# Patient Record
Sex: Female | Born: 1935 | Race: White | Hispanic: No | Marital: Married | Smoking: Never smoker
Health system: Southern US, Community
[De-identification: ages and names within clinical notes are randomized; demographics above are authoritative.]

## PROBLEM LIST (undated history)

## (undated) DIAGNOSIS — S22080A Wedge compression fracture of T11-T12 vertebra, initial encounter for closed fracture: Secondary | ICD-10-CM

## (undated) DIAGNOSIS — L989 Disorder of the skin and subcutaneous tissue, unspecified: Secondary | ICD-10-CM

## (undated) DIAGNOSIS — M8580 Other specified disorders of bone density and structure, unspecified site: Secondary | ICD-10-CM

## (undated) DIAGNOSIS — S7290XA Unspecified fracture of unspecified femur, initial encounter for closed fracture: Secondary | ICD-10-CM

## (undated) DIAGNOSIS — N289 Disorder of kidney and ureter, unspecified: Secondary | ICD-10-CM

## (undated) DIAGNOSIS — I639 Cerebral infarction, unspecified: Secondary | ICD-10-CM

## (undated) DIAGNOSIS — K922 Gastrointestinal hemorrhage, unspecified: Secondary | ICD-10-CM

## (undated) DIAGNOSIS — I2510 Atherosclerotic heart disease of native coronary artery without angina pectoris: Secondary | ICD-10-CM

## (undated) DIAGNOSIS — M1990 Unspecified osteoarthritis, unspecified site: Secondary | ICD-10-CM

## (undated) DIAGNOSIS — L8990 Pressure ulcer of unspecified site, unspecified stage: Secondary | ICD-10-CM

## (undated) DIAGNOSIS — I10 Essential (primary) hypertension: Secondary | ICD-10-CM

## (undated) DIAGNOSIS — I255 Ischemic cardiomyopathy: Secondary | ICD-10-CM

## (undated) DIAGNOSIS — E785 Hyperlipidemia, unspecified: Secondary | ICD-10-CM

## (undated) DIAGNOSIS — R1310 Dysphagia, unspecified: Secondary | ICD-10-CM

## (undated) DIAGNOSIS — I671 Cerebral aneurysm, nonruptured: Secondary | ICD-10-CM

## (undated) DIAGNOSIS — F0280 Dementia in other diseases classified elsewhere without behavioral disturbance: Secondary | ICD-10-CM

## (undated) DIAGNOSIS — R350 Frequency of micturition: Secondary | ICD-10-CM

## (undated) DIAGNOSIS — E119 Type 2 diabetes mellitus without complications: Secondary | ICD-10-CM

## (undated) DIAGNOSIS — M6281 Muscle weakness (generalized): Secondary | ICD-10-CM

## (undated) DIAGNOSIS — R569 Unspecified convulsions: Secondary | ICD-10-CM

## (undated) DIAGNOSIS — I8290 Acute embolism and thrombosis of unspecified vein: Secondary | ICD-10-CM

## (undated) DIAGNOSIS — I5042 Chronic combined systolic (congestive) and diastolic (congestive) heart failure: Secondary | ICD-10-CM

## (undated) DIAGNOSIS — H539 Unspecified visual disturbance: Secondary | ICD-10-CM

## (undated) DIAGNOSIS — K5730 Diverticulosis of large intestine without perforation or abscess without bleeding: Secondary | ICD-10-CM

## (undated) DIAGNOSIS — G4730 Sleep apnea, unspecified: Secondary | ICD-10-CM

## (undated) HISTORY — DX: Wedge compression fracture of T11-T12 vertebra, initial encounter for closed fracture: S22.080A

## (undated) HISTORY — DX: Diverticulosis of large intestine without perforation or abscess without bleeding: K57.30

## (undated) HISTORY — DX: Other specified disorders of bone density and structure, unspecified site: M85.80

## (undated) HISTORY — PX: DOPPLER ECHOCARDIOGRAPHY: SHX263

## (undated) HISTORY — DX: Unspecified visual disturbance: H53.9

## (undated) HISTORY — DX: Ischemic cardiomyopathy: I25.5

## (undated) HISTORY — DX: Unspecified osteoarthritis, unspecified site: M19.90

## (undated) HISTORY — PX: CARDIAC CATHETERIZATION: SHX172

## (undated) HISTORY — DX: Atherosclerotic heart disease of native coronary artery without angina pectoris: I25.10

## (undated) HISTORY — PX: THROMBECTOMY: PRO61

## (undated) HISTORY — DX: Acute embolism and thrombosis of unspecified vein: I82.90

## (undated) HISTORY — PX: OTHER SURGICAL HISTORY: SHX169

## (undated) HISTORY — DX: Type 2 diabetes mellitus without complications: E11.9

## (undated) HISTORY — DX: Sleep apnea, unspecified: G47.30

## (undated) HISTORY — DX: Essential (primary) hypertension: I10

## (undated) HISTORY — PX: TONSILLECTOMY: SUR1361

## (undated) HISTORY — DX: Disorder of the skin and subcutaneous tissue, unspecified: L98.9

## (undated) HISTORY — PX: APPENDECTOMY: SHX54

## (undated) HISTORY — DX: Hyperlipidemia, unspecified: E78.5

---

## 1978-08-26 HISTORY — PX: ABDOMINAL HYSTERECTOMY: SHX81

## 1998-03-15 ENCOUNTER — Other Ambulatory Visit: Admission: RE | Admit: 1998-03-15 | Discharge: 1998-03-15 | Payer: Self-pay | Admitting: Family Medicine

## 1998-03-17 ENCOUNTER — Other Ambulatory Visit: Admission: RE | Admit: 1998-03-17 | Discharge: 1998-03-17 | Payer: Self-pay | Admitting: Family Medicine

## 1998-10-27 ENCOUNTER — Emergency Department (HOSPITAL_COMMUNITY): Admission: EM | Admit: 1998-10-27 | Discharge: 1998-10-27 | Payer: Self-pay | Admitting: Emergency Medicine

## 1998-10-27 ENCOUNTER — Encounter: Payer: Self-pay | Admitting: Emergency Medicine

## 1999-10-23 ENCOUNTER — Other Ambulatory Visit: Admission: RE | Admit: 1999-10-23 | Discharge: 1999-10-23 | Payer: Self-pay | Admitting: Family Medicine

## 2000-05-27 ENCOUNTER — Emergency Department (HOSPITAL_COMMUNITY): Admission: EM | Admit: 2000-05-27 | Discharge: 2000-05-27 | Payer: Self-pay | Admitting: Emergency Medicine

## 2000-05-27 ENCOUNTER — Encounter: Payer: Self-pay | Admitting: Emergency Medicine

## 2000-08-26 DIAGNOSIS — I639 Cerebral infarction, unspecified: Secondary | ICD-10-CM

## 2000-08-26 HISTORY — DX: Cerebral infarction, unspecified: I63.9

## 2000-10-02 ENCOUNTER — Encounter (INDEPENDENT_AMBULATORY_CARE_PROVIDER_SITE_OTHER): Payer: Self-pay | Admitting: Specialist

## 2000-10-02 ENCOUNTER — Encounter: Payer: Self-pay | Admitting: Emergency Medicine

## 2000-10-02 ENCOUNTER — Inpatient Hospital Stay (HOSPITAL_COMMUNITY): Admission: EM | Admit: 2000-10-02 | Discharge: 2000-10-03 | Payer: Self-pay | Admitting: Emergency Medicine

## 2001-06-02 ENCOUNTER — Encounter: Payer: Self-pay | Admitting: Neurology

## 2001-06-02 ENCOUNTER — Encounter: Payer: Self-pay | Admitting: Emergency Medicine

## 2001-06-02 ENCOUNTER — Inpatient Hospital Stay (HOSPITAL_COMMUNITY): Admission: EM | Admit: 2001-06-02 | Discharge: 2001-06-04 | Payer: Self-pay | Admitting: Emergency Medicine

## 2001-06-08 ENCOUNTER — Encounter: Payer: Self-pay | Admitting: Emergency Medicine

## 2001-06-08 ENCOUNTER — Emergency Department (HOSPITAL_COMMUNITY): Admission: EM | Admit: 2001-06-08 | Discharge: 2001-06-08 | Payer: Self-pay | Admitting: Emergency Medicine

## 2003-04-26 ENCOUNTER — Encounter: Admission: RE | Admit: 2003-04-26 | Discharge: 2003-07-25 | Payer: Self-pay | Admitting: Endocrinology

## 2003-06-02 ENCOUNTER — Encounter: Payer: Self-pay | Admitting: Endocrinology

## 2003-06-02 ENCOUNTER — Encounter: Admission: RE | Admit: 2003-06-02 | Discharge: 2003-06-02 | Payer: Self-pay | Admitting: Endocrinology

## 2003-09-07 ENCOUNTER — Encounter (HOSPITAL_BASED_OUTPATIENT_CLINIC_OR_DEPARTMENT_OTHER): Admission: RE | Admit: 2003-09-07 | Discharge: 2003-12-06 | Payer: Self-pay | Admitting: Internal Medicine

## 2003-12-07 ENCOUNTER — Encounter (HOSPITAL_BASED_OUTPATIENT_CLINIC_OR_DEPARTMENT_OTHER): Admission: RE | Admit: 2003-12-07 | Discharge: 2003-12-20 | Payer: Self-pay | Admitting: Internal Medicine

## 2004-08-06 ENCOUNTER — Ambulatory Visit: Payer: Self-pay | Admitting: Internal Medicine

## 2005-03-27 ENCOUNTER — Inpatient Hospital Stay (HOSPITAL_COMMUNITY): Admission: AD | Admit: 2005-03-27 | Discharge: 2005-03-29 | Payer: Self-pay | Admitting: Internal Medicine

## 2005-03-27 ENCOUNTER — Ambulatory Visit: Payer: Self-pay | Admitting: Internal Medicine

## 2005-04-09 ENCOUNTER — Ambulatory Visit: Payer: Self-pay | Admitting: Internal Medicine

## 2005-04-17 ENCOUNTER — Ambulatory Visit: Payer: Self-pay | Admitting: Family Medicine

## 2005-05-21 ENCOUNTER — Ambulatory Visit: Payer: Self-pay | Admitting: Internal Medicine

## 2005-07-15 ENCOUNTER — Ambulatory Visit: Payer: Self-pay | Admitting: Internal Medicine

## 2005-10-10 ENCOUNTER — Ambulatory Visit (HOSPITAL_BASED_OUTPATIENT_CLINIC_OR_DEPARTMENT_OTHER): Admission: RE | Admit: 2005-10-10 | Discharge: 2005-10-11 | Payer: Self-pay | Admitting: Orthopaedic Surgery

## 2005-10-15 ENCOUNTER — Ambulatory Visit: Payer: Self-pay | Admitting: Internal Medicine

## 2005-10-24 ENCOUNTER — Encounter (HOSPITAL_COMMUNITY): Admission: RE | Admit: 2005-10-24 | Discharge: 2005-11-23 | Payer: Self-pay | Admitting: Orthopaedic Surgery

## 2005-11-13 ENCOUNTER — Ambulatory Visit (HOSPITAL_COMMUNITY): Admission: RE | Admit: 2005-11-13 | Discharge: 2005-11-13 | Payer: Self-pay | Admitting: Gastroenterology

## 2005-11-25 ENCOUNTER — Encounter (HOSPITAL_COMMUNITY): Admission: RE | Admit: 2005-11-25 | Discharge: 2005-12-25 | Payer: Self-pay | Admitting: Orthopaedic Surgery

## 2005-12-26 ENCOUNTER — Encounter (HOSPITAL_COMMUNITY): Admission: RE | Admit: 2005-12-26 | Discharge: 2006-01-25 | Payer: Self-pay | Admitting: Orthopaedic Surgery

## 2007-04-25 ENCOUNTER — Encounter: Payer: Self-pay | Admitting: Internal Medicine

## 2007-04-25 DIAGNOSIS — I10 Essential (primary) hypertension: Secondary | ICD-10-CM

## 2007-04-25 DIAGNOSIS — M949 Disorder of cartilage, unspecified: Secondary | ICD-10-CM

## 2007-04-25 DIAGNOSIS — M899 Disorder of bone, unspecified: Secondary | ICD-10-CM | POA: Insufficient documentation

## 2007-04-25 DIAGNOSIS — Z8719 Personal history of other diseases of the digestive system: Secondary | ICD-10-CM | POA: Insufficient documentation

## 2007-04-25 DIAGNOSIS — K5730 Diverticulosis of large intestine without perforation or abscess without bleeding: Secondary | ICD-10-CM | POA: Insufficient documentation

## 2007-04-25 DIAGNOSIS — M1990 Unspecified osteoarthritis, unspecified site: Secondary | ICD-10-CM | POA: Insufficient documentation

## 2007-07-14 ENCOUNTER — Encounter: Payer: Self-pay | Admitting: Internal Medicine

## 2007-09-10 ENCOUNTER — Encounter: Payer: Self-pay | Admitting: Internal Medicine

## 2007-11-24 ENCOUNTER — Encounter: Payer: Self-pay | Admitting: Internal Medicine

## 2008-01-07 ENCOUNTER — Ambulatory Visit: Payer: Self-pay | Admitting: Internal Medicine

## 2008-01-07 DIAGNOSIS — R5381 Other malaise: Secondary | ICD-10-CM

## 2008-01-07 DIAGNOSIS — R252 Cramp and spasm: Secondary | ICD-10-CM

## 2008-01-07 DIAGNOSIS — E119 Type 2 diabetes mellitus without complications: Secondary | ICD-10-CM | POA: Insufficient documentation

## 2008-01-07 DIAGNOSIS — E785 Hyperlipidemia, unspecified: Secondary | ICD-10-CM

## 2008-01-07 DIAGNOSIS — R5383 Other fatigue: Secondary | ICD-10-CM

## 2008-02-17 ENCOUNTER — Encounter: Payer: Self-pay | Admitting: Internal Medicine

## 2008-04-12 ENCOUNTER — Telehealth: Payer: Self-pay | Admitting: Internal Medicine

## 2008-10-06 ENCOUNTER — Encounter (INDEPENDENT_AMBULATORY_CARE_PROVIDER_SITE_OTHER): Payer: Self-pay | Admitting: *Deleted

## 2009-01-17 ENCOUNTER — Ambulatory Visit: Payer: Self-pay | Admitting: Internal Medicine

## 2009-01-25 LAB — CONVERTED CEMR LAB
Bilirubin, Direct: 0.1 mg/dL (ref 0.0–0.3)
Calcium: 9.6 mg/dL (ref 8.4–10.5)
Chloride: 105 meq/L (ref 96–112)
Cholesterol: 208 mg/dL — ABNORMAL HIGH (ref 0–200)
Creatinine, Ser: 0.9 mg/dL (ref 0.4–1.2)
Direct LDL: 118.5 mg/dL
Eosinophils Absolute: 0.1 10*3/uL (ref 0.0–0.7)
Eosinophils Relative: 2.3 % (ref 0.0–5.0)
GFR calc non Af Amer: 63.9 mL/min (ref >60)
Glucose, Bld: 133 mg/dL — ABNORMAL HIGH (ref 70–99)
HCT: 37.8 % (ref 36.0–46.0)
HDL: 56.8 mg/dL (ref >39.00)
Hemoglobin: 13.1 g/dL (ref 12.0–15.0)
Ketones, ur: NEGATIVE mg/dL
Lymphocytes Relative: 28.7 % (ref 12.0–46.0)
Lymphs Abs: 1.5 10*3/uL (ref 0.7–4.0)
MCHC: 34.6 g/dL (ref 30.0–36.0)
MCV: 89.9 fL (ref 78.0–100.0)
Neutro Abs: 3.2 10*3/uL (ref 1.4–7.7)
Nitrite: NEGATIVE
Platelets: 203 10*3/uL (ref 150.0–400.0)
Potassium: 3.7 meq/L (ref 3.5–5.1)
RBC: 4.21 M/uL (ref 3.87–5.11)
Specific Gravity, Urine: 1.01 (ref 1.000–1.030)
Total Bilirubin: 1 mg/dL (ref 0.3–1.2)
Total CHOL/HDL Ratio: 4
Total Protein, Urine: NEGATIVE mg/dL
Total Protein: 6.9 g/dL (ref 6.0–8.3)
Urobilinogen, UA: 0.2 (ref 0.0–1.0)
pH: 6.5 (ref 5.0–8.0)

## 2009-02-24 ENCOUNTER — Ambulatory Visit: Payer: Self-pay | Admitting: Internal Medicine

## 2009-04-17 ENCOUNTER — Ambulatory Visit: Payer: Self-pay | Admitting: Internal Medicine

## 2009-04-17 DIAGNOSIS — R05 Cough: Secondary | ICD-10-CM

## 2009-05-22 ENCOUNTER — Ambulatory Visit: Payer: Self-pay | Admitting: Internal Medicine

## 2009-05-31 ENCOUNTER — Ambulatory Visit: Payer: Self-pay | Admitting: Internal Medicine

## 2009-05-31 LAB — CONVERTED CEMR LAB
Basophils Absolute: 0.1 10*3/uL (ref 0.0–0.1)
CO2: 28 meq/L (ref 19–32)
Creatinine, Ser: 1 mg/dL (ref 0.4–1.2)
GFR calc non Af Amer: 56.54 mL/min (ref >60)
HCT: 37.3 % (ref 36.0–46.0)
Lymphocytes Relative: 28.2 % (ref 12.0–46.0)
MCV: 90.7 fL (ref 78.0–100.0)
Neutrophils Relative %: 57.7 % (ref 43.0–77.0)
Platelets: 211 10*3/uL (ref 150.0–400.0)
Potassium: 4.1 meq/L (ref 3.5–5.1)
RDW: 12.8 % (ref 11.5–14.6)
Sodium: 142 meq/L (ref 135–145)
WBC: 7.3 10*3/uL (ref 4.5–10.5)

## 2009-06-05 ENCOUNTER — Encounter: Payer: Self-pay | Admitting: Internal Medicine

## 2009-07-10 ENCOUNTER — Encounter: Payer: Self-pay | Admitting: Internal Medicine

## 2009-12-19 ENCOUNTER — Telehealth: Payer: Self-pay | Admitting: Internal Medicine

## 2009-12-29 ENCOUNTER — Encounter (INDEPENDENT_AMBULATORY_CARE_PROVIDER_SITE_OTHER): Payer: Self-pay | Admitting: *Deleted

## 2010-01-31 ENCOUNTER — Telehealth: Payer: Self-pay | Admitting: Internal Medicine

## 2010-03-19 ENCOUNTER — Ambulatory Visit: Payer: Self-pay | Admitting: Internal Medicine

## 2010-03-19 DIAGNOSIS — R079 Chest pain, unspecified: Secondary | ICD-10-CM

## 2010-03-19 DIAGNOSIS — R209 Unspecified disturbances of skin sensation: Secondary | ICD-10-CM

## 2010-03-20 ENCOUNTER — Ambulatory Visit: Payer: Self-pay | Admitting: Internal Medicine

## 2010-03-20 ENCOUNTER — Telehealth: Payer: Self-pay | Admitting: Internal Medicine

## 2010-03-20 DIAGNOSIS — E538 Deficiency of other specified B group vitamins: Secondary | ICD-10-CM

## 2010-03-20 LAB — CONVERTED CEMR LAB
ALT: 17 units/L (ref 0–35)
AST: 17 units/L (ref 0–37)
Albumin: 3.8 g/dL (ref 3.5–5.2)
Alkaline Phosphatase: 68 units/L (ref 39–117)
Basophils Relative: 0.8 % (ref 0.0–3.0)
Calcium: 9.3 mg/dL (ref 8.4–10.5)
Creatinine, Ser: 0.7 mg/dL (ref 0.4–1.2)
Eosinophils Absolute: 0.1 10*3/uL (ref 0.0–0.7)
GFR calc non Af Amer: 79.87 mL/min (ref >60)
Glucose, Bld: 138 mg/dL — ABNORMAL HIGH (ref 70–99)
HDL: 64.4 mg/dL (ref >39.00)
MCHC: 34.4 g/dL (ref 30.0–36.0)
Monocytes Relative: 9.9 % (ref 3.0–12.0)
Neutro Abs: 3.7 10*3/uL (ref 1.4–7.7)
Nitrite: NEGATIVE
Platelets: 198 10*3/uL (ref 150.0–400.0)
RBC: 4.22 M/uL (ref 3.87–5.11)
RDW: 13.5 % (ref 11.5–14.6)
Sodium: 138 meq/L (ref 135–145)
Specific Gravity, Urine: >=1.03 (ref 1.000–1.030)
Triglycerides: 141 mg/dL (ref 0.0–149.0)
Uric Acid, Serum: 4.5 mg/dL (ref 2.4–7.0)
Vitamin B-12: 161 pg/mL — ABNORMAL LOW (ref 211–911)
WBC: 6.4 10*3/uL (ref 4.5–10.5)

## 2010-03-21 ENCOUNTER — Encounter (INDEPENDENT_AMBULATORY_CARE_PROVIDER_SITE_OTHER): Payer: Self-pay | Admitting: *Deleted

## 2010-03-21 ENCOUNTER — Telehealth (INDEPENDENT_AMBULATORY_CARE_PROVIDER_SITE_OTHER): Payer: Self-pay | Admitting: *Deleted

## 2010-03-22 ENCOUNTER — Ambulatory Visit: Payer: Self-pay

## 2010-03-22 ENCOUNTER — Ambulatory Visit: Payer: Self-pay | Admitting: Cardiology

## 2010-03-22 ENCOUNTER — Encounter: Payer: Self-pay | Admitting: Cardiology

## 2010-03-22 ENCOUNTER — Encounter (HOSPITAL_COMMUNITY): Admission: RE | Admit: 2010-03-22 | Discharge: 2010-06-20 | Payer: Self-pay | Admitting: Internal Medicine

## 2010-03-23 ENCOUNTER — Ambulatory Visit: Payer: Self-pay | Admitting: Internal Medicine

## 2010-03-30 ENCOUNTER — Encounter: Payer: Self-pay | Admitting: Internal Medicine

## 2010-04-02 ENCOUNTER — Encounter: Payer: Self-pay | Admitting: Internal Medicine

## 2010-04-04 ENCOUNTER — Telehealth: Payer: Self-pay | Admitting: Internal Medicine

## 2010-04-04 ENCOUNTER — Ambulatory Visit: Payer: Self-pay | Admitting: Internal Medicine

## 2010-04-06 ENCOUNTER — Ambulatory Visit: Payer: Self-pay | Admitting: Internal Medicine

## 2010-04-06 ENCOUNTER — Telehealth: Payer: Self-pay | Admitting: Internal Medicine

## 2010-04-09 ENCOUNTER — Ambulatory Visit: Payer: Self-pay | Admitting: Internal Medicine

## 2010-04-12 ENCOUNTER — Ambulatory Visit: Payer: Self-pay | Admitting: Internal Medicine

## 2010-04-16 ENCOUNTER — Ambulatory Visit: Payer: Self-pay | Admitting: Internal Medicine

## 2010-04-20 ENCOUNTER — Ambulatory Visit: Payer: Self-pay | Admitting: Internal Medicine

## 2010-05-03 ENCOUNTER — Encounter (INDEPENDENT_AMBULATORY_CARE_PROVIDER_SITE_OTHER): Payer: Self-pay | Admitting: *Deleted

## 2010-05-03 ENCOUNTER — Ambulatory Visit: Payer: Self-pay | Admitting: Internal Medicine

## 2010-05-10 ENCOUNTER — Ambulatory Visit: Payer: Self-pay | Admitting: Internal Medicine

## 2010-05-10 ENCOUNTER — Inpatient Hospital Stay (HOSPITAL_COMMUNITY): Admission: EM | Admit: 2010-05-10 | Discharge: 2010-05-18 | Payer: Self-pay | Admitting: Emergency Medicine

## 2010-05-11 ENCOUNTER — Encounter (INDEPENDENT_AMBULATORY_CARE_PROVIDER_SITE_OTHER): Payer: Self-pay | Admitting: Cardiovascular Disease

## 2010-05-11 HISTORY — PX: CORONARY ANGIOPLASTY WITH STENT PLACEMENT: SHX49

## 2010-05-17 ENCOUNTER — Encounter (INDEPENDENT_AMBULATORY_CARE_PROVIDER_SITE_OTHER): Payer: Self-pay | Admitting: Cardiology

## 2010-05-20 ENCOUNTER — Emergency Department (HOSPITAL_COMMUNITY): Admission: EM | Admit: 2010-05-20 | Discharge: 2010-05-20 | Payer: Self-pay | Admitting: Emergency Medicine

## 2010-05-28 ENCOUNTER — Ambulatory Visit: Payer: Self-pay | Admitting: Internal Medicine

## 2010-05-28 DIAGNOSIS — R1110 Vomiting, unspecified: Secondary | ICD-10-CM

## 2010-05-29 ENCOUNTER — Encounter: Payer: Self-pay | Admitting: Internal Medicine

## 2010-07-02 ENCOUNTER — Encounter: Payer: Self-pay | Admitting: Internal Medicine

## 2010-07-14 ENCOUNTER — Emergency Department (HOSPITAL_COMMUNITY): Admission: EM | Admit: 2010-07-14 | Discharge: 2010-07-14 | Payer: Self-pay | Admitting: Emergency Medicine

## 2010-07-17 ENCOUNTER — Ambulatory Visit: Payer: Self-pay | Admitting: Internal Medicine

## 2010-07-23 ENCOUNTER — Encounter: Payer: Self-pay | Admitting: Internal Medicine

## 2010-07-24 ENCOUNTER — Ambulatory Visit: Payer: Self-pay | Admitting: Internal Medicine

## 2010-07-24 LAB — CONVERTED CEMR LAB: Blood Glucose, Fingerstick: 181

## 2010-08-07 ENCOUNTER — Telehealth: Payer: Self-pay | Admitting: Internal Medicine

## 2010-08-23 ENCOUNTER — Encounter: Payer: Self-pay | Admitting: Internal Medicine

## 2010-09-25 NOTE — Letter (Signed)
Summary: Generic Letter  [facility]  [address] [place], [place] [address]   Phone: [phone]  Fax: [phone]    05/29/2010  RE: SILAS SEDAM [address] [place], [place]  [address]  Isabelle Course! I hope it is OK with you that I stopped Plavix and started Effient on Ms. Worley - she seems to have  a lot of n/v from Plavix.   Thank you!        Sincerely,   Jacinta Shoe MD

## 2010-09-25 NOTE — Assessment & Plan Note (Signed)
Summary: b12/SD   Nurse Visit   Allergies: 1)  Lisinopril (Lisinopril)  Medication Administration  Injection # 1:    Medication: Vit [name] 1000 mcg    Diagnosis: VITAMIN [name] DEFICIENCY (ICD-266.2)    Route: IM    Site: L deltoid    Exp Date: 11/25/2011    Lot #: [id number]    Mfr: [employer]    Patient tolerated injection without complications    Given by: Lanier Prude, CMA(AAMA) (March 23, 2010 9:47 AM)  Orders Added: 1)  Admin of Therapeutic Inj  intramuscular or subcutaneous [96372] 2)  Vit [name] 1000 mcg [J3420]

## 2010-09-25 NOTE — Progress Notes (Signed)
Summary: Nuclear Pre-Procedure  Phone Note Outgoing Call Call back at [facility] Phone [phone]   Call placed by: Stanton Kidney, EMT-P,  March 21, 2010 1:51 PM Action Taken: Phone Call Completed Summary of Call: Left message with information on Myoview Information Sheet (see scanned document for details).     Nuclear Med Background Indications for Stress Test: Evaluation for Ischemia     Symptoms: Chest Pain    Nuclear Pre-Procedure Cardiac Risk Factors: Hypertension, Lipids, NIDDM Height (in): 65

## 2010-09-25 NOTE — Assessment & Plan Note (Signed)
Summary: FU/ MAY NEED LABS AFTER APPT /NWS  #   Vital Signs:  Patient profile:   77 year old female Height:      65 inches Weight:      193 pounds BMI:     32.23 O2 Sat:      93 % on Room air Temp:     98.4 degrees F oral Pulse rate:   70 / minute Pulse rhythm:   regular Resp:     14 per minute BP sitting:   140 / 68  (left arm) Cuff size:   regular  Vitals Entered By: Lanier Prude, CMA(AAMA) (March 19, 2010 9:13 AM)  O2 Flow:  Room air CC: f/u Is Patient Diabetic? Yes   Primary Care Provider:  Tresa Garter MD  CC:  f/u.  History of Present Illness: The patient presents for a follow up of hypertension, diabetes, hyperlipidemia. C/o neck pain x long time C/o CP The patient presents for a wellness examination  Patient past medical history, social history, and family history reviewed in detail no significant changes.  Patient is physically active. Depression is negative and mood is good. Hearing is normal, and able to perform activities of daily living. Risk of falling is negligible and home safety has been reviewed and is appropriate. Patient has normal height, weight, and visual acuity. Patient has been counseled on age-appropriate routine health concerns for screening and prevention. Education, counseling done.    Current Medications (verified): 1)  Norvasc 10 Mg  Tabs (Amlodipine Besylate) .... Once Daily 2)  Prilosec 20 Mg  Cpdr (Omeprazole) .... Once Daily 3)  Glucophage Xr 500 Mg  Tb24 (Metformin Hcl) .... 2 Tabsonce Daily 4)  Glyburide 5 Mg  Tabs (Glyburide) .Marland Kitchen.. 1 By Mouth Qd 5)  Freestyle Lite Test   Strp (Glucose Blood) .... Check Once Daily Prn 6)  Freestyle Unistick Ii Lancets   Misc (Lancets) .... Once Daily Prn 7)  Meclizine Hcl 12.5 Mg Tabs (Meclizine Hcl) .Marland Kitchen.. 1-2 By Mouth Qid As Needed Dizziness 8)  Bayer Low Strength 81 Mg  Tbec (Aspirin) .... Once Daily 9)  Fluticasone Propionate 50 Mcg/act  Susp (Fluticasone Propionate) .... 2 Sprays Each Nostril  Once Daily 10)  Advair Diskus 250-50 Mcg/dose Aepb (Fluticasone-Salmeterol) .Marland Kitchen.. 1 Inh Bid 11)  Promethazine-Codeine 6.25-10 Mg/71ml Syrp (Promethazine-Codeine) .... 5-10 Ml By Mouth Q Id As Needed Cough  Allergies (verified): 1)  Lisinopril (Lisinopril)  Past History:  Past Medical History: Last updated: 01/07/2008 Hypertension Osteoarthritis Osteopenia Diverticulosis, colon Diabetes mellitus, type II Hyperlipidemia  Past Surgical History: Last updated: 04/25/2007 Hysterectomy  1980?  no bso  Family History: Last updated: 01/07/2008 Family History Hypertension  Social History: Last updated: 05/22/2009 Retired Married Never Smoked, but worked in [employer] Alcohol use-no Drug use-no Regular exercise-no  Review of Systems       The patient complains of difficulty walking.  The patient denies weight gain, chest pain, syncope, abdominal pain, melena, anorexia, fever, weight loss, vision loss, decreased hearing, hoarseness, dyspnea on exertion, peripheral edema, prolonged cough, headaches, hemoptysis, hematochezia, severe indigestion/heartburn, hematuria, incontinence, genital sores, muscle weakness, suspicious skin lesions, transient blindness, depression, unusual weight change, abnormal bleeding, enlarged lymph nodes, angioedema, and breast masses.         Lost wt on diet. Tired  Physical Exam  General:  NAD overweight-appearing.   Head:  Normocephalic and atraumatic without obvious abnormalities. No apparent alopecia or balding. Eyes:  No corneal or conjunctival inflammation noted. EOMI. Perrla.  Ears:  External ear exam shows no significant lesions or deformities.  Otoscopic examination reveals clear canals, tympanic membranes are intact bilaterally without bulging, retraction, inflammation or discharge. Hearing is grossly normal bilaterally. Nose:  External nasal examination shows no deformity or inflammation. Nasal mucosa are pink and moist without lesions or  exudates. Mouth:  Oral mucosa and oropharynx without lesions or exudates.  Teeth in good repair. Neck:  No deformities, masses, or tenderness noted. Lungs:  Normal respiratory effort, chest expands symmetrically. Lungs are clear to auscultation, no crackles or wheezes. Heart:  Normal rate and regular rhythm. S1 and S2 normal without gallop, murmur, click, rub or other extra sounds. Abdomen:  Bowel sounds positive,abdomen soft and non-tender without masses, organomegaly or hernias noted. Msk:  WNL Pulses:  R and L carotid,radial,femoral,dorsalis pedis and posterior tibial pulses are full and equal bilaterally Neurologic:  No cranial nerve deficits noted. Station and gait are normal. Plantar reflexes are down-going bilaterally. DTRs are symmetrical throughout. Sensory, motor and coordinative functions appear intact. Skin:  Intact without suspicious lesions or rashes Psych:  Cognition and judgment appear intact. Alert and cooperative with normal attention span and concentration. No apparent delusions, illusions, hallucinations   Impression & Recommendations:  Problem # 1:  PHYSICAL EXAMINATION (ICD-V70.0) Assessment Unchanged  Overall doing well, age appropriate education and counseling updated and referral for appropriate preventive services done unless declined, immunizations up to date or declined, diet counseling done if overweight, urged to quit smoking if smokes, most recent labs reviewed and current ordered if appropriate, ecg reviewed or declined (interpretation per ECG scanned in the EMR if done); information regarding Medicare Preventation requirements given if appropriate.   Orders: EKG w/ Interpretation (93000) nl T-Vitamin D (25-Hydroxy) [phone]) Gastroenterology Referral (GI) colon Radiology Referral (Radiology) TLB-BMP (Basic Metabolic Panel-BMET) (80048-METABOL) TLB-[name], Serum-Total ONLY (29562-Z30) TLB-CBC Platelet - w/Differential (85025-CBCD) TLB-Hepatic/Liver  Function Pnl (80076-HEPATIC) TLB-Lipid Panel (80061-LIPID) TLB-Sedimentation Rate (ESR) (85652-ESR) TLB-TSH (Thyroid Stimulating Hormone) (84443-TSH) TLB-Uric Acid, Blood (84550-URIC) TLB-Udip ONLY (81003-UDIP)  Problem # 2:  DIABETES MELLITUS, TYPE II (ICD-250.00) Assessment: Comment Only  Her updated medication list for this problem includes:    Glucophage Xr 500 Mg Tb24 (Metformin hcl) .Marland Kitchen... 2 tabsonce daily    Glyburide 5 Mg Tabs (Glyburide) .Marland Kitchen... 1 by mouth qd    Bayer Low Strength 81 Mg Tbec (Aspirin) ..... Once daily  Orders: T-Vitamin D (25-Hydroxy) [phone]) TLB-[name], Serum-Total ONLY (95284-X32) TLB-CBC Platelet - w/Differential (85025-CBCD) TLB-Hepatic/Liver Function Pnl (80076-HEPATIC) TLB-Lipid Panel (80061-LIPID) TLB-Sedimentation Rate (ESR) (85652-ESR) TLB-TSH (Thyroid Stimulating Hormone) (84443-TSH) TLB-Uric Acid, Blood (84550-URIC) TLB-Udip ONLY (81003-UDIP)  Problem # 3:  HYPERTENSION (ICD-401.9) Assessment: Unchanged  Her updated medication list for this problem includes:    Norvasc 10 Mg Tabs (Amlodipine besylate) ..... Once daily qhs  BP today: 140/68 Prior BP: 134/64 (05/22/2009)  Prior 10 Yr Risk Heart Disease: Not enough information (04/17/2009)  Labs Reviewed: K+: 4.1 (05/31/2009) Creat: : 1.0 (05/31/2009)   Chol: 208 (01/24/2009)   HDL: 56.80 (01/24/2009)   TG: 160.0 (01/24/2009)  Problem # 4:  OSTEOARTHRITIS (ICD-715.90) Assessment: Deteriorated  Her updated medication list for this problem includes:    Bayer Low Strength 81 Mg Tbec (Aspirin) ..... Once daily    Tramadol Hcl 50 Mg Tabs (Tramadol hcl) .Marland Kitchen... 1-2 tabs by mouth two times a day as needed pain  Problem # 5:  PARESTHESIA (ICD-782.0) due to #6 Assessment: New  Orders: T-Vitamin D (25-Hydroxy) [phone]) TLB-[name], Serum-Total ONLY (66440-H47) TLB-CBC Platelet - w/Differential (85025-CBCD) TLB-Hepatic/Liver Function Pnl (80076-HEPATIC) TLB-Lipid  Panel  (80061-LIPID) TLB-Sedimentation Rate (ESR) (85652-ESR) TLB-TSH (Thyroid Stimulating Hormone) (84443-TSH) TLB-Uric Acid, Blood (84550-URIC) TLB-Udip ONLY (81003-UDIP)  Problem # 6:  VITAMIN [name] DEFICIENCY (ICD-266.2) Assessment: New Start shots  Complete Medication List: 1)  Norvasc 10 Mg Tabs (Amlodipine besylate) .... Once daily qhs 2)  Prilosec 20 Mg Cpdr (Omeprazole) .... Once daily 3)  Glucophage Xr 500 Mg Tb24 (Metformin hcl) .... 2 tabsonce daily 4)  Glyburide 5 Mg Tabs (Glyburide) .Marland Kitchen.. 1 by mouth qd 5)  Freestyle Lite Test Strp (Glucose blood) .... Check once daily prn 6)  Freestyle Unistick Ii Lancets Misc (Lancets) .... Once daily prn 7)  Meclizine Hcl 12.5 Mg Tabs (Meclizine hcl) .Marland Kitchen.. 1-2 by mouth qid as needed dizziness 8)  Bayer Low Strength 81 Mg Tbec (Aspirin) .... Once daily 9)  Fluticasone Propionate 50 Mcg/act Susp (Fluticasone propionate) .... 2 sprays each nostril once daily 10)  Tramadol Hcl 50 Mg Tabs (Tramadol hcl) .Marland Kitchen.. 1-2 tabs by mouth two times a day as needed pain 11)  Vitamin B-12 500 Mcg Tabs (Cyanocobalamin) .... 2 by mouth once daily for vitamin b12 deficiency 12)  Cyanocobalamin 1000 Mcg/ml Soln (Cyanocobalamin) .Marland Kitchen.. 1 ml subcutaneously q 2 wks  Other Orders: Pneumococcal Vaccine ([address]) Admin 1st Vaccine (60454) Cardiolite (Cardiolite) First annual wellness visit with prevention plan  [phone])  Patient Instructions: 1)  Please schedule a follow-up appointment in 4 months. 2)  BMP prior to visit, ICD-9: 3)  HbgA1C prior to visit, ICD-9:250.00 Prescriptions: TRAMADOL HCL 50 MG TABS (TRAMADOL HCL) 1-2 tabs by mouth two times a day as needed pain  #60 x 3   Entered and Authorized by:   Tresa Garter MD   Signed by:   Tresa Garter MD on 03/19/2010   Method used:   Print then Give to Patient   RxID:   [id number]    Immunizations Administered:  Pneumonia Vaccine:    Vaccine Type: Pneumovax    Site: left deltoid    Mfr: [employer]     Dose: 0.5 ml    Route: IM    Given by: Lanier Prude, CMA(AAMA)    Exp. Date: 06/20/2011    Lot #: [id number]    VIS given: 03/23/96 version given March 19, 2010.

## 2010-09-25 NOTE — Letter (Signed)
Summary: Handicapped Placard/[facility]  Handicapped Placard/[facility]   Imported By: Sherian Rein 05/07/2010 11:32:28  _____________________________________________________________________  External Attachment:    Type:   Image     Comment:   External Document

## 2010-09-25 NOTE — Progress Notes (Signed)
Summary: REFILL?  Phone Note Call from Patient Call back at [facility] Phone [phone]   Summary of Call: Patient is requesting a call back regarding a refill.  Initial call taken by: Lamar Sprinkles, CMA,  January 31, 2010 1:59 PM  Follow-up for Phone Call        left mess to call office back.................Marland KitchenLamar Sprinkles, CMA  February 01, 2010 10:21 AM   Additional Follow-up for Phone Call Additional follow up Details #1::        Needs refills Additional Follow-up by: Lamar Sprinkles, CMA,  February 02, 2010 9:50 AM    Prescriptions: MECLIZINE HCL 12.5 MG TABS (MECLIZINE HCL) 1-2 by mouth qid as needed dizziness  #60 x 2   Entered by:   Lamar Sprinkles, CMA   Authorized by:   Tresa Garter MD   Signed by:   Lamar Sprinkles, CMA on 02/02/2010   Method used:   Faxed to ...       [facility] (mail-order)             , [place]         Ph: [phone]       Fax: [phone]   RxID:   [id number] GLYBURIDE 5 MG  TABS (GLYBURIDE) 1 by mouth qd  #90 x 3   Entered by:   Lamar Sprinkles, CMA   Authorized by:   Tresa Garter MD   Signed by:   Lamar Sprinkles, CMA on 02/02/2010   Method used:   Faxed to ...       [facility] (mail-order)             , [place]         Ph: [phone]       Fax: [phone]   RxID:   [id number] GLUCOPHAGE XR 500 MG  TB24 (METFORMIN HCL) 2 tabsonce daily  #180 x 3   Entered by:   Lamar Sprinkles, CMA   Authorized by:   Tresa Garter MD   Signed by:   Lamar Sprinkles, CMA on 02/02/2010   Method used:   Faxed to ...       [facility] (mail-order)             , [place]         Ph: [phone]       Fax: [phone]   RxID:   [id number] PRILOSEC 20 MG  CPDR (OMEPRAZOLE) once daily  #90 x 3   Entered by:   Lamar Sprinkles, CMA   Authorized by:   Tresa Garter MD   Signed by:   Lamar Sprinkles, CMA on 02/02/2010   Method used:   Faxed to ...       [facility] (mail-order)             , [place]         Ph: [phone]      Fax: [phone]   RxID:   [id number] NORVASC 10 MG  TABS (AMLODIPINE BESYLATE) once daily  #90 x 3   Entered by:   Lamar Sprinkles, CMA   Authorized by:   Tresa Garter MD   Signed by:   Lamar Sprinkles, CMA on 02/02/2010   Method used:   Faxed to ...       [facility] (mail-order)             , [place]         Ph: [phone]  Fax: [phone]   RxID:   [id number]

## 2010-09-25 NOTE — Assessment & Plan Note (Signed)
Summary: [name] / AVP /NWS   Nurse Visit   Allergies: 1)  Lisinopril (Lisinopril)  Medication Administration  Injection # 1:    Medication: Vit [name] 1000 mcg    Diagnosis: VITAMIN [name] DEFICIENCY (ICD-266.2)    Route: IM    Site: R deltoid    Exp Date: 11/25/2011    Lot #: [id number]    Mfr: [employer]    Patient tolerated injection without complications    Given by: Lamar Sprinkles, CMA (April 20, 2010 10:13 AM)  Orders Added: 1)  Vit [name] 1000 mcg [J3420] 2)  Admin of Therapeutic Inj  intramuscular or subcutaneous [96372]   Medication Administration  Injection # 1:    Medication: Vit [name] 1000 mcg    Diagnosis: VITAMIN [name] DEFICIENCY (ICD-266.2)    Route: IM    Site: R deltoid    Exp Date: 11/25/2011    Lot #: [id number]    Mfr: [employer]    Patient tolerated injection without complications    Given by: Lamar Sprinkles, CMA (April 20, 2010 10:13 AM)  Orders Added: 1)  Vit [name] 1000 mcg [J3420] 2)  Admin of Therapeutic Inj  intramuscular or subcutaneous [27253]

## 2010-09-25 NOTE — Assessment & Plan Note (Signed)
Summary: [name] [place] [name]   Nurse Visit   Allergies: 1)  Lisinopril (Lisinopril)  Medication Administration  Injection # 1:    Medication: Vit [name] 1000 mcg    Diagnosis: VITAMIN [name] DEFICIENCY (ICD-266.2)    Route: IM    Site: L deltoid    Exp Date: 01/25/2012    Lot #: [id number]    Mfr: [employer]    Patient tolerated injection without complications    Given by: Lamar Sprinkles, CMA (May 03, 2010 10:32 AM)  Orders Added: 1)  Vit [name] 1000 mcg [J3420] 2)  Admin of Therapeutic Inj  intramuscular or subcutaneous [96372] 3)  Flu Vaccine 52yrs + MEDICARE PATIENTS [Q2039] 4)  Administration Flu vaccine - MCR [G0008]   Medication Administration  Injection # 1:    Medication: Vit [name] 1000 mcg    Diagnosis: VITAMIN [name] DEFICIENCY (ICD-266.2)    Route: IM    Site: L deltoid    Exp Date: 01/25/2012    Lot #: [id number]    Mfr: [employer]    Patient tolerated injection without complications    Given by: Lamar Sprinkles, CMA (May 03, 2010 10:32 AM)  Orders Added: 1)  Vit [name] 1000 mcg [J3420] 2)  Admin of Therapeutic Inj  intramuscular or subcutaneous [96372] 3)  Flu Vaccine 37yrs + MEDICARE PATIENTS [Q2039] 4)  Administration Flu vaccine - MCR [G0008]    Flu Vaccine Consent Questions     Do you have a history of severe allergic reactions to this vaccine? no    Any prior history of allergic reactions to egg and/or gelatin? no    Do you have a sensitivity to the preservative Thimersol? no    Do you have a past history of Guillan-Barre Syndrome? no    Do you currently have an acute febrile illness? no    Have you ever had a severe reaction to latex? no    Vaccine information given and explained to patient? yes    Are you currently pregnant? no    Lot Number:[id number]   Exp Date:02/23/2011   Site Given  RIGHT Deltoid IMdflu

## 2010-09-25 NOTE — Assessment & Plan Note (Signed)
Summary: [name]/ AVP [name]   Nurse Visit   Allergies: 1)  Lisinopril (Lisinopril)  Medication Administration  Injection # 1:    Medication: Vit [name] 1000 mcg    Diagnosis: VITAMIN [name] DEFICIENCY (ICD-266.2)    Route: IM    Site: R deltoid    Exp Date: 11/25/2011    Lot #: [id number]    Mfr: [employer]    Patient tolerated injection without complications    Given by: Lamar Sprinkles, CMA (April 12, 2010 10:27 AM)  Orders Added: 1)  Admin of Therapeutic Inj  intramuscular or subcutaneous [96372] 2)  Vit [name] 1000 mcg [J3420]   Medication Administration  Injection # 1:    Medication: Vit [name] 1000 mcg    Diagnosis: VITAMIN [name] DEFICIENCY (ICD-266.2)    Route: IM    Site: R deltoid    Exp Date: 11/25/2011    Lot #: [id number]    Mfr: [employer]    Patient tolerated injection without complications    Given by: Lamar Sprinkles, CMA (April 12, 2010 10:27 AM)  Orders Added: 1)  Admin of Therapeutic Inj  intramuscular or subcutaneous [96372] 2)  Vit [name] 1000 mcg [J3420]

## 2010-09-25 NOTE — Assessment & Plan Note (Signed)
Summary: b-12/plot pt/SD   Nurse Visit   Allergies: 1)  Lisinopril (Lisinopril)  Medication Administration  Injection # 1:    Medication: Vit [name] 1000 mcg    Diagnosis: VITAMIN [name] DEFICIENCY (ICD-266.2)    Route: IM    Site: L deltoid    Exp Date: 12/25/2011    Lot #: [id number]    Mfr: [employer]    Patient tolerated injection without complications    Given by: Lamar Sprinkles, CMA (April 04, 2010 9:36 AM)  Orders Added: 1)  Admin of Therapeutic Inj  intramuscular or subcutaneous [96372] 2)  Vit [name] 1000 mcg [J3420]   Medication Administration  Injection # 1:    Medication: Vit [name] 1000 mcg    Diagnosis: VITAMIN [name] DEFICIENCY (ICD-266.2)    Route: IM    Site: L deltoid    Exp Date: 12/25/2011    Lot #: [id number]    Mfr: [employer]    Patient tolerated injection without complications    Given by: Lamar Sprinkles, CMA (April 04, 2010 9:36 AM)  Orders Added: 1)  Admin of Therapeutic Inj  intramuscular or subcutaneous [96372] 2)  Vit [name] 1000 mcg [J3420]

## 2010-09-25 NOTE — Assessment & Plan Note (Signed)
Summary: Cardiology Nuclear Study  Nuclear Med Background Indications for Stress Test: Evaluation for Ischemia   History: Asthma, Emphysema  History Comments: NO DOCUMENTED CAD  Symptoms: Chest Pain, Chest Pain with Exertion, DOE, Palpitations, Rapid HR  Symptoms Comments: Last episode of QI:ONGEXB, 03/19/10.   Nuclear Pre-Procedure Cardiac Risk Factors: CVA, Hypertension, Lipids, NIDDM, Obesity Caffeine/Decaff Intake: None NPO After: 11:00 PM Lungs: Clear IV 0.9% NS with Angio Cath: 22g     IV Site: (L) AC IV Started by: Irean Hong RN Chest Size (in) 42     Height (in): 65 Weight (lb): 192 BMI: 32.07  Nuclear Med Study 1 or 2 day study:  1 day     Stress Test Type:  Stress Reading MD:  Olga Millers, MD     Referring MD:  Sonda Primes, MD Resting Radionuclide:  Technetium 67m Tetrofosmin     Resting Radionuclide Dose:  10.8 mCi  Stress Radionuclide:  Technetium 37m Tetrofosmin     Stress Radionuclide Dose:  33 mCi   Stress Protocol Exercise Time (min):  3:00 min     Max HR:  126 bpm     Predicted Max HR:  139 bpm  Max Systolic BP: 189 mm Hg     Percent Max HR:  90.65 %     METS: 4.8 Rate Pressure Product:  28413    Stress Test Technologist:  Rea [employer] CMA-N     Nuclear Technologist:  Domenic Polite CNMT  Rest Procedure  Myocardial perfusion imaging was performed at rest 45 minutes following the intravenous administration of Myoview Technetium 22m Tetrofosmin.  Stress Procedure  The patient exercised for three minutes.  The patient stopped due to fatigue and denied any chest pain.  There were no diagnostic ST-T wave changes, there were nonspecific ST-T wave changes in recovery and a run of SVT, rate 150-180.  Myoview was injected at peak exercise and myocardial perfusion imaging was performed after a brief delay.  Stress EKG and images were discussed with Dr. Gala Romney prior to patient leaving.  QPS Raw Data Images:  Acuisition technically good; normal left  ventricular size. Stress Images:  There is decreased uptake in the apex. Rest Images:  There is decreased uptake in the apex. Subtraction (SDS):  No evidence of ischemia. Transient Ischemic Dilatation:  1.17  (Normal <1.22)  Lung/Heart Ratio:  .33  (Normal <0.45)  Quantitative Gated Spect Images QGS EDV:  96 ml QGS ESV:  37 ml QGS EF:  61 % QGS cine images:  Normal LV function.   Overall Impression  Exercise Capacity: Poor exercise capacity. BP Response: Normal blood pressure response. Clinical Symptoms: There is dyspnea. ECG Impression: No significant ST segment change suggestive of ischemia; brief run of SVT. Overall Impression: Normal stress nuclear study with apical thinning but no ischemia.

## 2010-09-25 NOTE — Progress Notes (Signed)
Summary: [name] ?  Phone Note Call from Patient   Caller: Patient Summary of Call: pt is asking whether she should cont oral [name] as well as inj? Initial call taken by: Lanier Prude, [facility]),  April 06, 2010 9:41 AM  Follow-up for Phone Call        OK to stop oral [name] now Follow-up by: Tresa Garter MD,  April 06, 2010 12:45 PM  Additional Follow-up for Phone Call Additional follow up Details #1::        Pt informed Additional Follow-up by: Margaret Pyle, CMA,  April 06, 2010 3:07 PM

## 2010-09-25 NOTE — Assessment & Plan Note (Signed)
Summary: CAN'T SLEEP OR EAT SINCE HEART ATTACK/NWS   Vital Signs:  Patient profile:   77 year old female Height:      65 inches Weight:      186 pounds BMI:     31.06 O2 Sat:      95 % on Room air Temp:     97.0 degrees F oral Pulse rate:   52 / minute BP sitting:   122 / 70  (left arm) Cuff size:   regular  Vitals Entered By: Bill Salinas CMA (May 28, 2010 9:46 AM)  O2 Flow:  Room air CC: pt here for evaluation of 3 days history of decreased appetite and diarrhea/ ab Comments  pt not taking meclizine, norvasc or tramadol/ ab   Primary Care [facility] Getter:  Georgina Quint Plotnikov MD  CC:  pt here for evaluation of 3 days history of decreased appetite and diarrhea/ ab.  History of Present Illness: The patient presents for a post-hospital visit for   DISCHARGE DIAGNOSES: 1. ST-elevation myocardial infarction on admission, treated with     Promus stenting to the circumflex with residual 80% diagonal     narrowing and 50% right coronary artery narrowing. 2. Ejection fraction of 40-45% by echocardiogram as well as     catheterization. 3. Treated hypertension. 4. Treated dyslipidemia. 5. Type 2 diabetes.  She went to the [facility] again on Sun a wk ago for CBG>300 and n/v C/o n/v and mild cough. Pt thinks she gets nausea and diarrhea after taking Plavix.  Current Medications (verified): 1)  Norvasc 10 Mg  Tabs (Amlodipine Besylate) .... Once Daily Qhs 2)  Prilosec 20 Mg  Cpdr (Omeprazole) .... Once Daily 3)  Glucophage Xr 500 Mg  Tb24 (Metformin Hcl) .... 2 Tabsonce Daily 4)  Glyburide 5 Mg  Tabs (Glyburide) .Marland Kitchen.. 1 By Mouth Qd 5)  Freestyle Lite Test   Strp (Glucose Blood) .... Check Once Daily Prn 6)  Freestyle Unistick Ii Lancets   Misc (Lancets) .... Once Daily Prn 7)  Meclizine Hcl 12.5 Mg Tabs (Meclizine Hcl) .Marland Kitchen.. 1-2 By Mouth Qid As Needed Dizziness 8)  Bayer Low Strength 81 Mg  Tbec (Aspirin) .... Once Daily 9)  Fluticasone Propionate 50 Mcg/act  Susp (Fluticasone Propionate)  .... 2 Sprays Each Nostril Once Daily 10)  Tramadol Hcl 50 Mg Tabs (Tramadol Hcl) .Marland Kitchen.. 1-2 Tabs By Mouth Two Times A Day As Needed Pain 11)  Vitamin B-12 500 Mcg Tabs (Cyanocobalamin) .... 2 By Mouth Once Daily For Vitamin [name] Deficiency 12)  Cyanocobalamin 1000 Mcg/ml Soln (Cyanocobalamin) .Marland Kitchen.. 1 Ml Subcutaneously Q 2 Wks 13)  Aspirin 325 Mg Tabs (Aspirin) .Marland Kitchen.. 1 Tab Once Daily 14)  Coreg 25 Mg Tabs (Carvedilol) .Marland Kitchen.. 1 Tab Two Times A Day 15)  Plavix 75 Mg Tabs (Clopidogrel Bisulfate) .Marland Kitchen.. 1 Tab Once Daily 16)  Furosemide 40 Mg Tabs (Furosemide) .Marland Kitchen.. 1 Tab Once Daily 17)  Isosorbide Dinitrate 30 Mg Tabs (Isosorbide Dinitrate) .Marland Kitchen.. 1 Tab Once Daily 18)  Nitrostat 0.4 Mg Subl (Nitroglycerin) .Marland Kitchen.. 1 Sublingual As Needed 19)  Protonix 40 Mg Pack (Pantoprazole Sodium) .Marland Kitchen.. 1 Tab Once Daily 20)  Ramipril 5 Mg Caps (Ramipril) .Marland Kitchen.. 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  no bso 2 STENTs 9/11  Review of Systems  The patient denies fever, syncope, dyspnea on exertion, abdominal pain, difficulty walking, and depression.    Physical Exam  General:  NAD overweight-appearing.   Nose:  External nasal examination shows no deformity or inflammation. Nasal mucosa are pink and moist without lesions or exudates. Mouth:  Oral mucosa and oropharynx without lesions or exudates.  Teeth in good repair. Lungs:  Normal respiratory effort, chest expands symmetrically. Lungs are clear to auscultation, no crackles or  wheezes. Heart:  Normal rate and regular rhythm. S1 and S2 normal without gallop, murmur, click, rub or other extra sounds. Abdomen:  Bowel sounds positive,abdomen soft and non-tender without masses, organomegaly or hernias noted. Msk:  WNL Extremities:  trace left pedal edema and trace right pedal edema.   Neurologic:  No cranial nerve deficits noted. Station and gait are normal. Plantar reflexes are down-going bilaterally. DTRs are symmetrical throughout. Sensory, motor and coordinative functions appear intact. Skin:  Intact without suspicious lesions or rashes Cervical Nodes:  No lymphadenopathy noted Psych:  Cognition and judgment appear intact. Alert and cooperative with normal attention span and concentration. No apparent delusions, illusions, hallucinations   Impression & Recommendations:  Problem # 1:  VOMITING  - likely due to Plavix; less likely due to  Ramipril Assessment New  Protonix is new Hold Plavix. Hold Ramipril if not better.  Isosorbide and Coreg are less likely  Problem # 2:  CORONARY ARTERY DISEASE (ICD-414.00) Assessment: New Hosp records reviewed The following medications were removed from the medication list:    Bayer Low Strength 81 Mg Tbec (Aspirin) ..... Once daily Her updated medication list for this problem includes:    Norvasc 10 Mg Tabs (Amlodipine besylate) ..... Once daily qhs    Aspirin 325 Mg Tabs (Aspirin) .Marland Kitchen... 1 tab once daily    Coreg 25 Mg Tabs (Carvedilol) .Marland Kitchen... 1 tab two times a day    Furosemide 40 Mg Tabs (Furosemide) .Marland Kitchen... 1 tab once daily    Isosorbide Dinitrate 30 Mg Tabs (Isosorbide dinitrate) .Marland Kitchen... 1 tab once daily    Nitrostat 0.4 Mg Subl (Nitroglycerin) .Marland Kitchen... 1 sublingual as needed    Ramipril 5 Mg Caps (Ramipril) .Marland Kitchen... 1 cap once daily    Effient 10 Mg Tabs (Prasugrel hcl) .Marland Kitchen... 1 by mouth qd  Problem # 3:  VITAMIN [name] DEFICIENCY (ICD-266.2) Assessment: Improved On the regimen of medicine(s) reflected in the chart    Orders: Admin of Therapeutic Inj  intramuscular or subcutaneous (16109) Vit [name] 1000 mcg (J3420)  Problem # 4:  FATIGUE (ICD-780.79) Assessment: Deteriorated  Problem # 5:  HYPERTENSION (ICD-401.9) Assessment: Unchanged  Her updated medication list for this problem includes:    Norvasc 10 Mg Tabs (Amlodipine besylate) ..... Once daily qhs    Coreg 25 Mg Tabs (Carvedilol) .Marland Kitchen... 1 tab two times a day    Furosemide 40 Mg Tabs (Furosemide) .Marland Kitchen... 1 tab once daily    Ramipril 5 Mg Caps (Ramipril) .Marland Kitchen... 1 cap once daily  BP today: 122/70 Prior BP: 140/68 (03/19/2010)  Prior 10 Yr Risk Heart Disease: Not enough information (04/17/2009)  Labs Reviewed: K+: 3.6 (03/19/2010) Creat: : 0.7 (03/19/2010)   Chol: 253 (03/19/2010)   HDL: 64.40 (03/19/2010)   TG: 141.0 (03/19/2010)  Complete Medication List: 1)  Norvasc 10 Mg Tabs (Amlodipine besylate) .... Once daily qhs 2)  Glucophage Xr 500  Mg Tb24 (Metformin hcl) .... 2 tabsonce daily 3)  Glyburide 5 Mg Tabs (Glyburide) .Marland Kitchen.. 1 by mouth qd 4)  Freestyle Lite Test Strp (Glucose blood) .... Check once daily prn 5)  Freestyle Unistick Ii Lancets Misc (Lancets) .... Once daily prn 6)  Meclizine Hcl 12.5 Mg Tabs (Meclizine hcl) .Marland Kitchen.. 1-2 by mouth qid as needed dizziness 7)  Fluticasone Propionate 50 Mcg/act Susp (Fluticasone propionate) .... 2 sprays each nostril once daily 8)  Tramadol Hcl 50 Mg Tabs (Tramadol hcl) .Marland Kitchen.. 1-2 tabs by mouth two times a day as needed pain 9)  Cyanocobalamin 1000 Mcg/ml Soln (Cyanocobalamin) .Marland Kitchen.. 1 ml subcutaneously q 2 wks 10)  Aspirin 325 Mg Tabs (Aspirin) .Marland Kitchen.. 1 tab once daily 11)  Coreg 25 Mg Tabs (Carvedilol) .Marland Kitchen.. 1 tab two times a day 12)  Furosemide 40 Mg Tabs (Furosemide) .Marland Kitchen.. 1 tab once daily 13)  Isosorbide Dinitrate 30 Mg Tabs (Isosorbide dinitrate) .Marland Kitchen.. 1 tab once daily 14)  Nitrostat 0.4 Mg Subl (Nitroglycerin) .Marland Kitchen.. 1 sublingual as needed 15)  Protonix 40 Mg Pack (Pantoprazole sodium) .Marland Kitchen.. 1 tab once  daily 16)  Ramipril 5 Mg Caps (Ramipril) .Marland Kitchen.. 1 cap once daily 17)  Promethazine Hcl 25 Mg Tabs (Promethazine hcl) .Marland Kitchen.. 1-2 by mouth four times a day as needed nausea 18)  Effient 10 Mg Tabs (Prasugrel hcl) .Marland Kitchen.. 1 by mouth qd  Patient Instructions: 1)  Hold Plavix. Start Effient instead. 2)  Please schedule a follow-up appointment in 1- 2 weeks. 3)  Call if you are not better in a reasonable amount of time or if worse. Go to ER if feeling really bad!  Prescriptions: EFFIENT 10 MG TABS (PRASUGREL HCL) 1 by mouth qd  #30 x 6   Entered and Authorized by:   Tresa Garter MD   Signed by:   Tresa Garter MD on 05/28/2010   Method used:   Print then Give to Patient   RxID:   [id number] PROMETHAZINE HCL 25 MG TABS (PROMETHAZINE HCL) 1-2 by mouth four times a day as needed nausea  #60 x 1   Entered and Authorized by:   Tresa Garter MD   Signed by:   Tresa Garter MD on 05/28/2010   Method used:   Print then Give to Patient   RxID:   [id number]    Medication Administration  Injection # 1:    Medication: Vit [name] 1000 mcg    Diagnosis: VITAMIN [name] DEFICIENCY (ICD-266.2)    Route: IM    Site: R deltoid    Exp Date: 02/24/2012    Lot #: [id number]    Mfr: [employer]    Patient tolerated injection without complications    Given by: Lamar Sprinkles, CMA (May 28, 2010 10:43 AM)  Orders Added: 1)  Est. Patient Level V [84696] 2)  Admin of Therapeutic Inj  intramuscular or subcutaneous [96372] 3)  Vit [name] 1000 mcg [J3420]

## 2010-09-25 NOTE — Assessment & Plan Note (Signed)
Summary: 4 MO ROV /NWS  #   Vital Signs:  Patient profile:   77 year old female Height:      65 inches Weight:      185 pounds BMI:     30.90 Temp:     98.0 degrees F oral Pulse rate:   88 / minute Pulse rhythm:   regular Resp:     16 per minute BP sitting:   108 / 78  (left arm) Cuff size:   regular  Vitals Entered By: Lanier Prude, CMA(AAMA) (July 24, 2010 11:11 AM) CC: 4 mo f/u  Is Patient Diabetic? Yes CBG Result 181 Comments pt needs Rx for Accuchek Aviva test strips and lancets.  She is unsure of name of mail order pharmacy.   Primary Care Provider:  Tresa Garter MD  CC:  4 mo f/u .  History of Present Illness: The patient presents for a follow up of hypertension, diabetes, hyperlipidemia, CAD, Vit [name] def   Current Medications (verified): 1)  Norvasc 10 Mg  Tabs (Amlodipine Besylate) .... Once Daily Qhs 2)  Glucophage Xr 500 Mg  Tb24 (Metformin Hcl) .... 2 Tabsonce Daily 3)  Glyburide 5 Mg  Tabs (Glyburide) .Marland Kitchen.. 1 By Mouth Qd 4)  Freestyle Lite Test   Strp (Glucose Blood) .... Check Once Daily Prn 5)  Freestyle Unistick Ii Lancets   Misc (Lancets) .... Once Daily Prn 6)  Meclizine Hcl 12.5 Mg Tabs (Meclizine Hcl) .Marland Kitchen.. 1-2 By Mouth Qid As Needed Dizziness 7)  Fluticasone Propionate 50 Mcg/act  Susp (Fluticasone Propionate) .... 2 Sprays Each Nostril Once Daily 8)  Tramadol Hcl 50 Mg Tabs (Tramadol Hcl) .Marland Kitchen.. 1-2 Tabs By Mouth Two Times A Day As Needed Pain 9)  Cyanocobalamin 1000 Mcg/ml Soln (Cyanocobalamin) .Marland Kitchen.. 1 Ml Subcutaneously Q 2 Wks 10)  Aspirin 325 Mg Tabs (Aspirin) .Marland Kitchen.. 1 Tab Once Daily 11)  Coreg 25 Mg Tabs (Carvedilol) .Marland Kitchen.. 1 Tab Two Times A Day 12)  Furosemide 40 Mg Tabs (Furosemide) .Marland Kitchen.. 1 Tab Once Daily 13)  Isosorbide Dinitrate 30 Mg Tabs (Isosorbide Dinitrate) .Marland Kitchen.. 1 Tab Once Daily 14)  Nitrostat 0.4 Mg Subl (Nitroglycerin) .Marland Kitchen.. 1 Sublingual As Needed 15)  Protonix 40 Mg Pack (Pantoprazole Sodium) .Marland Kitchen.. 1 Tab Once Daily 16)  Ramipril 5  Mg Caps (Ramipril) .Marland Kitchen.. 1 Cap Once Daily 17)  Promethazine Hcl 25 Mg Tabs (Promethazine Hcl) .Marland Kitchen.. 1-2 By Mouth Four Times A Day As Needed Nausea 18)  Effient 10 Mg Tabs (Prasugrel Hcl) .Marland Kitchen.. 1 By Mouth Qd  Allergies (verified): 1)  Lisinopril (Lisinopril) 2)  Plavix (Clopidogrel Bisulfate)  Past History:  Past Medical History: Last updated: 05/28/2010 Hypertension Osteoarthritis Osteopenia Diverticulosis, colon Diabetes mellitus, type II Hyperlipidemia Coronary artery disease MI 9/22 Dr Tresa Endo  Past Surgical History: Last updated: 05/28/2010 Hysterectomy  1980?  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    Physical Exam  General:  NAD overweight-appearing.   Eyes:  No corneal or conjunctival inflammation noted. EOMI. Perrla.  Nose:  External nasal examination shows no deformity or inflammation. Nasal mucosa are pink and moist without lesions or exudates. Mouth:  Oral mucosa and oropharynx without lesions or exudates.  Teeth in good repair. Neck:  No deformities, masses, or tenderness noted.  Lungs:  Normal respiratory effort, chest expands symmetrically. Lungs are clear to auscultation, no crackles or wheezes. Heart:  Normal rate and regular rhythm. S1 and S2 normal without gallop, murmur, click, rub or other extra sounds. Abdomen:  Bowel sounds positive,abdomen soft and non-tender without masses, organomegaly or hernias noted. Msk:  WNL Neurologic:  No cranial nerve deficits noted. Station and gait are normal. Plantar reflexes are down-going bilaterally. DTRs are symmetrical throughout. Sensory, motor and coordinative functions appear intact. Skin:  Intact without  suspicious lesions or rashes Psych:  Cognition and judgment appear intact. Alert and cooperative with normal attention span and concentration. No apparent delusions, illusions, hallucinations   Impression & Recommendations:  Problem # 1:  CORONARY ARTERY DISEASE (ICD-414.00) Assessment Unchanged  Her updated medication list for this problem includes:    Norvasc 10 Mg Tabs (Amlodipine besylate) ..... Once daily qhs    Aspirin 325 Mg Tabs (Aspirin) .Marland Kitchen... 1 tab once daily    Coreg 25 Mg Tabs (Carvedilol) .Marland Kitchen... 1 tab two times a day    Furosemide 40 Mg Tabs (Furosemide) .Marland Kitchen... 1 tab once daily    Isosorbide Dinitrate 30 Mg Tabs (Isosorbide dinitrate) .Marland Kitchen... 1 tab once daily    Nitrostat 0.4 Mg Subl (Nitroglycerin) .Marland Kitchen... 1 sublingual as needed    Ramipril 5 Mg Caps (Ramipril) .Marland Kitchen... 1 cap once daily    Effient 10 Mg Tabs (Prasugrel hcl) .Marland Kitchen... 1 by mouth qd  Problem # 2:  VOMITING (ICD-787.03) resolved off Plavix Assessment: Improved  Problem # 3:  VITAMIN [name] DEFICIENCY (ICD-266.2) Assessment: Improved On the regimen of medicine(s) reflected in the chart    Problem # 4:  DIABETES MELLITUS, TYPE II (ICD-250.00) Assessment: Comment Only  Her updated medication list for this problem includes:    Glucophage Xr 500 Mg Tb24 (Metformin hcl) .Marland Kitchen... 2 tabsonce daily    Glyburide 5 Mg Tabs (Glyburide) .Marland Kitchen... 1 by mouth qd    Aspirin 325 Mg Tabs (Aspirin) .Marland Kitchen... 1 tab once daily    Ramipril 5 Mg Caps (Ramipril) .Marland Kitchen... 1 cap once daily  Orders: Capillary Blood Glucose/CBG (57846)  Problem # 5:  HYPERTENSION (ICD-401.9) Assessment: New  Her updated medication list for this problem includes:    Norvasc 10 Mg Tabs (Amlodipine besylate) ..... Once daily qhs    Coreg 25 Mg Tabs (Carvedilol) .Marland Kitchen... 1 tab two times a day    Furosemide 40 Mg Tabs (Furosemide) .Marland Kitchen... 1 tab once daily    Ramipril 5 Mg Caps (Ramipril) .Marland Kitchen... 1 cap once daily  BP today: 108/78 Prior BP: 122/70 (05/28/2010)  Prior 10 Yr  Risk Heart Disease: Not enough information (04/17/2009)  Labs Reviewed: K+: 3.6 (03/19/2010) Creat: : 0.7 (03/19/2010)   Chol: 253 (03/19/2010)   HDL: 64.40 (03/19/2010)   TG: 141.0 (03/19/2010)  Complete Medication List: 1)  Norvasc 10 Mg Tabs (Amlodipine besylate) .... Once daily qhs 2)  Glucophage Xr 500 Mg Tb24 (Metformin hcl) .... 2 tabsonce daily 3)  Glyburide 5 Mg Tabs (Glyburide) .Marland Kitchen.. 1 by mouth qd 4)  Freestyle Lite Test Strp (Glucose blood) .... Check once daily prn 5)  Freestyle Unistick Ii Lancets Misc (Lancets) .... Once daily prn 6)  Meclizine Hcl 12.5 Mg Tabs (Meclizine hcl) .Marland Kitchen.. 1-2 by mouth qid as needed dizziness 7)  Fluticasone Propionate 50 Mcg/act Susp (Fluticasone propionate) .... 2 sprays each nostril once daily 8)  Tramadol Hcl 50 Mg Tabs (Tramadol hcl) .Marland Kitchen.. 1-2 tabs by mouth two times a day as needed pain 9)  Cyanocobalamin 1000 Mcg/ml Soln (Cyanocobalamin) .Marland Kitchen.. 1 ml subcutaneously q 2 wks 10)  Aspirin 325 Mg Tabs (Aspirin) .Marland Kitchen.. 1 tab once daily 11)  Coreg 25 Mg Tabs (Carvedilol) .Marland Kitchen.. 1 tab two times a day 12)  Furosemide 40 Mg Tabs (Furosemide) .Marland Kitchen.. 1 tab once daily 13)  Isosorbide Dinitrate 30 Mg Tabs (Isosorbide dinitrate) .Marland Kitchen.. 1 tab once daily 14)  Nitrostat 0.4 Mg Subl (Nitroglycerin) .Marland Kitchen.. 1 sublingual as needed 15)  Protonix 40 Mg Pack (Pantoprazole sodium) .Marland Kitchen.. 1 tab once daily 16)  Ramipril 5 Mg Caps (Ramipril) .Marland Kitchen.. 1 cap once daily 17)  Promethazine Hcl 25 Mg Tabs (Promethazine hcl) .Marland Kitchen.. 1-2 by mouth four times a day as needed nausea 18)  Effient 10 Mg Tabs (Prasugrel hcl) .Marland Kitchen.. 1 by mouth qd 19)  Accu-chek Aviva Strp (Glucose blood) .... Check qd 20)  Accu-chek Softclix Lancets Misc (Lancets) .... Use once daily prn 21)  Citalopram Hydrobromide 20 Mg Tabs (Citalopram hydrobromide) .Marland Kitchen.. 1 by mouth qd  Patient Instructions: 1)  Please schedule a follow-up appointment in 3 months. 2)  BMP prior to visit, ICD-9: 3)  Hepatic Panel prior to visit,  ICD-9: 4)  Vit [name] 266.20 250.00 414.8 5)  Lipid Panel prior to visit, ICD-9: 6)  TSH prior to visit, ICD-9: 7)  CBC w/ Diff prior to visit, ICD-9: Prescriptions: CITALOPRAM HYDROBROMIDE 20 MG TABS (CITALOPRAM HYDROBROMIDE) 1 by mouth qd  #90 x 3   Entered and Authorized by:   Tresa Garter MD   Signed by:   Tresa Garter MD on 07/24/2010   Method used:   Print then Give to Patient   RxID:   [id number] CITALOPRAM HYDROBROMIDE 20 MG TABS (CITALOPRAM HYDROBROMIDE) 1 by mouth qd  #30 x 6   Entered and Authorized by:   Tresa Garter MD   Signed by:   Tresa Garter MD on 07/24/2010   Method used:   Print then Give to Patient   RxID:   [id number] ACCU-CHEK SOFTCLIX LANCETS  MISC (LANCETS) use once daily prn  #100 x 3   Entered and Authorized by:   Tresa Garter MD   Signed by:   Tresa Garter MD on 07/24/2010   Method used:   Print then Give to Patient   RxID:   [id number] ACCU-CHEK AVIVA  STRP (GLUCOSE BLOOD) check qd  #100 x 3   Entered and Authorized by:   Tresa Garter MD   Signed by:   Tresa Garter MD on 07/24/2010   Method used:   Print then Give to Patient   RxID:   [id number]    Orders Added: 1)  Capillary Blood Glucose/CBG [42595] 2)  Est. Patient Level IV [63875]

## 2010-09-25 NOTE — Progress Notes (Signed)
  Phone Note Call from Patient   Summary of Call: sick w/cold Initial call taken by: Tresa Garter MD,  December 19, 2009 4:02 PM  Follow-up for Phone Call        Zpac Prom/cod Follow-up by: Tresa Garter MD,  December 19, 2009 4:02 PM    New/Updated Medications: ZITHROMAX Z-PAK 250 MG TABS (AZITHROMYCIN) as dirrected PROMETHAZINE-CODEINE 6.25-10 MG/5ML SYRP (PROMETHAZINE-CODEINE) 5-10 ml by mouth q id as needed cough Prescriptions: PROMETHAZINE-CODEINE 6.25-10 MG/5ML SYRP (PROMETHAZINE-CODEINE) 5-10 ml by mouth q id as needed cough  #300 ml x 0   Entered and Authorized by:   Tresa Garter MD   Signed by:   Tresa Garter MD on 12/19/2009   Method used:   Print then Give to Patient   RxID:   [id number] ZITHROMAX Z-PAK 250 MG TABS (AZITHROMYCIN) as dirrected  #1 x 0   Entered and Authorized by:   Tresa Garter MD   Signed by:   Tresa Garter MD on 12/19/2009   Method used:   Electronically to        [facility]* (retail)       [address]       [place], [place]  [address]       Ph: [phone]       Fax: [phone]   RxID:   [id number]

## 2010-09-25 NOTE — Assessment & Plan Note (Signed)
Summary: [name] / AVP /NWS   Nurse Visit   Allergies: 1)  Lisinopril (Lisinopril)  Medication Administration  Injection # 1:    Medication: Vit [name] 1000 mcg    Diagnosis: VITAMIN [name] DEFICIENCY (ICD-266.2)    Route: IM    Site: R deltoid    Exp Date: 12/25/2011    Lot #: [id number]    Mfr: [employer]    Patient tolerated injection without complications    Given by: Lamar Sprinkles, CMA (April 09, 2010 12:00 PM)  Orders Added: 1)  Vit [name] 1000 mcg [J3420] 2)  Admin of Therapeutic Inj  intramuscular or subcutaneous [96372]   Medication Administration  Injection # 1:    Medication: Vit [name] 1000 mcg    Diagnosis: VITAMIN [name] DEFICIENCY (ICD-266.2)    Route: IM    Site: R deltoid    Exp Date: 12/25/2011    Lot #: [id number]    Mfr: [employer]    Patient tolerated injection without complications    Given by: Lamar Sprinkles, CMA (April 09, 2010 12:00 PM)  Orders Added: 1)  Vit [name] 1000 mcg [J3420] 2)  Admin of Therapeutic Inj  intramuscular or subcutaneous [93716]

## 2010-09-25 NOTE — Progress Notes (Signed)
Summary: B-12  Phone Note Call from Patient   Summary of Call: Pt was scheduled today for nurse vist to start b12. How often is pt to recieved b12 injections?  Initial call taken by: Lamar Sprinkles, CMA,  March 20, 2010 2:02 PM  Follow-up for Phone Call        start 2/wk  x 1 mo - they can come and get them together w/husband. Take by mouth 1000 micrograms [name] qd Follow-up by: Tresa Garter MD,  March 20, 2010 5:55 PM  Additional Follow-up for Phone Call Additional follow up Details #1::        Pt informed, scheduled for f/u b12 Additional Follow-up by: Lamar Sprinkles, CMA,  March 20, 2010 6:25 PM  New Problems: VITAMIN [name] DEFICIENCY (ICD-266.2)   New Problems: VITAMIN [name] DEFICIENCY (ICD-266.2) New/Updated Medications: VITAMIN B-12 500 MCG TABS (CYANOCOBALAMIN) 2 by mouth once daily for Vitamin [name] deficiency CYANOCOBALAMIN 1000 MCG/ML SOLN (CYANOCOBALAMIN) 1 ml subcutaneously q 2 wks Prescriptions: CYANOCOBALAMIN 1000 MCG/ML SOLN (CYANOCOBALAMIN) 1 ml subcutaneously q 2 wks  #10 ml x 12   Entered and Authorized by:   Tresa Garter MD   Signed by:   Tresa Garter MD on 03/20/2010   Method used:   Print then Give to Patient   RxID:   [id number] VITAMIN B-12 500 MCG TABS (CYANOCOBALAMIN) 2 by mouth once daily for Vitamin [name] deficiency  #30 x 12   Entered and Authorized by:   Tresa Garter MD   Signed by:   Tresa Garter MD on 03/20/2010   Method used:   Electronically to        [facility] (retail)       [address]       [place], [place]  [address]       Ph: [phone]       Fax: [phone]   RxID:   [id number]

## 2010-09-25 NOTE — Assessment & Plan Note (Signed)
Summary: b12 shot/plot/cd - coming at 2:30pm   Nurse Visit   Allergies: 1)  Lisinopril (Lisinopril)  Medication Administration  Injection # 1:    Medication: Vit [name] 1000 mcg    Diagnosis: FATIGUE (ICD-780.79)    Route: IM    Site: R deltoid    Exp Date: 11/2011    Lot #: [id number]    Mfr: [employer]    Patient tolerated injection without complications    Given by: Brenton Grills MA (March 20, 2010 2:05 PM)  Orders Added: 1)  Admin of Therapeutic Inj  intramuscular or subcutaneous [96372] 2)  Vit [name] 1000 mcg [J3420]

## 2010-09-25 NOTE — Letter (Signed)
Summary: Primary Care Appointment Letter  [facility]  [address] [place], [place] [address]   Phone: [phone]  Fax: [phone]    03/21/2010 MRN: [id number]  CARLE DARGAN [address] [place], [place]  [address]  Dear Ms. Taite,   Your Primary Care Physician Tresa Garter MD has indicated that:    _______it is time to schedule an appointment.    _______you missed your appointment on______ and need to call and          reschedule.    _______you need to have lab work done.    _______you need to schedule an appointment discuss lab or test results.    ____X___you have appointment that is scheduled for August 8th @ 10:30 a.m. for [name] shot.     Please call our office as soon as possible. Our phone number is [phone]          _________. Please press option 1. Our office is open 8a-12noon and 1p-5p, Monday through Friday.     Thank you,     Primary Care Scheduler

## 2010-09-25 NOTE — Letter (Signed)
Summary: [facility]  [facility]   Imported By: Lennie Odor 07/31/2010 11:25:50  _____________________________________________________________________  External Attachment:    Type:   Image     Comment:   External Document

## 2010-09-25 NOTE — Progress Notes (Signed)
Summary: [name] SCHEDULE  Phone Note From Other Clinic   Summary of Call: PER MD: Patient is to get b12 - 2 xs a week x 4 wks then every 2 wks x's mth then once a month. Pt informed  Initial call taken by: Lamar Sprinkles, CMA,  April 04, 2010 9:37 AM

## 2010-09-25 NOTE — Assessment & Plan Note (Signed)
Summary: [name] Mary Cook [name]   Nurse Visit   Allergies: 1)  Lisinopril (Lisinopril)  Medication Administration  Injection # 1:    Medication: Vit [name] 1000 mcg    Diagnosis: VITAMIN [name] DEFICIENCY (ICD-266.2)    Route: IM    Site: L deltoid    Exp Date: 11/25/2011    Lot #: [id number]    Mfr: [employer]    Patient tolerated injection without complications    Given by: Lanier Prude, CMA(AAMA) (April 16, 2010 9:49 AM)  Orders Added: 1)  Vit [name] 1000 mcg [J3420] 2)  Admin of Therapeutic Inj  intramuscular or subcutaneous [38182]

## 2010-09-25 NOTE — Letter (Signed)
Summary: New Patient letter  [facility]  [address] [place], [place] [address]   Phone: [phone]  Fax: [phone]       12/29/2009 MRN: [id number]  Mary Cook [address] [place], [place]  [address]  Dear Mary Cook,  Welcome to the Gastroenterology Division at [facility].    You are scheduled to see Dr.  Juanda Chance on 03/07/2010 at 10:30am  on the 3rd floor at [facility], [address]  We ask that you try to arrive at our office 15 minutes prior to your appointment time to allow for check-in.  We would like you to complete the enclosed self-administered evaluation form prior to your visit and bring it with you on the day of your appointment.  We will review it with you.  Also, please bring a complete list of all your medications or, if you prefer, bring the medication bottles and we will list them.  Please bring your insurance card so that we may make a copy of it.  If your insurance requires a referral to see a specialist, please bring your referral form from your primary care physician.  Co-payments are due at the time of your visit and may be paid by cash, check or credit card.     Your office visit will consist of a consult with your physician (includes a physical exam), any laboratory testing he/she may order, scheduling of any necessary diagnostic testing (e.g. x-ray, ultrasound, CT-scan), and scheduling of a procedure (e.g. Endoscopy, Colonoscopy) if required.  Please allow enough time on your schedule to allow for any/all of these possibilities.    If you cannot keep your appointment, please call [phone] to cancel or reschedule prior to your appointment date.  This allows [place] the opportunity to schedule an appointment for another patient in need of care.  If you do not cancel or reschedule by 5 p.m. the business day prior to your appointment date, you will be charged a $50.00 late cancellation/no-show fee.    Thank you for  choosing [facility] for your medical needs.  We appreciate the opportunity to care for you.  Please visit [place] at our website  to learn more about our practice.                     Sincerely,                                                             The Gastroenterology Division

## 2010-09-25 NOTE — Assessment & Plan Note (Signed)
Summary: [name]   Nurse Visit   Allergies: 1)  Lisinopril (Lisinopril)  Medication Administration  Injection # 1:    Medication: Vit [name] 1000 mcg    Diagnosis: VITAMIN [name] DEFICIENCY (ICD-266.2)    Route: IM    Site: R deltoid    Exp Date: 12/25/2011    Lot #: [id number]    Mfr: [employer]    Patient tolerated injection without complications    Given by: Lanier Prude, CMA(AAMA) (April 06, 2010 9:39 AM)  Orders Added: 1)  Admin of Therapeutic Inj  intramuscular or subcutaneous [96372] 2)  Vit [name] 1000 mcg [J3420]

## 2010-09-25 NOTE — Letter (Signed)
Summary: New Patient letter  [facility]  [address] [place], [place] [address]   Phone: [phone]  Fax: [phone]       05/03/2010 MRN: [id number]  Mary Cook [address] [place], [place]  [address]  Dear Mary Cook,  Welcome to the Gastroenterology Division at [facility]    You are scheduled to see Dr.  Lina Sar on July 02, 2010 at 10:30am on the 3rd floor at [facility], [address]  We ask that you try to arrive at our office 15 minutes prior to your appointment time to allow for check-in.  We would like you to complete the enclosed self-administered evaluation form prior to your visit and bring it with you on the day of your appointment.  We will review it with you.  Also, please bring a complete list of all your medications or, if you prefer, bring the medication bottles and we will list them.  Please bring your insurance card so that we may make a copy of it.  If your insurance requires a referral to see a specialist, please bring your referral form from your primary care physician.  Co-payments are due at the time of your visit and may be paid by cash, check or credit card.     Your office visit will consist of a consult with your physician (includes a physical exam), any laboratory testing he/she may order, scheduling of any necessary diagnostic testing (e.g. x-ray, ultrasound, CT-scan), and scheduling of a procedure (e.g. Endoscopy, Colonoscopy) if required.  Please allow enough time on your schedule to allow for any/all of these possibilities.    If you cannot keep your appointment, please call [phone] to cancel or reschedule prior to your appointment date.  This allows [place] the opportunity to schedule an appointment for another patient in need of care.  If you do not cancel or reschedule by 5 p.m. the business day prior to your appointment date, you will be charged a $50.00 late cancellation/no-show fee.    Thank you  for choosing [facility] for your medical needs.  We appreciate the opportunity to care for you.  Please visit [place] at our website  to learn more about our practice.                     Sincerely,                                                             The Gastroenterology Division

## 2010-09-27 NOTE — Progress Notes (Signed)
Summary: Diabetic  Phone Note Call from Patient   Summary of Call: Patient is getting her diabetic supplies from [employer] mail order service. They want MD to be aware that this is the only company they will be using for supplies. Please do not sign info for any other diabetic services.  Initial call taken by: Lamar Sprinkles, CMA,  August 07, 2010 11:50 AM  Follow-up for Phone Call        noted Follow-up by: Tresa Garter MD,  August 07, 2010 4:15 PM

## 2010-09-27 NOTE — Medication Information (Signed)
Summary: Glucose Testing Supplies/[employer]  Glucose Testing Supplies/[employer]   Imported By: Sherian Rein 08/29/2010 09:19:08  _____________________________________________________________________  External Attachment:    Type:   Image     Comment:   External Document

## 2010-10-02 ENCOUNTER — Encounter: Payer: Self-pay | Admitting: Internal Medicine

## 2010-10-15 ENCOUNTER — Encounter: Payer: Self-pay | Admitting: Internal Medicine

## 2010-10-15 LAB — CONVERTED CEMR LAB
ALT: 12 units/L (ref 0–35)
Albumin: 3.8 g/dL (ref 3.5–5.2)
BUN: 20 mg/dL (ref 6–23)
Basophils Absolute: 0 10*3/uL (ref 0.0–0.1)
Calcium: 9.2 mg/dL (ref 8.4–10.5)
Chloride: 105 meq/L (ref 96–112)
Eosinophils Absolute: 0.3 10*3/uL (ref 0.0–0.7)
Eosinophils Relative: 4 % (ref 0–5)
Glucose, Bld: 117 mg/dL — ABNORMAL HIGH (ref 70–99)
HCT: 34.8 % — ABNORMAL LOW (ref 36.0–46.0)
Hemoglobin: 11.3 g/dL — ABNORMAL LOW (ref 12.0–15.0)
LDL Cholesterol: 170 mg/dL — ABNORMAL HIGH (ref 0–99)
Lymphs Abs: 1.7 10*3/uL (ref 0.7–4.0)
Neutrophils Relative %: 56 % (ref 43–77)
RBC: 3.9 M/uL (ref 3.87–5.11)
Sodium: 144 meq/L (ref 135–145)
TSH: 2.082 microintl units/mL (ref 0.350–4.500)
Total Bilirubin: 0.5 mg/dL (ref 0.3–1.2)
Total CHOL/HDL Ratio: 4.4
VLDL: 22 mg/dL (ref 0–40)
WBC: 5.9 10*3/uL (ref 4.0–10.5)

## 2010-10-17 ENCOUNTER — Other Ambulatory Visit: Payer: Self-pay

## 2010-10-24 ENCOUNTER — Ambulatory Visit (INDEPENDENT_AMBULATORY_CARE_PROVIDER_SITE_OTHER): Payer: Medicare PPO | Admitting: Internal Medicine

## 2010-10-24 ENCOUNTER — Encounter: Payer: Self-pay | Admitting: Internal Medicine

## 2010-10-24 DIAGNOSIS — E538 Deficiency of other specified B group vitamins: Secondary | ICD-10-CM

## 2010-10-24 DIAGNOSIS — R519 Headache, unspecified: Secondary | ICD-10-CM | POA: Insufficient documentation

## 2010-10-24 DIAGNOSIS — I2510 Atherosclerotic heart disease of native coronary artery without angina pectoris: Secondary | ICD-10-CM

## 2010-10-24 DIAGNOSIS — R51 Headache: Secondary | ICD-10-CM

## 2010-10-24 DIAGNOSIS — E119 Type 2 diabetes mellitus without complications: Secondary | ICD-10-CM

## 2010-10-24 DIAGNOSIS — J320 Chronic maxillary sinusitis: Secondary | ICD-10-CM

## 2010-11-06 ENCOUNTER — Emergency Department (HOSPITAL_COMMUNITY)
Admission: EM | Admit: 2010-11-06 | Discharge: 2010-11-06 | Disposition: A | Discharge status: Home / Self Care | Payer: Medicare PPO | Attending: Emergency Medicine | Admitting: Emergency Medicine

## 2010-11-06 DIAGNOSIS — R112 Nausea with vomiting, unspecified: Secondary | ICD-10-CM | POA: Insufficient documentation

## 2010-11-06 DIAGNOSIS — I252 Old myocardial infarction: Secondary | ICD-10-CM | POA: Insufficient documentation

## 2010-11-06 DIAGNOSIS — R42 Dizziness and giddiness: Secondary | ICD-10-CM | POA: Insufficient documentation

## 2010-11-06 DIAGNOSIS — I498 Other specified cardiac arrhythmias: Secondary | ICD-10-CM | POA: Insufficient documentation

## 2010-11-06 DIAGNOSIS — E119 Type 2 diabetes mellitus without complications: Secondary | ICD-10-CM | POA: Insufficient documentation

## 2010-11-06 DIAGNOSIS — I2510 Atherosclerotic heart disease of native coronary artery without angina pectoris: Secondary | ICD-10-CM | POA: Insufficient documentation

## 2010-11-06 DIAGNOSIS — I10 Essential (primary) hypertension: Secondary | ICD-10-CM | POA: Insufficient documentation

## 2010-11-06 LAB — BASIC METABOLIC PANEL
Chloride: 102 mEq/L (ref 96–112)
Creatinine, Ser: 1 mg/dL (ref 0.4–1.2)
GFR calc Af Amer: >60 mL/min (ref >60)
Sodium: 140 mEq/L (ref 135–145)

## 2010-11-06 LAB — DIFFERENTIAL
Basophils Absolute: 0 10*3/uL (ref 0.0–0.1)
Basophils Relative: 0 % (ref 0–1)
Lymphocytes Relative: 26 % (ref 12–46)
Neutro Abs: 3.8 10*3/uL (ref 1.7–7.7)
Neutrophils Relative %: 60 % (ref 43–77)

## 2010-11-06 LAB — CBC
HCT: 30.3 % — ABNORMAL LOW (ref 36.0–46.0)
MCH: 30 pg (ref 26.0–34.0)
MCHC: 33.7 g/dL (ref 30.0–36.0)
MCV: 89.1 fL (ref 78.0–100.0)
RBC: 3.4 MIL/uL — ABNORMAL LOW (ref 3.87–5.11)
RDW: 12.9 % (ref 11.5–15.5)

## 2010-11-06 LAB — POCT CARDIAC MARKERS
CKMB, poc: <1 ng/mL — ABNORMAL LOW (ref 1.0–8.0)
Myoglobin, poc: 47.4 ng/mL (ref 12–200)
Troponin i, poc: <0.05 ng/mL (ref 0.00–0.09)

## 2010-11-06 NOTE — Assessment & Plan Note (Signed)
Summary: 3 MO FU/NWS   Vital Signs:  Patient profile:   77 year old female Height:      65 inches Weight:      185 pounds BMI:     30.90 Temp:     98.4 degrees F oral Pulse rate:   80 / minute Pulse rhythm:   regular Resp:     16 per minute BP sitting:   190 / 90  (left arm) Cuff size:   regular  Vitals Entered By: Lanier Prude, CMA(AAMA) (October 24, 2010 10:49 AM) CC: 3 mo f/u  Is Patient Diabetic? Yes Comments pt is not using the Freestyle glucose monitor, strips or lancets. She is not taking Cyanocobalamin or Effient.  Please remove these from list.  She is req Rf on Furosemide and the Accu-chek Aviva strips   Primary Care Provider:  Georgina Quint Takisha Pelle MD  CC:  3 mo f/u .  History of Present Illness: The patient presents for a follow up of hypertension, diabetes, hyperlipidemia and CAD She is out of her water pill x 1 month C/o too many pills to take - it is confusing...  Current Medications (verified): 1)  Norvasc 10 Mg  Tabs (Amlodipine Besylate) .... Once Daily Qhs 2)  Glucophage Xr 500 Mg  Tb24 (Metformin Hcl) .... 2 Tabsonce Daily 3)  Glyburide 5 Mg  Tabs (Glyburide) .Marland Kitchen.. 1 By Mouth Qd 4)  Freestyle Lite Test   Strp (Glucose Blood) .... Check Once Daily Prn 5)  Freestyle Unistick Ii Lancets   Misc (Lancets) .... Once Daily Prn 6)  Meclizine Hcl 12.5 Mg Tabs (Meclizine Hcl) .Marland Kitchen.. 1-2 By Mouth Qid As Needed Dizziness 7)  Fluticasone Propionate 50 Mcg/act  Susp (Fluticasone Propionate) .... 2 Sprays Each Nostril Once Daily 8)  Tramadol Hcl 50 Mg Tabs (Tramadol Hcl) .Marland Kitchen.. 1-2 Tabs By Mouth Two Times A Day As Needed Pain 9)  Cyanocobalamin 1000 Mcg/ml Soln (Cyanocobalamin) .Marland Kitchen.. 1 Ml Subcutaneously Q 2 Wks 10)  Aspirin 81 Mg Tbec (Aspirin) .Marland Kitchen.. 1 By Mouth Once Daily 11)  Coreg 25 Mg Tabs (Carvedilol) .Marland Kitchen.. 1 Tab Two Times A Day 12)  Furosemide 40 Mg Tabs (Furosemide) .Marland Kitchen.. 1 Tab Once Daily 13)  Isosorbide Dinitrate 30 Mg Tabs (Isosorbide Dinitrate) .Marland Kitchen.. 1 Tab Once  Daily 14)  Nitrostat 0.4 Mg Subl (Nitroglycerin) .Marland Kitchen.. 1 Sublingual As Needed 15)  Protonix 40 Mg Pack (Pantoprazole Sodium) .Marland Kitchen.. 1 Tab Once Daily 16)  Ramipril 5 Mg Caps (Ramipril) .Marland Kitchen.. 1 Cap Once Daily 17)  Promethazine Hcl 25 Mg Tabs (Promethazine Hcl) .Marland Kitchen.. 1-2 By Mouth Four Times A Day As Needed Nausea 18)  Effient 10 Mg Tabs (Prasugrel Hcl) .Marland Kitchen.. 1 By Mouth Qd 19)  Accu-Chek Aviva  Strp (Glucose Blood) .... Check Qd 20)  Accu-Chek Softclix Lancets  Misc (Lancets) .... Use Once Daily Prn 21)  Citalopram Hydrobromide 20 Mg Tabs (Citalopram Hydrobromide) .Marland Kitchen.. 1 By Mouth Qd 22)  B-12 3000 Mcg Subl (Cyanocobalamin) .Marland Kitchen.. 1 By Mouth Once Daily  Allergies (verified): 1)  Lisinopril (Lisinopril) 2)  Plavix (Clopidogrel Bisulfate)  Past History:  Social History: Last updated: 05/22/2009 Retired Married Never Smoked, but worked in cigarette industry Alcohol use-no Drug use-no Regular exercise-no  Past Medical History: Hypertension Osteoarthritis Osteopenia Diverticulosis, colon Diabetes mellitus, type II Hyperlipidemia Coronary artery disease MI 9/22 Dr Tresa Endo OSA on CPAP  Review of Systems  The patient denies fever, chest pain, syncope, and abdominal pain.    Physical Exam  General:  NAD overweight-appearing.  Ears:  External ear exam shows no significant lesions or deformities.  Otoscopic examination reveals clear canals, tympanic membranes are intact bilaterally without bulging, retraction, inflammation or discharge. Hearing is grossly normal bilaterally. Nose:  External nasal examination shows no deformity or inflammation. Nasal mucosa are pink and moist without lesions or exudates. Mouth:  Oral mucosa and oropharynx without lesions or exudates.  Teeth in good repair. Neck:  No deformities, masses, or tenderness noted. Lungs:  Normal respiratory effort, chest expands symmetrically. Lungs are clear to auscultation, no crackles or wheezes. Heart:  Normal rate and regular  rhythm. S1 and S2 normal without gallop, murmur, click, rub or other extra sounds. Abdomen:  Bowel sounds positive,abdomen soft and non-tender without masses, organomegaly or hernias noted. Msk:  WNL Pulses:  R and L carotid,radial,femoral,dorsalis pedis and posterior tibial pulses are full and equal bilaterally Extremities:  trace left pedal edema and trace right pedal edema.   Neurologic:  No cranial nerve deficits noted. Station and gait are normal. Plantar reflexes are down-going bilaterally. DTRs are symmetrical throughout. Sensory, motor and coordinative functions appear intact. Skin:  Intact without suspicious lesions or rashes Cervical Nodes:  No lymphadenopathy noted Psych:  Cognition and judgment appear intact. Alert and cooperative with normal attention span and concentration. No apparent delusions, illusions, hallucinations   Impression & Recommendations:  Problem # 1:  CORONARY ARTERY DISEASE (ICD-414.00) Assessment Unchanged  The following medications were removed from the medication list:    Norvasc 10 Mg Tabs (Amlodipine besylate) ..... Once daily qhs    Ramipril 5 Mg Caps (Ramipril) .Marland Kitchen... 1 cap once daily    Effient 10 Mg Tabs (Prasugrel hcl) .Marland Kitchen... 1 by mouth qd Her updated medication list for this problem includes:    Amlodipine Besy-benazepril Hcl 10-20 Mg Caps (Amlodipine besy-benazepril hcl) .Marland Kitchen... 1 by mouth once daily for blood pressure    Coreg 25 Mg Tabs (Carvedilol) .Marland Kitchen... 1 tab two times a day    Furosemide 40 Mg Tabs (Furosemide) .Marland Kitchen... 1 tab once daily    Isosorbide Dinitrate 30 Mg Tabs (Isosorbide dinitrate) .Marland Kitchen... 1 tab once daily    Aspirin 81 Mg Tbec (Aspirin) .Marland Kitchen... 1 by mouth once daily    Nitrostat 0.4 Mg Subl (Nitroglycerin) .Marland Kitchen... 1 sublingual as needed  Problem # 2:  HEADACHE (ICD-784.0) L>R Assessment: New  Her updated medication list for this problem includes:    Coreg 25 Mg Tabs (Carvedilol) .Marland Kitchen... 1 tab two times a day    Tramadol Hcl 50 Mg Tabs  (Tramadol hcl) .Marland Kitchen... 1-2 tabs by mouth two times a day as needed pain    Aspirin 81 Mg Tbec (Aspirin) .Marland Kitchen... 1 by mouth once daily Sonography Report: Indication:HA L>R, r/o sinusitis Clinical bedside ultrasound was performed using Terason 3000 machine with a linear transducer.  The images were stored in Terason. Post sinus wall was visualized on L only Impression: L sinusitis, maxillary  Orders: EMR Misc Charge Code (EMRMisc)  Problem # 3:  SINUSITIS, MAXILLARY, CHRONIC (ICD-473.0) L Assessment: New  Her updated medication list for this problem includes:    Fluticasone Propionate 50 Mcg/act Susp (Fluticasone propionate) .Marland Kitchen... 2 sprays each nostril once daily    Amoxicillin 500 Mg Caps (Amoxicillin) .Marland Kitchen... 2 caps by mouth bid  Problem # 4:  VITAMIN [name] DEFICIENCY (ICD-266.2) Assessment: Unchanged On the regimen of medicine(s) reflected in the chart    Problem # 5:  DIABETES MELLITUS, TYPE II (ICD-250.00) Assessment: Unchanged  The following medications were removed from the medication list:  Glucophage Xr 500 Mg Tb24 (Metformin hcl) .Marland Kitchen... 2 tabsonce daily    Glyburide 5 Mg Tabs (Glyburide) .Marland Kitchen... 1 by mouth qd    Ramipril 5 Mg Caps (Ramipril) .Marland Kitchen... 1 cap once daily Her updated medication list for this problem includes:    Amlodipine Besy-benazepril Hcl 10-20 Mg Caps (Amlodipine besy-benazepril hcl) .Marland Kitchen... 1 by mouth once daily for blood pressure    Glyburide-metformin 2.5-500 Mg Tabs (Glyburide-metformin) .Marland Kitchen... 1 by mouth bid    Aspirin 81 Mg Tbec (Aspirin) .Marland Kitchen... 1 by mouth once daily  Problem # 6:  HYPERTENSION (ICD-401.9) Assessment: Deteriorated Risks of noncompliance with treatment discussed. Compliance encouraged. Will consolidate meds The following medications were removed from the medication list:    Norvasc 10 Mg Tabs (Amlodipine besylate) ..... Once daily qhs    Ramipril 5 Mg Caps (Ramipril) .Marland Kitchen... 1 cap once daily Her updated medication list for this problem includes:     Amlodipine Besy-benazepril Hcl 10-20 Mg Caps (Amlodipine besy-benazepril hcl) .Marland Kitchen... 1 by mouth once daily for blood pressure    Coreg 25 Mg Tabs (Carvedilol) .Marland Kitchen... 1 tab two times a day    Furosemide 40 Mg Tabs (Furosemide) .Marland Kitchen... 1 tab once daily  Complete Medication List: 1)  Amlodipine Besy-benazepril Hcl 10-20 Mg Caps (Amlodipine besy-benazepril hcl) .Marland Kitchen.. 1 by mouth once daily for blood pressure 2)  Glyburide-metformin 2.5-500 Mg Tabs (Glyburide-metformin) .Marland Kitchen.. 1 by mouth bid 3)  Protonix 40 Mg Pack (Pantoprazole sodium) .Marland Kitchen.. 1 tab once daily 4)  Coreg 25 Mg Tabs (Carvedilol) .Marland Kitchen.. 1 tab two times a day 5)  Furosemide 40 Mg Tabs (Furosemide) .Marland Kitchen.. 1 tab once daily 6)  Isosorbide Dinitrate 30 Mg Tabs (Isosorbide dinitrate) .Marland Kitchen.. 1 tab once daily 7)  Meclizine Hcl 12.5 Mg Tabs (Meclizine hcl) .Marland Kitchen.. 1-2 by mouth qid as needed dizziness 8)  Fluticasone Propionate 50 Mcg/act Susp (Fluticasone propionate) .... 2 sprays each nostril once daily 9)  Tramadol Hcl 50 Mg Tabs (Tramadol hcl) .Marland Kitchen.. 1-2 tabs by mouth two times a day as needed pain 10)  Aspirin 81 Mg Tbec (Aspirin) .Marland Kitchen.. 1 by mouth once daily 11)  Nitrostat 0.4 Mg Subl (Nitroglycerin) .Marland Kitchen.. 1 sublingual as needed 12)  Accu-chek Aviva Strp (Glucose blood) .... Check qd 13)  Accu-chek Softclix Lancets Misc (Lancets) .... Use once daily prn 14)  Citalopram Hydrobromide 20 Mg Tabs (Citalopram hydrobromide) .Marland Kitchen.. 1 by mouth qd 15)  B-12 3000 Mcg Subl (Cyanocobalamin) .Marland Kitchen.. 1 by mouth once daily 16)  Amoxicillin 500 Mg Caps (Amoxicillin) .... 2 caps by mouth bid  Patient Instructions: 1)  Please schedule a follow-up appointment in 3 months. 2)  BMP prior to visit, ICD-9: 3)  Hepatic Panel prior to visit, ICD-9: 4)  Lipid Panel prior to visit, ICD-9: 5)  TSH prior to visit, ICD-9: 6)  HbgA1C prior to visit, ICD-9: 7)  Vit [name] 266.20  414.8 250.00 Prescriptions: AMOXICILLIN 500 MG CAPS (AMOXICILLIN) 2 caps by mouth bid  #80 x 0   Entered and  Authorized by:   Tresa Garter MD   Signed by:   Tresa Garter MD on 10/24/2010   Method used:   Print then Give to Patient   RxID:   [id number] AMLODIPINE BESY-BENAZEPRIL HCL 10-20 MG CAPS (AMLODIPINE BESY-BENAZEPRIL HCL) 1 by mouth once daily for blood pressure  #90 x 3   Entered and Authorized by:   Tresa Garter MD   Signed by:   Tresa Garter MD on 10/24/2010   Method used:  Print then Give to Patient   RxID:   [id number] GLYBURIDE-METFORMIN 2.5-500 MG TABS (GLYBURIDE-METFORMIN) 1 by mouth bid  #180 x 3   Entered and Authorized by:   Tresa Garter MD   Signed by:   Tresa Garter MD on 10/24/2010   Method used:   Print then Give to Patient   RxID:   [id number]    Orders Added: 1)  EMR Misc Charge Code [EMRMisc] 2)  Est. Patient Level IV [84696]

## 2010-11-07 LAB — APTT: aPTT: 26 seconds (ref 24–37)

## 2010-11-07 LAB — POCT CARDIAC MARKERS
CKMB, poc: <1 ng/mL — ABNORMAL LOW (ref 1.0–8.0)
Troponin i, poc: <0.05 ng/mL (ref 0.00–0.09)

## 2010-11-07 LAB — COMPREHENSIVE METABOLIC PANEL
ALT: 16 U/L (ref 0–35)
AST: 33 U/L (ref 0–37)
Albumin: 3.5 g/dL (ref 3.5–5.2)
Calcium: 9.3 mg/dL (ref 8.4–10.5)
Chloride: 100 mEq/L (ref 96–112)
Creatinine, Ser: 1.47 mg/dL — ABNORMAL HIGH (ref 0.4–1.2)
Glucose, Bld: 222 mg/dL — ABNORMAL HIGH (ref 70–99)
Total Protein: 6.6 g/dL (ref 6.0–8.3)

## 2010-11-07 LAB — CBC
HCT: 35.1 % — ABNORMAL LOW (ref 36.0–46.0)
MCV: 89.8 fL (ref 78.0–100.0)
RBC: 3.91 MIL/uL (ref 3.87–5.11)
RDW: 12.9 % (ref 11.5–15.5)
WBC: 6.3 10*3/uL (ref 4.0–10.5)

## 2010-11-07 LAB — DIFFERENTIAL
Basophils Relative: 0 % (ref 0–1)
Eosinophils Absolute: 0.1 10*3/uL (ref 0.0–0.7)
Eosinophils Relative: 2 % (ref 0–5)
Lymphocytes Relative: 25 % (ref 12–46)
Monocytes Absolute: 0.5 10*3/uL (ref 0.1–1.0)

## 2010-11-07 LAB — PROTIME-INR: INR: 0.96 (ref 0.00–1.49)

## 2010-11-08 LAB — BASIC METABOLIC PANEL
BUN: 10 mg/dL (ref 6–23)
BUN: 10 mg/dL (ref 6–23)
BUN: 12 mg/dL (ref 6–23)
BUN: 13 mg/dL (ref 6–23)
BUN: 14 mg/dL (ref 6–23)
BUN: 17 mg/dL (ref 6–23)
BUN: 18 mg/dL (ref 6–23)
CO2: 29 mEq/L (ref 19–32)
CO2: 29 mEq/L (ref 19–32)
CO2: 29 mEq/L (ref 19–32)
Calcium: 8.4 mg/dL (ref 8.4–10.5)
Calcium: 8.8 mg/dL (ref 8.4–10.5)
Calcium: 9 mg/dL (ref 8.4–10.5)
Chloride: 100 mEq/L (ref 96–112)
Chloride: 102 mEq/L (ref 96–112)
Chloride: 102 mEq/L (ref 96–112)
Chloride: 107 mEq/L (ref 96–112)
Chloride: 99 mEq/L (ref 96–112)
Creatinine, Ser: 0.73 mg/dL (ref 0.4–1.2)
Creatinine, Ser: 0.79 mg/dL (ref 0.4–1.2)
Creatinine, Ser: 0.97 mg/dL (ref 0.4–1.2)
Creatinine, Ser: 1.18 mg/dL (ref 0.4–1.2)
GFR calc Af Amer: 53 mL/min — ABNORMAL LOW (ref >60)
GFR calc non Af Amer: 44 mL/min — ABNORMAL LOW (ref >60)
GFR calc non Af Amer: 48 mL/min — ABNORMAL LOW (ref >60)
GFR calc non Af Amer: 55 mL/min — ABNORMAL LOW (ref >60)
GFR calc non Af Amer: >60 mL/min (ref >60)
GFR calc non Af Amer: >60 mL/min (ref >60)
GFR calc non Af Amer: >60 mL/min (ref >60)
Glucose, Bld: 134 mg/dL — ABNORMAL HIGH (ref 70–99)
Glucose, Bld: 136 mg/dL — ABNORMAL HIGH (ref 70–99)
Glucose, Bld: 138 mg/dL — ABNORMAL HIGH (ref 70–99)
Glucose, Bld: 139 mg/dL — ABNORMAL HIGH (ref 70–99)
Glucose, Bld: 150 mg/dL — ABNORMAL HIGH (ref 70–99)
Potassium: 3.6 mEq/L (ref 3.5–5.1)
Potassium: 3.6 mEq/L (ref 3.5–5.1)
Potassium: 3.7 mEq/L (ref 3.5–5.1)
Potassium: 3.8 mEq/L (ref 3.5–5.1)
Potassium: 3.8 mEq/L (ref 3.5–5.1)
Potassium: 3.8 mEq/L (ref 3.5–5.1)
Sodium: 134 mEq/L — ABNORMAL LOW (ref 135–145)
Sodium: 137 mEq/L (ref 135–145)
Sodium: 137 mEq/L (ref 135–145)
Sodium: 138 mEq/L (ref 135–145)
Sodium: 138 mEq/L (ref 135–145)
Sodium: 140 mEq/L (ref 135–145)

## 2010-11-08 LAB — CARDIAC PANEL(CRET KIN+CKTOT+MB+TROPI)
CK, MB: 124.6 ng/mL (ref 0.3–4.0)
CK, MB: 17.5 ng/mL (ref 0.3–4.0)
CK, MB: 243.2 ng/mL (ref 0.3–4.0)
CK, MB: 357.2 ng/mL (ref 0.3–4.0)
Relative Index: 11.3 — ABNORMAL HIGH (ref 0.0–2.5)
Relative Index: 7.3 — ABNORMAL HIGH (ref 0.0–2.5)
Total CK: 1110 U/L — ABNORMAL HIGH (ref 7–177)
Total CK: 1697 U/L — ABNORMAL HIGH (ref 7–177)
Total CK: 3154 U/L — ABNORMAL HIGH (ref 7–177)
Total CK: 54 U/L (ref 7–177)
Total CK: 635 U/L — ABNORMAL HIGH (ref 7–177)
Troponin I: 1.09 ng/mL (ref 0.00–0.06)
Troponin I: 23.89 ng/mL (ref 0.00–0.06)

## 2010-11-08 LAB — CBC
HCT: 29.8 % — ABNORMAL LOW (ref 36.0–46.0)
HCT: 31 % — ABNORMAL LOW (ref 36.0–46.0)
HCT: 31.7 % — ABNORMAL LOW (ref 36.0–46.0)
HCT: 32.2 % — ABNORMAL LOW (ref 36.0–46.0)
HCT: 38.8 % (ref 36.0–46.0)
Hemoglobin: 10.5 g/dL — ABNORMAL LOW (ref 12.0–15.0)
Hemoglobin: 10.9 g/dL — ABNORMAL LOW (ref 12.0–15.0)
Hemoglobin: 13.3 g/dL (ref 12.0–15.0)
Hemoglobin: 9.9 g/dL — ABNORMAL LOW (ref 12.0–15.0)
MCH: 29.2 pg (ref 26.0–34.0)
MCH: 29.4 pg (ref 26.0–34.0)
MCH: 29.4 pg (ref 26.0–34.0)
MCH: 29.9 pg (ref 26.0–34.0)
MCHC: 32.8 g/dL (ref 30.0–36.0)
MCHC: 33.1 g/dL (ref 30.0–36.0)
MCHC: 33.1 g/dL (ref 30.0–36.0)
MCHC: 33.2 g/dL (ref 30.0–36.0)
MCHC: 33.2 g/dL (ref 30.0–36.0)
MCV: 87.3 fL (ref 78.0–100.0)
MCV: 88.3 fL (ref 78.0–100.0)
MCV: 89.1 fL (ref 78.0–100.0)
MCV: 90.1 fL (ref 78.0–100.0)
Platelets: 172 10*3/uL (ref 150–400)
Platelets: 179 10*3/uL (ref 150–400)
Platelets: 239 10*3/uL (ref 150–400)
Platelets: 243 10*3/uL (ref 150–400)
Platelets: 306 10*3/uL (ref 150–400)
RBC: 3.31 MIL/uL — ABNORMAL LOW (ref 3.87–5.11)
RBC: 3.38 MIL/uL — ABNORMAL LOW (ref 3.87–5.11)
RBC: 3.43 MIL/uL — ABNORMAL LOW (ref 3.87–5.11)
RBC: 3.57 MIL/uL — ABNORMAL LOW (ref 3.87–5.11)
RBC: 3.64 MIL/uL — ABNORMAL LOW (ref 3.87–5.11)
RBC: 4.35 MIL/uL (ref 3.87–5.11)
RDW: 12.6 % (ref 11.5–15.5)
RDW: 12.6 % (ref 11.5–15.5)
RDW: 12.7 % (ref 11.5–15.5)
RDW: 12.9 % (ref 11.5–15.5)
WBC: 10 10*3/uL (ref 4.0–10.5)
WBC: 6.7 10*3/uL (ref 4.0–10.5)
WBC: 7.1 10*3/uL (ref 4.0–10.5)
WBC: 7.2 10*3/uL (ref 4.0–10.5)
WBC: 7.6 10*3/uL (ref 4.0–10.5)

## 2010-11-08 LAB — POCT I-STAT, CHEM 8
Calcium, Ion: 1.1 mmol/L — ABNORMAL LOW (ref 1.12–1.32)
Chloride: 107 mEq/L (ref 96–112)
HCT: 40 % (ref 36.0–46.0)
Potassium: 3.7 mEq/L (ref 3.5–5.1)
Sodium: 138 mEq/L (ref 135–145)

## 2010-11-08 LAB — COMPREHENSIVE METABOLIC PANEL
ALT: 27 U/L (ref 0–35)
ALT: 39 U/L — ABNORMAL HIGH (ref 0–35)
AST: 25 U/L (ref 0–37)
AST: 87 U/L — ABNORMAL HIGH (ref 0–37)
Albumin: 2.7 g/dL — ABNORMAL LOW (ref 3.5–5.2)
Albumin: 2.9 g/dL — ABNORMAL LOW (ref 3.5–5.2)
Alkaline Phosphatase: 65 U/L (ref 39–117)
Alkaline Phosphatase: 73 U/L (ref 39–117)
BUN: 10 mg/dL (ref 6–23)
CO2: 23 mEq/L (ref 19–32)
Calcium: 8.1 mg/dL — ABNORMAL LOW (ref 8.4–10.5)
Chloride: 103 mEq/L (ref 96–112)
Creatinine, Ser: 0.85 mg/dL (ref 0.4–1.2)
Creatinine, Ser: 1.16 mg/dL (ref 0.4–1.2)
GFR calc Af Amer: 54 mL/min — ABNORMAL LOW (ref >60)
GFR calc Af Amer: >60 mL/min (ref >60)
GFR calc non Af Amer: >60 mL/min (ref >60)
Glucose, Bld: 177 mg/dL — ABNORMAL HIGH (ref 70–99)
Potassium: 3.9 mEq/L (ref 3.5–5.1)
Potassium: 4 mEq/L (ref 3.5–5.1)
Sodium: 135 mEq/L (ref 135–145)
Sodium: 136 mEq/L (ref 135–145)
Total Bilirubin: 0.6 mg/dL (ref 0.3–1.2)
Total Protein: 6.1 g/dL (ref 6.0–8.3)

## 2010-11-08 LAB — DIFFERENTIAL
Basophils Absolute: 0 10*3/uL (ref 0.0–0.1)
Basophils Absolute: 0 10*3/uL (ref 0.0–0.1)
Basophils Relative: 0 % (ref 0–1)
Eosinophils Absolute: 0 10*3/uL (ref 0.0–0.7)
Eosinophils Relative: 1 % (ref 0–5)
Lymphocytes Relative: 17 % (ref 12–46)
Monocytes Absolute: 0.5 10*3/uL (ref 0.1–1.0)
Monocytes Relative: 11 % (ref 3–12)
Neutro Abs: 6.4 10*3/uL (ref 1.7–7.7)
Neutrophils Relative %: 70 % (ref 43–77)

## 2010-11-08 LAB — URINALYSIS, ROUTINE W REFLEX MICROSCOPIC
Bilirubin Urine: NEGATIVE
Glucose, UA: NEGATIVE mg/dL
Hgb urine dipstick: NEGATIVE
Ketones, ur: NEGATIVE mg/dL
Nitrite: NEGATIVE
Specific Gravity, Urine: 1.017 (ref 1.005–1.030)
Specific Gravity, Urine: 1.02 (ref 1.005–1.030)
Urobilinogen, UA: 0.2 mg/dL (ref 0.0–1.0)
pH: 7 (ref 5.0–8.0)

## 2010-11-08 LAB — BRAIN NATRIURETIC PEPTIDE
Pro B Natriuretic peptide (BNP): 351 pg/mL — ABNORMAL HIGH (ref 0.0–100.0)
Pro B Natriuretic peptide (BNP): 417 pg/mL — ABNORMAL HIGH (ref 0.0–100.0)

## 2010-11-08 LAB — GLUCOSE, CAPILLARY
Glucose-Capillary: 140 mg/dL — ABNORMAL HIGH (ref 70–99)
Glucose-Capillary: 140 mg/dL — ABNORMAL HIGH (ref 70–99)
Glucose-Capillary: 144 mg/dL — ABNORMAL HIGH (ref 70–99)
Glucose-Capillary: 145 mg/dL — ABNORMAL HIGH (ref 70–99)
Glucose-Capillary: 148 mg/dL — ABNORMAL HIGH (ref 70–99)
Glucose-Capillary: 150 mg/dL — ABNORMAL HIGH (ref 70–99)
Glucose-Capillary: 150 mg/dL — ABNORMAL HIGH (ref 70–99)
Glucose-Capillary: 151 mg/dL — ABNORMAL HIGH (ref 70–99)
Glucose-Capillary: 160 mg/dL — ABNORMAL HIGH (ref 70–99)
Glucose-Capillary: 162 mg/dL — ABNORMAL HIGH (ref 70–99)
Glucose-Capillary: 164 mg/dL — ABNORMAL HIGH (ref 70–99)
Glucose-Capillary: 165 mg/dL — ABNORMAL HIGH (ref 70–99)
Glucose-Capillary: 175 mg/dL — ABNORMAL HIGH (ref 70–99)
Glucose-Capillary: 180 mg/dL — ABNORMAL HIGH (ref 70–99)
Glucose-Capillary: 216 mg/dL — ABNORMAL HIGH (ref 70–99)

## 2010-11-08 LAB — LIPID PANEL
Cholesterol: 194 mg/dL (ref 0–200)
HDL: 48 mg/dL (ref >39)
Total CHOL/HDL Ratio: 4 RATIO
VLDL: 22 mg/dL (ref 0–40)

## 2010-11-08 LAB — POCT CARDIAC MARKERS
CKMB, poc: 1.3 ng/mL (ref 1.0–8.0)
CKMB, poc: 23.7 ng/mL (ref 1.0–8.0)
Myoglobin, poc: 310 ng/mL (ref 12–200)
Troponin i, poc: 1.63 ng/mL (ref 0.00–0.09)
Troponin i, poc: <0.05 ng/mL (ref 0.00–0.09)

## 2010-11-08 LAB — MAGNESIUM: Magnesium: 1.9 mg/dL (ref 1.5–2.5)

## 2010-11-08 LAB — PROTIME-INR: INR: 0.89 (ref 0.00–1.49)

## 2010-11-08 LAB — APTT: aPTT: 26 seconds (ref 24–37)

## 2010-11-08 LAB — HEMOGLOBIN A1C
Hgb A1c MFr Bld: 6.3 % — ABNORMAL HIGH (ref <5.7)
Mean Plasma Glucose: 134 mg/dL — ABNORMAL HIGH (ref <117)

## 2010-11-08 LAB — URINE CULTURE
Colony Count: 10000
Culture  Setup Time: 201109190633

## 2010-11-08 LAB — URINE MICROSCOPIC-ADD ON

## 2011-01-11 NOTE — H&P (Signed)
NAMEDHRITHI, RICHE           ACCOUNT NO.:  [id number]   MEDICAL RECORD NO.:  [id number]          PATIENT TYPE:  INP   LOCATION:  3714                         FACILITY:  [facility]   PHYSICIAN:  Georgina Quint. Plotnikov, M.D. LHCDATE OF BIRTH:  08-11-1926   DATE OF ADMISSION:  03/27/2005  DATE OF DISCHARGE:                                HISTORY & PHYSICAL   CHIEF COMPLAINT:  Severe chest pain under left shoulder blade.   HISTORY OF PRESENT ILLNESS:  The patient is a 77 year old female with  multiple medical problems who presents today with complaint of severe pain  in the right posterior chest that developed on Monday.  It started about an  hour after she strained in the bathroom.  It was severe, has gradually  gotten worse, and today she rates it a 10 out of 10.  She had been nauseated  in the office.  There were no sweats, anterior chest pain, arm pain, or  numbness, syncope, and/or other accompanying signs.   PAST MEDICAL HISTORY:  Diabetes, hypertension, osteoarthritis, CVA,  hypertension.   ALLERGIES:  No known drug allergies.   SOCIAL HISTORY:  She is married.  Never smoked.  Lives with husband.   FAMILY HISTORY:  Both parents are deceased with probable cardiovascular  disease in the family present.   CURRENT MEDICATIONS:  1.  Norvasc 10 mg daily.  2.  Crestor 20 mg daily.  3.  Normodyne 400 mg twice daily.  4.  Glucophage XR 500 mg once daily.  5.  Prilosec 20 mg daily.  6.  Zestoretic 20/12.5 daily.   REVIEW OF SYSTEMS:  No previous symptoms.  No accompanying symptoms.  Nausea  as above.  No vomiting.  The rest is negative or as described above.   PHYSICAL EXAMINATION:  VITAL SIGNS:  Blood pressure 160/72, pulse 66,  temperature 98.6, weight 208 pounds.  GENERAL:  She is in mild acute distress due to pain.  HEENT:  Moist mucosa.  NECK:  Supple, no meningeal signs.  LUNGS:  Clear.  No wheezes or rales.  No rubs.  HEART:  S1 and S2.  Distant tones.  No gallops.  ABDOMEN:  Soft and nontender.  No organomegaly and no masses felt.  EXTREMITIES:  Lower extremities without edema.  Calves nontender.  NEUROLOGY:  She is alert, oriented, and cooperative, slightly anxious.  Pupils reactive to light.  Cranial nerves II-XII nonfocal.  SKIN:  Clear, no rashes.  CHEST:  Wall on palpation reveals slight sensitivity under left shoulder  blade, otherwise unremarkable.  ABDOMEN:  Unremarkable, no pulsating masses.  Peripheral pulses are  symmetric and normal.   LABORATORY DATA:  None available.   ASSESSMENT:  1.  Severe left posterior chest pain of unknown etiology.  Will admit to      telemetry through ER.  Obtain chest CT scan to rule out PE, aneurysm.      Obtain EKG, serial CK's/troponins.  Will start on Lovenox if negative      CAT scan for bleeding aneurysm.  2.  Nausea due to problem #1.  I will use Phenergan IV.  3.  History of cerebrovascular accident.  4.  Diabetes.  Will use sliding scale Humalog.  5.  Hypertension.  Will continue with current therapy.     __    AVP/MEDQ  D:  03/27/2005  T:  03/27/2005  Job:  [id number]

## 2011-01-11 NOTE — Op Note (Signed)
Mary Cook, Mary Cook           ACCOUNT NO.:  [id number]   MEDICAL RECORD NO.:  [id number]          PATIENT TYPE:  AMB   LOCATION:  DSC                          FACILITY:  [facility]   PHYSICIAN:  Claude Manges. Whitfield, M.D.DATE OF BIRTH:  03/04/1926   DATE OF PROCEDURE:  10/10/2005  DATE OF DISCHARGE:                                 OPERATIVE REPORT   PREOPERATIVE DIAGNOSIS:  1.  Rotator cuff tear, left shoulder, with impingement.  2.  Degenerative joint disease acromioclavicular joint.  3.  Rupture of biceps tendon.   POSTOPERATIVE DIAGNOSIS:  1.  Rotator cuff tear, left shoulder, with impingement.  2.  Degenerative joint disease acromioclavicular joint.  3.  Rupture of biceps tendon.   PROCEDURE:  1.  Arthroscopic debridement left shoulder including biceps stump.  2.  Arthroscopic subacromial decompression.  3.  Arthroscopic distal clavicle resection.  4.  Mini open rotator cuff tear repair.   SURGEON:  Claude Manges. Cleophas Dunker, M.D.   ASSISTANT:  Richardean Canal, P.A.-C.   ANESTHESIA:  General orotracheal with interscalene nerve block.   COMPLICATIONS:  None.   HISTORY:  This 77 year old female sustained an anterior dislocation of her  left shoulder approximately 6 weeks ago.  She has had persistent pain to the  point of compromise with difficulty raising her arm over her head without  pain, although she actively is able to perform that maneuver.  She has  positive impingement.  She has had an MRI scan that reveals a rotator cuff  tear with degenerative changes of the [facility] and acromion causing impingement.  There was also evidence of a biceps tendon rupture.  She is now to have an  arthroscopic procedure and then an open mini rotator cuff tear repair.   PROCEDURE:  The patient comfortable operating table and under general  orotracheal anesthesia, the patient was placed the semi-sitting position  with the shoulder frame.  The appropriate extremity, i.e., left upper  extremity, had  been marked been marked preoperatively.  The patient also had  a preoperative interscalene nerve block with excellent anesthesia of the  left shoulder.  The left shoulder and arm was then prepped circumferentially  from the base of the neck to the mid forearm.  Sterile draping was  performed.   A marking pen was used to outline the [facility] joint, the acromion, and the  coracoid.  At a point a fingerbreadth inferior to medial to the posterior  angle acromion a small stab wound was made.  The arthroscope was easily  placed into the shoulder joint.  Diagnostic arthroscopy revealed a rupture  of the biceps tendon with a stump that would wave anterior to posterior.  A  second portal was established anteriorly with the cannula and shaving of the  biceps stump was performed.  There also was moderate synovitis which was  removed with the ArthroCare wand and some evidence of arthritis of the  humeral head which was also debrided.  There was an obvious rotator cuff  tear as I could visualize the subacromial space.  The arthroscope was then  placed in the subacromial space posteriorly, the cannula subacromial  space  anteriorly, and arthroscopic subacromial decompression was performed with  the ArthroCare wand and the 6 mm bur.  There was considerable the anterior  and lateral overhang of the acromion causing impingement.  I had a very nice  resection.  There was also considerable degenerative change of the [facility] joint  with a little articular cartilage remaining on the distal clavicle, an  obvious impingement on the cuff beneath, and a distal clavicle resection was  performed with a 6 mm bur with a nice resection.   A mini open rotator cuff tear report repair was then performed.  About 1.5  inch incision was made along the anterior aspect of the shoulder where sharp  dissection was carried to the subcutaneous tissue.  Bovie coagulation was  used to incise the deltoid fascia and by blunt dissection, the  fibers were  elevated.  The joint was then explored.  There was an obvious rotator cuff  tear, it was in a V fashion extending from posterior superiorly separating  about 2 cm at the humeral head.  A bony trough was then made.  I resected  the rounded edges of the cuff both medially and laterally and then repaired  it from the apex posteriorly with a 0 Ethilon stitch to the anterior aspect  of the humeral head and at the very last stitch, I used a single Mitek  anchor.  I had a nice repair by finger palpation, a very nice subacromial  decompression, and distal clavicle resection.  There was no evidence of  impingement.  The wound was irrigated with saline solution.  The deltoid  fascia closed with running 0 Vicryl, subcuticular with 2-0 Vicryl, skin  closed with skin clips. A sterile bulky dressing was applied followed by a  sling.   PLAN:  Recovery care, discharge in the a.m. on Percocet, office in one week.      Claude Manges. Cleophas Dunker, M.D.  Electronically Signed     PWW/MEDQ  D:  10/10/2005  T:  10/10/2005  Job:  [id number]

## 2011-01-11 NOTE — Procedures (Signed)
[place]. [facility]  Patient:    Mary Cook, Mary Cook                    MRN: [id number] Proc. Date: 10/02/00 Attending:  Hedwig Morton. Juanda Chance, M.D. [facility] CC:         Quita Skye. Artis Flock, M.D.   Procedure Report  PROCEDURE:  Flexible sigmoidoscopy.  INDICATIONS:  This 77 year old white female has a history of nonspecific mucosal colitis as well as diverticulosis.  She had two previous colonoscopies, the last one in March of 2000 showing only diverticulosis in the left colon.  This time she presented with left colon bleed, passage of multiple bloody stools, and some tenderness in the left quadrant of the abdomen.  It was not clear whether this represents colitis or diverticular bleed.  For this reason, she is undergoing flexible sigmoidoscopy.  She has been on Celebrex.  ENDOSCOPE:  Fujinon single channel video endoscope.  SEDATION:  Versed 5 mg IV, fentanyl 50 mcg IV.  FINDINGS:  The Fujinon single channel video endoscope was passed under direct vision through the rectum to the sigmoid colon.  The patient was monitored by pulse oximetry and oxygen saturation was satisfactory.  Her prep was very poor. Anal canal and rectal ampulla was normal.  There were no significant hemorrhoids.  In the sigmoid colon, there was some yellowish stool as well as specks of bloody dark stool which was formed.  The bloody stool was present only between 20 and 30 cm from the rectum.  As the colonoscope advanced through the marked diverticulosis of the sigmoid colon, it became apparent that there was no bleeding in the descending colon and the splenic flexure. As the colonoscope passed through the splenic flexure, only yellowish stool which was easily washed off was seen.  Mucosa underneath the stool was entirely normal.  Multiple biopsies were taken from the splenic flexure and left colon to rule out microscopic colitis.  As the colonoscope was retracted, it was noted that the only blood  noted was between 20 and 30 cm from the rectum at the area of diverticulosis.  IMPRESSION: 1. Marked diverticulosis of the sigmoid colon. 2. Most likely diverticular bleed. 3. No evidence of acute colitis, status post random biopsies of the left    colon.  PLAN:  The patients bleeding was most likely related to diverticular because of presence of blood only in the lower colon and the fact that the mucosa appeared normal, although, the biopsies will provide conclusive diagnosis.  We will advance the patients diet and monitor H&H.  She may be able to go home in the next 24 hours. DD:  10/03/00 TD:  10/03/00 Job: [id number]

## 2011-01-11 NOTE — Discharge Summary (Signed)
[place]. [facility]  Patient:    Mary Cook, Mary Cook                    MRN: [id number] Adm. Date:  [id number] Disc. Date: [id number] Attending:  Mervin Hack Dictator:   Dianah Field, P.A. CC:         Quita Skye. Artis Flock, M.D.   Discharge Summary  DATE OF BIRTH:  [birth year]  ADMISSION DIAGNOSES:  1. Painless hematochezia, rule out diverticular bleed, less likely this     represents recurrent colitis.  2. Known diverticulosis.  3. History of colitis of undetermined type, resolved.  4. Family history of colon cancer in a younger sister.  5. Degenerative joint disease for which she is on daily Celebrex.  6. History of gastroesophageal reflux disease.  7. Status post hemorrhoidectomy.  8. Depression.  The patients daughter died within the last 12 months.  9. History of hypertension. 10. Status post appendectomy. 11. Status post surgery to remove bone spurs from the heel. 12. Right hand ganglion cyst removal. 13. Removal of lipoma from the right breast.  DISCHARGE DIAGNOSES:  1. Lower gastrointestinal bleed.  This was probably diverticular in source.     No evidence for colitis by flexible sigmoidoscopy on the day of discharge.  2. History of pancolitis, no evidence for recurrence.  3. Mild hypokalemia, treated with oral potassium chloride.  4. Gastroesophageal reflux disease.  Symptoms are to be treated with daily     ranitidine.  Proton pump inhibitor therapy has proved too expensive for     this patient to afford.  5. Depression.  The patient has been encouraged to discuss this in the near     future with Dr. Artis Flock and, perhaps, to be placed on a trial of     antidepressants.  6. Controlled hypertension.  7. Degenerative joint disease.  Celebrex is to be held for the next couple of     weeks in light of recent gastrointestinal bleeding.  PROCEDURES:  Flexible sigmoidoscopy on October 03, 2000, by Dr. Lina Sar. Findings were that of  an essentially normal mucosa with the exception of left-sided diverticulosis and some old blood.  No evidence for acute colitis. Random biopsies of the left colon were obtained.  Pathology on these biopsies is pending at the time of this dictation.  CONSULTATIONS:  The patient was admitted by Dr. Lorenz Coaster.  Dr. Juanda Chance saw the patient in consultation and took over care of the patient for Dr. Lorenz Coaster.  HISTORY OF PRESENT ILLNESS:  Mrs. Mary Cook is a very nice 77 year old white female who is generally quite healthy and has a history of pancolitis in 1998. Review of her [place] hospital records show that the colitis was throughout the colon and that the biopsies showed acute mucosal colitis with some cryptitis, question of whether this is secondary to inflammatory bowel disease versus infectious colitis and less likely diagnosis was ischemic.  In any case, she was not treated with any steroids or mesalamine preparations.  She was treated with Cipro at that time.  The symptoms resolved.  Her most recent colonoscopy had been in March 2000, which showed left colon diverticulosis without any  evidence for diverticulitis.  She was treated empirically prior to the colonoscopy for questionable diverticulitis.  She had been treated with ciprofloxacin but had not undergone any diagnostic imaging studies to make the diagnosis of diverticulitis.  So far as this hospitalization goes, the patient developed painless hematochezia at about  11 p.m. on October 01, 2000.  She describes about six episodes of blood per rectum, some of these occurring after her arrival in the emergency room.  Her last bloody bowel movement occurred at about 2 a.m. on October 02, 2000.  Initial hemoglobin was 12.6 and, on recheck several hours later, it had gone to 11.  Her MCV was normal.  Blood pressure on arrival was 199/89 with a pulse of 85.  She was in no distress.  She had not had any dizziness or presyncopal symptoms.  On  rectal exam she did have red blood. She was admitted by Dr. Lorenz Coaster, covering for Dr. Artis Flock.  Her p.o. was limited to sips of clear liquids, and serial CBCs were ordered.  HOSPITAL COURSE:  The patient was admitted to 71.  She remained stable.  She passed no further spontaneous blood per rectum.  However, when she did have enemas as a preparation for flexible sigmoidoscopy, she did pass some blood at that point.  Dr. Lina Sar saw the patient for evaluation.  Her GI history was reviewed and is detailed above.  Dr. Juanda Chance felt that the patient had some mild tenderness in the left lower quadrant on exam and was concerned that perhaps the bleeding represented a recurrence of her acute colitis.  However, the patient does describe that with her colitis she had bloody stools and was not passing pure blood per rectum.  She did undergo flexible sigmoidoscopy on October 03, 2000.  This study essentially showed some old blood in the distal colon with brown stool above this.  The actual source for the bleeding was never found.  However, she did have scattered diverticula within the left colon.  The mucosa was otherwise normal.  Dr. Juanda Chance felt that she probably had a diverticular source for her bleeding which had since resolved.  Hemoglobin went from 12.6 down to a nadir of 11.  On final assay, hemoglobin was 12.6.  She did not require any blood cell transfusions.  Blood pressure and pulse were stable.  The patients coagulation parameters were within normal limits.  However, she did have a slightly low potassium at 3.2, and this was treated with a single dose of oral potassium.  The patient had some hour or so of gaseous distention and abdominal distress following flexible sigmoidoscopy.  However, after an hour or so this resolved, and the patient was passing flatus without problems.  She had a solid lunch and was discharged to home in stable condition.  Plans for follow-up with this patient  were for p.r.n. follow-up with both Dr. Artis Flock and Dr. Juanda Chance.  DISCHARGE MEDICATIONS:  1. Norvasc 5 mg p.o. q.d. 2. Ranitidine 150 mg 1 p.o. one to two times daily p.r.n. for reflux symptoms. 3. Metamucil/generic cilium, use one time daily according to directions on the    bottle. 4. So far as her osteoarthritis is concerned, she was to use Tylenol as needed    for joint pain, headaches, etc. 5. She was to hold Celebrex 200 mg b.i.d. for the time being and to restart    this on October 17, 2000.  DIET:  She was told to follow a regular diet.  DISCHARGE INSTRUCTIONS:  She was advised to call the physician if she had any recurrent significant GI bleeding.  PATHOLOGY:  At this time, biopsies obtained during the colonoscopy were pending. DD:  10/03/00 TD:  10/04/00 Job: [id number]

## 2011-01-11 NOTE — Consult Note (Signed)
NAME:  Mary Cook, Mary Cook                       ACCOUNT NO.:  [id number]   MEDICAL RECORD NO.:  [id number]                   PATIENT TYPE:  REC   LOCATION:  FOOT                                 FACILITY:  [facility]   PHYSICIAN:  Jonelle Sports. Sevier, M.D.              DATE OF BIRTH:  Jan 27, 1931   DATE OF CONSULTATION:  09/08/2003  DATE OF DISCHARGE:                                   CONSULTATION   REFERRING PHYSICIAN:  Georgina Quint. Plotnikov, M.D.   HISTORY:  This 77 year old white female is seen at the courtesy of Dr.  Posey Rea for multiple plantar calluses.   The patient has a complex medical history centered around polymyalgia  rheumatica and a question of lupus.  She has never had vascular problems in  her feet, either arterial or venous, but has developed several areas of  callous formation on the plantar aspects of her feet, as well as on the tip  of the left third toe.  Several years ago she sustained a semi avulsive-type  injury to the nail of the left great toe, which remained in place, but  became some dystrophic and now clearly mycotic.  She says that occasionally  there is some pain and redness around this nail, but at the moment it is  fairly quiet.   She is here now for our assistance with the plantar calluses.   PAST MEDICAL HISTORY:  Notable for those diseases as mentioned in the  present illness, as well as for type 2 diabetes, which is said to be in good  control, hypertension, some asthma, and a history of diverticulitis.   ALLERGIES:  She has no known medicinal allergies.   MEDICATIONS:  Her regular medications include __________ estrogen daily,  glucosamine plus, Zestoretic, prednisone 5 mg daily, baby aspirin daily,  glyburide, Prilosec, Glucophage XR, Normodyne, Zocor, and vitamins C, E, and  [name].   PHYSICAL EXAMINATION:  Examination today is limited to the distal lower  extremities.  The feet are without gross deformity, although there is some  beginning clawing  of the nails on the left.  The nail of the left hallux is  noted to be hypertrophic and mycotic whereas the remainder of the nails look  reasonably healthy.  Skin temperatures in the feet are normal and  symmetrical.  Pulses are palpable everywhere and there is hair growth on the  toes.  Monofilament testing shows protective sensation to be intact  throughout all surfaces of both feet.   The skin of the feet is rather dry and there is particular scaly dryness of  the heels bilaterally.  There is significant callous formation at the  interphalangeal joint of the halluces bilaterally on the plantar aspect.  There are lesions suspicious for plantar warts underlying the segment  metatarsal head on the left, the third metatarsal head on the left, and two  locations in the right posterior mid foot.  There is dense callous  on the  tip of the left third toe.   DISPOSITION:  1. The patient is given instruction regarding foot care by video with     nursing/physician reinforcement.  2. The scaly calloused areas on the heels bilaterally are ___________ as are     some of the lesser calluses, particularly in the first metatarsal head     area on the left.  3. The calluses on the interphalangeal aspects of the halluces bilaterally     are pared without incident.  4. The areas of apparent plantar wart formation underlying the second     metatarsal head on the right with the third metatarsal head on the left     and at two locations on the plantar aspect of the posterior right mid     foot are pared and excavated without difficulty.  5. The callous on the tip of the left third toe is sharply pared without     incident.  6. The areas of suspected plantar wart formation, as well as the calloused     area on the tip of the left third toe are treated with 15% salicylic acid     and collodium.  7. The patient's footwear was evaluated and found to be generally too small     and too narrow.  A long  discussion was held with her about appropriate     footwear, suggesting that she go at least one-half size larger, that she     avoid pointed toe shoes, and that she look for bands, such as New     Balance, which will provide more width in the forefoot area.  8. The patient was given a followup visit for three weeks for retreatment of     the plantar wart areas.                                               Jonelle Sports. Cheryll Cockayne, M.D.    RES/MEDQ  D:  09/08/2003  T:  09/08/2003  Job:  [id number]   cc:   Georgina Quint. Plotnikov, M.D. [facility]

## 2011-01-11 NOTE — Op Note (Signed)
NAME:  Mary Cook, Mary Cook           ACCOUNT NO.:  [id number]   MEDICAL RECORD NO.:  [id number]          PATIENT TYPE:  AMB   LOCATION:  ENDO                         FACILITY:  [facility]   PHYSICIAN:  Anselmo Rod, M.D.  DATE OF BIRTH:  01/19/1926   DATE OF PROCEDURE:  11/13/2005  DATE OF DISCHARGE:                                 OPERATIVE REPORT   PROCEDURE PERFORMED:  Screening colonoscopy.   ENDOSCOPIST:  Anselmo Rod, M.D.   INSTRUMENT USED:  Olympus video colonoscope.   INDICATIONS FOR PROCEDURE:  The patient is a 77 year old white female  undergoing screening colonoscopy for family history of colon cancer in her  sister.  She also has a personal history of colonic polyps.  Rule out  colonic polyps, masses, etc.   PREPROCEDURE PREPARATION:  Informed consent was procured from the patient.  The patient was fasted for four hours prior to the procedure and prepped  with OsmoPrep pills the night of and the morning of the procedure.  The  risks and benefits of the procedure including a 10% miss rate for cancer or  polyps was discussed with the patient as well.   PREPROCEDURE PHYSICAL:  The patient had stable vital signs.  Neck supple.  Chest clear to auscultation.  S1 and S2 regular.  Abdomen soft with normal  bowel sounds.   DESCRIPTION OF PROCEDURE:  The patient was placed in left lateral decubitus  position and sedated with 100 mcg of fentanyl and 8 mg of Versed in slow  incremental doses.  Once the patient was adequately sedated and maintained  on low flow oxygen and continuous cardiac monitoring, the Olympus video  colonoscope was advanced from the rectum to the cecum.  The appendicular  orifice and ileocecal valve were visualized and photographed.  There was a  significant amount of residual stool in the right colon.  Multiple washes  were done.  No masses, polyps, erosions, or ulcerations were seen.  There  was evidence of sigmoid diverticulosis.  Retroflexion in the  rectum revealed  no abnormalities.   IMPRESSION:  1.  Sigmoid diverticulosis, otherwise unrevealing exam up to the cecum.  2.  Some residual stool in the colon. Small lesions could be missed.  3.  No masses or polyps seen.   RECOMMENDATIONS:  1.  Continue high fiber diet with liberal fluid intake.  2.  Brochures on diverticulosis have been given to the patient for her      education.  3.  Outpatient followup as need arises in the future.      Anselmo Rod, M.D.  Electronically Signed     JNM/MEDQ  D:  11/13/2005  T:  11/14/2005  Job:  [id number]   cc:   Georgina Quint. Plotnikov, M.D. [facility]  [address]  [place]  [place] [address]

## 2011-01-11 NOTE — Discharge Summary (Signed)
[place]  Patient:    PARKER, SAWATZKY Visit Number: [id number] MRN: [id number]          Service Type: Attending:  Marlan Palau, M.D. Dictated by:   Marlan Palau, M.D.   CC:         Quita Skye. Artis Flock, M.D.  [facility].   Discharge Summary  ADMISSION DIAGNOSES: 1. Transient episode of left sided weakness probable transient ischemic    attack. 2. History of hypertension.  DISCHARGE DIAGNOSES: 1. Right parietal stroke. 2. Hypertension. 3. Gastroesophageal reflux disease. 4. Degenerative arthritis.  PROCEDURES DONE THIS ADMISSION: 1. CT of the head. 2. MRI of the brain. 3. MRI angiogram. 4. Carotid Doppler study. 5. 2D echocardiogram.  COMPLICATIONS OF ABOVE PROCEDURES:  None.  HISTORY OF PRESENT ILLNESS:  Mrs. Vaca is a 77 year old right handed white female born on July 13, 1926 with a history of hypertension who comes to [facility] after awakening up on the evening prior to admission with inability to communicate, left greater than right facial droop. The patient had difficulty with coordination and could not think about what she wanted to say.  She was not able to communicate effectively.  The patient was brought to the emergency room for evaluation but the deficits cleared over a period of time.  The patient was admitted for further evaluation and probable TIA event.  PAST MEDICAL HISTORY:  Significant for: 1. History of hypertension. 2. Gastroesophageal reflux disease. 3. Degenerative arthritis. 4. Low back pain. 5. History of chronic bronchitis. 6. Mild obesity. 7. New onset right parietal stroke as above.  MEDICATIONS AT THIS TIME: Norvasc 10 mg a day.  Celebrex 200 mg a day. Prilosec 20 mg a day.  PAST SURGICAL HISTORY:  The patient has had an appendectomy in the past. Hysterectomy in the past. Hemorrhoid surgery in the past.  ALLERGIES:  The patient has no known  allergies.  HABITS:  Does not smoke or drink.  Please refer to History and Physical dictation for social history, review of systems and physical examination.  LABORATORY DATA:  Notable for cholesterol of 258, triglycerides 186.  HDL cholesterol 66, LDL 155.  White count 5.9, hemoglobin 14.0, hematocrit 40.0, MCV 87.8, platelets 224.  Coags unremarkable.  INR 1.0.  Sodium 140, potassium 3.0 initially.  Chloride 104, glucose 135, BUN 16.  EKG reveals normal sinus rhythm.  Heart rate of 65.  CT of the chest was unremarkable.  CT of the head shows changes consistent with some small vessel ischemic changes.  No acute changes were noted.  HOSPITAL COURSE:  The patient was admitted to [facility] for evaluation of the above event.  The patient underwent an MRI scan of the brain that shows mild atrophy, extensive small vessel ischemic changes noted.  The patient has acute right posterior pariteal/frontal infarct with adjacent subcortical white matter disease.  MRI angiogram shows mild incidental atherosclerotic changes.  No significant stenosis seen in larger vessels.  The patient underwent a carotid Doppler study that was unremarkable.  2D echocardiogram was also performed and revealed an ejection fraction of 55 to 65%, aortic valve thickness was mildly increased.  There was moderate mitral annular calcification.  Left atrium was mildly dilated.  The patient had not been on any blood thinners or antiplatelet agents prior to this admission.  He was placed on 81 mg enteric coated aspirin one a day.  The patient was on Plavix as well for a short  period of time.  Plans were for discharge on Norvasc 10 mg one a day.  Coated aspirin 81 mg one a day.  Celebrex 200 mg daily. Prilosec 20 mg daily.  The patient is to have no strenuous activity for three to four weeks.  To have no added salt low fat diet.  Follow up with Dr. Anne Hahn in three weeks following discharge.  The patient may  require stat medication for cholesterol.  She should follow up with Dr. Artis Flock for this purpose.  If blood pressure management is altered would consider use of angiotensin converting enzyme inhibitor due to its benefit with stroke risk reduction.  The patient did experience a migraine headache during this admission with the right frontotemporal pain.  This is resolved.  The patient does have a prior history of migraines.  The patient will follow up with [facility] in three weeks. Dictated by:   Marlan Palau, M.D. Attending:  Marlan Palau, M.D. DD:  06/04/01 TD:  06/04/01 Job: [id number]

## 2011-01-11 NOTE — Discharge Summary (Signed)
Mary Cook, Mary Cook           ACCOUNT NO.:  [id number]   MEDICAL RECORD NO.:  [id number]          PATIENT TYPE:  INP   LOCATION:  3714                         FACILITY:  [facility]   PHYSICIAN:  Georgina Quint. Plotnikov, M.D. Romualdo Bolk OF BIRTH:  1926-08-21   DATE OF ADMISSION:  03/27/2005  DATE OF DISCHARGE:                                 DISCHARGE SUMMARY   ADDENDUM   A CT abdomen and pelvis was performed to rule out any abdominal etiology of  back pain.  CT abdomen and pelvis did note some free fluid in the sigmoid  colon in the setting of diverticulosis and moderate celiac stenosis.  In  addition, an x-ray of the spine was performed which showed mild loss of  height of the L1 vertebral body and multilevel spondylosis.  At this time,  the patient has no further back or chest pain.  Plan  to discharge to home  and the patient is to follow up with Dr. Posey Rea and to have an outpatient  stress test performed.      Katrinka Blazing   MSO/MEDQ  D:  03/29/2005  T:  03/30/2005  Job:  [id number]

## 2011-01-11 NOTE — Discharge Summary (Signed)
NAMECARRISA, Mary Cook           ACCOUNT NO.:  [id number]   MEDICAL RECORD NO.:  [id number]          PATIENT TYPE:  INP   LOCATION:  3714                         FACILITY:  [facility]   PHYSICIAN:  Rene Paci, M.D. LHCDATE OF BIRTH:  1926-04-15   DATE OF ADMISSION:  03/27/2005  DATE OF DISCHARGE:  03/28/2005                                 DISCHARGE SUMMARY   DISCHARGE DIAGNOSIS:  Atypical chest pain.   HISTORY OF PRESENT ILLNESS:  The patient is a 77 year old female who  presented with complaints of chest pain radiating beneath the left scapula,  which had started four days prior to admission and eventually had become so  intense that she sought medical attention.  The patient was admitted for  further evaluation and to rule out cardiac etiology.   PAST MEDICAL HISTORY:  1.  Diabetes.  2.  Hypertension.  3.  Osteoarthritis.  4.  History of CVA.  5.  History of hypertension.   COURSE OF HOSPITALIZATION:  ATYPICAL CHEST PAIN:  The patient was admitted and was also complaining of  nausea at time of admission though denied vomiting.  The patient underwent  serial cardiac enzymes, which were negative x3.  In addition, the patient  was not noted to have any elevated LFTs.  A CT angiography was performed,  which was negative for PE.  The patient did describe that this pain is  similar to the pain that she has had in her neck; however, it is more  severe.  On exam today, the patient was noted to have some tenderness  beneath the left scapula, however was negative for CVA tenderness.  A  urinalysis was performed at the time of admission, which was negative and  did not show any microscopic hematuria.  The patient is afebrile and does  not have a white count.  An EKG was performed which was normal except for  mild LVH.  At this time the patient denies any further chest pain, just has  mild tenderness beneath the left scapula.  Abdominal exam is benign.  I have  requested an  outpatient stress test be performed, as the patient has a  history of diabetes and dyslipidemia, and at this time I am waiting for  exact date and time for outpatient stress test.   MEDICATIONS AT DISCHARGE:  1.  Norvasc 10 mg p.o. daily.  2.  Crestor 20 mg p.o. daily.  3.  Normodyne 400 mg p.o. twice daily.  4.  Glucophage XR 500 mg p.o. daily.  The patient is not to take for 48      hours secondary to recent contrast for CT angiography.  5.  Prilosec 20 mg p.o. daily.  6.  Zestoretic 20/12.5 mg p.o. daily.  7.  A baby aspirin 81 mg p.o. daily has been added.   A follow-up appointment has been scheduled for the patient to follow up with  Dr. Posey Rea on August 15 at 11:15 a.m.  In addition, we are arranging an  outpatient stress test.  The patient is to contact Dr. Posey Rea or to go to  the emergency room should she  develop severe chest pain or fever.      Melissa S. Peggyann Juba, NP      Rene Paci, M.D. [facility]  Electronically Signed    MSO/MEDQ  D:  03/28/2005  T:  03/29/2005  Job:  [id number]

## 2011-01-11 NOTE — H&P (Signed)
[place]  Patient:    Mary Cook, Mary Cook                    MRN: [id number] Adm. Date:  [id number] Attending:  Barkley Bruns CC:         Quita Skye. Artis Flock, M.D.   History and Physical  CHIEF COMPLAINT:  Passing blood.  HISTORY OF PRESENT ILLNESS:  The patient is a 77 year old female who passed a large volume of clear bright red blood at 11 p.m. last night.  She subsequently passed two more episodes of large volume bright red blood in the hospital.  She denies fever, chills, abdominal pain, nausea or vomiting.  She did note some abdominal bloating and burning of the esophageal area.  The patient estimates that she passed one pint of blood in all.  She has had no prior history of GI bleeding, ulcer or diverticulosis.  She does have a hiatal hernia.  PAST MEDICAL HISTORY:  Surgeries: The patient has had appendectomy, hemorrhoidectomy and cyst removal from the right hand.  OTHER HOSPITALIZATIONS:  She was hospitalized in 1998 for colitis by Dr. Lina Sar.  OTHER ILLNESSES:  She has had hypertension for many years and also osteoarthritis for years.  She sees Dr. Concepcion Living at [facility] for osteoarthritis.  MEDICATIONS: 1. Norvasc 5 mg q.d. 2. Celebrex 200 mg b.i.d.  ALLERGIES:  None.  FAMILY HISTORY:  Her mother died age 53 with diabetes and heart problem.  The father died age 33 of heart problem.  She has a complicated family history.  She states that she has 26 siblings or half-siblings in all.  Whole siblings two brothers and two sisters are dead of leukemia, bone cancer, colon cancer and renal cancer.  Nine of her whole siblings are alive.  One has diabetes, one has heart condition, one has kidney problems, one has hypertension, one has rheumatoid arthritis.  Of her half-siblings, all are deceased and she cannot remember all of the causes of their deaths.  She has 13 half-siblings in all.  She has one daughter who died of lung  cancer.  SOCIAL HISTORY:  The patient is married.  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  She has never used alcohol or tobacco.  REVIEW OF SYSTEMS:  No systemic complaints.  She does have psoriasis.  Denies ENT or eye problems.  She does have a history of asthma in the past but it not troubled by this recently.  She denies cardiovascular, GU, musculoskeletal or neurological complaints.  PHYSICAL EXAMINATION:  VITAL SIGNS:  Blood pressure supine is 176/86, sitting 182/84, standing 168/84.  Pulse supine is 84, sitting 72, standing 84.  Respirations are 18, temperature 98.6.  GENERAL:  She is alert in no distress.  SKIN:  Warm and dry no rash.  HEENT:  Eyes revealed pupils are equal, round and reactive to light.  Full EOMs, fundi are benign and sclerae are nonicteric.  Tympanic membranes are normal, no intraoral lesions, pharynx is clear.  Mucous membranes are moist.  NECK:  Supple.  No adenopathy, jugular venous distension or bruits.  The thyroid is normal.  LUNGS:  Clear to auscultation and percussion.  HEART:  Regular rate and rhythm with no murmur or gallop.  ABDOMEN:  Soft and nontender, no organomegaly or masses.  Bowel sounds are normoactive.  RECTAL:  No masses or tenderness and there was gross bright red blood on the glove.  EXTREMITIES:  No edema.  Pedal  pulses are not felt.  NEUROLOGIC:  Alert and oriented x three.  Speech was clear and appropriate. No extremity weakness or tremor.  Deep tendon reflexes are 2+ and symmetric. Babinskis downgoing and cranial nerves are intact.  LABORATORY:  CBC showed hemoglobin of 12.6, white count 7000, pro time INR was 0.9.  ADMITTING IMPRESSION: 1. Lower gastrointestinal bleed possible diverticular angiodysplasia or upper    gastrointestinal source. 2. Hypertension. 3. Osteoarthritis.  PLAN: 1. Follow CBC. 2. Packed RBCs if hemoglobin less than 8. 3. IV Pepcid. 4. Gastrointestinal consultation. DD:   10/02/00 TD:  10/02/00 Job: [id number]

## 2011-01-22 ENCOUNTER — Other Ambulatory Visit: Payer: Medicare PPO

## 2011-01-22 ENCOUNTER — Encounter: Payer: Self-pay | Admitting: Internal Medicine

## 2011-01-22 ENCOUNTER — Other Ambulatory Visit: Payer: Self-pay | Admitting: Internal Medicine

## 2011-01-22 DIAGNOSIS — E119 Type 2 diabetes mellitus without complications: Secondary | ICD-10-CM

## 2011-01-22 DIAGNOSIS — E538 Deficiency of other specified B group vitamins: Secondary | ICD-10-CM

## 2011-01-22 DIAGNOSIS — I2589 Other forms of chronic ischemic heart disease: Secondary | ICD-10-CM

## 2011-01-23 ENCOUNTER — Ambulatory Visit: Payer: Medicare PPO | Admitting: Internal Medicine

## 2011-04-19 ENCOUNTER — Encounter: Payer: Self-pay | Admitting: Internal Medicine

## 2011-07-01 ENCOUNTER — Other Ambulatory Visit: Payer: Self-pay | Admitting: *Deleted

## 2011-07-01 MED ORDER — NITROGLYCERIN 0.4 MG SL SUBL: 0.4000 mg | SUBLINGUAL_TABLET | SUBLINGUAL | Status: DC | PRN

## 2011-07-01 MED ORDER — PANTOPRAZOLE SODIUM 40 MG PO TBEC: 40.0000 mg | DELAYED_RELEASE_TABLET | Freq: Every day | ORAL | Status: DC

## 2011-07-01 MED ORDER — ISOSORBIDE DINITRATE 30 MG PO TABS: 30.0000 mg | ORAL_TABLET | Freq: Four times a day (QID) | ORAL | Status: DC | PRN

## 2011-07-01 MED ORDER — TRAMADOL HCL 50 MG PO TABS: 50.0000 mg | ORAL_TABLET | Freq: Two times a day (BID) | ORAL | Status: DC | PRN

## 2011-07-01 MED ORDER — CARVEDILOL 25 MG PO TABS: 25.0000 mg | ORAL_TABLET | Freq: Two times a day (BID) | ORAL | Status: DC

## 2011-07-01 MED ORDER — CITALOPRAM HYDROBROMIDE 20 MG PO TABS: 20.0000 mg | ORAL_TABLET | Freq: Every day | ORAL | Status: DC

## 2011-07-01 MED ORDER — AMLODIPINE BESY-BENAZEPRIL HCL 10-20 MG PO CAPS: 1.0000 | ORAL_CAPSULE | Freq: Every day | ORAL | Status: DC

## 2011-07-01 MED ORDER — GLYBURIDE-METFORMIN 2.5-500 MG PO TABS: 1.0000 | ORAL_TABLET | Freq: Two times a day (BID) | ORAL | Status: DC

## 2011-07-01 MED ORDER — FUROSEMIDE 40 MG PO TABS: 40.0000 mg | ORAL_TABLET | Freq: Every day | ORAL | Status: DC

## 2011-07-01 MED ORDER — B-12 3000 MCG SL SUBL: 1.0000 | SUBLINGUAL_TABLET | Freq: Every day | SUBLINGUAL | Status: DC

## 2011-07-01 MED ORDER — FLUTICASONE PROPIONATE 50 MCG/ACT NA SUSP: 2.0000 | Freq: Every day | NASAL | Status: DC

## 2011-07-05 ENCOUNTER — Other Ambulatory Visit: Payer: Self-pay | Admitting: Internal Medicine

## 2011-07-05 ENCOUNTER — Telehealth: Payer: Self-pay | Admitting: *Deleted

## 2011-07-05 NOTE — Telephone Encounter (Signed)
Pls ask what she has been taking Thx

## 2011-07-05 NOTE — Telephone Encounter (Signed)
Pt is requesting Rf on metformin 500 mg 1 po bid to [facility]. This med is not active. Should she be taking this or Glyburide-metformin 2.5-500 po bid? Please advise.

## 2011-07-09 MED ORDER — METFORMIN HCL 500 MG PO TABS: 500.0000 mg | ORAL_TABLET | Freq: Two times a day (BID) | ORAL | Status: DC

## 2011-07-09 NOTE — Telephone Encounter (Signed)
Per pt- she has been taking Metformin 500 mg 1 po bid. Rf sent to mail order and 30 day supply sent to local. Pt informed

## 2011-07-19 ENCOUNTER — Ambulatory Visit (INDEPENDENT_AMBULATORY_CARE_PROVIDER_SITE_OTHER): Payer: Medicare PPO | Admitting: Internal Medicine

## 2011-07-19 VITALS — BP 128/72 | HR 65 | Temp 98.8°F | Wt 172.0 lb

## 2011-07-19 DIAGNOSIS — Z0000 Encounter for general adult medical examination without abnormal findings: Secondary | ICD-10-CM

## 2011-07-19 DIAGNOSIS — E119 Type 2 diabetes mellitus without complications: Secondary | ICD-10-CM

## 2011-07-19 DIAGNOSIS — E538 Deficiency of other specified B group vitamins: Secondary | ICD-10-CM

## 2011-07-19 DIAGNOSIS — Z23 Encounter for immunization: Secondary | ICD-10-CM

## 2011-07-19 DIAGNOSIS — E559 Vitamin D deficiency, unspecified: Secondary | ICD-10-CM

## 2011-07-19 DIAGNOSIS — I10 Essential (primary) hypertension: Secondary | ICD-10-CM

## 2011-07-19 DIAGNOSIS — I2510 Atherosclerotic heart disease of native coronary artery without angina pectoris: Secondary | ICD-10-CM

## 2011-07-19 MED ORDER — FUROSEMIDE 40 MG PO TABS: 40.0000 mg | ORAL_TABLET | Freq: Every day | ORAL | Status: DC

## 2011-07-19 NOTE — Assessment & Plan Note (Signed)
Continue with current prescription therapy as reflected on the Med list.  

## 2011-07-19 NOTE — Progress Notes (Signed)
Subjective:    Patient ID: Mary Cook, female    DOB: 11/25/1925, 77 y.o.   MRN: [id number]  HPI The patient is here for a wellness exam. The patient has been doing well overall without major physical or psychological issues going on lately. The patient needs to address  chronic hypertension that has been well controlled with medicines; to address chronic  hyperlipidemia controlled with medicines as well; and to address type 2 chronic diabetes, CAD, controlled with medical treatment and diet.   She fell in Sept - tripped at the restaurant     Review of Systems  Constitutional: Negative for chills, activity change, appetite change, fatigue and unexpected weight change.  HENT: Negative for congestion, mouth sores and sinus pressure.   Eyes: Negative for visual disturbance.  Respiratory: Negative for cough and chest tightness.   Gastrointestinal: Negative for nausea and abdominal pain.  Genitourinary: Negative for frequency, difficulty urinating and vaginal pain.  Musculoskeletal: Negative for back pain and gait problem.  Skin: Negative for pallor and rash.  Neurological: Negative for dizziness, tremors, weakness, numbness and headaches.  Psychiatric/Behavioral: Negative for confusion and sleep disturbance.       Objective:   Physical Exam  Constitutional: She appears well-developed. No distress.       obese  HENT:  Head: Normocephalic.  Right Ear: External ear normal.  Left Ear: External ear normal.  Nose: Nose normal.  Mouth/Throat: Oropharynx is clear and moist.  Eyes: Conjunctivae are normal. Pupils are equal, round, and reactive to light. Right eye exhibits no discharge. Left eye exhibits no discharge.  Neck: Normal range of motion. Neck supple. No JVD present. No tracheal deviation present. No thyromegaly present.  Cardiovascular: Normal rate, regular rhythm and normal heart sounds.   Pulmonary/Chest: No stridor. No respiratory distress. She has no wheezes.    Abdominal: Soft. Bowel sounds are normal. She exhibits no distension and no mass. There is no tenderness. There is no rebound and no guarding.  Musculoskeletal: She exhibits no edema and no tenderness.  Lymphadenopathy:    She has no cervical adenopathy.  Neurological: She displays normal reflexes. No cranial nerve deficit. She exhibits normal muscle tone. Coordination normal.  Skin: No rash noted. No erythema.  Psychiatric: She has a normal mood and affect. Her behavior is normal. Judgment and thought content normal.    Lab Results  Component Value Date   WBC 6.3 11/06/2010   HGB 10.2* 11/06/2010   HCT 30.3* 11/06/2010   PLT 148* 11/06/2010   GLUCOSE 163* 11/06/2010   CHOL 249* 10/15/2010   TRIG 108 10/15/2010   HDL 57 10/15/2010   LDLDIRECT 162.7 03/19/2010   LDLCALC 170* 10/15/2010   ALT 12 10/15/2010   AST 15 10/15/2010   NA 140 11/06/2010   K 3.4* 11/06/2010   CL 102 11/06/2010   CREATININE 1.00 11/06/2010   BUN 12 11/06/2010   CO2 31 11/06/2010   TSH 2.082 10/15/2010   INR 0.96 07/14/2010   HGBA1C  Value: 6.3 (NOTE)                                                                       According to the ADA Clinical Practice Recommendations for 2011, when HbA1c is used as a  screening test:   >=6.5%   Diagnostic of Diabetes Mellitus           (if abnormal result  is confirmed)  5.7-6.4%   Increased risk of developing Diabetes Mellitus  References:Diagnosis and Classification of Diabetes Mellitus,Diabetes Care,2011,34(Suppl 1):S62-S69 and Standards of Medical Care in         Diabetes - 2011,Diabetes Care,2011,34  (Suppl 1):S11-S61.* 05/10/2010         Assessment & Plan:

## 2011-07-21 ENCOUNTER — Encounter: Payer: Self-pay | Admitting: Internal Medicine

## 2011-07-23 ENCOUNTER — Other Ambulatory Visit (INDEPENDENT_AMBULATORY_CARE_PROVIDER_SITE_OTHER): Payer: Medicare PPO

## 2011-07-23 ENCOUNTER — Other Ambulatory Visit: Payer: Self-pay | Admitting: Internal Medicine

## 2011-07-23 DIAGNOSIS — I10 Essential (primary) hypertension: Secondary | ICD-10-CM

## 2011-07-23 DIAGNOSIS — E538 Deficiency of other specified B group vitamins: Secondary | ICD-10-CM

## 2011-07-23 DIAGNOSIS — I2510 Atherosclerotic heart disease of native coronary artery without angina pectoris: Secondary | ICD-10-CM

## 2011-07-23 DIAGNOSIS — E119 Type 2 diabetes mellitus without complications: Secondary | ICD-10-CM

## 2011-07-23 LAB — URINALYSIS
Bilirubin Urine: NEGATIVE
Hgb urine dipstick: NEGATIVE
Ketones, ur: NEGATIVE
Leukocytes, UA: NEGATIVE
Nitrite: NEGATIVE
Total Protein, Urine: NEGATIVE
pH: 6.5 (ref 5.0–8.0)

## 2011-07-23 LAB — BASIC METABOLIC PANEL
BUN: 20 mg/dL (ref 6–23)
CO2: 27 mEq/L (ref 19–32)
Calcium: 9.2 mg/dL (ref 8.4–10.5)
Creatinine, Ser: 1.1 mg/dL (ref 0.4–1.2)
GFR: 50.92 mL/min — ABNORMAL LOW (ref >60.00)
Glucose, Bld: 129 mg/dL — ABNORMAL HIGH (ref 70–99)
Sodium: 143 mEq/L (ref 135–145)

## 2011-07-23 LAB — HEMOGLOBIN A1C: Hgb A1c MFr Bld: 6.3 % (ref 4.6–6.5)

## 2011-07-23 LAB — CBC WITH DIFFERENTIAL/PLATELET
Basophils Absolute: 0 10*3/uL (ref 0.0–0.1)
Eosinophils Absolute: 0.1 10*3/uL (ref 0.0–0.7)
Hemoglobin: 11 g/dL — ABNORMAL LOW (ref 12.0–15.0)
Lymphocytes Relative: 19.6 % (ref 12.0–46.0)
MCHC: 34.3 g/dL (ref 30.0–36.0)
Monocytes Relative: 9.8 % (ref 3.0–12.0)
Neutrophils Relative %: 67.8 % (ref 43.0–77.0)
Platelets: 170 10*3/uL (ref 150.0–400.0)
RDW: 12.9 % (ref 11.5–14.6)

## 2011-07-23 LAB — HEPATIC FUNCTION PANEL
ALT: 13 U/L (ref 0–35)
AST: 16 U/L (ref 0–37)
Total Bilirubin: 0.7 mg/dL (ref 0.3–1.2)
Total Protein: 6.7 g/dL (ref 6.0–8.3)

## 2011-07-23 LAB — LIPID PANEL
Cholesterol: 254 mg/dL — ABNORMAL HIGH (ref 0–200)
HDL: 69.4 mg/dL (ref >39.00)
Triglycerides: 70 mg/dL (ref 0.0–149.0)

## 2011-07-23 LAB — LDL CHOLESTEROL, DIRECT: Direct LDL: 177.7 mg/dL

## 2011-07-23 LAB — TSH: TSH: 1.47 u[IU]/mL (ref 0.35–5.50)

## 2011-07-24 ENCOUNTER — Telehealth: Payer: Self-pay | Admitting: Internal Medicine

## 2011-07-24 MED ORDER — CYANOCOBALAMIN 1000 MCG/ML IJ SOLN: INTRAMUSCULAR | Status: DC

## 2011-07-24 NOTE — Telephone Encounter (Signed)
Mary Cook, please, inform patient that all labs are normal except for low Vit B 12 and elev chol Needs a nurse visit to start [name] shots Keeep ROV Thx

## 2011-07-25 NOTE — Telephone Encounter (Signed)
Left mess for patient to call back.  

## 2011-07-25 NOTE — Telephone Encounter (Signed)
Left detailed mess informing pt of below.  

## 2011-07-27 HISTORY — PX: CARDIAC CATHETERIZATION: SHX172

## 2011-08-02 ENCOUNTER — Telehealth: Payer: Self-pay | Admitting: Internal Medicine

## 2011-08-02 NOTE — Telephone Encounter (Signed)
Hasn't she been taking a [name] pill for the last few months? Thx

## 2011-08-02 NOTE — Telephone Encounter (Signed)
The pt called and asked if she could get a pill form of B-12 instead of the shots  If not, let me know and i'll be more than happy to call her back and set up her b-12 shot appts.   Thanks so much!

## 2011-08-05 MED ORDER — CYANOCOBALAMIN 1000 MCG SL SUBL: 1.0000 | SUBLINGUAL_TABLET | Freq: Every day | SUBLINGUAL | Status: DC

## 2011-08-05 NOTE — Telephone Encounter (Signed)
Left mess for patient to call back.  

## 2011-08-05 NOTE — Telephone Encounter (Signed)
Pt left vm stating she has not been taking a oral b12. But prefers oral over injections. Is this ok? Please advise.

## 2011-08-05 NOTE — Telephone Encounter (Signed)
Noted. OK to start SL [name] Thx

## 2011-08-06 NOTE — Telephone Encounter (Signed)
Left detailed mess informing pt of below.  

## 2011-08-17 ENCOUNTER — Inpatient Hospital Stay (HOSPITAL_COMMUNITY): Payer: Medicare PPO

## 2011-08-17 ENCOUNTER — Encounter (HOSPITAL_COMMUNITY): Payer: Self-pay | Admitting: *Deleted

## 2011-08-17 ENCOUNTER — Other Ambulatory Visit: Payer: Self-pay

## 2011-08-17 ENCOUNTER — Inpatient Hospital Stay (HOSPITAL_COMMUNITY)
Admission: EM | Admit: 2011-08-17 | Discharge: 2011-08-23 | DRG: 286 | Disposition: A | Discharge status: Home / Self Care | Payer: Medicare PPO | Attending: Internal Medicine | Admitting: Internal Medicine

## 2011-08-17 ENCOUNTER — Emergency Department (HOSPITAL_COMMUNITY): Payer: Medicare PPO

## 2011-08-17 DIAGNOSIS — R0602 Shortness of breath: Secondary | ICD-10-CM | POA: Diagnosis present

## 2011-08-17 DIAGNOSIS — D649 Anemia, unspecified: Secondary | ICD-10-CM | POA: Diagnosis present

## 2011-08-17 DIAGNOSIS — G4730 Sleep apnea, unspecified: Secondary | ICD-10-CM | POA: Diagnosis present

## 2011-08-17 DIAGNOSIS — I2589 Other forms of chronic ischemic heart disease: Secondary | ICD-10-CM | POA: Diagnosis present

## 2011-08-17 DIAGNOSIS — N179 Acute kidney failure, unspecified: Secondary | ICD-10-CM | POA: Diagnosis not present

## 2011-08-17 DIAGNOSIS — E538 Deficiency of other specified B group vitamins: Secondary | ICD-10-CM | POA: Diagnosis present

## 2011-08-17 DIAGNOSIS — F341 Dysthymic disorder: Secondary | ICD-10-CM | POA: Diagnosis present

## 2011-08-17 DIAGNOSIS — R570 Cardiogenic shock: Secondary | ICD-10-CM | POA: Diagnosis not present

## 2011-08-17 DIAGNOSIS — I5043 Acute on chronic combined systolic (congestive) and diastolic (congestive) heart failure: Principal | ICD-10-CM | POA: Diagnosis present

## 2011-08-17 DIAGNOSIS — G4733 Obstructive sleep apnea (adult) (pediatric): Secondary | ICD-10-CM | POA: Diagnosis present

## 2011-08-17 DIAGNOSIS — M899 Disorder of bone, unspecified: Secondary | ICD-10-CM | POA: Diagnosis present

## 2011-08-17 DIAGNOSIS — M949 Disorder of cartilage, unspecified: Secondary | ICD-10-CM | POA: Diagnosis present

## 2011-08-17 DIAGNOSIS — Z8673 Personal history of transient ischemic attack (TIA), and cerebral infarction without residual deficits: Secondary | ICD-10-CM

## 2011-08-17 DIAGNOSIS — M1990 Unspecified osteoarthritis, unspecified site: Secondary | ICD-10-CM | POA: Diagnosis present

## 2011-08-17 DIAGNOSIS — Z7982 Long term (current) use of aspirin: Secondary | ICD-10-CM

## 2011-08-17 DIAGNOSIS — J45909 Unspecified asthma, uncomplicated: Secondary | ICD-10-CM | POA: Diagnosis present

## 2011-08-17 DIAGNOSIS — R079 Chest pain, unspecified: Secondary | ICD-10-CM | POA: Diagnosis present

## 2011-08-17 DIAGNOSIS — Z79899 Other long term (current) drug therapy: Secondary | ICD-10-CM

## 2011-08-17 DIAGNOSIS — I10 Essential (primary) hypertension: Secondary | ICD-10-CM | POA: Diagnosis present

## 2011-08-17 DIAGNOSIS — I252 Old myocardial infarction: Secondary | ICD-10-CM

## 2011-08-17 DIAGNOSIS — E785 Hyperlipidemia, unspecified: Secondary | ICD-10-CM | POA: Diagnosis present

## 2011-08-17 DIAGNOSIS — R9431 Abnormal electrocardiogram [ECG] [EKG]: Secondary | ICD-10-CM | POA: Diagnosis present

## 2011-08-17 DIAGNOSIS — I509 Heart failure, unspecified: Secondary | ICD-10-CM | POA: Diagnosis present

## 2011-08-17 DIAGNOSIS — I959 Hypotension, unspecified: Secondary | ICD-10-CM | POA: Diagnosis not present

## 2011-08-17 DIAGNOSIS — I2510 Atherosclerotic heart disease of native coronary artery without angina pectoris: Secondary | ICD-10-CM | POA: Diagnosis present

## 2011-08-17 DIAGNOSIS — E119 Type 2 diabetes mellitus without complications: Secondary | ICD-10-CM | POA: Diagnosis present

## 2011-08-17 DIAGNOSIS — Z9861 Coronary angioplasty status: Secondary | ICD-10-CM

## 2011-08-17 HISTORY — DX: Cerebral infarction, unspecified: I63.9

## 2011-08-17 LAB — CBC
HCT: 30.3 % — ABNORMAL LOW (ref 36.0–46.0)
Hemoglobin: 10.3 g/dL — ABNORMAL LOW (ref 12.0–15.0)
MCH: 31.2 pg (ref 26.0–34.0)
MCHC: 34 g/dL (ref 30.0–36.0)
MCV: 91.8 fL (ref 78.0–100.0)
Platelets: 156 K/uL (ref 150–400)
RBC: 3.3 MIL/uL — ABNORMAL LOW (ref 3.87–5.11)
RDW: 12.8 % (ref 11.5–15.5)
WBC: 7 K/uL (ref 4.0–10.5)

## 2011-08-17 LAB — BASIC METABOLIC PANEL WITH GFR
BUN: 22 mg/dL (ref 6–23)
CO2: 26 meq/L (ref 19–32)
Calcium: 9.1 mg/dL (ref 8.4–10.5)
Chloride: 105 meq/L (ref 96–112)
Creatinine, Ser: 1.08 mg/dL (ref 0.50–1.10)
GFR calc Af Amer: 53 mL/min — ABNORMAL LOW (ref >90)
GFR calc non Af Amer: 46 mL/min — ABNORMAL LOW (ref >90)
Glucose, Bld: 175 mg/dL — ABNORMAL HIGH (ref 70–99)
Potassium: 3.7 meq/L (ref 3.5–5.1)
Sodium: 138 meq/L (ref 135–145)

## 2011-08-17 LAB — CARDIAC PANEL(CRET KIN+CKTOT+MB+TROPI)
CK, MB: 3.6 ng/mL (ref 0.3–4.0)
Relative Index: INVALID (ref 0.0–2.5)
Total CK: 94 U/L (ref 7–177)
Troponin I: 0.3 ng/mL (ref <0.30)

## 2011-08-17 LAB — DIFFERENTIAL
Basophils Absolute: 0 10*3/uL (ref 0.0–0.1)
Basophils Relative: 0 % (ref 0–1)
Eosinophils Relative: 2 % (ref 0–5)
Monocytes Absolute: 0.4 10*3/uL (ref 0.1–1.0)
Neutro Abs: 5.5 10*3/uL (ref 1.7–7.7)

## 2011-08-17 LAB — LIPID PANEL
HDL: 72 mg/dL (ref >39)
LDL Cholesterol: 136 mg/dL — ABNORMAL HIGH (ref 0–99)
Total CHOL/HDL Ratio: 3.1 RATIO
Triglycerides: 88 mg/dL (ref <150)
VLDL: 18 mg/dL (ref 0–40)

## 2011-08-17 LAB — HEMOGLOBIN A1C
Hgb A1c MFr Bld: 6.5 % — ABNORMAL HIGH (ref <5.7)
Mean Plasma Glucose: 140 mg/dL — ABNORMAL HIGH (ref <117)

## 2011-08-17 LAB — D-DIMER, QUANTITATIVE: D-Dimer, Quant: 1.4 ug{FEU}/mL — ABNORMAL HIGH (ref 0.00–0.48)

## 2011-08-17 LAB — PRO B NATRIURETIC PEPTIDE: Pro B Natriuretic peptide (BNP): 2431 pg/mL — ABNORMAL HIGH (ref 0–450)

## 2011-08-17 LAB — INFLUENZA PANEL BY PCR (TYPE A & B)
H1N1 flu by pcr: NOT DETECTED
Influenza B By PCR: NEGATIVE

## 2011-08-17 LAB — GLUCOSE, CAPILLARY

## 2011-08-17 LAB — POCT I-STAT TROPONIN I: Troponin i, poc: 0.03 ng/mL (ref 0.00–0.08)

## 2011-08-17 MED ORDER — IOHEXOL 350 MG/ML SOLN
100.0000 mL | Freq: Once | INTRAVENOUS | Status: AC | PRN
  Administered 2011-08-17: 100 mL via INTRAVENOUS

## 2011-08-17 MED ORDER — METFORMIN HCL 500 MG PO TABS
500.0000 mg | ORAL_TABLET | Freq: Two times a day (BID) | ORAL | Status: DC
  Administered 2011-08-17: 500 mg via ORAL
  Filled 2011-08-17: qty 1

## 2011-08-17 MED ORDER — ALBUTEROL SULFATE (5 MG/ML) 0.5% IN NEBU
INHALATION_SOLUTION | RESPIRATORY_TRACT | Status: AC
  Filled 2011-08-17: qty 1

## 2011-08-17 MED ORDER — IPRATROPIUM BROMIDE 0.02 % IN SOLN: 0.5000 mg | Freq: Once | RESPIRATORY_TRACT | Status: DC

## 2011-08-17 MED ORDER — IPRATROPIUM BROMIDE 0.02 % IN SOLN
RESPIRATORY_TRACT | Status: AC
  Administered 2011-08-17: 0.5 mg via RESPIRATORY_TRACT
  Filled 2011-08-17: qty 2.5

## 2011-08-17 MED ORDER — POTASSIUM CHLORIDE CRYS ER 10 MEQ PO TBCR
10.0000 meq | EXTENDED_RELEASE_TABLET | Freq: Two times a day (BID) | ORAL | Status: DC
  Administered 2011-08-17 – 2011-08-18 (×3): 10 meq via ORAL
  Filled 2011-08-17 (×4): qty 1

## 2011-08-17 MED ORDER — FLUTICASONE PROPIONATE 50 MCG/ACT NA SUSP
2.0000 | Freq: Every day | NASAL | Status: DC
  Administered 2011-08-18 – 2011-08-23 (×6): 2 via NASAL
  Filled 2011-08-17 (×2): qty 16

## 2011-08-17 MED ORDER — ISOSORBIDE DINITRATE 30 MG PO TABS
30.0000 mg | ORAL_TABLET | Freq: Three times a day (TID) | ORAL | Status: DC
  Administered 2011-08-17 – 2011-08-18 (×3): 30 mg via ORAL
  Filled 2011-08-17: qty 2
  Filled 2011-08-17: qty 1
  Filled 2011-08-17: qty 2
  Filled 2011-08-17: qty 1
  Filled 2011-08-17: qty 2

## 2011-08-17 MED ORDER — METFORMIN HCL 500 MG PO TABS: 500.0000 mg | ORAL_TABLET | Freq: Two times a day (BID) | ORAL | Status: DC

## 2011-08-17 MED ORDER — TRAMADOL HCL 50 MG PO TABS
50.0000 mg | ORAL_TABLET | Freq: Two times a day (BID) | ORAL | Status: DC | PRN
  Filled 2011-08-17: qty 2

## 2011-08-17 MED ORDER — GLYBURIDE 5 MG PO TABS: 2.5000 mg | ORAL_TABLET | Freq: Two times a day (BID) | ORAL | Status: DC

## 2011-08-17 MED ORDER — ASPIRIN EC 81 MG PO TBEC
81.0000 mg | DELAYED_RELEASE_TABLET | Freq: Every day | ORAL | Status: DC
  Administered 2011-08-17 – 2011-08-23 (×7): 81 mg via ORAL
  Filled 2011-08-17 (×7): qty 1

## 2011-08-17 MED ORDER — AMLODIPINE BESY-BENAZEPRIL HCL 10-20 MG PO CAPS: 1.0000 | ORAL_CAPSULE | Freq: Every day | ORAL | Status: DC

## 2011-08-17 MED ORDER — ALBUTEROL SULFATE (5 MG/ML) 0.5% IN NEBU
5.0000 mg | INHALATION_SOLUTION | Freq: Once | RESPIRATORY_TRACT | Status: AC
  Administered 2011-08-17: 5 mg via RESPIRATORY_TRACT

## 2011-08-17 MED ORDER — FUROSEMIDE 10 MG/ML IJ SOLN
40.0000 mg | Freq: Once | INTRAMUSCULAR | Status: AC
  Administered 2011-08-17: 40 mg via INTRAVENOUS
  Filled 2011-08-17: qty 4

## 2011-08-17 MED ORDER — HEPARIN SODIUM (PORCINE) 5000 UNIT/ML IJ SOLN
5000.0000 [IU] | Freq: Three times a day (TID) | INTRAMUSCULAR | Status: DC
  Administered 2011-08-17 – 2011-08-18 (×3): 5000 [IU] via SUBCUTANEOUS
  Filled 2011-08-17 (×3): qty 1

## 2011-08-17 MED ORDER — INSULIN ASPART 100 UNIT/ML ~~LOC~~ SOLN
0.0000 [IU] | Freq: Three times a day (TID) | SUBCUTANEOUS | Status: DC
  Administered 2011-08-19 – 2011-08-20 (×2): 2 [IU] via SUBCUTANEOUS
  Administered 2011-08-20 – 2011-08-21 (×3): 1 [IU] via SUBCUTANEOUS
  Administered 2011-08-21: 2 [IU] via SUBCUTANEOUS
  Administered 2011-08-21 – 2011-08-22 (×4): 1 [IU] via SUBCUTANEOUS
  Filled 2011-08-17: qty 3

## 2011-08-17 MED ORDER — CARVEDILOL 25 MG PO TABS
25.0000 mg | ORAL_TABLET | Freq: Two times a day (BID) | ORAL | Status: DC
  Administered 2011-08-17 – 2011-08-18 (×2): 25 mg via ORAL
  Filled 2011-08-17: qty 2
  Filled 2011-08-17: qty 1
  Filled 2011-08-17: qty 2
  Filled 2011-08-17: qty 1

## 2011-08-17 MED ORDER — GLYBURIDE-METFORMIN 2.5-500 MG PO TABS: 1.0000 | ORAL_TABLET | Freq: Two times a day (BID) | ORAL | Status: DC

## 2011-08-17 MED ORDER — AMLODIPINE BESYLATE 10 MG PO TABS
10.0000 mg | ORAL_TABLET | Freq: Every day | ORAL | Status: DC
  Administered 2011-08-17 – 2011-08-18 (×2): 10 mg via ORAL
  Filled 2011-08-17 (×2): qty 2

## 2011-08-17 MED ORDER — FUROSEMIDE 10 MG/ML IJ SOLN
40.0000 mg | Freq: Two times a day (BID) | INTRAMUSCULAR | Status: DC
  Administered 2011-08-17 – 2011-08-18 (×2): 40 mg via INTRAVENOUS
  Filled 2011-08-17 (×4): qty 4

## 2011-08-17 MED ORDER — GLYBURIDE 5 MG PO TABS
2.5000 mg | ORAL_TABLET | Freq: Two times a day (BID) | ORAL | Status: DC
  Administered 2011-08-17: 2.5 mg via ORAL
  Filled 2011-08-17: qty 1

## 2011-08-17 MED ORDER — ALBUTEROL SULFATE (5 MG/ML) 0.5% IN NEBU
2.5000 mg | INHALATION_SOLUTION | Freq: Four times a day (QID) | RESPIRATORY_TRACT | Status: DC
  Administered 2011-08-17 – 2011-08-18 (×4): 2.5 mg via RESPIRATORY_TRACT
  Filled 2011-08-17 (×4): qty 0.5

## 2011-08-17 MED ORDER — ZOLPIDEM TARTRATE 5 MG PO TABS
5.0000 mg | ORAL_TABLET | Freq: Every evening | ORAL | Status: DC | PRN
  Administered 2011-08-17 – 2011-08-22 (×4): 5 mg via ORAL
  Filled 2011-08-17 (×4): qty 1

## 2011-08-17 MED ORDER — IPRATROPIUM BROMIDE 0.02 % IN SOLN
0.5000 mg | Freq: Once | RESPIRATORY_TRACT | Status: AC
  Administered 2011-08-17: 0.5 mg via RESPIRATORY_TRACT

## 2011-08-17 MED ORDER — PANTOPRAZOLE SODIUM 40 MG PO TBEC
40.0000 mg | DELAYED_RELEASE_TABLET | Freq: Every day | ORAL | Status: DC
  Administered 2011-08-17 – 2011-08-23 (×7): 40 mg via ORAL
  Filled 2011-08-17 (×7): qty 1

## 2011-08-17 MED ORDER — NITROGLYCERIN 0.4 MG SL SUBL
0.4000 mg | SUBLINGUAL_TABLET | SUBLINGUAL | Status: DC | PRN
  Administered 2011-08-18: 0.4 mg via SUBLINGUAL
  Filled 2011-08-17: qty 25

## 2011-08-17 MED ORDER — GLYBURIDE 2.5 MG PO TABS
2.5000 mg | ORAL_TABLET | Freq: Two times a day (BID) | ORAL | Status: DC
  Filled 2011-08-17: qty 1

## 2011-08-17 MED ORDER — CITALOPRAM HYDROBROMIDE 20 MG PO TABS
20.0000 mg | ORAL_TABLET | Freq: Every day | ORAL | Status: DC
  Administered 2011-08-17 – 2011-08-23 (×6): 20 mg via ORAL
  Filled 2011-08-17 (×7): qty 1

## 2011-08-17 MED ORDER — ALBUTEROL SULFATE (5 MG/ML) 0.5% IN NEBU: 5.0000 mg | INHALATION_SOLUTION | Freq: Once | RESPIRATORY_TRACT | Status: DC

## 2011-08-17 MED ORDER — BENAZEPRIL HCL 20 MG PO TABS
20.0000 mg | ORAL_TABLET | Freq: Every day | ORAL | Status: DC
  Administered 2011-08-17: 20 mg via ORAL
  Filled 2011-08-17 (×2): qty 2

## 2011-08-17 NOTE — ED Notes (Signed)
Spoke with admitting MD - orders to bring pt to floor and will see pt on floor.

## 2011-08-17 NOTE — ED Notes (Signed)
Per EMS - pt has not been feeling good x 2 wks - increased sob x 3 days with non-productive cough.  Pt speaking short phrases upon arrival with expiratory wheezing.  Alert and oriented x 4.

## 2011-08-17 NOTE — H&P (Signed)
PCP:   Sonda Primes, MD, MD   Chief Complaint:  Shortness of breath and chest pain for 3 days  HPI: This is an 77 year old female with a history of S/P MI on 05/10/2010 status post stent to circumflex right coronary artery, congestive heart failure with EF 40-45%, type 2 diabetes mellitus and hypertension, patient presented to the ER with chief complaint of shortness of breath for the last 3 days which is progressively get worse associated with orthopnea and paroxysmal nocturnal dyspnea, patient has to sleep last night on the recliner, patient denies any lower extremity swelling, also complained of chest pain mild to moderate flulike chest tightness nonradiating associated with mild diaphoresis the pain lasts a few minutes. Her condition significantly improved after receiving Lasix IV on the emergency room, also patient admitted she did see her M.D. because of runny nose, and shortness of breath last week and she received the flu vaccine, she denies any nausea but has 1 episode of vomiting early morning, denies any abdominal pain, denies any blurring of vision, denies any headache, denies any numbness or weakness , deni esburning micturition, denies any hematuria or incontinencees. On the emergency room she had a chest x-ray which suggest interstitial edema and also high proBNP.    Review of Systems:  Per history of present illness  Past Medical History: Past Medical History  Diagnosis Date  . Hypertension   . Osteoarthritis   . Osteopenia   . Diverticulosis of colon   . Diabetes mellitus     type II  . Hyperlipidemia   . CAD (coronary artery disease)   . MI (myocardial infarction) 9-22    Dr Tresa Endo  . OSA on CPAP   . Asthma   . Stroke    Past Surgical History  Procedure Date  . Abdominal hysterectomy 1980    no bso  . 2 stents 9-11    Medications: Prior to Admission medications   Medication Sig Start Date End Date Taking? Authorizing Provider  ACCU-CHEK SOFTCLIX LANCETS  lancets by Other route daily as needed. Use as instructed     Historical Provider, MD  amLODipine-benazepril (LOTREL) 10-20 MG per capsule Take 1 capsule by mouth daily. 07/01/11   Sonda Primes, MD  aspirin 81 MG EC tablet Take 81 mg by mouth daily.      Historical Provider, MD  carvedilol (COREG) 25 MG tablet Take 1 tablet (25 mg total) by mouth 2 (two) times daily with a meal. 07/01/11   Sonda Primes, MD  citalopram (CELEXA) 20 MG tablet Take 1 tablet (20 mg total) by mouth daily. 07/01/11   Sonda Primes, MD  cyanocobalamin (,VITAMIN B-12,) 1000 MCG/ML injection 1 ml sq qd x 1 wk, then 1 ml sq q 2 wks 07/24/11   Sonda Primes, MD  Cyanocobalamin 1000 MCG SUBL Place 1 tablet (1,000 mcg total) under the tongue daily. 08/05/11   Sonda Primes, MD  fluticasone (FLONASE) 50 MCG/ACT nasal spray Place 2 sprays into the nose daily. 07/01/11   Sonda Primes, MD  furosemide (LASIX) 40 MG tablet Take 1 tablet (40 mg total) by mouth daily. 07/19/11   Sonda Primes, MD  glucose blood (ACCU-CHEK AVIVA) test strip 1 each by Other route as needed. Use as instructed     Historical Provider, MD  glyBURIDE-metformin (GLUCOVANCE) 2.5-500 MG per tablet Take 1 tablet by mouth 2 (two) times daily with a meal. 07/01/11   Sonda Primes, MD  isosorbide dinitrate (ISORDIL) 30 MG tablet Take 1-2 tablets (30-60 mg  total) by mouth 4 (four) times daily as needed. 1-2 tabs qid prn for dizziness 07/01/11   Sonda Primes, MD  metFORMIN (GLUCOPHAGE) 500 MG tablet Take 1 tablet (500 mg total) by mouth 2 (two) times daily with a meal. 07/09/11 07/08/12  Sonda Primes, MD  nitroGLYCERIN (NITROSTAT) 0.4 MG SL tablet Place 1 tablet (0.4 mg total) under the tongue every 5 (five) minutes as needed. 07/01/11   Sonda Primes, MD  pantoprazole (PROTONIX) 40 MG tablet Take 1 tablet (40 mg total) by mouth daily. 07/01/11   Sonda Primes, MD  traMADol (ULTRAM) 50 MG tablet Take 1-2 tablets (50-100 mg total) by mouth 2 (two) times daily as  needed for pain. 1-2 tabs bid prn for pain 07/01/11   Sonda Primes, MD    Allergies:   Allergies  Allergen Reactions  . Clopidogrel Bisulfate Nausea And Vomiting  . Lisinopril Cough    Social History:  reports that she has never smoked. She does not have any smokeless tobacco history on file. She reports that she does not drink alcohol or use illicit drugs.  Family History: Family History  Problem Relation Age of Onset  . Hypertension Other     Physical Exam: Filed Vitals:   08/17/11 1000 08/17/11 1024 08/17/11 1100 08/17/11 1213  BP: 154/65 154/65 170/64   Pulse: 66 69 66   Temp:      TempSrc:      Resp: 14 20 16    Height:    5\' 5"  (1.651 m)  Weight:    77 kg (169 lb 12.1 oz)  SpO2: 100% 100% 95%     she is some mild shortness of breath, pupil equal and active to light and accommodation and extraocular muscle movement within normal, neck supple currently I don't see any JVD, no thyromegaly  Heart S1 and S2 with no added murmur, lung examination find rails bilateral but not very significant mainly at the bases, abdomen soft nontender bowel sounds present, extremity without edema and peripheral pulses intact, CNS she is awake alert and fully oriented with no focal neurological deficit  Labs on Admission:   [facility] 08/17/11 0856  NA 138  K 3.7  CL 105  CO2 26  GLUCOSE 175*  BUN 22  CREATININE 1.08  CALCIUM 9.1  MG --  PHOS --   No results found for this basename: AST:2,ALT:2,ALKPHOS:2,BILITOT:2,PROT:2,ALBUMIN:2 in the last 72 hours No results found for this basename: LIPASE:2,AMYLASE:2 in the last 72 hours  Basename 08/17/11 0856  WBC 7.0  NEUTROABS 5.5  HGB 10.3*  HCT 30.3*  MCV 91.8  PLT 156   No results found for this basename: CKTOTAL:3,CKMB:3,CKMBINDEX:3,TROPONINI:3 in the last 72 hours No results found for this basename: TSH,T4TOTAL,FREET3,T3FREE,THYROIDAB in the last 72 hours No results found for this basename:  VITAMINB12:2,FOLATE:2,FERRITIN:2,TIBC:2,IRON:2,RETICCTPCT:2 in the last 72 hours  EKG does show normal sinus rhythm with Q wave V and V3, T3 and some mild T-wave inversion which is new and aVL no evidence of ST MI  Radiological Exams on Admission: Dg Chest 2 View  08/17/2011  *RADIOLOGY REPORT*  Clinical Data: Shortness of breath  CHEST - 2 VIEW  Comparison: Portable chest x-ray of 07/14/2010 and CT angio chest of the same date  Findings: There are prominent interstitial markings with cardiomegaly most consistent with interstitial edema. An inflammatory process is difficult to exclude and follow up is recommended.  No definite effusion is seen.  The bones are osteopenic.  IMPRESSION: Cardiomegaly and probable interstitial edema.  Original Report  Authenticated By: Juline Patch, M.D.    Assessment/Plan 1-acute on chronic CHF with EF 40%, patient would be admitted to telemetry would increase the Lasix to 40 mg IV twice a day with potassium supplement, would continue with Coreg, and ACE inhibitor, patient noticed not on Plavix. I would continue with aspirin, will cycle cardiac enzyme, would get 2-D echo, would likely consulting cardiology on Monday, input and output, and CHF education 2-high d-dimer I would proceed with a CT angio  to exclude PE, and would get flu pannel 3-anemia monitor 5-diabetes mellitus with check hemoglobin C1, insulin sliding scale and home medication, further recommendation the hospital course progresse. 1-Mary Cook I. 08/17/2011, 1:20 PM

## 2011-08-17 NOTE — ED Provider Notes (Signed)
History   This chart was scribed for Mary Stairs, MD by [name]. The patient was seen in room APA04/APA04 and the patient's care was started at 8:12AM.    CSN: [id number]  Arrival date & time 08/17/11  0821   First MD Initiated Contact with Patient 08/17/11 [phone]      Chief Complaint  Patient presents with  . Shortness of Breath    (Consider location/radiation/quality/duration/timing/severity/associated sxs/prior treatment) HPI  Mary Cook is a 77 y.o. female who presents to the Emergency Department complaining of moderate, constant shortness of breath onset two days ago with associated symptoms of sweating, vomiting. Modifying factors include lying down which worsens the shortness of breath, breathing of oxygen which relieves the shortness of breath . Pt. Has a history of sleep apnea and MI. The last MI occurred four months ago. Pt. Does not smoke. Pt. Denies nocturia, lower extremity swelling, chest pain, any sputum production or fever. no liver and kidney problems. Pt states that she had to sleep sitting up last night.      Past Medical History  Diagnosis Date  . Hypertension   . Osteoarthritis   . Osteopenia   . Diverticulosis of colon   . Diabetes mellitus     type II  . Hyperlipidemia   . CAD (coronary artery disease)   . MI (myocardial infarction) 9-22    Dr Tresa Endo  . OSA on CPAP   . Asthma   . Stroke     Past Surgical History  Procedure Date  . Abdominal hysterectomy 1980    no bso  . 2 stents 9-11    Family History  Problem Relation Age of Onset  . Hypertension Other     History  Substance Use Topics  . Smoking status: Never Smoker   . Smokeless tobacco: Not on file  . Alcohol Use: No    OB History    Grav Para Term Preterm Abortions TAB SAB Ect Mult Living                  Review of Systems  10 Systems reviewed and are negative for acute change except as noted in the HPI.   Allergies  Clopidogrel bisulfate and  Lisinopril  Home Medications   Current Outpatient Rx  Name Route Sig Dispense Refill  . ACCU-CHEK SOFTCLIX LANCETS MISC Other by Other route daily as needed. Use as instructed     . AMLODIPINE BESY-BENAZEPRIL HCL 10-20 MG PO CAPS Oral Take 1 capsule by mouth daily. 90 capsule 0  . ASPIRIN 81 MG PO TBEC Oral Take 81 mg by mouth daily.      Marland Kitchen CARVEDILOL 25 MG PO TABS Oral Take 1 tablet (25 mg total) by mouth 2 (two) times daily with a meal. 180 tablet 0  . CITALOPRAM HYDROBROMIDE 20 MG PO TABS Oral Take 1 tablet (20 mg total) by mouth daily. 90 tablet 0  . CYANOCOBALAMIN 1000 MCG/ML IJ SOLN  1 ml sq qd x 1 wk, then 1 ml sq q 2 wks 10 mL 6  . CYANOCOBALAMIN 1000 MCG SL SUBL Sublingual Place 1 tablet (1,000 mcg total) under the tongue daily. 100 tablet 3  . FLUTICASONE PROPIONATE 50 MCG/ACT NA SUSP Nasal Place 2 sprays into the nose daily. 48 g 3  . FUROSEMIDE 40 MG PO TABS Oral Take 1 tablet (40 mg total) by mouth daily. 90 tablet 3  . GLUCOSE BLOOD VI STRP Other 1 each by Other route as needed. Use  as instructed     . GLYBURIDE-METFORMIN 2.5-500 MG PO TABS Oral Take 1 tablet by mouth 2 (two) times daily with a meal. 180 tablet 3  . ISOSORBIDE DINITRATE 30 MG PO TABS Oral Take 1-2 tablets (30-60 mg total) by mouth 4 (four) times daily as needed. 1-2 tabs qid prn for dizziness 180 tablet 3  . METFORMIN HCL 500 MG PO TABS Oral Take 1 tablet (500 mg total) by mouth 2 (two) times daily with a meal. 60 tablet 0  . NITROGLYCERIN 0.4 MG SL SUBL Sublingual Place 1 tablet (0.4 mg total) under the tongue every 5 (five) minutes as needed. 90 tablet 3  . PANTOPRAZOLE SODIUM 40 MG PO TBEC Oral Take 1 tablet (40 mg total) by mouth daily. 90 tablet 3  . TRAMADOL HCL 50 MG PO TABS Oral Take 1-2 tablets (50-100 mg total) by mouth 2 (two) times daily as needed for pain. 1-2 tabs bid prn for pain 60 tablet 3    Temp(Src) 97.8 F (36.6 C) (Axillary)  Resp 28  Ht 5\' 5"  (1.651 m)  Wt 170 lb (77.111 kg)  BMI  28.29 kg/m2  SpO2 97%  Physical Exam  Nursing note and vitals reviewed. Constitutional: She is oriented to person, place, and time. She appears well-developed and well-nourished. No distress.  HENT:  Head: Normocephalic and atraumatic.  Eyes: Conjunctivae and EOM are normal. Pupils are equal, round, and reactive to light.  Neck: Normal range of motion. Neck supple. No tracheal deviation present.  Cardiovascular: Normal rate and regular rhythm.   No murmur heard. Pulmonary/Chest: Effort normal. No respiratory distress. She has rales (Bilateral, bases, posteriorly. ).  Abdominal: Soft. She exhibits no distension.  Musculoskeletal: Normal range of motion. She exhibits no edema.  Neurological: She is alert and oriented to person, place, and time. No sensory deficit.  Skin: Skin is warm and dry.  Psychiatric: She has a normal mood and affect. Her behavior is normal.    ED Course  Procedures (including critical care time) 77 year old, female, with a history of myocardial infarction, presents with progressive shortness of breath, nonproductive cough, and orthopnea.  She's got rales posteriorly in the bases.  She has not smoked ever and she denies history of COPD.  She uses oxygen at night due to sleep apnea, however, not routinely.  She has never had CHF in the past.  We will perform a chest x-ray, and laboratory testing for further evaluation.  I suspect that she is CHF.  Pneumonia, COPD, or other possibilities, but less likely.  DIAGNOSTIC STUDIES: Oxygen Saturation is 97% on room air, normal by my interpretation.    COORDINATION OF CARE:    Results for orders placed during the hospital encounter of 08/17/11  CBC      Component Value Range   WBC 7.0  4.0 - 10.5 (K/uL)   RBC 3.30 (*) 3.87 - 5.11 (MIL/uL)   Hemoglobin 10.3 (*) 12.0 - 15.0 (g/dL)   HCT 69.6 (*) 29.5 - 46.0 (%)   MCV 91.8  78.0 - 100.0 (fL)   MCH 31.2  26.0 - 34.0 (pg)   MCHC 34.0  30.0 - 36.0 (g/dL)   RDW 28.4  13.2  - 44.0 (%)   Platelets 156  150 - 400 (K/uL)  DIFFERENTIAL      Component Value Range   Neutrophils Relative 78 (*) 43 - 77 (%)   Neutro Abs 5.5  1.7 - 7.7 (K/uL)   Lymphocytes Relative 14  12 - 46 (%)  Lymphs Abs 1.0  0.7 - 4.0 (K/uL)   Monocytes Relative 6  3 - 12 (%)   Monocytes Absolute 0.4  0.1 - 1.0 (K/uL)   Eosinophils Relative 2  0 - 5 (%)   Eosinophils Absolute 0.1  0.0 - 0.7 (K/uL)   Basophils Relative 0  0 - 1 (%)   Basophils Absolute 0.0  0.0 - 0.1 (K/uL)  BASIC METABOLIC PANEL      Component Value Range   Sodium 138  135 - 145 (mEq/L)   Potassium 3.7  3.5 - 5.1 (mEq/L)   Chloride 105  96 - 112 (mEq/L)   CO2 26  19 - 32 (mEq/L)   Glucose, Bld 175 (*) 70 - 99 (mg/dL)   BUN 22  6 - 23 (mg/dL)   Creatinine, Ser 4.09  0.50 - 1.10 (mg/dL)   Calcium 9.1  8.4 - 81.1 (mg/dL)   GFR calc non Af Amer 46 (*) >90 (mL/min)   GFR calc Af Amer 53 (*) >90 (mL/min)  PRO B NATRIURETIC PEPTIDE      Component Value Range   Pro B Natriuretic peptide (BNP) 2431.0 (*) 0 - 450 (pg/mL)  POCT I-STAT TROPONIN I      Component Value Range   Troponin i, poc 0.03  0.00 - 0.08 (ng/mL)   Comment 3            Dg Chest 2 View  08/17/2011  *RADIOLOGY REPORT*  Clinical Data: Shortness of breath  CHEST - 2 VIEW  Comparison: Portable chest x-ray of 07/14/2010 and CT angio chest of the same date  Findings: There are prominent interstitial markings with cardiomegaly most consistent with interstitial edema. An inflammatory process is difficult to exclude and follow up is recommended.  No definite effusion is seen.  The bones are osteopenic.  IMPRESSION: Cardiomegaly and probable interstitial edema.  Original Report Authenticated By: Juline Patch, M.D.      ED ECG REPORT   Date: 08/17/2011  EKG Time: 10:27 AM  Rate: 74  Rhythm: normal sinus rhythm,   Axis: nl  Intervals:none  ST&T Change: nonspecific tw changes   Narrative Interpretation: Normal sinus rhythm with Q waves inferiorly and poor  R-wave progression          11:04 AM Discussed with [facility]. She will admit Pt desaturated on RA after she walked  To bathroom to urinate.       MDM  Congestive heart failure  8:41AM- EDP at bedside discuss treatment plan.  10:32AM- Recheck. EDP at bedside discusses treatment plan. Says she feels 100% better.  sats 98% on ra. Wants to go to bathroom since lasix given.  10:53AM- Recheck. EDP at bedside discusses treatment plan.  I personally performed the services described in this documentation, which was scribed in my presence. The recorded information has been reviewed and considered.         Mary Stairs, MD 08/17/11 1104

## 2011-08-17 NOTE — ED Notes (Signed)
Assisted patient with bedside commode tolerated well. Still felt short of breath. Output was 700cc.

## 2011-08-17 NOTE — ED Notes (Signed)
Family at bedside. 

## 2011-08-17 NOTE — ED Notes (Addendum)
Pt up to bathroom without O2 - dyspnea with exertion, pt sob and mild expiratory wheezing upon  returning from restroom.  Placed pt back on O2 at 2L/min.  Sats remained WDL while ambulating and after returning from restroom.  edp notified.

## 2011-08-18 ENCOUNTER — Other Ambulatory Visit: Payer: Self-pay

## 2011-08-18 ENCOUNTER — Encounter (HOSPITAL_COMMUNITY)
Admission: EM | Disposition: A | Discharge status: Home / Self Care | Payer: Self-pay | Source: Home / Self Care | Attending: Internal Medicine

## 2011-08-18 ENCOUNTER — Encounter: Payer: Self-pay | Admitting: Cardiovascular Disease

## 2011-08-18 ENCOUNTER — Inpatient Hospital Stay (HOSPITAL_COMMUNITY): Payer: Medicare PPO

## 2011-08-18 DIAGNOSIS — I5043 Acute on chronic combined systolic (congestive) and diastolic (congestive) heart failure: Principal | ICD-10-CM | POA: Diagnosis present

## 2011-08-18 DIAGNOSIS — I2510 Atherosclerotic heart disease of native coronary artery without angina pectoris: Secondary | ICD-10-CM

## 2011-08-18 DIAGNOSIS — R570 Cardiogenic shock: Secondary | ICD-10-CM | POA: Diagnosis not present

## 2011-08-18 DIAGNOSIS — G4730 Sleep apnea, unspecified: Secondary | ICD-10-CM | POA: Diagnosis present

## 2011-08-18 HISTORY — PX: LEFT HEART CATHETERIZATION WITH CORONARY ANGIOGRAM: SHX5451

## 2011-08-18 LAB — MRSA PCR SCREENING: MRSA by PCR: NEGATIVE

## 2011-08-18 LAB — GLUCOSE, CAPILLARY: Glucose-Capillary: 128 mg/dL — ABNORMAL HIGH (ref 70–99)

## 2011-08-18 LAB — CBC
HCT: 29.2 % — ABNORMAL LOW (ref 36.0–46.0)
MCHC: 33.9 g/dL (ref 30.0–36.0)
MCV: 91.3 fL (ref 78.0–100.0)
Platelets: 174 10*3/uL (ref 150–400)
RDW: 12.7 % (ref 11.5–15.5)

## 2011-08-18 LAB — COMPREHENSIVE METABOLIC PANEL
ALT: 45 U/L — ABNORMAL HIGH (ref 0–35)
Albumin: 3.1 g/dL — ABNORMAL LOW (ref 3.5–5.2)
Alkaline Phosphatase: 71 U/L (ref 39–117)
Chloride: 97 mEq/L (ref 96–112)
GFR calc Af Amer: 48 mL/min — ABNORMAL LOW (ref >90)
Glucose, Bld: 90 mg/dL (ref 70–99)
Total Bilirubin: 0.5 mg/dL (ref 0.3–1.2)
Total Protein: 6.3 g/dL (ref 6.0–8.3)

## 2011-08-18 LAB — CARDIAC PANEL(CRET KIN+CKTOT+MB+TROPI): Relative Index: 2.4 (ref 0.0–2.5)

## 2011-08-18 SURGERY — LEFT HEART CATHETERIZATION WITH CORONARY ANGIOGRAM
Anesthesia: LOCAL

## 2011-08-18 SURGERY — CORONARY ANGIOGRAM
Anesthesia: Choice | Laterality: Right

## 2011-08-18 MED ORDER — ALPRAZOLAM 0.25 MG PO TABS
0.2500 mg | ORAL_TABLET | Freq: Two times a day (BID) | ORAL | Status: DC | PRN
  Administered 2011-08-18 – 2011-08-21 (×2): 0.25 mg via ORAL
  Filled 2011-08-18 (×2): qty 1

## 2011-08-18 MED ORDER — SODIUM CHLORIDE 0.9 % IJ SOLN: 3.0000 mL | INTRAMUSCULAR | Status: DC | PRN

## 2011-08-18 MED ORDER — ACETAMINOPHEN 325 MG PO TABS: 650.0000 mg | ORAL_TABLET | ORAL | Status: DC | PRN

## 2011-08-18 MED ORDER — DIAZEPAM 5 MG PO TABS: 5.0000 mg | ORAL_TABLET | ORAL | Status: DC

## 2011-08-18 MED ORDER — NITROGLYCERIN IN D5W 200-5 MCG/ML-% IV SOLN
3.0000 ug/min | INTRAVENOUS | Status: DC
  Administered 2011-08-18: 10 ug/min via INTRAVENOUS
  Filled 2011-08-18: qty 250

## 2011-08-18 MED ORDER — ROSUVASTATIN CALCIUM 20 MG PO TABS
20.0000 mg | ORAL_TABLET | Freq: Every day | ORAL | Status: DC
  Filled 2011-08-18: qty 1

## 2011-08-18 MED ORDER — HEPARIN (PORCINE) IN NACL 2-0.9 UNIT/ML-% IJ SOLN
INTRAMUSCULAR | Status: AC
  Filled 2011-08-18: qty 2000

## 2011-08-18 MED ORDER — INSULIN ASPART 100 UNIT/ML ~~LOC~~ SOLN
0.0000 [IU] | Freq: Every day | SUBCUTANEOUS | Status: DC
  Filled 2011-08-18: qty 3

## 2011-08-18 MED ORDER — HEPARIN BOLUS VIA INFUSION
4000.0000 [IU] | Freq: Once | INTRAVENOUS | Status: AC
  Administered 2011-08-18: 4000 [IU] via INTRAVENOUS
  Filled 2011-08-18: qty 4000

## 2011-08-18 MED ORDER — ROSUVASTATIN CALCIUM 40 MG PO TABS
40.0000 mg | ORAL_TABLET | Freq: Every day | ORAL | Status: DC
  Administered 2011-08-19 – 2011-08-23 (×5): 40 mg via ORAL
  Filled 2011-08-18 (×5): qty 1

## 2011-08-18 MED ORDER — TICAGRELOR 90 MG PO TABS
180.0000 mg | ORAL_TABLET | Freq: Once | ORAL | Status: AC
  Administered 2011-08-18: 180 mg via ORAL

## 2011-08-18 MED ORDER — MIDAZOLAM HCL 2 MG/2ML IJ SOLN
INTRAMUSCULAR | Status: AC
  Filled 2011-08-18: qty 2

## 2011-08-18 MED ORDER — SODIUM CHLORIDE 0.9 % IV SOLN: INTRAVENOUS | Status: DC

## 2011-08-18 MED ORDER — LIDOCAINE HCL (PF) 1 % IJ SOLN
INTRAMUSCULAR | Status: AC
  Filled 2011-08-18: qty 30

## 2011-08-18 MED ORDER — ROSUVASTATIN CALCIUM 40 MG PO TABS
40.0000 mg | ORAL_TABLET | ORAL | Status: DC
  Filled 2011-08-18: qty 1

## 2011-08-18 MED ORDER — TICAGRELOR 90 MG PO TABS: 90.0000 mg | ORAL_TABLET | Freq: Two times a day (BID) | ORAL | Status: DC

## 2011-08-18 MED ORDER — NITROGLYCERIN 0.2 MG/ML ON CALL CATH LAB
INTRAVENOUS | Status: AC
  Filled 2011-08-18: qty 1

## 2011-08-18 MED ORDER — ROSUVASTATIN CALCIUM 40 MG PO TABS
40.0000 mg | ORAL_TABLET | Freq: Every day | ORAL | Status: DC
  Filled 2011-08-18: qty 1

## 2011-08-18 MED ORDER — MAGNESIUM HYDROXIDE 400 MG/5ML PO SUSP
30.0000 mL | Freq: Every evening | ORAL | Status: DC | PRN
  Administered 2011-08-18 – 2011-08-20 (×2): 30 mL via ORAL
  Filled 2011-08-18 (×2): qty 30

## 2011-08-18 MED ORDER — SODIUM CHLORIDE 0.9 % IV SOLN
INTRAVENOUS | Status: AC
  Administered 2011-08-18: 17:00:00 via INTRAVENOUS

## 2011-08-18 MED ORDER — HEPARIN SOD (PORCINE) IN D5W 100 UNIT/ML IV SOLN
1050.0000 [IU]/h | INTRAVENOUS | Status: DC
  Administered 2011-08-18: 1050 [IU]/h via INTRAVENOUS
  Filled 2011-08-18: qty 250

## 2011-08-18 MED ORDER — ONDANSETRON HCL 4 MG/2ML IJ SOLN: 4.0000 mg | Freq: Four times a day (QID) | INTRAMUSCULAR | Status: DC | PRN

## 2011-08-18 MED ORDER — INSULIN ASPART 100 UNIT/ML ~~LOC~~ SOLN: 0.0000 [IU] | Freq: Three times a day (TID) | SUBCUTANEOUS | Status: DC

## 2011-08-18 MED ORDER — SODIUM CHLORIDE 0.9 % IV BOLUS (SEPSIS)
250.0000 mL | Freq: Once | INTRAVENOUS | Status: AC
  Administered 2011-08-18: 250 mL via INTRAVENOUS

## 2011-08-18 MED ORDER — POTASSIUM CHLORIDE CRYS ER 20 MEQ PO TBCR: 30.0000 meq | EXTENDED_RELEASE_TABLET | Freq: Once | ORAL | Status: DC

## 2011-08-18 MED ORDER — FENTANYL CITRATE 0.05 MG/ML IJ SOLN
INTRAMUSCULAR | Status: AC
  Filled 2011-08-18: qty 2

## 2011-08-18 MED ORDER — SODIUM CHLORIDE 0.9 % IV BOLUS (SEPSIS)
500.0000 mL | Freq: Once | INTRAVENOUS | Status: AC
  Administered 2011-08-18: 500 mL via INTRAVENOUS

## 2011-08-18 NOTE — Progress Notes (Signed)
ANTICOAGULATION CONSULT NOTE - Initial Consult  Pharmacy Consult for Heparin Indication: chest pain/ACS/Stemi  Allergies  Allergen Reactions  . Clopidogrel Bisulfate Nausea And Vomiting  . Lisinopril Cough    Patient Measurements: Height: 5\' 5"  (165.1 cm) Weight: 175 lb 12.8 oz (79.742 kg) IBW/kg (Calculated) : 57  Adjusted Body Weight: 74 kg  Vital Signs: Temp: 97.8 F (36.6 C) (12/23 1132) Temp src: Oral (12/23 0548) BP: 96/54 mmHg (12/23 1132) Pulse Rate: 61  (12/23 1132)  Labs:  Basename 08/18/11 0642 08/17/11 2019 08/17/11 0856  HGB 9.9* -- 10.3*  HCT 29.2* -- 30.3*  PLT 174 -- 156  APTT -- -- --  LABPROT -- -- --  INR -- -- --  HEPARINUNFRC -- -- --  CREATININE 1.18* -- 1.08  CKTOTAL 115 94 --  CKMB 2.8 3.6 --  TROPONINI <0.30 0.30* --   Estimated Creatinine Clearance: 37.7 ml/min (by C-G formula based on Cr of 1.18).  Medical History: Past Medical History  Diagnosis Date  . Hypertension   . Osteoarthritis   . Osteopenia   . Diverticulosis of colon   . Diabetes mellitus     type II  . Hyperlipidemia   . CAD (coronary artery disease)   . MI (myocardial infarction) 9-22    Dr Tresa Endo  . OSA on CPAP   . Asthma   . Stroke     Medications:  Scheduled:    . albuterol  2.5 mg Nebulization Q6H  . amLODipine  10 mg Oral Daily   And  . benazepril  20 mg Oral Daily  . aspirin EC  81 mg Oral Daily  . carvedilol  25 mg Oral BID WC  . citalopram  20 mg Oral Daily  . fluticasone  2 spray Each Nare Daily  . furosemide  40 mg Intravenous Q12H  . glyBURIDE  2.5 mg Oral BID WC  . heparin  5,000 Units Subcutaneous Q8H  . insulin aspart  0-9 Units Subcutaneous TID WC  . isosorbide dinitrate  30 mg Oral TID  . pantoprazole  40 mg Oral Daily  . potassium chloride  10 mEq Oral BID  . potassium chloride  30 mEq Oral Once  . DISCONTD: amLODipine-benazepril  1 capsule Oral Daily  . DISCONTD: glyBURIDE  2.5 mg Oral BID WC  . DISCONTD: glyBURIDE  2.5 mg Oral BID  WC  . DISCONTD: glyBURIDE-metformin  1 tablet Oral BID WC  . DISCONTD: metFORMIN  500 mg Oral BID WC  . DISCONTD: metFORMIN  500 mg Oral BID WC  . DISCONTD: metFORMIN  500 mg Oral BID WC    Assessment: Okay for Protocol No baseline coag labs currently  Goal of Therapy:  Heparin level 0.3-0.7 units/ml   Plan:  4000 units bolus, Heparin drip at 1050 units/hr. Heparin Level, PT/INR, and CBC in 6 hours. Daily Heparin Level and CBC while on heparin.  Lamonte Richer R 08/18/2011,11:36 AM

## 2011-08-18 NOTE — Progress Notes (Signed)
CRITICAL VALUE ALERT  Critical value received: troponin  Date of notification: 08/17/11  Time of notification:  2156  Critical value read back:yes  Nurse who received alert:  Gailen Shelter  MD notified (1st page):  Rito Ehrlich  Time of first page:  2200  MD notified (2nd page):  Time of second page:  Responding MD:  Rito Ehrlich  Time MD responded:  2205

## 2011-08-18 NOTE — Progress Notes (Signed)
Patient starting complaining of feeling "funny" She stated that her heart was fluttering and that she felt bad. RN got an EKG done and a blood sugar check. BS was 65. Hypoglycemia protocol was done and blood sugar was 115 after 15 minutes. Doctor was notified. New orders were given for a cardiac panel.

## 2011-08-18 NOTE — H&P (Signed)
Pt. Seen and examined. Agree with the NP/PA-C note as written. Called by [facility] hospitalists at [facility] around 11:30 am today to consult on a patient of ours. She presented yesterday with severe dyspnea and CHF symptoms and felt like "she was going to die".  There was t-wave flattening, but no ST elevation at that time. She was diuresed and symptoms improved. Today she reported dull chest pain, increasing in severity, diaphoresis and some more dyspnea. I reviewed an EKG performed around 5:30 am this morning which shows marked anterolateral deep T-wave inversions, concerning for high grade proximal LAD or LM disease versus possible cerebral process. There are no new reported focal neurologic findings, however, she does report headache, albeit after nitroglycerin.  I was told her systolic BP was 80 and there was concern for cardiogenic shock, however, I cannot find documentation for this. I instructed starting a heparin gtts, aspirin, and loading her with 180 mg brillinta. Nitroglycerin was not given due to reported cardiogenic shock.  I'm concerned with STEMI-equivalent high risk CAD and recommended emergent transfer to [facility] for cardiac catheterization. Unfortunately, another STEMI arrived at the same time.  After examining her, she is not hypotensive, pain is 5/10, dull, substernal. She is diaphoretic.  Will start  Nitroglycerin gtts .Marland Kitchen Plan for [facility] after the current STEMI case is complete.  Chrystie Nose, MD Attending Cardiologist [facility]

## 2011-08-18 NOTE — Progress Notes (Signed)
Patient refuses 2 medications. Lotensin 20mg  and Celexa 20mg . Will continue to monitor.

## 2011-08-18 NOTE — Progress Notes (Signed)
Patient arrived to room via stretcher from ED. Physician also present to assess patient. Patient was Code STEMI but still waiting to go to cath lab. Patient reports some chest pressure and nitroglycerin drip started per Dr. Rennis Golden. Patient currently alert, oriented and not in distress. MD present and talking with patient about procedure to be done. Bryna Colander

## 2011-08-18 NOTE — Interval H&P Note (Signed)
History and Physical Interval Note:  08/18/2011 3:26 PM  Mary Cook  has presented today for surgery, with the diagnosis of Unstable angina with ongoing chest pain and significant TW changes in anterior leads.  The various methods of treatment have been discussed with the patient. After consideration of risks, benefits and other options for treatment, the patient has consented to  Procedure(s): Urgent LEFT HEART CATHETERIZATION WITH CORONARY ANGIOGRAM as a surgical intervention .  The patients' history has been reviewed, patient examined, no change in status, stable for surgery.  I have reviewed the patients' chart and labs.  Questions were answered to the patient's satisfaction.     Lorine Bears

## 2011-08-18 NOTE — Progress Notes (Signed)
Attempted to call report to [facility] [address], they will return my call.

## 2011-08-18 NOTE — H&P (Signed)
Patient ID: Mary Cook MRN: [id number], DOB/AGE: 01/20/1926   Admit date: 08/17/2011   Primary Physician: Sonda Primes, MD, MD Primary Cardiologist: Dr Tresa Endo  HPI:  This is Mary Cook iron is a 77 year old female followed by Dr. Tresa Endo.  She is a history of coronary disease. She had a large anterior wall MI in September 2011. This was treated with a circumflex DES stent. She has an ejection fraction of 45%. She took Effient for one year. She presented to [facility] on December 22 complaining of dyspnea and chest pain for 3 days. She was initially treated with Lasix and sent home after she improved symptomatically. She came back to the emergency room with recurrent chest pain and shortness of breath. She was admitted to [facility]. Her initial EKG showed nonspecific changes. Her troponins were negative x2. Subsequent EKG today showed significant T wave inversion in anteroseptal and lateral leads. She became hypotensive. She was transferred to [facility] for urgent evaluation. Initially the plan was to take her directly to the cath lab but there was a another STEMI in the lab. Currently she is relatively comfortable laying flat in bed. Her pressure is now stabilized and will resume nitroglycerin.   Problem List: Past Medical History  Diagnosis Date  . Hypertension   . Osteoarthritis   . Osteopenia   . Diverticulosis of colon   . Diabetes mellitus     type II  . Hyperlipidemia   . CAD (coronary artery disease)   . MI (myocardial infarction) 05-18-11    Dr Tresa Endo, Rx'd with CFX  DES  . OSA on CPAP   . Asthma   . Stroke     Past Surgical History  Procedure Date  . Abdominal hysterectomy 1980    no bso  . 2 stents 9-11     Allergies:  Allergies  Allergen Reactions  . Clopidogrel Bisulfate Nausea And Vomiting  . Lisinopril Cough     Home Medications Prescriptions prior to admission  Medication Sig Dispense Refill  . aspirin 81 MG EC tablet Take 81 mg by mouth daily.        .  carvedilol (COREG) 25 MG tablet Take 25 mg by mouth every morning.        . Cyanocobalamin 3000 MCG SUBL Place 1 tablet under the tongue daily.        . fluticasone (FLONASE) 50 MCG/ACT nasal spray Place 2 sprays into the nose daily.  48 g  3  . furosemide (LASIX) 40 MG tablet Take 1 tablet (40 mg total) by mouth daily.  90 tablet  3  . metFORMIN (GLUCOPHAGE) 500 MG tablet Take 1 tablet (500 mg total) by mouth 2 (two) times daily with a meal.  60 tablet  0  . pantoprazole (PROTONIX) 40 MG tablet Take 1 tablet (40 mg total) by mouth daily.  90 tablet  3  . ramipril (ALTACE) 10 MG capsule Take 1 tablet by mouth Daily.      . traMADol (ULTRAM) 50 MG tablet Take 1-2 tablets (50-100 mg total) by mouth 2 (two) times daily as needed for pain. 1-2 tabs bid prn for pain  60 tablet  3  . ACCU-CHEK SOFTCLIX LANCETS lancets by Other route daily as needed. Use as instructed      . citalopram (CELEXA) 20 MG tablet Take 1 tablet (20 mg total) by mouth daily.  90 tablet  0  . glucose blood (ACCU-CHEK AVIVA) test strip 1 each by Other route as needed. Use as instructed       .  nitroGLYCERIN (NITROSTAT) 0.4 MG SL tablet Place 1 tablet (0.4 mg total) under the tongue every 5 (five) minutes as needed.  90 tablet  3     Family History  Problem Relation Age of Onset  . Hypertension Other      History   Social History  . Marital Status: Married    Spouse Name: N/A    Number of Children: N/A  . Years of Education: N/A   Occupational History  . Not on file.   Social History Main Topics  . Smoking status: Never Smoker   . Smokeless tobacco: Not on file  . Alcohol Use: No  . Drug Use: No  . Sexually Active:    Other Topics Concern  . Not on file   Social History Narrative  . No narrative on file     Review of Systems: General: negative for chills, fever, night sweats or weight changes.  Cardiovascular: negative for chest pain, dyspnea on exertion, edema, orthopnea, palpitations, paroxysmal  nocturnal dyspnea or shortness of breath Dermatological: negative for rash Respiratory: negative for cough or wheezing Urologic: negative for hematuria Abdominal: negative for nausea, vomiting, diarrhea, bright red blood per rectum, melena, or hematemesis Neurologic: negative for visual changes, syncope, or dizziness All other systems reviewed and are otherwise negative except as noted above.  Physical Exam: Blood pressure 129/69, pulse 60, temperature 97.8 F (36.6 C), temperature source Oral, resp. rate 20, height 5\' 5"  (1.651 m), weight 79.742 kg (175 lb 12.8 oz), SpO2 99.00%.  General appearance: alert, cooperative and no distress Neck: no adenopathy, no carotid bruit, no JVD, supple, symmetrical, trachea midline and thyroid not enlarged, symmetric, no tenderness/mass/nodules Lungs: few crackles Lt base Heart: regular rate and rhythm, S1, S2 normal, no murmur, click, rub or gallop Abdomen: soft, non-tender; bowel sounds normal; no masses,  no organomegaly Extremities: no edema Pulses: 2+ and symmetric Skin: Skin color, texture, turgor normal. No rashes or lesions or cool clammy Neurologic: Grossly normal    Labs:   Results for orders placed during the hospital encounter of 08/17/11 (from the past 24 hour(s))  INFLUENZA PANEL BY PCR     Status: Normal   Collection Time   08/17/11  2:20 PM      Component Value Range   Influenza A By PCR NEGATIVE  NEGATIVE    Influenza B By PCR NEGATIVE  NEGATIVE    H1N1 flu by pcr NOT DETECTED  NOT DETECTED   HEMOGLOBIN A1C     Status: Abnormal   Collection Time   08/17/11  3:55 PM      Component Value Range   Hemoglobin A1C 6.5 (*) <5.7 (%)   Mean Plasma Glucose 140 (*) <117 (mg/dL)  GLUCOSE, CAPILLARY     Status: Abnormal   Collection Time   08/17/11  7:38 PM      Component Value Range   Glucose-Capillary 65 (*) 70 - 99 (mg/dL)   Comment 1 Notify RN     Comment 2 Documented in Chart    GLUCOSE, CAPILLARY     Status: Abnormal    Collection Time   08/17/11  8:04 PM      Component Value Range   Glucose-Capillary 115 (*) 70 - 99 (mg/dL)   Comment 1 Notify RN     Comment 2 Documented in Chart    CARDIAC PANEL(CRET KIN+CKTOT+MB+TROPI)     Status: Abnormal   Collection Time   08/17/11  8:19 PM      Component Value Range  Total CK 94  7 - 177 (U/L)   CK, MB 3.6  0.3 - 4.0 (ng/mL)   Troponin I 0.30 (*) <0.30 (ng/mL)   Relative Index RELATIVE INDEX IS INVALID  0.0 - 2.5   MAGNESIUM     Status: Normal   Collection Time   08/17/11  8:19 PM      Component Value Range   Magnesium 1.7  1.5 - 2.5 (mg/dL)  COMPREHENSIVE METABOLIC PANEL     Status: Abnormal   Collection Time   08/18/11  6:42 AM      Component Value Range   Sodium 134 (*) 135 - 145 (mEq/L)   Potassium 3.0 (*) 3.5 - 5.1 (mEq/L)   Chloride 97  96 - 112 (mEq/L)   CO2 29  19 - 32 (mEq/L)   Glucose, Bld 90  70 - 99 (mg/dL)   BUN 22  6 - 23 (mg/dL)   Creatinine, Ser 6.21 (*) 0.50 - 1.10 (mg/dL)   Calcium 9.0  8.4 - 30.8 (mg/dL)   Total Protein 6.3  6.0 - 8.3 (g/dL)   Albumin 3.1 (*) 3.5 - 5.2 (g/dL)   AST 27  0 - 37 (U/L)   ALT 45 (*) 0 - 35 (U/L)   Alkaline Phosphatase 71  39 - 117 (U/L)   Total Bilirubin 0.5  0.3 - 1.2 (mg/dL)   GFR calc non Af Amer 41 (*) >90 (mL/min)   GFR calc Af Amer 48 (*) >90 (mL/min)  CBC     Status: Abnormal   Collection Time   08/18/11  6:42 AM      Component Value Range   WBC 7.1  4.0 - 10.5 (K/uL)   RBC 3.20 (*) 3.87 - 5.11 (MIL/uL)   Hemoglobin 9.9 (*) 12.0 - 15.0 (g/dL)   HCT 65.7 (*) 84.6 - 46.0 (%)   MCV 91.3  78.0 - 100.0 (fL)   MCH 30.9  26.0 - 34.0 (pg)   MCHC 33.9  30.0 - 36.0 (g/dL)   RDW 96.2  95.2 - 84.1 (%)   Platelets 174  150 - 400 (K/uL)  CARDIAC PANEL(CRET KIN+CKTOT+MB+TROPI)     Status: Normal   Collection Time   08/18/11  6:42 AM      Component Value Range   Total CK 115  7 - 177 (U/L)   CK, MB 2.8  0.3 - 4.0 (ng/mL)   Troponin I <0.30  <0.30 (ng/mL)   Relative Index 2.4  0.0 - 2.5     GLUCOSE, CAPILLARY     Status: Abnormal   Collection Time   08/18/11 11:31 AM      Component Value Range   Glucose-Capillary 142 (*) 70 - 99 (mg/dL)   Comment 1 Notify RN    CARDIAC PANEL(CRET KIN+CKTOT+MB+TROPI)     Status: Normal   Collection Time   08/18/11 11:45 AM      Component Value Range   Total CK 103  7 - 177 (U/L)   CK, MB 2.5  0.3 - 4.0 (ng/mL)   Troponin I <0.30  <0.30 (ng/mL)   Relative Index 2.4  0.0 - 2.5      Radiology/Studies: Dg Chest 2 View  08/17/2011  *RADIOLOGY REPORT*  Clinical Data: Shortness of breath  CHEST - 2 VIEW  Comparison: Portable chest x-ray of 07/14/2010 and CT angio chest of the same date  Findings: There are prominent interstitial markings with cardiomegaly most consistent with interstitial edema. An inflammatory process is difficult to exclude and follow  up is recommended.  No definite effusion is seen.  The bones are osteopenic.  IMPRESSION: Cardiomegaly and probable interstitial edema.  Original Report Authenticated By: Juline Patch, M.D.   Ct Angio Chest W/cm &/or Wo Cm  08/17/2011  *RADIOLOGY REPORT*  Clinical Data:  Shortness of breath, chest pain, elevated D-dimer.  CT ANGIOGRAPHY CHEST WITH CONTRAST  Technique:  Multidetector CT imaging of the chest was performed using the standard protocol during bolus administration of intravenous contrast.  Multiplanar CT image reconstructions including MIPs were obtained to evaluate the vascular anatomy.  Contrast: OMNIPAQUE IOHEXOL 350 MG/ML IV SOLN chest x-ray 08/17/2011.  Comparison:   Chest CT 07/14/2010  Findings:  There are moderate bilateral pleural effusions. Cardiomegaly.  Coronary artery and aortic calcifications.  No aortic aneurysm or dissection.  No filling defects in the pulmonary arteries to suggest pulmonary emboli.  There is mild vascular congestion.  Ground-glass opacities throughout the lungs could reflect early edema.  There is compressive atelectasis in the lower lobes bilaterally.  No mediastinal, hilar, or axillary adenopathy. Visualized thyroid and chest wall soft tissues unremarkable. Imaging into the upper abdomen shows no acute findings.  Review of the MIP images confirms the above findings.  IMPRESSION: No evidence of pulmonary embolus.  Cardiomegaly.  Suspect mild interstitial edema.  Moderate bilateral effusions with compressive atelectasis in the lower lobes.  Coronary artery disease.  Original Report Authenticated By: Cyndie Chime, M.D.    EKG:NSR, deep TWI V2-V6 AVL  ASSESSMENT AND PLAN:  Principal Problem:  *Acute anterior wall MI  Active Problems:  Acute systolic congestive heart failure  Cardiogenic shock, resolved  CAD (coronary artery disease), Ant MI, CFX DES 9/11  DIABETES MELLITUS, TYPE II  HYPERLIPIDEMIA  HYPERTENSION  Sleep apnea, on C-Pap  VITAMIN [name] DEFICIENCY    Signed, Abelino Derrick, PA-C 08/18/2011, 1:59 PM

## 2011-08-18 NOTE — Progress Notes (Addendum)
Subjective: Hot breathing improved, she has mild chest pain felt like indigestion she does not feel this pain is similar to her pain when she has an MI, she denies any sore throat, she is making good urine, patient noticed an elevated troponin, will cycle and if trending up we'll likely contacting [facility] M[facility]  Objective: Vital signs in last 24 hours: Filed Vitals:   08/17/11 2111 08/17/11 2136 08/18/11 0548 08/18/11 0751  BP: 110/54  119/62   Pulse: 64  65   Temp: 97.9 F (36.6 C)  98.2 F (36.8 C)   TempSrc: Oral  Oral   Resp: 20  16   Height:      Weight:   79.742 kg (175 lb 12.8 oz)   SpO2: 96% 96% 94% 98%   Weight change:   Intake/Output Summary (Last 24 hours) at 08/18/11 1123 Last data filed at 08/18/11 0900  Gross per 24 hour  Intake    964 ml  Output   3000 ml  Net  -2036 ml    Is lying on bed flatbed without respiratory distress, pupil equally reactive to light and accommodation Neck supple,, no JVP Heart S1 and S2 no added murmur or gallop Lungs some rails mainly of the right lung than the left Extremities without edema Abdomen soft nontender bowel sounds present no organomegaly  Lab Results:  Basename 08/18/11 0642 08/17/11 2019 08/17/11 0856  NA 134* -- 138  K 3.0* -- 3.7  CL 97 -- 105  CO2 29 -- 26  GLUCOSE 90 -- 175*  BUN 22 -- 22  CREATININE 1.18* -- 1.08  CALCIUM 9.0 -- 9.1  MG -- 1.7 --  PHOS -- -- --    Basename 08/18/11 0642  AST 27  ALT 45*  ALKPHOS 71  BILITOT 0.5  PROT 6.3  ALBUMIN 3.1*   No results found for this basename: LIPASE:2,AMYLASE:2 in the last 72 hours  Basename 08/18/11 0642 08/17/11 0856  WBC 7.1 7.0  NEUTROABS -- 5.5  HGB 9.9* 10.3*  HCT 29.2* 30.3*  MCV 91.3 91.8  PLT 174 156    Basename 08/18/11 0642 08/17/11 2019  CKTOTAL 115 94  CKMB 2.8 3.6  CKMBINDEX -- --  TROPONINI <0.30 0.30*   No components found with this basename: POCBNP:3  Basename 08/17/11 1110  DDIMER 1.40*    Basename 08/17/11  1555  HGBA1C 6.5*    Basename 08/17/11 0856  CHOL 226*  HDL 72  LDLCALC 136*  TRIG 88  CHOLHDL 3.1  LDLDIRECT --   No results found for this basename: TSH,T4TOTAL,FREET3,T3FREE,THYROIDAB in the last 72 hours No results found for this basename: VITAMINB12:2,FOLATE:2,FERRITIN:2,TIBC:2,IRON:2,RETICCTPCT:2 in the last 72 hours  Micro Results: No results found for this or any previous visit (from the past 240 hour(s)).  Studies/Results: Dg Chest 2 View  08/17/2011  *RADIOLOGY REPORT*  Clinical Data: Shortness of breath  CHEST - 2 VIEW  Comparison: Portable chest x-ray of 07/14/2010 and CT angio chest of the same date  Findings: There are prominent interstitial markings with cardiomegaly most consistent with interstitial edema. An inflammatory process is difficult to exclude and follow up is recommended.  No definite effusion is seen.  The bones are osteopenic.  IMPRESSION: Cardiomegaly and probable interstitial edema.  Original Report Authenticated By: Juline Patch, M.D.   Ct Angio Chest W/cm &/or Wo Cm  08/17/2011  *RADIOLOGY REPORT*  Clinical Data:  Shortness of breath, chest pain, elevated D-dimer.  CT ANGIOGRAPHY CHEST WITH CONTRAST  Technique:  Multidetector CT imaging of the chest was performed using the standard protocol during bolus administration of intravenous contrast.  Multiplanar CT image reconstructions including MIPs were obtained to evaluate the vascular anatomy.  Contrast: OMNIPAQUE IOHEXOL 350 MG/ML IV SOLN chest x-ray 08/17/2011.  Comparison:   Chest CT 07/14/2010  Findings:  There are moderate bilateral pleural effusions. Cardiomegaly.  Coronary artery and aortic calcifications.  No aortic aneurysm or dissection.  No filling defects in the pulmonary arteries to suggest pulmonary emboli.  There is mild vascular congestion.  Ground-glass opacities throughout the lungs could reflect early edema.  There is compressive atelectasis in the lower lobes bilaterally. No  mediastinal, hilar, or axillary adenopathy. Visualized thyroid and chest wall soft tissues unremarkable. Imaging into the upper abdomen shows no acute findings.  Review of the MIP images confirms the above findings.  IMPRESSION: No evidence of pulmonary embolus.  Cardiomegaly.  Suspect mild interstitial edema.  Moderate bilateral effusions with compressive atelectasis in the lower lobes.  Coronary artery disease.  Original Report Authenticated By: Cyndie Chime, M.D.    Medications: I have reviewed the patient's current medications. Scheduled Meds:   . albuterol  2.5 mg Nebulization Q6H  . amLODipine  10 mg Oral Daily   And  . benazepril  20 mg Oral Daily  . aspirin EC  81 mg Oral Daily  . carvedilol  25 mg Oral BID WC  . citalopram  20 mg Oral Daily  . fluticasone  2 spray Each Nare Daily  . furosemide  40 mg Intravenous Q12H  . metFORMIN  500 mg Oral BID WC   And  . glyBURIDE  2.5 mg Oral BID WC  . heparin  5,000 Units Subcutaneous Q8H  . insulin aspart  0-9 Units Subcutaneous TID WC  . isosorbide dinitrate  30 mg Oral TID  . pantoprazole  40 mg Oral Daily  . potassium chloride  10 mEq Oral BID  . DISCONTD: amLODipine-benazepril  1 capsule Oral Daily  . DISCONTD: glyBURIDE  2.5 mg Oral BID WC  . DISCONTD: glyBURIDE  2.5 mg Oral BID WC  . DISCONTD: glyBURIDE-metformin  1 tablet Oral BID WC  . DISCONTD: metFORMIN  500 mg Oral BID WC  . DISCONTD: metFORMIN  500 mg Oral BID WC   Continuous Infusions:  PRN Meds:.iohexol, nitroGLYCERIN, traMADol, zolpidem  Assessment/Plan: 1-acute on chronic congestive heart failure with EF 40%, she is diuresing well, on negative fluid balance, would continue with the current Lasix, noticed an elevated troponin time onset he we'll continue cycling her enzymes, repeat EKG,, 2-D echo. If trending up we'll likely place the patient heparin drip and consulting the patient cardiology at [facility]. Patient and husband informed 2-history of STEMI: We'll continue  aspirin ,b blocker. Repeat EKG in the show significant T wave abnormality at the V2 V3 V5 and V6 and lead 1 and aVL am more worried that this patient could have a subendocardial infarct, alum on the process of contacting the cardiology on call covering [place] . will start the patient on heparin drip a stat. We'lltransferring the [facility] to [facility], will proceed with safety against this EKG changes since 5 am and no MD NOTIFIED  . I  I was not informed about this EKG changes, I was examining the patient and looking over the EKG note is that the change on the EKG, immediately contact Dr. Rennis Golden and he was very helpful and agreed to transfer the patient to [facility] for CATH, patient received  BRI LINTA 180 mg po stat, bolus with iv fluid , aspirin 325 mg stat given nitrolgycerin sublingual,  LOS: 1 day  Alisson Rozell I. 08/18/2011, 11:23 AM

## 2011-08-18 NOTE — Op Note (Signed)
Cardiac Catheterization Procedure Note  Name: Mary Cook MRN: [id number] DOB: 1926-06-03  Procedure: Left Heart Cath, Selective Coronary Angiography, LV angiography  Indication: This is an 77 year old female with known history of coronary artery disease status post angioplasty and drug-eluting stent placement to the left circumflex in 2010. She presented with substernal chest discomfort and dyspnea. She was admitted to an in hospital. Her cardiac enzymes were negative. However, she was noted to have deeply inverted T waves in the anterior leads. Today she started having more chest discomfort and was mildly hypotensive. Thus, an urgent cardiac catheterization was recommended. The ECG was not consistent with ST elevation myocardial infarction.   Medications:  Sedation:  1 mg IV Versed, 50 mcg IV Fentanyl  Contrast:  85 Omnipaque  Procedural details: The right groin was prepped, draped, and anesthetized with 1% lidocaine. Using modified Seldinger technique, a 5 French sheath was introduced into the right femoral artery. Standard Judkins catheters were used for coronary angiography and left ventriculography. A JL 3.5 catheter was used to engage the left anterior descending artery selectively .Catheter exchanges were performed over a guidewire. There were no immediate procedural complications. The patient was transferred to the post catheterization recovery area for further monitoring.   Procedural Findings:  Hemodynamics: AO:  126/56   mmHg LV:  126/9    mmHg LVEDP: 13  mmHg  Coronary angiography: Coronary dominance: Right   Left Main:  The left main overall is very short. The LAD and circumflex have basically 2 separate ostium.  Left Anterior Descending (LAD):  The vessel is normal in size and mildly to moderately calcified. There is mild diffuse 20% disease proximally. The rest of the vessel is free of significant disease.  1st diagonal (D1):  This vessel is overall large with an  80% tubular stenosis in the proximal segment which is unchanged from previous cardiac catheterization.  2nd diagonal (D2):  Small in size with mild ostial disease.  3rd diagonal (D3):  Very small in size and free of significant disease.  Circumflex (LCx):  The vessel is overall large in size and nondominant. A long segment the stented area is noted in the proximal and mid left circumflex. There is mild 30% restenosis in the proximal segment.  1st obtuse marginal:  Small in size and free of significant disease.  2nd obtuse marginal:  Normal in size and free of significant disease  3rd obtuse marginal:  Large size and free of significant disease.   The posterior AV groove is normal in size with mild ostial and proximal disease. It gives 1 posterior lateral branch which is free of significant disease.  Right Coronary Artery: The vessel is medium in size. It is moderately calcified overall. There is diffuse 50-60% disease proximally which does not appear to be changed from previous catheterization. The mid segment has mild diffuse 20% disease. There is minor irregularities distally.  posterior descending artery: The vessel is normal normal in size without any significant disease.  posterior lateral branch:  Small in size and free of significant disease. Left ventriculography: Left ventricular systolic function is mildly reduced , LVEF is estimated at 40 %, there is no significant mitral regurgitation . There is mild global hypokinesis.  Final Conclusions:  1. known significant 2 vessel coronary artery disease with patent stents in the left circumflex with only mild restenosis. No significant obstructive disease in the left anterior descending artery. The stenosis in first diagonal is unchanged from most recent catheterization. The RCA has moderate diffuse disease  overall. No culprit lesion is identified for unstable angina. 2. Mildly reduced LV systolic function with an estimated ejection fraction  of 40%. 3. Mildly elevated left ventricular end-diastolic pressure.  Recommendations: There is no evidence of plaque rupture on current angiogram. The ejection fraction is reduced but does not seem to be consistent with stress-induced cardiomyopathy. Agree with evaluation for a possible neurologic etiology. Continue treatment for coronary artery disease and heart failure. Resume heart failure medications once her pressure is not low.  Lorine Bears MD, [facility] 08/18/2011, 3:37 PM

## 2011-08-18 NOTE — Progress Notes (Signed)
Patient c/o chest pressure with EKG changes at 1145 today, 08/18/2011. MD notified. Patient transferred to [facility] by EMS. Report called to Marylene Land, RN [phone].

## 2011-08-18 NOTE — Progress Notes (Signed)
Patient going to cath lab at this time. No distress noted. Mary Cook

## 2011-08-19 LAB — GLUCOSE, CAPILLARY
Glucose-Capillary: 114 mg/dL — ABNORMAL HIGH (ref 70–99)
Glucose-Capillary: 151 mg/dL — ABNORMAL HIGH (ref 70–99)
Glucose-Capillary: 112 mg/dL — ABNORMAL HIGH (ref 70–99)

## 2011-08-19 LAB — URINE CULTURE
Colony Count: 60000
Culture  Setup Time: 201212232124

## 2011-08-19 LAB — CBC
HCT: 29.1 % — ABNORMAL LOW (ref 36.0–46.0)
MCV: 91.5 fL (ref 78.0–100.0)
Platelets: 152 10*3/uL (ref 150–400)
RBC: 3.18 MIL/uL — ABNORMAL LOW (ref 3.87–5.11)
WBC: 5.2 10*3/uL (ref 4.0–10.5)

## 2011-08-19 MED ORDER — AMLODIPINE BESYLATE 5 MG PO TABS
5.0000 mg | ORAL_TABLET | Freq: Every day | ORAL | Status: DC
  Administered 2011-08-19 – 2011-08-23 (×5): 5 mg via ORAL
  Filled 2011-08-19 (×5): qty 1

## 2011-08-19 MED ORDER — POTASSIUM CHLORIDE CRYS ER 20 MEQ PO TBCR
20.0000 meq | EXTENDED_RELEASE_TABLET | Freq: Two times a day (BID) | ORAL | Status: DC
  Administered 2011-08-19 – 2011-08-23 (×8): 20 meq via ORAL
  Filled 2011-08-19: qty 2
  Filled 2011-08-19 (×8): qty 1

## 2011-08-19 MED ORDER — CARVEDILOL 6.25 MG PO TABS
6.2500 mg | ORAL_TABLET | Freq: Two times a day (BID) | ORAL | Status: DC
  Administered 2011-08-19 – 2011-08-23 (×9): 6.25 mg via ORAL
  Filled 2011-08-19 (×11): qty 1

## 2011-08-19 MED ORDER — POTASSIUM CHLORIDE CRYS ER 20 MEQ PO TBCR
40.0000 meq | EXTENDED_RELEASE_TABLET | Freq: Once | ORAL | Status: AC
  Administered 2011-08-19: 40 meq via ORAL

## 2011-08-19 MED ORDER — FUROSEMIDE 10 MG/ML IJ SOLN
40.0000 mg | Freq: Two times a day (BID) | INTRAMUSCULAR | Status: DC
  Administered 2011-08-19 – 2011-08-20 (×4): 40 mg via INTRAVENOUS
  Filled 2011-08-19 (×6): qty 4

## 2011-08-19 NOTE — Progress Notes (Signed)
Pt. Seen and examined. Agree with the NP/PA-C note as written.  She still feels weak. EF shows moderate global reduction around 40%. I agree there is residual CHF. Bibasilar crackles. More diuresis today. EKG changes seem to have resolved by telemetry, check 12 lead. Reduce dose b-blocker.  Chrystie Nose, MD Attending Cardiologist [facility]

## 2011-08-19 NOTE — Progress Notes (Signed)
Subjective:  "weak" no chest pain or SOB  Objective:  Vital Signs in the last 24 hours: Temp:  [97.4 F (36.3 C)-98.4 F (36.9 C)] 97.9 F (36.6 C) (12/24 0350) Pulse Rate:  [59-73] 65  (12/23 2100) Resp:  [10-22] 18  (12/24 0350) BP: (86-149)/(49-70) 138/57 mmHg (12/24 0800) SpO2:  [95 %-100 %] 100 % (12/23 2100) Weight:  [79.5 kg (175 lb 4.3 oz)] 175 lb 4.3 oz (79.5 kg) (12/23 1348)  Intake/Output from previous day:  Intake/Output Summary (Last 24 hours) at 08/19/11 0825 Last data filed at 08/19/11 0500  Gross per 24 hour  Intake 1222.25 ml  Output   2275 ml  Net -1052.75 ml    Physical Exam: General appearance: alert, cooperative and no distress Lungs: clear to auscultation bilaterally Heart: regularly irregular rhythm   Rate: 70  Rhythm: normal sinus rhythm  Lab Results:  Basename 08/19/11 0500 08/18/11 0642  WBC 5.2 7.1  HGB 9.6* 9.9*  PLT 152 174    Basename 08/18/11 0642 08/17/11 0856  NA 134* 138  K 3.0* 3.7  CL 97 105  CO2 29 26  GLUCOSE 90 175*  BUN 22 22  CREATININE 1.18* 1.08    Basename 08/18/11 1145 08/18/11 0642  TROPONINI <0.30 <0.30   Hepatic Function Panel  Basename 08/18/11 0642  PROT 6.3  ALBUMIN 3.1*  AST 27  ALT 45*  ALKPHOS 71  BILITOT 0.5  BILIDIR --  IBILI --    Basename 08/17/11 0856  CHOL 226*   No results found for this basename: INR in the last 72 hours  Imaging: Dg Chest 2 View  08/17/2011  *RADIOLOGY REPORT*  Clinical Data: Shortness of breath  CHEST - 2 VIEW  Comparison: Portable chest x-ray of 07/14/2010 and CT angio chest of the same date  Findings: There are prominent interstitial markings with cardiomegaly most consistent with interstitial edema. An inflammatory process is difficult to exclude and follow up is recommended.  No definite effusion is seen.  The bones are osteopenic.  IMPRESSION: Cardiomegaly and probable interstitial edema.  Original Report Authenticated By: Juline Patch, M.D.   Ct Head Wo  Contrast  08/18/2011  *RADIOLOGY REPORT*  Clinical Data: Status post cardiac catheterization today.  Now with headache.  CT HEAD WITHOUT CONTRAST  Technique:  Contiguous axial images were obtained from the base of the skull through the vertex without contrast.  Comparison: Maxillofacial CT scan 05/14/2011 reviewed.  Findings: Enhancement of the vascular structures is consistent with cardiac catheterization.  The brain is atrophic with chronic microvascular ischemic change.  No evidence of acute infarction, hemorrhage, mass lesion, mass effect, midline shift or abnormal extra-axial fluid collection.  No hydrocephalus or pneumocephalus. The calvarium is intact.  IMPRESSION: No acute finding.  Atrophy and chronic microvascular ischemic change.  Original Report Authenticated By: Bernadene Bell. Maricela Curet, M.D.   Ct Angio Chest W/cm &/or Wo Cm  08/17/2011  *RADIOLOGY REPORT*  Clinical Data:  Shortness of breath, chest pain, elevated D-dimer.  CT ANGIOGRAPHY CHEST WITH CONTRAST  Technique:  Multidetector CT imaging of the chest was performed using the standard protocol during bolus administration of intravenous contrast.  Multiplanar CT image reconstructions including MIPs were obtained to evaluate the vascular anatomy.  Contrast: OMNIPAQUE IOHEXOL 350 MG/ML IV SOLN chest x-ray 08/17/2011.  Comparison:   Chest CT 07/14/2010  Findings:  There are moderate bilateral pleural effusions. Cardiomegaly.  Coronary artery and aortic calcifications.  No aortic aneurysm or dissection.  No filling defects in  the pulmonary arteries to suggest pulmonary emboli.  There is mild vascular congestion.  Ground-glass opacities throughout the lungs could reflect early edema.  There is compressive atelectasis in the lower lobes bilaterally. No mediastinal, hilar, or axillary adenopathy. Visualized thyroid and chest wall soft tissues unremarkable. Imaging into the upper abdomen shows no acute findings.  Review of the MIP images confirms  the above findings.  IMPRESSION: No evidence of pulmonary embolus.  Cardiomegaly.  Suspect mild interstitial edema.  Moderate bilateral effusions with compressive atelectasis in the lower lobes.  Coronary artery disease.  Original Report Authenticated By: Cyndie Chime, M.D.    Cardiac Studies:  Assessment/Plan:   Principal Problem:  *Acute pulmonary edema, MI r/o  Active Problems:  Transient anterior TWI, ? Coronary spasm  Acute systolic congestive heart failure, BNP 2431  Ischemic cardiomyopathy, EF 40% at cath  Cardiogenic shock ? ( transient hypotension at [facility])  CAD (coronary artery disease), Ant MI, CFX DES 9/11, no significant ISR or progression by cath this adm  Anemia, Hgb 9.6  DIABETES MELLITUS, TYPE II  HYPERLIPIDEMIA  HYPERTENSION  Sleep apnea, on C-Pap  VITAMIN [name] DEFICIENCY   Plan- lasix 40 mg IV today. Up to chair, replace K+, ? Resume beta blocker at lower dose, hold ACE for now.   Corine Shelter PA-C 08/19/2011, 8:25 AM

## 2011-08-20 DIAGNOSIS — R9431 Abnormal electrocardiogram [ECG] [EKG]: Secondary | ICD-10-CM | POA: Diagnosis present

## 2011-08-20 LAB — BASIC METABOLIC PANEL
BUN: 22 mg/dL (ref 6–23)
CO2: 30 mEq/L (ref 19–32)
Calcium: 9.3 mg/dL (ref 8.4–10.5)
Chloride: 99 mEq/L (ref 96–112)
Creatinine, Ser: 1.32 mg/dL — ABNORMAL HIGH (ref 0.50–1.10)
GFR calc Af Amer: 42 mL/min — ABNORMAL LOW (ref >90)
GFR calc non Af Amer: 36 mL/min — ABNORMAL LOW (ref >90)
Glucose, Bld: 106 mg/dL — ABNORMAL HIGH (ref 70–99)
Potassium: 3.8 mEq/L (ref 3.5–5.1)
Sodium: 136 mEq/L (ref 135–145)

## 2011-08-20 LAB — CBC
MCH: 29.9 pg (ref 26.0–34.0)
MCHC: 32.8 g/dL (ref 30.0–36.0)
MCV: 91.3 fL (ref 78.0–100.0)
Platelets: 161 10*3/uL (ref 150–400)
RDW: 12.6 % (ref 11.5–15.5)

## 2011-08-20 LAB — GLUCOSE, CAPILLARY: Glucose-Capillary: 170 mg/dL — ABNORMAL HIGH (ref 70–99)

## 2011-08-20 LAB — PRO B NATRIURETIC PEPTIDE: Pro B Natriuretic peptide (BNP): 1895 pg/mL — ABNORMAL HIGH (ref 0–450)

## 2011-08-20 NOTE — Progress Notes (Signed)
Subjective:  SOB improving, no angina  Objective:  Vital Signs in the last 24 hours: Temp:  [98 F (36.7 C)-98.4 F (36.9 C)] 98.4 F (36.9 C) (12/25 0400) Pulse Rate:  [55-63] 55  (12/25 0400) Resp:  [15-18] 15  (12/24 1634) BP: (120-142)/(42-58) 142/58 mmHg (12/25 0800) SpO2:  [92 %-99 %] 95 % (12/25 0800)  Intake/Output from previous day:  Intake/Output Summary (Last 24 hours) at 08/20/11 0820 Last data filed at 08/20/11 0500  Gross per 24 hour  Intake    490 ml  Output   3250 ml  Net  -2760 ml    Physical Exam: General appearance: alert, cooperative and no distress Lungs: few crackles bilat Heart: regular rate and rhythm   Rate: 60  Rhythm: normal sinus rhythm  Lab Results:  Basename 08/20/11 0505 08/19/11 0500  WBC 6.3 5.2  HGB 10.0* 9.6*  PLT 161 152    Basename 08/20/11 0505 08/18/11 0642  NA 136 134*  K 3.8 3.0*  CL 99 97  CO2 30 29  GLUCOSE 106* 90  BUN 22 22  CREATININE 1.32* 1.18*    Basename 08/18/11 1145 08/18/11 0642  TROPONINI <0.30 <0.30   Hepatic Function Panel  Basename 08/18/11 0642  PROT 6.3  ALBUMIN 3.1*  AST 27  ALT 45*  ALKPHOS 71  BILITOT 0.5  BILIDIR --  IBILI --    Basename 08/17/11 0856  CHOL 226*   No results found for this basename: INR in the last 72 hours  Imaging: Ct Head Wo Contrast  08/18/2011  *RADIOLOGY REPORT*  Clinical Data: Status post cardiac catheterization today.  Now with headache.  CT HEAD WITHOUT CONTRAST  Technique:  Contiguous axial images were obtained from the base of the skull through the vertex without contrast.  Comparison: Maxillofacial CT scan 05/14/2011 reviewed.  Findings: Enhancement of the vascular structures is consistent with cardiac catheterization.  The brain is atrophic with chronic microvascular ischemic change.  No evidence of acute infarction, hemorrhage, mass lesion, mass effect, midline shift or abnormal extra-axial fluid collection.  No hydrocephalus or pneumocephalus. The  calvarium is intact.  IMPRESSION: No acute finding.  Atrophy and chronic microvascular ischemic change.  Original Report Authenticated By: Bernadene Bell. Maricela Curet, M.D.    Cardiac Studies:  Assessment/Plan:   Active Problems:  Acute systolic congestive heart failure, probably mixed  Systolic/diastolic with an EF of 40% at cath. Diuresing but BNP still high   CAD (coronary artery disease), Ant MI, CFX DES 9/11, no ISR or progression at cath this adm  Abnormal EKG, marked new ant TWI this adm with negative Troponins  DIABETES MELLITUS, TYPE II  HYPERLIPIDEMIA  HYPERTENSION  Cardiogenic shock, low b/p at [facility], (not seen here)  Sleep apnea, not on C-Pap at home because of "sinus issues"  VITAMIN [name] DEFICIENCY   Plan-continue IV lasix, keep in TCU with markedly abn EKG.   Corine Shelter PA-C 08/20/2011, 8:20 AM  Agree with note written by Corine Shelter [facility]  Admitted in transfer from [facility] with ? CGS and CHF. BNP only 1900. Urgent cath performed by Dr. Kirke Corin showed patent LCX stent with minimal other CAD & moderate LVD with an EF of 40%. Feels clinically improved since admission. Etiol of diffuse Antlat TWI still unclear. Continue IV lasix for now. Follow BNP. Ambulate. Keep in TCU today.  Runell Gess 08/20/2011 8:58 AM

## 2011-08-20 NOTE — Plan of Care (Signed)
Problem: Phase I Progression Outcomes Goal: Initial discharge plan identified Outcome: Completed/Met Date Met:  08/20/11 Plan to return home with spouse  Problem: Phase II Progression Outcomes Goal: Discharge plan established Outcome: Completed/Met Date Met:  08/20/11 W/spouse home

## 2011-08-21 ENCOUNTER — Inpatient Hospital Stay (HOSPITAL_COMMUNITY): Payer: Medicare PPO

## 2011-08-21 LAB — PRO B NATRIURETIC PEPTIDE: Pro B Natriuretic peptide (BNP): 1385 pg/mL — ABNORMAL HIGH (ref 0–450)

## 2011-08-21 LAB — URINALYSIS, ROUTINE W REFLEX MICROSCOPIC
Ketones, ur: NEGATIVE mg/dL
Leukocytes, UA: NEGATIVE
Nitrite: NEGATIVE
Specific Gravity, Urine: 1.015 (ref 1.005–1.030)
Urobilinogen, UA: 0.2 mg/dL (ref 0.0–1.0)
pH: 5 (ref 5.0–8.0)

## 2011-08-21 LAB — BASIC METABOLIC PANEL
BUN: 28 mg/dL — ABNORMAL HIGH (ref 6–23)
CO2: 32 mEq/L (ref 19–32)
Calcium: 9.2 mg/dL (ref 8.4–10.5)
Chloride: 98 mEq/L (ref 96–112)
Creatinine, Ser: 1.46 mg/dL — ABNORMAL HIGH (ref 0.50–1.10)
GFR calc Af Amer: 37 mL/min — ABNORMAL LOW (ref >90)
GFR calc non Af Amer: 32 mL/min — ABNORMAL LOW (ref >90)
Glucose, Bld: 125 mg/dL — ABNORMAL HIGH (ref 70–99)
Potassium: 4.1 mEq/L (ref 3.5–5.1)
Sodium: 136 mEq/L (ref 135–145)

## 2011-08-21 LAB — GLUCOSE, CAPILLARY
Glucose-Capillary: 156 mg/dL — ABNORMAL HIGH (ref 70–99)
Glucose-Capillary: 139 mg/dL — ABNORMAL HIGH (ref 70–99)
Glucose-Capillary: 121 mg/dL — ABNORMAL HIGH (ref 70–99)

## 2011-08-21 LAB — BLOOD GAS, ARTERIAL
Acid-Base Excess: 4 mmol/L — ABNORMAL HIGH (ref 0.0–2.0)
TCO2: 29.7 mmol/L (ref 0–100)
pCO2 arterial: 44.8 mmHg (ref 35.0–45.0)
pO2, Arterial: 67.8 mmHg — ABNORMAL LOW (ref 80.0–100.0)

## 2011-08-21 LAB — CBC
Hemoglobin: 10.5 g/dL — ABNORMAL LOW (ref 12.0–15.0)
MCH: 30.5 pg (ref 26.0–34.0)
MCV: 91.3 fL (ref 78.0–100.0)
RBC: 3.44 MIL/uL — ABNORMAL LOW (ref 3.87–5.11)
WBC: 6.6 10*3/uL (ref 4.0–10.5)

## 2011-08-21 MED ORDER — FUROSEMIDE 40 MG PO TABS
40.0000 mg | ORAL_TABLET | Freq: Two times a day (BID) | ORAL | Status: DC
  Administered 2011-08-21 – 2011-08-23 (×6): 40 mg via ORAL
  Filled 2011-08-21 (×7): qty 1

## 2011-08-21 NOTE — Consult Note (Signed)
PCP:  Sonda Primes, MD, MD   Reason for Consult: Evaluation and management of fatigue, failure to thrive and subjective weakness  Referring Physician: Dr. Chrystie Nose  Chief complaints: Lack of energy, dyspnea, anxiety and depression   HPI: Patient is an 77 year old female with history of hypertension, type 2 diabetes mellitus, hyperlipidemia, coronary artery disease, obstructive sleep apnea who was initially admitted to [facility] for worsening dyspnea and chest pain. Followup EKG showed significant T wave inversion and patient was transferred to the [facility] for further evaluation. Patient is status post cardiac cath and is being diuresed for acute on chronic combined congestive heart failure. She indicates that her dyspnea is significantly improved.  Patient gives more than a year long history of ongoing dyspnea, not on home oxygen, fatigue, lack of energy. She says that she has been depressed and anxious from her previous marriage 46 years ago. Her first husband was an alcoholic. She is anxious about many things including her poor health, worried that something serious is going to happen to her, anxious that she may die. She is also anxious and worried about her 29 year old grandson who apparently gets into trouble a lot according to the patient and her spouse. She has a habit of sleeping only one or 2 hours at night and staying awake the rest of the time and sleeps mostly during the daytime. She has stopped using her CPAP on the advice of her primary care physician almost 2-3 months ago. She also gets upset with her husband when he travels once a month to do volunteer work and she has to go with him. She gets upset with him when he goes shopping for long. She denies delusions or hallucinations. She denies suicidal or homicidal ideations. She tells [place] that she has not mentioned anything about the anxiety or depression to any of her M.D. She is also saddened because she lost  her 45 year old daughter from lung cancer approximately 13 years ago. She worries that she may also have lung cancer. She has a great appetite and actually volunteers to eating a lot.   Past Medical History: Past Medical History  Diagnosis Date  . Hypertension   . Osteoarthritis   . Osteopenia   . Diverticulosis of colon   . Diabetes mellitus     type II  . Hyperlipidemia   . CAD (coronary artery disease)   . MI (myocardial infarction) 05-18-11    Dr Tresa Endo, Rx'd with CFX  DES  . OSA on CPAP   . Asthma   . Stroke     Past Surgical History: Past Surgical History  Procedure Date  . Abdominal hysterectomy 1980    no bso  . 2 stents 9-11    Allergies:   Allergies  Allergen Reactions  . Clopidogrel Bisulfate Nausea And Vomiting  . Lisinopril Cough    Medications: Prior to Admission medications   Medication Sig Start Date End Date Taking? Authorizing Provider  aspirin 81 MG EC tablet Take 81 mg by mouth daily.     Yes Historical Provider, MD  carvedilol (COREG) 25 MG tablet Take 25 mg by mouth every morning.     Yes Historical Provider, MD  Cyanocobalamin 3000 MCG SUBL Place 1 tablet under the tongue daily.     Yes Historical Provider, MD  fluticasone (FLONASE) 50 MCG/ACT nasal spray Place 2 sprays into the nose daily. 07/01/11  Yes Alex Plotnikov, MD  furosemide (LASIX) 40 MG tablet Take 1 tablet (  40 mg total) by mouth daily. 07/19/11  Yes Sonda Primes, MD  metFORMIN (GLUCOPHAGE) 500 MG tablet Take 1 tablet (500 mg total) by mouth 2 (two) times daily with a meal. 07/09/11 07/08/12 Yes Sonda Primes, MD  pantoprazole (PROTONIX) 40 MG tablet Take 1 tablet (40 mg total) by mouth daily. 07/01/11  Yes Sonda Primes, MD  ramipril (ALTACE) 10 MG capsule Take 1 tablet by mouth Daily. 07/18/11  Yes Historical Provider, MD  traMADol (ULTRAM) 50 MG tablet Take 1-2 tablets (50-100 mg total) by mouth 2 (two) times daily as needed for pain. 1-2 tabs bid prn for pain 07/01/11  Yes Sonda Primes, MD  ACCU-CHEK SOFTCLIX LANCETS lancets by Other route daily as needed. Use as instructed    Historical Provider, MD  citalopram (CELEXA) 20 MG tablet Take 1 tablet (20 mg total) by mouth daily. 07/01/11   Sonda Primes, MD  glucose blood (ACCU-CHEK AVIVA) test strip 1 each by Other route as needed. Use as instructed     Historical Provider, MD  nitroGLYCERIN (NITROSTAT) 0.4 MG SL tablet Place 1 tablet (0.4 mg total) under the tongue every 5 (five) minutes as needed. 07/01/11   Sonda Primes, MD    Family History: Family History  Problem Relation Age of Onset  . Hypertension Other     Social History:  reports that she has never smoked. She does not have any smokeless tobacco history on file. She reports that she does not drink alcohol or use illicit drugs.  Review of Systems:  All systems reviewed and apart from history of presenting illness is negative. She says dyspnea significantly improved. She denies chest pain or palpitations. She does however volunteered to intermittent constipation and diarrhea ongoing also for greater than a year. She denies abdominal pain. No history of dysuria or urinary frequency.  Physical Exam: Filed Vitals:   08/21/11 1100 08/21/11 1151 08/21/11 1555 08/21/11 1722  BP:  129/55 118/39 119/40  Pulse:  61 61 69  Temp:  98.5 F (36.9 C) 98.1 F (36.7 C)   TempSrc:  Oral Oral   Resp:  18 16   Height:      Weight:      SpO2: 96% 93% 95%    General exam: Patient is seen sitting comfortably in bed and is in no obvious distress. She is moderately built and nourished. Head, eyes and ENT. Nontraumatic, normocephalic. Pupils equally reacting to light and accommodation. Lymphatics: No lymphadenopathy Neck: Supple. No JVD or carotid bruit Respiratory system: Clear. No increased work of breathing. Cardiovascular system: First and second heart sounds heard, regular. No JVD or murmurs. Gastrointestinal system: Abdomen is nondistended, soft and normal  bowel sounds heard. Central nervous system: Alert and oriented. No focal neurological deficits. Extremities: Trace bilateral leg edema. Psychiatry: Patient has a flat affect. Answers appropriately.   Labs on Admission:   [facility] 08/21/11 0500 08/20/11 0505  NA 136 136  K 4.1 3.8  CL 98 99  CO2 32 30  GLUCOSE 125* 106*  BUN 28* 22  CREATININE 1.46* 1.32*  CALCIUM 9.2 9.3  MG -- --  PHOS -- --   No results found for this basename: AST:2,ALT:2,ALKPHOS:2,BILITOT:2,PROT:2,ALBUMIN:2 in the last 72 hours No results found for this basename: LIPASE:2,AMYLASE:2 in the last 72 hours  Basename 08/21/11 0500 08/20/11 0505  WBC 6.6 6.3  NEUTROABS -- --  HGB 10.5* 10.0*  HCT 31.4* 30.5*  MCV 91.3 91.3  PLT 166 161   No results found for this basename:  CKTOTAL:3,CKMB:3,CKMBINDEX:3,TROPONINI:3 in the last 72 hours No results found for this basename: TSH,T4TOTAL,FREET3,T3FREE,THYROIDAB in the last 72 hours No results found for this basename: VITAMINB12:2,FOLATE:2,FERRITIN:2,TIBC:2,IRON:2,RETICCTPCT:2 in the last 72 hours  Radiological Exams on Admission: Dg Chest 2 View  08/21/2011  *RADIOLOGY REPORT*  Clinical Data: CHF and short of breath  CHEST - 2 VIEW  Comparison: 08/17/2011  Findings: Improved aeration compared with the prior study. Pulmonary edema has resolved.  Small pleural effusions remain. Bibasilar atelectasis has resolved.  Negative for pneumonia.  IMPRESSION: Resolving congestive heart failure with small residual pleural effusions bilaterally.  Original Report Authenticated By: Camelia Phenes, M.D.   Dg Chest 2 View  08/17/2011  *RADIOLOGY REPORT*  Clinical Data: Shortness of breath  CHEST - 2 VIEW  Comparison: Portable chest x-ray of 07/14/2010 and CT angio chest of the same date  Findings: There are prominent interstitial markings with cardiomegaly most consistent with interstitial edema. An inflammatory process is difficult to exclude and follow up is recommended.  No  definite effusion is seen.  The bones are osteopenic.  IMPRESSION: Cardiomegaly and probable interstitial edema.  Original Report Authenticated By: Juline Patch, M.D.   Ct Head Wo Contrast  08/18/2011  *RADIOLOGY REPORT*  Clinical Data: Status post cardiac catheterization today.  Now with headache.  CT HEAD WITHOUT CONTRAST  Technique:  Contiguous axial images were obtained from the base of the skull through the vertex without contrast.  Comparison: Maxillofacial CT scan 05/14/2011 reviewed.  Findings: Enhancement of the vascular structures is consistent with cardiac catheterization.  The brain is atrophic with chronic microvascular ischemic change.  No evidence of acute infarction, hemorrhage, mass lesion, mass effect, midline shift or abnormal extra-axial fluid collection.  No hydrocephalus or pneumocephalus. The calvarium is intact.  IMPRESSION: No acute finding.  Atrophy and chronic microvascular ischemic change.  Original Report Authenticated By: Bernadene Bell. Maricela Curet, M.D.   Ct Angio Chest W/cm &/or Wo Cm  08/17/2011  *RADIOLOGY REPORT*  Clinical Data:  Shortness of breath, chest pain, elevated D-dimer.  CT ANGIOGRAPHY CHEST WITH CONTRAST  Technique:  Multidetector CT imaging of the chest was performed using the standard protocol during bolus administration of intravenous contrast.  Multiplanar CT image reconstructions including MIPs were obtained to evaluate the vascular anatomy.  Contrast: OMNIPAQUE IOHEXOL 350 MG/ML IV SOLN chest x-ray 08/17/2011.  Comparison:   Chest CT 07/14/2010  Findings:  There are moderate bilateral pleural effusions. Cardiomegaly.  Coronary artery and aortic calcifications.  No aortic aneurysm or dissection.  No filling defects in the pulmonary arteries to suggest pulmonary emboli.  There is mild vascular congestion.  Ground-glass opacities throughout the lungs could reflect early edema.  There is compressive atelectasis in the lower lobes bilaterally. No mediastinal,  hilar, or axillary adenopathy. Visualized thyroid and chest wall soft tissues unremarkable. Imaging into the upper abdomen shows no acute findings.  Review of the MIP images confirms the above findings.  IMPRESSION: No evidence of pulmonary embolus.  Cardiomegaly.  Suspect mild interstitial edema.  Moderate bilateral effusions with compressive atelectasis in the lower lobes.  Coronary artery disease.  Original Report Authenticated By: Cyndie Chime, M.D.      Assessment/Plan 1. Lack of energy, dyspnea and failure to thrive: There seems to be a big element of generalized anxiety and depression which seem to be a long-standing problems. This is complicated by her poor physical state and family dynamics. TSH was checked and normal. Will obtain [name] levels, urinalysis to rule out urinary tract infection  and an ABG to rule out CO2 retention. She is on when necessary Xanax and Celexa. Recommend obtaining a psychiatric consultation for further assistance. Reassured patient and her spouse who who was at the bedside. 2. Acute on chronic combined congestive heart failure: Management per the primary service 3. Acute renal failure: Secondary to diuresis and contrast from cardiac catheterization. Diuretics have been reduced. Monitor daily BMP. 4. Type 2 diabetes mellitus: Continue sliding scale insulin 5. Anemia: Stable  Thank you for allowing [place] to participate in this patient's care.   Weltha Cathy 08/21/2011, 6:41 PM

## 2011-08-21 NOTE — Progress Notes (Signed)
[facility]  DAILY PROGRESS NOTE   Subjective:  Creatinine continues to increase with diuresis. Net output was 2.7L. Still dyspneic. Feels "weak".  Objective:  Temp:  [97.7 F (36.5 C)-98.9 F (37.2 C)] 98.3 F (36.8 C) (12/26 0700) Pulse Rate:  [59-68] 59  (12/26 0700) Resp:  [16-18] 16  (12/26 0700) BP: (108-142)/(35-58) 123/48 mmHg (12/26 0700) SpO2:  [95 %-99 %] 98 % (12/26 0700) Weight change:   Intake/Output from previous day: 12/25 0701 - 12/26 0700 In: 1184 [P.O.:1180; IV Piggyback:4] Out: -   Intake/Output from this shift:    Medications: Current Facility-Administered Medications  Medication Dose Route Frequency Provider Last Rate Last Dose  . acetaminophen (TYLENOL) tablet 650 mg  650 mg Oral Q4H PRN Iran Ouch, MD      . ALPRAZolam Prudy Feeler) tablet 0.25 mg  0.25 mg Oral BID PRN Abelino Derrick, PA   0.25 mg at 08/18/11 2215  . amLODipine (NORVASC) tablet 5 mg  5 mg Oral Daily Eda Paschal [place], [place]   5 mg at 08/20/11 0951  . aspirin EC tablet 81 mg  81 mg Oral Daily Hind I. Elsaid   81 mg at 08/20/11 0951  . carvedilol (COREG) tablet 6.25 mg  6.25 mg Oral BID WC Abelino Derrick, PA   6.25 mg at 08/20/11 1740  . citalopram (CELEXA) tablet 20 mg  20 mg Oral Daily Hind I. Elsaid   20 mg at 08/20/11 0951  . fluticasone (FLONASE) 50 MCG/ACT nasal spray 2 spray  2 spray Each Nare Daily Hind I. Elsaid   2 spray at 08/20/11 0951  . furosemide (LASIX) injection 40 mg  40 mg Intravenous Q12H Eda Paschal [place], PA   40 mg at 08/20/11 2149  . insulin aspart (novoLOG) injection 0-5 Units  0-5 Units Subcutaneous QHS Luke K [place], [place]      . insulin aspart (novoLOG) injection 0-9 Units  0-9 Units Subcutaneous TID WC Hind I. Elsaid   2 Units at 08/20/11 1740  . magnesium hydroxide (MILK OF MAGNESIA) suspension 30 mL  30 mL Oral QHS PRN Lisette Abu. Ranyah Groeneveld   30 mL at 08/20/11 1545  . nitroGLYCERIN (NITROSTAT) SL tablet 0.4 mg  0.4 mg Sublingual Q5 min PRN Hind I.  Elsaid   0.4 mg at 08/18/11 1150  . pantoprazole (PROTONIX) EC tablet 40 mg  40 mg Oral Daily Hind I. Elsaid   40 mg at 08/20/11 1200  . potassium chloride SA (K-DUR,KLOR-CON) CR tablet 20 mEq  20 mEq Oral BID Eda Paschal [place], [place]   20 mEq at 08/20/11 2150  . rosuvastatin (CRESTOR) tablet 40 mg  40 mg Oral q1800 Iran Ouch, MD   40 mg at 08/20/11 1740  . sodium chloride 0.9 % injection 3 mL  3 mL Intravenous PRN Abelino Derrick, PA      . traMADol Janean Sark) tablet 50-100 mg  50-100 mg Oral BID PRN Hind I. Elsaid      . zolpidem (AMBIEN) tablet 5 mg  5 mg Oral QHS PRN Hind I. Elsaid   5 mg at 08/20/11 2149    Physical Exam: General appearance: alert and no distress Neck: no adenopathy, no carotid bruit, no JVD, supple, symmetrical, trachea midline and thyroid not enlarged, symmetric, no tenderness/mass/nodules Lungs: rales bibasilar Heart: regular rate and rhythm, S1, S2 normal, no murmur, click, rub or gallop Abdomen: soft, non-tender; bowel sounds normal; no masses,  no organomegaly Extremities: extremities normal, atraumatic,  no cyanosis or edema Pulses: 2+ and symmetric Skin: pale Neurologic: Grossly normal  Lab Results: Results for orders placed during the hospital encounter of 08/17/11 (from the past 48 hour(s))  GLUCOSE, CAPILLARY     Status: Abnormal   Collection Time   08/19/11  8:06 AM      Component Value Range Comment   Glucose-Capillary 114 (*) 70 - 99 (mg/dL)   GLUCOSE, CAPILLARY     Status: Abnormal   Collection Time   08/19/11 11:59 AM      Component Value Range Comment   Glucose-Capillary 151 (*) 70 - 99 (mg/dL)   GLUCOSE, CAPILLARY     Status: Abnormal   Collection Time   08/19/11  4:25 PM      Component Value Range Comment   Glucose-Capillary 112 (*) 70 - 99 (mg/dL)   GLUCOSE, CAPILLARY     Status: Abnormal   Collection Time   08/19/11 10:07 PM      Component Value Range Comment   Glucose-Capillary 172 (*) 70 - 99 (mg/dL)   CBC     Status: Abnormal    Collection Time   08/20/11  5:05 AM      Component Value Range Comment   WBC 6.3  4.0 - 10.5 (K/uL)    RBC 3.34 (*) 3.87 - 5.11 (MIL/uL)    Hemoglobin 10.0 (*) 12.0 - 15.0 (g/dL)    HCT 40.9 (*) 81.1 - 46.0 (%)    MCV 91.3  78.0 - 100.0 (fL)    MCH 29.9  26.0 - 34.0 (pg)    MCHC 32.8  30.0 - 36.0 (g/dL)    RDW 91.4  78.2 - 95.6 (%)    Platelets 161  150 - 400 (K/uL)   BASIC METABOLIC PANEL     Status: Abnormal   Collection Time   08/20/11  5:05 AM      Component Value Range Comment   Sodium 136  135 - 145 (mEq/L)    Potassium 3.8  3.5 - 5.1 (mEq/L)    Chloride 99  96 - 112 (mEq/L)    CO2 30  19 - 32 (mEq/L)    Glucose, Bld 106 (*) 70 - 99 (mg/dL)    BUN 22  6 - 23 (mg/dL)    Creatinine, Ser 2.13 (*) 0.50 - 1.10 (mg/dL)    Calcium 9.3  8.4 - 10.5 (mg/dL)    GFR calc non Af Amer 36 (*) >90 (mL/min)    GFR calc Af Amer 42 (*) >90 (mL/min)   PRO B NATRIURETIC PEPTIDE     Status: Abnormal   Collection Time   08/20/11  5:20 AM      Component Value Range Comment   Pro B Natriuretic peptide (BNP) 1895.0 (*) 0 - 450 (pg/mL)   GLUCOSE, CAPILLARY     Status: Abnormal   Collection Time   08/20/11  8:07 AM      Component Value Range Comment   Glucose-Capillary 124 (*) 70 - 99 (mg/dL)   GLUCOSE, CAPILLARY     Status: Abnormal   Collection Time   08/20/11 12:02 PM      Component Value Range Comment   Glucose-Capillary 140 (*) 70 - 99 (mg/dL)    Comment 1 Documented in Chart      Comment 2 Notify RN     GLUCOSE, CAPILLARY     Status: Abnormal   Collection Time   08/20/11  5:36 PM      Component Value Range Comment  Glucose-Capillary 170 (*) 70 - 99 (mg/dL)   GLUCOSE, CAPILLARY     Status: Abnormal   Collection Time   08/20/11  9:33 PM      Component Value Range Comment   Glucose-Capillary 124 (*) 70 - 99 (mg/dL)   CBC     Status: Abnormal   Collection Time   08/21/11  5:00 AM      Component Value Range Comment   WBC 6.6  4.0 - 10.5 (K/uL)    RBC 3.44 (*) 3.87 - 5.11  (MIL/uL)    Hemoglobin 10.5 (*) 12.0 - 15.0 (g/dL)    HCT 40.9 (*) 81.1 - 46.0 (%)    MCV 91.3  78.0 - 100.0 (fL)    MCH 30.5  26.0 - 34.0 (pg)    MCHC 33.4  30.0 - 36.0 (g/dL)    RDW 91.4  78.2 - 95.6 (%)    Platelets 166  150 - 400 (K/uL)   BASIC METABOLIC PANEL     Status: Abnormal   Collection Time   08/21/11  5:00 AM      Component Value Range Comment   Sodium 136  135 - 145 (mEq/L)    Potassium 4.1  3.5 - 5.1 (mEq/L)    Chloride 98  96 - 112 (mEq/L)    CO2 32  19 - 32 (mEq/L)    Glucose, Bld 125 (*) 70 - 99 (mg/dL)    BUN 28 (*) 6 - 23 (mg/dL)    Creatinine, Ser 2.13 (*) 0.50 - 1.10 (mg/dL)    Calcium 9.2  8.4 - 10.5 (mg/dL)    GFR calc non Af Amer 32 (*) >90 (mL/min)    GFR calc Af Amer 37 (*) >90 (mL/min)   PRO B NATRIURETIC PEPTIDE     Status: Abnormal   Collection Time   08/21/11  5:00 AM      Component Value Range Comment   Pro B Natriuretic peptide (BNP) 1385.0 (*) 0 - 450 (pg/mL)     Imaging: No results found.  Assessment:  1. Active Problems: 2.  DIABETES MELLITUS, TYPE II 3.  VITAMIN [name] DEFICIENCY 4.  HYPERLIPIDEMIA 5.  HYPERTENSION 6.  Acute systolic congestive heart failure, probably mixed systolic/diastolic, EF 40% at cath 7.  Cardiogenic shock, low b/p at [facility], not seen here 8.  CAD (coronary artery disease), Ant MI, CFX DES 9/11, no ISR or progression at cath this adm 9.  Sleep apnea, stopped secondary to "sinus issues" 10.  Abnormal EKG, marked new ant TWI this adm with negative Troponins 11.   Plan:  1. Creatinine continues to rise with diuresis. Will decrease diuretics today. She continues to feel weak, somewhat failing to thrive, appetite is not good. She has a history of sleep apnea and apparently doesn't sleep well at night, but typically sleeps.  We may need help from internal medicine for fatigue, failure to thrive and subjective weakness. She feels that she has no energy. No clear etiology .Marland Kitchen Don't think these symptoms are from heart  failure.   Time Spent Directly with Patient:  30 minutes  Length of Stay:  LOS: 4 days   Chrystie Nose, MD Attending Cardiologist [facility]  Marwah Disbro C 08/21/2011, 7:48 AM

## 2011-08-22 LAB — CBC
HCT: 30 % — ABNORMAL LOW (ref 36.0–46.0)
Hemoglobin: 10 g/dL — ABNORMAL LOW (ref 12.0–15.0)
MCHC: 33.3 g/dL (ref 30.0–36.0)
RBC: 3.28 MIL/uL — ABNORMAL LOW (ref 3.87–5.11)
WBC: 5.6 10*3/uL (ref 4.0–10.5)

## 2011-08-22 LAB — GLUCOSE, CAPILLARY: Glucose-Capillary: 187 mg/dL — ABNORMAL HIGH (ref 70–99)

## 2011-08-22 LAB — VITAMIN B12: Vitamin B-12: 741 pg/mL (ref 211–911)

## 2011-08-22 MED ORDER — METFORMIN HCL 500 MG PO TABS
500.0000 mg | ORAL_TABLET | Freq: Two times a day (BID) | ORAL | Status: DC
  Administered 2011-08-22 – 2011-08-23 (×3): 500 mg via ORAL
  Filled 2011-08-22 (×5): qty 1

## 2011-08-22 NOTE — Plan of Care (Signed)
Problem: Food- and Nutrition-Related Knowledge Deficit (NB-1.1) Goal: Nutrition education Formal process to instruct or train a patient/client in a skill or to impart knowledge to help patients/clients voluntarily manage or modify food choices and eating behavior to maintain or improve health.  Outcome: Completed/Met Date Met:  08/22/11 RD consulted for diet education. Pt reports that PTA she was eating whatever she wanted, following no diet restrictions. This RD discussed basics of heart healthy diet education, heart failure nutrition therapy, and CHO counting. Pt verbalized understanding. Goals met. Written education also provided.

## 2011-08-22 NOTE — Progress Notes (Signed)
Patient ID: Mary Cook, female   DOB: [birth year], 77 y.o.   MRN: [id number] Subjective: Patient seen.Feels better today.Denies any specific complaints  Objective: Weight change:   Intake/Output Summary (Last 24 hours) at 08/22/11 0753 Last data filed at 08/22/11 0400  Gross per 24 hour  Intake    480 ml  Output   2000 ml  Net  -1520 ml   BP 132/38  Pulse 76  Temp(Src) 97.7 F (36.5 C) (Oral)  Resp 16  Ht 5\' 5"  (1.651 m)  Wt 76.8 kg (169 lb 5 oz)  BMI 28.18 kg/m2  SpO2 97% Physical Exam: General appearance: alert, cooperative and no distress,mild pallor Head: Normocephalic, without obvious abnormality, atraumatic Neck: no adenopathy, no carotid bruit, no JVD, supple, symmetrical, trachea midline and thyroid not enlarged, symmetric, no tenderness/mass/nodules Lungs: clear to auscultation bilaterally Heart: regular rate and rhythm, S1, S2 normal, no murmur, click, rub or gallop Abdomen: soft, non-tender; bowel sounds normal; no masses,  no organomegaly Extremities: extremities normal, atraumatic, no cyanosis or edema Skin: Skin color, texture, turgor normal. No rashes or lesions Neuro-psych-appropriate affect  Lab Results: Results for orders placed during the hospital encounter of 08/17/11 (from the past 48 hour(s))  GLUCOSE, CAPILLARY     Status: Abnormal   Collection Time   08/20/11  8:07 AM      Component Value Range Comment   Glucose-Capillary 124 (*) 70 - 99 (mg/dL)   GLUCOSE, CAPILLARY     Status: Abnormal   Collection Time   08/20/11 12:02 PM      Component Value Range Comment   Glucose-Capillary 140 (*) 70 - 99 (mg/dL)    Comment 1 Documented in Chart      Comment 2 Notify RN     GLUCOSE, CAPILLARY     Status: Abnormal   Collection Time   08/20/11  5:36 PM      Component Value Range Comment   Glucose-Capillary 170 (*) 70 - 99 (mg/dL)   GLUCOSE, CAPILLARY     Status: Abnormal   Collection Time   08/20/11  9:33 PM      Component Value Range Comment   Glucose-Capillary 124 (*) 70 - 99 (mg/dL)   CBC     Status: Abnormal   Collection Time   08/21/11  5:00 AM      Component Value Range Comment   WBC 6.6  4.0 - 10.5 (K/uL)    RBC 3.44 (*) 3.87 - 5.11 (MIL/uL)    Hemoglobin 10.5 (*) 12.0 - 15.0 (g/dL)    HCT 40.9 (*) 81.1 - 46.0 (%)    MCV 91.3  78.0 - 100.0 (fL)    MCH 30.5  26.0 - 34.0 (pg)    MCHC 33.4  30.0 - 36.0 (g/dL)    RDW 91.4  78.2 - 95.6 (%)    Platelets 166  150 - 400 (K/uL)   BASIC METABOLIC PANEL     Status: Abnormal   Collection Time   08/21/11  5:00 AM      Component Value Range Comment   Sodium 136  135 - 145 (mEq/L)    Potassium 4.1  3.5 - 5.1 (mEq/L)    Chloride 98  96 - 112 (mEq/L)    CO2 32  19 - 32 (mEq/L)    Glucose, Bld 125 (*) 70 - 99 (mg/dL)    BUN 28 (*) 6 - 23 (mg/dL)    Creatinine, Ser 2.13 (*) 0.50 - 1.10 (mg/dL)    Calcium 9.2  8.4 - 10.5 (mg/dL)    GFR calc non Af Amer 32 (*) >90 (mL/min)    GFR calc Af Amer 37 (*) >90 (mL/min)   PRO B NATRIURETIC PEPTIDE     Status: Abnormal   Collection Time   08/21/11  5:00 AM      Component Value Range Comment   Pro B Natriuretic peptide (BNP) 1385.0 (*) 0 - 450 (pg/mL)   GLUCOSE, CAPILLARY     Status: Abnormal   Collection Time   08/21/11  7:43 AM      Component Value Range Comment   Glucose-Capillary 139 (*) 70 - 99 (mg/dL)    Comment 1 Notify RN     GLUCOSE, CAPILLARY     Status: Abnormal   Collection Time   08/21/11 11:51 AM      Component Value Range Comment   Glucose-Capillary 121 (*) 70 - 99 (mg/dL)   GLUCOSE, CAPILLARY     Status: Abnormal   Collection Time   08/21/11  5:16 PM      Component Value Range Comment   Glucose-Capillary 200 (*) 70 - 99 (mg/dL)    Comment 1 Notify RN     URINALYSIS, ROUTINE W REFLEX MICROSCOPIC     Status: Abnormal   Collection Time   08/21/11  6:54 PM      Component Value Range Comment   Color, Urine YELLOW  YELLOW     APPearance CLOUDY (*) CLEAR     Specific Gravity, Urine 1.015  1.005 - 1.030     pH 5.0   5.0 - 8.0     Glucose, UA NEGATIVE  NEGATIVE (mg/dL)    Hgb urine dipstick NEGATIVE  NEGATIVE     Bilirubin Urine NEGATIVE  NEGATIVE     Ketones, ur NEGATIVE  NEGATIVE (mg/dL)    Protein, ur NEGATIVE  NEGATIVE (mg/dL)    Urobilinogen, UA 0.2  0.0 - 1.0 (mg/dL)    Nitrite NEGATIVE  NEGATIVE     Leukocytes, UA NEGATIVE  NEGATIVE  MICROSCOPIC NOT DONE ON URINES WITH NEGATIVE PROTEIN, BLOOD, LEUKOCYTES, NITRITE, OR GLUCOSE <1000 mg/dL.  BLOOD GAS, ARTERIAL     Status: Abnormal   Collection Time   08/21/11  7:35 PM      Component Value Range Comment   FIO2 .21      pH, Arterial 7.417 (*) 7.350 - 7.400     pCO2 arterial 44.8  35.0 - 45.0 (mmHg)    pO2, Arterial 67.8 (*) 80.0 - 100.0 (mmHg)    Bicarbonate 28.3 (*) 20.0 - 24.0 (mEq/L)    TCO2 29.7  0 - 100 (mmol/L)    Acid-Base Excess 4.0 (*) 0.0 - 2.0 (mmol/L)    O2 Saturation 94.5      Patient temperature 98.6      Collection site LEFT RADIAL      Drawn by [id number]      Sample type ARTERIAL DRAW      Allens test (pass/fail) PASS  PASS    GLUCOSE, CAPILLARY     Status: Abnormal   Collection Time   08/21/11  9:41 PM      Component Value Range Comment   Glucose-Capillary 173 (*) 70 - 99 (mg/dL)    Comment 1 Notify RN      Comment 2 Documented in Chart     CBC     Status: Abnormal   Collection Time   08/22/11  5:00 AM      Component Value Range Comment  WBC 5.6  4.0 - 10.5 (K/uL)    RBC 3.28 (*) 3.87 - 5.11 (MIL/uL)    Hemoglobin 10.0 (*) 12.0 - 15.0 (g/dL)    HCT 16.1 (*) 09.6 - 46.0 (%)    MCV 91.5  78.0 - 100.0 (fL)    MCH 30.5  26.0 - 34.0 (pg)    MCHC 33.3  30.0 - 36.0 (g/dL)    RDW 04.5  40.9 - 81.1 (%)    Platelets 181  150 - 400 (K/uL)     Micro Results: Recent Results (from the past 240 hour(s))  URINE CULTURE     Status: Normal   Collection Time   08/18/11 12:14 PM      Component Value Range Status Comment   Specimen Description URINE, CLEAN CATCH   Final    Special Requests NONE   Final    Setup Time  [id number]   Final    Colony Count 60,000 COLONIES/ML   Final    Culture     Final    Value: Multiple bacterial morphotypes present, none predominant. Suggest appropriate recollection if clinically indicated.   Report Status 08/19/2011 FINAL   Final   MRSA PCR SCREENING     Status: Normal   Collection Time   08/18/11  3:49 PM      Component Value Range Status Comment   MRSA by PCR NEGATIVE  NEGATIVE  Final     Studies/Results: Dg Chest 2 View  08/21/2011  *RADIOLOGY REPORT*  Clinical Data: CHF and short of breath  CHEST - 2 VIEW  Comparison: 08/17/2011  Findings: Improved aeration compared with the prior study. Pulmonary edema has resolved.  Small pleural effusions remain. Bibasilar atelectasis has resolved.  Negative for pneumonia.  IMPRESSION: Resolving congestive heart failure with small residual pleural effusions bilaterally.  Original Report Authenticated By: Camelia Phenes, M.D.   Dg Chest 2 View  08/17/2011  *RADIOLOGY REPORT*  Clinical Data: Shortness of breath  CHEST - 2 VIEW  Comparison: Portable chest x-ray of 07/14/2010 and CT angio chest of the same date  Findings: There are prominent interstitial markings with cardiomegaly most consistent with interstitial edema. An inflammatory process is difficult to exclude and follow up is recommended.  No definite effusion is seen.  The bones are osteopenic.  IMPRESSION: Cardiomegaly and probable interstitial edema.  Original Report Authenticated By: Juline Patch, M.D.   Ct Head Wo Contrast  08/18/2011  *RADIOLOGY REPORT*  Clinical Data: Status post cardiac catheterization today.  Now with headache.  CT HEAD WITHOUT CONTRAST  Technique:  Contiguous axial images were obtained from the base of the skull through the vertex without contrast.  Comparison: Maxillofacial CT scan 05/14/2011 reviewed.  Findings: Enhancement of the vascular structures is consistent with cardiac catheterization.  The brain is atrophic with chronic microvascular  ischemic change.  No evidence of acute infarction, hemorrhage, mass lesion, mass effect, midline shift or abnormal extra-axial fluid collection.  No hydrocephalus or pneumocephalus. The calvarium is intact.  IMPRESSION: No acute finding.  Atrophy and chronic microvascular ischemic change.  Original Report Authenticated By: Bernadene Bell. Maricela Curet, M.D.   Ct Angio Chest W/cm &/or Wo Cm  08/17/2011  *RADIOLOGY REPORT*  Clinical Data:  Shortness of breath, chest pain, elevated D-dimer.  CT ANGIOGRAPHY CHEST WITH CONTRAST  Technique:  Multidetector CT imaging of the chest was performed using the standard protocol during bolus administration of intravenous contrast.  Multiplanar CT image reconstructions including MIPs were obtained to evaluate the vascular anatomy.  Contrast: OMNIPAQUE IOHEXOL 350 MG/ML IV SOLN chest x-ray 08/17/2011.  Comparison:   Chest CT 07/14/2010  Findings:  There are moderate bilateral pleural effusions. Cardiomegaly.  Coronary artery and aortic calcifications.  No aortic aneurysm or dissection.  No filling defects in the pulmonary arteries to suggest pulmonary emboli.  There is mild vascular congestion.  Ground-glass opacities throughout the lungs could reflect early edema.  There is compressive atelectasis in the lower lobes bilaterally. No mediastinal, hilar, or axillary adenopathy. Visualized thyroid and chest wall soft tissues unremarkable. Imaging into the upper abdomen shows no acute findings.  Review of the MIP images confirms the above findings.  IMPRESSION: No evidence of pulmonary embolus.  Cardiomegaly.  Suspect mild interstitial edema.  Moderate bilateral effusions with compressive atelectasis in the lower lobes.  Coronary artery disease.  Original Report Authenticated By: Cyndie Chime, M.D.   Medications: Scheduled Meds:   . amLODipine  5 mg Oral Daily  . aspirin EC  81 mg Oral Daily  . carvedilol  6.25 mg Oral BID WC  . citalopram  20 mg Oral Daily  . fluticasone  2  spray Each Nare Daily  . furosemide  40 mg Oral BID  . insulin aspart  0-5 Units Subcutaneous QHS  . insulin aspart  0-9 Units Subcutaneous TID WC  . pantoprazole  40 mg Oral Daily  . potassium chloride  20 mEq Oral BID  . rosuvastatin  40 mg Oral q1800  . DISCONTD: furosemide  40 mg Intravenous Q12H   Continuous Infusions:  PRN Meds:.acetaminophen, ALPRAZolam, magnesium hydroxide, nitroGLYCERIN, sodium chloride, traMADol, zolpidem  Assessment/Plan: #1 Acute Systolic congestive heart failure-patient clinically improved.To continue present meds as per cardiology. #2 Hx of CAD/prior MI-will continue aspirin and carvedilol #3 ?Depression-patient to continue on celexa #4 DIABETES MELLITUS, TYPE II-continue insulin regimen #5 VITAMIN [name] DEFICIENCY #6 HYPERLIPIDEMIA-will continue crestor #7 HYPERTENSION-Bp fairly stable Overall,patient is clinically improved.Has appropriate affect and tolerating meds. Will sign off.    LOS: 5 days   Twain Stenseth 08/22/2011, 7:53 AM

## 2011-08-22 NOTE — Progress Notes (Signed)
Subjective: SOB   Objective: Vital signs in last 24 hours: Temp:  [97.7 F (36.5 C)-98.5 F (36.9 C)] 97.9 F (36.6 C) (12/27 0755) Pulse Rate:  [50-76] 76  (12/27 0345) Resp:  [16-18] 16  (12/27 0345) BP: (118-137)/(38-55) 137/51 mmHg (12/27 0755) SpO2:  [93 %-99 %] 99 % (12/27 0755) Weight:  [76.8 kg (169 lb 5 oz)] 169 lb 5 oz (76.8 kg) (12/27 0402) Weight change:  Last BM Date: 08/21/11 Intake/Output from previous day: -1520 12/26 0701 - 12/27 0700 In: 480 [P.O.:480] Out: 2000 [Urine:2000] Intake/Output this shift:    PE: General:NCAT Heart:RRR w/o MGR Lungs:Clear Abd: Ext:No edema   Lab Results:  Basename 08/22/11 0500 08/21/11 0500  WBC 5.6 6.6  HGB 10.0* 10.5*  HCT 30.0* 31.4*  PLT 181 166   BMET  Basename 08/21/11 0500 08/20/11 0505  NA 136 136  K 4.1 3.8  CL 98 99  CO2 32 30  GLUCOSE 125* 106*  BUN 28* 22  CREATININE 1.46* 1.32*  CALCIUM 9.2 9.3   No results found for this basename: TROPONINI:2,CK,MB:2 in the last 72 hours  Lab Results  Component Value Date   CHOL 226* 08/17/2011   HDL 72 08/17/2011   LDLCALC 136* 08/17/2011   LDLDIRECT 177.7 07/23/2011   TRIG 88 08/17/2011   CHOLHDL 3.1 08/17/2011   Lab Results  Component Value Date   HGBA1C 6.5* 08/17/2011     Lab Results  Component Value Date   TSH 1.47 07/23/2011    Hepatic Function Panel No results found for this basename: PROT,ALBUMIN,AST,ALT,ALKPHOS,BILITOT,BILIDIR,IBILI in the last 72 hours No results found for this basename: CHOL in the last 72 hours No results found for this basename: PROTIME in the last 72 hours    EKG: Orders placed during the hospital encounter of 08/17/11  . EKG 12-LEAD  . EKG 12-LEAD  . EKG 12-LEAD  . EKG 12-LEAD  . EKG    Studies/Results: Dg Chest 2 View  08/21/2011  *RADIOLOGY REPORT*  Clinical Data: CHF and short of breath  CHEST - 2 VIEW  Comparison: 08/17/2011  Findings: Improved aeration compared with the prior study. Pulmonary edema  has resolved.  Small pleural effusions remain. Bibasilar atelectasis has resolved.  Negative for pneumonia.  IMPRESSION: Resolving congestive heart failure with small residual pleural effusions bilaterally.  Original Report Authenticated By: Camelia Phenes, M.D.    Medications: I have reviewed the patient's current medications.  Assessment/Plan:Acute systolic congestive heart failure, probably mixed Systolic/diastolic with an EF of 40% at cath. Diuresing but BNP still high  CAD (coronary artery disease), Ant MI, CFX DES 9/11, no ISR or progression at cath this adm  Abnormal EKG, marked new ant TWI this adm with negative Troponins  DIABETES MELLITUS, TYPE II  HYPERLIPIDEMIA  HYPERTENSION  Cardiogenic shock, low b/p at [facility], (not seen here)  Sleep apnea, not on C-Pap at home because of "sinus issues"  VITAMIN [name] DEFICIENCY  PLAN: diuretics decreased yest. Due to rising cr.  Do not see BMP for today.  CHF improved, sm. bil pl. effusions  By cxr.   Had been using cpap until recent sinus infection. Will resume with help from resp. Therapy.   LOS: 5 days   Mary Cook,Mary Cook 08/22/2011, 8:09 AM   Agree with note written by Nada Boozer RNP  Pt appears clinically improved. No CP or SOB. Exam benign. Diuretics cut back because of increasing Cr. Plan transfer to tele. Restart CPAP, [facility]. Recheck labs and EKG. Probably home tomorrow.  Runell Gess 08/22/2011 8:32 AM

## 2011-08-23 LAB — CBC
HCT: 32.9 % — ABNORMAL LOW (ref 36.0–46.0)
MCH: 29.9 pg (ref 26.0–34.0)
MCV: 90.1 fL (ref 78.0–100.0)
Platelets: 190 10*3/uL (ref 150–400)
RBC: 3.65 MIL/uL — ABNORMAL LOW (ref 3.87–5.11)

## 2011-08-23 LAB — GLUCOSE, CAPILLARY
Glucose-Capillary: 146 mg/dL — ABNORMAL HIGH (ref 70–99)
Glucose-Capillary: 119 mg/dL — ABNORMAL HIGH (ref 70–99)

## 2011-08-23 LAB — URINALYSIS, ROUTINE W REFLEX MICROSCOPIC
Bilirubin Urine: NEGATIVE
Glucose, UA: NEGATIVE mg/dL
Hgb urine dipstick: NEGATIVE
Ketones, ur: NEGATIVE mg/dL
pH: 6 (ref 5.0–8.0)

## 2011-08-23 LAB — PRO B NATRIURETIC PEPTIDE: Pro B Natriuretic peptide (BNP): 1116 pg/mL — ABNORMAL HIGH (ref 0–450)

## 2011-08-23 LAB — BASIC METABOLIC PANEL
BUN: 32 mg/dL — ABNORMAL HIGH (ref 6–23)
CO2: 30 mEq/L (ref 19–32)
Calcium: 9.4 mg/dL (ref 8.4–10.5)
Creatinine, Ser: 1.47 mg/dL — ABNORMAL HIGH (ref 0.50–1.10)
Glucose, Bld: 142 mg/dL — ABNORMAL HIGH (ref 70–99)

## 2011-08-23 MED ORDER — ROSUVASTATIN CALCIUM 40 MG PO TABS: 40.0000 mg | ORAL_TABLET | Freq: Every day | ORAL | Status: DC

## 2011-08-23 MED ORDER — POTASSIUM CHLORIDE CRYS ER 20 MEQ PO TBCR: 20.0000 meq | EXTENDED_RELEASE_TABLET | Freq: Two times a day (BID) | ORAL | Status: DC

## 2011-08-23 MED ORDER — CARVEDILOL 6.25 MG PO TABS: 6.2500 mg | ORAL_TABLET | Freq: Two times a day (BID) | ORAL | Status: DC

## 2011-08-23 MED ORDER — ACETAMINOPHEN 325 MG PO TABS: 650.0000 mg | ORAL_TABLET | ORAL | Status: AC | PRN

## 2011-08-23 MED ORDER — AMLODIPINE BESYLATE 5 MG PO TABS: 5.0000 mg | ORAL_TABLET | Freq: Every day | ORAL | Status: DC

## 2011-08-23 MED ORDER — FUROSEMIDE 40 MG PO TABS: 40.0000 mg | ORAL_TABLET | Freq: Two times a day (BID) | ORAL | Status: DC

## 2011-08-23 NOTE — Progress Notes (Addendum)
Subjective: No chest pain, no SOB.  Objective: Vital signs in last 24 hours: Temp:  [97.8 F (36.6 C)-98.6 F (37 C)] 98.6 F (37 C) (12/28 0551) Pulse Rate:  [60-64] 60  (12/28 0551) Resp:  [18-20] 18  (12/28 0551) BP: (109-144)/(42-73) 124/42 mmHg (12/28 0551) SpO2:  [95 %-97 %] 97 % (12/28 0551) Weight:  [75.4 kg (166 lb 3.6 oz)-75.6 kg (166 lb 10.7 oz)] 166 lb 3.6 oz (75.4 kg) (12/28 0551) Weight change: -1.2 kg (-2 lb 10.3 oz) Last BM Date: 08/22/11 Intake/Output from previous day:-2170 12/27 0701 - 12/28 0700 In: 230 [P.O.:230] Out: 2370 [Urine:2370] Intake/Output this shift: Total I/O In: 103 [P.O.:100; Other:3] Out: -   General appearance: alert, cooperative, no distress and tired, fatigued Neck: no adenopathy, no carotid bruit, no JVD, supple, symmetrical, trachea midline and thyroid not enlarged, symmetric, no tenderness/mass/nodules Lungs: clear to auscultation bilaterally and non-labored, no W/R/R Heart: regular rate and rhythm, S1, S2 normal, no murmur, click, rub or gallop Abdomen: soft, non-tender; bowel sounds normal; no masses,  no organomegaly Extremities: extremities normal, atraumatic, no cyanosis or edema Skin: Skin color, texture, turgor normal. No rashes or lesions Neurologic: Grossly normal  Lab Results:  Basename 08/23/11 0608 08/22/11 0500  WBC 5.5 5.6  HGB 10.9* 10.0*  HCT 32.9* 30.0*  PLT 190 181   BMET  Basename 08/23/11 0608 08/21/11 0500  NA 136 136  K 4.5 4.1  CL 97 98  CO2 30 32  GLUCOSE 142* 125*  BUN 32* 28*  CREATININE 1.47* 1.46*  CALCIUM 9.4 9.2   No results found for this basename: TROPONINI:2,CK,MB:2 in the last 72 hours  Lab Results  Component Value Date   CHOL 226* 08/17/2011   HDL 72 08/17/2011   LDLCALC 136* 08/17/2011   LDLDIRECT 177.7 07/23/2011   TRIG 88 08/17/2011   CHOLHDL 3.1 08/17/2011   Lab Results  Component Value Date   HGBA1C 6.5* 08/17/2011     Lab Results  Component Value Date   TSH 1.47  07/23/2011   Studies/Results: No results found.  Medications: I have reviewed the patient's current medications.  Assessment/Plan: Patient Active Problem List  Diagnoses  . DIABETES MELLITUS, TYPE II  . VITAMIN [name] DEFICIENCY  . HYPERLIPIDEMIA  . HYPERTENSION  . DIVERTICULOSIS, COLON  . OSTEOARTHRITIS  . CRAMPS,LEG  . OSTEOPENIA  . FATIGUE  . PARESTHESIA  . COUGH  . CHEST PAIN  . VOMITING  . DIVERTICULITIS, HX OF  . SINUSITIS, MAXILLARY, CHRONIC  . HEADACHE  . Well adult exam  . Acute systolic congestive heart failure, probably mixed systolic/diastolic, EF 40% at cath  . Cardiogenic shock, low b/p at [facility], not seen here  . CAD (coronary artery disease), Ant MI, CFX DES 9/11, no ISR or progression at cath this adm  . Sleep apnea, cpap had been stopped secondary to "sinus issues"  . Abnormal EKG, marked new ant TWI this adm with negative Troponins   PLAN: ? Need for psychiatric consult for her depression.  Recommended by Dr. Waymon Amato on the 26th.  But if she is better we may be able to have case manager to arrange.   LOS: 6 days   INGOLD,LAURA R 08/23/2011, 12:28 PM   I have seen and examined the patient along with Nada Boozer, NP.  I have reviewed the chart, notes and new data.  I agree with Laura's note.  Key new complaints: Just feels a bit tired & SOB with walking, but much improved. NO CP (  except for ~1 min episode last PM that occurred while adjusting her position in bed - MSK pain reproducible on exam) Key examination changes: No significant change or abnormality.  Agree with Laura's exam. Key new findings / data: Cr stable @ ~1.47  PLAN: From a cardiac perspective - ok to go d/c home today.  F/u with Dr. Wonda Cheng Anticipate that some of her symptoms & fatigue are related to Anxiety +/- Depression.  -- recommend OP eval by PMD, may require increased dose of Celexa. Continue current CAD meds & PO Lasix  - now BID.  ASA, BB -- hold ACE-I until BMET check next week.   CCB DM- po meds @ home CPAP @ home on d/c.   Marykay Lex, M.D., M.S. [facility] [address] [place], [place]  [address]  [phone]  08/23/2011 2:41 PM

## 2011-08-28 ENCOUNTER — Encounter (HOSPITAL_COMMUNITY): Payer: Self-pay | Admitting: Cardiology

## 2011-08-28 DIAGNOSIS — R0602 Shortness of breath: Secondary | ICD-10-CM | POA: Diagnosis present

## 2011-08-28 DIAGNOSIS — I959 Hypotension, unspecified: Secondary | ICD-10-CM | POA: Diagnosis not present

## 2011-08-28 NOTE — Discharge Summary (Signed)
Physician Discharge Summary  Patient ID: Mary Cook MRN: [id number] DOB/AGE: 1926/03/06 78 y.o.  Admit date: 08/17/2011 Discharge date: 08/23/11  Discharge Diagnoses:  Principal Problem:  *CHEST PAIN Active Problems:  Abnormal EKG, marked new ant TWI this adm with negative Troponins,08/18/11  SOB (shortness of breath)  DIABETES MELLITUS, TYPE II  VITAMIN [name] DEFICIENCY  HYPERLIPIDEMIA  HYPERTENSION  Acute systolic congestive heart failure, probably mixed systolic/diastolic, EF 40% at cath  Cardiogenic shock, low b/p at [facility], not seen here  CAD (coronary artery disease), Ant MI, CFX DES 9/11, no ISR or progression at cath this adm  Sleep apnea, cpap had been stopped secondary to "sinus issues"   Discharged Condition: good  Hospital Course: 78 year old female and a history of large anterior wall myocardial infarction 05/10/2010 with drug eluding stent to the circumflex,and ejection fraction of 45% person and to [facility] on December 22 with dyspnea and chest pain for 3 days. She was initially treated with Lasix and discharged after she improved. She came back to the emergency room with recurrent chest shortness of breath she was admitted to [facility] and the initial EKG revealed nonspecific changes. Her troponins were negative x2. On 08/18/2011 she developed abnormal EKG with significant T wave inversion in anteroseptal and lateral leads she also became hypotensive and was transferred to [facility] for urgent evaluation. Initially she was to go directly to the Cath Lab but there was another STEMI in the lab, she was treated medically until the cath lab was available. Prior to the cath her blood pressure was stable.  She underwent urgent left heart cath and was found to have patent stents in the left circumflex with only mild restenosis. No significant obstructive disease in the LAD. The stenosis in the first diagonal is unchanged from his recent cardiac catheterization. The RCA has moderate diffuse  disease overall and no culprit lesion is identified for unstable angina. Mildly reduced only systolic function with an EF of 40% and mildly elevated left ventricular end-diastolic pressure.  Chin residual congestive heart failure with bibasilar crackles diuretics were continued blood pressure was stable and her beta blocker was reduced.  Continued monitoring closely for the next 2 days and etiology of diffuse anterior lateral T-wave inversion was still unclear.  She continued with rising creatinine. Diuretics were reduced on December 26.  Additionally patient has a history of obstructive sleep apnea unfortunately with a recent sinus infection she stopped using his CPAP machine and [facility]  We had a respiratory therapy supply with CPAP during hospitalization.   Creatinine increased at 1.47 from 1.04 on admission By 08/23/2011 patient was stable ambulating in the hall in no acute complaints. She was seen by Dr. Bryan Lemma and felt ready for discharge she'll follow up as an outpatient.  Weight on admission 77.1 kg increased to 79.7 kg at discharge 75.4 kg.  It was recommended she should be on 2 g sodium diet diabetic diet, she should wear her CPAP and contact Dr. Tresa Endo and Dr. Posey Rea  concerning obtaining her machine. And she does have significant depression she was recommended to follow up with her primary care for this.           Consults: cardiology  STEMI MD for acute abnormal EKG  Significant Diagnostic Studies:  Labs at discharge sodium 136 potassium 4.5 chloride 97 CO2 30 BUN 32 creatinine 1.47 which was up from 1.04 on admission calcium 9.4 glucose 142 vitamin [name] was 741 BNP 1116  Down from 1895 on 08/20/11  Hemoglobin 10 hematocrit 30.5 WBC 6.3 platelets 161  D-dimer 1.40 Hemoglobin A1c 6.5, magnesium 1.7 Cardiac enzymes are negative with troponin 0.30-less than 0.30 CK 94-115, MB 3.6-2.5  CT angiogram of the chest: No  evidence of pulmonary embolus.  Cardiomegaly. Suspect mild interstitial edema.  Moderate bilateral effusions with compressive atelectasis in the  lower lobes.  Coronary artery disease     Two-view chest x-ray:Cardiomegaly and probable interstitial edema  Discharge Exam: Blood pressure 110/62, pulse 68, temperature 98.7 F (37.1 C), temperature source Oral, resp. rate 18, height 5' (1.524 m), weight 75.4 kg (166 lb 3.6 oz), SpO2 93.00%.   General appearance: alert, cooperative, no distress and tired, fatigued  Neck: no adenopathy, no carotid bruit, no JVD, supple, symmetrical, trachea midline and thyroid not enlarged, symmetric, no tenderness/mass/nodules  Lungs: clear to auscultation bilaterally and non-labored, no W/R/R  Heart: regular rate and rhythm, S1, S2 normal, no murmur, click, rub or gallop  Abdomen: soft, non-tender; bowel sounds normal; no masses, no organomegaly  Extremities: extremities normal, atraumatic, no cyanosis or edema  Skin: Skin color, texture, turgor normal. No rashes or lesions  Neurologic: Grossly normal     Disposition: Home or Self Care  Discharge Orders    Future Appointments: Provider: Department: Dept Phone: Center:   09/05/2011 10:45 AM Sonda Primes, MD [facility] [phone] [facility]   10/21/2011 1:45 PM Sonda Primes, MD [facility] [phone] [facility]     Discharge Medication List as of 08/23/2011  5:10 PM    START taking these medications   Details  acetaminophen (TYLENOL) 325 MG tablet Take 2 tablets (650 mg total) by mouth every 4 (four) hours as needed., Starting 08/23/2011, Until Mon 09/02/11, No Print    amLODipine (NORVASC) 5 MG tablet Take 1 tablet (5 mg total) by mouth daily., Starting 08/23/2011, Until Sat 08/22/12, Normal    potassium chloride SA (K-DUR,KLOR-CON) 20 MEQ tablet Take 1 tablet (20 mEq total) by mouth 2 (two) times daily., Starting 08/23/2011, Until Sat 08/22/12, Normal    rosuvastatin (CRESTOR) 40 MG tablet Take 1 tablet (40  mg total) by mouth daily at 6 PM., Starting 08/23/2011, Until Sat 08/22/12, Normal      CONTINUE these medications which have CHANGED   Details  carvedilol (COREG) 6.25 MG tablet Take 1 tablet (6.25 mg total) by mouth 2 (two) times daily with a meal., Starting 08/23/2011, Until Sat 08/22/12, Normal    furosemide (LASIX) 40 MG tablet Take 1 tablet (40 mg total) by mouth 2 (two) times daily., Starting 08/23/2011, Until Sat 08/22/12, Normal      CONTINUE these medications which have NOT CHANGED   Details  aspirin 81 MG EC tablet Take 81 mg by mouth daily.  , Until Discontinued, Historical Med    Cyanocobalamin 3000 MCG SUBL Place 1 tablet under the tongue daily.  , Until Discontinued, Historical Med    fluticasone (FLONASE) 50 MCG/ACT nasal spray Place 2 sprays into the nose daily., Starting 07/01/2011, Until Discontinued, Normal    metFORMIN (GLUCOPHAGE) 500 MG tablet Take 1 tablet (500 mg total) by mouth 2 (two) times daily with a meal., Starting 07/09/2011, Until Wed 07/08/12, Normal    pantoprazole (PROTONIX) 40 MG tablet Take 1 tablet (40 mg total) by mouth daily., Starting 07/01/2011, Until Discontinued, Normal    traMADol (ULTRAM) 50 MG tablet Take 1-2 tablets (50-100 mg total) by mouth 2 (two) times daily as needed for pain. 1-2 tabs bid prn for pain, Starting 07/01/2011, Until  Discontinued, Normal    ACCU-CHEK SOFTCLIX LANCETS lancets by Other route daily as needed. Use as instructed, Historical Med    citalopram (CELEXA) 20 MG tablet Take 1 tablet (20 mg total) by mouth daily., Starting 07/01/2011, Until Discontinued, Normal    glucose blood (ACCU-CHEK AVIVA) test strip 1 each by Other route as needed. Use as instructed , Historical Med    nitroGLYCERIN (NITROSTAT) 0.4 MG SL tablet Place 1 tablet (0.4 mg total) under the tongue every 5 (five) minutes as needed., Starting 07/01/2011, Until Discontinued, Normal      STOP taking these medications     ramipril (ALTACE) 10 MG capsule          Follow-up Information    Make an appointment with Sonda Primes, MD. (in next one to two weeks)    Contact information:   [address] [facility] [address] [place] [address] [place] [address] [phone]       Follow up with Lennette Bihari, MD on 09/02/2011. ( at 11:45  In [facility]  need to discuss cpap with Dr. Tresa Endo)    Contact information:   [address] [place] [address] [phone]          Signed: Leone Brand 08/28/2011, 4:44 PM  I saw the patient on the day of discharge.  She was stable for discharge.  I agree with this summary of her hospitalization.  Marykay Lex, M.D., M.S. [facility] [address] [place], [place]  [address]  [phone]  08/28/2011 11:24 PM

## 2011-09-05 ENCOUNTER — Ambulatory Visit (INDEPENDENT_AMBULATORY_CARE_PROVIDER_SITE_OTHER): Payer: Medicare Other | Admitting: Internal Medicine

## 2011-09-05 ENCOUNTER — Encounter: Payer: Self-pay | Admitting: Internal Medicine

## 2011-09-05 VITALS — BP 140/70 | HR 76 | Temp 97.1°F | Resp 16 | Wt 168.0 lb

## 2011-09-05 DIAGNOSIS — I10 Essential (primary) hypertension: Secondary | ICD-10-CM

## 2011-09-05 DIAGNOSIS — E538 Deficiency of other specified B group vitamins: Secondary | ICD-10-CM

## 2011-09-05 DIAGNOSIS — I509 Heart failure, unspecified: Secondary | ICD-10-CM

## 2011-09-05 DIAGNOSIS — I2510 Atherosclerotic heart disease of native coronary artery without angina pectoris: Secondary | ICD-10-CM

## 2011-09-05 DIAGNOSIS — E559 Vitamin D deficiency, unspecified: Secondary | ICD-10-CM

## 2011-09-05 MED ORDER — CLONAZEPAM 0.5 MG PO TBDP: 0.5000 mg | ORAL_TABLET | Freq: Two times a day (BID) | ORAL | Status: DC | PRN

## 2011-09-05 MED ORDER — CYANOCOBALAMIN 1000 MCG/ML IJ SOLN
1000.0000 ug | Freq: Once | INTRAMUSCULAR | Status: AC
  Administered 2011-09-05: 1000 ug via INTRAMUSCULAR

## 2011-09-05 MED ORDER — VITAMIN D 1000 UNITS PO TABS: 1000.0000 [IU] | ORAL_TABLET | Freq: Every day | ORAL | Status: DC

## 2011-09-05 MED ORDER — VITAMIN B-12 500 MCG SL SUBL: 1.0000 | SUBLINGUAL_TABLET | SUBLINGUAL | Status: DC

## 2011-09-05 NOTE — Assessment & Plan Note (Signed)
Continue with current prescription therapy as reflected on the Med list.  

## 2011-09-05 NOTE — Assessment & Plan Note (Signed)
1/13 EF 40% Wt Readings from Last 3 Encounters:  09/05/11 168 lb (76.204 kg)  08/23/11 166 lb 3.6 oz (75.4 kg)  08/23/11 166 lb 3.6 oz (75.4 kg)   Continue with current prescription therapy as reflected on the Med list.

## 2011-09-05 NOTE — Progress Notes (Signed)
Subjective:    Patient ID: Mary Cook, female    DOB: 1925/08/28, 78 y.o.   MRN: [id number]  HPI  F/u hosp stay  Admit date: 08/17/2011  Discharge date: 08/23/11  Discharge Diagnoses:  Principal Problem:  *CHEST PAIN  Active Problems:  Abnormal EKG, marked new ant TWI this adm with negative Troponins,08/18/11  SOB (shortness of breath)  DIABETES MELLITUS, TYPE II  VITAMIN [name] DEFICIENCY  HYPERLIPIDEMIA  HYPERTENSION  Acute systolic congestive heart failure, probably mixed systolic/diastolic, EF 40% at cath  Cardiogenic shock, low b/p at [facility], not seen here  CAD (coronary artery disease), Ant MI, CFX DES 9/11, no ISR or progression at cath this adm  Sleep apnea, cpap had been stopped secondary to "sinus issues"  Discharged Condition: good  Hospital Course: 78 year old female and a history of large anterior wall myocardial infarction 05/10/2010 with drug eluding stent to the circumflex,and ejection fraction of 45% person and to [facility] on December 22 with dyspnea and chest pain for 3 days. She was initially treated with Lasix and discharged after she improved. She came back to the emergency room with recurrent chest shortness of breath she was admitted to [facility] and the initial EKG revealed nonspecific changes. Her troponins were negative x2. On 08/18/2011 she developed abnormal EKG with significant T wave inversion in anteroseptal and lateral leads she also became hypotensive and was transferred to [facility] for urgent evaluation. Initially she was to go directly to the Cath Lab but there was another STEMI in the lab, she was treated medically until the cath lab was available. Prior to the cath her blood pressure was stable.  She underwent urgent left heart cath and was found to have patent stents in the left circumflex with only mild restenosis. No significant obstructive disease in the LAD. The stenosis in the first diagonal is unchanged from his recent cardiac catheterization. The RCA has  moderate diffuse disease overall and no culprit lesion is identified for unstable angina. Mildly reduced only systolic function with an EF of 40% and mildly elevated left ventricular end-diastolic pressure.  Chin residual congestive heart failure with bibasilar crackles diuretics were continued blood pressure was stable and her beta blocker was reduced. Continued monitoring closely for the next 2 days and etiology of diffuse anterior lateral T-wave inversion was still unclear. She continued with rising creatinine. Diuretics were reduced on December 26.  Additionally patient has a history of obstructive sleep apnea unfortunately with a recent sinus infection she stopped using his CPAP machine and actually [facility]. We had a respiratory therapy supply with CPAP during hospitalization.  Creatinine increased at 1.47 from 1.04 on admission  By 08/23/2011 patient was stable ambulating in the hall in no acute complaints. She was seen by Dr. Bryan Lemma and felt ready for discharge she'll follow up as an outpatient.  Weight on admission 77.1 kg increased to 79.7 kg at discharge 75.4 kg.  It was recommended she should be on 2 g sodium diet diabetic diet, she should wear her CPAP and contact Dr. Tresa Endo and Dr. Posey Rea concerning obtaining her machine. And she does have significant depression she was recommended to follow up with her primary care for this.   Review of Systems  Constitutional: Positive for fatigue. Negative for fever, chills, activity change, appetite change and unexpected weight change.  HENT: Negative for congestion, mouth sores and sinus pressure.   Eyes: Negative for visual disturbance.  Respiratory: Positive for shortness of breath (mild).  Negative for cough and chest tightness.   Cardiovascular: Negative for palpitations and leg swelling.  Gastrointestinal: Negative for nausea, vomiting and abdominal pain.  Genitourinary: Negative for frequency,  difficulty urinating and vaginal pain.  Musculoskeletal: Negative for myalgias, back pain and gait problem.  Skin: Negative for pallor and rash.  Neurological: Negative for dizziness, tremors, weakness, numbness and headaches.  Psychiatric/Behavioral: Negative for confusion and sleep disturbance. The patient is not hyperactive.        Objective:   Physical Exam  Constitutional: She appears well-developed. No distress.       NAD Obese  HENT:  Head: Normocephalic.  Right Ear: External ear normal.  Left Ear: External ear normal.  Nose: Nose normal.  Mouth/Throat: Oropharynx is clear and moist.  Eyes: Conjunctivae are normal. Pupils are equal, round, and reactive to light. Right eye exhibits no discharge. Left eye exhibits no discharge.  Neck: Normal range of motion. Neck supple. No JVD present. No tracheal deviation present. No thyromegaly present.  Cardiovascular: Normal rate, regular rhythm and normal heart sounds.   Pulmonary/Chest: No stridor. No respiratory distress. She has no wheezes.  Abdominal: Soft. Bowel sounds are normal. She exhibits no distension and no mass. There is no tenderness. There is no rebound and no guarding.  Musculoskeletal: She exhibits no edema and no tenderness.  Lymphadenopathy:    She has no cervical adenopathy.  Neurological: She displays normal reflexes. No cranial nerve deficit. She exhibits normal muscle tone. Coordination normal.  Skin: No rash noted. No erythema.  Psychiatric: She has a normal mood and affect. Her behavior is normal. Judgment and thought content normal.   Lab Results  Component Value Date   WBC 5.5 08/23/2011   HGB 10.9* 08/23/2011   HCT 32.9* 08/23/2011   PLT 190 08/23/2011   GLUCOSE 142* 08/23/2011   CHOL 226* 08/17/2011   TRIG 88 08/17/2011   HDL 72 08/17/2011   LDLDIRECT 177.7 07/23/2011   LDLCALC 136* 08/17/2011   ALT 45* 08/18/2011   AST 27 08/18/2011   NA 136 08/23/2011   K 4.5 08/23/2011   CL 97 08/23/2011    CREATININE 1.47* 08/23/2011   BUN 32* 08/23/2011   CO2 30 08/23/2011   TSH 1.47 07/23/2011   INR 0.96 07/14/2010   HGBA1C 6.5* 08/17/2011           Assessment & Plan:   A complex case. Hospital records, Xrays, labs were reviewed.

## 2011-09-05 NOTE — Assessment & Plan Note (Signed)
Re-start [name] 

## 2011-09-09 ENCOUNTER — Other Ambulatory Visit: Payer: Self-pay | Admitting: *Deleted

## 2011-09-09 MED ORDER — PANTOPRAZOLE SODIUM 40 MG PO TBEC: 40.0000 mg | DELAYED_RELEASE_TABLET | Freq: Every day | ORAL | Status: DC

## 2011-09-09 MED ORDER — METFORMIN HCL 500 MG PO TABS: 500.0000 mg | ORAL_TABLET | Freq: Two times a day (BID) | ORAL | Status: DC

## 2011-09-09 MED ORDER — CARVEDILOL 6.25 MG PO TABS: 6.2500 mg | ORAL_TABLET | Freq: Two times a day (BID) | ORAL | Status: DC

## 2011-09-09 MED ORDER — FLUTICASONE PROPIONATE 50 MCG/ACT NA SUSP: 2.0000 | Freq: Every day | NASAL | Status: DC

## 2011-09-09 MED ORDER — AMLODIPINE BESYLATE 5 MG PO TABS: 5.0000 mg | ORAL_TABLET | Freq: Every day | ORAL | Status: DC

## 2011-09-09 MED ORDER — FUROSEMIDE 40 MG PO TABS: 40.0000 mg | ORAL_TABLET | Freq: Two times a day (BID) | ORAL | Status: DC

## 2011-09-09 MED ORDER — VITAMIN D 1000 UNITS PO TABS: 1000.0000 [IU] | ORAL_TABLET | Freq: Every day | ORAL | Status: DC

## 2011-09-09 MED ORDER — VITAMIN B-12 500 MCG SL SUBL: 1.0000 | SUBLINGUAL_TABLET | SUBLINGUAL | Status: DC

## 2011-09-09 MED ORDER — CITALOPRAM HYDROBROMIDE 20 MG PO TABS: 20.0000 mg | ORAL_TABLET | Freq: Every day | ORAL | Status: DC

## 2011-09-09 MED ORDER — POTASSIUM CHLORIDE CRYS ER 20 MEQ PO TBCR: 20.0000 meq | EXTENDED_RELEASE_TABLET | Freq: Two times a day (BID) | ORAL | Status: DC

## 2011-09-09 MED ORDER — NITROGLYCERIN 0.4 MG SL SUBL: 0.4000 mg | SUBLINGUAL_TABLET | SUBLINGUAL | Status: DC | PRN

## 2011-09-09 MED ORDER — ROSUVASTATIN CALCIUM 40 MG PO TABS: 40.0000 mg | ORAL_TABLET | Freq: Every day | ORAL | Status: DC

## 2011-09-09 MED ORDER — TRAMADOL HCL 50 MG PO TABS: 50.0000 mg | ORAL_TABLET | Freq: Two times a day (BID) | ORAL | Status: DC | PRN

## 2011-09-23 ENCOUNTER — Other Ambulatory Visit: Payer: Self-pay | Admitting: *Deleted

## 2011-09-23 MED ORDER — GLUCOSE BLOOD VI STRP: 1.0000 | ORAL_STRIP | Freq: Two times a day (BID) | Status: DC

## 2011-09-23 MED ORDER — ACCU-CHEK SOFTCLIX LANCETS MISC: 1.0000 | Freq: Two times a day (BID) | Status: DC

## 2011-10-21 ENCOUNTER — Ambulatory Visit: Payer: Medicare PPO | Admitting: Internal Medicine

## 2011-12-09 ENCOUNTER — Encounter: Payer: Self-pay | Admitting: Internal Medicine

## 2011-12-09 ENCOUNTER — Telehealth: Payer: Self-pay | Admitting: Internal Medicine

## 2011-12-09 ENCOUNTER — Ambulatory Visit (INDEPENDENT_AMBULATORY_CARE_PROVIDER_SITE_OTHER): Payer: Medicare Other | Admitting: Internal Medicine

## 2011-12-09 ENCOUNTER — Other Ambulatory Visit (INDEPENDENT_AMBULATORY_CARE_PROVIDER_SITE_OTHER): Payer: Medicare Other

## 2011-12-09 VITALS — BP 158/78 | HR 80 | Temp 98.2°F | Resp 16 | Wt 163.0 lb

## 2011-12-09 DIAGNOSIS — I2510 Atherosclerotic heart disease of native coronary artery without angina pectoris: Secondary | ICD-10-CM

## 2011-12-09 DIAGNOSIS — E119 Type 2 diabetes mellitus without complications: Secondary | ICD-10-CM

## 2011-12-09 DIAGNOSIS — E538 Deficiency of other specified B group vitamins: Secondary | ICD-10-CM

## 2011-12-09 DIAGNOSIS — I10 Essential (primary) hypertension: Secondary | ICD-10-CM

## 2011-12-09 DIAGNOSIS — R0602 Shortness of breath: Secondary | ICD-10-CM

## 2011-12-09 DIAGNOSIS — I509 Heart failure, unspecified: Secondary | ICD-10-CM

## 2011-12-09 DIAGNOSIS — E785 Hyperlipidemia, unspecified: Secondary | ICD-10-CM

## 2011-12-09 DIAGNOSIS — I2589 Other forms of chronic ischemic heart disease: Secondary | ICD-10-CM

## 2011-12-09 DIAGNOSIS — R5383 Other fatigue: Secondary | ICD-10-CM

## 2011-12-09 DIAGNOSIS — R5381 Other malaise: Secondary | ICD-10-CM

## 2011-12-09 LAB — CBC WITH DIFFERENTIAL/PLATELET
Basophils Relative: 0.9 % (ref 0.0–3.0)
Eosinophils Relative: 2.7 % (ref 0.0–5.0)
Hemoglobin: 11.6 g/dL — ABNORMAL LOW (ref 12.0–15.0)
Lymphocytes Relative: 28.1 % (ref 12.0–46.0)
Monocytes Relative: 8.7 % (ref 3.0–12.0)
Neutro Abs: 2.9 10*3/uL (ref 1.4–7.7)
RBC: 3.76 Mil/uL — ABNORMAL LOW (ref 3.87–5.11)

## 2011-12-09 LAB — BASIC METABOLIC PANEL
BUN: 19 mg/dL (ref 6–23)
CO2: 31 mEq/L (ref 19–32)
Chloride: 102 mEq/L (ref 96–112)
Creatinine, Ser: 1.3 mg/dL — ABNORMAL HIGH (ref 0.4–1.2)

## 2011-12-09 LAB — HEPATIC FUNCTION PANEL
ALT: 13 U/L (ref 0–35)
Bilirubin, Direct: 0.1 mg/dL (ref 0.0–0.3)
Total Protein: 7.1 g/dL (ref 6.0–8.3)

## 2011-12-09 LAB — VITAMIN B12: Vitamin B-12: 1264 pg/mL — ABNORMAL HIGH (ref 211–911)

## 2011-12-09 LAB — LIPID PANEL: VLDL: 17 mg/dL (ref 0.0–40.0)

## 2011-12-09 LAB — BRAIN NATRIURETIC PEPTIDE: Pro B Natriuretic peptide (BNP): 199 pg/mL — ABNORMAL HIGH (ref 0.0–100.0)

## 2011-12-09 LAB — HEMOGLOBIN A1C: Hgb A1c MFr Bld: 6 % (ref 4.6–6.5)

## 2011-12-09 MED ORDER — PANTOPRAZOLE SODIUM 40 MG PO TBEC: 40.0000 mg | DELAYED_RELEASE_TABLET | Freq: Every day | ORAL | Status: DC

## 2011-12-09 MED ORDER — TRAMADOL HCL 50 MG PO TABS: 50.0000 mg | ORAL_TABLET | Freq: Two times a day (BID) | ORAL | Status: DC | PRN

## 2011-12-09 MED ORDER — GLUCOSE BLOOD VI STRP: ORAL_STRIP | Status: AC

## 2011-12-09 MED ORDER — AMLODIPINE BESYLATE 5 MG PO TABS: 5.0000 mg | ORAL_TABLET | Freq: Every day | ORAL | Status: DC

## 2011-12-09 MED ORDER — ACCU-CHEK SOFTCLIX LANCETS MISC: 1.0000 | Freq: Two times a day (BID) | Status: DC

## 2011-12-09 MED ORDER — POTASSIUM CHLORIDE CRYS ER 20 MEQ PO TBCR: 20.0000 meq | EXTENDED_RELEASE_TABLET | Freq: Two times a day (BID) | ORAL | Status: DC

## 2011-12-09 MED ORDER — ROSUVASTATIN CALCIUM 40 MG PO TABS: 40.0000 mg | ORAL_TABLET | Freq: Every day | ORAL | Status: DC

## 2011-12-09 MED ORDER — CARVEDILOL 6.25 MG PO TABS: 6.2500 mg | ORAL_TABLET | Freq: Two times a day (BID) | ORAL | Status: DC

## 2011-12-09 MED ORDER — CLONAZEPAM 0.5 MG PO TBDP: 0.5000 mg | ORAL_TABLET | Freq: Two times a day (BID) | ORAL | Status: DC | PRN

## 2011-12-09 MED ORDER — CITALOPRAM HYDROBROMIDE 20 MG PO TABS: 20.0000 mg | ORAL_TABLET | Freq: Every day | ORAL | Status: DC

## 2011-12-09 MED ORDER — METFORMIN HCL 500 MG PO TABS: 500.0000 mg | ORAL_TABLET | Freq: Two times a day (BID) | ORAL | Status: DC

## 2011-12-09 MED ORDER — FUROSEMIDE 40 MG PO TABS: 40.0000 mg | ORAL_TABLET | Freq: Two times a day (BID) | ORAL | Status: DC

## 2011-12-09 NOTE — Assessment & Plan Note (Signed)
Chronic Not taking [name] -- 11/12 Risks associated with treatment noncompliance were discussed. Compliance was encouraged.

## 2011-12-09 NOTE — Assessment & Plan Note (Signed)
Continue with current prescription therapy as reflected on the Med list.  

## 2011-12-09 NOTE — Assessment & Plan Note (Signed)
Chronic States BP is nl at home Continue with current prescription therapy as reflected on the Med list.  

## 2011-12-09 NOTE — Telephone Encounter (Signed)
Misty Stanley, please, inform patient that all labs are normal except for elev chol. Is she taking Crestor Thx

## 2011-12-09 NOTE — Progress Notes (Signed)
Patient ID: Mary Cook, female   DOB: Nov 02, 1925, 78 y.o.   MRN: [id number]  Subjective:    Patient ID: Mary Cook, female    DOB: [birth year], 78 y.o.   MRN: [id number]  HPI   The patient presents for a follow-up of  chronic hypertension, chronic dyslipidemia, type 2 diabetes, CAD, depression controlled with medicines. C/o occ hot flashes  Wt Readings from Last 3 Encounters:  12/09/11 163 lb (73.936 kg)  09/05/11 168 lb (76.204 kg)  08/23/11 166 lb 3.6 oz (75.4 kg)   BP Readings from Last 3 Encounters:  12/09/11 158/78  09/05/11 140/70  08/23/11 110/62         Review of Systems  Constitutional: Positive for fatigue. Negative for fever, chills, activity change, appetite change and unexpected weight change.  HENT: Negative for congestion, mouth sores and sinus pressure.   Eyes: Negative for visual disturbance.  Respiratory: Positive for shortness of breath (mild). Negative for cough and chest tightness.   Cardiovascular: Negative for palpitations and leg swelling.  Gastrointestinal: Negative for nausea, vomiting and abdominal pain.  Genitourinary: Negative for frequency, difficulty urinating and vaginal pain.  Musculoskeletal: Negative for myalgias, back pain and gait problem.  Skin: Negative for pallor and rash.  Neurological: Negative for dizziness, tremors, weakness, numbness and headaches.  Psychiatric/Behavioral: Negative for confusion and sleep disturbance. The patient is not hyperactive.        Objective:   Physical Exam  Constitutional: She appears well-developed. No distress.       NAD Obese  HENT:  Head: Normocephalic.  Right Ear: External ear normal.  Left Ear: External ear normal.  Nose: Nose normal.  Mouth/Throat: Oropharynx is clear and moist.  Eyes: Conjunctivae are normal. Pupils are equal, round, and reactive to light. Right eye exhibits no discharge. Left eye exhibits no discharge.  Neck: Normal range of motion. Neck supple. No JVD  present. No tracheal deviation present. No thyromegaly present.  Cardiovascular: Normal rate, regular rhythm and normal heart sounds.   Pulmonary/Chest: No stridor. No respiratory distress. She has no wheezes.  Abdominal: Soft. Bowel sounds are normal. She exhibits no distension and no mass. There is no tenderness. There is no rebound and no guarding.  Musculoskeletal: She exhibits no edema and no tenderness.  Lymphadenopathy:    She has no cervical adenopathy.  Neurological: She displays normal reflexes. No cranial nerve deficit. She exhibits normal muscle tone. Coordination normal.  Skin: No rash noted. No erythema.  Psychiatric: She has a normal mood and affect. Her behavior is normal. Judgment and thought content normal.   Lab Results  Component Value Date   WBC 5.5 08/23/2011   HGB 10.9* 08/23/2011   HCT 32.9* 08/23/2011   PLT 190 08/23/2011   GLUCOSE 142* 08/23/2011   CHOL 226* 08/17/2011   TRIG 88 08/17/2011   HDL 72 08/17/2011   LDLDIRECT 177.7 07/23/2011   LDLCALC 136* 08/17/2011   ALT 45* 08/18/2011   AST 27 08/18/2011   NA 136 08/23/2011   K 4.5 08/23/2011   CL 97 08/23/2011   CREATININE 1.47* 08/23/2011   BUN 32* 08/23/2011   CO2 30 08/23/2011   TSH 1.47 07/23/2011   INR 0.96 07/14/2010   HGBA1C 6.5* 08/17/2011           Assessment & Plan:

## 2011-12-09 NOTE — Assessment & Plan Note (Signed)
Better  

## 2011-12-10 ENCOUNTER — Encounter: Payer: Self-pay | Admitting: Internal Medicine

## 2011-12-10 LAB — VITAMIN D 25 HYDROXY (VIT D DEFICIENCY, FRACTURES): Vit D, 25-Hydroxy: 33 ng/mL (ref 30–89)

## 2011-12-10 NOTE — Telephone Encounter (Signed)
Pt informed. She states her ins wasn't paying for Crestor so she has not been taking it.

## 2011-12-10 NOTE — Telephone Encounter (Signed)
Should we switch to generic lipitor? Thx

## 2011-12-11 MED ORDER — ATORVASTATIN CALCIUM 20 MG PO TABS: 20.0000 mg | ORAL_TABLET | Freq: Every day | ORAL | Status: DC

## 2011-12-11 NOTE — Telephone Encounter (Signed)
Ok - start generic Lipitor in place of Crestor Thx

## 2011-12-11 NOTE — Telephone Encounter (Signed)
Pt stated she thought any of the generics would be sufficient.

## 2011-12-12 NOTE — Telephone Encounter (Signed)
Left detailed mess informing pt of below.  

## 2012-06-04 ENCOUNTER — Other Ambulatory Visit: Payer: Self-pay | Admitting: *Deleted

## 2012-06-04 MED ORDER — PANTOPRAZOLE SODIUM 40 MG PO TBEC: 40.0000 mg | DELAYED_RELEASE_TABLET | Freq: Every day | ORAL | Status: DC

## 2012-07-06 ENCOUNTER — Encounter: Payer: Self-pay | Admitting: Internal Medicine

## 2012-08-24 ENCOUNTER — Other Ambulatory Visit: Payer: Self-pay | Admitting: *Deleted

## 2012-08-24 MED ORDER — ATORVASTATIN CALCIUM 20 MG PO TABS: 20.0000 mg | ORAL_TABLET | Freq: Every day | ORAL | Status: DC

## 2012-08-28 ENCOUNTER — Other Ambulatory Visit: Payer: Self-pay | Admitting: *Deleted

## 2012-08-28 MED ORDER — ATORVASTATIN CALCIUM 20 MG PO TABS: 20.0000 mg | ORAL_TABLET | Freq: Every day | ORAL | Status: DC

## 2013-01-25 ENCOUNTER — Encounter: Payer: Self-pay | Admitting: Internal Medicine

## 2013-01-25 ENCOUNTER — Ambulatory Visit (INDEPENDENT_AMBULATORY_CARE_PROVIDER_SITE_OTHER): Payer: Medicare Other | Admitting: Internal Medicine

## 2013-01-25 ENCOUNTER — Other Ambulatory Visit (INDEPENDENT_AMBULATORY_CARE_PROVIDER_SITE_OTHER): Payer: Medicare Other

## 2013-01-25 VITALS — BP 130/74 | HR 59 | Temp 97.5°F | Ht 65.0 in | Wt 165.0 lb

## 2013-01-25 DIAGNOSIS — E538 Deficiency of other specified B group vitamins: Secondary | ICD-10-CM

## 2013-01-25 DIAGNOSIS — Z639 Problem related to primary support group, unspecified: Secondary | ICD-10-CM

## 2013-01-25 DIAGNOSIS — K219 Gastro-esophageal reflux disease without esophagitis: Secondary | ICD-10-CM | POA: Insufficient documentation

## 2013-01-25 DIAGNOSIS — E119 Type 2 diabetes mellitus without complications: Secondary | ICD-10-CM

## 2013-01-25 DIAGNOSIS — I10 Essential (primary) hypertension: Secondary | ICD-10-CM

## 2013-01-25 DIAGNOSIS — F439 Reaction to severe stress, unspecified: Secondary | ICD-10-CM

## 2013-01-25 DIAGNOSIS — Z0000 Encounter for general adult medical examination without abnormal findings: Secondary | ICD-10-CM

## 2013-01-25 DIAGNOSIS — Z01419 Encounter for gynecological examination (general) (routine) without abnormal findings: Secondary | ICD-10-CM

## 2013-01-25 DIAGNOSIS — E785 Hyperlipidemia, unspecified: Secondary | ICD-10-CM

## 2013-01-25 LAB — BASIC METABOLIC PANEL
CO2: 28 mEq/L (ref 19–32)
Chloride: 101 mEq/L (ref 96–112)
Creatinine, Ser: 1.2 mg/dL (ref 0.4–1.2)
Glucose, Bld: 101 mg/dL — ABNORMAL HIGH (ref 70–99)

## 2013-01-25 LAB — TSH: TSH: 1.6 u[IU]/mL (ref 0.35–5.50)

## 2013-01-25 LAB — URINALYSIS, ROUTINE W REFLEX MICROSCOPIC
Bilirubin Urine: NEGATIVE
Ketones, ur: NEGATIVE
Urobilinogen, UA: 0.2 (ref 0.0–1.0)

## 2013-01-25 LAB — CBC WITH DIFFERENTIAL/PLATELET
Basophils Relative: 0.4 % (ref 0.0–3.0)
Eosinophils Absolute: 0.3 10*3/uL (ref 0.0–0.7)
Hemoglobin: 11.6 g/dL — ABNORMAL LOW (ref 12.0–15.0)
MCHC: 34.5 g/dL (ref 30.0–36.0)
MCV: 91.1 fl (ref 78.0–100.0)
Monocytes Absolute: 0.8 10*3/uL (ref 0.1–1.0)
Neutro Abs: 4.4 10*3/uL (ref 1.4–7.7)
Neutrophils Relative %: 60.9 % (ref 43.0–77.0)
RBC: 3.71 Mil/uL — ABNORMAL LOW (ref 3.87–5.11)

## 2013-01-25 LAB — HEPATIC FUNCTION PANEL
ALT: 15 U/L (ref 0–35)
Total Protein: 7.1 g/dL (ref 6.0–8.3)

## 2013-01-25 LAB — VITAMIN B12: Vitamin B-12: 485 pg/mL (ref 211–911)

## 2013-01-25 MED ORDER — PANTOPRAZOLE SODIUM 40 MG PO TBEC: 40.0000 mg | DELAYED_RELEASE_TABLET | Freq: Every day | ORAL | Status: DC

## 2013-01-25 MED ORDER — CIPROFLOXACIN HCL 250 MG PO TABS: ORAL_TABLET | ORAL | Status: DC

## 2013-01-25 MED ORDER — FUROSEMIDE 40 MG PO TABS: 40.0000 mg | ORAL_TABLET | Freq: Two times a day (BID) | ORAL | Status: DC

## 2013-01-25 NOTE — Assessment & Plan Note (Signed)
Labs

## 2013-01-25 NOTE — Assessment & Plan Note (Addendum)
Here for medicare wellness/physical  Diet: heart healthy  Physical activity: sedentary  Depression/mood screen: negative  Hearing: intact to whispered voice  Visual acuity: grossly normal, performs annual eye exam  ADLs: capable  Fall risk: none  Home safety: good  Cognitive evaluation: intact to orientation, naming, recall and repetition  EOL planning: adv directives, full code/ I agree  I have personally reviewed and have noted  1. The patient's medical and social history  2. Their use of alcohol, tobacco or illicit drugs  3. Their current medications and supplements  4. The patient's functional ability including ADL's, fall risks, home safety risks and hearing or visual impairment.  5. Diet and physical activities - NAS, low fat 6. Evidence for depression or mood disorders: no    Today patient counseled on age appropriate routine health concerns for screening and prevention, each reviewed and up to date or declined. Immunizations reviewed and up to date or declined. Labs ordered and reviewed. Risk factors for depression reviewed and negative. Hearing function and visual acuity are intact. ADLs screened and addressed as needed. Functional ability and level of safety reviewed and appropriate. Education, counseling and referrals performed based on assessed risks today. Patient provided with a copy of personalized plan for preventive services.   EKG, vaccines

## 2013-01-25 NOTE — Assessment & Plan Note (Signed)
Omeprazole - not effective Change to Protonix

## 2013-01-25 NOTE — Assessment & Plan Note (Signed)
Continue with current prescription therapy as reflected on the Med list. labs 

## 2013-01-25 NOTE — Assessment & Plan Note (Signed)
Continue with current prescription therapy as reflected on the Med list.  

## 2013-01-25 NOTE — Progress Notes (Signed)
  Subjective:    HPI The patient is here for a wellness exam. The patient has been doing well overall without major physical or psychological issues going on lately.   The patient presents for a follow-up of  chronic hypertension, chronic dyslipidemia, type 2 diabetes, CAD, CHF, depression controlled with medicines.   F/u vit def - not sure if she is taking them right C/o stress -- brother in law just died   Wt Readings from Last 3 Encounters:  01/25/13 165 lb (74.844 kg)  12/09/11 163 lb (73.936 kg)  09/05/11 168 lb (76.204 kg)   BP Readings from Last 3 Encounters:  01/25/13 130/74  12/09/11 158/78  09/05/11 140/70         Review of Systems  Constitutional: Positive for fatigue. Negative for fever, chills, activity change, appetite change and unexpected weight change.  HENT: Negative for congestion, mouth sores and sinus pressure.   Eyes: Negative for visual disturbance.  Respiratory: Positive for shortness of breath (mild). Negative for cough and chest tightness.   Cardiovascular: Negative for palpitations and leg swelling.  Gastrointestinal: Negative for nausea, vomiting and abdominal pain.  Genitourinary: Negative for frequency, difficulty urinating and vaginal pain.  Musculoskeletal: Negative for myalgias, back pain and gait problem.  Skin: Negative for pallor and rash.  Neurological: Negative for dizziness, tremors, weakness, numbness and headaches.  Psychiatric/Behavioral: Negative for confusion and sleep disturbance. The patient is not hyperactive.        Objective:   Physical Exam  Constitutional: She appears well-developed. No distress.  NAD Obese  HENT:  Head: Normocephalic.  Right Ear: External ear normal.  Left Ear: External ear normal.  Nose: Nose normal.  Mouth/Throat: Oropharynx is clear and moist.  Eyes: Conjunctivae are normal. Pupils are equal, round, and reactive to light. Right eye exhibits no discharge. Left eye exhibits no discharge.   Neck: Normal range of motion. Neck supple. No JVD present. No tracheal deviation present. No thyromegaly present.  Cardiovascular: Normal rate, regular rhythm and normal heart sounds.   Pulmonary/Chest: No stridor. No respiratory distress. She has no wheezes.  Abdominal: Soft. Bowel sounds are normal. She exhibits no distension and no mass. There is no tenderness. There is no rebound and no guarding.  Musculoskeletal: She exhibits no edema and no tenderness.  Lymphadenopathy:    She has no cervical adenopathy.  Neurological: She displays normal reflexes. No cranial nerve deficit. She exhibits normal muscle tone. Coordination normal.  Skin: No rash noted. No erythema.  Psychiatric: She has a normal mood and affect. Her behavior is normal. Judgment and thought content normal.   Lab Results  Component Value Date   WBC 4.9 12/09/2011   HGB 11.6* 12/09/2011   HCT 34.6* 12/09/2011   PLT 193.0 12/09/2011   GLUCOSE 122* 12/09/2011   CHOL 280* 12/09/2011   TRIG 85.0 12/09/2011   HDL 74.90 12/09/2011   LDLDIRECT 180.7 12/09/2011   LDLCALC 136* 08/17/2011   ALT 13 12/09/2011   AST 16 12/09/2011   NA 142 12/09/2011   K 3.9 12/09/2011   CL 102 12/09/2011   CREATININE 1.3* 12/09/2011   BUN 19 12/09/2011   CO2 31 12/09/2011   TSH 1.25 12/09/2011   INR 0.96 07/14/2010   HGBA1C 6.0 12/09/2011           Assessment & Plan:

## 2013-01-26 ENCOUNTER — Other Ambulatory Visit: Payer: Self-pay | Admitting: *Deleted

## 2013-01-26 DIAGNOSIS — F439 Reaction to severe stress, unspecified: Secondary | ICD-10-CM | POA: Insufficient documentation

## 2013-01-26 MED ORDER — CIPROFLOXACIN HCL 250 MG PO TABS: ORAL_TABLET | ORAL | Status: DC

## 2013-01-26 NOTE — Assessment & Plan Note (Signed)
5/14 grief - brother in law just died Discussed

## 2013-03-31 ENCOUNTER — Other Ambulatory Visit: Payer: Self-pay | Admitting: *Deleted

## 2013-03-31 MED ORDER — RAMIPRIL 10 MG PO CAPS: 10.0000 mg | ORAL_CAPSULE | Freq: Every day | ORAL | Status: DC

## 2013-03-31 MED ORDER — METFORMIN HCL 500 MG PO TABS: 500.0000 mg | ORAL_TABLET | Freq: Two times a day (BID) | ORAL | Status: DC

## 2013-03-31 MED ORDER — ACCU-CHEK SOFTCLIX LANCETS MISC: 1.0000 | Freq: Two times a day (BID) | Status: DC

## 2013-03-31 MED ORDER — AMLODIPINE BESYLATE 5 MG PO TABS: 5.0000 mg | ORAL_TABLET | Freq: Every day | ORAL | Status: DC

## 2013-03-31 MED ORDER — CARVEDILOL 6.25 MG PO TABS: 6.2500 mg | ORAL_TABLET | Freq: Two times a day (BID) | ORAL | Status: DC

## 2013-04-13 ENCOUNTER — Ambulatory Visit (INDEPENDENT_AMBULATORY_CARE_PROVIDER_SITE_OTHER): Payer: Medicare Other | Admitting: Internal Medicine

## 2013-04-13 ENCOUNTER — Encounter: Payer: Self-pay | Admitting: Internal Medicine

## 2013-04-13 VITALS — BP 190/100 | HR 72 | Temp 98.0°F | Resp 16 | Wt 166.0 lb

## 2013-04-13 DIAGNOSIS — M546 Pain in thoracic spine: Secondary | ICD-10-CM | POA: Insufficient documentation

## 2013-04-13 DIAGNOSIS — E538 Deficiency of other specified B group vitamins: Secondary | ICD-10-CM

## 2013-04-13 DIAGNOSIS — E119 Type 2 diabetes mellitus without complications: Secondary | ICD-10-CM

## 2013-04-13 DIAGNOSIS — I10 Essential (primary) hypertension: Secondary | ICD-10-CM

## 2013-04-13 MED ORDER — METHYLPREDNISOLONE ACETATE 40 MG/ML IJ SUSP: 40.0000 mg | Freq: Once | INTRAMUSCULAR | Status: DC

## 2013-04-13 MED ORDER — TRAMADOL HCL 50 MG PO TABS: 50.0000 mg | ORAL_TABLET | Freq: Two times a day (BID) | ORAL | Status: DC | PRN

## 2013-04-13 MED ORDER — CARVEDILOL 6.25 MG PO TABS: 6.2500 mg | ORAL_TABLET | Freq: Two times a day (BID) | ORAL | Status: DC

## 2013-04-13 MED ORDER — METFORMIN HCL 500 MG PO TABS: 500.0000 mg | ORAL_TABLET | Freq: Two times a day (BID) | ORAL | Status: DC

## 2013-04-13 MED ORDER — RAMIPRIL 10 MG PO CAPS: 10.0000 mg | ORAL_CAPSULE | Freq: Every day | ORAL | Status: DC

## 2013-04-13 MED ORDER — ACCU-CHEK SOFTCLIX LANCETS MISC: 1.0000 | Freq: Two times a day (BID) | Status: DC

## 2013-04-13 MED ORDER — AMLODIPINE BESYLATE 5 MG PO TABS: 5.0000 mg | ORAL_TABLET | Freq: Every day | ORAL | Status: DC

## 2013-04-13 MED ORDER — FUROSEMIDE 40 MG PO TABS: 40.0000 mg | ORAL_TABLET | Freq: Two times a day (BID) | ORAL | Status: DC

## 2013-04-13 MED ORDER — ATORVASTATIN CALCIUM 20 MG PO TABS: 20.0000 mg | ORAL_TABLET | Freq: Every day | ORAL | Status: DC

## 2013-04-13 NOTE — Assessment & Plan Note (Signed)
Out of meds: Azor 5/40 qd while waiting

## 2013-04-13 NOTE — Progress Notes (Signed)
Subjective:    HPI C/o recurrent pain under L shoulder blade - severe  She ran out of her DM and HTN meds   The patient presents for a follow-up of  chronic hypertension, chronic dyslipidemia, type 2 diabetes, CAD, CHF, depression   F/u vit def - not sure if she is taking them right C/o stress -- brother in law just died   Wt Readings from Last 3 Encounters:  04/13/13 166 lb (75.297 kg)  01/25/13 165 lb (74.844 kg)  12/09/11 163 lb (73.936 kg)   BP Readings from Last 3 Encounters:  04/13/13 208/104  01/25/13 130/74  12/09/11 158/78         Review of Systems  Constitutional: Positive for fatigue. Negative for fever, chills, activity change, appetite change and unexpected weight change.  HENT: Negative for congestion, mouth sores and sinus pressure.   Eyes: Negative for visual disturbance.  Respiratory: Positive for shortness of breath (mild). Negative for cough and chest tightness.   Cardiovascular: Negative for palpitations and leg swelling.  Gastrointestinal: Negative for nausea, vomiting and abdominal pain.  Genitourinary: Negative for frequency, difficulty urinating and vaginal pain.  Musculoskeletal: Negative for myalgias, back pain and gait problem.  Skin: Negative for pallor and rash.  Neurological: Negative for dizziness, tremors, weakness, numbness and headaches.  Psychiatric/Behavioral: Negative for confusion and sleep disturbance. The patient is not hyperactive.        Objective:   Physical Exam  Constitutional: She appears well-developed. No distress.  NAD Obese  HENT:  Head: Normocephalic.  Right Ear: External ear normal.  Left Ear: External ear normal.  Nose: Nose normal.  Mouth/Throat: Oropharynx is clear and moist.  Eyes: Conjunctivae are normal. Pupils are equal, round, and reactive to light. Right eye exhibits no discharge. Left eye exhibits no discharge.  Neck: Normal range of motion. Neck supple. No JVD present. No tracheal deviation  present. No thyromegaly present.  Cardiovascular: Normal rate, regular rhythm and normal heart sounds.   Pulmonary/Chest: No stridor. No respiratory distress. She has no wheezes.  Abdominal: Soft. Bowel sounds are normal. She exhibits no distension and no mass. There is no tenderness. There is no rebound and no guarding.  Musculoskeletal: She exhibits no edema and no tenderness.  Lymphadenopathy:    She has no cervical adenopathy.  Neurological: She displays normal reflexes. No cranial nerve deficit. She exhibits normal muscle tone. Coordination normal.  Skin: No rash noted. No erythema.  Psychiatric: She has a normal mood and affect. Her behavior is normal. Judgment and thought content normal.   A 5 cm very tender spot on L chest paraspinal muscle area; no rash Lab Results  Component Value Date   WBC 7.2 01/25/2013   HGB 11.6* 01/25/2013   HCT 33.8* 01/25/2013   PLT 202.0 01/25/2013   GLUCOSE 101* 01/25/2013   CHOL 280* 12/09/2011   TRIG 85.0 12/09/2011   HDL 74.90 12/09/2011   LDLDIRECT 180.7 12/09/2011   LDLCALC 136* 08/17/2011   ALT 15 01/25/2013   AST 17 01/25/2013   NA 140 01/25/2013   K 3.8 01/25/2013   CL 101 01/25/2013   CREATININE 1.2 01/25/2013   BUN 20 01/25/2013   CO2 28 01/25/2013   TSH 1.60 01/25/2013   INR 0.96 07/14/2010   HGBA1C 6.2 01/25/2013     Procedure Note :    Trigger Point Injection:   Indication : Focal tender area identifiable by the location without other identifiable neurologic or musculoskeletal finding or pathology.   Risks  including unsuccessful procedure , bleeding, infection, bruising, skin atrophy and others were explained to the patient in detail as well as the benefits. Informed consent was obtained and signed.   Tthe patient was placed in a comfortable position. 4   points of maximum tenderness over R paraspinal muscle were marked and  the skin was prepped with Betadine and alcohol. 1 inch 25-gauge needle was used. The needle was advanced perpendicular to the skin.  Each trigger point was injected with 1 mL of 2% lidocaine and 10 mg of Depo-Medrol in a usual fashion.  Band-Aids applied.   Tolerated well. Complications: None. Good pain relief following the procedure.        Assessment & Plan:

## 2013-04-13 NOTE — Assessment & Plan Note (Signed)
Out ov meds Kombiglyze 12/998 1 a day while waiting

## 2013-04-13 NOTE — Assessment & Plan Note (Signed)
Recurrent - under L shoulder blade MSK See procedure Tramadol prn

## 2013-04-13 NOTE — Patient Instructions (Addendum)
While you are waiting for your meds take: Azor 1 a day Kombiglyze 1 a day

## 2013-04-13 NOTE — Assessment & Plan Note (Signed)
Continue with current  therapy as reflected on the Med list.  

## 2013-04-15 ENCOUNTER — Other Ambulatory Visit: Payer: Self-pay | Admitting: *Deleted

## 2013-04-15 MED ORDER — ACCU-CHEK SOFTCLIX LANCETS MISC: 1.0000 | Freq: Two times a day (BID) | Status: DC

## 2013-04-15 MED ORDER — CARVEDILOL 6.25 MG PO TABS: 6.2500 mg | ORAL_TABLET | Freq: Two times a day (BID) | ORAL | Status: DC

## 2013-04-15 MED ORDER — ATORVASTATIN CALCIUM 20 MG PO TABS: 20.0000 mg | ORAL_TABLET | Freq: Every day | ORAL | Status: DC

## 2013-04-15 MED ORDER — METFORMIN HCL 500 MG PO TABS: 500.0000 mg | ORAL_TABLET | Freq: Two times a day (BID) | ORAL | Status: DC

## 2013-04-15 MED ORDER — AMLODIPINE BESYLATE 5 MG PO TABS: 5.0000 mg | ORAL_TABLET | Freq: Every day | ORAL | Status: DC

## 2013-04-15 MED ORDER — RAMIPRIL 10 MG PO CAPS: 10.0000 mg | ORAL_CAPSULE | Freq: Every day | ORAL | Status: DC

## 2013-04-15 MED ORDER — FUROSEMIDE 40 MG PO TABS: 40.0000 mg | ORAL_TABLET | Freq: Two times a day (BID) | ORAL | Status: DC

## 2013-04-29 ENCOUNTER — Other Ambulatory Visit: Payer: Self-pay | Admitting: *Deleted

## 2013-04-29 MED ORDER — GLUCOSE BLOOD VI STRP: ORAL_STRIP | Status: DC

## 2013-05-27 ENCOUNTER — Ambulatory Visit: Payer: Medicare Other | Admitting: Internal Medicine

## 2013-12-03 ENCOUNTER — Telehealth: Payer: Self-pay | Admitting: Internal Medicine

## 2013-12-03 NOTE — Telephone Encounter (Signed)
Patient states that she is out of all of her current meds and needs new prescriptions sent to [facility] [facility]. Asked her which specific ones but she kept saying "all of them."

## 2013-12-08 NOTE — Telephone Encounter (Signed)
Sent in the meds that were due for refills.

## 2013-12-21 ENCOUNTER — Other Ambulatory Visit: Payer: Self-pay | Admitting: Internal Medicine

## 2013-12-21 NOTE — Addendum Note (Signed)
Addended by: Cassandria Anger on: 12/21/2013 08:58 PM   Modules accepted: Orders

## 2013-12-21 NOTE — Telephone Encounter (Signed)
[facility] needs the strength directions quantity and number of refills for the following: Metformin, Furosemide, and Klor-Con M20. The pt has never had anything filled by them.

## 2013-12-21 NOTE — Telephone Encounter (Signed)
See Rx Thx 

## 2013-12-23 ENCOUNTER — Other Ambulatory Visit: Payer: Self-pay | Admitting: *Deleted

## 2013-12-23 MED ORDER — METFORMIN HCL 500 MG PO TABS: 500.0000 mg | ORAL_TABLET | Freq: Two times a day (BID) | ORAL | Status: DC

## 2013-12-23 MED ORDER — FUROSEMIDE 40 MG PO TABS: 40.0000 mg | ORAL_TABLET | Freq: Two times a day (BID) | ORAL | Status: DC

## 2013-12-23 MED ORDER — POTASSIUM CHLORIDE CRYS ER 20 MEQ PO TBCR: 20.0000 meq | EXTENDED_RELEASE_TABLET | Freq: Two times a day (BID) | ORAL | Status: DC

## 2014-04-27 ENCOUNTER — Other Ambulatory Visit: Payer: Self-pay

## 2014-04-27 MED ORDER — BD SWABS SINGLE USE BUTTERFLY PADS: MEDICATED_PAD | Status: DC

## 2014-04-27 MED ORDER — ACCU-CHEK AVIVA PLUS W/DEVICE KIT: PACK | Status: DC

## 2014-04-27 MED ORDER — ACCU-CHEK SOFTCLIX LANCETS MISC: 1.0000 | Freq: Two times a day (BID) | Status: DC

## 2014-04-27 MED ORDER — GLUCOSE BLOOD VI STRP: ORAL_STRIP | Status: DC

## 2014-05-16 ENCOUNTER — Telehealth: Payer: Self-pay | Admitting: Internal Medicine

## 2014-05-16 NOTE — Telephone Encounter (Signed)
OV pls: not seen >12 mo Thx

## 2014-05-16 NOTE — Telephone Encounter (Signed)
Patient is seeing Orthopedist at [facility] and states she needs a referral for evaluation for hip surgery.

## 2014-05-16 NOTE — Telephone Encounter (Signed)
Called pt no answer LMOM with md response.../lmb 

## 2014-05-17 ENCOUNTER — Ambulatory Visit: Payer: Medicare Other | Admitting: Internal Medicine

## 2014-06-06 ENCOUNTER — Emergency Department (HOSPITAL_COMMUNITY): Payer: Medicare HMO

## 2014-06-06 ENCOUNTER — Encounter (HOSPITAL_COMMUNITY): Payer: Self-pay | Admitting: Emergency Medicine

## 2014-06-06 ENCOUNTER — Observation Stay (HOSPITAL_COMMUNITY)
Admission: EM | Admit: 2014-06-06 | Discharge: 2014-06-08 | Disposition: A | Discharge status: Home / Self Care | Payer: Medicare HMO | Attending: Internal Medicine | Admitting: Internal Medicine

## 2014-06-06 DIAGNOSIS — Z79899 Other long term (current) drug therapy: Secondary | ICD-10-CM | POA: Insufficient documentation

## 2014-06-06 DIAGNOSIS — Z955 Presence of coronary angioplasty implant and graft: Secondary | ICD-10-CM | POA: Insufficient documentation

## 2014-06-06 DIAGNOSIS — I5042 Chronic combined systolic (congestive) and diastolic (congestive) heart failure: Secondary | ICD-10-CM | POA: Insufficient documentation

## 2014-06-06 DIAGNOSIS — I5022 Chronic systolic (congestive) heart failure: Secondary | ICD-10-CM

## 2014-06-06 DIAGNOSIS — I25118 Atherosclerotic heart disease of native coronary artery with other forms of angina pectoris: Secondary | ICD-10-CM

## 2014-06-06 DIAGNOSIS — K219 Gastro-esophageal reflux disease without esophagitis: Secondary | ICD-10-CM | POA: Diagnosis present

## 2014-06-06 DIAGNOSIS — I252 Old myocardial infarction: Secondary | ICD-10-CM | POA: Insufficient documentation

## 2014-06-06 DIAGNOSIS — E119 Type 2 diabetes mellitus without complications: Secondary | ICD-10-CM | POA: Insufficient documentation

## 2014-06-06 DIAGNOSIS — I2510 Atherosclerotic heart disease of native coronary artery without angina pectoris: Secondary | ICD-10-CM | POA: Insufficient documentation

## 2014-06-06 DIAGNOSIS — E876 Hypokalemia: Secondary | ICD-10-CM | POA: Diagnosis not present

## 2014-06-06 DIAGNOSIS — E785 Hyperlipidemia, unspecified: Secondary | ICD-10-CM | POA: Diagnosis not present

## 2014-06-06 DIAGNOSIS — I25119 Atherosclerotic heart disease of native coronary artery with unspecified angina pectoris: Secondary | ICD-10-CM

## 2014-06-06 DIAGNOSIS — Z7982 Long term (current) use of aspirin: Secondary | ICD-10-CM | POA: Insufficient documentation

## 2014-06-06 DIAGNOSIS — Z9114 Patient's other noncompliance with medication regimen: Secondary | ICD-10-CM | POA: Diagnosis not present

## 2014-06-06 DIAGNOSIS — N183 Chronic kidney disease, stage 3 (moderate): Secondary | ICD-10-CM | POA: Diagnosis not present

## 2014-06-06 DIAGNOSIS — R002 Palpitations: Secondary | ICD-10-CM | POA: Insufficient documentation

## 2014-06-06 DIAGNOSIS — I129 Hypertensive chronic kidney disease with stage 1 through stage 4 chronic kidney disease, or unspecified chronic kidney disease: Secondary | ICD-10-CM | POA: Diagnosis not present

## 2014-06-06 DIAGNOSIS — R079 Chest pain, unspecified: Secondary | ICD-10-CM | POA: Diagnosis present

## 2014-06-06 DIAGNOSIS — N289 Disorder of kidney and ureter, unspecified: Secondary | ICD-10-CM | POA: Insufficient documentation

## 2014-06-06 DIAGNOSIS — I2583 Coronary atherosclerosis due to lipid rich plaque: Secondary | ICD-10-CM

## 2014-06-06 DIAGNOSIS — I10 Essential (primary) hypertension: Secondary | ICD-10-CM

## 2014-06-06 DIAGNOSIS — R9431 Abnormal electrocardiogram [ECG] [EKG]: Secondary | ICD-10-CM

## 2014-06-06 DIAGNOSIS — I2584 Coronary atherosclerosis due to calcified coronary lesion: Secondary | ICD-10-CM | POA: Insufficient documentation

## 2014-06-06 DIAGNOSIS — Z7951 Long term (current) use of inhaled steroids: Secondary | ICD-10-CM | POA: Insufficient documentation

## 2014-06-06 DIAGNOSIS — Z9861 Coronary angioplasty status: Secondary | ICD-10-CM

## 2014-06-06 DIAGNOSIS — R0789 Other chest pain: Principal | ICD-10-CM | POA: Insufficient documentation

## 2014-06-06 HISTORY — DX: Gastrointestinal hemorrhage, unspecified: K92.2

## 2014-06-06 LAB — BASIC METABOLIC PANEL
Anion gap: 13 (ref 5–15)
BUN: 20 mg/dL (ref 6–23)
CALCIUM: 9.2 mg/dL (ref 8.4–10.5)
CO2: 28 meq/L (ref 19–32)
Chloride: 101 mEq/L (ref 96–112)
Creatinine, Ser: 1.23 mg/dL — ABNORMAL HIGH (ref 0.50–1.10)
GFR calc Af Amer: 45 mL/min — ABNORMAL LOW (ref >90)
GFR calc non Af Amer: 39 mL/min — ABNORMAL LOW (ref >90)
Glucose, Bld: 149 mg/dL — ABNORMAL HIGH (ref 70–99)
Potassium: 3.5 mEq/L — ABNORMAL LOW (ref 3.7–5.3)
Sodium: 142 mEq/L (ref 137–147)

## 2014-06-06 LAB — LIPID PANEL
Cholesterol: 244 mg/dL — ABNORMAL HIGH (ref 0–200)
HDL: 73 mg/dL (ref >39)
LDL Cholesterol: 151 mg/dL — ABNORMAL HIGH (ref 0–99)
TRIGLYCERIDES: 99 mg/dL (ref <150)
Total CHOL/HDL Ratio: 3.3 RATIO
VLDL: 20 mg/dL (ref 0–40)

## 2014-06-06 LAB — CBC
HEMATOCRIT: 37.9 % (ref 36.0–46.0)
Hemoglobin: 12.6 g/dL (ref 12.0–15.0)
MCH: 30 pg (ref 26.0–34.0)
MCHC: 33.2 g/dL (ref 30.0–36.0)
MCV: 90.2 fL (ref 78.0–100.0)
Platelets: 185 10*3/uL (ref 150–400)
RBC: 4.2 MIL/uL (ref 3.87–5.11)
RDW: 12.4 % (ref 11.5–15.5)
WBC: 4.9 10*3/uL (ref 4.0–10.5)

## 2014-06-06 LAB — I-STAT TROPONIN, ED: Troponin i, poc: 0.01 ng/mL (ref 0.00–0.08)

## 2014-06-06 LAB — TROPONIN I
Troponin I: <0.3 ng/mL (ref <0.30)
Troponin I: <0.3 ng/mL (ref <0.30)

## 2014-06-06 LAB — GLUCOSE, CAPILLARY
GLUCOSE-CAPILLARY: 134 mg/dL — AB (ref 70–99)
Glucose-Capillary: 160 mg/dL — ABNORMAL HIGH (ref 70–99)

## 2014-06-06 LAB — HEMOGLOBIN A1C
Hgb A1c MFr Bld: 6.7 % — ABNORMAL HIGH (ref <5.7)
MEAN PLASMA GLUCOSE: 146 mg/dL — AB (ref <117)

## 2014-06-06 MED ORDER — NITROGLYCERIN 0.4 MG SL SUBL: 0.4000 mg | SUBLINGUAL_TABLET | SUBLINGUAL | Status: DC | PRN

## 2014-06-06 MED ORDER — POTASSIUM CHLORIDE CRYS ER 20 MEQ PO TBCR
20.0000 meq | EXTENDED_RELEASE_TABLET | Freq: Two times a day (BID) | ORAL | Status: DC
  Administered 2014-06-06 – 2014-06-07 (×3): 20 meq via ORAL
  Filled 2014-06-06 (×6): qty 1

## 2014-06-06 MED ORDER — AMLODIPINE BESYLATE 5 MG PO TABS
5.0000 mg | ORAL_TABLET | Freq: Every day | ORAL | Status: DC
  Administered 2014-06-06 – 2014-06-07 (×2): 5 mg via ORAL
  Filled 2014-06-06 (×3): qty 1

## 2014-06-06 MED ORDER — FLUTICASONE PROPIONATE 50 MCG/ACT NA SUSP
2.0000 | Freq: Every day | NASAL | Status: DC
Start: 2014-06-06 — End: 2014-06-08
  Filled 2014-06-06: qty 16

## 2014-06-06 MED ORDER — POTASSIUM CHLORIDE CRYS ER 20 MEQ PO TBCR
40.0000 meq | EXTENDED_RELEASE_TABLET | Freq: Once | ORAL | Status: AC
  Administered 2014-06-06: 40 meq via ORAL
  Filled 2014-06-06: qty 2

## 2014-06-06 MED ORDER — ASPIRIN EC 81 MG PO TBEC
81.0000 mg | DELAYED_RELEASE_TABLET | Freq: Every day | ORAL | Status: DC
  Administered 2014-06-06 – 2014-06-07 (×2): 81 mg via ORAL
  Filled 2014-06-06 (×3): qty 1

## 2014-06-06 MED ORDER — ACETAMINOPHEN 325 MG PO TABS
650.0000 mg | ORAL_TABLET | ORAL | Status: DC | PRN
  Administered 2014-06-06 – 2014-06-08 (×4): 650 mg via ORAL
  Filled 2014-06-06 (×4): qty 2

## 2014-06-06 MED ORDER — CARVEDILOL 6.25 MG PO TABS
6.2500 mg | ORAL_TABLET | Freq: Two times a day (BID) | ORAL | Status: DC
  Administered 2014-06-06 – 2014-06-08 (×4): 6.25 mg via ORAL
  Filled 2014-06-06 (×6): qty 1

## 2014-06-06 MED ORDER — ASPIRIN 81 MG PO CHEW
324.0000 mg | CHEWABLE_TABLET | Freq: Once | ORAL | Status: AC
  Administered 2014-06-06: 324 mg via ORAL
  Filled 2014-06-06: qty 4

## 2014-06-06 MED ORDER — PANTOPRAZOLE SODIUM 40 MG PO TBEC
40.0000 mg | DELAYED_RELEASE_TABLET | Freq: Every day | ORAL | Status: DC
  Administered 2014-06-06 – 2014-06-07 (×2): 40 mg via ORAL
  Filled 2014-06-06 (×2): qty 1

## 2014-06-06 MED ORDER — INSULIN ASPART 100 UNIT/ML ~~LOC~~ SOLN: 0.0000 [IU] | Freq: Every day | SUBCUTANEOUS | Status: DC

## 2014-06-06 MED ORDER — CITALOPRAM HYDROBROMIDE 20 MG PO TABS
20.0000 mg | ORAL_TABLET | Freq: Every day | ORAL | Status: DC
  Administered 2014-06-06 – 2014-06-07 (×2): 20 mg via ORAL
  Filled 2014-06-06 (×3): qty 1

## 2014-06-06 MED ORDER — ENOXAPARIN SODIUM 30 MG/0.3ML ~~LOC~~ SOLN
30.0000 mg | SUBCUTANEOUS | Status: DC
  Administered 2014-06-06 – 2014-06-08 (×3): 30 mg via SUBCUTANEOUS
  Filled 2014-06-06 (×3): qty 0.3

## 2014-06-06 MED ORDER — TRAMADOL HCL 50 MG PO TABS
50.0000 mg | ORAL_TABLET | Freq: Two times a day (BID) | ORAL | Status: DC | PRN
  Administered 2014-06-08: 50 mg via ORAL
  Filled 2014-06-06: qty 1

## 2014-06-06 MED ORDER — ATORVASTATIN CALCIUM 20 MG PO TABS
20.0000 mg | ORAL_TABLET | Freq: Every day | ORAL | Status: DC
  Administered 2014-06-06 – 2014-06-07 (×2): 20 mg via ORAL
  Filled 2014-06-06 (×2): qty 1

## 2014-06-06 MED ORDER — ONDANSETRON HCL 4 MG/2ML IJ SOLN: 4.0000 mg | Freq: Four times a day (QID) | INTRAMUSCULAR | Status: DC | PRN

## 2014-06-06 MED ORDER — INSULIN ASPART 100 UNIT/ML ~~LOC~~ SOLN
0.0000 [IU] | Freq: Three times a day (TID) | SUBCUTANEOUS | Status: DC
  Administered 2014-06-07: 2 [IU] via SUBCUTANEOUS
  Administered 2014-06-08: 1 [IU] via SUBCUTANEOUS

## 2014-06-06 NOTE — ED Provider Notes (Signed)
CSN: [id number]     Arrival date & time 06/06/14  4098 History   First MD Initiated Contact with Patient 06/06/14 [phone]     Chief Complaint  Patient presents with  . Chest Pain    heart fluttering  . Shortness of Breath    no 02 at home     (Consider location/radiation/quality/duration/timing/severity/associated sxs/prior Treatment) HPI Comments: 80 year-old female with history of diabetes, lipids, high blood pressure, CAD, congestive heart failure with 40% ejection fraction on record presents after episode of heart palpitations and chest pressure lasting 10 minutes at 9:00 this morning. Patient just feels generally weak since and no other symptoms currently. Patient has seen Dr. Claiborne Billings cardiology outpatient. No recent diaphoresis ever patient has had mild worsening shortness of breath with exertion. Nonradiating symptoms  Patient is a 80 y.o. female presenting with chest pain and shortness of breath. The history is provided by the patient.  Chest Pain Associated symptoms: palpitations and shortness of breath   Associated symptoms: no abdominal pain, no back pain, no cough, no fever, no headache and not vomiting   Shortness of Breath Associated symptoms: chest pain   Associated symptoms: no abdominal pain, no cough, no fever, no headaches, no neck pain, no rash and no vomiting     Past Medical History  Diagnosis Date  . Hypertension   . Osteoarthritis   . Osteopenia   . Diverticulosis of colon   . Diabetes mellitus     type II  . Hyperlipidemia   . CAD (coronary artery disease)   . MI (myocardial infarction) 05-18-11    Dr Claiborne Billings, Rx'd with CFX  DES  . OSA on CPAP   . Asthma   . Stroke   . HYPERLIPIDEMIA 01/07/2008   Past Surgical History  Procedure Laterality Date  . Abdominal hysterectomy  1980    no bso  . 2 stents  9-11   Family History  Problem Relation Age of Onset  . Hypertension Other    History  Substance Use Topics  . Smoking status: Never Smoker   .  Smokeless tobacco: Not on file  . Alcohol Use: No   OB History   Grav Para Term Preterm Abortions TAB SAB Ect Mult Living                 Review of Systems  Constitutional: Negative for fever and chills.  HENT: Negative for congestion.   Eyes: Negative for visual disturbance.  Respiratory: Positive for shortness of breath. Negative for cough.   Cardiovascular: Positive for chest pain and palpitations. Negative for leg swelling.  Gastrointestinal: Negative for vomiting and abdominal pain.  Genitourinary: Negative for dysuria and flank pain.  Musculoskeletal: Negative for back pain, neck pain and neck stiffness.  Skin: Negative for rash.  Neurological: Positive for light-headedness. Negative for headaches.      Allergies  Clopidogrel bisulfate and Lisinopril  Home Medications   Prior to Admission medications   Medication Sig Start Date End Date Taking? Authorizing Provider  ACCU-CHEK SOFTCLIX LANCETS lancets 1 each by Other route 2 (two) times daily. 04/27/14   Aleksei Plotnikov V, MD  Alcohol Swabs (B-D SINGLE USE SWABS BUTTERFLY) PADS Use swabs to clean area to be tested twice daily 04/27/14   Aleksei Plotnikov V, MD  amLODipine (NORVASC) 5 MG tablet Take 1 tablet (5 mg total) by mouth daily. 04/15/13 04/15/14  Aleksei Plotnikov V, MD  aspirin 81 MG EC tablet Take 81 mg by mouth daily.  Historical Provider, MD  atorvastatin (LIPITOR) 20 MG tablet Take 1 tablet (20 mg total) by mouth daily. 04/15/13 04/15/14  Aleksei Plotnikov V, MD  Blood Glucose Monitoring Suppl (ACCU-CHEK AVIVA PLUS) W/DEVICE KIT Use device to check blood sugar twice daily 04/27/14   Lew Dawes V, MD  carvedilol (COREG) 6.25 MG tablet Take 1 tablet (6.25 mg total) by mouth 2 (two) times daily with a meal. 04/15/13 04/15/14  Aleksei Plotnikov V, MD  cholecalciferol (VITAMIN D) 1000 UNITS tablet Take 1 tablet (1,000 Units total) by mouth daily. 09/09/11 09/08/12  Aleksei Plotnikov V, MD  citalopram (CELEXA) 20 MG  tablet Take 1 tablet (20 mg total) by mouth daily. 12/09/11   Aleksei Plotnikov V, MD  clonazePAM (KLONOPIN) 0.5 MG disintegrating tablet Take 1 tablet (0.5 mg total) by mouth 2 (two) times daily as needed. 12/09/11 03/13/12  Aleksei Plotnikov V, MD  Cyanocobalamin (VITAMIN B-12) 500 MCG SUBL Place 1 tablet (500 mcg total) under the tongue 1 day or 1 dose. 09/09/11   Aleksei Plotnikov V, MD  fluticasone (FLONASE) 50 MCG/ACT nasal spray Place 2 sprays into the nose daily. 09/09/11   Aleksei Plotnikov V, MD  furosemide (LASIX) 40 MG tablet Take 1 tablet (40 mg total) by mouth 2 (two) times daily. 12/23/13 12/23/14  Aleksei Plotnikov V, MD  glucose blood (ACCU-CHEK AVIVA PLUS) test strip Use to test blood sugar twice daily 04/27/14   Aleksei Plotnikov V, MD  glucose blood test strip Use twice daily as instructed. Dx: 250.00. 04/29/13   Cassandria Anger, MD  metFORMIN (GLUCOPHAGE) 500 MG tablet Take 1 tablet (500 mg total) by mouth 2 (two) times daily with a meal. 12/23/13 12/23/14  Aleksei Plotnikov V, MD  nitroGLYCERIN (NITROSTAT) 0.4 MG SL tablet Place 1 tablet (0.4 mg total) under the tongue every 5 (five) minutes as needed. 09/09/11   Aleksei Plotnikov V, MD  pantoprazole (PROTONIX) 40 MG tablet Take 1 tablet (40 mg total) by mouth daily. 01/25/13   Aleksei Plotnikov V, MD  potassium chloride SA (K-DUR,KLOR-CON) 20 MEQ tablet Take 1 tablet (20 mEq total) by mouth 2 (two) times daily. 12/23/13 12/23/14  Aleksei Plotnikov V, MD  ramipril (ALTACE) 10 MG capsule Take 1 capsule (10 mg total) by mouth daily. 04/15/13   Aleksei Plotnikov V, MD  traMADol (ULTRAM) 50 MG tablet Take 1-2 tablets (50-100 mg total) by mouth 2 (two) times daily as needed for pain. 1-2 tabs bid prn for pain 04/13/13   Aleksei Plotnikov V, MD   BP 105/67  Pulse 58  Temp(Src) 98.1 F (36.7 C) (Oral)  Resp 18  Ht _0  (1.651 m)  Wt 162 lb (73.483 kg)  BMI 26.96 kg/m2  SpO2 98% Physical Exam  Nursing note and vitals reviewed. Constitutional:  She is oriented to person, place, and time. She appears well-developed and well-nourished.  HENT:  Head: Normocephalic and atraumatic.  Eyes: Conjunctivae are normal. Right eye exhibits no discharge. Left eye exhibits no discharge.  Neck: Normal range of motion. Neck supple. No tracheal deviation present.  Cardiovascular: Normal rate and regular rhythm.   Pulmonary/Chest: Effort normal and breath sounds normal.  Abdominal: Soft. She exhibits no distension. There is no tenderness. There is no guarding.  Musculoskeletal: She exhibits no edema and no tenderness.  Neurological: She is alert and oriented to person, place, and time.  Skin: Skin is warm. No rash noted.  Psychiatric: She has a normal mood and affect.    ED Course  Procedures (including  critical care time) Labs Review Labs Reviewed  BASIC METABOLIC PANEL - Abnormal; Notable for the following:    Potassium 3.5 (*)    Glucose, Bld 149 (*)    Creatinine, Ser 1.23 (*)    GFR calc non Af Amer 39 (*)    GFR calc Af Amer 45 (*)    All other components within normal limits  CBC  I-STAT TROPOININ, ED    Imaging Review Dg Chest 2 View  06/06/2014   CLINICAL DATA:  Shortness of breath.  Chest pain.  EXAM: CHEST  2 VIEW  COMPARISON:  08/21/2011  FINDINGS: There is borderline cardiomegaly. Pulmonary vascularity is normal. The lungs are clear. No effusions. Calcification in the mitral valve annulus. No osseous abnormality of significance.  IMPRESSION: Borderline cardiomegaly.  No acute abnormalities.   Electronically Signed   By: Rozetta Nunnery M.D.   On: 06/06/2014 09:43     EKG Interpretation   Date/Time:  Monday June 06 2014 09:17:32 EDT Ventricular Rate:  64 PR Interval:  180 QRS Duration: 106 QT Interval:  430 QTC Calculation: 443 R Axis:   -30 Text Interpretation:  Normal sinus rhythm Left axis deviation Left  ventricular hypertrophy T wave abnormality, consider lateral ischemia  Abnormal ECG T wave inversions anterior  resolved Confirmed by Avin Gibbons  MD,  Earl Losee ([id number]) on 06/06/2014 10:02:46 AM      MDM   Final diagnoses:  Acute chest pain     Patient  with vascular risk factors, CAD and congestive heart failure history presents with palpitations and chest pressure she is resolved. Patient denies active cancer, blood clot history, unilateral leg symptoms, recent surgery. With recent onset of symptoms, moderate pretest probability and nonspecific EKG plan for telemetry observation. Aspirin ordered, patient chest pain-free currently. [facility] paged for  obs telemetry.   Dr Tana Coast accepted tele.  The patients results and plan were reviewed and discussed.   Any x-rays performed were personally reviewed by myself.   Differential diagnosis were considered with the presenting HPI.  Medications  aspirin chewable tablet 324 mg (324 mg Oral Given 06/06/14 1113)    Filed Vitals:   06/06/14 0917 06/06/14 0919 06/06/14 1007 06/06/14 1111  BP:  182/71 176/75 105/67  Pulse:  61 57 58  Temp:  98.1 F (36.7 C)    TempSrc:  Oral    Resp:  _0 Height: _1  (1.651 m)     Weight: 162 lb (73.483 kg)     SpO2:  96% 98% 98%    Final diagnoses:  Acute chest pain    Admission/ observation were discussed with the admitting physician, patient and/or family and they are comfortable with the plan.      Mariea Clonts, MD 06/06/14 1130

## 2014-06-06 NOTE — ED Notes (Signed)
Patient states she slept good last pm was up in the kitchen this am and her heart started beating fast, she became dizzy, denies chest pain or headache.

## 2014-06-06 NOTE — H&P (Signed)
History and Physical       Hospital Admission Note Date: 06/06/2014  Patient name: Mary Cook Medical record number: [id number] Date of birth: 01-05-1926 Age: 80 y.o. Gender: female  PCP: Walker Kehr, MD    Chief Complaint:   chest pain  HPI: Patient is 80 year old female with history of CAD, hypertension, hyperlipidemia, GERD, diabetes, CHF, chronic systolic with EF of 75-05%, last echo in 04/2010 presented to ED with chest pain episode. History was obtained from the patient reported that she was in her normal state of health until 9 AM this morning, she noticed some palpitations followed by chest pain, midsternal lasting for about 5 minutes. Patient denied any radiation of the chest pain, diaphoresis, any nausea vomiting or shortness of breath. She felt generalized weakness. Patient sees Dr. Shelva Majestic as her cardiologist.   Review of Systems:  Constitutional: Denies fever, chills, diaphoresis, poor appetite and fatigue.  HEENT: Denies photophobia, eye pain, redness, hearing loss, ear pain, congestion, sore throat, rhinorrhea, sneezing, mouth sores, trouble swallowing, neck pain, neck stiffness and tinnitus.   Respiratory: Denies SOB, DOE, cough, and wheezing.   Cardiovascular: please see history of present illness  Gastrointestinal: Denies nausea, vomiting, abdominal pain, diarrhea, constipation, blood in stool and abdominal distention.  Genitourinary: Denies dysuria, urgency, frequency, hematuria, flank pain and difficulty urinating.  Musculoskeletal: Denies myalgias, back pain, joint swelling, arthralgias and gait problem.  Skin: Denies pallor, rash and wound.  Neurological: Denies dizziness, seizures, syncope, weakness, light-headedness, numbness and headaches.  Hematological: Denies adenopathy. Easy bruising, personal or family bleeding history  Psychiatric/Behavioral: Denies suicidal ideation, mood changes,  confusion, nervousness, sleep disturbance and agitation  Past Medical History: Past Medical History  Diagnosis Date  . Hypertension   . Osteoarthritis   . Osteopenia   . Diverticulosis of colon   . Diabetes mellitus     type II  . Hyperlipidemia   . CAD (coronary artery disease)   . MI (myocardial infarction) 05-18-11    Dr Claiborne Billings, Rx'd with CFX  DES  . OSA on CPAP   . Asthma   . Stroke   . HYPERLIPIDEMIA 01/07/2008   Past Surgical History  Procedure Laterality Date  . Abdominal hysterectomy  1980    no bso  . 2 stents  9-11    Medications: Prior to Admission medications   Medication Sig Start Date End Date Taking? Authorizing Provider  ACCU-CHEK SOFTCLIX LANCETS lancets 1 each by Other route 2 (two) times daily. 04/27/14   Aleksei Plotnikov V, MD  Alcohol Swabs (B-D SINGLE USE SWABS BUTTERFLY) PADS Use swabs to clean area to be tested twice daily 04/27/14   Aleksei Plotnikov V, MD  amLODipine (NORVASC) 5 MG tablet Take 1 tablet (5 mg total) by mouth daily. 04/15/13 04/15/14  Aleksei Plotnikov V, MD  aspirin 81 MG EC tablet Take 81 mg by mouth daily.      Historical Provider, MD  atorvastatin (LIPITOR) 20 MG tablet Take 1 tablet (20 mg total) by mouth daily. 04/15/13 04/15/14  Aleksei Plotnikov V, MD  Blood Glucose Monitoring Suppl (ACCU-CHEK AVIVA PLUS) W/DEVICE KIT Use device to check blood sugar twice daily 04/27/14   Lew Dawes V, MD  carvedilol (COREG) 6.25 MG tablet Take 1 tablet (6.25 mg total) by mouth 2 (two) times daily with a meal. 04/15/13 04/15/14  Aleksei Plotnikov V, MD  cholecalciferol (VITAMIN D) 1000 UNITS tablet Take 1 tablet (1,000 Units total) by mouth daily. 09/09/11 09/08/12  Aleksei Plotnikov V, MD  citalopram (CELEXA)  20 MG tablet Take 1 tablet (20 mg total) by mouth daily. 12/09/11   Aleksei Plotnikov V, MD  clonazePAM (KLONOPIN) 0.5 MG disintegrating tablet Take 1 tablet (0.5 mg total) by mouth 2 (two) times daily as needed. 12/09/11 03/13/12  Aleksei Plotnikov V,  MD  Cyanocobalamin (VITAMIN B-12) 500 MCG SUBL Place 1 tablet (500 mcg total) under the tongue 1 day or 1 dose. 09/09/11   Aleksei Plotnikov V, MD  fluticasone (FLONASE) 50 MCG/ACT nasal spray Place 2 sprays into the nose daily. 09/09/11   Aleksei Plotnikov V, MD  furosemide (LASIX) 40 MG tablet Take 1 tablet (40 mg total) by mouth 2 (two) times daily. 12/23/13 12/23/14  Aleksei Plotnikov V, MD  glucose blood (ACCU-CHEK AVIVA PLUS) test strip Use to test blood sugar twice daily 04/27/14   Aleksei Plotnikov V, MD  glucose blood test strip Use twice daily as instructed. Dx: 250.00. 04/29/13   Cassandria Anger, MD  metFORMIN (GLUCOPHAGE) 500 MG tablet Take 1 tablet (500 mg total) by mouth 2 (two) times daily with a meal. 12/23/13 12/23/14  Aleksei Plotnikov V, MD  nitroGLYCERIN (NITROSTAT) 0.4 MG SL tablet Place 1 tablet (0.4 mg total) under the tongue every 5 (five) minutes as needed. 09/09/11   Aleksei Plotnikov V, MD  pantoprazole (PROTONIX) 40 MG tablet Take 1 tablet (40 mg total) by mouth daily. 01/25/13   Aleksei Plotnikov V, MD  potassium chloride SA (K-DUR,KLOR-CON) 20 MEQ tablet Take 1 tablet (20 mEq total) by mouth 2 (two) times daily. 12/23/13 12/23/14  Aleksei Plotnikov V, MD  ramipril (ALTACE) 10 MG capsule Take 1 capsule (10 mg total) by mouth daily. 04/15/13   Aleksei Plotnikov V, MD  traMADol (ULTRAM) 50 MG tablet Take 1-2 tablets (50-100 mg total) by mouth 2 (two) times daily as needed for pain. 1-2 tabs bid prn for pain 04/13/13   Cassandria Anger, MD    Allergies:   Allergies  Allergen Reactions  . Clopidogrel Bisulfate Nausea And Vomiting  . Lisinopril Cough    Social History:  reports that she has never smoked. She does not have any smokeless tobacco history on file. She reports that she does not drink alcohol or use illicit drugs.  Family History: Family History  Problem Relation Age of Onset  . Hypertension Other     Physical Exam: Blood pressure 105/67, pulse 61, temperature  98.1 F (36.7 C), temperature source Oral, resp. rate 15, height $RemoveBe'5\' 5"'shWebQLgo$  (1.651 m), weight 73.483 kg (162 lb), SpO2 100.00%. General: Alert, awake, oriented x3, in no acute distress. HEENT: normocephalic, atraumatic, anicteric sclera, pink conjunctiva, pupils equal and reactive to light and accomodation, oropharynx clear Neck: supple, no masses or lymphadenopathy, no goiter, no bruits  Heart: Regular rate and rhythm, without murmurs, rubs or gallops. Lungs: Clear to auscultation bilaterally, no wheezing, rales or rhonchi. Abdomen: Soft, nontender, nondistended, positive bowel sounds, no masses. Extremities: No clubbing, cyanosis or edema with positive pedal pulses. Neuro: Grossly intact, no focal neurological deficits, strength 5/5 upper and lower extremities bilaterally Psych: alert and oriented x 3, normal mood and affect Skin: no rashes or lesions, warm and dry   LABS on Admission:  Basic Metabolic Panel:  Recent Labs Lab 06/06/14 0945  NA 142  K 3.5*  CL 101  CO2 28  GLUCOSE 149*  BUN 20  CREATININE 1.23*  CALCIUM 9.2   Liver Function Tests: No results found for this basename: AST, ALT, ALKPHOS, BILITOT, PROT, ALBUMIN,  in the last 168  hours No results found for this basename: LIPASE, AMYLASE,  in the last 168 hours No results found for this basename: AMMONIA,  in the last 168 hours CBC:  Recent Labs Lab 06/06/14 0945  WBC 4.9  HGB 12.6  HCT 37.9  MCV 90.2  PLT 185   Cardiac Enzymes: No results found for this basename: CKTOTAL, CKMB, CKMBINDEX, TROPONINI,  in the last 168 hours BNP: No components found with this basename: POCBNP,  CBG: No results found for this basename: GLUCAP,  in the last 168 hours   Radiological Exams on Admission: Dg Chest 2 View  06/06/2014   CLINICAL DATA:  Shortness of breath.  Chest pain.  EXAM: CHEST  2 VIEW  COMPARISON:  08/21/2011  FINDINGS: There is borderline cardiomegaly. Pulmonary vascularity is normal. The lungs are clear. No  effusions. Calcification in the mitral valve annulus. No osseous abnormality of significance.  IMPRESSION: Borderline cardiomegaly.  No acute abnormalities.   Electronically Signed   By: Rozetta Nunnery M.D.   On: 06/06/2014 09:43    EKG showed rate 64, T wave flattening in V5 and V6   Assessment/Plan Principal Problem:   Chest pain risk factors include previous history of CAD, diabetes, hypertension, hyperlipidemia, MI in 2011 with DES - Admit to telemetry, rule out acute ACS, one set of troponins negative  - Currently chest pain resolved, cardiology consulted, n.p.o. after midnight   Active Problems:   Diabetes mellitus -Obtain hemoglobin A1c, place on sliding scale insulin inpatient     Hyperlipidemia - Obtain lipid panel    Essential hypertension - Currently stable  Mild acute renal insufficiency with hypokalemia - Hold Lasix and ramipril today, restart in a.m. if stable    GERD (gastroesophageal reflux disease) -Continue PPI   DVT prophylaxis:  Lovenox  CODE STATUS:  Full code  Family Communication: Admission, patients condition and plan of care including tests being ordered have been discussed with the patient and  husband who indicates understanding and agree with the plan and Code Status   Further plan will depend as patient's clinical course evolves and further radiologic and laboratory data become available.   Time Spent on Admission:  1 hour  RAI,RIPUDEEP M.D. [facility] 06/06/2014, 11:59 AM Pager: [phone]  If 7PM-7AM, please contact night-coverage [email or web address] Password [email or web address]

## 2014-06-06 NOTE — ED Notes (Signed)
Patient states she had "heart fluttering this morning that lasted approximately 5 minutes.   It hurt, but not too bad".  Patient states it has resolved at this time.  Patient states she still has SOB, but no distress noted and patient is speaking in full sentences.

## 2014-06-06 NOTE — ED Notes (Signed)
Pt returns from xray family at side.

## 2014-06-06 NOTE — Consult Note (Signed)
CARDIOLOGY CONSULT NOTE   Patient IDMalina Cook MRN: [id number] DOB/AGE: 09/04/1925 80 y.o.  Admit date: 06/06/2014  Primary Physician   Walker Kehr, MD Primary Cardiologist  Dr. Claiborne Billings Reason for Consultation  Chest pain  Mary Cook is an 80 y.o. female with a history of CAD, HTN, DM, HLD and CVA. She had a stent to the CFX in 2011 in the setting of a STEMI. She had repeat catheterization in 07/2011, results below. Medical therapy was recommended for non-critical multi-vessel disease.   She has a long history of brief palpitations, lasting only a few seconds. There are no symptoms associated with this. She has noticed a weight gain of approximately 7 pounds over the last several weeks. She feels like she is eating more but states she also thinks she put on some fluid. She has noticed in increase in her dyspnea on exertion, stating she will get short of breath walking room-room in the house. She denies orthopnea but has been waking up at night feeling hot, short of breath and diaphoretic. She put this down to hot flashes but admits that she hasn't had hot flashes in > 10 years and they were not associated with shortness of breath.  Today, she had palpitations that lasted between 5 and 10 minutes. After the palpitations started, she got a few seconds of very sharp and very deep chest pain. She has never had chest pain before. It resolved by itself. She had not taken her morning medications when the palpitations began. She did not take any medications for the palpitations. They resolved without any intervention. The palpitations made her feel short of breath but there was no presyncope or syncope. In the emergency room, she has not had recurrence of chest pain or the palpitations.  She has not had any illnesses, fevers or chills. She has a remote history of GI bleed in 2002 that was felt to be diverticular, but has no ongoing GI issues and no recent history of bleeding  or dark, tarry stools.  Past Medical History  Diagnosis Date  . Hypertension   . Osteoarthritis   . Osteopenia   . Diverticulosis of colon   . Diabetes mellitus     type II  . Hyperlipidemia   . CAD (coronary artery disease)   . MI (myocardial infarction) 05/10/2010    Dr Claiborne Billings, Rx'd with CFX  DES  . OSA on CPAP   . Asthma   . Stroke 2002  . HYPERLIPIDEMIA 01/07/2008  . GIB (gastrointestinal bleeding) 2002    Diverticular    Past Surgical History  Procedure Laterality Date  . Abdominal hysterectomy  1980    no bso  . Coronary angioplasty with stent placement  05/11/2010    stent CFX  . Cardiac catheterization  07/2011    LAD OK, D1 80%, CFX 30% w/ patent stent, RCA 50-60%, EF 40%    Allergies  Allergen Reactions  . Clopidogrel Bisulfate Nausea And Vomiting  . Lisinopril Cough    I have reviewed the patient's current medications  Medication Sig  ACCU-CHEK SOFTCLIX LANCETS lancets 1 each by Other route 2 (two) times daily.  Alcohol Swabs (B-D SINGLE USE SWABS BUTTERFLY) PADS Use swabs to clean area to be tested twice daily  amLODipine (NORVASC) 5 MG tablet Take 1 tablet (5 mg total) by mouth daily.  aspirin 81 MG EC tablet Take 81 mg by mouth daily.    atorvastatin (LIPITOR) 20 MG tablet Take 1 tablet (20  mg total) by mouth daily.  Blood Glucose Monitoring Suppl (ACCU-CHEK AVIVA PLUS) W/DEVICE KIT Use device to check blood sugar twice daily  carvedilol (COREG) 6.25 MG tablet Take 1 tablet (6.25 mg total) by mouth 2 (two) times daily with a meal.  cholecalciferol (VITAMIN D) 1000 UNITS tablet Take 1 tablet (1,000 Units total) by mouth daily.  citalopram (CELEXA) 20 MG tablet Take 1 tablet (20 mg total) by mouth daily.  clonazePAM (KLONOPIN) 0.5 MG disintegrating tablet Take 1 tablet (0.5 mg total) by mouth 2 (two) times daily as needed.  Cyanocobalamin (VITAMIN B-12) 500 MCG SUBL Place 1 tablet (500 mcg total) under the tongue 1 day or 1 dose.  fluticasone (FLONASE) 50  MCG/ACT nasal spray Place 2 sprays into the nose daily.  furosemide (LASIX) 40 MG tablet Take 1 tablet (40 mg total) by mouth 2 (two) times daily.  glucose blood (ACCU-CHEK AVIVA PLUS) test strip Use to test blood sugar twice daily  glucose blood test strip Use twice daily as instructed. Dx: 250.00.  metFORMIN (GLUCOPHAGE) 500 MG tablet Take 1 tablet (500 mg total) by mouth 2 (two) times daily with a meal.  nitroGLYCERIN (NITROSTAT) 0.4 MG SL tablet Place 1 tablet (0.4 mg total) under the tongue every 5 (five) minutes as needed.  pantoprazole (PROTONIX) 40 MG tablet Take 1 tablet (40 mg total) by mouth daily.  potassium chloride SA (K-DUR,KLOR-CON) 20 MEQ tablet Take 1 tablet (20 mEq total) by mouth 2 (two) times daily.  ramipril (ALTACE) 10 MG capsule Take 1 capsule (10 mg total) by mouth daily.  traMADol (ULTRAM) 50 MG tablet Take 1-2 tablets (50-100 mg total) by mouth 2 (two) times daily as needed for pain. 1-2 tabs bid prn for pain     History   Social History  . Marital Status: Married    Spouse Name: N/A    Number of Children: N/A  . Years of Education: N/A   Occupational History  . Retired    Social History Main Topics  . Smoking status: Never Smoker   . Smokeless tobacco: Not on file  . Alcohol Use: No  . Drug Use: No  . Sexual Activity:    Other Topics Concern  . Not on file   Social History Narrative  . No narrative on file    Family Status  Relation Status Death Age  . Mother Deceased 72    DM, CAD  . Father Deceased 96    CAD   Family History  Problem Relation Age of Onset  . Hypertension Other      ROS:  Full 14 point review of systems complete and found to be negative unless listed above.  Physical Exam: Blood pressure 176/67, pulse 61, temperature 98.1 F (36.7 C), temperature source Oral, resp. rate 15, height $RemoveBe'5\' 5"'eEOhUKBfw$  (1.651 m), weight 162 lb (73.483 kg), SpO2 100.00%.  General: Well developed, well nourished, female in no acute distress Head: Eyes  PERRLA, No xanthomas.   Normocephalic and atraumatic, oropharynx without edema or exudate. Dentition: Poor Lungs: Clear bilaterally Heart: HRRR S1 S2, no rub/gallop, no murmur. pulses are 2+ all 4 extrem.   Neck: No carotid bruits. No lymphadenopathy.  JVD not elevated. Abdomen: Bowel sounds present, abdomen soft and non-tender without masses or hernias noted. Msk:  No spine or cva tenderness. No weakness, no joint deformities or effusions. Extremities: No clubbing or cyanosis. No edema.  Neuro: Alert and oriented X 3. No focal deficits noted. Psych:  Good affect, responds appropriately  Skin: No rashes or lesions noted.  Labs:   Lab Results  Component Value Date   WBC 4.9 06/06/2014   HGB 12.6 06/06/2014   HCT 37.9 06/06/2014   MCV 90.2 06/06/2014   PLT 185 06/06/2014     Recent Labs Lab 06/06/14 0945  NA 142  K 3.5*  CL 101  CO2 28  BUN 20  CREATININE 1.23*  CALCIUM 9.2  GLUCOSE 149*    Recent Labs  06/06/14 0950  TROPIPOC 0.01   Lab Results  Component Value Date   CHOL 280* 12/09/2011   HDL 74.90 12/09/2011   LDLCALC 136* 08/17/2011   TRIG 85.0 12/09/2011   TSH  Date/Time Value Ref Range Status  01/25/2013  3:15 PM 1.60  0.35 - 5.50 uIU/mL Final   Echo: 05/17/2010 Conclusions - Left ventricle: The cavity size was normal. Systolic function was mildly to moderately reduced. The estimated ejection fraction was in the range of 40% to 45%. Severe hypokinesis of the lateral myocardium; consistent with infarction in the distribution of the left circumflex coronary artery. Mild hypokinesis of the inferolateral myocardium; improved from the study of May 11, 2010. Doppler parameters are consistent with abnormal left ventricular relaxation (grade 1 diastolic dysfunction). - Mitral valve: Moderately to severely calcified annulus. Mild to moderate regurgitation directed centrally. - Pericardium, extracardiac: A trivial pericardial effusion was identified.  Comparison with May 11, 2010 study shows partial improvement in inferolateral wall function and subsequent improvement in overall LV systolic function.   ECG:  06-Jun-2014 Normal sinus rhythm Left axis deviation Left ventricular hypertrophy Lateral T wave flattening, improved from ECG dated 2012 Vent. rate 64 BPM PR interval 180 ms QRS duration 106 ms QT/QTc 430/443 ms P-R-T axes 53 -30 101  Cardiac Cath: 08/18/2011 Coronary angiography:  Coronary dominance: Right  Left Main: The left main overall is very short. The LAD and circumflex have basically 2 separate ostium.  Left Anterior Descending (LAD): The vessel is normal in size and mildly to moderately calcified. There is mild diffuse 20% disease proximally. The rest of the vessel is free of significant disease.  1st diagonal (D1): This vessel is overall large with an 80% tubular stenosis in the proximal segment which is unchanged from previous cardiac catheterization.  2nd diagonal (D2): Small in size with mild ostial disease.  3rd diagonal (D3): Very small in size and free of significant disease.  Circumflex (LCx): The vessel is overall large in size and nondominant. A long segment the stented area is noted in the proximal and mid left circumflex. There is mild 30% restenosis in the proximal segment.  1st obtuse marginal: Small in size and free of significant disease.  2nd obtuse marginal: Normal in size and free of significant disease  3rd obtuse marginal: Large size and free of significant disease.  The posterior AV groove is normal in size with mild ostial and proximal disease. It gives 1 posterior lateral branch which is free of significant disease.  Right Coronary Artery: The vessel is medium in size. It is moderately calcified overall. There is diffuse 50-60% disease proximally which does not appear to be changed from previous catheterization. The mid segment has mild diffuse 20% disease. There is minor irregularities  distally.  posterior descending artery: The vessel is normal normal in size without any significant disease.  posterior lateral branch: Small in size and free of significant disease. Left ventriculography: Left ventricular systolic function is mildly reduced , LVEF is estimated at 40 %, there is no significant  mitral regurgitation . There is mild global hypokinesis.  Final Conclusions: 1. known significant 2 vessel coronary artery disease with patent stents in the left circumflex with only mild restenosis. No significant obstructive disease in the left anterior descending artery. The stenosis in first diagonal is unchanged from most recent catheterization. The RCA has moderate diffuse disease overall. No culprit lesion is identified for unstable angina.  2. Mildly reduced LV systolic function with an estimated ejection fraction of 40%.  3. Mildly elevated left ventricular end-diastolic pressure.  Recommendations: There is no evidence of plaque rupture on current angiogram. The ejection fraction is reduced but does not seem to be consistent with stress-induced cardiomyopathy. Agree with evaluation for a possible neurologic etiology. Continue treatment for coronary artery disease and heart failure. Resume heart failure medications once her pressure is not low.  Radiology:  Dg Chest 2 View 06/06/2014   CLINICAL DATA:  Shortness of breath.  Chest pain.  EXAM: CHEST  2 VIEW  COMPARISON:  08/21/2011  FINDINGS: There is borderline cardiomegaly. Pulmonary vascularity is normal. The lungs are clear. No effusions. Calcification in the mitral valve annulus. No osseous abnormality of significance.  IMPRESSION: Borderline cardiomegaly.  No acute abnormalities.   Electronically Signed   By: Rozetta Nunnery M.D.   On: 06/06/2014 09:43    ASSESSMENT AND PLAN:   The patient was seen today by Dr. Acie Fredrickson, the patient evaluated and the data reviewed.   Principal Problem:   Chest pain - atypical, and associated with  palpitations. Agree with admission to rule out MI, as her dyspnea on exertion may be an anginal equivalent. M.D. to assess patient and advise on cath versus stress test if her cardiac enzymes remain negative. Currently on aspirin, Lipitor 20 mg, and Coreg 6.25 mg twice a day  Active Problems:   Diabetes mellitus - per IM    Hyperlipidemia - per IM, last profile in the system is from 2013, she had significant hyperlipidemia at that time. Recheck    Essential hypertension - one reading was 546 systolic, possibly inaccurate, other readings 170s-180s. She has not had her home medications today. She is bradycardic with heart rates in the 50s at times, so no dose change on beta blocker but will continue at the current dose.    CAD (coronary artery disease), Ant MI, CFX DES 9/11, no ISR or progression at cath 2012 - see above      GERD (gastroesophageal reflux disease) - per IM    Palpitation  - brief and not captured on telemetry. Keep on telemetry overnight, continue home dose of beta blocker, and consider event monitor after discharge. If she is having atrial fibrillation, we'll have to be careful with anti-coagulation because of her history of a lower GI bleed.   SignedRosaria Ferries, PA-C 06/06/2014 12:47 PM Beeper [phone]  Co-Sign MD   Attending Note:   The patient was seen and examined.  Agree with assessment and plan as noted above.  Changes made to the above note as needed.  Pt has hx of CAD. Presented today with palpitations associated with CP and ? Dyspnea. She says it felt similar to her previous episodes of angina.  Did not have NTG to take .  Exam is unremarkable.  POC  Troponin is normal .   Will do a Lexiscan myoview.  She may have a small area of ischemia associated with her diagonal stenosis.    If she has a large area of ischemia, she may need a cath.  She is currently pain free.   Thayer Headings, Brooke Bonito., MD, [facility] 06/06/2014, 3:16 PM [address]   [place] Pager [phone]

## 2014-06-07 ENCOUNTER — Observation Stay (HOSPITAL_COMMUNITY): Payer: Medicare HMO

## 2014-06-07 DIAGNOSIS — R002 Palpitations: Secondary | ICD-10-CM

## 2014-06-07 DIAGNOSIS — R079 Chest pain, unspecified: Secondary | ICD-10-CM

## 2014-06-07 LAB — HEPATIC FUNCTION PANEL
ALBUMIN: 3 g/dL — AB (ref 3.5–5.2)
ALK PHOS: 69 U/L (ref 39–117)
ALT: 10 U/L (ref 0–35)
AST: 15 U/L (ref 0–37)
Bilirubin, Direct: <0.2 mg/dL (ref 0.0–0.3)
Total Bilirubin: 0.3 mg/dL (ref 0.3–1.2)
Total Protein: 6.3 g/dL (ref 6.0–8.3)

## 2014-06-07 LAB — GLUCOSE, CAPILLARY
GLUCOSE-CAPILLARY: 118 mg/dL — AB (ref 70–99)
GLUCOSE-CAPILLARY: 167 mg/dL — AB (ref 70–99)
Glucose-Capillary: 173 mg/dL — ABNORMAL HIGH (ref 70–99)

## 2014-06-07 LAB — TROPONIN I: Troponin I: <0.3 ng/mL (ref <0.30)

## 2014-06-07 MED ORDER — ATORVASTATIN CALCIUM 20 MG PO TABS
20.0000 mg | ORAL_TABLET | Freq: Every day | ORAL | Status: DC
  Administered 2014-06-07: 20 mg via ORAL
  Filled 2014-06-07 (×2): qty 1

## 2014-06-07 MED ORDER — SODIUM CHLORIDE 0.9 % IV SOLN: 250.0000 mL | INTRAVENOUS | Status: DC | PRN

## 2014-06-07 MED ORDER — SODIUM CHLORIDE 0.9 % IJ SOLN: 3.0000 mL | INTRAMUSCULAR | Status: DC | PRN

## 2014-06-07 MED ORDER — REGADENOSON 0.4 MG/5ML IV SOLN
0.4000 mg | Freq: Once | INTRAVENOUS | Status: AC
  Administered 2014-06-07: 0.4 mg via INTRAVENOUS
  Filled 2014-06-07: qty 5

## 2014-06-07 MED ORDER — SODIUM CHLORIDE 0.9 % IJ SOLN
3.0000 mL | Freq: Two times a day (BID) | INTRAMUSCULAR | Status: DC
  Administered 2014-06-07: 3 mL via INTRAVENOUS

## 2014-06-07 MED ORDER — HYDRALAZINE HCL 20 MG/ML IJ SOLN
10.0000 mg | Freq: Three times a day (TID) | INTRAMUSCULAR | Status: DC | PRN
  Administered 2014-06-07: 08:00:00 via INTRAVENOUS

## 2014-06-07 MED ORDER — REGADENOSON 0.4 MG/5ML IV SOLN
INTRAVENOUS | Status: AC
  Administered 2014-06-07: 0.4 mg via INTRAVENOUS
  Filled 2014-06-07: qty 5

## 2014-06-07 MED ORDER — SODIUM CHLORIDE 0.9 % IJ SOLN
80.0000 mg | INTRAVENOUS | Status: DC
  Administered 2014-06-07: 40 mg via INTRAVENOUS

## 2014-06-07 MED ORDER — ASPIRIN 81 MG PO CHEW
81.0000 mg | CHEWABLE_TABLET | ORAL | Status: AC
  Administered 2014-06-08: 81 mg via ORAL
  Filled 2014-06-07: qty 1

## 2014-06-07 MED ORDER — HYDRALAZINE HCL 20 MG/ML IJ SOLN
INTRAMUSCULAR | Status: AC
  Filled 2014-06-07: qty 1

## 2014-06-07 MED ORDER — TECHNETIUM TC 99M SESTAMIBI GENERIC - CARDIOLITE
30.0000 | Freq: Once | INTRAVENOUS | Status: AC | PRN
  Administered 2014-06-07: 30 via INTRAVENOUS

## 2014-06-07 MED ORDER — FUROSEMIDE 40 MG PO TABS
40.0000 mg | ORAL_TABLET | Freq: Every day | ORAL | Status: DC
  Administered 2014-06-07: 40 mg via ORAL
  Filled 2014-06-07 (×2): qty 1

## 2014-06-07 MED ORDER — ATORVASTATIN CALCIUM 40 MG PO TABS
40.0000 mg | ORAL_TABLET | Freq: Every day | ORAL | Status: DC
  Filled 2014-06-07: qty 1

## 2014-06-07 MED ORDER — SODIUM CHLORIDE 0.9 % IV SOLN
INTRAVENOUS | Status: DC
  Administered 2014-06-08: 06:00:00 via INTRAVENOUS

## 2014-06-07 MED ORDER — TECHNETIUM TC 99M SESTAMIBI GENERIC - CARDIOLITE
10.0000 | Freq: Once | INTRAVENOUS | Status: AC | PRN
  Administered 2014-06-07: 10 via INTRAVENOUS

## 2014-06-07 NOTE — Progress Notes (Addendum)
Patient Name: Mary Cook Date of Encounter: 06/07/2014  Principal Problem:   Chest pain Active Problems:   Rapid palpitations   Diabetes mellitus   Hyperlipidemia   Essential hypertension   CAD (coronary artery disease), Ant MI, CFX DES 9/11, no ISR or progression at cath 2012   GERD (gastroesophageal reflux disease)    Patient Profile: 80 y.o. female with a history of CAD, HTN, DM, HLD and CVA. She had a stent to the CFX in 2011 in the setting of a STEMI, repeat cath 07/2011, med rx recommended for non-critical multi-vessel disease. Admitted 10/12 for chest pain, palpitations.   SUBJECTIVE: Mid sternal CP this morning, 3/10.  Resolved while in [place] [facility].    OBJECTIVE Filed Vitals:   06/06/14 1332 06/06/14 2100 06/07/14 0509 06/07/14 0817  BP: 160/40 153/56 156/52 182/77  Pulse: 62 66 60   Temp: 98.1 F (36.7 C) 98.4 F (36.9 C) 98.1 F (36.7 C)   TempSrc: Oral Oral Oral   Resp: 17 18 18    Height:      Weight:   162 lb 0.6 oz (73.5 kg)   SpO2: 99% 98% 96%     Intake/Output Summary (Last 24 hours) at 06/07/14 0828 Last data filed at 06/06/14 1300  Gross per 24 hour  Intake    240 ml  Output      0 ml  Net    240 ml   Filed Weights   06/06/14 0917 06/07/14 0509  Weight: 162 lb (73.483 kg) 162 lb 0.6 oz (73.5 kg)    PHYSICAL EXAM General: Well developed, well nourished, female in no acute distress. Head: Normocephalic, atraumatic.  Lungs:  Resp regular and unlabored, CTA. Heart: RRR, S1, S2, no S3, S4, or murmur; no rub. Extremities: No clubbing, cyanosis, edema. 2+ radials and DPs Neuro: Alert and oriented X 3. Moves all extremities spontaneously. Psych: Normal affect.  LABS: CBC:  Recent Labs  06/06/14 0945  WBC 4.9  HGB 12.6  HCT 37.9  MCV 90.2  PLT 546   Basic Metabolic Panel:  Recent Labs  06/06/14 0945  NA 142  K 3.5*  CL 101  CO2 28  GLUCOSE 149*  BUN 20  CREATININE 1.23*  CALCIUM 9.2  LFTs pending  Cardiac  Enzymes:  Recent Labs  06/06/14 1421 06/06/14 2008 06/07/14 0220  TROPONINI <0.30 <0.30 <0.30    Recent Labs  06/06/14 0950  TROPIPOC 0.01   Hemoglobin A1C:  Recent Labs  06/06/14 0945  HGBA1C 6.7*   Fasting Lipid Panel:  Recent Labs  06/06/14 1421  CHOL 244*  HDL 73  LDLCALC 151*  TRIG 99  CHOLHDL 3.3   TELE:  SR, PVCs and pairs, episode narrow-complex tachycardia, likely PAT  Radiology/Studies: Dg Chest 2 View  06/06/2014   CLINICAL DATA:  Shortness of breath.  Chest pain.  EXAM: CHEST  2 VIEW  COMPARISON:  08/21/2011  FINDINGS: There is borderline cardiomegaly. Pulmonary vascularity is normal. The lungs are clear. No effusions. Calcification in the mitral valve annulus. No osseous abnormality of significance.  IMPRESSION: Borderline cardiomegaly.  No acute abnormalities.   Electronically Signed   By: Rozetta Nunnery M.D.   On: 06/06/2014 09:43     Current Medications:  . amLODipine  5 mg Oral Daily  . aspirin EC  81 mg Oral Daily  . atorvastatin  20 mg Oral Daily  . carvedilol  6.25 mg Oral BID WC  . citalopram  20 mg Oral Daily  .  enoxaparin (LOVENOX) injection  30 mg Subcutaneous Q24H  . fluticasone  2 spray Each Nare Daily  . insulin aspart  0-5 Units Subcutaneous QHS  . insulin aspart  0-9 Units Subcutaneous TID WC  . pantoprazole  40 mg Oral Daily  . potassium chloride SA  20 mEq Oral BID      ASSESSMENT AND PLAN: Principal Problem:   Chest pain Active Problems:   Rapid palpitations   Diabetes mellitus   Hyperlipidemia   Essential hypertension   CAD (coronary artery disease), Ant MI, CFX DES 9/11, no ISR or progression at cath 2012   GERD (gastroesophageal reflux disease)  Plan:  Lexiscan completed this morning.  The patient did not tolerate the test very well.  She became severely dyspneic.  Improved after aminophylline given.  She has ruled out for MI.  Lipids very elevated.  On lipitor 20mg .   IV hydralazine given prior to stress portion  which improved her BP.    Signed, Tarri Fuller, PA-C 8:28 AM 06/07/2014  Personally seen and examined. Agree with above. If nuclear stress low risk OK to DC.  If intermediate or high risk, cath.  Discussed with her. She is concerned about husband with what sounds like dementia.  Candee Furbish, MD

## 2014-06-07 NOTE — Progress Notes (Signed)
Stress test reveals a large area of moderate-intensity mostly reversible (SDS 8) inferior,  apical and lateral ischemia.  Will schedule for left heart cath tomorrow.   It will be around 0900hrs with Dr. Claiborne Billings.  Aylene Acoff, PA-C

## 2014-06-07 NOTE — Progress Notes (Signed)
[facility] PROGRESS NOTE  Mary Cook UYQ:[id number] DOB: 10/06/1925 DOA: 06/06/2014 PCP: Walker Kehr, MD  Brief Narrative: Patient is 80 year old female with history of CAD, hypertension, hyperlipidemia, GERD, diabetes, CHF, chronic systolic with EF of 63-87%, last echo in 04/2010 presented to ED with chest pain episode. History was obtained from the patient reported that she was in her normal state of health until 9 AM 10/12, she noticed some palpitations followed by chest pain, midsternal lasting for about 5 minutes. She underwent a stress test today which was significantly abnormal  Assessment/Plan: 1. CAD with chest pain -h/o Ant MI, CFX DES 9/11, no ISR or progression at cath 2012  -Myoview this afternoon withLarge area of moderate-intensity mostly reversible inferior, apical and lateral ischemia. Dilated LV with reduced systolic function - EF was 32% -Continue ASA/coreg/statin -further plans -LHC per Cards  2. H/o ICM EF was 40% now lower  -compensated, continue Coreg/Po Lasix  3. Diabetes mellitus  -FU hemoglobin A1c, stable, continue SSI  4.  Hyperlipidemia  -  LDL 150, increased statin dose to 40mg   5. CKD 3 -stable, resume lasix, hold ACE pending LHC  6. GERD (gastroesophageal reflux disease)  -Continue PPI   DVT prophylaxis: Lovenox  Code Status: FUll Code Family Communication: none at bedside Disposition Plan: home pending workup   Consultants:  Cardiology  HPI/Subjective: Feels better, now, had chest pain and dyspnea during the stress test  Objective: Filed Vitals:   06/07/14 1416  BP: 124/70  Pulse: 62  Temp: 98.2 F (36.8 C)  Resp: 18    Intake/Output Summary (Last 24 hours) at 06/07/14 1534 Last data filed at 06/07/14 1416  Gross per 24 hour  Intake    240 ml  Output      0 ml  Net    240 ml   Filed Weights   06/06/14 0917 06/07/14 0509  Weight: 73.483 kg (162 lb) 73.5 kg (162 lb 0.6 oz)    Exam:   General:   AAOx3  Cardiovascular: S1S2/RRR  Respiratory: CTAB  Abdomen: soft, Nt, BS present  Musculoskeletal: no edema c/c   Data Reviewed: Basic Metabolic Panel:  Recent Labs Lab 06/06/14 0945  NA 142  K 3.5*  CL 101  CO2 28  GLUCOSE 149*  BUN 20  CREATININE 1.23*  CALCIUM 9.2   Liver Function Tests:  Recent Labs Lab 06/07/14 0220  AST 15  ALT 10  ALKPHOS 69  BILITOT 0.3  PROT 6.3  ALBUMIN 3.0*   No results found for this basename: LIPASE, AMYLASE,  in the last 168 hours No results found for this basename: AMMONIA,  in the last 168 hours CBC:  Recent Labs Lab 06/06/14 0945  WBC 4.9  HGB 12.6  HCT 37.9  MCV 90.2  PLT 185   Cardiac Enzymes:  Recent Labs Lab 06/06/14 1421 06/06/14 2008 06/07/14 0220  TROPONINI <0.30 <0.30 <0.30   BNP (last 3 results) No results found for this basename: PROBNP,  in the last 8760 hours CBG:  Recent Labs Lab 06/06/14 1656 06/06/14 2125 06/07/14 1215  GLUCAP 160* 134* 173*    No results found for this or any previous visit (from the past 240 hour(s)).   Studies: Dg Chest 2 View  06/06/2014   CLINICAL DATA:  Shortness of breath.  Chest pain.  EXAM: CHEST  2 VIEW  COMPARISON:  08/21/2011  FINDINGS: There is borderline cardiomegaly. Pulmonary vascularity is normal. The lungs are clear. No effusions. Calcification in the mitral valve annulus.  No osseous abnormality of significance.  IMPRESSION: Borderline cardiomegaly.  No acute abnormalities.   Electronically Signed   By: Rozetta Nunnery M.D.   On: 06/06/2014 09:43   Nm Myocar Multi W/spect W/wall Motion / Ef  06/07/2014   HISTORY OF PRESENT ILLNESS: Nuclear Med Background  Indication for Stress Test:  Chest pain  History:  is an 80 y.o. female with a history of CAD, HTN, DM, HLD and CVA. She had a stent to the CFX in 2011 in the setting of a STEMI. She had repeat catheterization in 07/2011, results below. Medical therapy was recommended for non-critical multi-vessel disease.  She has a long history of brief palpitations, lasting only a few seconds. There are no symptoms associated with this. She has noticed a weight gain of approximately 7 pounds over the last several weeks. She feels like she is eating more but states she also thinks she put on some fluid. She has noticed in increase in her dyspnea on exertion, stating she will get short of breath walking room-room in the house. She denies orthopnea but has been waking up at night feeling hot, short of breath and diaphoretic. She put this down to hot flashes but admits that she hasn't had hot flashes in > 10 years and they were not associated with shortness of breath. Today, she had palpitations that lasted between 5 and 10 minutes. After the palpitations started, she got a few seconds of very sharp and very deep chest pain. She has never had chest pain before. It resolved by itself. She had not taken her morning medications when the palpitations began. She did not take any medications for the palpitations. They resolved without any intervention. The palpitations made her feel short of breath but there was no presyncope or syncope. In the emergency room, she has not had recurrence of chest pain or the palpitations. She has not had any illnesses, fevers or chills. She has a remote history of GI bleed in 2002 that was felt to be diverticular, but has no ongoing GI issues and no recent history of bleeding or dark, tarry stools.  Cardiac Risk Factors:  CAD, DM2, HTN, HPL, stroke  Symptoms:  Chest pain  PROCEDURE: Nuclear Pre-Procedure  Caffeine/Decaff Intake:  NPO After: MN.  Lungs:  O2 Stat:  IV 0.9% NS with Angio Cath:  Chest Size (in):  Cup Size:  Height: 5'5"  Weight:  162 lb  BMI:  Tech Comments:  Nuclear Med Study  1 or 2 day study: 1  Stress Test Type: Lexiscan  Reading MD: Hilty  Order Authorizing Provider: Barrett  Resting Radionuclide: Sestamibi  Resting Radionuclide Dose:  10 mCi  Stress Radionuclide: Sestamibi  Stress  Radionuclide Dose:  30 mCi  Stress Protocol  Rest HR: 70  Stress HR: 69  Rest BP: 168/66  Stress BP: 142/58  Exercise Time (min):  METS:  Dose of Adenosine (mg):  Dose of Lexiscan:  0.4 mg  Dose of Atropine (mg):  Dose of Dobutamine:  Stress Test Technologist:  Nuclear Technologist:  Rest Procedure:  Stress Procedure:  Transient Ischemic Dilatation (Normal <1.22): 1.05  Lung/Heart Ratio (Normal <0.45): 0.08  QGS EDV:  188 ml  QGS ESV:  127 ml  LV Ejection Fraction: 32%  Rest ECG: NSR  Stress ECG: NSR  Raw Data Images:  No artifacts  Stress Images: Large area of reversible inferior, apical, lateral ischemia  Rest Images:  Small area of decreased perfusion laterally  Subtraction (SDS):  IMPRESSION: Exercise Capacity:  none  BP Response: normal  Clinical Symptoms: none  ECG Impression:  No lexiscan EKG changes  Comparison with Prior Nuclear Study: none  Final Impression:  Large area of moderate-intensity mostly reversible (SDS 8) inferior, apical and lateral ischemia. Dilated LV with reduced systolic function - gating errors were noted, the calculated EF was 32%, however, that may be falsely low.   Electronically Signed   By: Pixie Casino   On: 06/07/2014 15:24    Scheduled Meds: . amLODipine  5 mg Oral Daily  . aspirin EC  81 mg Oral Daily  . atorvastatin  20 mg Oral Daily  . carvedilol  6.25 mg Oral BID WC  . citalopram  20 mg Oral Daily  . enoxaparin (LOVENOX) injection  30 mg Subcutaneous Q24H  . fluticasone  2 spray Each Nare Daily  . furosemide  40 mg Oral Daily  . insulin aspart  0-5 Units Subcutaneous QHS  . insulin aspart  0-9 Units Subcutaneous TID WC  . pantoprazole  40 mg Oral Daily  . potassium chloride SA  20 mEq Oral BID   Continuous Infusions:  Antibiotics Given (last 72 hours)   None      Principal Problem:   Chest pain Active Problems:   Diabetes mellitus   Hyperlipidemia   Essential hypertension   CAD (coronary artery disease), Ant MI, CFX DES 9/11, no ISR or  progression at cath 2012   GERD (gastroesophageal reflux disease)   Rapid palpitations    Time spent: 47min    Mary Cook  [facility] Pager [phone]. If 7PM-7AM, please contact night-coverage at [email or web address], password [facility] 06/07/2014, 3:34 PM  LOS: 1 day

## 2014-06-07 NOTE — Progress Notes (Signed)
UR completed 

## 2014-06-07 NOTE — Progress Notes (Signed)
Pt has recently been brought down from her 3W27 to [facility] for Lexi stress test.  She has already been injected with the imaging agent.  She is now complaining of midsternal chest pain 3/10 and intermittant "head pains".  She states this has just started.  BP checks are 182/77, 185/69/184/73 over about 5 minutes.  Spoke with pt's RN and she stated the pt was not complaining of his prior to leaving the unit.  Paged Faythe Casa, PA for cardiology and informed him of above.  He states its OK to go ahead and put pt under the camera and he will write an order for Hydralazine.  Addendum 0820  Pt given 10 mg Hydralazine with BP afterwards 168/66.  Aaron Edelman Heger to do stress test soon.

## 2014-06-08 ENCOUNTER — Encounter (HOSPITAL_COMMUNITY)
Admission: EM | Disposition: A | Discharge status: Home / Self Care | Payer: Self-pay | Source: Home / Self Care | Attending: Emergency Medicine

## 2014-06-08 DIAGNOSIS — E785 Hyperlipidemia, unspecified: Secondary | ICD-10-CM

## 2014-06-08 HISTORY — PX: LEFT HEART CATHETERIZATION WITH CORONARY ANGIOGRAM: SHX5451

## 2014-06-08 LAB — BASIC METABOLIC PANEL
Anion gap: 11 (ref 5–15)
BUN: 21 mg/dL (ref 6–23)
CO2: 25 mEq/L (ref 19–32)
CREATININE: 1.29 mg/dL — AB (ref 0.50–1.10)
Calcium: 9.3 mg/dL (ref 8.4–10.5)
Chloride: 103 mEq/L (ref 96–112)
GFR, EST AFRICAN AMERICAN: 42 mL/min — AB (ref >90)
GFR, EST NON AFRICAN AMERICAN: 36 mL/min — AB (ref >90)
Glucose, Bld: 138 mg/dL — ABNORMAL HIGH (ref 70–99)
Potassium: 4.3 mEq/L (ref 3.7–5.3)
Sodium: 139 mEq/L (ref 137–147)

## 2014-06-08 LAB — PROTIME-INR
INR: 0.97 (ref 0.00–1.49)
Prothrombin Time: 12.9 seconds (ref 11.6–15.2)

## 2014-06-08 LAB — CBC
HCT: 36.2 % (ref 36.0–46.0)
HEMOGLOBIN: 12.1 g/dL (ref 12.0–15.0)
MCH: 30 pg (ref 26.0–34.0)
MCHC: 33.4 g/dL (ref 30.0–36.0)
MCV: 89.8 fL (ref 78.0–100.0)
PLATELETS: 185 10*3/uL (ref 150–400)
RBC: 4.03 MIL/uL (ref 3.87–5.11)
RDW: 12.5 % (ref 11.5–15.5)
WBC: 5.4 10*3/uL (ref 4.0–10.5)

## 2014-06-08 LAB — GLUCOSE, CAPILLARY
GLUCOSE-CAPILLARY: 142 mg/dL — AB (ref 70–99)
GLUCOSE-CAPILLARY: 128 mg/dL — AB (ref 70–99)
Glucose-Capillary: 139 mg/dL — ABNORMAL HIGH (ref 70–99)
Glucose-Capillary: 186 mg/dL — ABNORMAL HIGH (ref 70–99)

## 2014-06-08 SURGERY — LEFT HEART CATHETERIZATION WITH CORONARY ANGIOGRAM
Anesthesia: LOCAL

## 2014-06-08 MED ORDER — FUROSEMIDE 40 MG PO TABS: 40.0000 mg | ORAL_TABLET | Freq: Every day | ORAL | Status: DC

## 2014-06-08 MED ORDER — ACETAMINOPHEN 325 MG PO TABS: 650.0000 mg | ORAL_TABLET | ORAL | Status: DC | PRN

## 2014-06-08 MED ORDER — ISOSORBIDE MONONITRATE ER 30 MG PO TB24
30.0000 mg | ORAL_TABLET | Freq: Every day | ORAL | Status: DC
  Administered 2014-06-08: 30 mg via ORAL
  Filled 2014-06-08: qty 1

## 2014-06-08 MED ORDER — MIDAZOLAM HCL 2 MG/2ML IJ SOLN
INTRAMUSCULAR | Status: AC
  Filled 2014-06-08: qty 2

## 2014-06-08 MED ORDER — ISOSORBIDE MONONITRATE ER 30 MG PO TB24: 30.0000 mg | ORAL_TABLET | Freq: Every day | ORAL | Status: DC

## 2014-06-08 MED ORDER — ASPIRIN EC 81 MG PO TBEC: 81.0000 mg | DELAYED_RELEASE_TABLET | Freq: Every day | ORAL | Status: DC

## 2014-06-08 MED ORDER — HYDRALAZINE HCL 20 MG/ML IJ SOLN
INTRAMUSCULAR | Status: AC
  Filled 2014-06-08: qty 1

## 2014-06-08 MED ORDER — ATORVASTATIN CALCIUM 40 MG PO TABS: 40.0000 mg | ORAL_TABLET | Freq: Every day | ORAL | Status: DC

## 2014-06-08 MED ORDER — SODIUM CHLORIDE 0.9 % IV SOLN: INTRAVENOUS | Status: DC

## 2014-06-08 MED ORDER — ONDANSETRON HCL 4 MG/2ML IJ SOLN
4.0000 mg | Freq: Four times a day (QID) | INTRAMUSCULAR | Status: DC | PRN
Start: 2014-06-08 — End: 2014-06-08

## 2014-06-08 MED ORDER — SODIUM CHLORIDE 0.9 % IV SOLN
INTRAVENOUS | Status: DC
  Administered 2014-06-08: 12:00:00 via INTRAVENOUS

## 2014-06-08 MED ORDER — FENTANYL CITRATE 0.05 MG/ML IJ SOLN
INTRAMUSCULAR | Status: AC
  Filled 2014-06-08: qty 2

## 2014-06-08 MED ORDER — LIDOCAINE HCL (PF) 1 % IJ SOLN
INTRAMUSCULAR | Status: AC
  Filled 2014-06-08: qty 30

## 2014-06-08 MED ORDER — HEPARIN (PORCINE) IN NACL 2-0.9 UNIT/ML-% IJ SOLN
INTRAMUSCULAR | Status: AC
  Filled 2014-06-08: qty 1000

## 2014-06-08 MED ORDER — OXYCODONE HCL 5 MG PO TABS: 5.0000 mg | ORAL_TABLET | Freq: Once | ORAL | Status: DC

## 2014-06-08 MED ORDER — HYDRALAZINE HCL 20 MG/ML IJ SOLN
10.0000 mg | Freq: Once | INTRAMUSCULAR | Status: AC
  Administered 2014-06-08: 10 mg via INTRAVENOUS

## 2014-06-08 NOTE — Discharge Summary (Signed)
Discharge Summary  Mary Cook JJH:[id number] DOB: 1926/06/16  PCP: Walker Kehr, MD  Admit date: 06/06/2014 Discharge date: 06/08/2014  Time spent: 25 minutes  Recommendations for Outpatient Follow-up:  1. New medication: Imdur 30 mg by mouth daily 2. Patient will followup with her cardiologist in the next one month.   Discharge Diagnoses:  Active Hospital Problems   Diagnosis Date Noted  . Chest pain 06/06/2014  . Chronic combined systolic and diastolic congestive heart failure 06/08/2014  . Rapid palpitations 06/07/2014  . GERD (gastroesophageal reflux disease) 01/25/2013  . CAD (coronary artery disease), Ant MI, CFX DES 9/11, no ISR or progression at cath 2012 08/18/2011  . Hyperlipidemia 01/07/2008  . Diabetes mellitus 01/07/2008  . Essential hypertension 04/25/2007    Resolved Hospital Problems   Diagnosis Date Noted Date Resolved  No resolved problems to display.    Discharge Condition: Improved, being discharged home  Diet recommendation: Carb modified heart healthy  Filed Weights   06/06/14 0917 06/07/14 0509 06/08/14 0532  Weight: 73.483 kg (162 lb) 73.5 kg (162 lb 0.6 oz) 76.2 kg (167 lb 15.9 oz)    History of present illness:  80 year old female past medical history CAD, systolic CHF and hypertension presented to the emergency room on 10/12 with complaints of midsternal chest pain and palpitations that started several hours earlier that day. Initial troponins and EKG were unrevealing. Patient was admitted to the hospitalist service  Hospital Course:  Principal Problem:   Chest pain in patient with  CAD (coronary artery disease), and history of anterior MI: Patient seen by cardiology. She underwent stress test on 10/13 noting large area of moderate intensity, mostly reversible inferior apical and lateral ischemia with dilated left ventricular with reduced systolic function of around 35%. This was followed by cardiac catheterization on 10/14 which noted  a similar, but no areas of reversible ischemia. Was recommended to medically manage her. Imdur had been added to her medical regimen. In addition, her LDL was noted to be quite high at 150. Patient had not been taking her statin medication. This has been restarted. Active Problems:   Diabetes mellitus: Patient will continue on metformin. Her A1c noted excellent control at 6.7 and her CBGs have been under 150 since admission.   Hyperlipidemia: As stated above, patient noncompliant with her Lipitor. New prescription has been given for her.   Essential hypertension    GERD (gastroesophageal reflux disease): Continue on PPI   Rapid palpitations   Chronic combined systolic and diastolic congestive heart failure: Cardiac catheterization noted mild decrease from previous echocardiogram down to 35%. Patient will continue on Lasix plus ACE inhibitor and Coreg. She's currently euvolemic   Procedures: Cardiac catheterization done 10/14: Moderate left ventricular dysfunction with global hypokinesis, slightly more pronounced inferior hypocontractility an EF of 35%.Moderate coronary obstructive disease with evidence for 50-60% smooth eccentric stenosis in the first diagonal branch of the LAD; widely patent proximal tandem stents in the circumflex vessel with a focal 70% stenosis in a small AV groove circumflex; and 50-60% smooth stenosis in the proximal RCA with mild mid systolic bridging in the dominant RCA system.    Consultations:  Cardiology   Discharge Exam: BP 116/36  Pulse 71  Temp(Src) 98.1 F (36.7 C) (Oral)  Resp 17  Ht $R'5\' 5"'rH$  (1.651 m)  Wt 76.2 kg (167 lb 15.9 oz)  BMI 27.96 kg/m2  SpO2 97%  General: Alert and oriented x3, no acute distress Cardiovascular: Regular rate and rhythm, S1-S2 Respiratory: Clear to auscultation bilaterally  Discharge Instructions You were cared for by a hospitalist during your hospital stay. If you have any questions about your discharge medications or the  care you received while you were in the hospital after you are discharged, you can call the unit and asked to speak with the hospitalist on call if the hospitalist that took care of you is not available. Once you are discharged, your primary care physician will handle any further medical issues. Please note that NO REFILLS for any discharge medications will be authorized once you are discharged, as it is imperative that you return to your primary care physician (or establish a relationship with a primary care physician if you do not have one) for your aftercare needs so that they can reassess your need for medications and monitor your lab values.     Medication List         ACCU-CHEK AVIVA PLUS W/DEVICE Kit  Use device to check blood sugar twice daily     ACCU-CHEK SOFTCLIX LANCETS lancets  1 each by Other route 2 (two) times daily.     amLODipine 5 MG tablet  Commonly known as:  NORVASC  Take 1 tablet (5 mg total) by mouth daily.     aspirin 81 MG EC tablet  Take 81 mg by mouth daily.     atorvastatin 40 MG tablet  Commonly known as:  LIPITOR  Take 1 tablet (40 mg total) by mouth daily at 6 PM.     B-D SINGLE USE SWABS BUTTERFLY Pads  Use swabs to clean area to be tested twice daily     carvedilol 6.25 MG tablet  Commonly known as:  COREG  Take 1 tablet (6.25 mg total) by mouth 2 (two) times daily with a meal.     cholecalciferol 1000 UNITS tablet  Commonly known as:  VITAMIN D  Take 1 tablet (1,000 Units total) by mouth daily.     citalopram 20 MG tablet  Commonly known as:  CELEXA  Take 1 tablet (20 mg total) by mouth daily.     fluticasone 50 MCG/ACT nasal spray  Commonly known as:  FLONASE  Place 1 spray into both nostrils daily as needed for allergies or rhinitis.     furosemide 40 MG tablet  Commonly known as:  LASIX  Take 1 tablet (40 mg total) by mouth daily.     glucose blood test strip  Use twice daily as instructed. Dx: 250.00.     glucose blood test  strip  Commonly known as:  ACCU-CHEK AVIVA PLUS  Use to test blood sugar twice daily     isosorbide mononitrate 30 MG 24 hr tablet  Commonly known as:  IMDUR  Take 1 tablet (30 mg total) by mouth daily.     metFORMIN 500 MG tablet  Commonly known as:  GLUCOPHAGE  Take 1 tablet (500 mg total) by mouth 2 (two) times daily with a meal.     omeprazole 20 MG capsule  Commonly known as:  PRILOSEC  Take 20 mg by mouth daily.     potassium chloride SA 20 MEQ tablet  Commonly known as:  K-DUR,KLOR-CON  Take 1 tablet (20 mEq total) by mouth 2 (two) times daily.     ramipril 10 MG capsule  Commonly known as:  ALTACE  Take 1 capsule (10 mg total) by mouth daily.     traMADol 50 MG tablet  Commonly known as:  ULTRAM  Take 1-2 tablets (50-100 mg total) by mouth 2 (  two) times daily as needed for pain. 1-2 tabs bid prn for pain     Vitamin B-12 500 MCG Subl  Place 1 tablet (500 mcg total) under the tongue 1 day or 1 dose.       Allergies  Allergen Reactions  . Clopidogrel Bisulfate Nausea And Vomiting  . Lisinopril Cough       Follow-up Information   Follow up with Troy Sine, MD. (The office will call.)    Specialty:  Cardiology   Contact information:   [address] [place] [address] [phone]        The results of significant diagnostics from this hospitalization (including imaging, microbiology, ancillary and laboratory) are listed below for reference.    Significant Diagnostic Studies: Dg Chest 2 View  06/06/2014  .  IMPRESSION: Borderline cardiomegaly.  No acute abnormalities.   Electronically Signed   By: Rozetta Nunnery M.D.   On: 06/06/2014 09:43   Nm Myocar Multi W/spect W/wall Motion / Ef  06/07/2014    IMPRESSION: Exercise Capacity: none  BP Response: normal  Clinical Symptoms: none  ECG Impression:  No lexiscan EKG changes  Comparison with Prior Nuclear Study: none  Final Impression:  Large area of moderate-intensity mostly reversible  (SDS 8) inferior, apical and lateral ischemia. Dilated LV with reduced systolic function - gating errors were noted, the calculated EF was 32%, however, that may be falsely low.   Electronically Signed   By: Pixie Casino   On: 06/07/2014 15:24    Microbiology: No results found for this or any previous visit (from the past 240 hour(s)).   Labs: Basic Metabolic Panel:  Recent Labs Lab 06/06/14 0945 06/08/14 0400  NA 142 139  K 3.5* 4.3  CL 101 103  CO2 28 25  GLUCOSE 149* 138*  BUN 20 21  CREATININE 1.23* 1.29*  CALCIUM 9.2 9.3   Liver Function Tests:  Recent Labs Lab 06/07/14 0220  AST 15  ALT 10  ALKPHOS 69  BILITOT 0.3  PROT 6.3  ALBUMIN 3.0*   No results found for this basename: LIPASE, AMYLASE,  in the last 168 hours No results found for this basename: AMMONIA,  in the last 168 hours CBC:  Recent Labs Lab 06/06/14 0945 06/08/14 0400  WBC 4.9 5.4  HGB 12.6 12.1  HCT 37.9 36.2  MCV 90.2 89.8  PLT 185 185   Cardiac Enzymes:  Recent Labs Lab 06/06/14 1421 06/06/14 2008 06/07/14 0220  TROPONINI <0.30 <0.30 <0.30   BNP: BNP (last 3 results) No results found for this basename: PROBNP,  in the last 8760 hours CBG:  Recent Labs Lab 06/07/14 1635 06/07/14 2049 06/08/14 0751 06/08/14 1037 06/08/14 1125  GLUCAP 118* 167* 142* 128* 139*       Signed:  Rhylan Gross K  [facility] 06/08/2014, 2:54 PM

## 2014-06-08 NOTE — Progress Notes (Signed)
Site area: rt groin Site Prior to Removal:  Level 0 Pressure Applied For: 20 minutes Manual:   yes Patient Status During Pull:  stable Post Pull Site:  Level 0 Post Pull Instructions Given:  yes Post Pull Pulses Present: yes Dressing Applied:  tegaderm Bedrest begins @ 1100 Comments: no complications
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
## 2014-06-08 NOTE — Progress Notes (Signed)
Discharge education completed by RN. Pt and spouse received a copy of discharge paperwork and confirm understanding of follow up appointments and discharge medications. Both deny any questions at this time. IV removed, site is within normal limits. Pt will discharge from the unit via wheelchair. 

## 2014-06-08 NOTE — CV Procedure (Signed)
Mary Cook is a 80 y.o. female    [id number]  [id number] LOCATION:  FACILITY: [facility]  PHYSICIAN: Troy Sine, MD, [facility] 03-06-1926   DATE OF PROCEDURE:  06/08/2014    CARDIAC CATHETERIZATION     HISTORY:    Mary Cook is a 80 y.o. female who has known coronary artery disease, and suffered an left circumflex MI in 2011 treated with tandem stenting of her proximal circumflex vessel.  She also has known diagonal disease.  She has a history of hypertension, diabetes mellitus, hyperlipidemia, and remote CVA.  She was admitted to the hospital with increasing dyspnea on exertion.  A nuclear study was performed, which demonstrated inferior apical ischemia.  She is now referred for cardiac catheterization.   PROCEDURE:   The patient was brought to the [facility] cardiac catherization labaratory in the fasting state. She was premedicated with Versed 2 mg and fentanyl 25 mcg. Her right groin was prepped and shaved in usual sterile fashion. Xylocaine 1% was used for local anesthesia. A 5 French sheath was inserted into the R femoral artery. Diagnostic catheterizatiion was done with 5 [place] FL4, FR4, No Torque Right and pigtail catheters. Left ventriculography was done with 25 cc Omnipaque contrast. Hemostasis was obtained by direct manual compression. The patient tolerated the procedure well.   HEMODYNAMICS:   Central Aorta: 166/60   Left Ventricle: 169/13  ANGIOGRAPHY:  Fluoroscopy revealed coronary calcification and severe mitral annular calcification.  Left main: Moderate size vessel with an upward takeoff that bifurcated into the LAD and left circumflex vessel.  There was no stenosis.  LAD: Moderate size vessel which gave rise to a proximal diagonal and mid-diagonal vessel.  It was smooth 50-70% eccentric narrowing in the proximal portion of the first diagonal branch.  This was not significantly changed from the 2012 study.  Left circumflex: Calcified vessel with  widely patent previously placed 2 stents proximally, extending just beyond the ostium to just prior to the takeoff of the marginal branch.  The marginal vessel was bifurcating and was free of disease.  The AV groove circumflex was small caliber, and it 70% focal stenosis.  Right coronary artery: Large caliber dominant vessel that G.  It was 56% smooth narrowing with suggestion of mild mid systolic bridging.  The RCA gave rise to a large PDA vessel is otherwise free of significant disease.   Left ventriculography revealed moderate LV dysfunction with an ejection fraction of approximately 35%.  There was slightly more pronounced inferior hypocontractility relative to the anterolateral wall.   Contrast: 85 cc Omnipaque  IMPRESSION:  Moderate LV dysfunction with global hypokinesis and slightly more pronounced inferior hypocontractility and an ejection fraction of 35% in this patient status post remote left circumflex MI in 2011 treated with tandem stenting of the proximal circumflex vessel.  Moderate coronary obstructive disease with evidence for 50-60% smooth eccentric stenosis in the first diagonal branch of the LAD; widely patent proximal tandem stents in the circumflex vessel with a focal 70% stenosis in a small AV groove circumflex; and 50-60% smooth stenosis in the proximal RCA with mild mid systolic bridging in the dominant RCA system.  RECOMMENDATION:  Increase medical therapy will be recommended in this 80 year old female.    Troy Sine, MD, [facility] 06/08/2014 10:12 AM

## 2014-06-08 NOTE — H&P (View-Only) (Signed)
Stress test reveals a large area of moderate-intensity mostly reversible (SDS 8) inferior,  apical and lateral ischemia.  Will schedule for left heart cath tomorrow.   It will be around 0900hrs with Dr. Claiborne Billings.  Temperence Zenor, PA-C

## 2014-06-08 NOTE — Progress Notes (Signed)
Patient Name: Mary Cook Date of Encounter: 06/08/2014  Principal Problem:   Chest pain Active Problems:   Diabetes mellitus   Hyperlipidemia   Essential hypertension   CAD (coronary artery disease), Ant MI, CFX DES 9/11, no ISR or progression at cath 2012   GERD (gastroesophageal reflux disease)   Rapid palpitations    Patient Profile: 80 y.o. female with a history of CAD, HTN, DM, HLD and CVA. She had a stent to the CFX in 2011 in the setting of a STEMI, repeat cath 07/2011, med rx recommended for non-critical multi-vessel disease. Admitted 10/12 for chest pain, palpitations.   SUBJECTIVE: Post cath. Doing OK.   OBJECTIVE Filed Vitals:   06/08/14 1045 06/08/14 1050 06/08/14 1055 06/08/14 1100  BP: 152/54 155/50 172/51 160/49  Pulse: 62 63 64 63  Temp:      TempSrc:      Resp: 13 15 14 17   Height:      Weight:      SpO2: 97% 98% 97% 97%    Intake/Output Summary (Last 24 hours) at 06/08/14 1145 Last data filed at 06/08/14 0500  Gross per 24 hour  Intake    240 ml  Output      0 ml  Net    240 ml   Filed Weights   06/06/14 0917 06/07/14 0509 06/08/14 0532  Weight: 162 lb (73.483 kg) 162 lb 0.6 oz (73.5 kg) 167 lb 15.9 oz (76.2 kg)    PHYSICAL EXAM General: Well developed, well nourished, female in no acute distress. Head: Normocephalic, atraumatic.  Lungs:  Resp regular and unlabored, CTA. Heart: RRR, S1, S2, no S3, S4, or murmur; no rub. Extremities: No clubbing, cyanosis, edema. 2+ radials and DPs Neuro: Alert and oriented X 3. Moves all extremities spontaneously. Psych: Normal affect.  LABS: CBC:  Recent Labs  06/06/14 0945 06/08/14 0400  WBC 4.9 5.4  HGB 12.6 12.1  HCT 37.9 36.2  MCV 90.2 89.8  PLT 185 509   Basic Metabolic Panel:  Recent Labs  06/06/14 0945 06/08/14 0400  NA 142 139  K 3.5* 4.3  CL 101 103  CO2 28 25  GLUCOSE 149* 138*  BUN 20 21  CREATININE 1.23* 1.29*  CALCIUM 9.2 9.3  LFTs pending  Cardiac  Enzymes:  Recent Labs  06/06/14 1421 06/06/14 2008 06/07/14 0220  TROPONINI <0.30 <0.30 <0.30    Recent Labs  06/06/14 0950  TROPIPOC 0.01   Hemoglobin A1C:  Recent Labs  06/06/14 0945  HGBA1C 6.7*   Fasting Lipid Panel:  Recent Labs  06/06/14 1421  CHOL 244*  HDL 73  LDLCALC 151*  TRIG 99  CHOLHDL 3.3   TELE:  SR, PVCs and pairs, episode narrow-complex tachycardia, likely PAT  Radiology/Studies: Nm Myocar Multi W/spect W/wall Motion / Ef  06/07/2014   HISTORY OF PRESENT ILLNESS: Nuclear Med Background  Indication for Stress Test:  Chest pain  History:  is an 80 y.o. female with a history of CAD, HTN, DM, HLD and CVA. She had a stent to the CFX in 2011 in the setting of a STEMI. She had repeat catheterization in 07/2011, results below. Medical therapy was recommended for non-critical multi-vessel disease. She has a long history of brief palpitations, lasting only a few seconds. There are no symptoms associated with this. She has noticed a weight gain of approximately 7 pounds over the last several weeks. She feels like she is eating more but states she also thinks she put  on some fluid. She has noticed in increase in her dyspnea on exertion, stating she will get short of breath walking room-room in the house. She denies orthopnea but has been waking up at night feeling hot, short of breath and diaphoretic. She put this down to hot flashes but admits that she hasn't had hot flashes in > 10 years and they were not associated with shortness of breath. Today, she had palpitations that lasted between 5 and 10 minutes. After the palpitations started, she got a few seconds of very sharp and very deep chest pain. She has never had chest pain before. It resolved by itself. She had not taken her morning medications when the palpitations began. She did not take any medications for the palpitations. They resolved without any intervention. The palpitations made her feel short of breath but  there was no presyncope or syncope. In the emergency room, she has not had recurrence of chest pain or the palpitations. She has not had any illnesses, fevers or chills. She has a remote history of GI bleed in 2002 that was felt to be diverticular, but has no ongoing GI issues and no recent history of bleeding or dark, tarry stools.  Cardiac Risk Factors:  CAD, DM2, HTN, HPL, stroke  Symptoms:  Chest pain  PROCEDURE: Nuclear Pre-Procedure  Caffeine/Decaff Intake:  NPO After: MN.  Lungs:  O2 Stat:  IV 0.9% NS with Angio Cath:  Chest Size (in):  Cup Size:  Height: 5'5"  Weight:  162 lb  BMI:  Tech Comments:  Nuclear Med Study  1 or 2 day study: 1  Stress Test Type: Lexiscan  Reading MD: Hilty  Order Authorizing Provider: Barrett  Resting Radionuclide: Sestamibi  Resting Radionuclide Dose:  10 mCi  Stress Radionuclide: Sestamibi  Stress Radionuclide Dose:  30 mCi  Stress Protocol  Rest HR: 70  Stress HR: 69  Rest BP: 168/66  Stress BP: 142/58  Exercise Time (min):  METS:  Dose of Adenosine (mg):  Dose of Lexiscan:  0.4 mg  Dose of Atropine (mg):  Dose of Dobutamine:  Stress Test Technologist:  Nuclear Technologist:  Rest Procedure:  Stress Procedure:  Transient Ischemic Dilatation (Normal <1.22): 1.05  Lung/Heart Ratio (Normal <0.45): 0.08  QGS EDV:  188 ml  QGS ESV:  127 ml  LV Ejection Fraction: 32%  Rest ECG: NSR  Stress ECG: NSR  Raw Data Images:  No artifacts  Stress Images: Large area of reversible inferior, apical, lateral ischemia  Rest Images:  Small area of decreased perfusion laterally  Subtraction (SDS):  IMPRESSION: Exercise Capacity: none  BP Response: normal  Clinical Symptoms: none  ECG Impression:  No lexiscan EKG changes  Comparison with Prior Nuclear Study: none  Final Impression:  Large area of moderate-intensity mostly reversible (SDS 8) inferior, apical and lateral ischemia. Dilated LV with reduced systolic function - gating errors were noted, the calculated EF was 32%, however, that may be  falsely low.   Electronically Signed   By: Pixie Casino   On: 06/07/2014 15:24     Current Medications:  . amLODipine  5 mg Oral Daily  . aspirin EC  81 mg Oral Daily  . atorvastatin  20 mg Oral q1800  . atorvastatin  40 mg Oral q1800  . carvedilol  6.25 mg Oral BID WC  . citalopram  20 mg Oral Daily  . enoxaparin (LOVENOX) injection  30 mg Subcutaneous Q24H  . fluticasone  2 spray Each Nare Daily  . furosemide  40 mg Oral Daily  . insulin aspart  0-5 Units Subcutaneous QHS  . insulin aspart  0-9 Units Subcutaneous TID WC  . isosorbide mononitrate  30 mg Oral Daily  . pantoprazole  40 mg Oral Daily  . potassium chloride SA  20 mEq Oral BID   . sodium chloride    . sodium chloride      ASSESSMENT AND PLAN: Principal Problem:   Chest pain Active Problems:   Diabetes mellitus   Hyperlipidemia   Essential hypertension   CAD (coronary artery disease), Ant MI, CFX DES 9/11, no ISR or progression at cath 2012   GERD (gastroesophageal reflux disease)   Rapid palpitations  -Cath with moderate dz. Med mgt.  -Continue Imdur.  -OK for DC later today -Discussed with her. She is concerned about husband with what sounds like dementia.  Candee Furbish, MD

## 2014-06-08 NOTE — Interval H&P Note (Signed)
Cath Lab Visit (complete for each Cath Lab visit)  Clinical Evaluation Leading to the Procedure:   ACS: No.  Non-ACS:    Anginal Classification: CCS III  Anti-ischemic medical therapy: Maximal Therapy (2 or more classes of medications)  Non-Invasive Test Results: High-risk stress test findings: cardiac mortality >3%/year  Prior CABG: No previous CABG      History and Physical Interval Note:  06/08/2014 9:25 AM  Mary Cook  has presented today for surgery, with the diagnosis of positive nuclear stress test  The various methods of treatment have been discussed with the patient and family. After consideration of risks, benefits and other options for treatment, the patient has consented to  Procedure(s): LEFT HEART CATHETERIZATION WITH CORONARY ANGIOGRAM (N/A) as a surgical intervention .  The patient's history has been reviewed, patient examined, no change in status, stable for surgery.  I have reviewed the patient's chart and labs.  Questions were answered to the patient's satisfaction.     KELLY,THOMAS A

## 2014-06-09 ENCOUNTER — Telehealth: Payer: Self-pay | Admitting: Cardiovascular Disease

## 2014-06-09 NOTE — Telephone Encounter (Signed)
Closed encounter °

## 2014-06-16 ENCOUNTER — Encounter: Payer: Self-pay | Admitting: Cardiovascular Disease

## 2014-06-22 ENCOUNTER — Emergency Department (HOSPITAL_COMMUNITY): Payer: Medicare HMO

## 2014-06-22 ENCOUNTER — Encounter (HOSPITAL_COMMUNITY): Payer: Self-pay | Admitting: Emergency Medicine

## 2014-06-22 ENCOUNTER — Emergency Department (HOSPITAL_COMMUNITY)
Admission: EM | Admit: 2014-06-22 | Discharge: 2014-06-22 | Disposition: A | Discharge status: Home / Self Care | Payer: Medicare HMO | Attending: Emergency Medicine | Admitting: Emergency Medicine

## 2014-06-22 DIAGNOSIS — W01198A Fall on same level from slipping, tripping and stumbling with subsequent striking against other object, initial encounter: Secondary | ICD-10-CM | POA: Diagnosis not present

## 2014-06-22 DIAGNOSIS — M8580 Other specified disorders of bone density and structure, unspecified site: Secondary | ICD-10-CM | POA: Diagnosis not present

## 2014-06-22 DIAGNOSIS — G4733 Obstructive sleep apnea (adult) (pediatric): Secondary | ICD-10-CM | POA: Diagnosis not present

## 2014-06-22 DIAGNOSIS — Z9981 Dependence on supplemental oxygen: Secondary | ICD-10-CM | POA: Insufficient documentation

## 2014-06-22 DIAGNOSIS — Z79899 Other long term (current) drug therapy: Secondary | ICD-10-CM | POA: Diagnosis not present

## 2014-06-22 DIAGNOSIS — S0990XA Unspecified injury of head, initial encounter: Secondary | ICD-10-CM | POA: Diagnosis not present

## 2014-06-22 DIAGNOSIS — E119 Type 2 diabetes mellitus without complications: Secondary | ICD-10-CM | POA: Diagnosis not present

## 2014-06-22 DIAGNOSIS — I252 Old myocardial infarction: Secondary | ICD-10-CM | POA: Diagnosis not present

## 2014-06-22 DIAGNOSIS — W19XXXA Unspecified fall, initial encounter: Secondary | ICD-10-CM

## 2014-06-22 DIAGNOSIS — I2510 Atherosclerotic heart disease of native coronary artery without angina pectoris: Secondary | ICD-10-CM | POA: Diagnosis not present

## 2014-06-22 DIAGNOSIS — M1990 Unspecified osteoarthritis, unspecified site: Secondary | ICD-10-CM | POA: Diagnosis not present

## 2014-06-22 DIAGNOSIS — S20212A Contusion of left front wall of thorax, initial encounter: Secondary | ICD-10-CM | POA: Diagnosis not present

## 2014-06-22 DIAGNOSIS — E785 Hyperlipidemia, unspecified: Secondary | ICD-10-CM | POA: Diagnosis not present

## 2014-06-22 DIAGNOSIS — Z7982 Long term (current) use of aspirin: Secondary | ICD-10-CM | POA: Diagnosis not present

## 2014-06-22 DIAGNOSIS — Z9861 Coronary angioplasty status: Secondary | ICD-10-CM | POA: Insufficient documentation

## 2014-06-22 DIAGNOSIS — T1490XA Injury, unspecified, initial encounter: Secondary | ICD-10-CM

## 2014-06-22 DIAGNOSIS — I10 Essential (primary) hypertension: Secondary | ICD-10-CM | POA: Diagnosis not present

## 2014-06-22 DIAGNOSIS — Y9389 Activity, other specified: Secondary | ICD-10-CM | POA: Insufficient documentation

## 2014-06-22 DIAGNOSIS — Y9289 Other specified places as the place of occurrence of the external cause: Secondary | ICD-10-CM | POA: Diagnosis not present

## 2014-06-22 DIAGNOSIS — Z8673 Personal history of transient ischemic attack (TIA), and cerebral infarction without residual deficits: Secondary | ICD-10-CM | POA: Insufficient documentation

## 2014-06-22 DIAGNOSIS — Z8719 Personal history of other diseases of the digestive system: Secondary | ICD-10-CM | POA: Insufficient documentation

## 2014-06-22 DIAGNOSIS — S299XXA Unspecified injury of thorax, initial encounter: Secondary | ICD-10-CM | POA: Diagnosis present

## 2014-06-22 MED ORDER — HYDROXYZINE HCL 25 MG PO TABS: ORAL_TABLET | ORAL | Status: DC

## 2014-06-22 MED ORDER — TRAMADOL HCL 50 MG PO TABS: 50.0000 mg | ORAL_TABLET | Freq: Four times a day (QID) | ORAL | Status: DC | PRN

## 2014-06-22 NOTE — Discharge Instructions (Signed)
Follow up with your md next week if needed °

## 2014-06-22 NOTE — ED Notes (Addendum)
Pain lt ant chest after fall yesterday, "I think I broke a  Rib".   Hurts to move and take a breath.  Contusion to lt elbow

## 2014-06-22 NOTE — ED Provider Notes (Signed)
CSN: [id number]     Arrival date & time 06/22/14  1518 History  This chart was scribed for Mary Diego, MD by Delphia Grates, ED Scribe. This patient was seen in room [facility]18/[facility]18 and the patient's care was started at 4:42 PM.  Chief Complaint  Patient presents with  . Fall    Patient is a 80 y.o. female presenting with fall. The history is provided by the patient. No language interpreter was used.  Fall This is a new problem. The current episode started yesterday. The problem occurs rarely. The problem has not changed since onset.Pertinent negatives include no chest pain, no abdominal pain, no headaches and no shortness of breath. Exacerbated by: movement and deep breathing. Nothing relieves the symptoms. She has tried nothing for the symptoms.    HPI Comments: Mary Cook is a 80 y.o. female, with history of HTN, osteoarthritis, CAD, MI, and stroke, who presents to the Emergency Department complaining of fall that occurred ysterday. Patient reports she tripped and fell, landing on her left side, and striking her head. There is associated left rib pain and pain to the left side of the head. She reports the pain is worse with movement and deep breathing. Patient current takes 81 mg aspirin daily. She denies any other injuries.   Past Medical History  Diagnosis Date  . Hypertension   . Osteoarthritis   . Osteopenia   . Diverticulosis of colon   . Diabetes mellitus     type II  . Hyperlipidemia   . CAD (coronary artery disease)   . MI (myocardial infarction) 05/10/2010    Dr Claiborne Billings, Rx'd with CFX  DES  . OSA on CPAP   . Stroke 2002  . HYPERLIPIDEMIA 01/07/2008  . GIB (gastrointestinal bleeding) 2002    Diverticular   Past Surgical History  Procedure Laterality Date  . Abdominal hysterectomy  1980    no bso  . Coronary angioplasty with stent placement  05/11/2010    stent CFX  . Cardiac catheterization  07/2011    LAD [place], D1 80%, CFX 30% w/ patent stent, RCA 50-60%, EF  40%   Family History  Problem Relation Age of Onset  . Hypertension Other    History  Substance Use Topics  . Smoking status: Never Smoker   . Smokeless tobacco: Not on file  . Alcohol Use: No   OB History   Grav Para Term Preterm Abortions TAB SAB Ect Mult Living                 Review of Systems  Constitutional: Negative for appetite change and fatigue.  HENT: Negative for congestion, ear discharge and sinus pressure.   Eyes: Negative for discharge.  Respiratory: Negative for cough and shortness of breath.   Cardiovascular: Negative for chest pain.  Gastrointestinal: Negative for abdominal pain and diarrhea.  Genitourinary: Negative for frequency and hematuria.  Musculoskeletal: Positive for myalgias. Negative for back pain.  Skin: Negative for rash.  Neurological: Negative for seizures and headaches.  Psychiatric/Behavioral: Negative for hallucinations.      Allergies  Clopidogrel bisulfate and Lisinopril  Home Medications   Prior to Admission medications   Medication Sig Start Date End Date Taking? Authorizing Provider  ACCU-CHEK SOFTCLIX LANCETS lancets 1 each by Other route 2 (two) times daily. 04/27/14   Aleksei Plotnikov V, MD  Alcohol Swabs (B-D SINGLE USE SWABS BUTTERFLY) PADS Use swabs to clean area to be tested twice daily 04/27/14   Cassandria Anger, MD  amLODipine (NORVASC) 5 MG tablet Take 1 tablet (5 mg total) by mouth daily. 04/15/13 06/08/14  Aleksei Plotnikov V, MD  aspirin 81 MG EC tablet Take 81 mg by mouth daily.      Historical Provider, MD  atorvastatin (LIPITOR) 40 MG tablet Take 1 tablet (40 mg total) by mouth daily at 6 PM. 06/08/14   Annita Brod, MD  Blood Glucose Monitoring Suppl (ACCU-CHEK AVIVA PLUS) W/DEVICE KIT Use device to check blood sugar twice daily 04/27/14   Cassandria Anger, MD  carvedilol (COREG) 6.25 MG tablet Take 1 tablet (6.25 mg total) by mouth 2 (two) times daily with a meal. 04/15/13 06/08/14  Aleksei Plotnikov V, MD   cholecalciferol (VITAMIN D) 1000 UNITS tablet Take 1 tablet (1,000 Units total) by mouth daily. 09/09/11 06/08/14  Aleksei Plotnikov V, MD  citalopram (CELEXA) 20 MG tablet Take 1 tablet (20 mg total) by mouth daily. 12/09/11   Aleksei Plotnikov V, MD  Cyanocobalamin (VITAMIN B-12) 500 MCG SUBL Place 1 tablet (500 mcg total) under the tongue 1 day or 1 dose. 09/09/11   Aleksei Plotnikov V, MD  fluticasone (FLONASE) 50 MCG/ACT nasal spray Place 1 spray into both nostrils daily as needed for allergies or rhinitis.    Historical Provider, MD  furosemide (LASIX) 40 MG tablet Take 1 tablet (40 mg total) by mouth daily. 06/08/14   Annita Brod, MD  glucose blood (ACCU-CHEK AVIVA PLUS) test strip Use to test blood sugar twice daily 04/27/14   Aleksei Plotnikov V, MD  glucose blood test strip Use twice daily as instructed. Dx: 250.00. 04/29/13   Cassandria Anger, MD  isosorbide mononitrate (IMDUR) 30 MG 24 hr tablet Take 1 tablet (30 mg total) by mouth daily. 06/08/14   Annita Brod, MD  metFORMIN (GLUCOPHAGE) 500 MG tablet Take 1 tablet (500 mg total) by mouth 2 (two) times daily with a meal. 12/23/13 12/23/14  Aleksei Plotnikov V, MD  omeprazole (PRILOSEC) 20 MG capsule Take 20 mg by mouth daily.    Historical Provider, MD  potassium chloride SA (K-DUR,KLOR-CON) 20 MEQ tablet Take 1 tablet (20 mEq total) by mouth 2 (two) times daily. 12/23/13 12/23/14  Aleksei Plotnikov V, MD  ramipril (ALTACE) 10 MG capsule Take 1 capsule (10 mg total) by mouth daily. 04/15/13   Aleksei Plotnikov V, MD  traMADol (ULTRAM) 50 MG tablet Take 1-2 tablets (50-100 mg total) by mouth 2 (two) times daily as needed for pain. 1-2 tabs bid prn for pain 04/13/13   Lew Dawes V, MD   Triage Vitals: BP 105/80  Pulse 64  Temp(Src) 98.4 F (36.9 C) (Oral)  Resp 17  Ht _0  (1.651 m)  Wt 172 lb (78.019 kg)  BMI 28.62 kg/m2  SpO2 99%  Physical Exam  Nursing note and vitals reviewed. Constitutional: She is oriented to  person, place, and time. She appears well-developed.  HENT:  Head: Normocephalic.  Mild tenderness to the back of the head.  Eyes: Conjunctivae and EOM are normal. No scleral icterus.  Neck: Neck supple. No thyromegaly present.  Cardiovascular: Normal rate and regular rhythm.  Exam reveals no gallop and no friction rub.   No murmur heard. Pulmonary/Chest: No stridor. She has no wheezes. She has no rales. She exhibits no tenderness.  Abdominal: She exhibits no distension. There is no tenderness. There is no rebound.  Musculoskeletal: Normal range of motion. She exhibits tenderness. She exhibits no edema.  Tenderness to left, lateral, lower ribs  Lymphadenopathy:    She has no cervical adenopathy.  Neurological: She is oriented to person, place, and time. She exhibits normal muscle tone. Coordination normal.  Skin: No rash noted. No erythema.  Psychiatric: She has a normal mood and affect. Her behavior is normal.    ED Course  Procedures (including critical care time)  DIAGNOSTIC STUDIES: Oxygen Saturation is 99% on room air, normal by my interpretation.    COORDINATION OF CARE: At 5701 Discussed treatment plan with patient which includes CT scan of head and cervical spine. Patient agrees.   Labs Review Labs Reviewed - No data to display  Imaging Review Dg Ribs Unilateral W/chest Left  06/22/2014   CLINICAL DATA:  Left inferior anterior chest pain since a fall yesterday when she tripped over a water hose in the yard and landed on a Marketing executive.  EXAM: LEFT RIBS AND CHEST - 3+ VIEW  COMPARISON:  Chest x-ray dated 06/06/2014  FINDINGS: No fracture or other bone lesions are seen involving the ribs. There is no evidence of pneumothorax or pleural effusion. Both lungs are clear. Heart size and mediastinal contours are within normal limits.  IMPRESSION: Normal exam.   Electronically Signed   By: Rozetta Nunnery M.D.   On: 06/22/2014 16:13   Dg Elbow 2 Views Left  06/22/2014   CLINICAL  DATA:  Lateral elbow pain and bruising. Fall yesterday with the injury on the hitch of a trailer.  EXAM: LEFT ELBOW - 2 VIEW  COMPARISON:  08/04/2005.  FINDINGS: Minimal spurring along the radial head, olecranon, and coronoid process without visible fracture. Supinator fat pad normal. No elbow joint effusion observed. There is dorsal soft tissue swelling along the proximal metadiaphysis of the ulna.  IMPRESSION: 1. Dorsal soft tissue swelling in the proximal forearm. No underlying fracture, or elbow joint effusion identified. 2. Mild spurring in the elbow.   Electronically Signed   By: Sherryl Barters M.D.   On: 06/22/2014 16:10   Ct Head Wo Contrast  06/22/2014   CLINICAL DATA:  Fall yesterday.  Headache.  EXAM: CT HEAD WITHOUT CONTRAST  CT CERVICAL SPINE WITHOUT CONTRAST  TECHNIQUE: Multidetector CT imaging of the head and cervical spine was performed following the standard protocol without intravenous contrast. Multiplanar CT image reconstructions of the cervical spine were also generated.  COMPARISON:  08/18/2011.  FINDINGS: CT HEAD FINDINGS  There is no intra-axial or extra-axial pathologic or blood collection. No mass lesion. No hydrocephalus. Diffuse cerebral atrophy. White matter changes noted consistent chronic ischemia. Calvarium is intact. Visualized paranasal sinuses and mastoids are clear. Orbits are unremarkable.  CT CERVICAL SPINE FINDINGS  Carotid atherosclerotic vascular disease. No acute bony abnormality. Diffuse degenerative change. No evidence of fracture or dislocation .  IMPRESSION: 1. No acute intracranial abnormality. Chronic white matter ischemic change. 2. No acute cervical spine abnormality. Diffuse severe degenerative change. 3. Carotid atherosclerotic vascular disease.   Electronically Signed   By: Marcello Moores  Register   On: 06/22/2014 17:48   Ct Cervical Spine Wo Contrast  06/22/2014   CLINICAL DATA:  Fall yesterday.  Headache.  EXAM: CT HEAD WITHOUT CONTRAST  CT CERVICAL SPINE  WITHOUT CONTRAST  TECHNIQUE: Multidetector CT imaging of the head and cervical spine was performed following the standard protocol without intravenous contrast. Multiplanar CT image reconstructions of the cervical spine were also generated.  COMPARISON:  08/18/2011.  FINDINGS: CT HEAD FINDINGS  There is no intra-axial or extra-axial pathologic or blood collection. No mass lesion. No hydrocephalus. Diffuse cerebral  atrophy. White matter changes noted consistent chronic ischemia. Calvarium is intact. Visualized paranasal sinuses and mastoids are clear. Orbits are unremarkable.  CT CERVICAL SPINE FINDINGS  Carotid atherosclerotic vascular disease. No acute bony abnormality. Diffuse degenerative change. No evidence of fracture or dislocation .  IMPRESSION: 1. No acute intracranial abnormality. Chronic white matter ischemic change. 2. No acute cervical spine abnormality. Diffuse severe degenerative change. 3. Carotid atherosclerotic vascular disease.   Electronically Signed   By: Marcello Moores  Register   On: 06/22/2014 17:48     EKG Interpretation None      MDM   Final diagnoses:  Injury    The chart was scribed for me under my direct supervision.  I personally performed the history, physical, and medical decision making and all procedures in the evaluation of this patient.Mary Diego, MD 06/22/14 [phone]

## 2014-07-05 ENCOUNTER — Encounter: Payer: Self-pay | Admitting: Cardiovascular Disease

## 2014-07-05 ENCOUNTER — Ambulatory Visit (INDEPENDENT_AMBULATORY_CARE_PROVIDER_SITE_OTHER): Payer: Commercial Managed Care - HMO | Admitting: Cardiovascular Disease

## 2014-07-05 VITALS — BP 150/70 | HR 59 | Ht 65.0 in | Wt 167.7 lb

## 2014-07-05 DIAGNOSIS — I2510 Atherosclerotic heart disease of native coronary artery without angina pectoris: Secondary | ICD-10-CM

## 2014-07-05 DIAGNOSIS — K219 Gastro-esophageal reflux disease without esophagitis: Secondary | ICD-10-CM

## 2014-07-05 DIAGNOSIS — E118 Type 2 diabetes mellitus with unspecified complications: Secondary | ICD-10-CM

## 2014-07-05 DIAGNOSIS — E785 Hyperlipidemia, unspecified: Secondary | ICD-10-CM

## 2014-07-05 DIAGNOSIS — G4730 Sleep apnea, unspecified: Secondary | ICD-10-CM

## 2014-07-05 DIAGNOSIS — E782 Mixed hyperlipidemia: Secondary | ICD-10-CM

## 2014-07-05 DIAGNOSIS — I10 Essential (primary) hypertension: Secondary | ICD-10-CM

## 2014-07-05 MED ORDER — EZETIMIBE 10 MG PO TABS: 10.0000 mg | ORAL_TABLET | Freq: Every day | ORAL | Status: DC

## 2014-07-05 MED ORDER — IRBESARTAN 150 MG PO TABS: ORAL_TABLET | ORAL | Status: DC

## 2014-07-05 MED ORDER — ISOSORBIDE MONONITRATE ER 60 MG PO TB24: 60.0000 mg | ORAL_TABLET | Freq: Every day | ORAL | Status: DC

## 2014-07-05 NOTE — Patient Instructions (Signed)
Your physician has recommended you make the following change in your medication: start new prescription for zetia in addition to your atorvastatin. Increase the isosorbide from 30 mg to 60 mg daily. The prescriptions have already been sent to your pharmacy.  Your physician recommends that you return for lab work in: 4 weeks fasting.  Your physician wants you to follow-up in: 2 months.

## 2014-07-05 NOTE — Progress Notes (Signed)
Patient ID: Valeria Batman, female   DOB: 07-Aug-1926, 80 y.o.   MRN: [id number]     HPI: Shantrell Placzek is a 80 y.o. female who presents to the office today for a follow up cardiology evaluation.  Mrs. Raczkowski iron is an 80 year old female who has known coronary artery disease.  In September 2011 suffered a large out of hospital anterior wall myocardial infarction, treated by CHF and post MI pericarditis.  At that time, she had significant thrombus burden requiring thrombectomy.  She also a concomitant CAD involving her diagonal branch of her LAD as well as RCA.  She recently was readmitted to the hospital with increasing shortness of breath.  A nuclear study was performed which demonstrated inferior apical ischemia.  06/08/2014, she underwent repeat cardiac catheterization by me.  This showed moderate LV dysfunction with global hypokinesis and slightly more pronounced inferior hypocontractility.  Ejection fraction was 35%.  There was moderate CAD with 50-60% smooth eccentric stenosis in the first diagonal branch of the LAD, widely patent proximal tandem stents in the circumflex vessel with a focal 70% stenosis of a small AV groove circumflex, and 50-60% stenosis in the proximal RCA with mild mid systolic bridging in a dominant RCA system.  Increased medical therapy was recommended.  She also has a history of obstructive sleep apnea and had been using CPAP therapy.  However, recently she states she has not been consistently using CPAP.  She also has a history of hyperlipidemia for which she has been taking atorvastatin 40 mg.  There is a history of hypertension for which she has been on furosemide 40 mg in addition to her isosorbide.  She recently sustained a fall and was evaluated in the emergency room by Dr. Fuller Song.  CT of her head was negative, although she had chronic white matter ischemic change.  Her chads diffuse severe degenerative changes.  She was also noted to have carotid atherosclerotic  vascular disease.  She has noted some vague mild chest discomfort.  She denies palpitations.  She presents for evaluation.  Past Medical History  Diagnosis Date  . Hypertension   . Osteoarthritis   . Osteopenia   . Diverticulosis of colon   . Diabetes mellitus     type II  . Hyperlipidemia   . CAD (coronary artery disease)   . MI (myocardial infarction) 05/10/2010    Dr Claiborne Billings, Rx'd with CFX  DES  . OSA on CPAP   . Stroke 2002  . HYPERLIPIDEMIA 01/07/2008  . GIB (gastrointestinal bleeding) 2002    Diverticular  . Skin disorder   . Visual disorder   . Thrombus   . Sleep apnea     cpap therapy    Past Surgical History  Procedure Laterality Date  . Abdominal hysterectomy  1980    no bso  . Coronary angioplasty with stent placement  05/11/2010    stent CFX  . Cardiac catheterization  07/2011    LAD OK, D1 80%, CFX 30% w/ patent stent, RCA 50-60%, EF 40%  . Thrombectomy    . Cardiac catheterization    . Doppler echocardiography    . Myocardial perfusion study      Allergies  Allergen Reactions  . Clopidogrel Bisulfate Nausea And Vomiting    Patient is not aware of this allergy  . Lisinopril Cough    Patient is not aware of this allergy    Current Outpatient Prescriptions  Medication Sig Dispense Refill  . aspirin 81 MG EC tablet Take 81 mg  by mouth daily.      Marland Kitchen atorvastatin (LIPITOR) 40 MG tablet Take 1 tablet (40 mg total) by mouth daily at 6 PM. 30 tablet 2  . Cyanocobalamin (VITAMIN B-12) 500 MCG SUBL Place 1 tablet (500 mcg total) under the tongue 1 day or 1 dose. 100 tablet 3  . fluticasone (FLONASE) 50 MCG/ACT nasal spray Place 1 spray into both nostrils daily as needed for allergies or rhinitis.    . furosemide (LASIX) 40 MG tablet Take 1 tablet (40 mg total) by mouth daily. 30 tablet 1  . hydrOXYzine (ATARAX/VISTARIL) 25 MG tablet Take one at night to help sleep 20 tablet 0  . metFORMIN (GLUCOPHAGE) 500 MG tablet Take 1 tablet (500 mg total) by mouth 2 (two)  times daily with a meal. 180 tablet 3  . potassium chloride SA (K-DUR,KLOR-CON) 20 MEQ tablet Take 1 tablet (20 mEq total) by mouth 2 (two) times daily. 180 tablet 3  . traMADol (ULTRAM) 50 MG tablet Take 1-2 tablets (50-100 mg total) by mouth 2 (two) times daily as needed for pain. 1-2 tabs bid prn for pain 180 tablet 3  . traMADol (ULTRAM) 50 MG tablet Take 1 tablet (50 mg total) by mouth every 6 (six) hours as needed. 20 tablet 0  . cholecalciferol (VITAMIN D) 1000 UNITS tablet Take 1 tablet (1,000 Units total) by mouth daily. 100 tablet 3  . ezetimibe (ZETIA) 10 MG tablet Take 1 tablet (10 mg total) by mouth daily. 90 tablet 3  . irbesartan (AVAPRO) 150 MG tablet Take 1/2 tablet daily for 2 weeks then 1 tablet daily therafter 90 tablet 3  . isosorbide mononitrate (IMDUR) 60 MG 24 hr tablet Take 1 tablet (60 mg total) by mouth daily. 90 tablet 3   No current facility-administered medications for this visit.    History   Social History  . Marital Status: Married    Spouse Name: N/A    Number of Children: N/A  . Years of Education: N/A   Occupational History  . Retired    Social History Main Topics  . Smoking status: Never Smoker   . Smokeless tobacco: Not on file  . Alcohol Use: No  . Drug Use: No  . Sexual Activity: Not on file   Other Topics Concern  . Not on file   Social History Narrative    Family History  Problem Relation Age of Onset  . Hypertension Other     ROS General: Negative; No fevers, chills, or night sweats HEENT: Negative; No changes in vision or hearing, sinus congestion, difficulty swallowing Pulmonary: Negative; No cough, wheezing, shortness of breath, hemoptysis Cardiovascular: See HPI: No chest pain, presyncope, syncope, palpatations GI: positive for GERD; No nausea, vomiting, diarrhea, or abdominal pain GU: Negative; No dysuria, hematuria, or difficulty voiding Musculoskeletal: degenerative changes to her cervical spine.  Arthritis;    Hematologic: Negative; no easy bruising, bleeding Endocrine: positive for diabetes mellitus Neuro: Negative; no changes in balance, headaches Skin: Negative; No rashes or skin lesions Psychiatric: Negative; No behavioral problems, depression Sleep: Negative; No snoring,  daytime sleepiness, hypersomnolence, bruxism, restless legs, hypnogognic hallucinations. Other comprehensive 14 point system review is negative   Physical Exam BP 150/70 mmHg  Pulse 59  Ht 5\' 5"  (1.651 m)  Wt 167 lb 11.2 oz (76.068 kg)  BMI 27.91 kg/m2 General: Alert, oriented, no distress.  Skin: normal turgor, no rashes, warm and dry HEENT: Normocephalic, atraumatic. Pupils equal round and reactive to light; sclera anicteric; extraocular muscles  intact, No lid lag; Nose without nasal septal hypertrophy; Mouth/Parynx benign; Mallinpatti scale 3 Neck: No JVD, no carotid bruits; normal carotid upstroke Lungs: clear to ausculatation and percussion bilaterally; no wheezing or rales, normal inspiratory and expiratory effort Chest wall: without tenderness to palpitation Heart: PMI not displaced, RRR, s1 s2 normal, 1/6 systolic murmur, No diastolic murmur, no rubs, gallops, thrills, or heaves Abdomen: soft, nontender; no hepatosplenomehaly, BS+; abdominal aorta nontender and not dilated by palpation. Back: no CVA tenderness Pulses: 2+  Musculoskeletal: full range of motion, normal strength, no joint deformities Extremities: Pulses 2+, no clubbing cyanosis or edema, Homan's sign negative  Neurologic: grossly nonfocal; Cranial nerves grossly wnl Psychologic: Normal mood and affect   ECG (independently read by me): sinus bradycardia 59 bpm.  LVH with repolarization changes.  Nonspecific T changes.   LABS:  BMET    Component Value Date/Time   NA 139 06/08/2014 0400   K 4.3 06/08/2014 0400   CL 103 06/08/2014 0400   CO2 25 06/08/2014 0400   GLUCOSE 138* 06/08/2014 0400   BUN 21 06/08/2014 0400   CREATININE 1.29*  06/08/2014 0400   CALCIUM 9.3 06/08/2014 0400   GFRNONAA 36* 06/08/2014 0400   GFRAA 42* 06/08/2014 0400     Hepatic Function Panel     Component Value Date/Time   PROT 6.3 06/07/2014 0220   ALBUMIN 3.0* 06/07/2014 0220   AST 15 06/07/2014 0220   ALT 10 06/07/2014 0220   ALKPHOS 69 06/07/2014 0220   BILITOT 0.3 06/07/2014 0220   BILIDIR <0.2 06/07/2014 0220   IBILI NOT CALCULATED 06/07/2014 0220     CBC    Component Value Date/Time   WBC 5.4 06/08/2014 0400   RBC 4.03 06/08/2014 0400   HGB 12.1 06/08/2014 0400   HCT 36.2 06/08/2014 0400   PLT 185 06/08/2014 0400   MCV 89.8 06/08/2014 0400   MCH 30.0 06/08/2014 0400   MCHC 33.4 06/08/2014 0400   RDW 12.5 06/08/2014 0400   LYMPHSABS 1.7 01/25/2013 1515   MONOABS 0.8 01/25/2013 1515   EOSABS 0.3 01/25/2013 1515   BASOSABS 0.0 01/25/2013 1515     BNP    Component Value Date/Time   PROBNP 199.0* 12/09/2011 1109    Lipid Panel     Component Value Date/Time   CHOL 244* 06/06/2014 1421   TRIG 99 06/06/2014 1421   HDL 73 06/06/2014 1421   CHOLHDL 3.3 06/06/2014 1421   VLDL 20 06/06/2014 1421   LDLCALC 151* 06/06/2014 1421   LDLDIRECT 180.7 12/09/2011 1109     RADIOLOGY: Dg Ribs Unilateral W/chest Left  06/22/2014   CLINICAL DATA:  Left inferior anterior chest pain since a fall yesterday when she tripped over a water hose in the yard and landed on a Marketing executive.  EXAM: LEFT RIBS AND CHEST - 3+ VIEW  COMPARISON:  Chest x-ray dated 06/06/2014  FINDINGS: No fracture or other bone lesions are seen involving the ribs. There is no evidence of pneumothorax or pleural effusion. Both lungs are clear. Heart size and mediastinal contours are within normal limits.  IMPRESSION: Normal exam.   Electronically Signed   By: Rozetta Nunnery M.D.   On: 06/22/2014 16:13   Dg Elbow 2 Views Left  06/22/2014   CLINICAL DATA:  Lateral elbow pain and bruising. Fall yesterday with the injury on the hitch of a trailer.  EXAM: LEFT ELBOW -  2 VIEW  COMPARISON:  08/04/2005.  FINDINGS: Minimal spurring along the radial head, olecranon, and  coronoid process without visible fracture. Supinator fat pad normal. No elbow joint effusion observed. There is dorsal soft tissue swelling along the proximal metadiaphysis of the ulna.  IMPRESSION: 1. Dorsal soft tissue swelling in the proximal forearm. No underlying fracture, or elbow joint effusion identified. 2. Mild spurring in the elbow.   Electronically Signed   By: Sherryl Barters M.D.   On: 06/22/2014 16:10   Ct Head Wo Contrast  06/22/2014   CLINICAL DATA:  Fall yesterday.  Headache.  EXAM: CT HEAD WITHOUT CONTRAST  CT CERVICAL SPINE WITHOUT CONTRAST  TECHNIQUE: Multidetector CT imaging of the head and cervical spine was performed following the standard protocol without intravenous contrast. Multiplanar CT image reconstructions of the cervical spine were also generated.  COMPARISON:  08/18/2011.  FINDINGS: CT HEAD FINDINGS  There is no intra-axial or extra-axial pathologic or blood collection. No mass lesion. No hydrocephalus. Diffuse cerebral atrophy. White matter changes noted consistent chronic ischemia. Calvarium is intact. Visualized paranasal sinuses and mastoids are clear. Orbits are unremarkable.  CT CERVICAL SPINE FINDINGS  Carotid atherosclerotic vascular disease. No acute bony abnormality. Diffuse degenerative change. No evidence of fracture or dislocation .  IMPRESSION: 1. No acute intracranial abnormality. Chronic white matter ischemic change. 2. No acute cervical spine abnormality. Diffuse severe degenerative change. 3. Carotid atherosclerotic vascular disease.   Electronically Signed   By: Marcello Moores  Register   On: 06/22/2014 17:48   Ct Cervical Spine Wo Contrast  06/22/2014   CLINICAL DATA:  Fall yesterday.  Headache.  EXAM: CT HEAD WITHOUT CONTRAST  CT CERVICAL SPINE WITHOUT CONTRAST  TECHNIQUE: Multidetector CT imaging of the head and cervical spine was performed following the standard  protocol without intravenous contrast. Multiplanar CT image reconstructions of the cervical spine were also generated.  COMPARISON:  08/18/2011.  FINDINGS: CT HEAD FINDINGS  There is no intra-axial or extra-axial pathologic or blood collection. No mass lesion. No hydrocephalus. Diffuse cerebral atrophy. White matter changes noted consistent chronic ischemia. Calvarium is intact. Visualized paranasal sinuses and mastoids are clear. Orbits are unremarkable.  CT CERVICAL SPINE FINDINGS  Carotid atherosclerotic vascular disease. No acute bony abnormality. Diffuse degenerative change. No evidence of fracture or dislocation .  IMPRESSION: 1. No acute intracranial abnormality. Chronic white matter ischemic change. 2. No acute cervical spine abnormality. Diffuse severe degenerative change. 3. Carotid atherosclerotic vascular disease.   Electronically Signed   By: Marcello Moores  Register   On: 06/22/2014 17:48      ASSESSMENT AND PLAN: Mrs. Whipple  is an 80 year old female who suffered a left circumflex MI in 2011 and underwent tandem stenting of her proximal circumflex vessel.  I again reviewed with her her most recent cardiac catheterization from 06/08/2014.  Her blood pressure today is elevated at 150/70.  I am adding ARB therapy with irbesartan and she will start that at 75 mg daily for 2 weeks and then increase this to 150 mg daily.  Laboratory from her recent hospitalization had shown an elevated LDL cholesterol and I am adding 70 at 10 mg to her current dose of atorvastatin 40 mg. I reviewed her recent CT findings as noted above. I discussed the importance of resumption of CPAP therapy for her obstructive sleep apnea. She will have follow-up blood work with CMP, and lipid panel.I will see her in 2 months for follow-up cardiology evaluation.     Troy Sine, MD, [facility]  07/07/2014 2:13 PM

## 2014-07-11 ENCOUNTER — Other Ambulatory Visit: Payer: Self-pay | Admitting: Cardiovascular Disease

## 2014-07-11 NOTE — Telephone Encounter (Signed)
Dr.Kelly prescribe a new medication for her and when she went to pick it , the pharmacy states that he has not called it in as of yet. Please call   Thanks

## 2014-07-11 NOTE — Telephone Encounter (Signed)
Spoke with pt, aware medication was sent to [facility] order pharm.

## 2014-07-26 ENCOUNTER — Telehealth: Payer: Self-pay | Admitting: Cardiovascular Disease

## 2014-07-26 NOTE — Telephone Encounter (Signed)
Please call,pt says she need her medicine,but does not know the name of it.She said Dr Claiborne Billings wrote her prescriptions for it.She think one might been tramadol.

## 2014-07-26 NOTE — Telephone Encounter (Signed)
Spoke with patient - she states she never received the medications that Dr. Claiborne Billings Rx'ed on 11/10 @ her OV. (zetia, irbesartan, isosorbide mononitrate).   Called [employer] - patient had called in on 11/18 informing them she did not want zetia sent to her r/t cost. They informed RN that isosorbide and irbesartan were delivered on 11/20 (per [employer] tracking). They will prepare zetia to be shipped.   Spoke with patient and informed her of this information. She states if Dr. Claiborne Billings wants her on zetia, she will take it, and she was offered samples as she needs them (advised to contact office). Patient states she got some boxes in the mail with medication, so she reckons that must be the other two. She was advised to seek assistance with preparing her medications for the day/week and said she can go to her pharmacy for this.

## 2014-08-03 ENCOUNTER — Encounter (HOSPITAL_COMMUNITY): Payer: Self-pay | Admitting: Cardiovascular Disease

## 2014-08-04 ENCOUNTER — Encounter (HOSPITAL_COMMUNITY): Payer: Self-pay | Admitting: Cardiovascular Disease

## 2014-08-05 ENCOUNTER — Ambulatory Visit (HOSPITAL_COMMUNITY): Payer: Medicare HMO | Attending: Internal Medicine | Admitting: Physical Therapy

## 2014-08-16 ENCOUNTER — Ambulatory Visit (HOSPITAL_COMMUNITY): Payer: Medicare HMO | Admitting: Physical Therapy

## 2014-08-22 ENCOUNTER — Telehealth: Payer: Self-pay | Admitting: Internal Medicine

## 2014-08-25 ENCOUNTER — Ambulatory Visit: Payer: Commercial Managed Care - HMO | Admitting: Internal Medicine

## 2014-08-30 ENCOUNTER — Encounter: Payer: Self-pay | Admitting: Internal Medicine

## 2014-09-05 ENCOUNTER — Ambulatory Visit (INDEPENDENT_AMBULATORY_CARE_PROVIDER_SITE_OTHER): Payer: Commercial Managed Care - HMO | Admitting: Cardiovascular Disease

## 2014-09-05 ENCOUNTER — Encounter: Payer: Self-pay | Admitting: Cardiovascular Disease

## 2014-09-05 VITALS — BP 152/70 | HR 68 | Ht 65.0 in | Wt 176.4 lb

## 2014-09-05 DIAGNOSIS — G4733 Obstructive sleep apnea (adult) (pediatric): Secondary | ICD-10-CM | POA: Diagnosis not present

## 2014-09-05 DIAGNOSIS — E785 Hyperlipidemia, unspecified: Secondary | ICD-10-CM | POA: Diagnosis not present

## 2014-09-05 DIAGNOSIS — Z79899 Other long term (current) drug therapy: Secondary | ICD-10-CM | POA: Diagnosis not present

## 2014-09-05 DIAGNOSIS — E118 Type 2 diabetes mellitus with unspecified complications: Secondary | ICD-10-CM

## 2014-09-05 DIAGNOSIS — I10 Essential (primary) hypertension: Secondary | ICD-10-CM

## 2014-09-05 DIAGNOSIS — E119 Type 2 diabetes mellitus without complications: Secondary | ICD-10-CM | POA: Diagnosis not present

## 2014-09-05 DIAGNOSIS — I2510 Atherosclerotic heart disease of native coronary artery without angina pectoris: Secondary | ICD-10-CM | POA: Diagnosis not present

## 2014-09-05 DIAGNOSIS — Z9989 Dependence on other enabling machines and devices: Secondary | ICD-10-CM

## 2014-09-05 MED ORDER — CARVEDILOL 3.125 MG PO TABS: 3.1250 mg | ORAL_TABLET | Freq: Two times a day (BID) | ORAL | Status: DC

## 2014-09-05 NOTE — Progress Notes (Signed)
Patient ID: Mary Cook, female   DOB: 02-Sep-1925, 81 y.o.   MRN: [id number]     HPI: Wilmer Berryhill is a 81 y.o. female who presents to the office today for a follow up cardiology evaluation.  Mrs. Tozzi has known coronary artery disease.  In September 2011 suffered a large out of hospital anterior wall myocardial infarction, treated by CHF and post MI pericarditis.  At that time, she had significant thrombus burden requiring thrombectomy.  She  and underwent stenting of her left circumflex coronary artery with tandem 3.015 mm Promus stents postdilated 3.5 mm meters.  She had concomitant CAD involving her diagonal branch of her LAD as well as RCA.  Last year she was readmitted to the hospital with increasing shortness of breath.  A nuclear study was performed which demonstrated inferior apical ischemia. On  06/08/2014,  repeat cardiac catheterization by me showed moderate LV dysfunction with global hypokinesis and slightly more pronounced inferior hypocontractility.  Ejection fraction was 35%.  There was moderate CAD with 50-60% smooth eccentric stenosis in the first diagonal branch of the LAD, widely patent proximal tandem stents in the circumflex vessel with a focal 70% stenosis of a small AV groove circumflex, and 50-60% stenosis in the proximal RCA with mild mid systolic bridging in a dominant RCA system.  Increased medical therapy was recommended.  She also has a history of obstructive sleep apnea and had been using CPAP therapy.  Presently, she admits to 100% compliance.  She is unaware of breakthrough snoring.  She is diabetic on metformin 500 mg twice a day. She also has a history of hyperlipidemia for which she has been taking atorvastatin 40 mg.  There is a history of hypertension for which she has been on furosemide 40 mg in addition to her isosorbide.  She  sustained a fall and was evaluated in the emergency room by Dr. Fuller Song in October 2015.  CT of her head was negative, although  she had chronic white matter ischemic change and diffuse severe degenerative changes.  She was also noted to have carotid atherosclerotic vascular disease.  Since I last saw her, she denies significant episodes of chest pain and has  noticed a rare mild recurrence.  She is unaware of palpitations.  At times she notes some mild dizziness.  She denies palpitations.    Past Medical History  Diagnosis Date  . Hypertension   . Osteoarthritis   . Osteopenia   . Diverticulosis of colon   . Diabetes mellitus     type II  . Hyperlipidemia   . CAD (coronary artery disease)   . MI (myocardial infarction) 05/10/2010    Dr Claiborne Billings, Rx'd with CFX  DES  . OSA on CPAP   . Stroke 2002  . HYPERLIPIDEMIA 01/07/2008  . GIB (gastrointestinal bleeding) 2002    Diverticular  . Skin disorder   . Visual disorder   . Thrombus   . Sleep apnea     cpap therapy    Past Surgical History  Procedure Laterality Date  . Abdominal hysterectomy  1980    no bso  . Coronary angioplasty with stent placement  05/11/2010    stent CFX  . Cardiac catheterization  07/2011    LAD OK, D1 80%, CFX 30% w/ patent stent, RCA 50-60%, EF 40%  . Thrombectomy    . Cardiac catheterization    . Doppler echocardiography    . Myocardial perfusion study    . Left heart catheterization with coronary angiogram N/A  08/18/2011    Procedure: LEFT HEART CATHETERIZATION WITH CORONARY ANGIOGRAM;  Surgeon: Wellington Hampshire, MD;  Location: [facility] CATH [facility];  Service: Cardiovascular;  Laterality: N/A;  . Left heart catheterization with coronary angiogram N/A 06/08/2014    Procedure: LEFT HEART CATHETERIZATION WITH CORONARY ANGIOGRAM;  Surgeon: Troy Sine, MD;  Location: [facility] CATH LAB;  Service: Cardiovascular;  Laterality: N/A;    Allergies  Allergen Reactions  . Clopidogrel Bisulfate Nausea And Vomiting    Patient is not aware of this allergy  . Lisinopril Cough    Patient is not aware of this allergy    Current Outpatient Prescriptions    Medication Sig Dispense Refill  . aspirin 81 MG EC tablet Take 81 mg by mouth daily.      Marland Kitchen atorvastatin (LIPITOR) 40 MG tablet Take 1 tablet (40 mg total) by mouth daily at 6 PM. 30 tablet 2  . Cyanocobalamin (VITAMIN B-12) 500 MCG SUBL Place 1 tablet (500 mcg total) under the tongue 1 day or 1 dose. 100 tablet 3  . fluticasone (FLONASE) 50 MCG/ACT nasal spray Place 1 spray into both nostrils daily as needed for allergies or rhinitis.    . furosemide (LASIX) 40 MG tablet Take 1 tablet (40 mg total) by mouth daily. 30 tablet 1  . hydrOXYzine (ATARAX/VISTARIL) 25 MG tablet Take one at night to help sleep 20 tablet 0  . irbesartan (AVAPRO) 150 MG tablet Take 1/2 tablet daily for 2 weeks then 1 tablet daily therafter 90 tablet 3  . isosorbide mononitrate (IMDUR) 60 MG 24 hr tablet Take 1 tablet (60 mg total) by mouth daily. 90 tablet 3  . metFORMIN (GLUCOPHAGE) 500 MG tablet Take 1 tablet (500 mg total) by mouth 2 (two) times daily with a meal. 180 tablet 3  . potassium chloride SA (K-DUR,KLOR-CON) 20 MEQ tablet Take 1 tablet (20 mEq total) by mouth 2 (two) times daily. 180 tablet 3  . traMADol (ULTRAM) 50 MG tablet Take 1-2 tablets (50-100 mg total) by mouth 2 (two) times daily as needed for pain. 1-2 tabs bid prn for pain 180 tablet 3  . cholecalciferol (VITAMIN D) 1000 UNITS tablet Take 1 tablet (1,000 Units total) by mouth daily. 100 tablet 3   No current facility-administered medications for this visit.    History   Social History  . Marital Status: Married    Spouse Name: N/A    Number of Children: N/A  . Years of Education: N/A   Occupational History  . Retired    Social History Main Topics  . Smoking status: Never Smoker   . Smokeless tobacco: Not on file  . Alcohol Use: No  . Drug Use: No  . Sexual Activity: Not on file   Other Topics Concern  . Not on file   Social History Narrative    Family History  Problem Relation Age of Onset  . Hypertension Other      ROS General: Negative; No fevers, chills, or night sweats HEENT: Negative; No changes in vision or hearing, sinus congestion, difficulty swallowing Pulmonary: Negative; No cough, wheezing, shortness of breath, hemoptysis Cardiovascular: See HPI:  GI: positive for GERD; No nausea, vomiting, diarrhea, or abdominal pain GU: Negative; No dysuria, hematuria, or difficulty voiding Musculoskeletal: degenerative changes to her cervical spine.  Arthritis;  Hematologic: Negative; no easy bruising, bleeding Endocrine: positive for diabetes mellitus Neuro: Negative; no changes in balance, headaches Skin: Negative; No rashes or skin lesions Psychiatric: Negative; No behavioral problems, depression Sleep:  Negative; No snoring,  daytime sleepiness, hypersomnolence, bruxism, restless legs, hypnogognic hallucinations. Other comprehensive 14 point system review is negative   Physical Exam BP 152/70 mmHg  Pulse 68  Ht 5\' 5"  (1.651 m)  Wt 176 lb 6.4 oz (80.015 kg)  BMI 29.35 kg/m2 General: Alert, oriented, no distress.  Skin: normal turgor, no rashes, warm and dry HEENT: Normocephalic, atraumatic. Pupils equal round and reactive to light; sclera anicteric; extraocular muscles intact, No lid lag; Nose without nasal septal hypertrophy; Mouth/Parynx benign; Mallinpatti scale 3 Neck: No JVD, no carotid bruits; normal carotid upstroke Lungs: clear to ausculatation and percussion bilaterally; no wheezing or rales, normal inspiratory and expiratory effort Chest wall: without tenderness to palpitation Heart: PMI not displaced, RRR, s1 s2 normal, 1/6 systolic murmur, No diastolic murmur, no rubs, gallops, thrills, or heaves Abdomen: soft, nontender; no hepatosplenomehaly, BS+; abdominal aorta nontender and not dilated by palpation. Back: no CVA tenderness Pulses: 2+  Musculoskeletal: full range of motion, normal strength, no joint deformities Extremities: Pulses 2+, no clubbing cyanosis or edema, Homan's  sign negative  Neurologic: grossly nonfocal; Cranial nerves grossly wnl Psychologic: Normal mood and affect  ECG (independently read by me): Normal sinus rhythm at 68 bpm.  LVH by voltage in aVL.  Nonspecific T changes.  November 2015 ECG (independently read by me): sinus bradycardia 59 bpm.  LVH with repolarization changes.  Nonspecific T changes.   LABS:  BMET    Component Value Date/Time   NA 139 06/08/2014 0400   K 4.3 06/08/2014 0400   CL 103 06/08/2014 0400   CO2 25 06/08/2014 0400   GLUCOSE 138* 06/08/2014 0400   BUN 21 06/08/2014 0400   CREATININE 1.29* 06/08/2014 0400   CALCIUM 9.3 06/08/2014 0400   GFRNONAA 36* 06/08/2014 0400   GFRAA 42* 06/08/2014 0400     Hepatic Function Panel     Component Value Date/Time   PROT 6.3 06/07/2014 0220   ALBUMIN 3.0* 06/07/2014 0220   AST 15 06/07/2014 0220   ALT 10 06/07/2014 0220   ALKPHOS 69 06/07/2014 0220   BILITOT 0.3 06/07/2014 0220   BILIDIR <0.2 06/07/2014 0220   IBILI NOT CALCULATED 06/07/2014 0220     CBC    Component Value Date/Time   WBC 5.4 06/08/2014 0400   RBC 4.03 06/08/2014 0400   HGB 12.1 06/08/2014 0400   HCT 36.2 06/08/2014 0400   PLT 185 06/08/2014 0400   MCV 89.8 06/08/2014 0400   MCH 30.0 06/08/2014 0400   MCHC 33.4 06/08/2014 0400   RDW 12.5 06/08/2014 0400   LYMPHSABS 1.7 01/25/2013 1515   MONOABS 0.8 01/25/2013 1515   EOSABS 0.3 01/25/2013 1515   BASOSABS 0.0 01/25/2013 1515     BNP    Component Value Date/Time   PROBNP 199.0* 12/09/2011 1109    Lipid Panel     Component Value Date/Time   CHOL 244* 06/06/2014 1421   TRIG 99 06/06/2014 1421   HDL 73 06/06/2014 1421   CHOLHDL 3.3 06/06/2014 1421   VLDL 20 06/06/2014 1421   LDLCALC 151* 06/06/2014 1421   LDLDIRECT 180.7 12/09/2011 1109     RADIOLOGY: No results found.    ASSESSMENT AND PLAN: Mrs. Pha  is an 81 year old female who suffered a left circumflex MI in 2011 and underwent tandem stenting of her  proximal circumflex vessel.  Her last cardiac catheterization from 06/08/2014 showed moderate LV dysfunction with moderate multivessel CAD but widely patent tandem stents in the circumflex vessel.  She has  been on medical therapy.  Her blood pressure today is elevated at 152/70.  I last saw her, I added irbesartan to her medical regimen and she now is been taking 150 mg daily in addition to furosemide 40 mg as well as her isosorbide mononitrate 60 mg.  Her resting pulse is in the 60s.  She does not have any heart block.  Remotely, she had been on beta blocker therapy several years ago.  I'm electing to resume very low-dose carvedilol at 3.125 mg twice a day.  She has been on an increased dose of atorvastatin at 40 mg.  Her LDL in October was elevated at 151.  A complete set of blood work will be obtained consisting of a CBC, comprehensive metabolic panel, hemoglobin A1c, TSH as well as lipid studies.  Target LDL is less than 70.  This patient with established CAD.  Adjustments will be made for medical regimen if necessary depending upon the laboratory results.  As long as she remains stable, I will see her in 6 months for reevaluation  Time spent: 25 minutes  Troy Sine, MD, [facility]  09/05/2014 10:00 AM

## 2014-09-05 NOTE — Patient Instructions (Signed)
Your physician has recommended you make the following change in your medication: start new prescription for carvedilol 3.125 mg. This has already been sent to your pharmacy.  Your physician recommends that you return for lab work in: fasting.   Your physician wants you to follow-up in: 6 months or sooner if needed with Dr. Claiborne Billings. You will receive a reminder letter in the mail two months in advance. If you don't receive a letter, please call our office to schedule the follow-up appointment.

## 2014-09-06 DIAGNOSIS — M4806 Spinal stenosis, lumbar region: Secondary | ICD-10-CM | POA: Diagnosis not present

## 2014-09-06 LAB — LIPID PANEL
CHOLESTEROL: 165 mg/dL (ref 0–200)
HDL: 70 mg/dL (ref >39)
LDL Cholesterol: 76 mg/dL (ref 0–99)
TRIGLYCERIDES: 93 mg/dL (ref <150)
Total CHOL/HDL Ratio: 2.4 Ratio
VLDL: 19 mg/dL (ref 0–40)

## 2014-09-06 LAB — CBC
HCT: 34.2 % — ABNORMAL LOW (ref 36.0–46.0)
HEMOGLOBIN: 11.5 g/dL — AB (ref 12.0–15.0)
MCH: 30.4 pg (ref 26.0–34.0)
MCHC: 33.6 g/dL (ref 30.0–36.0)
MCV: 90.5 fL (ref 78.0–100.0)
MPV: 10.1 fL (ref 8.6–12.4)
Platelets: 184 10*3/uL (ref 150–400)
RBC: 3.78 MIL/uL — AB (ref 3.87–5.11)
RDW: 13.9 % (ref 11.5–15.5)
WBC: 7.6 10*3/uL (ref 4.0–10.5)

## 2014-09-06 LAB — COMPREHENSIVE METABOLIC PANEL
ALBUMIN: 3.5 g/dL (ref 3.5–5.2)
ALT: 12 U/L (ref 0–35)
AST: 15 U/L (ref 0–37)
Alkaline Phosphatase: 95 U/L (ref 39–117)
BUN: 17 mg/dL (ref 6–23)
CALCIUM: 9.3 mg/dL (ref 8.4–10.5)
CHLORIDE: 102 meq/L (ref 96–112)
CO2: 27 meq/L (ref 19–32)
Creat: 1.09 mg/dL (ref 0.50–1.10)
GLUCOSE: 138 mg/dL — AB (ref 70–99)
POTASSIUM: 3.9 meq/L (ref 3.5–5.3)
Sodium: 140 mEq/L (ref 135–145)
TOTAL PROTEIN: 6.5 g/dL (ref 6.0–8.3)
Total Bilirubin: 0.7 mg/dL (ref 0.2–1.2)

## 2014-09-06 LAB — TSH: TSH: 1.195 u[IU]/mL (ref 0.350–4.500)

## 2014-09-07 LAB — HEMOGLOBIN A1C
Hgb A1c MFr Bld: 6.7 % — ABNORMAL HIGH (ref <5.7)
MEAN PLASMA GLUCOSE: 146 mg/dL — AB (ref <117)

## 2014-09-20 DIAGNOSIS — M1612 Unilateral primary osteoarthritis, left hip: Secondary | ICD-10-CM | POA: Diagnosis not present

## 2014-09-30 ENCOUNTER — Telehealth: Payer: Self-pay | Admitting: *Deleted

## 2014-09-30 NOTE — Telephone Encounter (Signed)
Left mess for patient to call back.  

## 2014-09-30 NOTE — Telephone Encounter (Signed)
-----   Message from Cassandria Anger, MD sent at 09/29/2014  9:04 PM EST ----- Erline Levine, please sch OV for this pt Thx  ----- Message -----    From: Tressa Busman, CMA    Sent: 09/20/2014   4:07 PM      To: Cassandria Anger, MD  LM with lab results and informed results sent to PCP to address anemia and DM.

## 2014-10-04 NOTE — Telephone Encounter (Signed)
Pt is scheduled 10/12/14 with PCP.

## 2014-10-12 ENCOUNTER — Other Ambulatory Visit: Payer: Self-pay | Admitting: Internal Medicine

## 2014-10-12 ENCOUNTER — Encounter: Payer: Self-pay | Admitting: Internal Medicine

## 2014-10-12 ENCOUNTER — Ambulatory Visit (INDEPENDENT_AMBULATORY_CARE_PROVIDER_SITE_OTHER): Payer: Commercial Managed Care - HMO | Admitting: Internal Medicine

## 2014-10-12 ENCOUNTER — Other Ambulatory Visit (INDEPENDENT_AMBULATORY_CARE_PROVIDER_SITE_OTHER): Payer: Commercial Managed Care - HMO

## 2014-10-12 VITALS — BP 150/100 | HR 94 | Temp 97.9°F | Wt 178.0 lb

## 2014-10-12 DIAGNOSIS — E538 Deficiency of other specified B group vitamins: Secondary | ICD-10-CM

## 2014-10-12 DIAGNOSIS — N189 Chronic kidney disease, unspecified: Secondary | ICD-10-CM | POA: Diagnosis not present

## 2014-10-12 DIAGNOSIS — M899 Disorder of bone, unspecified: Secondary | ICD-10-CM

## 2014-10-12 DIAGNOSIS — M949 Disorder of cartilage, unspecified: Secondary | ICD-10-CM

## 2014-10-12 DIAGNOSIS — R29898 Other symptoms and signs involving the musculoskeletal system: Secondary | ICD-10-CM

## 2014-10-12 DIAGNOSIS — E1022 Type 1 diabetes mellitus with diabetic chronic kidney disease: Secondary | ICD-10-CM

## 2014-10-12 DIAGNOSIS — I2510 Atherosclerotic heart disease of native coronary artery without angina pectoris: Secondary | ICD-10-CM

## 2014-10-12 DIAGNOSIS — Z23 Encounter for immunization: Secondary | ICD-10-CM | POA: Diagnosis not present

## 2014-10-12 DIAGNOSIS — R296 Repeated falls: Secondary | ICD-10-CM

## 2014-10-12 DIAGNOSIS — I2583 Coronary atherosclerosis due to lipid rich plaque: Secondary | ICD-10-CM

## 2014-10-12 LAB — CBC WITH DIFFERENTIAL/PLATELET
BASOS PCT: 0.2 % (ref 0.0–3.0)
Basophils Absolute: 0 10*3/uL (ref 0.0–0.1)
EOS PCT: 2.2 % (ref 0.0–5.0)
Eosinophils Absolute: 0.1 10*3/uL (ref 0.0–0.7)
HCT: 37.2 % (ref 36.0–46.0)
Hemoglobin: 12.7 g/dL (ref 12.0–15.0)
LYMPHS PCT: 26.2 % (ref 12.0–46.0)
Lymphs Abs: 1.7 10*3/uL (ref 0.7–4.0)
MCHC: 34.1 g/dL (ref 30.0–36.0)
MCV: 90.1 fl (ref 78.0–100.0)
Monocytes Absolute: 0.5 10*3/uL (ref 0.1–1.0)
Monocytes Relative: 7.4 % (ref 3.0–12.0)
NEUTROS ABS: 4.1 10*3/uL (ref 1.4–7.7)
Neutrophils Relative %: 64 % (ref 43.0–77.0)
Platelets: 199 10*3/uL (ref 150.0–400.0)
RBC: 4.13 Mil/uL (ref 3.87–5.11)
RDW: 13.3 % (ref 11.5–15.5)
WBC: 6.3 10*3/uL (ref 4.0–10.5)

## 2014-10-12 LAB — HEPATIC FUNCTION PANEL
ALT: 10 U/L (ref 0–35)
AST: 13 U/L (ref 0–37)
Albumin: 3.7 g/dL (ref 3.5–5.2)
Alkaline Phosphatase: 81 U/L (ref 39–117)
BILIRUBIN DIRECT: 0.1 mg/dL (ref 0.0–0.3)
BILIRUBIN TOTAL: 0.6 mg/dL (ref 0.2–1.2)
Total Protein: 7 g/dL (ref 6.0–8.3)

## 2014-10-12 LAB — SEDIMENTATION RATE: SED RATE: 43 mm/h — AB (ref 0–22)

## 2014-10-12 LAB — URINALYSIS, ROUTINE W REFLEX MICROSCOPIC
BILIRUBIN URINE: NEGATIVE
Hgb urine dipstick: NEGATIVE
Ketones, ur: NEGATIVE
Nitrite: NEGATIVE
SPECIFIC GRAVITY, URINE: 1.015 (ref 1.000–1.030)
Total Protein, Urine: NEGATIVE
UROBILINOGEN UA: 0.2 (ref 0.0–1.0)
Urine Glucose: NEGATIVE
pH: 7.5 (ref 5.0–8.0)

## 2014-10-12 LAB — BASIC METABOLIC PANEL
BUN: 19 mg/dL (ref 6–23)
CHLORIDE: 105 meq/L (ref 96–112)
CO2: 31 meq/L (ref 19–32)
Calcium: 9.7 mg/dL (ref 8.4–10.5)
Creatinine, Ser: 1.13 mg/dL (ref 0.40–1.20)
GFR: 48.47 mL/min — AB (ref >60.00)
GLUCOSE: 141 mg/dL — AB (ref 70–99)
POTASSIUM: 4.4 meq/L (ref 3.5–5.1)
Sodium: 140 mEq/L (ref 135–145)

## 2014-10-12 LAB — TSH: TSH: 1.56 u[IU]/mL (ref 0.35–4.50)

## 2014-10-12 LAB — GLUCOSE, POCT (MANUAL RESULT ENTRY): POC Glucose: 145 mg/dl — AB (ref 70–99)

## 2014-10-12 LAB — VITAMIN D 25 HYDROXY (VIT D DEFICIENCY, FRACTURES): VITD: 40.73 ng/mL (ref 30.00–100.00)

## 2014-10-12 LAB — CK: Total CK: 72 U/L (ref 7–177)

## 2014-10-12 LAB — VITAMIN B12: VITAMIN B 12: 180 pg/mL — AB (ref 211–911)

## 2014-10-12 LAB — MAGNESIUM: Magnesium: 2.2 mg/dL (ref 1.5–2.5)

## 2014-10-12 LAB — HEMOGLOBIN A1C: Hgb A1c MFr Bld: 7.1 % — ABNORMAL HIGH (ref 4.6–6.5)

## 2014-10-12 MED ORDER — FUROSEMIDE 40 MG PO TABS: 40.0000 mg | ORAL_TABLET | Freq: Every day | ORAL | Status: DC

## 2014-10-12 MED ORDER — OMEPRAZOLE 20 MG PO CPDR: 20.0000 mg | DELAYED_RELEASE_CAPSULE | Freq: Every day | ORAL | Status: DC

## 2014-10-12 MED ORDER — VITAMIN B-12 500 MCG SL SUBL: 2.0000 | SUBLINGUAL_TABLET | SUBLINGUAL | Status: DC

## 2014-10-12 NOTE — Assessment & Plan Note (Signed)
Weak legs Labs D/c Lipitor

## 2014-10-12 NOTE — Assessment & Plan Note (Signed)
Vit D 

## 2014-10-12 NOTE — Assessment & Plan Note (Signed)
Risks associated with treatment noncompliance were discussed. Compliance was encouraged. Re-start Rx Labs

## 2014-10-12 NOTE — Assessment & Plan Note (Signed)
Continue with current prescription therapy as reflected on the Med list.  

## 2014-10-12 NOTE — Patient Instructions (Signed)
Stop Lipitor 

## 2014-10-12 NOTE — Progress Notes (Signed)
Subjective:    HPI   C/o falls x2; went to [facility] ER 1-2 mo ago C/o dizziness, memory loss - she had a head CT 1-2 mo ago. C/o GERD  She ran out of her DM and HTN meds  The patient presents for a follow-up of  chronic hypertension, chronic dyslipidemia, type 2 diabetes, CAD, CHF, depression   F/u vit def - not sure if she is taking them right    Wt Readings from Last 3 Encounters:  10/12/14 178 lb (80.74 kg)  09/05/14 176 lb 6.4 oz (80.015 kg)  07/05/14 167 lb 11.2 oz (76.068 kg)   BP Readings from Last 3 Encounters:  10/12/14 150/100  09/05/14 152/70  07/05/14 150/70     BP 150/100 mmHg  Pulse 94  Temp(Src) 97.9 F (36.6 C) (Oral)  Wt 178 lb (80.74 kg)  SpO2 99%     Review of Systems  Constitutional: Positive for fatigue. Negative for fever, chills, activity change, appetite change and unexpected weight change.  HENT: Negative for congestion, mouth sores and sinus pressure.   Eyes: Negative for visual disturbance.  Respiratory: Positive for shortness of breath (mild). Negative for cough and chest tightness.   Cardiovascular: Negative for palpitations and leg swelling.  Gastrointestinal: Negative for nausea, vomiting and abdominal pain.  Genitourinary: Negative for frequency, difficulty urinating and vaginal pain.  Musculoskeletal: Negative for myalgias, back pain and gait problem.  Skin: Negative for pallor and rash.  Neurological: Negative for dizziness, tremors, weakness, numbness and headaches.  Psychiatric/Behavioral: Negative for confusion and sleep disturbance. The patient is not hyperactive.        Objective:   Physical Exam  Constitutional: She appears well-developed. No distress.  NAD Obese  HENT:  Head: Normocephalic.  Right Ear: External ear normal.  Left Ear: External ear normal.  Nose: Nose normal.  Mouth/Throat: Oropharynx is clear and moist.  Eyes: Conjunctivae are normal. Pupils are equal, round, and reactive to light. Right  eye exhibits no discharge. Left eye exhibits no discharge.  Neck: Normal range of motion. Neck supple. No JVD present. No tracheal deviation present. No thyromegaly present.  Cardiovascular: Normal rate, regular rhythm and normal heart sounds.   Pulmonary/Chest: No stridor. No respiratory distress. She has no wheezes.  Abdominal: Soft. Bowel sounds are normal. She exhibits no distension and no mass. There is no tenderness. There is no rebound and no guarding.  Musculoskeletal: She exhibits no edema or tenderness.  Lymphadenopathy:    She has no cervical adenopathy.  Neurological: She displays normal reflexes. No cranial nerve deficit. She exhibits normal muscle tone. Coordination normal.  Skin: No rash noted. No erythema.  Psychiatric: She has a normal mood and affect. Her behavior is normal. Judgment and thought content normal.  Weak LE muscles Grip (5-) B  Lab Results  Component Value Date   WBC 7.6 09/05/2014   HGB 11.5* 09/05/2014   HCT 34.2* 09/05/2014   PLT 184 09/05/2014   GLUCOSE 138* 09/05/2014   CHOL 165 09/05/2014   TRIG 93 09/05/2014   HDL 70 09/05/2014   LDLDIRECT 180.7 12/09/2011   LDLCALC 76 09/05/2014   ALT 12 09/05/2014   AST 15 09/05/2014   NA 140 09/05/2014   K 3.9 09/05/2014   CL 102 09/05/2014   CREATININE 1.09 09/05/2014   BUN 17 09/05/2014   CO2 27 09/05/2014   TSH 1.195 09/05/2014   INR 0.97 06/08/2014   HGBA1C 6.7* 09/05/2014    A complex case  Assessment & Plan:

## 2014-10-12 NOTE — Assessment & Plan Note (Signed)
Labs Continue with current prescription therapy as reflected on the Med list. CBG

## 2014-10-12 NOTE — Progress Notes (Signed)
Pre visit review using our clinic review tool, if applicable. No additional management support is needed unless otherwise documented below in the visit note. 

## 2014-10-12 NOTE — Assessment & Plan Note (Signed)
Labs D/c Lipitor

## 2014-10-14 ENCOUNTER — Telehealth: Payer: Self-pay | Admitting: Internal Medicine

## 2014-10-14 NOTE — Telephone Encounter (Signed)
Pt called in wanted to know which med that she was suppose to discontinue. She could not remember.

## 2014-10-17 NOTE — Telephone Encounter (Signed)
Spoke to patient advised to stop lipitor

## 2014-10-19 DIAGNOSIS — M4807 Spinal stenosis, lumbosacral region: Secondary | ICD-10-CM | POA: Diagnosis not present

## 2014-10-19 DIAGNOSIS — M4806 Spinal stenosis, lumbar region: Secondary | ICD-10-CM | POA: Diagnosis not present

## 2014-10-28 ENCOUNTER — Encounter: Payer: Self-pay | Admitting: Internal Medicine

## 2014-10-28 ENCOUNTER — Ambulatory Visit (INDEPENDENT_AMBULATORY_CARE_PROVIDER_SITE_OTHER): Payer: Commercial Managed Care - HMO | Admitting: Internal Medicine

## 2014-10-28 ENCOUNTER — Telehealth: Payer: Self-pay | Admitting: Internal Medicine

## 2014-10-28 VITALS — BP 180/90 | HR 65 | Temp 98.5°F | Wt 175.0 lb

## 2014-10-28 DIAGNOSIS — I10 Essential (primary) hypertension: Secondary | ICD-10-CM | POA: Diagnosis not present

## 2014-10-28 DIAGNOSIS — G309 Alzheimer's disease, unspecified: Secondary | ICD-10-CM

## 2014-10-28 DIAGNOSIS — R296 Repeated falls: Secondary | ICD-10-CM | POA: Diagnosis not present

## 2014-10-28 DIAGNOSIS — I2510 Atherosclerotic heart disease of native coronary artery without angina pectoris: Secondary | ICD-10-CM

## 2014-10-28 DIAGNOSIS — E538 Deficiency of other specified B group vitamins: Secondary | ICD-10-CM

## 2014-10-28 DIAGNOSIS — I2583 Coronary atherosclerosis due to lipid rich plaque: Secondary | ICD-10-CM

## 2014-10-28 DIAGNOSIS — N3090 Cystitis, unspecified without hematuria: Secondary | ICD-10-CM | POA: Diagnosis not present

## 2014-10-28 DIAGNOSIS — F0280 Dementia in other diseases classified elsewhere without behavioral disturbance: Secondary | ICD-10-CM | POA: Insufficient documentation

## 2014-10-28 DIAGNOSIS — G4700 Insomnia, unspecified: Secondary | ICD-10-CM

## 2014-10-28 DIAGNOSIS — R29898 Other symptoms and signs involving the musculoskeletal system: Secondary | ICD-10-CM

## 2014-10-28 MED ORDER — CIPROFLOXACIN HCL 250 MG PO TABS
250.0000 mg | ORAL_TABLET | Freq: Two times a day (BID) | ORAL | Status: DC
Start: 2014-10-28 — End: 2014-12-02

## 2014-10-28 MED ORDER — CYANOCOBALAMIN 1000 MCG/ML IJ SOLN
1000.0000 ug | Freq: Once | INTRAMUSCULAR | Status: AC
  Administered 2014-10-28: 1000 ug via INTRAMUSCULAR

## 2014-10-28 NOTE — Patient Instructions (Addendum)
Please stop Lipitor (atorvastatin)!  Use Valerian Root at bedtime for sleep

## 2014-10-28 NOTE — Progress Notes (Signed)
Subjective:    HPI   C/o more falls; went to [facility] ER 1-2 mo ago. She has not stopped Lipitor yet, however...  C/o memory loss  F/u dizziness, memory loss - she had a head CT 1-2 mo ago. C/o GERD  She ran out of her DM and HTN meds  The patient presents for a follow-up of  chronic hypertension, chronic dyslipidemia, type 2 diabetes, CAD, CHF, depression   F/u vit def - not sure if she is taking them right    Wt Readings from Last 3 Encounters:  10/28/14 175 lb (79.379 kg)  10/12/14 178 lb (80.74 kg)  09/05/14 176 lb 6.4 oz (80.015 kg)   BP Readings from Last 3 Encounters:  10/28/14 180/90  10/12/14 150/100  09/05/14 152/70     BP 180/90 mmHg  Pulse 65  Temp(Src) 98.5 F (36.9 C) (Oral)  Wt 175 lb (79.379 kg)  SpO2 98%     Review of Systems  Constitutional: Positive for fatigue. Negative for fever, chills, activity change, appetite change and unexpected weight change.  HENT: Negative for congestion, mouth sores and sinus pressure.   Eyes: Negative for visual disturbance.  Respiratory: Positive for shortness of breath (mild). Negative for cough and chest tightness.   Cardiovascular: Negative for palpitations and leg swelling.  Gastrointestinal: Negative for nausea, vomiting and abdominal pain.  Genitourinary: Negative for frequency, difficulty urinating and vaginal pain.  Musculoskeletal: Negative for myalgias, back pain and gait problem.  Skin: Negative for pallor and rash.  Neurological: Negative for dizziness, tremors, weakness, numbness and headaches.  Psychiatric/Behavioral: Negative for confusion and sleep disturbance. The patient is not hyperactive.        Objective:   Physical Exam  Constitutional: She appears well-developed. No distress.  NAD Obese  HENT:  Head: Normocephalic.  Right Ear: External ear normal.  Left Ear: External ear normal.  Nose: Nose normal.  Mouth/Throat: Oropharynx is clear and moist.  Eyes: Conjunctivae are  normal. Pupils are equal, round, and reactive to light. Right eye exhibits no discharge. Left eye exhibits no discharge.  Neck: Normal range of motion. Neck supple. No JVD present. No tracheal deviation present. No thyromegaly present.  Cardiovascular: Normal rate, regular rhythm and normal heart sounds.   Pulmonary/Chest: No stridor. No respiratory distress. She has no wheezes.  Abdominal: Soft. Bowel sounds are normal. She exhibits no distension and no mass. There is no tenderness. There is no rebound and no guarding.  Musculoskeletal: She exhibits no edema or tenderness.  Lymphadenopathy:    She has no cervical adenopathy.  Neurological: She displays normal reflexes. No cranial nerve deficit. She exhibits normal muscle tone. Coordination normal.  Skin: No rash noted. No erythema.  Psychiatric: She has a normal mood and affect. Her behavior is normal. Judgment and thought content normal.  Weak LE muscles Grip (5-) B A/o/c 100-7=?  Lab Results  Component Value Date   WBC 6.3 10/12/2014   HGB 12.7 10/12/2014   HCT 37.2 10/12/2014   PLT 199.0 10/12/2014   GLUCOSE 141* 10/12/2014   CHOL 165 09/05/2014   TRIG 93 09/05/2014   HDL 70 09/05/2014   LDLDIRECT 180.7 12/09/2011   LDLCALC 76 09/05/2014   ALT 10 10/12/2014   AST 13 10/12/2014   NA 140 10/12/2014   K 4.4 10/12/2014   CL 105 10/12/2014   CREATININE 1.13 10/12/2014   BUN 19 10/12/2014   CO2 31 10/12/2014   TSH 1.56 10/12/2014   INR 0.97 06/08/2014  HGBA1C 7.1* 10/12/2014       Assessment & Plan:

## 2014-10-28 NOTE — Assessment & Plan Note (Signed)
2015 per husband Consider Aricept to start next OV. Treat insomnia RTC 1 mo

## 2014-10-28 NOTE — Assessment & Plan Note (Signed)
SQ Vit [name] today

## 2014-10-28 NOTE — Assessment & Plan Note (Signed)
Cipro x5 d

## 2014-10-28 NOTE — Assessment & Plan Note (Signed)
Take meds now

## 2014-10-28 NOTE — Assessment & Plan Note (Signed)
D/c Lipitor 

## 2014-10-28 NOTE — Assessment & Plan Note (Signed)
Imdur, ASA and Coreg to cont D/c Lipitor

## 2014-10-28 NOTE — Telephone Encounter (Signed)
emmi mailed  °

## 2014-10-28 NOTE — Progress Notes (Signed)
Pre visit review using our clinic review tool, if applicable. No additional management support is needed unless otherwise documented below in the visit note. 

## 2014-10-28 NOTE — Assessment & Plan Note (Signed)
Use Valerian Root at bedtime for sleep

## 2014-11-03 DIAGNOSIS — M4806 Spinal stenosis, lumbar region: Secondary | ICD-10-CM | POA: Diagnosis not present

## 2014-12-02 ENCOUNTER — Other Ambulatory Visit (INDEPENDENT_AMBULATORY_CARE_PROVIDER_SITE_OTHER): Payer: Commercial Managed Care - HMO

## 2014-12-02 ENCOUNTER — Ambulatory Visit (INDEPENDENT_AMBULATORY_CARE_PROVIDER_SITE_OTHER): Payer: Commercial Managed Care - HMO | Admitting: Internal Medicine

## 2014-12-02 ENCOUNTER — Encounter: Payer: Self-pay | Admitting: Internal Medicine

## 2014-12-02 VITALS — BP 160/84 | HR 66 | Wt 178.0 lb

## 2014-12-02 DIAGNOSIS — M899 Disorder of bone, unspecified: Secondary | ICD-10-CM

## 2014-12-02 DIAGNOSIS — I2583 Coronary atherosclerosis due to lipid rich plaque: Secondary | ICD-10-CM

## 2014-12-02 DIAGNOSIS — I2510 Atherosclerotic heart disease of native coronary artery without angina pectoris: Secondary | ICD-10-CM

## 2014-12-02 DIAGNOSIS — R296 Repeated falls: Secondary | ICD-10-CM | POA: Diagnosis not present

## 2014-12-02 DIAGNOSIS — E1022 Type 1 diabetes mellitus with diabetic chronic kidney disease: Secondary | ICD-10-CM

## 2014-12-02 DIAGNOSIS — R29898 Other symptoms and signs involving the musculoskeletal system: Secondary | ICD-10-CM | POA: Diagnosis not present

## 2014-12-02 DIAGNOSIS — I10 Essential (primary) hypertension: Secondary | ICD-10-CM

## 2014-12-02 DIAGNOSIS — M949 Disorder of cartilage, unspecified: Secondary | ICD-10-CM

## 2014-12-02 DIAGNOSIS — E538 Deficiency of other specified B group vitamins: Secondary | ICD-10-CM

## 2014-12-02 DIAGNOSIS — I5021 Acute systolic (congestive) heart failure: Secondary | ICD-10-CM | POA: Diagnosis not present

## 2014-12-02 DIAGNOSIS — N189 Chronic kidney disease, unspecified: Secondary | ICD-10-CM

## 2014-12-02 LAB — CBC WITH DIFFERENTIAL/PLATELET
BASOS ABS: 0 10*3/uL (ref 0.0–0.1)
Basophils Relative: 0.6 % (ref 0.0–3.0)
Eosinophils Absolute: 0.2 10*3/uL (ref 0.0–0.7)
Eosinophils Relative: 2.6 % (ref 0.0–5.0)
HCT: 36 % (ref 36.0–46.0)
Hemoglobin: 12.3 g/dL (ref 12.0–15.0)
LYMPHS ABS: 1.6 10*3/uL (ref 0.7–4.0)
LYMPHS PCT: 27.4 % (ref 12.0–46.0)
MCHC: 34.1 g/dL (ref 30.0–36.0)
MCV: 90.1 fl (ref 78.0–100.0)
MONOS PCT: 9.9 % (ref 3.0–12.0)
Monocytes Absolute: 0.6 10*3/uL (ref 0.1–1.0)
Neutro Abs: 3.5 10*3/uL (ref 1.4–7.7)
Neutrophils Relative %: 59.5 % (ref 43.0–77.0)
Platelets: 188 10*3/uL (ref 150.0–400.0)
RBC: 3.99 Mil/uL (ref 3.87–5.11)
RDW: 13.6 % (ref 11.5–15.5)
WBC: 5.9 10*3/uL (ref 4.0–10.5)

## 2014-12-02 LAB — BASIC METABOLIC PANEL
BUN: 23 mg/dL (ref 6–23)
CALCIUM: 9.5 mg/dL (ref 8.4–10.5)
CO2: 32 mEq/L (ref 19–32)
Chloride: 101 mEq/L (ref 96–112)
Creatinine, Ser: 1.34 mg/dL — ABNORMAL HIGH (ref 0.40–1.20)
GFR: 39.8 mL/min — AB (ref >60.00)
Glucose, Bld: 188 mg/dL — ABNORMAL HIGH (ref 70–99)
POTASSIUM: 4.2 meq/L (ref 3.5–5.1)
SODIUM: 140 meq/L (ref 135–145)

## 2014-12-02 MED ORDER — VITAMIN B-12 500 MCG SL SUBL: 2.0000 | SUBLINGUAL_TABLET | SUBLINGUAL | Status: DC

## 2014-12-02 MED ORDER — CYANOCOBALAMIN 1000 MCG/ML IJ SOLN
1000.0000 ug | Freq: Once | INTRAMUSCULAR | Status: AC
  Administered 2014-12-02: 1000 ug via INTRAMUSCULAR

## 2014-12-02 MED ORDER — FUROSEMIDE 40 MG PO TABS: 40.0000 mg | ORAL_TABLET | Freq: Every day | ORAL | Status: DC

## 2014-12-02 MED ORDER — CITALOPRAM HYDROBROMIDE 20 MG PO TABS: 20.0000 mg | ORAL_TABLET | Freq: Every day | ORAL | Status: DC

## 2014-12-02 MED ORDER — OMEPRAZOLE 20 MG PO CPDR: 20.0000 mg | DELAYED_RELEASE_CAPSULE | Freq: Every day | ORAL | Status: DC

## 2014-12-02 MED ORDER — ATORVASTATIN CALCIUM 40 MG PO TABS: 40.0000 mg | ORAL_TABLET | Freq: Every day | ORAL | Status: DC

## 2014-12-02 NOTE — Progress Notes (Signed)
Subjective:    HPI   F/u falls; went to [facility] ER 3-4 mo ago. She has not stopped Lipitor yet, however...  C/o memory loss  F/u dizziness, memory loss - she had a head CT 1-2 mo ago. C/o GERD  She ran out of her DM and HTN meds  The patient presents for a follow-up of  chronic hypertension, chronic dyslipidemia, type 2 diabetes, CAD, CHF, depression   F/u vit def - not sure if she is taking them right    Wt Readings from Last 3 Encounters:  10/28/14 175 lb (79.379 kg)  10/12/14 178 lb (80.74 kg)  09/05/14 176 lb 6.4 oz (80.015 kg)   BP Readings from Last 3 Encounters:  10/28/14 180/90  10/12/14 150/100  09/05/14 152/70     There were no vitals taken for this visit.     Review of Systems  Constitutional: Positive for fatigue. Negative for fever, chills, activity change, appetite change and unexpected weight change.  HENT: Negative for congestion, mouth sores and sinus pressure.   Eyes: Negative for visual disturbance.  Respiratory: Positive for shortness of breath (mild). Negative for cough and chest tightness.   Cardiovascular: Negative for palpitations and leg swelling.  Gastrointestinal: Negative for nausea, vomiting and abdominal pain.  Genitourinary: Negative for frequency, difficulty urinating and vaginal pain.  Musculoskeletal: Negative for myalgias, back pain and gait problem.  Skin: Negative for pallor and rash.  Neurological: Negative for dizziness, tremors, weakness, numbness and headaches.  Psychiatric/Behavioral: Negative for confusion and sleep disturbance. The patient is not hyperactive.        Objective:   Physical Exam  Constitutional: She appears well-developed. No distress.  NAD Obese  HENT:  Head: Normocephalic.  Right Ear: External ear normal.  Left Ear: External ear normal.  Nose: Nose normal.  Mouth/Throat: Oropharynx is clear and moist.  Eyes: Conjunctivae are normal. Pupils are equal, round, and reactive to light. Right  eye exhibits no discharge. Left eye exhibits no discharge.  Neck: Normal range of motion. Neck supple. No JVD present. No tracheal deviation present. No thyromegaly present.  Cardiovascular: Normal rate, regular rhythm and normal heart sounds.   Pulmonary/Chest: No stridor. No respiratory distress. She has no wheezes.  Abdominal: Soft. Bowel sounds are normal. She exhibits no distension and no mass. There is no tenderness. There is no rebound and no guarding.  Musculoskeletal: She exhibits no edema or tenderness.  Lymphadenopathy:    She has no cervical adenopathy.  Neurological: She displays normal reflexes. No cranial nerve deficit. She exhibits normal muscle tone. Coordination normal.  Skin: No rash noted. No erythema.  Psychiatric: She has a normal mood and affect. Her behavior is normal. Judgment and thought content normal.  Weak LE muscles Grip (5-) B A/o/c 100-7=?  Lab Results  Component Value Date   WBC 6.3 10/12/2014   HGB 12.7 10/12/2014   HCT 37.2 10/12/2014   PLT 199.0 10/12/2014   GLUCOSE 141* 10/12/2014   CHOL 165 09/05/2014   TRIG 93 09/05/2014   HDL 70 09/05/2014   LDLDIRECT 180.7 12/09/2011   LDLCALC 76 09/05/2014   ALT 10 10/12/2014   AST 13 10/12/2014   NA 140 10/12/2014   K 4.4 10/12/2014   CL 105 10/12/2014   CREATININE 1.13 10/12/2014   BUN 19 10/12/2014   CO2 31 10/12/2014   TSH 1.56 10/12/2014   INR 0.97 06/08/2014   HGBA1C 7.1* 10/12/2014       Assessment & Plan:

## 2014-12-02 NOTE — Assessment & Plan Note (Signed)
Re-start IM Vit [name] Risks associated with treatment noncompliance were discussed. Compliance was encouraged.

## 2014-12-02 NOTE — Patient Instructions (Signed)
Please, take Vitamin D and Vitamin [name] daily Do not take Lipitor (atorvastatin) for 1 month

## 2014-12-02 NOTE — Assessment & Plan Note (Signed)
Take [name] Hold Lipitor

## 2014-12-02 NOTE — Assessment & Plan Note (Signed)
No edema On Coreg, Avapro, Lasix Risks associated with treatment noncompliance were discussed. Compliance was encouraged.

## 2014-12-02 NOTE — Assessment & Plan Note (Signed)
On Coreg, Avapro, Lasix

## 2014-12-02 NOTE — Progress Notes (Signed)
Pre visit review using our clinic review tool, if applicable. No additional management support is needed unless otherwise documented below in the visit note. 

## 2014-12-04 ENCOUNTER — Other Ambulatory Visit: Payer: Self-pay | Admitting: Internal Medicine

## 2014-12-04 MED ORDER — IRBESARTAN 150 MG PO TABS
ORAL_TABLET | ORAL | Status: DC
Start: 2014-12-04 — End: 2015-04-28

## 2014-12-19 NOTE — Telephone Encounter (Signed)
Encounter closed

## 2015-01-30 ENCOUNTER — Encounter (HOSPITAL_COMMUNITY): Payer: Self-pay | Admitting: Family Medicine

## 2015-01-30 ENCOUNTER — Other Ambulatory Visit (HOSPITAL_COMMUNITY): Payer: Self-pay

## 2015-01-30 ENCOUNTER — Inpatient Hospital Stay (HOSPITAL_COMMUNITY)
Admission: EM | Admit: 2015-01-30 | Discharge: 2015-02-05 | DRG: 292 | Disposition: A | Discharge status: Home / Self Care | Payer: Commercial Managed Care - HMO | Attending: Cardiology | Admitting: Cardiology

## 2015-01-30 ENCOUNTER — Emergency Department (HOSPITAL_COMMUNITY): Payer: Commercial Managed Care - HMO

## 2015-01-30 DIAGNOSIS — Z955 Presence of coronary angioplasty implant and graft: Secondary | ICD-10-CM

## 2015-01-30 DIAGNOSIS — Z8673 Personal history of transient ischemic attack (TIA), and cerebral infarction without residual deficits: Secondary | ICD-10-CM

## 2015-01-30 DIAGNOSIS — J811 Chronic pulmonary edema: Secondary | ICD-10-CM | POA: Diagnosis not present

## 2015-01-30 DIAGNOSIS — M549 Dorsalgia, unspecified: Secondary | ICD-10-CM | POA: Diagnosis not present

## 2015-01-30 DIAGNOSIS — I2510 Atherosclerotic heart disease of native coronary artery without angina pectoris: Secondary | ICD-10-CM | POA: Diagnosis present

## 2015-01-30 DIAGNOSIS — I509 Heart failure, unspecified: Secondary | ICD-10-CM | POA: Diagnosis not present

## 2015-01-30 DIAGNOSIS — K5900 Constipation, unspecified: Secondary | ICD-10-CM | POA: Diagnosis not present

## 2015-01-30 DIAGNOSIS — G4733 Obstructive sleep apnea (adult) (pediatric): Secondary | ICD-10-CM | POA: Diagnosis present

## 2015-01-30 DIAGNOSIS — R0602 Shortness of breath: Secondary | ICD-10-CM | POA: Diagnosis not present

## 2015-01-30 DIAGNOSIS — I252 Old myocardial infarction: Secondary | ICD-10-CM

## 2015-01-30 DIAGNOSIS — T402X5A Adverse effect of other opioids, initial encounter: Secondary | ICD-10-CM | POA: Diagnosis not present

## 2015-01-30 DIAGNOSIS — R0781 Pleurodynia: Secondary | ICD-10-CM | POA: Diagnosis not present

## 2015-01-30 DIAGNOSIS — Z888 Allergy status to other drugs, medicaments and biological substances status: Secondary | ICD-10-CM | POA: Diagnosis not present

## 2015-01-30 DIAGNOSIS — E1122 Type 2 diabetes mellitus with diabetic chronic kidney disease: Secondary | ICD-10-CM | POA: Diagnosis present

## 2015-01-30 DIAGNOSIS — R001 Bradycardia, unspecified: Secondary | ICD-10-CM | POA: Diagnosis not present

## 2015-01-30 DIAGNOSIS — I10 Essential (primary) hypertension: Secondary | ICD-10-CM | POA: Diagnosis present

## 2015-01-30 DIAGNOSIS — M546 Pain in thoracic spine: Secondary | ICD-10-CM | POA: Diagnosis present

## 2015-01-30 DIAGNOSIS — I5043 Acute on chronic combined systolic (congestive) and diastolic (congestive) heart failure: Principal | ICD-10-CM | POA: Diagnosis present

## 2015-01-30 DIAGNOSIS — I5033 Acute on chronic diastolic (congestive) heart failure: Secondary | ICD-10-CM

## 2015-01-30 DIAGNOSIS — Z8249 Family history of ischemic heart disease and other diseases of the circulatory system: Secondary | ICD-10-CM

## 2015-01-30 DIAGNOSIS — I129 Hypertensive chronic kidney disease with stage 1 through stage 4 chronic kidney disease, or unspecified chronic kidney disease: Secondary | ICD-10-CM | POA: Diagnosis not present

## 2015-01-30 DIAGNOSIS — R0600 Dyspnea, unspecified: Secondary | ICD-10-CM | POA: Diagnosis not present

## 2015-01-30 DIAGNOSIS — Z7982 Long term (current) use of aspirin: Secondary | ICD-10-CM

## 2015-01-30 DIAGNOSIS — Z833 Family history of diabetes mellitus: Secondary | ICD-10-CM

## 2015-01-30 DIAGNOSIS — S299XXA Unspecified injury of thorax, initial encounter: Secondary | ICD-10-CM | POA: Diagnosis not present

## 2015-01-30 DIAGNOSIS — M8580 Other specified disorders of bone density and structure, unspecified site: Secondary | ICD-10-CM | POA: Diagnosis present

## 2015-01-30 DIAGNOSIS — W19XXXA Unspecified fall, initial encounter: Secondary | ICD-10-CM

## 2015-01-30 DIAGNOSIS — E785 Hyperlipidemia, unspecified: Secondary | ICD-10-CM | POA: Diagnosis present

## 2015-01-30 DIAGNOSIS — E119 Type 2 diabetes mellitus without complications: Secondary | ICD-10-CM

## 2015-01-30 DIAGNOSIS — N189 Chronic kidney disease, unspecified: Secondary | ICD-10-CM | POA: Diagnosis present

## 2015-01-30 DIAGNOSIS — I952 Hypotension due to drugs: Secondary | ICD-10-CM | POA: Diagnosis not present

## 2015-01-30 DIAGNOSIS — N179 Acute kidney failure, unspecified: Secondary | ICD-10-CM | POA: Diagnosis not present

## 2015-01-30 DIAGNOSIS — N289 Disorder of kidney and ureter, unspecified: Secondary | ICD-10-CM

## 2015-01-30 DIAGNOSIS — G4730 Sleep apnea, unspecified: Secondary | ICD-10-CM | POA: Diagnosis present

## 2015-01-30 DIAGNOSIS — I255 Ischemic cardiomyopathy: Secondary | ICD-10-CM | POA: Diagnosis not present

## 2015-01-30 DIAGNOSIS — I5021 Acute systolic (congestive) heart failure: Secondary | ICD-10-CM | POA: Diagnosis not present

## 2015-01-30 DIAGNOSIS — N183 Chronic kidney disease, stage 3 (moderate): Secondary | ICD-10-CM | POA: Diagnosis not present

## 2015-01-30 DIAGNOSIS — I5042 Chronic combined systolic (congestive) and diastolic (congestive) heart failure: Secondary | ICD-10-CM | POA: Insufficient documentation

## 2015-01-30 DIAGNOSIS — I25111 Atherosclerotic heart disease of native coronary artery with angina pectoris with documented spasm: Secondary | ICD-10-CM | POA: Diagnosis not present

## 2015-01-30 DIAGNOSIS — I517 Cardiomegaly: Secondary | ICD-10-CM | POA: Diagnosis not present

## 2015-01-30 DIAGNOSIS — Z9861 Coronary angioplasty status: Secondary | ICD-10-CM

## 2015-01-30 DIAGNOSIS — Z9989 Dependence on other enabling machines and devices: Secondary | ICD-10-CM

## 2015-01-30 HISTORY — DX: Frequency of micturition: R35.0

## 2015-01-30 HISTORY — DX: Cerebral aneurysm, nonruptured: I67.1

## 2015-01-30 LAB — GLUCOSE, CAPILLARY: Glucose-Capillary: 232 mg/dL — ABNORMAL HIGH (ref 65–99)

## 2015-01-30 LAB — COMPREHENSIVE METABOLIC PANEL
ALT: 13 U/L — ABNORMAL LOW (ref 14–54)
AST: 16 U/L (ref 15–41)
Albumin: 3.3 g/dL — ABNORMAL LOW (ref 3.5–5.0)
Alkaline Phosphatase: 76 U/L (ref 38–126)
Anion gap: 7 (ref 5–15)
BUN: 13 mg/dL (ref 6–20)
CALCIUM: 9.1 mg/dL (ref 8.9–10.3)
CO2: 25 mmol/L (ref 22–32)
Chloride: 107 mmol/L (ref 101–111)
Creatinine, Ser: 1.11 mg/dL — ABNORMAL HIGH (ref 0.44–1.00)
GFR calc Af Amer: 51 mL/min — ABNORMAL LOW (ref >60)
GFR calc non Af Amer: 44 mL/min — ABNORMAL LOW (ref >60)
GLUCOSE: 183 mg/dL — AB (ref 65–99)
Potassium: 3.7 mmol/L (ref 3.5–5.1)
SODIUM: 139 mmol/L (ref 135–145)
Total Bilirubin: 0.6 mg/dL (ref 0.3–1.2)
Total Protein: 6.6 g/dL (ref 6.5–8.1)

## 2015-01-30 LAB — CBC WITH DIFFERENTIAL/PLATELET
BASOS ABS: 0 10*3/uL (ref 0.0–0.1)
BASOS PCT: 0 % (ref 0–1)
Eosinophils Absolute: 0.1 10*3/uL (ref 0.0–0.7)
Eosinophils Relative: 2 % (ref 0–5)
HEMATOCRIT: 35.3 % — AB (ref 36.0–46.0)
Hemoglobin: 12.1 g/dL (ref 12.0–15.0)
LYMPHS ABS: 1.7 10*3/uL (ref 0.7–4.0)
LYMPHS PCT: 27 % (ref 12–46)
MCH: 30.9 pg (ref 26.0–34.0)
MCHC: 34.3 g/dL (ref 30.0–36.0)
MCV: 90.3 fL (ref 78.0–100.0)
MONO ABS: 0.5 10*3/uL (ref 0.1–1.0)
Monocytes Relative: 8 % (ref 3–12)
Neutro Abs: 4 10*3/uL (ref 1.7–7.7)
Neutrophils Relative %: 63 % (ref 43–77)
Platelets: 179 10*3/uL (ref 150–400)
RBC: 3.91 MIL/uL (ref 3.87–5.11)
RDW: 12.6 % (ref 11.5–15.5)
WBC: 6.3 10*3/uL (ref 4.0–10.5)

## 2015-01-30 LAB — BRAIN NATRIURETIC PEPTIDE: B Natriuretic Peptide: 508.3 pg/mL — ABNORMAL HIGH (ref 0.0–100.0)

## 2015-01-30 LAB — TROPONIN I
Troponin I: 0.03 ng/mL (ref <0.031)
Troponin I: 0.03 ng/mL (ref <0.031)
Troponin I: 0.05 ng/mL — ABNORMAL HIGH (ref <0.031)

## 2015-01-30 MED ORDER — FUROSEMIDE 10 MG/ML IJ SOLN
60.0000 mg | Freq: Two times a day (BID) | INTRAMUSCULAR | Status: DC
  Administered 2015-01-31 – 2015-02-03 (×7): 60 mg via INTRAVENOUS
  Filled 2015-01-30 (×9): qty 6

## 2015-01-30 MED ORDER — FLUTICASONE PROPIONATE 50 MCG/ACT NA SUSP
1.0000 | Freq: Every day | NASAL | Status: DC | PRN
  Filled 2015-01-30: qty 16

## 2015-01-30 MED ORDER — SODIUM CHLORIDE 0.9 % IJ SOLN
3.0000 mL | Freq: Two times a day (BID) | INTRAMUSCULAR | Status: DC
  Administered 2015-01-30 – 2015-02-04 (×10): 3 mL via INTRAVENOUS
  Administered 2015-02-04: 10 mL via INTRAVENOUS

## 2015-01-30 MED ORDER — SODIUM CHLORIDE 0.9 % IV SOLN: 250.0000 mL | INTRAVENOUS | Status: DC | PRN

## 2015-01-30 MED ORDER — ISOSORBIDE MONONITRATE ER 60 MG PO TB24
60.0000 mg | ORAL_TABLET | Freq: Every day | ORAL | Status: DC
  Administered 2015-01-30 – 2015-02-05 (×7): 60 mg via ORAL
  Filled 2015-01-30 (×7): qty 1

## 2015-01-30 MED ORDER — INSULIN ASPART 100 UNIT/ML ~~LOC~~ SOLN
0.0000 [IU] | Freq: Three times a day (TID) | SUBCUTANEOUS | Status: DC
  Administered 2015-01-31: 2 [IU] via SUBCUTANEOUS
  Administered 2015-01-31: 5 [IU] via SUBCUTANEOUS
  Administered 2015-01-31 – 2015-02-01 (×2): 3 [IU] via SUBCUTANEOUS
  Administered 2015-02-01 – 2015-02-02 (×3): 2 [IU] via SUBCUTANEOUS
  Administered 2015-02-02: 3 [IU] via SUBCUTANEOUS
  Administered 2015-02-02: 1 [IU] via SUBCUTANEOUS
  Administered 2015-02-03 (×2): 2 [IU] via SUBCUTANEOUS
  Administered 2015-02-03: 7 [IU] via SUBCUTANEOUS
  Administered 2015-02-04: 2 [IU] via SUBCUTANEOUS
  Administered 2015-02-04: 3 [IU] via SUBCUTANEOUS
  Administered 2015-02-04: 2 [IU] via SUBCUTANEOUS

## 2015-01-30 MED ORDER — ACETAMINOPHEN 325 MG PO TABS
650.0000 mg | ORAL_TABLET | ORAL | Status: DC | PRN
  Administered 2015-01-31 – 2015-02-05 (×9): 650 mg via ORAL
  Filled 2015-01-30 (×11): qty 2

## 2015-01-30 MED ORDER — SODIUM CHLORIDE 0.9 % IJ SOLN: 3.0000 mL | INTRAMUSCULAR | Status: DC | PRN

## 2015-01-30 MED ORDER — ONDANSETRON HCL 4 MG/2ML IJ SOLN
4.0000 mg | Freq: Four times a day (QID) | INTRAMUSCULAR | Status: DC | PRN
  Administered 2015-02-01 (×2): 4 mg via INTRAVENOUS
  Filled 2015-01-30 (×2): qty 2

## 2015-01-30 MED ORDER — CARVEDILOL 6.25 MG PO TABS
6.2500 mg | ORAL_TABLET | Freq: Two times a day (BID) | ORAL | Status: DC
  Administered 2015-01-30 – 2015-02-03 (×8): 6.25 mg via ORAL
  Filled 2015-01-30 (×10): qty 1

## 2015-01-30 MED ORDER — CITALOPRAM HYDROBROMIDE 20 MG PO TABS
20.0000 mg | ORAL_TABLET | Freq: Every day | ORAL | Status: DC
  Administered 2015-01-30 – 2015-02-05 (×7): 20 mg via ORAL
  Filled 2015-01-30 (×7): qty 1

## 2015-01-30 MED ORDER — VITAMIN D3 25 MCG (1000 UNIT) PO TABS
1000.0000 [IU] | ORAL_TABLET | Freq: Every day | ORAL | Status: DC
  Administered 2015-01-31 – 2015-02-05 (×6): 1000 [IU] via ORAL
  Filled 2015-01-30 (×6): qty 1

## 2015-01-30 MED ORDER — ATORVASTATIN CALCIUM 40 MG PO TABS
40.0000 mg | ORAL_TABLET | Freq: Every day | ORAL | Status: DC
  Administered 2015-01-30 – 2015-02-04 (×6): 40 mg via ORAL
  Filled 2015-01-30 (×7): qty 1

## 2015-01-30 MED ORDER — HEPARIN SODIUM (PORCINE) 5000 UNIT/ML IJ SOLN
5000.0000 [IU] | Freq: Three times a day (TID) | INTRAMUSCULAR | Status: DC
  Administered 2015-01-30 – 2015-02-05 (×17): 5000 [IU] via SUBCUTANEOUS
  Filled 2015-01-30 (×25): qty 1

## 2015-01-30 MED ORDER — IRBESARTAN 150 MG PO TABS
150.0000 mg | ORAL_TABLET | Freq: Every day | ORAL | Status: DC
  Administered 2015-01-31 – 2015-02-05 (×6): 150 mg via ORAL
  Filled 2015-01-30 (×6): qty 1

## 2015-01-30 MED ORDER — POTASSIUM CHLORIDE CRYS ER 20 MEQ PO TBCR
20.0000 meq | EXTENDED_RELEASE_TABLET | Freq: Two times a day (BID) | ORAL | Status: DC
  Administered 2015-01-30 – 2015-02-04 (×9): 20 meq via ORAL
  Filled 2015-01-30 (×13): qty 1

## 2015-01-30 MED ORDER — ASPIRIN 81 MG PO TBEC
81.0000 mg | DELAYED_RELEASE_TABLET | Freq: Every day | ORAL | Status: DC
  Administered 2015-01-30 – 2015-02-05 (×7): 81 mg via ORAL
  Filled 2015-01-30 (×7): qty 1

## 2015-01-30 NOTE — H&P (Signed)
Patient ID: Mary Cook MRN: [id number], DOB/AGE: 03-28-1926   Admit date: 01/30/2015   Primary Physician: Walker Kehr, MD Primary Cardiologist: Dr. Claiborne Billings  Pt. Profile:  Mary Cook is a 81 y.o. female with a history of CAD, HTN, OA, diverticulitis, OSA on CPAP, diabetes, stroke, thrombosis with thrombectomy, hyperlipidemia, gastric bleed who presented to [facility] for evaluation of SOB.  HPI:  Mary Cook has known coronary artery disease. In September 2011 suffered a large out of hospital anterior wall myocardial infarction and post MI pericarditis. At that time, she had significant thrombus burden requiring thrombectomy. Sheunderwent stenting of her left circumflex coronary artery with tandem 3.015 mm Promus stents postdilated 3.5 mm meters. She had concomitant CAD involving her diagonal branch of her LAD as well as RCA.   Most recent cath 06/08/2014 showed moderate LV dysfunction with global hypokinesis and slightly more pronounced inferior hypocontractility. Ejection fraction was 35%.There was moderate CAD with 50-60% smooth eccentric stenosis in the first diagonal branch of the LAD, widely patent proximal tandem stents in the circumflex vessel with a focal 70% stenosis of a small AV groove circumflex, and 50-60% stenosis in the proximal RCA with mild mid systolic bridging in a dominant RCA system. Increased medical therapy was recommended.   Patient presents with complaints of worsening dyspnea on exertion for approximately one month. She has chronic orthopnea but no PND. She denies pedal edema. She has not had chest pain. She denies fevers, productive cough or hemoptysis. She does describe weight gain over the past several months of 20-30 pounds.  Problem List  Past Medical History  Diagnosis Date  . Hypertension   . Osteoarthritis   . Osteopenia   . Diverticulosis of colon   . Diabetes mellitus     type II  . CAD (coronary artery disease)   . MI  (myocardial infarction) 05/10/2010    Dr Claiborne Billings, Rx'd with CFX  DES  . Stroke 2002  . HYPERLIPIDEMIA 01/07/2008  . GIB (gastrointestinal bleeding) 2002    Diverticular  . Skin disorder   . Visual disorder   . Thrombus   . Sleep apnea     cpap therapy    Past Surgical History  Procedure Laterality Date  . Abdominal hysterectomy  1980    no bso  . Coronary angioplasty with stent placement  05/11/2010    stent CFX  . Cardiac catheterization  07/2011    LAD OK, D1 80%, CFX 30% w/ patent stent, RCA 50-60%, EF 40%  . Thrombectomy    . Cardiac catheterization    . Doppler echocardiography    . Myocardial perfusion study    . Left heart catheterization with coronary angiogram N/A 08/18/2011    Procedure: LEFT HEART CATHETERIZATION WITH CORONARY ANGIOGRAM;  Surgeon: Wellington Hampshire, MD;  Location: [facility] CATH LAB;  Service: Cardiovascular;  Laterality: N/A;  . Left heart catheterization with coronary angiogram N/A 06/08/2014    Procedure: LEFT HEART CATHETERIZATION WITH CORONARY ANGIOGRAM;  Surgeon: Troy Sine, MD;  Location: [facility] C[facility];  Service: Cardiovascular;  Laterality: N/A;  . Tonsillectomy    . Appendectomy       Allergies  Allergies  Allergen Reactions  . Clopidogrel Bisulfate Nausea And Vomiting    Patient is not aware of this allergy  . Lipitor [Atorvastatin]     Weak legs  . Lisinopril Cough    Patient is not aware of this allergy     Home Medications  Prior to Admission  medications   Medication Sig Start Date End Date Taking? Authorizing Provider  ACCU-CHEK AVIVA PLUS test strip  10/21/14  Yes Historical Provider, MD  ACCU-CHEK SOFTCLIX LANCETS lancets  10/21/14  Yes Historical Provider, MD  Alcohol Swabs (B-D SINGLE USE SWABS REGULAR) PADS  10/21/14  Yes Historical Provider, MD  aspirin 81 MG EC tablet Take 81 mg by mouth daily.     Yes Historical Provider, MD  atorvastatin (LIPITOR) 40 MG tablet Take 1 tablet (40 mg total) by mouth daily at 6 PM. 12/02/14  Yes  Aleksei Plotnikov V, MD  carvedilol (COREG) 3.125 MG tablet Take 1 tablet (3.125 mg total) by mouth 2 (two) times daily. 09/05/14  Yes Troy Sine, MD  cholecalciferol (VITAMIN D) 1000 UNITS tablet Take 1 tablet (1,000 Units total) by mouth daily. 09/09/11 01/30/15 Yes Aleksei Plotnikov V, MD  citalopram (CELEXA) 20 MG tablet Take 1 tablet (20 mg total) by mouth daily. 12/02/14  Yes Aleksei Plotnikov V, MD  Cyanocobalamin (VITAMIN B-12) 500 MCG SUBL Place 2 tablets (1,000 mcg total) under the tongue 1 day or 1 dose. 12/02/14  Yes Aleksei Plotnikov V, MD  fluticasone (FLONASE) 50 MCG/ACT nasal spray Place 1 spray into both nostrils daily as needed for allergies or rhinitis.   Yes Historical Provider, MD  furosemide (LASIX) 40 MG tablet Take 1 tablet (40 mg total) by mouth daily. 12/02/14  Yes Aleksei Plotnikov V, MD  irbesartan (AVAPRO) 150 MG tablet Take 1/2 tablet daily 12/04/14  Yes Aleksei Plotnikov V, MD  isosorbide mononitrate (IMDUR) 60 MG 24 hr tablet Take 1 tablet (60 mg total) by mouth daily. 07/05/14  Yes Troy Sine, MD  metFORMIN (GLUCOPHAGE) 500 MG tablet Take 1 tablet (500 mg total) by mouth 2 (two) times daily with a meal. 12/23/13 01/30/15 Yes Aleksei Plotnikov V, MD  omeprazole (PRILOSEC) 20 MG capsule Take 1 capsule (20 mg total) by mouth daily. 12/02/14  Yes Aleksei Plotnikov V, MD  potassium chloride SA (K-DUR,KLOR-CON) 20 MEQ tablet Take 1 tablet (20 mEq total) by mouth 2 (two) times daily. 12/23/13 01/30/15 Yes Aleksei Plotnikov V, MD    Family History  Family History  Problem Relation Age of Onset  . Hypertension Other   . CAD      father   Family Status  Relation Status Death Age  . Mother Deceased 76    DM, CAD  . Father Deceased 54    CAD     Social History  History   Social History  . Marital Status: Married    Spouse Name: N/A  . Number of Children: N/A  . Years of Education: N/A   Occupational History  . Retired    Social History Main Topics  . Smoking  status: Never Smoker   . Smokeless tobacco: Not on file  . Alcohol Use: No  . Drug Use: No  . Sexual Activity: Not on file   Other Topics Concern  . Not on file   Social History Narrative     Review of Systems Patient denies fevers, chills, productive cough, hemoptysis, dysphagia, odynophagia, melanoma, hematochezia, dysuria, hematuria, rash or seizure activity.  All other systems reviewed and are otherwise negative except as noted above.  Physical Exam  Blood pressure 189/90, pulse 75, temperature 98 F (36.7 C), temperature source Oral, resp. rate 20, weight 178 lb (80.74 kg), SpO2 98 %.  General: WD/WN Pleasant, NAD Psych: Normal affect. HEENT: normal with normal eyelids Neuro: Alert and oriented X 3.  Moves all extremities spontaneously. Neck: Supple without bruits or JVD. Lungs:  Resp regular and unlabored, CTA. Heart: RRR no s3, s4, or murmurs. Abdomen: Soft, non-tender, non-distended, BS + x 4.  No HSM 2 + femoral, no bruits Extremities: No chords; 1+ edema. 2+ DP  Labs   Recent Labs  01/30/15 1207  TROPONINI 0.03   Lab Results  Component Value Date   WBC 6.3 01/30/2015   HGB 12.1 01/30/2015   HCT 35.3* 01/30/2015   MCV 90.3 01/30/2015   PLT 179 01/30/2015     Recent Labs Lab 01/30/15 1207  NA 139  K 3.7  CL 107  CO2 25  BUN 13  CREATININE 1.11*  CALCIUM 9.1  PROT 6.6  BILITOT 0.6  ALKPHOS 76  ALT 13*  AST 16  GLUCOSE 183*   Lab Results  Component Value Date   CHOL 165 09/05/2014   HDL 70 09/05/2014   LDLCALC 76 09/05/2014   TRIG 93 09/05/2014   Lab Results  Component Value Date   DDIMER 1.40* 08/17/2011     Radiology/Studies  Dg Chest 2 View  01/30/2015   CLINICAL DATA:  Worsening shortness of breath.  EXAM: CHEST  2 VIEW  COMPARISON:  06/22/2014 and 06/06/2014  FINDINGS: The cardiac silhouette is upper limits of normal to mildly enlarged in size. Thoracic aortic calcification is noted. Mild prominence of the interstitial markings  throughout both lungs is similar to at most minimally increased from 06/06/2014. No segmental airspace consolidation, overt pulmonary edema, pleural effusion, or pneumothorax is identified. Suture anchor is noted in the left humeral head.  IMPRESSION: Borderline cardiomegaly with stable to slightly increased interstitial prominence bilaterally, nonspecific however minimal interstitial edema or other interstitial lung disease is not excluded. No evidence of pneumonia or overt alveolar edema.   Electronically Signed   By: Logan Bores   On: 01/30/2015 10:58   Cath 06/08/2014 ANGIOGRAPHY:  Fluoroscopy revealed coronary calcification and severe mitral annular calcification.  Left main: Moderate size vessel with an upward takeoff that bifurcated into the LAD and left circumflex vessel. There was no stenosis.  LAD: Moderate size vessel which gave rise to a proximal diagonal and mid-diagonal vessel. It was smooth 50-70% eccentric narrowing in the proximal portion of the first diagonal branch. This was not significantly changed from the 2012 study.  Left circumflex: Calcified vessel with widely patent previously placed 2 stents proximally, extending just beyond the ostium to just prior to the takeoff of the marginal branch. The marginal vessel was bifurcating and was free of disease. The AV groove circumflex was small caliber, and it 70% focal stenosis.  Right coronary artery: Large caliber dominant vessel that G. It was 56% smooth narrowing with suggestion of mild mid systolic bridging. The RCA gave rise to a large PDA vessel is otherwise free of significant disease.   Left ventriculography revealed moderate LV dysfunction with an ejection fraction of approximately 35%. There was slightly more pronounced inferior hypocontractility relative to the anterolateral wall.   Contrast: 85 cc Omnipaque  IMPRESSION:  Moderate LV dysfunction with global hypokinesis and slightly more pronounced inferior  hypocontractility and an ejection fraction of 35% in this patient status post remote left circumflex MI in 2011 treated with tandem stenting of the proximal circumflex vessel.  Moderate coronary obstructive disease with evidence for 50-60% smooth eccentric stenosis in the first diagonal branch of the LAD; widely patent proximal tandem stents in the circumflex vessel with a focal 70% stenosis in a small AV  groove circumflex; and 50-60% smooth stenosis in the proximal RCA with mild mid systolic bridging in the dominant RCA system.  RECOMMENDATION:  Increase medical therapy will be recommended in this 81 year old female.  ECG sinus rhythm, left ventricular hypertrophy with QRS widening, left axis deviation, nonspecific ST changes.    ASSESSMENT AND PLAN 1 acute on chronic combined systolic and diastolic congestive heart failure- the patient presents with symptoms concerning for congestive heart failure though she is not markedly volume overloaded on examination. She is taking Lasix 40 mg twice a day at home. We will treat with Lasix 60 mg IV twice a day and follow renal function. Repeat echocardiogram. Note her d-dimer is elevated. Given that she is not markedly volume overloaded and complaining of dyspnea will arrange VQ scan to screen for pulmonary embolus. 2 hypertension-blood pressure is elevated. Increase carvedilol to 6.25 mg twice a day. Increase Avapro to 150 mg daily. Follow blood pressure and adjust regimen as needed. 3 ischemic cardiomyopathy-continue ARB and beta blocker and advanced doses as described above. 4 coronary artery disease-continue aspirin and statin. 5 diabetes mellitus-Hold metformin and follow CBG. 6 hyperlipidemia-continue statin.   Signed, Kirk Ruths, MD

## 2015-01-30 NOTE — Progress Notes (Signed)
Admitted pt from ED oriented x 4 accompanied by husband and tech. VS taken and recorded. Tele put on. Oriented to room and call bell.no SOB nor chest pains noted.

## 2015-01-30 NOTE — ED Notes (Signed)
Pt here for SOB. sts always some SOB but worsening. Denies chest pain. Denies swelling.

## 2015-01-30 NOTE — ED Provider Notes (Signed)
CSN: [id number]     Arrival date & time 01/30/15  5449 History   First MD Initiated Contact with Patient 01/30/15 1054     Chief Complaint  Patient presents with  . Shortness of Breath     (Consider location/radiation/quality/duration/timing/severity/associated sxs/prior Treatment) Patient is a 81 y.o. female presenting with shortness of breath. The history is provided by the patient. No language interpreter was used.  Shortness of Breath Associated symptoms: cough   Associated symptoms: no abdominal pain, no chest pain, no fever, no headaches, no neck pain and no vomiting   Mary Cook is an 81 y/o F with PMHx of HTN, OA, diverticulitis, diabetes, coronary artery disease, history of myocardial infarction in September 2011 with stent placement, stroke, thrombosis with thrombectomy, hyperlipidemia, gastric bleed presenting to the ED with worsening shortness of breath. Patient reports that she's been feeling with shortness of breath for approximately 3-4 months, stated that last night the shortness of breath, progressively worse and into this morning. Patient reports that she was extremely uncomfortable was unable to lay down flat. Patient reported that she was not able to sleep secondary to eating uncomfortable and being short of breath. Patient reports that she is normally short of breath with exertion, reported that she is now short of breath even at rest. Stated that she's been having a dry cough without production. Reports that she has been feeling fatigued and generalized weakness all over. Stated that she does have a CPAP at home, but does not use it. Denied nasal congestion, fainting, fever, chills, chest pain, difficulty breathing, diaphoresis, leg swelling, hemoptysis, blurred vision, sudden loss of vision, neck pain, neck stiffness, changes to medications, numbness, tingling, loss of sensation, decreased appetite, decreased urination, melena, hematochezia, nausea, vomiting, diarrhea,  abdominal pain. PCP Dr. Alain [facility] Dr. Claiborne Billings  Past Medical History  Diagnosis Date  . Hypertension   . Osteoarthritis   . Osteopenia   . Diverticulosis of colon   . Diabetes mellitus     type II  . CAD (coronary artery disease)   . MI (myocardial infarction) 05/10/2010    Dr Claiborne Billings, Rx'd with CFX  DES  . Stroke 2002  . HYPERLIPIDEMIA 01/07/2008  . GIB (gastrointestinal bleeding) 2002    Diverticular  . Skin disorder   . Visual disorder   . Thrombus   . Sleep apnea     cpap therapy   Past Surgical History  Procedure Laterality Date  . Abdominal hysterectomy  1980    no bso  . Coronary angioplasty with stent placement  05/11/2010    stent CFX  . Cardiac catheterization  07/2011    LAD OK, D1 80%, CFX 30% w/ patent stent, RCA 50-60%, EF 40%  . Thrombectomy    . Cardiac catheterization    . Doppler echocardiography    . Myocardial perfusion study    . Left heart catheterization with coronary angiogram N/A 08/18/2011    Procedure: LEFT HEART CATHETERIZATION WITH CORONARY ANGIOGRAM;  Surgeon: Wellington Hampshire, MD;  Location: [facility] CATH [facility];  Service: Cardiovascular;  Laterality: N/A;  . Left heart catheterization with coronary angiogram N/A 06/08/2014    Procedure: LEFT HEART CATHETERIZATION WITH CORONARY ANGIOGRAM;  Surgeon: Troy Sine, MD;  Location: [facility] CATH LAB;  Service: Cardiovascular;  Laterality: N/A;  . Tonsillectomy    . Appendectomy     Family History  Problem Relation Age of Onset  . Hypertension Other   . CAD      father  History  Substance Use Topics  . Smoking status: Never Smoker   . Smokeless tobacco: Not on file  . Alcohol Use: No   OB History    No data available     Review of Systems  Constitutional: Positive for fatigue. Negative for fever and chills.  Eyes: Negative for visual disturbance.  Respiratory: Positive for cough and shortness of breath. Negative for chest tightness.   Cardiovascular: Negative for chest pain and leg  swelling.  Gastrointestinal: Negative for nausea, vomiting, abdominal pain and diarrhea.  Genitourinary: Negative for decreased urine volume.  Musculoskeletal: Negative for neck pain and neck stiffness.  Neurological: Negative for dizziness, weakness and headaches.      Allergies  Clopidogrel bisulfate; Lipitor; and Lisinopril  Home Medications   Prior to Admission medications   Medication Sig Start Date End Date Taking? Authorizing Provider  ACCU-CHEK AVIVA PLUS test strip  10/21/14  Yes Historical Provider, MD  ACCU-CHEK SOFTCLIX LANCETS lancets  10/21/14  Yes Historical Provider, MD  Alcohol Swabs (B-D SINGLE USE SWABS REGULAR) PADS  10/21/14  Yes Historical Provider, MD  aspirin 81 MG EC tablet Take 81 mg by mouth daily.     Yes Historical Provider, MD  atorvastatin (LIPITOR) 40 MG tablet Take 1 tablet (40 mg total) by mouth daily at 6 PM. 12/02/14  Yes Aleksei Plotnikov V, MD  carvedilol (COREG) 3.125 MG tablet Take 1 tablet (3.125 mg total) by mouth 2 (two) times daily. 09/05/14  Yes Troy Sine, MD  cholecalciferol (VITAMIN D) 1000 UNITS tablet Take 1 tablet (1,000 Units total) by mouth daily. 09/09/11 01/30/15 Yes Aleksei Plotnikov V, MD  citalopram (CELEXA) 20 MG tablet Take 1 tablet (20 mg total) by mouth daily. 12/02/14  Yes Aleksei Plotnikov V, MD  Cyanocobalamin (VITAMIN B-12) 500 MCG SUBL Place 2 tablets (1,000 mcg total) under the tongue 1 day or 1 dose. 12/02/14  Yes Aleksei Plotnikov V, MD  fluticasone (FLONASE) 50 MCG/ACT nasal spray Place 1 spray into both nostrils daily as needed for allergies or rhinitis.   Yes Historical Provider, MD  furosemide (LASIX) 40 MG tablet Take 1 tablet (40 mg total) by mouth daily. 12/02/14  Yes Aleksei Plotnikov V, MD  irbesartan (AVAPRO) 150 MG tablet Take 1/2 tablet daily 12/04/14  Yes Aleksei Plotnikov V, MD  isosorbide mononitrate (IMDUR) 60 MG 24 hr tablet Take 1 tablet (60 mg total) by mouth daily. 07/05/14  Yes Troy Sine, MD  metFORMIN  (GLUCOPHAGE) 500 MG tablet Take 1 tablet (500 mg total) by mouth 2 (two) times daily with a meal. 12/23/13 01/30/15 Yes Aleksei Plotnikov V, MD  omeprazole (PRILOSEC) 20 MG capsule Take 1 capsule (20 mg total) by mouth daily. 12/02/14  Yes Aleksei Plotnikov V, MD  potassium chloride SA (K-DUR,KLOR-CON) 20 MEQ tablet Take 1 tablet (20 mEq total) by mouth 2 (two) times daily. 12/23/13 01/30/15 Yes Aleksei Plotnikov V, MD   BP 154/86 mmHg  Pulse 74  Temp(Src) 98 F (36.7 C) (Oral)  Resp 18  Wt 178 lb (80.74 kg)  SpO2 95% Physical Exam  Constitutional: She is oriented to person, place, and time. She appears well-developed and well-nourished. No distress.  HENT:  Head: Normocephalic and atraumatic.  Mouth/Throat: Oropharynx is clear and moist. No oropharyngeal exudate.  Eyes: Conjunctivae and EOM are normal. Pupils are equal, round, and reactive to light. Right eye exhibits no discharge. Left eye exhibits no discharge.  Neck: Normal range of motion. Neck supple.  Cardiovascular: Normal rate, regular  rhythm and normal heart sounds.  Exam reveals no friction rub.   No murmur heard. Pulses:      Radial pulses are 2+ on the right side, and 2+ on the left side.       Dorsalis pedis pulses are 2+ on the right side, and 2+ on the left side.  Cap refill < 3 seconds Negative swelling or pitting edema noted to the lower extremities bilaterally   Pulmonary/Chest: Effort normal and breath sounds normal. No respiratory distress. She has no wheezes. She has no rales.  Patient is able to speak in full sentences without difficulty  Negative use of accessory muscles Negative stridor  Abdominal: Soft. Bowel sounds are normal. She exhibits no distension. There is no tenderness. There is no rebound and no guarding.  Musculoskeletal: Normal range of motion.  Neurological: She is alert and oriented to person, place, and time. No cranial nerve deficit. She exhibits normal muscle tone. Coordination normal. GCS eye  subscore is 4. GCS verbal subscore is 5. GCS motor subscore is 6.  Cranial nerves III-XII grossly intact Strength 5+/5+ to upper and lower extremities bilaterally with resistance applied, equal distribution noted Equal grip strength Negative facial droop Negative slurred speech Negative aphasia Patient follows commands well Patient responds to questions appropriately Negative arm drift  Skin: Skin is warm and dry. No rash noted. She is not diaphoretic. No erythema.  Psychiatric: She has a normal mood and affect. Her behavior is normal. Thought content normal.  Nursing note and vitals reviewed.   ED Course  Procedures (including critical care time)  Results for orders placed or performed during the hospital encounter of 01/30/15  CBC with Differential/Platelet  Result Value Ref Range   WBC 6.3 4.0 - 10.5 K/uL   RBC 3.91 3.87 - 5.11 MIL/uL   Hemoglobin 12.1 12.0 - 15.0 g/dL   HCT 35.3 (L) 36.0 - 46.0 %   MCV 90.3 78.0 - 100.0 fL   MCH 30.9 26.0 - 34.0 pg   MCHC 34.3 30.0 - 36.0 g/dL   RDW 12.6 11.5 - 15.5 %   Platelets 179 150 - 400 K/uL   Neutrophils Relative % 63 43 - 77 %   Neutro Abs 4.0 1.7 - 7.7 K/uL   Lymphocytes Relative 27 12 - 46 %   Lymphs Abs 1.7 0.7 - 4.0 K/uL   Monocytes Relative 8 3 - 12 %   Monocytes Absolute 0.5 0.1 - 1.0 K/uL   Eosinophils Relative 2 0 - 5 %   Eosinophils Absolute 0.1 0.0 - 0.7 K/uL   Basophils Relative 0 0 - 1 %   Basophils Absolute 0.0 0.0 - 0.1 K/uL  Comprehensive metabolic panel  Result Value Ref Range   Sodium 139 135 - 145 mmol/L   Potassium 3.7 3.5 - 5.1 mmol/L   Chloride 107 101 - 111 mmol/L   CO2 25 22 - 32 mmol/L   Glucose, Bld 183 (H) 65 - 99 mg/dL   BUN 13 6 - 20 mg/dL   Creatinine, Ser 1.11 (H) 0.44 - 1.00 mg/dL   Calcium 9.1 8.9 - 10.3 mg/dL   Total Protein 6.6 6.5 - 8.1 g/dL   Albumin 3.3 (L) 3.5 - 5.0 g/dL   AST 16 15 - 41 U/L   ALT 13 (L) 14 - 54 U/L   Alkaline Phosphatase 76 38 - 126 U/L   Total Bilirubin 0.6  0.3 - 1.2 mg/dL   GFR calc non Af Amer 44 (L) >60 mL/min  GFR calc Af Amer 51 (L) >60 mL/min   Anion gap 7 5 - 15  Troponin I  Result Value Ref Range   Troponin I 0.03 <0.031 ng/mL  Brain natriuretic peptide  Result Value Ref Range   B Natriuretic Peptide 508.3 (H) 0.0 - 100.0 pg/mL    Labs Review Labs Reviewed  CBC WITH DIFFERENTIAL/PLATELET - Abnormal; Notable for the following:    HCT 35.3 (*)    All other components within normal limits  COMPREHENSIVE METABOLIC PANEL - Abnormal; Notable for the following:    Glucose, Bld 183 (*)    Creatinine, Ser 1.11 (*)    Albumin 3.3 (*)    ALT 13 (*)    GFR calc non Af Amer 44 (*)    GFR calc Af Amer 51 (*)    All other components within normal limits  BRAIN NATRIURETIC PEPTIDE - Abnormal; Notable for the following:    B Natriuretic Peptide 508.3 (*)    All other components within normal limits  TROPONIN I  TROPONIN I    Imaging Review Dg Chest 2 View  01/30/2015   CLINICAL DATA:  Worsening shortness of breath.  EXAM: CHEST  2 VIEW  COMPARISON:  06/22/2014 and 06/06/2014  FINDINGS: The cardiac silhouette is upper limits of normal to mildly enlarged in size. Thoracic aortic calcification is noted. Mild prominence of the interstitial markings throughout both lungs is similar to at most minimally increased from 06/06/2014. No segmental airspace consolidation, overt pulmonary edema, pleural effusion, or pneumothorax is identified. Suture anchor is noted in the left humeral head.  IMPRESSION: Borderline cardiomegaly with stable to slightly increased interstitial prominence bilaterally, nonspecific however minimal interstitial edema or other interstitial lung disease is not excluded. No evidence of pneumonia or overt alveolar edema.   Electronically Signed   By: Logan Bores   On: 01/30/2015 10:58     EKG Interpretation None       2:14 PM Spoke with Mary Cook, Cardiology to see.   MDM   Final diagnoses:  Acute on chronic diastolic CHF  (congestive heart failure)  Dyspnea  History of MI (myocardial infarction)    Medications - No data to display  Filed Vitals:   01/30/15 1432 01/30/15 1433 01/30/15 1434 01/30/15 1500  BP: 189/90   154/86  Pulse:  75  74  Temp:      TempSrc:      Resp:  20  18  Weight:   178 lb (80.74 kg)   SpO2:  98%  95%   This provider reviewed the patient's chart. Patient had a large out-of-hospital anterior wall myocardial infarction in September 2011 followed by post MI pericarditis with thrombus formation requiring thrombectomy. Patient has stent placement and LCA. Nuclear study identified inferior apical ischemia. Patient had cardiac cath performed in 2015 the noted moderate left ventricular dysfunction with an ejection fraction of 35% with moderate multivessel coronary artery disease. Patient currently being followed by [facility], Dr. Claiborne Billings. Patient is supposed to be using her CPAP.  EKG normal sinus rhythm a heart rate of 70 bpm with left axis deviation and LVH. Troponin negative elevation. BNP mildly elevated at 508.3. CBC negative elevated leukocytosis. Hemoglobin 12.1, hematocrit 35.3. CMP mildly elevated creatinine of 1.11. Chest x-ray noted borderline cardiomegaly with stable to slightly increased interstitial prominence bilaterally with nonspecific however minimal interstitial edema or other interstitial lung disease is not excluded. No evidence of pneumonia or alveolar edema.  BNP noted to be mildly elevated, but negative findings  of fluid overload. Patient has been resting comfortably with a pulse ox of 98% on room air with negative findings of respiratory distress. Patient has been seen and assessed by cardiology, patient to be admitted under the care of cardiologist. Patient to be admitted for acute on chronic congestive heart failure with dyspnea. Patient to be admitted with IV Lasix and close monitoring of renal function. Patient understood and agreed to plan of care.  Jamse Mead,  PA-C 01/30/15 [place], MD 01/31/15 [phone]

## 2015-01-31 ENCOUNTER — Encounter (HOSPITAL_COMMUNITY): Payer: Self-pay | Admitting: General Practice

## 2015-01-31 ENCOUNTER — Inpatient Hospital Stay (HOSPITAL_COMMUNITY): Payer: Commercial Managed Care - HMO

## 2015-01-31 DIAGNOSIS — R0600 Dyspnea, unspecified: Secondary | ICD-10-CM

## 2015-01-31 LAB — BASIC METABOLIC PANEL
Anion gap: 10 (ref 5–15)
BUN: 13 mg/dL (ref 6–20)
CO2: 25 mmol/L (ref 22–32)
CREATININE: 1.04 mg/dL — AB (ref 0.44–1.00)
Calcium: 9 mg/dL (ref 8.9–10.3)
Chloride: 102 mmol/L (ref 101–111)
GFR calc non Af Amer: 47 mL/min — ABNORMAL LOW (ref >60)
GFR, EST AFRICAN AMERICAN: 55 mL/min — AB (ref >60)
Glucose, Bld: 234 mg/dL — ABNORMAL HIGH (ref 65–99)
Potassium: 3.7 mmol/L (ref 3.5–5.1)
Sodium: 137 mmol/L (ref 135–145)

## 2015-01-31 LAB — TROPONIN I
TROPONIN I: 0.04 ng/mL — AB (ref <0.031)
Troponin I: 0.04 ng/mL — ABNORMAL HIGH (ref <0.031)

## 2015-01-31 LAB — GLUCOSE, CAPILLARY
GLUCOSE-CAPILLARY: 251 mg/dL — AB (ref 65–99)
GLUCOSE-CAPILLARY: 204 mg/dL — AB (ref 65–99)
GLUCOSE-CAPILLARY: 148 mg/dL — AB (ref 65–99)
Glucose-Capillary: 175 mg/dL — ABNORMAL HIGH (ref 65–99)

## 2015-01-31 MED ORDER — ZOLPIDEM TARTRATE 5 MG PO TABS
5.0000 mg | ORAL_TABLET | Freq: Every evening | ORAL | Status: DC | PRN
  Administered 2015-01-31 (×2): 5 mg via ORAL
  Filled 2015-01-31 (×3): qty 1

## 2015-01-31 MED ORDER — ALUM & MAG HYDROXIDE-SIMETH 200-200-20 MG/5ML PO SUSP
30.0000 mL | ORAL | Status: DC | PRN
  Administered 2015-01-31: 30 mL via ORAL
  Filled 2015-01-31: qty 30

## 2015-01-31 MED ORDER — TECHNETIUM TC 99M DIETHYLENETRIAME-PENTAACETIC ACID: 40.0000 | Freq: Once | INTRAVENOUS | Status: AC | PRN

## 2015-01-31 NOTE — Progress Notes (Signed)
  Echocardiogram 2D Echocardiogram has been performed.  Diamond Nickel 01/31/2015, 12:34 PM

## 2015-01-31 NOTE — Progress Notes (Signed)
UR COMPLETED  

## 2015-01-31 NOTE — Progress Notes (Signed)
Patient Profile: Mary Cook is a 81 y.o. female with a history of CAD, HTN, OA, diverticulitis, OSA on CPAP, diabetes, stroke, thrombosis with thrombectomy, hyperlipidemia, gastric bleed who presents with a 1 month hx of dyspnea on exertion.   Subjective: Dyspnea somewhat improved today. She had no acute problems overnight.   Objective: Vital signs in last 24 hours: Temp:  [97.6 F (36.4 C)-98.4 F (36.9 C)] 97.6 F (36.4 C) (06/07 7867) Pulse Rate:  [60-84] 72 (06/07 1036) Resp:  [18-28] 20 (06/07 0608) BP: (124-224)/(45-182) 124/45 mmHg (06/07 1036) SpO2:  [91 %-99 %] 91 % (06/07 0608) Weight:  [79.969 kg (176 lb 4.8 oz)-81.251 kg (179 lb 2 oz)] 79.969 kg (176 lb 4.8 oz) (06/07 6720) Last BM Date: 01/28/15  Intake/Output from previous day: 06/06 0701 - 06/07 0700 In: 120 [P.O.:120] Out: 100 [Urine:100] Intake/Output this shift: Total I/O In: 240 [P.O.:240] Out: -   Medications Current Facility-Administered Medications  Medication Dose Route Frequency Provider Last Rate Last Dose  . 0.9 %  sodium chloride infusion  250 mL Intravenous PRN Bhavinkumar Bhagat, PA      . acetaminophen (TYLENOL) tablet 650 mg  650 mg Oral Q4H PRN Bhavinkumar Bhagat, PA      . aspirin EC tablet 81 mg  81 mg Oral Daily Bhavinkumar Bhagat, PA   81 mg at 01/31/15 1036  . atorvastatin (LIPITOR) tablet 40 mg  40 mg Oral q1800 Bhavinkumar Bhagat, PA   40 mg at 01/30/15 2159  . carvedilol (COREG) tablet 6.25 mg  6.25 mg Oral BID Bhavinkumar Bhagat, PA   6.25 mg at 01/31/15 1036  . cholecalciferol (VITAMIN D) tablet 1,000 Units  1,000 Units Oral Daily Bhavinkumar Bhagat, PA   1,000 Units at 01/31/15 1036  . citalopram (CELEXA) tablet 20 mg  20 mg Oral Daily Bhavinkumar Bhagat, PA   20 mg at 01/31/15 1036  . fluticasone (FLONASE) 50 MCG/ACT nasal spray 1 spray  1 spray Each Nare Daily PRN Bhavinkumar Bhagat, PA      . furosemide (LASIX) injection 60 mg  60 mg Intravenous BID Bhavinkumar  Bhagat, PA   60 mg at 01/31/15 1036  . heparin injection 5,000 Units  5,000 Units Subcutaneous 3 times per day Leanor Kail, PA   5,000 Units at 01/31/15 0607  . insulin aspart (novoLOG) injection 0-9 Units  0-9 Units Subcutaneous TID WC Bhavinkumar Bhagat, PA   5 Units at 01/31/15 9470  . irbesartan (AVAPRO) tablet 150 mg  150 mg Oral Daily Bhavinkumar Bhagat, PA   150 mg at 01/31/15 1036  . isosorbide mononitrate (IMDUR) 24 hr tablet 60 mg  60 mg Oral Daily Bhavinkumar Bhagat, PA   60 mg at 01/31/15 1036  . ondansetron (ZOFRAN) injection 4 mg  4 mg Intravenous Q6H PRN Bhavinkumar Bhagat, PA      . potassium chloride SA (K-DUR,KLOR-CON) CR tablet 20 mEq  20 mEq Oral BID Bhavinkumar Bhagat, PA   20 mEq at 01/31/15 1036  . sodium chloride 0.9 % injection 3 mL  3 mL Intravenous Q12H Bhavinkumar Bhagat, PA   3 mL at 01/31/15 1038  . sodium chloride 0.9 % injection 3 mL  3 mL Intravenous PRN Bhavinkumar Bhagat, PA      . zolpidem (AMBIEN) tablet 5 mg  5 mg Oral QHS PRN Jacolyn Reedy, MD   5 mg at 01/31/15 0225    PE: General appearance: alert, cooperative and appears stated age Neck: no adenopathy, no carotid bruit, no  JVD, supple, symmetrical, trachea midline and thyroid not enlarged, symmetric, no tenderness/mass/nodules Lungs: diminished breath sounds LLL and RLL Heart: regular rate and rhythm, S1, S2 normal, no murmur, click, rub or gallop Abdomen: soft, non-tender; bowel sounds normal; no masses,  no organomegaly Extremities: extremities normal, atraumatic, no cyanosis or edema Pulses: 2+ and symmetric Skin: Skin color, texture, turgor normal. No rashes or lesions Neurologic: Grossly normal  Lab Results:   Recent Labs  01/30/15 1207  WBC 6.3  HGB 12.1  HCT 35.3*  PLT 179   BMET  Recent Labs  01/30/15 1207 01/31/15 0211  NA 139 137  K 3.7 3.7  CL 107 102  CO2 25 25  GLUCOSE 183* 234*  BUN 13 13  CREATININE 1.11* 1.04*  CALCIUM 9.1 9.0   Cardiac Enzymes 01/30/15  @ 2150: 0.05 01/31/15 @ 0211: 0.04 01/31/15 @ 0950: 0.04  Studies/Results: 01/30/15 CXR: Borderline cardiomegaly with increased interstitial prominence.   Assessment/Plan Active Problems:   Hyperlipidemia   Essential hypertension   CAD (coronary artery disease), Ant MI, CFX DES 9/11, no ISR or progression at cath 2012   Acute on chronic combined systolic and diastolic CHF, NYHA class 3   -Weight is down 0.5 kg today. Her dry weight appears to be 75 kg per outpatient records (current weight 79.9 kg). Renal labs look good this morning; continue Lasix 60 mg IV BID for today. Transition to PO tomorrow if diuresing well, goal net negative 1L.   -BP adequately controlled.   -Continue atorvastatin for HLD  -CAD: continue ASA, atorvastatin, Coreg, and Imdur. Allergy to ACEI. Consider ARB.        LOS: 1 day   [name], [place] 01/31/2015 11:01 AM  Not clear to me how much CHF she is in.  Resting flat comfortably in bed and CXR not impressive  Continue iv lasix today and possible d/c in am  BP well controlled   Jenkins Rouge

## 2015-02-01 ENCOUNTER — Inpatient Hospital Stay (HOSPITAL_COMMUNITY): Payer: Commercial Managed Care - HMO

## 2015-02-01 ENCOUNTER — Encounter (HOSPITAL_COMMUNITY): Payer: Self-pay

## 2015-02-01 DIAGNOSIS — I5021 Acute systolic (congestive) heart failure: Secondary | ICD-10-CM

## 2015-02-01 LAB — GLUCOSE, CAPILLARY
GLUCOSE-CAPILLARY: 170 mg/dL — AB (ref 65–99)
Glucose-Capillary: 223 mg/dL — ABNORMAL HIGH (ref 65–99)
Glucose-Capillary: 163 mg/dL — ABNORMAL HIGH (ref 65–99)
Glucose-Capillary: 159 mg/dL — ABNORMAL HIGH (ref 65–99)
Glucose-Capillary: 212 mg/dL — ABNORMAL HIGH (ref 65–99)

## 2015-02-01 LAB — BASIC METABOLIC PANEL
Anion gap: 9 (ref 5–15)
BUN: 23 mg/dL — ABNORMAL HIGH (ref 6–20)
CHLORIDE: 99 mmol/L — AB (ref 101–111)
CO2: 27 mmol/L (ref 22–32)
Calcium: 8.8 mg/dL — ABNORMAL LOW (ref 8.9–10.3)
Creatinine, Ser: 1.3 mg/dL — ABNORMAL HIGH (ref 0.44–1.00)
GFR, EST AFRICAN AMERICAN: 42 mL/min — AB (ref >60)
GFR, EST NON AFRICAN AMERICAN: 36 mL/min — AB (ref >60)
Glucose, Bld: 182 mg/dL — ABNORMAL HIGH (ref 65–99)
POTASSIUM: 3.6 mmol/L (ref 3.5–5.1)
Sodium: 135 mmol/L (ref 135–145)

## 2015-02-01 LAB — MRSA PCR SCREENING: MRSA by PCR: NEGATIVE

## 2015-02-01 MED ORDER — NALOXONE HCL 0.4 MG/ML IJ SOLN
INTRAMUSCULAR | Status: AC
  Filled 2015-02-01: qty 1

## 2015-02-01 MED ORDER — ATROPINE SULFATE 0.1 MG/ML IJ SOLN
INTRAMUSCULAR | Status: AC
Start: 2015-02-01 — End: 2015-02-02
  Filled 2015-02-01: qty 10

## 2015-02-01 MED ORDER — DOPAMINE-DEXTROSE 3.2-5 MG/ML-% IV SOLN
0.0000 ug/kg/min | INTRAVENOUS | Status: DC
  Administered 2015-02-01: 10 ug/kg/min via INTRAVENOUS
  Administered 2015-02-01: 5 ug/kg/min via INTRAVENOUS

## 2015-02-01 MED ORDER — HYDROMORPHONE HCL 1 MG/ML IJ SOLN
0.5000 mg | INTRAMUSCULAR | Status: DC | PRN
  Administered 2015-02-01 (×3): 0.5 mg via INTRAVENOUS
  Filled 2015-02-01 (×2): qty 1

## 2015-02-01 MED ORDER — NALOXONE HCL 0.4 MG/ML IJ SOLN
0.4000 mg | INTRAMUSCULAR | Status: DC | PRN
  Administered 2015-02-01: 0.4 mg via INTRAVENOUS

## 2015-02-01 MED ORDER — MORPHINE SULFATE 2 MG/ML IJ SOLN
2.0000 mg | Freq: Once | INTRAMUSCULAR | Status: AC
  Administered 2015-02-01: 2 mg via INTRAVENOUS
  Filled 2015-02-01: qty 1

## 2015-02-01 MED FILL — Medication: Qty: 1 | Status: AC

## 2015-02-01 NOTE — Progress Notes (Signed)
Subjective:  Upper back pain after fall this am while getting weighed.  Objective:  Vital Signs in the last 24 hours: Temp:  [97.8 F (36.6 C)-98.5 F (36.9 C)] 97.8 F (36.6 C) (06/08 0724) Pulse Rate:  [60-72] 60 (06/08 0724) Resp:  [16-20] 18 (06/08 0724) BP: (113-158)/(45-85) 140/62 mmHg (06/08 0724) SpO2:  [90 %-98 %] 90 % (06/08 0724) Weight:  [176 lb 9.6 oz (80.105 kg)] 176 lb 9.6 oz (80.105 kg) (06/08 0509)  Intake/Output from previous day:  Intake/Output Summary (Last 24 hours) at 02/01/15 0956 Last data filed at 02/01/15 0854  Gross per 24 hour  Intake   1320 ml  Output    950 ml  Net    370 ml    Physical Exam: General appearance: alert, cooperative, no distress and morbidly obese Lungs: clear to auscultation bilaterally and tender along Rt T-spine to palpation Heart: regular rate and rhythm Neurologic: Grossly normal   Rate: 62  Rhythm: normal sinus rhythm  Lab Results:  Recent Labs  01/30/15 1207  WBC 6.3  HGB 12.1  PLT 179    Recent Labs  01/31/15 0211 02/01/15 0340  NA 137 135  K 3.7 3.6  CL 102 99*  CO2 25 27  GLUCOSE 234* 182*  BUN 13 23*  CREATININE 1.04* 1.30*    Recent Labs  01/31/15 0211 01/31/15 0950  TROPONINI 0.04* 0.04*   No results for input(s): INR in the last 72 hours.  Scheduled Meds: . aspirin  81 mg Oral Daily  . atorvastatin  40 mg Oral q1800  . carvedilol  6.25 mg Oral BID  . cholecalciferol  1,000 Units Oral Daily  . citalopram  20 mg Oral Daily  . furosemide  60 mg Intravenous BID  . heparin  5,000 Units Subcutaneous 3 times per day  . insulin aspart  0-9 Units Subcutaneous TID WC  . irbesartan  150 mg Oral Daily  . isosorbide mononitrate  60 mg Oral Daily  . potassium chloride SA  20 mEq Oral BID  . sodium chloride  3 mL Intravenous Q12H   Continuous Infusions:  PRN Meds:.sodium chloride, acetaminophen, alum & mag hydroxide-simeth, fluticasone, ondansetron (ZOFRAN) IV, sodium chloride,  zolpidem   Imaging: Imaging results have been reviewed  Cardiac Studies: Echo 01/31/15 Study Conclusions  - Left ventricle: The cavity size was normal. Wall thickness was increased in a pattern of severe LVH. Systolic function was moderately to severely reduced. The estimated ejection fraction was in the range of 30% to 35%. There is severe hypokinesis of the entire inferolateral myocardium. Doppler parameters are consistent with abnormal left ventricular relaxation (grade 1 diastolic dysfunction). Doppler parameters are consistent with high ventricular filling pressure. - Aortic valve: There was mild regurgitation. - Mitral valve: Calcified annulus. Mildly thickened leaflets .   Assessment/Plan:  Mary Cook is a 81 y.o. female with a history of CAD, HTN, OA, diverticulitis, OSA on CPAP, diabetes, stroke, thrombosis with thrombectomy, hyperlipidemia, gastric bleed who presents with a 1 month hx of dyspnea on exertion. She was admitted with acute on chronic combined CHF. Her I/O is not negative and her wgt hasn't changed much but she is symptomatically improved.    Principal Problem:   Acute systolic congestive heart failure, probably mixed systolic/diastolic, EF 96% at cath Active Problems:   CAD (coronary artery disease), Ant MI, CFX DES 9/11, no ISR or progression at cath 2012   Thoracic back pain after fall    Diabetes mellitus  Hyperlipidemia   Essential hypertension   Sleep apnea, cpap had been stopped secondary to "sinus issues"   OSA on CPAP   Acute on chronic combined systolic and diastolic CHF, NYHA class 3   PLAN: Pt fell this am. Rib XR negative. Will Rx for pain and get T-spine films.   [name] PA-C 02/01/2015, 9:56 AM [phone]  Patient examined chart reviewed doubt any fractures.  Ribs ok  Will have to keep another day and  Observe  CHF seems improved.    Jenkins Rouge

## 2015-02-01 NOTE — Progress Notes (Signed)
Patient heart rate sustaining in the 30s. Patient appears pale and is arousable. Patient is able to state who her nurse is, but is unable to state where she is and who her husband is. Patient had just received pain medication about 15 minitues prior to this occuring. CBG is 163. Temp-98.3; HR-36; Resp-20; BP-72/35; O2-91%. PA notified and made aware and Rapid response notified to come and further assess patient.

## 2015-02-01 NOTE — Progress Notes (Signed)
eLink Physician-Brief Progress Note Patient Name: Mary Cook DOB: 23-Jun-1926 MRN: [id number]   Date of Service  02/01/2015  HPI/Events of Note  81 yo female admitted for 1 month history of increased SOB. Given Dilaudid this AM with subsequent decreased LOC, hypotension and bradycardia. Now improved with Narcan, Dopamine IV infusion. Management per Cardiology. Currently BP = 131/44 and HR = 59.  eICU Interventions  Continue present management.      Intervention Category Evaluation Type: New Patient Evaluation  Lysle Dingwall 02/01/2015, 4:00 PM

## 2015-02-01 NOTE — Progress Notes (Signed)
Report given to receiving RN.

## 2015-02-01 NOTE — Significant Event (Signed)
Rapid Response Event Note  Overview: Time Called: [place] Time: 1425 Event Type: Cardiac  Initial Focused Assessment: Patient with Bradycardia.  Per Staff patient had decreased LOC On arrival patient With HR 40s,  72/36 Awake and talking to staff, she states that she doesn't feel well and feels like she is smothering.  Husband at bedside.  Interventions: Narcan given:  Initially HR came up to 50 and BP 100/55  Then HR began to slow again into 40s and BP 72/45.  Patient answering questions and improved LOC but bradycardic. Dopamine started at 70mcg/kg (66ml/hr) HR improved into the 50s. Patient began vomiting gastric contents. Transported to 2H02 via bed with O2 and heart monitor. 12 lead done on arrival BP 162/64 HR 71 Dopamine weaned to 41mcg.  Event Summary: Name of Physician Notified: [name] PA at bedside at      at    Outcome: Transferred ([place]) [phone])  Event End Time: 1508  Raliegh Ip

## 2015-02-01 NOTE — Progress Notes (Addendum)
   02/01/15 0529  What Happened  Was fall witnessed? Yes  Who witnessed fall? Osvaldo Shipper, NT  Patients activity before fall ambulating-assisted (stepping off scale)  Point of contact back (back hit [facility])  Was patient injured? Unsure (red mark to right back)  Follow Up  MD notified Dr. Marlowe Sax  Time MD notified [phone]  Family notified Yes-comment  Time family notified 15 (family in room during fall)  Additional tests Yes-comment (Xray)  Simple treatment Other (comment) (tylenol for pain)  Adult Fall Risk Assessment  Risk Factor Category (scoring not indicated) Fall has occurred during this admission (document High fall risk)  Patient's Fall Risk High Fall Risk (>13 points)  Adult Fall Risk Interventions  Required Bundle Interventions *See Row Information* High fall risk - low, moderate, and high requirements implemented  Additional Interventions Fall risk signage;Individualized elimination schedule;Use of appropriate toileting equipment (bedpan, BSC, etc.)    02/01/15 0554  Pain Assessment  Pain Assessment 0-10  Pain Score 9  Pain Type Acute pain  Pain Location Back  Pain Orientation Upper;Right  Pain Descriptors / Indicators Discomfort  Pain Intervention(s) Medication (See eMAR)   Per NT, pt stepped off scale, lost balance and hit right upper back on BSC. Per pt and NT, pt did not hit head. VS done on pt ,VSS, pt able to move all extremities, Neuro assessment WDL and head to toe assessment completed, pt c/o pain in back, no other pain. Pt assisted back to bed. No other signs of injury noted. Pt given tylenol for pain. MD on call, Dr. Marlowe Sax notified and new order to give 2mg  morphine IV and order Xray. Per MD, ok to give scheduled subQ heparin this AM. Pt NSR 60-70s.  Pt with yellow arm band in place, yellow socks, and bed alarm on. Will continue to monitor. Ronnette Hila, RN

## 2015-02-01 NOTE — Progress Notes (Signed)
Called to see pt. She became less responsive, hypotensive and bradycardic after second dose of Dilaudid 0.5 mg. Pt was treated with one amp Narcan, IVF and ultimately IV Dopamine and transferred to CCU. EKG shows no acute changes. Throughout she has complained of back pain from her fall. She is currently stable on IV Dopamine. Dr Tamala Julian at bedside.   [name] PA-C 02/01/2015 3:03 PM

## 2015-02-02 ENCOUNTER — Inpatient Hospital Stay (HOSPITAL_COMMUNITY): Payer: Commercial Managed Care - HMO

## 2015-02-02 LAB — BASIC METABOLIC PANEL
ANION GAP: 8 (ref 5–15)
Anion gap: 8 (ref 5–15)
BUN: 29 mg/dL — ABNORMAL HIGH (ref 6–20)
BUN: 30 mg/dL — AB (ref 6–20)
CO2: 29 mmol/L (ref 22–32)
CO2: 28 mmol/L (ref 22–32)
Calcium: 9.1 mg/dL (ref 8.9–10.3)
Calcium: 9 mg/dL (ref 8.9–10.3)
Chloride: 100 mmol/L — ABNORMAL LOW (ref 101–111)
Chloride: 101 mmol/L (ref 101–111)
Creatinine, Ser: 1.57 mg/dL — ABNORMAL HIGH (ref 0.44–1.00)
Creatinine, Ser: 1.67 mg/dL — ABNORMAL HIGH (ref 0.44–1.00)
GFR calc Af Amer: 33 mL/min — ABNORMAL LOW (ref >60)
GFR calc non Af Amer: 29 mL/min — ABNORMAL LOW (ref >60)
GFR calc non Af Amer: 27 mL/min — ABNORMAL LOW (ref >60)
GFR, EST AFRICAN AMERICAN: 31 mL/min — AB (ref >60)
Glucose, Bld: 161 mg/dL — ABNORMAL HIGH (ref 65–99)
Glucose, Bld: 195 mg/dL — ABNORMAL HIGH (ref 65–99)
POTASSIUM: 4.7 mmol/L (ref 3.5–5.1)
Potassium: 4.1 mmol/L (ref 3.5–5.1)
Sodium: 137 mmol/L (ref 135–145)
Sodium: 137 mmol/L (ref 135–145)

## 2015-02-02 LAB — POCT I-STAT 3, ART BLOOD GAS (G3+)
Acid-Base Excess: 3 mmol/L — ABNORMAL HIGH (ref 0.0–2.0)
Bicarbonate: 29 mEq/L — ABNORMAL HIGH (ref 20.0–24.0)
O2 SAT: 96 %
PH ART: 7.389 (ref 7.350–7.450)
TCO2: 30 mmol/L (ref 0–100)
pCO2 arterial: 47.8 mmHg — ABNORMAL HIGH (ref 35.0–45.0)
pO2, Arterial: 83 mmHg (ref 80.0–100.0)

## 2015-02-02 LAB — GLUCOSE, CAPILLARY
GLUCOSE-CAPILLARY: 137 mg/dL — AB (ref 65–99)
Glucose-Capillary: 171 mg/dL — ABNORMAL HIGH (ref 65–99)
Glucose-Capillary: 230 mg/dL — ABNORMAL HIGH (ref 65–99)
Glucose-Capillary: 162 mg/dL — ABNORMAL HIGH (ref 65–99)

## 2015-02-02 MED ORDER — HYDROCODONE-ACETAMINOPHEN 5-325 MG PO TABS
1.0000 | ORAL_TABLET | Freq: Four times a day (QID) | ORAL | Status: AC | PRN
  Administered 2015-02-02 – 2015-02-03 (×3): 1 via ORAL
  Filled 2015-02-02 (×3): qty 1

## 2015-02-02 NOTE — Progress Notes (Signed)
Patient ID: Mary Cook, female   DOB: 1926-05-06, 81 y.o.   MRN: [id number]    Subjective:  Events from yesterday noted. Overmedicated.  This am much improved.  Back pain better No dyspnea Normal mental status  Objective:  Vital Signs in the last 24 hours: Temp:  [97.5 F (36.4 C)-98.3 F (36.8 C)] 98.3 F (36.8 C) (06/09 0000) Pulse Rate:  [36-72] 55 (06/09 0600) Resp:  [13-26] 18 (06/09 0000) BP: (72-174)/(21-124) 143/38 mmHg (06/09 0600) SpO2:  [88 %-100 %] 98 % (06/09 0600) Weight:  [80.5 kg (177 lb 7.5 oz)] 80.5 kg (177 lb 7.5 oz) (06/09 0600)  Intake/Output from previous day:  Intake/Output Summary (Last 24 hours) at 02/02/15 0820 Last data filed at 02/02/15 0600  Gross per 24 hour  Intake 1143.28 ml  Output   1825 ml  Net -681.72 ml    Physical Exam: General appearance: alert, cooperative, no distress and morbidly obese Lungs: clear to auscultation bilaterally and tender along Rt T-spine to palpation Heart: regular rate and rhythm Neurologic: Grossly normal   Rate: 62  Rhythm: normal sinus rhythm  Lab Results:  Recent Labs  01/30/15 1207  WBC 6.3  HGB 12.1  PLT 179    Recent Labs  02/01/15 0340 02/02/15 0243  NA 135 137  K 3.6 4.1  CL 99* 100*  CO2 27 29  GLUCOSE 182* 161*  BUN 23* 29*  CREATININE 1.30* 1.57*    Recent Labs  01/31/15 0211 01/31/15 0950  TROPONINI 0.04* 0.04*   No results for input(s): INR in the last 72 hours.  Scheduled Meds: . aspirin  81 mg Oral Daily  . atorvastatin  40 mg Oral q1800  . carvedilol  6.25 mg Oral BID  . cholecalciferol  1,000 Units Oral Daily  . citalopram  20 mg Oral Daily  . furosemide  60 mg Intravenous BID  . heparin  5,000 Units Subcutaneous 3 times per day  . insulin aspart  0-9 Units Subcutaneous TID WC  . irbesartan  150 mg Oral Daily  . isosorbide mononitrate  60 mg Oral Daily  . potassium chloride SA  20 mEq Oral BID  . sodium chloride  3 mL Intravenous Q12H   Continuous  Infusions: . DOPamine Stopped (02/02/15 0410)   PRN Meds:.sodium chloride, acetaminophen, alum & mag hydroxide-simeth, fluticasone, HYDROmorphone (DILAUDID) injection, naLOXone (NARCAN)  injection, ondansetron (ZOFRAN) IV, sodium chloride, zolpidem   Imaging: Imaging results have been reviewed  Cardiac Studies: Echo 01/31/15 Study Conclusions  - Left ventricle: The cavity size was normal. Wall thickness was increased in a pattern of severe LVH. Systolic function was moderately to severely reduced. The estimated ejection fraction was in the range of 30% to 35%. There is severe hypokinesis of the entire inferolateral myocardium. Doppler parameters are consistent with abnormal left ventricular relaxation (grade 1 diastolic dysfunction). Doppler parameters are consistent with high ventricular filling pressure. - Aortic valve: There was mild regurgitation. - Mitral valve: Calcified annulus. Mildly thickened leaflets .   Assessment/Plan:  Mary Cook is a 81 y.o. female with a history of CAD, HTN, OA, diverticulitis, OSA on CPAP, diabetes, stroke, thrombosis with thrombectomy, hyperlipidemia, gastric bleed who presents with a 1 month hx of dyspnea on exertion. She was admitted with acute on chronic combined CHF. Her I/O is not negative and her wgt hasn't changed much but she is symptomatically improved.    Principal Problem:   Acute systolic congestive heart failure, probably mixed systolic/diastolic, EF 04% at cath  Active Problems:   Diabetes mellitus   Hyperlipidemia   Essential hypertension   CAD (coronary artery disease), Ant MI, CFX DES 9/11, no ISR or progression at cath 2012   Sleep apnea, cpap had been stopped secondary to "sinus issues"   Thoracic back pain after fall    OSA on CPAP   Acute on chronic combined systolic and diastolic CHF, NYHA class 3   Fall   PLAN: MS improved  Got too many narcotic pain meds yesterday.  Ambulate spine and rib films  negative.  CHF compensated  Check CXR , BNP and ABG ( see what Baseline C02 is given issues yesterday.) Transfer to floor possible d/c in am  Jenkins Rouge

## 2015-02-02 NOTE — Progress Notes (Signed)
Inpatient Diabetes Program Recommendations  AACE/ADA: New Consensus Statement on Inpatient Glycemic Control (2013)  Target Ranges:  Prepandial:   less than 140 mg/dL      Peak postprandial:   less than 180 mg/dL (1-2 hours)      Critically ill patients:  140 - 180 mg/dL  Results for Mary Cook, Mary Cook (MRN [id number]) as of 02/02/2015 07:20  Ref. Range 02/02/2015 02:43  Glucose Latest Ref Range: 65-99 mg/dL 161 (H)   Results for LARITA, DEREMER (MRN [id number]) as of 02/02/2015 07:20  Ref. Range 02/01/2015 05:29 02/01/2015 10:51 02/01/2015 14:30 02/01/2015 16:12 02/01/2015 21:08  Glucose-Capillary Latest Ref Range: 65-99 mg/dL 170 (H) 223 (H) 163 (H) 159 (H) 212 (H)    Diabetes history: DM2 Outpatient Diabetes medications: Metformin 500 mg BID Current orders for Inpatient glycemic control: Novolog 0-9 units TID with meals  Inpatient Diabetes Program Recommendations Correction (SSI): Please consider ordering Novolog bedtime correction scale. HgbA1C: Please consider adding an A1C to blood in lab to evaluate glycemic control over the past 2-3 months.  Thanks, Barnie Alderman, RN, MSN, CCRN, CDE Diabetes Coordinator Inpatient Diabetes Program [phone] (Team Pager from [place] to [place]) [phone] (AP office) [phone] [facility] office) [phone] [facility] office)

## 2015-02-02 NOTE — Progress Notes (Signed)
Pt transfer report from [place] received at 21 and pt arrived to the unit via wheelchair with family and belongings to the side at 1425. Pt A&O x4; pt oriented to the room and unit; VSS; telemetry applied and verified; pt skin intact with no pressure ulcer noted; pt in bed comfortably with call light within reach. Will closely monitor. Francis Gaines Jenevie Casstevens RN.

## 2015-02-03 DIAGNOSIS — N189 Chronic kidney disease, unspecified: Secondary | ICD-10-CM | POA: Diagnosis present

## 2015-02-03 LAB — GLUCOSE, CAPILLARY
GLUCOSE-CAPILLARY: 152 mg/dL — AB (ref 65–99)
Glucose-Capillary: 154 mg/dL — ABNORMAL HIGH (ref 65–99)
Glucose-Capillary: 187 mg/dL — ABNORMAL HIGH (ref 65–99)
Glucose-Capillary: 295 mg/dL — ABNORMAL HIGH (ref 65–99)
Glucose-Capillary: 339 mg/dL — ABNORMAL HIGH (ref 65–99)
Glucose-Capillary: 456 mg/dL — ABNORMAL HIGH (ref 65–99)

## 2015-02-03 MED ORDER — CARVEDILOL 3.125 MG PO TABS
3.1250 mg | ORAL_TABLET | Freq: Two times a day (BID) | ORAL | Status: DC
  Administered 2015-02-04 – 2015-02-05 (×4): 3.125 mg via ORAL
  Filled 2015-02-03 (×5): qty 1

## 2015-02-03 MED ORDER — DOCUSATE SODIUM 100 MG PO CAPS
100.0000 mg | ORAL_CAPSULE | Freq: Two times a day (BID) | ORAL | Status: DC | PRN
  Administered 2015-02-03 – 2015-02-04 (×2): 100 mg via ORAL
  Filled 2015-02-03 (×2): qty 1

## 2015-02-03 NOTE — Progress Notes (Signed)
Patient ID: Mary Cook, female   DOB: 11-07-1925, 81 y.o.   MRN: [id number] See previous notes and PA note from today CHF improved.  Cr elevated hold lasix today Main issue is pain control from fall and residual back pain Not ready for d/c yet. Xrays back and ribs negative Hopefully home over weekend  [employer]

## 2015-02-03 NOTE — Progress Notes (Signed)
Primary Cardiologist: Dr. Claiborne Billings  Patient Profile: Mary Cook is a 81 y.o. female with a history of CAD, HTN, OA, diverticulitis, OSA on CPAP, diabetes, stroke, thrombosis with thrombectomy, hyperlipidemia, gastric bleed who presented with dyspnea. She had a fall on 6/8 in the hospital.   Subjective: Right flank/rib pain is severe today. Minimal relief from IV pain meds. No chest pain or dyspnea. She is able to lay flat without dyspnea.   Objective: Vital signs in last 24 hours: Temp:  [97.8 F (36.6 C)-98.2 F (36.8 C)] 98.2 F (36.8 C) (06/10 0810) Pulse Rate:  [60-71] 60 (06/10 0810) Resp:  [17-18] 18 (06/10 0810) BP: (95-146)/(30-57) 146/50 mmHg (06/10 0810) SpO2:  [91 %-97 %] 97 % (06/10 0810) Weight:  [79.47 kg (175 lb 3.2 oz)] 79.47 kg (175 lb 3.2 oz) (06/10 0537) Last BM Date: 01/30/15  Intake/Output from previous day: 06/09 0701 - 06/10 0700 In: 240 [P.O.:240] Out: 775 [Urine:775]   Medications Current Facility-Administered Medications  Medication Dose Route Frequency Provider Last Rate Last Dose  . 0.9 %  sodium chloride infusion  250 mL Intravenous PRN Bhavinkumar Bhagat, PA   Stopped at 02/02/15 0800  . acetaminophen (TYLENOL) tablet 650 mg  650 mg Oral Q4H PRN Bhavinkumar Bhagat, PA   650 mg at 02/02/15 1623  . alum & mag hydroxide-simeth (MAALOX/MYLANTA) 200-200-20 MG/5ML suspension 30 mL  30 mL Oral Q2H PRN Lelon Perla, MD   30 mL at 01/31/15 2056  . aspirin EC tablet 81 mg  81 mg Oral Daily Bhavinkumar Bhagat, PA   81 mg at 02/02/15 0854  . atorvastatin (LIPITOR) tablet 40 mg  40 mg Oral q1800 Bhavinkumar Bhagat, PA   40 mg at 02/02/15 1821  . carvedilol (COREG) tablet 6.25 mg  6.25 mg Oral BID Bhavinkumar Bhagat, PA   6.25 mg at 02/02/15 2120  . cholecalciferol (VITAMIN D) tablet 1,000 Units  1,000 Units Oral Daily Bhavinkumar Bhagat, PA   1,000 Units at 02/02/15 0853  . citalopram (CELEXA) tablet 20 mg  20 mg Oral Daily Bhavinkumar Bhagat, PA    20 mg at 02/02/15 0853  . DOPamine (INTROPIN) 800 mg in dextrose 5 % 250 mL (3.2 mg/mL) infusion  0-20 mcg/kg/min Intravenous Titrated Erlene Quan, PA-C   Stopped at 02/02/15 0410  . fluticasone (FLONASE) 50 MCG/ACT nasal spray 1 spray  1 spray Each Nare Daily PRN Bhavinkumar Bhagat, PA      . furosemide (LASIX) injection 60 mg  60 mg Intravenous BID Bhavinkumar Bhagat, PA   60 mg at 02/03/15 0811  . heparin injection 5,000 Units  5,000 Units Subcutaneous 3 times per day Leanor Kail, PA   5,000 Units at 02/03/15 1610  . HYDROcodone-acetaminophen (NORCO/VICODIN) 5-325 MG per tablet 1 tablet  1 tablet Oral Q6H PRN Wandra Mannan, MD   1 tablet at 02/03/15 0412  . HYDROmorphone (DILAUDID) injection 0.5 mg  0.5 mg Intravenous Q4H PRN Erlene Quan, PA-C   0.5 mg at 02/01/15 1400  . insulin aspart (novoLOG) injection 0-9 Units  0-9 Units Subcutaneous TID WC Bhavinkumar Bhagat, PA   2 Units at 02/03/15 9604  . irbesartan (AVAPRO) tablet 150 mg  150 mg Oral Daily Bhavinkumar Bhagat, PA   150 mg at 02/02/15 0853  . isosorbide mononitrate (IMDUR) 24 hr tablet 60 mg  60 mg Oral Daily Bhavinkumar Bhagat, PA   60 mg at 02/02/15 0853  . naloxone [facility]) injection 0.4 mg  0.4 mg Intravenous PRN [name]  K Kilroy, PA-C   0.4 mg at 02/01/15 1428  . ondansetron (ZOFRAN) injection 4 mg  4 mg Intravenous Q6H PRN Bhavinkumar Bhagat, PA   4 mg at 02/01/15 1506  . potassium chloride SA (K-DUR,KLOR-CON) CR tablet 20 mEq  20 mEq Oral BID Bhavinkumar Bhagat, PA   20 mEq at 02/02/15 2120  . sodium chloride 0.9 % injection 3 mL  3 mL Intravenous Q12H Bhavinkumar Bhagat, PA   3 mL at 02/02/15 2120  . sodium chloride 0.9 % injection 3 mL  3 mL Intravenous PRN Bhavinkumar Bhagat, PA      . zolpidem (AMBIEN) tablet 5 mg  5 mg Oral QHS PRN Jacolyn Reedy, MD   5 mg at 01/31/15 2246    PE: General appearance: alert, cooperative and mild distress Neck: JVD - 5 cm above sternal notch, no adenopathy, no carotid bruit, supple,  symmetrical, trachea midline and thyroid not enlarged, symmetric, no tenderness/mass/nodules Lungs: diminished breath sounds LLL and RLL Heart: irregularly irregular rhythm and S1, S2 normal Abdomen: abnormal findings:  marked tenderness in the right flank Extremities: edema 1+ pretibial edema bilaterally Pulses: 2+ and symmetric Skin: Skin color, texture, turgor normal. No rashes or lesions Neurologic: Grossly normal  Lab Results:   BMET  Recent Labs  02/01/15 0340 02/02/15 0243 02/02/15 1053  NA 135 137 137  K 3.6 4.1 4.7  CL 99* 100* 101  CO2 27 29 28   GLUCOSE 182* 161* 195*  BUN 23* 29* 30*  CREATININE 1.30* 1.57* 1.67*  CALCIUM 8.8* 9.1 9.0   Studies/Results: 02/02/15 CXR: Lungs clear. Stable cardiomegaly. No acute abnormality.  02/01/15 XR ribs, Right: No acute findings of fractures.   Assessment/Plan Principal Problem:   Acute on chronic combined systolic and diastolic heart failure, EF 30-35% Active Problems:   Hyperlipidemia   Diabetes mellitus   Essential hypertension   CAD (coronary artery disease), Ant MI, CFX DES 9/11, no ISR or progression at cath 2012   Sleep apnea, cpap had been stopped secondary to "sinus issues"   Thoracic back pain after fall    OSA on CPAP   Acute on chronic combined systolic and diastolic CHF, NYHA class 3   Fall   1. Acute on chronic combined systolic and diastolic CHF: I&O inaccurate. Her weight is down 1 Kg but only the weight from today was a standing-scale weight. Clinically she appears slightly hypervolemic but she is not symptomatic. EF 30-35%.  -Continue Coreg -Unable to tolerate ACEI/ARB/ARNI/Spiro -hold IV lasix with worsening Cr, start PO lasix tomorrow (either 60-80mg  BID)  2. CAD: stable. Continue ASA, Coreg, Imdur, and atorvastatin.   3. Thoracic back pain after fall: inadequate pain control.  -Norco PO q4 hours PRN -Acetaminophen 975 mg PO q8 hours scheduled. First dose now.  -consider Lidocaine patch to right  flank q 12 hours.  -Unable to tolerate NSAIDs. Avoid overmedicate  4. HLD: continue atorvastatin.   5. Acute on renal insufficiency  Dispo: possibly DC tomorrow. We need to adequately control her pain and ensure that she will diurese on PO Lasix.    LOS: 4 days   Almyra Deforest, PA-C 02/03/2015 9:34 AM

## 2015-02-03 NOTE — Progress Notes (Signed)
RN received a call from [place] concerning pt HR dropping to the 30's and also follow up call from pt husband stating pt said "she feel like she's about to pass out". RN went to room found pt sitting on commode, trying to cough and not quiet responding to command for about 10secs and she started responding; small emesis and stated "I was trying to have a BM"; 2L oxygen applied; VS as above; pt assisted back to bed and sleeping comfortably with spouse remaining at bedside. PA Hao notified and in to see pt. Francis Gaines Vinny Taranto RN.

## 2015-02-03 NOTE — Clinical Documentation Improvement (Signed)
Renal labs as below.  Please identify any clinical conditions associated with the abnormal labs, if any, and document in your progress note and carry over to the discharge summary.    Component      BUN Creatinine  Latest Ref Rng      6 - 20 mg/dL 0.44 - 1.00 mg/dL  01/30/2015     12:07 PM 13 1.11 (H)  01/31/2015     2:11 AM 13 1.04 (H)  02/01/2015     3:40 AM 23 (H) 1.30 (H)  02/02/2015     2:43 AM 29 (H) 1.57 (H)  02/02/2015     10:53 AM 30 (H) 1.67 (H)   Component      EGFR (Non-African Amer.)  Latest Ref Rng      >60 mL/min  01/30/2015     12:07 PM 44 (L)  01/31/2015     2:11 AM 47 (L)  02/01/2015     3:40 AM 36 (L)  02/02/2015     2:43 AM 29 (L)  02/02/2015     10:53 AM 27 (L)   Possible Clinical Conditions: -Acute kidney injury / acute renal failure -Acute on chronic renal failure (if present, please identify stage of ckd) -Chronic renal failure (if present, please identify stage of ckd) -Other condition -Unable to determine at present  Thank you, Mateo Flow, RN [phone] Clinical Documentation Specialist

## 2015-02-03 NOTE — Progress Notes (Signed)
Paged by nurse as patient had symptomatic bradycardia during micturation. She was also very symptomatic with presyncope. Telemetry shows HR in 20s which is lower than expected even for purely ventricular beats. Possibly sinus arrest. Symptom currently resolved, HR 50s, BP stable. Likely micturation vasovagal.  Will decrease coreg dose to 3.125mg  BID for now. Nurse also states she has been having constipation, PRN colace ordered.  Hilbert Corrigan PA Pager: [phone]

## 2015-02-04 DIAGNOSIS — N189 Chronic kidney disease, unspecified: Secondary | ICD-10-CM | POA: Diagnosis not present

## 2015-02-04 DIAGNOSIS — I255 Ischemic cardiomyopathy: Secondary | ICD-10-CM

## 2015-02-04 DIAGNOSIS — M545 Low back pain: Secondary | ICD-10-CM

## 2015-02-04 DIAGNOSIS — I25111 Atherosclerotic heart disease of native coronary artery with angina pectoris with documented spasm: Secondary | ICD-10-CM

## 2015-02-04 DIAGNOSIS — N289 Disorder of kidney and ureter, unspecified: Secondary | ICD-10-CM

## 2015-02-04 DIAGNOSIS — N183 Chronic kidney disease, stage 3 (moderate): Secondary | ICD-10-CM

## 2015-02-04 LAB — CBC
HCT: 36.7 % (ref 36.0–46.0)
Hemoglobin: 12.3 g/dL (ref 12.0–15.0)
MCH: 30.8 pg (ref 26.0–34.0)
MCHC: 33.5 g/dL (ref 30.0–36.0)
MCV: 91.8 fL (ref 78.0–100.0)
Platelets: 218 10*3/uL (ref 150–400)
RBC: 4 MIL/uL (ref 3.87–5.11)
RDW: 12.4 % (ref 11.5–15.5)
WBC: 6.1 10*3/uL (ref 4.0–10.5)

## 2015-02-04 LAB — BASIC METABOLIC PANEL
Anion gap: 10 (ref 5–15)
BUN: 29 mg/dL — ABNORMAL HIGH (ref 6–20)
CHLORIDE: 98 mmol/L — AB (ref 101–111)
CO2: 27 mmol/L (ref 22–32)
CREATININE: 1.6 mg/dL — AB (ref 0.44–1.00)
Calcium: 9 mg/dL (ref 8.9–10.3)
GFR calc Af Amer: 33 mL/min — ABNORMAL LOW (ref >60)
GFR calc non Af Amer: 28 mL/min — ABNORMAL LOW (ref >60)
Glucose, Bld: 145 mg/dL — ABNORMAL HIGH (ref 65–99)
Potassium: 4.6 mmol/L (ref 3.5–5.1)
SODIUM: 135 mmol/L (ref 135–145)

## 2015-02-04 LAB — GLUCOSE, CAPILLARY
GLUCOSE-CAPILLARY: 231 mg/dL — AB (ref 65–99)
GLUCOSE-CAPILLARY: 166 mg/dL — AB (ref 65–99)
Glucose-Capillary: 171 mg/dL — ABNORMAL HIGH (ref 65–99)

## 2015-02-04 NOTE — Progress Notes (Addendum)
Subjective:  No SOB, Still complaining of back and now Rt shoulder pain  Objective:  Vital Signs in the last 24 hours: Temp:  [97.9 F (36.6 C)-98.2 F (36.8 C)] 97.9 F (36.6 C) (06/11 0549) Pulse Rate:  [53-63] 63 (06/11 0549) Resp:  [16] 16 (06/11 0549) BP: (114-147)/(36-63) 134/55 mmHg (06/11 0549) SpO2:  [91 %-96 %] 94 % (06/11 0549) Weight:  [175 lb 4.8 oz (79.516 kg)] 175 lb 4.8 oz (79.516 kg) (06/11 0549)  Intake/Output from previous day:  Intake/Output Summary (Last 24 hours) at 02/04/15 0846 Last data filed at 02/03/15 1752  Gross per 24 hour  Intake    480 ml  Output      0 ml  Net    480 ml    Physical Exam: General appearance: alert, cooperative, no distress and morbidly obese Lungs: scatter rhonchi and tender along Rt T-spine to palpation Heart: regular rate and rhythm Neurologic: Grossly normal   Rate: 62  Rhythm: normal sinus rhythm, frequent PACs  Lab Results: No results for input(s): WBC, HGB, PLT in the last 72 hours.  Recent Labs  02/02/15 1053 02/04/15 0435  NA 137 135  K 4.7 4.6  CL 101 98*  CO2 28 27  GLUCOSE 195* 145*  BUN 30* 29*  CREATININE 1.67* 1.60*   No results for input(s): TROPONINI in the last 72 hours.  Invalid input(s): CK, MB No results for input(s): INR in the last 72 hours.  Scheduled Meds: . aspirin  81 mg Oral Daily  . atorvastatin  40 mg Oral q1800  . carvedilol  3.125 mg Oral BID  . cholecalciferol  1,000 Units Oral Daily  . citalopram  20 mg Oral Daily  . heparin  5,000 Units Subcutaneous 3 times per day  . insulin aspart  0-9 Units Subcutaneous TID WC  . irbesartan  150 mg Oral Daily  . isosorbide mononitrate  60 mg Oral Daily  . potassium chloride SA  20 mEq Oral BID  . sodium chloride  3 mL Intravenous Q12H   Continuous Infusions: . DOPamine Stopped (02/02/15 0410)   PRN Meds:.sodium chloride, acetaminophen, alum & mag hydroxide-simeth, docusate sodium, fluticasone, HYDROmorphone (DILAUDID)  injection, naLOXone (NARCAN)  injection, ondansetron (ZOFRAN) IV, sodium chloride, zolpidem   Imaging: Imaging results have been reviewed  Cardiac Studies: Echo 01/31/15 Study Conclusions  - Left ventricle: The cavity size was normal. Wall thickness was increased in a pattern of severe LVH. Systolic function was moderately to severely reduced. The estimated ejection fraction was in the range of 30% to 35%. There is severe hypokinesis of the entire inferolateral myocardium. Doppler parameters are consistent with abnormal left ventricular relaxation (grade 1 diastolic dysfunction). Doppler parameters are consistent with high ventricular filling pressure. - Aortic valve: There was mild regurgitation. - Mitral valve: Calcified annulus. Mildly thickened leaflets .   Assessment/Plan:  Mary Cook is a 81 y.o. female with a history of CAD, HTN, OA, diverticulitis, OSA on CPAP, diabetes, stroke, thrombosis with thrombectomy, hyperlipidemia, gastric bleed who presents with a 1 month hx of dyspnea on exertion. She was admitted with acute on chronic combined CHF. Her I/O is not negative and her wgt hasn't changed much but she is symptomatically improved. Her diuretics were backed off as her SCr rose. She fell on 6/8 and had sever back pain (XR negative). She had bradycardia and hypotension after pain medication, and another episode of bradycardia and hypotension on 02/03/15 while having a BM.    Principal  Problem:   Acute on chronic combined systolic and diastolic heart failure, EF 30-35% Active Problems:   CAD- Ant MI, CFX DES 9/11, no ISR or progression at cath 2012   Thoracic back pain after fall    Chronic combined systolic and diastolic congestive heart failure   Chronic kidney disease stage III   Acute on chronic renal insufficiency   Cardiomyopathy, ischemic-EF 30-35% by echo 01/31/15   Diabetes mellitus   Hyperlipidemia   Essential hypertension   Sleep apnea, cpap  had been stopped secondary to "sinus issues"   OSA on CPAP   PLAN:  Pt has had two episodes of severe bradycardia and hypotension- ? Related to medications and vagal stimulation. She does not appear to be ready for discharge. We may need help with pain management and she may possibly require SNF.  Diuretics have been stopped, will stop K+ supplement, check CBC.  [name] PA-C 02/04/2015 8:53 AM  Patient seen and examined. Agree with assessment and plan. Pt well known to me. Appears well compensated without overt CHF. Lungs clear. No edema. R back pain persists. No Fx.  Rhythm stable now with sinus at 63. Diuretics stopped; f/u bmet in am.   Troy Sine, MD, [facility] 02/04/2015 9:14 AM

## 2015-02-05 ENCOUNTER — Other Ambulatory Visit: Payer: Self-pay | Admitting: Cardiology

## 2015-02-05 DIAGNOSIS — N189 Chronic kidney disease, unspecified: Secondary | ICD-10-CM

## 2015-02-05 DIAGNOSIS — N289 Disorder of kidney and ureter, unspecified: Principal | ICD-10-CM

## 2015-02-05 LAB — GLUCOSE, CAPILLARY
GLUCOSE-CAPILLARY: 127 mg/dL — AB (ref 65–99)
Glucose-Capillary: 139 mg/dL — ABNORMAL HIGH (ref 65–99)

## 2015-02-05 MED ORDER — ACETAMINOPHEN 325 MG PO TABS: 650.0000 mg | ORAL_TABLET | ORAL | Status: DC | PRN

## 2015-02-05 MED ORDER — ALUM & MAG HYDROXIDE-SIMETH 200-200-20 MG/5ML PO SUSP: 30.0000 mL | ORAL | Status: DC | PRN

## 2015-02-05 NOTE — Discharge Instructions (Signed)

## 2015-02-05 NOTE — Progress Notes (Signed)
02/05/2015 11:29 AM Discharge AVS meds taken today and those due this evening reviewed.  Follow-up appointments and when to call md reviewed.  D/C IV and TELE.  Questions and concerns addressed.   D/C home per orders. [place]

## 2015-02-05 NOTE — Progress Notes (Signed)
Subjective:  Feels much better; asking about going home  Objective:  Vital Signs in the last 24 hours: Temp:  [97.9 F (36.6 C)-98.4 F (36.9 C)] 98.4 F (36.9 C) (06/12 0547) Pulse Rate:  [58-82] 58 (06/12 0547) Resp:  [17-18] 18 (06/11 1936) BP: (93-159)/(40-63) 143/52 mmHg (06/12 0547) SpO2:  [94 %-98 %] 94 % (06/12 0547) Weight:  [79.017 kg (174 lb 3.2 oz)] 79.017 kg (174 lb 3.2 oz) (06/12 0547)   Intake/Output Summary (Last 24 hours) at 02/05/15 0822 Last data filed at 02/05/15 0500  Gross per 24 hour  Intake    363 ml  Output    300 ml  Net     63 ml    Net I/O: -163  Physical Exam: General appearance: alert, cooperative, no distress   No JVD Lungs: clear today Heart: regular rate and rhythm Abd: BS +, nontender Back: small area of ecchymosis on L flank Ext: no edema Neurologic: Grossly normal   Rate: 66  Rhythm: normal sinus rhythm  Lab Results: BMP Latest Ref Rng 02/04/2015 02/02/2015 02/02/2015  Glucose 65 - 99 mg/dL 145(H) 195(H) 161(H)  BUN 6 - 20 mg/dL 29(H) 30(H) 29(H)  Creatinine 0.44 - 1.00 mg/dL 1.60(H) 1.67(H) 1.57(H)  Sodium 135 - 145 mmol/L 135 137 137  Potassium 3.5 - 5.1 mmol/L 4.6 4.7 4.1  Chloride 101 - 111 mmol/L 98(L) 101 100(L)  CO2 22 - 32 mmol/L $RemoveB'27 28 29  'RXJJCuob$ Calcium 8.9 - 10.3 mg/dL 9.0 9.0 9.1    Hepatic Function Latest Ref Rng 01/30/2015 10/12/2014 09/05/2014  Total Protein 6.5 - 8.1 g/dL 6.6 7.0 6.5  Albumin 3.5 - 5.0 g/dL 3.3(L) 3.7 3.5  AST 15 - 41 U/L $Remo'16 13 15  'jpPFk$ ALT 14 - 54 U/L 13(L) 10 12  Alk Phosphatase 38 - 126 U/L 76 81 95  Total Bilirubin 0.3 - 1.2 mg/dL 0.6 0.6 0.7  Bilirubin, Direct 0.0 - 0.3 mg/dL - 0.1 -   CBC Latest Ref Rng 02/04/2015 01/30/2015 12/02/2014  WBC 4.0 - 10.5 K/uL 6.1 6.3 5.9  Hemoglobin 12.0 - 15.0 g/dL 12.3 12.1 12.3  Hematocrit 36.0 - 46.0 % 36.7 35.3(L) 36.0  Platelets 150 - 400 K/uL 218 179 188.0   Lab Results  Component Value Date   MCV 91.8 02/04/2015   MCV 90.3 01/30/2015   MCV 90.1 12/02/2014     Lipid Panel     Component Value Date/Time   CHOL 165 09/05/2014 1218   TRIG 93 09/05/2014 1218   HDL 70 09/05/2014 1218   CHOLHDL 2.4 09/05/2014 1218   VLDL 19 09/05/2014 1218   LDLCALC 76 09/05/2014 1218   LDLDIRECT 180.7 12/09/2011 1109    No results for input(s): TROPONINI in the last 72 hours.  Invalid input(s): CK, MB No results for input(s): INR in the last 72 hours.  Scheduled Meds: . aspirin  81 mg Oral Daily  . atorvastatin  40 mg Oral q1800  . carvedilol  3.125 mg Oral BID  . cholecalciferol  1,000 Units Oral Daily  . citalopram  20 mg Oral Daily  . heparin  5,000 Units Subcutaneous 3 times per day  . insulin aspart  0-9 Units Subcutaneous TID WC  . irbesartan  150 mg Oral Daily  . isosorbide mononitrate  60 mg Oral Daily  . sodium chloride  3 mL Intravenous Q12H   Continuous Infusions: . DOPamine Stopped (02/02/15 0410)   PRN Meds:.sodium chloride, acetaminophen, alum & mag hydroxide-simeth, docusate sodium, fluticasone, ondansetron (  ZOFRAN) IV, sodium chloride   Imaging: Imaging results have been reviewed  Cardiac Studies: Echo 01/31/15 Study Conclusions  - Left ventricle: The cavity size was normal. Wall thickness was increased in a pattern of severe LVH. Systolic function was moderately to severely reduced. The estimated ejection fraction was in the range of 30% to 35%. There is severe hypokinesis of the entire inferolateral myocardium. Doppler parameters are consistent with abnormal left ventricular relaxation (grade 1 diastolic dysfunction). Doppler parameters are consistent with high ventricular filling pressure. - Aortic valve: There was mild regurgitation. - Mitral valve: Calcified annulus. Mildly thickened leaflets .   Assessment/Plan:  Mary Cook is a 81 y.o. female with a history of CAD, HTN, OA, diverticulitis, OSA on CPAP, diabetes, stroke, thrombosis with thrombectomy, hyperlipidemia, gastric bleed who presents  with a 1 month hx of dyspnea on exertion. She was admitted with acute on chronic combined CHF. Her I/O is not negative and her wgt hasn't changed much but she is symptomatically improved. Her diuretics were backed off as her SCr rose. She fell on 6/8 and had sever back pain (XR negative). She had bradycardia and hypotension after pain medication, and another episode of bradycardia and hypotension on 02/03/15 while having a BM.    Principal Problem:   Acute on chronic combined systolic and diastolic heart failure, EF 30-35% Active Problems:   Diabetes mellitus   Hyperlipidemia   Essential hypertension   CAD- Ant MI, CFX DES 9/11, no ISR or progression at cath 2012   Sleep apnea, cpap had been stopped secondary to "sinus issues"   Thoracic back pain after fall    Chronic combined systolic and diastolic congestive heart failure   OSA on CPAP   Chronic kidney disease stage III   Acute on chronic renal insufficiency   Cardiomyopathy, ischemic-EF 30-35% by echo 01/31/15  Pt feels much better. No overt CHF on exam. Back is improved. Cr stable with stage 3 renal insufficiency. Will dc today; f/u with extender in 1-2 weeks and with me in 4 weeks.  Troy Sine, MD, [facility] 02/05/2015 8:22 AM

## 2015-02-05 NOTE — Discharge Summary (Signed)
Patient ID: Mary Cook,  MRN: [id number], DOB/AGE: 04/06/1926 81 y.o.  Admit date: 01/30/2015 Discharge date: 02/05/2015  Primary Care Provider: Walker Kehr, MD Primary Cardiologist: Dr Claiborne Billings  Discharge Diagnoses Principal Problem:   Acute on chronic combined systolic and diastolic heart failure, EF 30-35% Active Problems:   CAD- Ant MI, CFX DES 9/11, no ISR or progression at cath 2012   Thoracic back pain after fall    Chronic combined systolic and diastolic congestive heart failure   Chronic kidney disease stage III   Acute on chronic renal insufficiency   Cardiomyopathy, ischemic-EF 30-35% by echo 01/31/15   Diabetes mellitus   Hyperlipidemia   Essential hypertension   Sleep apnea, cpap had been stopped secondary to "sinus issues"   OSA on CPAP    Hospital Course:  Ms Mary Cook is a 81 y.o. female with a history of CAD, HTN, OA, diverticulitis, OSA on CPAP, diabetes, stroke, thrombosis with thrombectomy, hyperlipidemia, stage 3 CRI, and prior gastric bleed who presents with a 1 month hx of dyspnea on exertion. She was admitted 01/30/15 with acute on chronic combined CHF. Her I/O is not negative and her wgt only dropped 4 lbs but she is symptomatically improved. Her diuretics were backed off as her SCr rose. She fell on 6/8 and had sever back pain (XR negative). She had bradycardia and hypotension after pain medication, and another episode of bradycardia and hypotension on 02/03/15 while having a BM. Her back pain kept her in the hospital till 6/12 when she felt improved and Dr Claiborne Billings feel's she can be discharged. Her SCr is still a little above her baseline. We have resumed her home dose Lasix but she will need a BMP mid week.   Discharge Vitals:  Blood pressure 143/52, pulse 58, temperature 98.4 F (36.9 C), temperature source Oral, resp. rate 18, height 5\' 5"  (1.651 m), weight 174 lb 3.2 oz (79.017 kg), SpO2 94 %.    Labs: Results for orders placed or performed  during the hospital encounter of 01/30/15 (from the past 24 hour(s))  CBC     Status: None   Collection Time: 02/04/15 10:26 AM  Result Value Ref Range   WBC 6.1 4.0 - 10.5 K/uL   RBC 4.00 3.87 - 5.11 MIL/uL   Hemoglobin 12.3 12.0 - 15.0 g/dL   HCT 36.7 36.0 - 46.0 %   MCV 91.8 78.0 - 100.0 fL   MCH 30.8 26.0 - 34.0 pg   MCHC 33.5 30.0 - 36.0 g/dL   RDW 12.4 11.5 - 15.5 %   Platelets 218 150 - 400 K/uL  Glucose, capillary     Status: Abnormal   Collection Time: 02/04/15 11:27 AM  Result Value Ref Range   Glucose-Capillary 231 (H) 65 - 99 mg/dL  Glucose, capillary     Status: Abnormal   Collection Time: 02/04/15  4:25 PM  Result Value Ref Range   Glucose-Capillary 166 (H) 65 - 99 mg/dL  Glucose, capillary     Status: Abnormal   Collection Time: 02/04/15  9:18 PM  Result Value Ref Range   Glucose-Capillary 139 (H) 65 - 99 mg/dL  Glucose, capillary     Status: Abnormal   Collection Time: 02/05/15  6:07 AM  Result Value Ref Range   Glucose-Capillary 127 (H) 65 - 99 mg/dL    Disposition:      Follow-up Information    Follow up with Troy Sine, MD.   Specialty:  Cardiology   Why:  offcie will call you   Contact information:   [address] [place] [address] [phone]       Discharge Medications:    Medication List    STOP taking these medications        cholecalciferol 1000 UNITS tablet  Commonly known as:  VITAMIN D     metFORMIN 500 MG tablet  Commonly known as:  GLUCOPHAGE      TAKE these medications        ACCU-CHEK AVIVA PLUS test strip  Generic drug:  glucose blood     ACCU-CHEK SOFTCLIX LANCETS lancets     acetaminophen 325 MG tablet  Commonly known as:  TYLENOL  Take 2 tablets (650 mg total) by mouth every 4 (four) hours as needed for headache or mild pain.     alum & mag hydroxide-simeth 200-200-20 MG/5ML suspension  Commonly known as:  MAALOX/MYLANTA  Take 30 mLs by mouth every 2 (two) hours as needed for  indigestion.     aspirin 81 MG EC tablet  Take 81 mg by mouth daily.     atorvastatin 40 MG tablet  Commonly known as:  LIPITOR  Take 1 tablet (40 mg total) by mouth daily at 6 PM.     B-D SINGLE USE SWABS REGULAR Pads     carvedilol 3.125 MG tablet  Commonly known as:  COREG  Take 1 tablet (3.125 mg total) by mouth 2 (two) times daily.     citalopram 20 MG tablet  Commonly known as:  CELEXA  Take 1 tablet (20 mg total) by mouth daily.     fluticasone 50 MCG/ACT nasal spray  Commonly known as:  FLONASE  Place 1 spray into both nostrils daily as needed for allergies or rhinitis.     irbesartan 150 MG tablet  Commonly known as:  AVAPRO  Take 1/2 tablet daily     isosorbide mononitrate 60 MG 24 hr tablet  Commonly known as:  IMDUR  Take 1 tablet (60 mg total) by mouth daily.     omeprazole 20 MG capsule  Commonly known as:  PRILOSEC  Take 1 capsule (20 mg total) by mouth daily.     Vitamin B-12 500 MCG Subl  Place 2 tablets (1,000 mcg total) under the tongue 1 day or 1 dose.      ASK your doctor about these medications        furosemide 40 MG tablet  Commonly known as:  LASIX  Take 1 tablet (40 mg total) by mouth daily.     potassium chloride SA 20 MEQ tablet  Commonly known as:  K-DUR,KLOR-CON  Take 1 tablet (20 mEq total) by mouth 2 (two) times daily.         Duration of Discharge Encounter: Greater than 30 minutes including physician time.  Angelena Form PA-C 02/05/2015 9:25 AM

## 2015-02-07 ENCOUNTER — Telehealth: Payer: Self-pay | Admitting: *Deleted

## 2015-02-07 NOTE — Telephone Encounter (Signed)
Pt was on tcm list d/c 02/05/15 Chronic CHF pt will f/u up with cardiology on 02/15/15...Mary Cook

## 2015-02-11 ENCOUNTER — Encounter (HOSPITAL_COMMUNITY): Payer: Self-pay | Admitting: Emergency Medicine

## 2015-02-11 ENCOUNTER — Emergency Department (HOSPITAL_COMMUNITY): Payer: Commercial Managed Care - HMO

## 2015-02-11 ENCOUNTER — Emergency Department (HOSPITAL_COMMUNITY)
Admission: EM | Admit: 2015-02-11 | Discharge: 2015-02-11 | Disposition: A | Discharge status: Home / Self Care | Payer: Commercial Managed Care - HMO | Attending: Emergency Medicine | Admitting: Emergency Medicine

## 2015-02-11 DIAGNOSIS — Z79899 Other long term (current) drug therapy: Secondary | ICD-10-CM | POA: Insufficient documentation

## 2015-02-11 DIAGNOSIS — I252 Old myocardial infarction: Secondary | ICD-10-CM | POA: Diagnosis not present

## 2015-02-11 DIAGNOSIS — E785 Hyperlipidemia, unspecified: Secondary | ICD-10-CM | POA: Insufficient documentation

## 2015-02-11 DIAGNOSIS — G4730 Sleep apnea, unspecified: Secondary | ICD-10-CM | POA: Insufficient documentation

## 2015-02-11 DIAGNOSIS — I509 Heart failure, unspecified: Secondary | ICD-10-CM | POA: Insufficient documentation

## 2015-02-11 DIAGNOSIS — M545 Low back pain: Secondary | ICD-10-CM | POA: Insufficient documentation

## 2015-02-11 DIAGNOSIS — W01118A Fall on same level from slipping, tripping and stumbling with subsequent striking against other sharp object, initial encounter: Secondary | ICD-10-CM | POA: Diagnosis not present

## 2015-02-11 DIAGNOSIS — Z872 Personal history of diseases of the skin and subcutaneous tissue: Secondary | ICD-10-CM | POA: Insufficient documentation

## 2015-02-11 DIAGNOSIS — Z8719 Personal history of other diseases of the digestive system: Secondary | ICD-10-CM | POA: Insufficient documentation

## 2015-02-11 DIAGNOSIS — Z9861 Coronary angioplasty status: Secondary | ICD-10-CM | POA: Insufficient documentation

## 2015-02-11 DIAGNOSIS — Z9981 Dependence on supplemental oxygen: Secondary | ICD-10-CM | POA: Diagnosis not present

## 2015-02-11 DIAGNOSIS — M1990 Unspecified osteoarthritis, unspecified site: Secondary | ICD-10-CM | POA: Insufficient documentation

## 2015-02-11 DIAGNOSIS — M546 Pain in thoracic spine: Secondary | ICD-10-CM | POA: Insufficient documentation

## 2015-02-11 DIAGNOSIS — S3992XA Unspecified injury of lower back, initial encounter: Secondary | ICD-10-CM | POA: Diagnosis not present

## 2015-02-11 DIAGNOSIS — Z9889 Other specified postprocedural states: Secondary | ICD-10-CM | POA: Insufficient documentation

## 2015-02-11 DIAGNOSIS — E119 Type 2 diabetes mellitus without complications: Secondary | ICD-10-CM | POA: Insufficient documentation

## 2015-02-11 DIAGNOSIS — Z7982 Long term (current) use of aspirin: Secondary | ICD-10-CM | POA: Diagnosis not present

## 2015-02-11 DIAGNOSIS — M549 Dorsalgia, unspecified: Secondary | ICD-10-CM

## 2015-02-11 DIAGNOSIS — Z8673 Personal history of transient ischemic attack (TIA), and cerebral infarction without residual deficits: Secondary | ICD-10-CM | POA: Insufficient documentation

## 2015-02-11 DIAGNOSIS — S299XXA Unspecified injury of thorax, initial encounter: Secondary | ICD-10-CM | POA: Diagnosis not present

## 2015-02-11 DIAGNOSIS — I10 Essential (primary) hypertension: Secondary | ICD-10-CM | POA: Insufficient documentation

## 2015-02-11 DIAGNOSIS — W19XXXD Unspecified fall, subsequent encounter: Secondary | ICD-10-CM

## 2015-02-11 DIAGNOSIS — I2510 Atherosclerotic heart disease of native coronary artery without angina pectoris: Secondary | ICD-10-CM | POA: Insufficient documentation

## 2015-02-11 MED ORDER — HYDROCODONE-ACETAMINOPHEN 5-325 MG PO TABS
1.0000 | ORAL_TABLET | Freq: Once | ORAL | Status: AC
  Administered 2015-02-11: 1 via ORAL
  Filled 2015-02-11: qty 1

## 2015-02-11 MED ORDER — METHOCARBAMOL 500 MG PO TABS
500.0000 mg | ORAL_TABLET | Freq: Once | ORAL | Status: AC
  Administered 2015-02-11: 500 mg via ORAL
  Filled 2015-02-11: qty 1

## 2015-02-11 MED ORDER — LIDOCAINE 5 % EX PTCH: 1.0000 | MEDICATED_PATCH | Freq: Once | CUTANEOUS | Status: DC

## 2015-02-11 MED ORDER — HYDROCODONE-ACETAMINOPHEN 5-325 MG PO TABS: 1.0000 | ORAL_TABLET | Freq: Four times a day (QID) | ORAL | Status: DC | PRN

## 2015-02-11 MED ORDER — METHOCARBAMOL 500 MG PO TABS
500.0000 mg | ORAL_TABLET | Freq: Three times a day (TID) | ORAL | Status: DC | PRN
Start: 2015-02-11 — End: 2015-02-13

## 2015-02-11 MED ORDER — LIDOCAINE 5 % EX PTCH
1.0000 | MEDICATED_PATCH | Freq: Once | CUTANEOUS | Status: DC
  Administered 2015-02-11: 1 via TRANSDERMAL
  Filled 2015-02-11: qty 1

## 2015-02-11 NOTE — Discharge Instructions (Signed)
Please follow-up with your doctor this week for recheck and possible referral physical therapy.  Take medications as prescribed.  Start slow with the medications, using only when needed.    Back Pain, Adult Low back pain is very common. About 1 in 5 people have back pain.The cause of low back pain is rarely dangerous. The pain often gets better over time.About half of people with a sudden onset of back pain feel better in just 2 weeks. About 8 in 10 people feel better by 6 weeks.  CAUSES Some common causes of back pain include:  Strain of the muscles or ligaments supporting the spine.  Wear and tear (degeneration) of the spinal discs.  Arthritis.  Direct injury to the back. DIAGNOSIS Most of the time, the direct cause of low back pain is not known.However, back pain can be treated effectively even when the exact cause of the pain is unknown.Answering your caregiver's questions about your overall health and symptoms is one of the most accurate ways to make sure the cause of your pain is not dangerous. If your caregiver needs more information, he or she may order lab work or imaging tests (X-rays or MRIs).However, even if imaging tests show changes in your back, this usually does not require surgery. HOME CARE INSTRUCTIONS For many people, back pain returns.Since low back pain is rarely dangerous, it is often a condition that people can learn to [facility] their own.   Remain active. It is stressful on the back to sit or stand in one place. Do not sit, drive, or stand in one place for more than 30 minutes at a time. Take short walks on level surfaces as soon as pain allows.Try to increase the length of time you walk each day.  Do not stay in bed.Resting more than 1 or 2 days can delay your recovery.  Do not avoid exercise or work.Your body is made to move.It is not dangerous to be active, even though your back may hurt.Your back will likely heal faster if you return to being active  before your pain is gone.  Pay attention to your body when you bend and lift. Many people have less discomfortwhen lifting if they bend their knees, keep the load close to their bodies,and avoid twisting. Often, the most comfortable positions are those that put less stress on your recovering back.  Find a comfortable position to sleep. Use a firm mattress and lie on your side with your knees slightly bent. If you lie on your back, put a pillow under your knees.  Only take over-the-counter or prescription medicines as directed by your caregiver. Over-the-counter medicines to reduce pain and inflammation are often the most helpful.Your caregiver may prescribe muscle relaxant drugs.These medicines help dull your pain so you can more quickly return to your normal activities and healthy exercise.  Put ice on the injured area.  Put ice in a plastic bag.  Place a towel between your skin and the bag.  Leave the ice on for 15-20 minutes, 03-04 times a day for the first 2 to 3 days. After that, ice and heat may be alternated to reduce pain and spasms.  Ask your caregiver about trying back exercises and gentle massage. This may be of some benefit.  Avoid feeling anxious or stressed.Stress increases muscle tension and can worsen back pain.It is important to recognize when you are anxious or stressed and learn ways to manage it.Exercise is a great option. SEEK MEDICAL CARE IF:  You have pain  that is not relieved with rest or medicine.  You have pain that does not improve in 1 week.  You have new symptoms.  You are generally not feeling well. SEEK IMMEDIATE MEDICAL CARE IF:   You have pain that radiates from your back into your legs.  You develop new bowel or bladder control problems.  You have unusual weakness or numbness in your arms or legs.  You develop nausea or vomiting.  You develop abdominal pain.  You feel faint. Document Released: 08/12/2005 Document Revised: 02/11/2012  Document Reviewed: 12/14/2013 [facility] Patient Information 2015 [place], [place]. This information is not intended to replace advice given to you by your health care provider. Make sure you discuss any questions you have with your health care provider.

## 2015-02-11 NOTE — ED Provider Notes (Signed)
CSN: [id number]     Arrival date & time 02/11/15  3716 History   None    This chart was scribed for Linton Flemings, MD by Forrestine Him, ED Scribe. This patient was seen in room A05C/A05C and the patient's care was started 3:28 AM.   Chief Complaint  Patient presents with  . Back Pain   The history is provided by the patient. No language interpreter was used.    HPI Comments: Mary Cook is a 81 y.o. female with a PMHx of HTN, osteoarthritis, DM, CAD, MI, hyperlipidemia, and stroke who presents to the Emergency Department complaining of constant, ongoing, unchanged back pain x 1 week. Mary Cook was admitted to the hospital on 6/6 for acute on chronic congestive heart failure with dyspnea. During stay in hospital, pt states she sustained a fall on 6/8 resulting in her hitting her back against the bedside camoude. X-Rays performed without any abnormal findings. OTC Tylenol attempted prior to arrival without any improvement. Stronger pain medications attempted in hospital, however, pt states she did not do well on those medications. No recent fever or chills. No weakness, loss of sensation, or numbness. Pt with known allergies to Clopidogrel bisulfate, Lipitor, and Lisinopril.  Past Medical History  Diagnosis Date  . Hypertension   . Osteoarthritis   . Osteopenia   . Diverticulosis of colon   . Diabetes mellitus     type II  . CAD (coronary artery disease)   . MI (myocardial infarction) 05/10/2010    Dr Claiborne Billings, Rx'd with CFX  DES  . HYPERLIPIDEMIA 01/07/2008  . GIB (gastrointestinal bleeding) 2002    Diverticular  . Skin disorder   . Visual disorder   . Thrombus   . Sleep apnea     cpap therapy  . Shortness of breath dyspnea   . Frequent urination   . Cerebral aneurysm   . Stroke 2002   Past Surgical History  Procedure Laterality Date  . Abdominal hysterectomy  1980    no bso  . Coronary angioplasty with stent placement  05/11/2010    stent CFX  . Cardiac catheterization   07/2011    LAD OK, D1 80%, CFX 30% w/ patent stent, RCA 50-60%, EF 40%  . Thrombectomy    . Cardiac catheterization    . Doppler echocardiography    . Myocardial perfusion study    . Left heart catheterization with coronary angiogram N/A 08/18/2011    Procedure: LEFT HEART CATHETERIZATION WITH CORONARY ANGIOGRAM;  Surgeon: Wellington Hampshire, MD;  Location: [facility] CATH LAB;  Service: Cardiovascular;  Laterality: N/A;  . Left heart catheterization with coronary angiogram N/A 06/08/2014    Procedure: LEFT HEART CATHETERIZATION WITH CORONARY ANGIOGRAM;  Surgeon: Troy Sine, MD;  Location: [facility] CATH LAB;  Service: Cardiovascular;  Laterality: N/A;  . Tonsillectomy    . Appendectomy     Family History  Problem Relation Age of Onset  . Hypertension Other   . CAD      father   History  Substance Use Topics  . Smoking status: Never Smoker   . Smokeless tobacco: Never Used  . Alcohol Use: No   OB History    No data available     Review of Systems  Constitutional: Negative for fever and chills.  Respiratory: Negative for shortness of breath.   Cardiovascular: Negative for chest pain.  Gastrointestinal: Negative for nausea, vomiting and diarrhea.  Musculoskeletal: Positive for back pain.  All other systems reviewed and are negative.  Allergies  Clopidogrel bisulfate; Lipitor; and Lisinopril  Home Medications   Prior to Admission medications   Medication Sig Start Date End Date Taking? Authorizing Provider  ACCU-CHEK AVIVA PLUS test strip  10/21/14   Historical Provider, MD  ACCU-CHEK SOFTCLIX LANCETS lancets  10/21/14   Historical Provider, MD  acetaminophen (TYLENOL) 325 MG tablet Take 2 tablets (650 mg total) by mouth every 4 (four) hours as needed for headache or mild pain. 02/05/15   Erlene Quan, PA-C  Alcohol Swabs (B-D SINGLE USE SWABS REGULAR) PADS  10/21/14   Historical Provider, MD  alum & mag hydroxide-simeth (MAALOX/MYLANTA) 200-200-20 MG/5ML suspension Take 30 mLs by  mouth every 2 (two) hours as needed for indigestion. 02/05/15   Erlene Quan, PA-C  aspirin 81 MG EC tablet Take 81 mg by mouth daily.      Historical Provider, MD  atorvastatin (LIPITOR) 40 MG tablet Take 1 tablet (40 mg total) by mouth daily at 6 PM. 12/02/14   Aleksei Plotnikov V, MD  carvedilol (COREG) 3.125 MG tablet Take 1 tablet (3.125 mg total) by mouth 2 (two) times daily. 09/05/14   Troy Sine, MD  citalopram (CELEXA) 20 MG tablet Take 1 tablet (20 mg total) by mouth daily. 12/02/14   Aleksei Plotnikov V, MD  Cyanocobalamin (VITAMIN B-12) 500 MCG SUBL Place 2 tablets (1,000 mcg total) under the tongue 1 day or 1 dose. 12/02/14   Aleksei Plotnikov V, MD  fluticasone (FLONASE) 50 MCG/ACT nasal spray Place 1 spray into both nostrils daily as needed for allergies or rhinitis.    Historical Provider, MD  furosemide (LASIX) 40 MG tablet Take 1 tablet (40 mg total) by mouth daily. 12/02/14   Aleksei Plotnikov V, MD  irbesartan (AVAPRO) 150 MG tablet Take 1/2 tablet daily 12/04/14   Aleksei Plotnikov V, MD  isosorbide mononitrate (IMDUR) 60 MG 24 hr tablet Take 1 tablet (60 mg total) by mouth daily. 07/05/14   Troy Sine, MD  omeprazole (PRILOSEC) 20 MG capsule Take 1 capsule (20 mg total) by mouth daily. 12/02/14   Aleksei Plotnikov V, MD   Triage Vitals: BP 164/69 mmHg  Pulse 62  Temp(Src) 97.6 F (36.4 C) (Oral)  Resp 18  Ht 5\' 5"  (1.651 m)  Wt 172 lb (78.019 kg)  BMI 28.62 kg/m2  SpO2 97%   Physical Exam  Constitutional: She is oriented to person, place, and time. She appears well-developed and well-nourished. No distress.  HENT:  Head: Normocephalic and atraumatic.  Eyes: EOM are normal.  Neck: Normal range of motion.  Cardiovascular: Normal rate, regular rhythm and normal heart sounds.   Pulmonary/Chest: Effort normal and breath sounds normal.  Abdominal: Soft. She exhibits no distension. There is no tenderness.  Musculoskeletal: Normal range of motion. She exhibits tenderness.   Upper lumbar and lower thoracic tenderness to palpation noted  Neurological: She is alert and oriented to person, place, and time.  Skin: Skin is warm and dry.  Psychiatric: She has a normal mood and affect. Judgment normal.  Nursing note and vitals reviewed.   ED Course  Procedures (including critical care time)  DIAGNOSTIC STUDIES: Oxygen Saturation is 97% on RA, adequate by my interpretation.    COORDINATION OF CARE: 3:36 AM-Discussed treatment plan with pt at bedside and pt agreed to plan.     Labs Review Labs Reviewed - No data to display  Imaging Review Dg Ribs Unilateral W/chest Right  02/11/2015   CLINICAL DATA:  Back pain.  Dropped  by a nurse 2 days ago.  EXAM: RIGHT RIBS AND CHEST - 3+ VIEW  COMPARISON:  Chest 02/02/2015  FINDINGS: Mild cardiac enlargement. Normal pulmonary vascularity. No focal airspace disease or consolidation in the lungs. No blunting of costophrenic angles. No pneumothorax. Calcification of the aorta.  Right ribs appear intact. No acute displaced rib fractures identified. No focal bone lesions.  IMPRESSION: Mild cardiac enlargement. No evidence of active pulmonary disease. Negative right ribs.   Electronically Signed   By: Lucienne Capers M.D.   On: 02/11/2015 04:32   Dg Thoracic Spine 2 View  02/11/2015   CLINICAL DATA:  Back pain.  Dropped by a nurse 2 days ago.  EXAM: THORACIC SPINE - 2 VIEW  COMPARISON:  02/01/2015  FINDINGS: Degenerative changes throughout the thoracic spine with narrowed interspaces and associated endplate hypertrophic changes. Diffuse bone demineralization. Normal alignment. No vertebral compression deformities. Bone cortex and trabecular architecture appear intact. No significant paraspinal soft tissue swelling. Calcification in the mitral valve annulus. Calcification of the aorta. Vascular calcifications in the upper abdomen.  IMPRESSION: Degenerative changes in the thoracic spine. Normal alignment. No acute displaced fractures.    Electronically Signed   By: Lucienne Capers M.D.   On: 02/11/2015 04:26   Dg Lumbar Spine Complete  02/11/2015   CLINICAL DATA:  Initial valuation for recent trauma, fall, back pain.  EXAM: LUMBAR SPINE - COMPLETE 4+ VIEW  COMPARISON:  None.  FINDINGS: Vertebral bodies are normally aligned with preservation of the normal lumbar lordosis. Vertebral body heights are maintained. No fracture or listhesis. Sacrum grossly intact.  Multilevel advanced degenerative disc disease and facet arthropathy present. Diffuse osteopenia noted. Partially visualized pelvis intact.  Prominent atheromatous calcifications present within the intra-abdominal aorta and splenic artery.  IMPRESSION: 1. No acute traumatic injury within the lumbar spine. 2. Advanced multilevel degenerative disc disease and facet arthropathy within the lumbar spine.   Electronically Signed   By: Jeannine Boga M.D.   On: 02/11/2015 04:35     EKG Interpretation None      MDM   Final diagnoses:  Fall, subsequent encounter  Mid back pain on right side    81 year old female status post fall in the hospital 12 days ago with persistent pain.  Plan for repeat x-rays and pain control  I personally performed the services described in this documentation, which was scribed in my presence. The recorded information has been reviewed and is accurate.  Patient reevaluated, at this time.  She reports the pain is tolerable.  Patient has received lidocaine patch, Robaxin and Vicodin.  Will send patient home on same.  Patient instructed that medications are sedating, and she will need to take it slow.    Linton Flemings, MD 02/11/15 [phone]

## 2015-02-13 ENCOUNTER — Encounter: Payer: Self-pay | Admitting: Internal Medicine

## 2015-02-13 ENCOUNTER — Ambulatory Visit (INDEPENDENT_AMBULATORY_CARE_PROVIDER_SITE_OTHER): Payer: Commercial Managed Care - HMO | Admitting: Internal Medicine

## 2015-02-13 VITALS — BP 130/68 | HR 60 | Wt 173.0 lb

## 2015-02-13 DIAGNOSIS — I255 Ischemic cardiomyopathy: Secondary | ICD-10-CM | POA: Diagnosis not present

## 2015-02-13 DIAGNOSIS — I5020 Unspecified systolic (congestive) heart failure: Secondary | ICD-10-CM | POA: Diagnosis not present

## 2015-02-13 DIAGNOSIS — M5440 Lumbago with sciatica, unspecified side: Secondary | ICD-10-CM | POA: Diagnosis not present

## 2015-02-13 DIAGNOSIS — I5042 Chronic combined systolic (congestive) and diastolic (congestive) heart failure: Secondary | ICD-10-CM

## 2015-02-13 DIAGNOSIS — M545 Low back pain, unspecified: Secondary | ICD-10-CM | POA: Insufficient documentation

## 2015-02-13 MED ORDER — HYDROCODONE-ACETAMINOPHEN 5-325 MG PO TABS: 0.5000 | ORAL_TABLET | Freq: Four times a day (QID) | ORAL | Status: DC | PRN

## 2015-02-13 NOTE — Progress Notes (Signed)
Pre visit review using our clinic review tool, if applicable. No additional management support is needed unless otherwise documented below in the visit note. 

## 2015-02-13 NOTE — Assessment & Plan Note (Addendum)
Chronic. States BP is nl at home  On Coreg, Avapro, Lasix

## 2015-02-13 NOTE — Progress Notes (Signed)
Subjective:    HPI Post-hosp f/u:   Admit date: 01/30/2015 Discharge date: 02/05/2015  Primary Care Provider: Walker Kehr, MD Primary Cardiologist: Dr Claiborne Billings  Discharge Diagnoses Principal Problem:  Acute on chronic combined systolic and diastolic heart failure, EF 30-35% Active Problems:  CAD- Ant MI, CFX DES 9/11, no ISR or progression at cath 2012  Thoracic back pain after fall   Chronic combined systolic and diastolic congestive heart failure  Chronic kidney disease stage III  Acute on chronic renal insufficiency  Cardiomyopathy, ischemic-EF 30-35% by echo 01/31/15  Diabetes mellitus  Hyperlipidemia  Essential hypertension  Sleep apnea, cpap had been stopped secondary to "sinus issues"  OSA on CPAP    Hospital Course: Ms Seiler is a 81 y.o. female with a history of CAD, HTN, OA, diverticulitis, OSA on CPAP, diabetes, stroke, thrombosis with thrombectomy, hyperlipidemia, stage 3 CRI, and prior gastric bleed who presents with a 1 month hx of dyspnea on exertion. She was admitted 01/30/15 with acute on chronic combined CHF. Her I/O is not negative and her wgt only dropped 4 lbs but she is symptomatically improved. Her diuretics were backed off as her SCr rose. She fell on 6/8 and had sever back pain (XR negative). She had bradycardia and hypotension after pain medication, and another episode of bradycardia and hypotension on 02/03/15 while having a BM. Her back pain kept her in the hospital till 6/12 when she felt improved and Dr Claiborne Billings feel's she can be discharged. Her SCr is still a little above her baseline. We have resumed her home dose Lasix but she will need a BMP mid week.   Discharge Vitals:  Blood pressure 143/52, pulse 58, temperature 98.4 F (36.9 C), temperature source Oral, resp. rate 18, height 5\' 5"  (1.651 m), weight 174 lb 3.2 oz (79.017 kg), SpO2 94 %.    Labs:  Lab Results Last 24 Hours    Results for orders placed or performed during the  hospital encounter of 01/30/15 (from the past 24 hour(s))  CBC Status: None   Collection Time: 02/04/15 10:26 AM  Result Value Ref Range   WBC 6.1 4.0 - 10.5 K/uL   RBC 4.00 3.87 - 5.11 MIL/uL   Hemoglobin 12.3 12.0 - 15.0 g/dL   HCT 36.7 36.0 - 46.0 %   MCV 91.8 78.0 - 100.0 fL   MCH 30.8 26.0 - 34.0 pg   MCHC 33.5 30.0 - 36.0 g/dL   RDW 12.4 11.5 - 15.5 %   Platelets 218 150 - 400 K/uL  Glucose, capillary Status: Abnormal   Collection Time: 02/04/15 11:27 AM  Result Value Ref Range   Glucose-Capillary 231 (H) 65 - 99 mg/dL  Glucose, capillary Status: Abnormal   Collection Time: 02/04/15 4:25 PM  Result Value Ref Range   Glucose-Capillary 166 (H) 65 - 99 mg/dL  Glucose, capillary Status: Abnormal   Collection Time: 02/04/15 9:18 PM  Result Value Ref Range   Glucose-Capillary 139 (H) 65 - 99 mg/dL  Glucose, capillary Status: Abnormal   Collection Time: 02/05/15 6:07 AM  Result Value Ref Range   Glucose-Capillary 127 (H) 65 - 99 mg/dL      Disposition:      Follow-up Information    Follow up with Troy Sine, MD.   Specialty: Cardiology   Why: offcie will call you   Contact information:   [address] [place] [address] [phone]       Discharge Medications:    Medication List  STOP taking these medications       cholecalciferol 1000 UNITS tablet  Commonly known as: VITAMIN D     metFORMIN 500 MG tablet  Commonly known as: GLUCOPHAGE      TAKE these medications       ACCU-CHEK AVIVA PLUS test strip  Generic drug: glucose blood     ACCU-CHEK SOFTCLIX LANCETS lancets     acetaminophen 325 MG tablet  Commonly known as: TYLENOL  Take 2 tablets (650 mg total) by mouth every 4 (four) hours as needed for headache or mild pain.     alum & mag hydroxide-simeth 200-200-20  MG/5ML suspension  Commonly known as: MAALOX/MYLANTA  Take 30 mLs by mouth every 2 (two) hours as needed for indigestion.     aspirin 81 MG EC tablet  Take 81 mg by mouth daily.     atorvastatin 40 MG tablet  Commonly known as: LIPITOR  Take 1 tablet (40 mg total) by mouth daily at 6 PM.     B-D SINGLE USE SWABS REGULAR Pads     carvedilol 3.125 MG tablet  Commonly known as: COREG  Take 1 tablet (3.125 mg total) by mouth 2 (two) times daily.     citalopram 20 MG tablet  Commonly known as: CELEXA  Take 1 tablet (20 mg total) by mouth daily.     fluticasone 50 MCG/ACT nasal spray  Commonly known as: FLONASE  Place 1 spray into both nostrils daily as needed for allergies or rhinitis.     irbesartan 150 MG tablet  Commonly known as: AVAPRO  Take 1/2 tablet daily     isosorbide mononitrate 60 MG 24 hr tablet  Commonly known as: IMDUR  Take 1 tablet (60 mg total) by mouth daily.     omeprazole 20 MG capsule  Commonly known as: PRILOSEC  Take 1 capsule (20 mg total) by mouth daily.     Vitamin B-12 500 MCG Subl  Place 2 tablets (1,000 mcg total) under the tongue 1 day or 1 dose.      ASK your doctor about these medications       furosemide 40 MG tablet  Commonly known as: LASIX  Take 1 tablet (40 mg total) by mouth daily.     potassium chloride SA 20 MEQ tablet  Commonly known as: K-DUR,KLOR-CON  Take 1 tablet (20 mEq total) by mouth 2 (two) times daily.         Duration of Discharge Encounter: Greater than 30 minutes including physician time.  Signed, [name] PA-C 02/05/2015 9:25 AM        F/u falls - needs a w/c; using a walker  F/u memory loss  F/u dizziness, memory loss - she had a head CT 1-2 mo ago. C/o GERD  She ran out of her DM and HTN meds  The patient presents for a follow-up of  chronic hypertension, chronic dyslipidemia, type 2 diabetes, CAD, CHF, depression    F/u vit def - not sure if she is taking them right    Wt Readings from Last 3 Encounters:  02/13/15 173 lb (78.472 kg)  02/11/15 172 lb (78.019 kg)  02/05/15 174 lb 3.2 oz (79.017 kg)   BP Readings from Last 3 Encounters:  02/13/15 130/68  02/11/15 156/59  02/05/15 148/48     BP 130/68 mmHg  Pulse 60  Wt 173 lb (78.472 kg)  SpO2 98%     Review of Systems  Constitutional: Positive for fatigue. Negative for chills,  activity change, appetite change and unexpected weight change.  HENT: Negative for congestion, mouth sores and sinus pressure.   Eyes: Negative for visual disturbance.  Respiratory: Positive for shortness of breath (mild). Negative for cough and chest tightness.   Cardiovascular: Negative for palpitations and leg swelling.  Gastrointestinal: Negative for nausea and vomiting.  Genitourinary: Negative for frequency, difficulty urinating and vaginal pain.  Musculoskeletal: Negative for myalgias and gait problem.  Skin: Negative for pallor and rash.  Neurological: Negative for dizziness and tremors.  Psychiatric/Behavioral: Negative for confusion and sleep disturbance. The patient is not hyperactive.        Objective:   Physical Exam  Constitutional: She appears well-developed. No distress.  NAD Obese  HENT:  Head: Normocephalic.  Right Ear: External ear normal.  Left Ear: External ear normal.  Nose: Nose normal.  Mouth/Throat: Oropharynx is clear and moist.  Eyes: Conjunctivae are normal. Pupils are equal, round, and reactive to light. Right eye exhibits no discharge. Left eye exhibits no discharge.  Neck: Normal range of motion. Neck supple. No JVD present. No tracheal deviation present. No thyromegaly present.  Cardiovascular: Normal rate, regular rhythm and normal heart sounds.   Pulmonary/Chest: No stridor. No respiratory distress. She has no wheezes.  Abdominal: Soft. Bowel sounds are normal. She exhibits no distension and no mass. There is no  tenderness. There is no rebound and no guarding.  Musculoskeletal: She exhibits no edema or tenderness.  Lymphadenopathy:    She has no cervical adenopathy.  Neurological: She displays normal reflexes. No cranial nerve deficit. She exhibits normal muscle tone. Coordination normal.  Skin: No rash noted. No erythema.  Psychiatric: She has a normal mood and affect. Her behavior is normal. Judgment and thought content normal.  Weak LE muscles LS is tender diffusely, mostly over paraspinal muscles. No rash, no bruising Grip (5-) B A/c 100-7=?  Lab Results  Component Value Date   WBC 6.1 02/04/2015   HGB 12.3 02/04/2015   HCT 36.7 02/04/2015   PLT 218 02/04/2015   GLUCOSE 145* 02/04/2015   CHOL 165 09/05/2014   TRIG 93 09/05/2014   HDL 70 09/05/2014   LDLDIRECT 180.7 12/09/2011   LDLCALC 76 09/05/2014   ALT 13* 01/30/2015   AST 16 01/30/2015   NA 135 02/04/2015   K 4.6 02/04/2015   CL 98* 02/04/2015   CREATININE 1.60* 02/04/2015   BUN 29* 02/04/2015   CO2 27 02/04/2015   TSH 1.56 10/12/2014   INR 0.97 06/08/2014   HGBA1C 7.1* 10/12/2014    02/11/15 LS X ray IMPRESSION: 1. No acute traumatic injury within the lumbar spine. 2. Advanced multilevel degenerative disc disease and facet arthropathy within the lumbar spine.   Electronically Signed  By: Jeannine Boga M.D.  On: 02/11/2015 04:35     Assessment & Plan:

## 2015-02-13 NOTE — Assessment & Plan Note (Signed)
6/16 - ?contusion Norco prn. Robaxin is too $$$  02/11/15 LS X ray IMPRESSION: 1. No acute traumatic injury within the lumbar spine. 2. Advanced multilevel degenerative disc disease and facet arthropathy within the lumbar spine.   Electronically Signed  By: Jeannine Boga M.D.  On: 02/11/2015 04:35  MRI if not better

## 2015-02-13 NOTE — Assessment & Plan Note (Signed)
Chronic. States BP is nl at home  On Coreg, Avapro, Lasix

## 2015-02-13 NOTE — Assessment & Plan Note (Signed)
   On Coreg, Avapro, Lasix

## 2015-02-15 ENCOUNTER — Ambulatory Visit: Payer: Commercial Managed Care - HMO | Admitting: Cardiology

## 2015-02-20 ENCOUNTER — Telehealth: Payer: Self-pay | Admitting: Internal Medicine

## 2015-02-20 NOTE — Telephone Encounter (Signed)
[name] from [facility] wanted to let you know that patient can't take lipitor because it is causing weakness in her legs and she has stopped taking it. Patient will bring in all her meds to her next appointment

## 2015-02-20 NOTE — Telephone Encounter (Signed)
Noted. Thx.

## 2015-02-21 NOTE — Patient Outreach (Signed)
[facility]) Care Management  02/21/2015  Teffany Blaszczyk [facility] [birth year] [phone]   Referral received from [place] and assigned to Sherrin Daisy, RN and [name], PharmD for patient outreach.   Sula Fetterly L. Elbert Ewings [facility] Care Management Assistant [phone]

## 2015-02-22 ENCOUNTER — Other Ambulatory Visit: Payer: Self-pay

## 2015-02-22 NOTE — Patient Outreach (Signed)
I called Ms. Bjorkman to set up a home visit appointment.  I had to leave a HIPPA compliant message for her to call me back.  I will call again tomorrow if I do not hear from her.   [name], PharmD, [place] [phone]

## 2015-02-28 ENCOUNTER — Ambulatory Visit (INDEPENDENT_AMBULATORY_CARE_PROVIDER_SITE_OTHER): Payer: Commercial Managed Care - HMO | Admitting: Internal Medicine

## 2015-02-28 ENCOUNTER — Encounter: Payer: Self-pay | Admitting: Internal Medicine

## 2015-02-28 VITALS — BP 118/62 | HR 72 | Wt 176.0 lb

## 2015-02-28 DIAGNOSIS — E1169 Type 2 diabetes mellitus with other specified complication: Secondary | ICD-10-CM

## 2015-02-28 DIAGNOSIS — E538 Deficiency of other specified B group vitamins: Secondary | ICD-10-CM

## 2015-02-28 DIAGNOSIS — L570 Actinic keratosis: Secondary | ICD-10-CM | POA: Diagnosis not present

## 2015-02-28 DIAGNOSIS — L304 Erythema intertrigo: Secondary | ICD-10-CM

## 2015-02-28 DIAGNOSIS — Z91199 Patient's noncompliance with other medical treatment and regimen due to unspecified reason: Secondary | ICD-10-CM

## 2015-02-28 DIAGNOSIS — I255 Ischemic cardiomyopathy: Secondary | ICD-10-CM

## 2015-02-28 DIAGNOSIS — Z9111 Patient's noncompliance with dietary regimen: Secondary | ICD-10-CM

## 2015-02-28 DIAGNOSIS — Z9114 Patient's other noncompliance with medication regimen: Secondary | ICD-10-CM

## 2015-02-28 DIAGNOSIS — R296 Repeated falls: Secondary | ICD-10-CM

## 2015-02-28 MED ORDER — KETOCONAZOLE 2 % EX CREA: 1.0000 "application " | TOPICAL_CREAM | Freq: Every day | CUTANEOUS | Status: DC

## 2015-02-28 NOTE — Assessment & Plan Note (Signed)
Ketocon cream Rx

## 2015-02-28 NOTE — Assessment & Plan Note (Signed)
Discussed.

## 2015-02-28 NOTE — Progress Notes (Signed)
Pre visit review using our clinic review tool, if applicable. No additional management support is needed unless otherwise documented below in the visit note. 

## 2015-02-28 NOTE — Progress Notes (Signed)
Subjective:  Patient ID: Mary Cook, female    DOB: 1926-04-22  Age: 81 y.o. MRN: [id number]  CC: No chief complaint on file.   HPI [employer] presents for CHF, CAD, dementia, CRF - felling better. C/o aches and pains, legs are weak  Outpatient Prescriptions Prior to Visit  Medication Sig Dispense Refill  . ACCU-CHEK AVIVA PLUS test strip     . ACCU-CHEK SOFTCLIX LANCETS lancets     . acetaminophen (TYLENOL) 325 MG tablet Take 2 tablets (650 mg total) by mouth every 4 (four) hours as needed for headache or mild pain.    . Alcohol Swabs (B-D SINGLE USE SWABS REGULAR) PADS     . alum & mag hydroxide-simeth (MAALOX/MYLANTA) 200-200-20 MG/5ML suspension Take 30 mLs by mouth every 2 (two) hours as needed for indigestion. 355 mL 0  . aspirin 81 MG EC tablet Take 81 mg by mouth daily.      Marland Kitchen atorvastatin (LIPITOR) 40 MG tablet Take 1 tablet (40 mg total) by mouth daily at 6 PM. 30 tablet 2  . carvedilol (COREG) 3.125 MG tablet Take 1 tablet (3.125 mg total) by mouth 2 (two) times daily. 180 tablet 3  . citalopram (CELEXA) 20 MG tablet Take 1 tablet (20 mg total) by mouth daily. 90 tablet 3  . Cyanocobalamin (VITAMIN B-12) 500 MCG SUBL Place 2 tablets (1,000 mcg total) under the tongue 1 day or 1 dose. (Patient taking differently: Place 2 tablets under the tongue daily. ) 100 tablet 3  . fluticasone (FLONASE) 50 MCG/ACT nasal spray Place 1 spray into both nostrils daily as needed for allergies or rhinitis.    . furosemide (LASIX) 40 MG tablet Take 1 tablet (40 mg total) by mouth daily. 90 tablet 3  . HYDROcodone-acetaminophen (NORCO/VICODIN) 5-325 MG per tablet Take 0.5-1 tablets by mouth every 6 (six) hours as needed for moderate pain or severe pain. 60 tablet 0  . irbesartan (AVAPRO) 150 MG tablet Take 1/2 tablet daily (Patient taking differently: Take 75 mg by mouth daily. ) 90 tablet 3  . isosorbide mononitrate (IMDUR) 60 MG 24 hr tablet Take 1 tablet (60 mg total) by mouth  daily. 90 tablet 3  . lidocaine (LIDODERM) 5 % Place 1 patch onto the skin once. Remove & Discard patch within 12 hours or as directed by MD 30 patch 0  . metFORMIN (GLUCOPHAGE) 500 MG tablet Take 1 tablet by mouth daily.    Marland Kitchen omeprazole (PRILOSEC) 20 MG capsule Take 1 capsule (20 mg total) by mouth daily. 30 capsule 3  . potassium chloride SA (K-DUR,KLOR-CON) 20 MEQ tablet Take 1 tablet by mouth daily.     No facility-administered medications prior to visit.    ROS Review of Systems  Constitutional: Positive for fatigue. Negative for chills, activity change, appetite change and unexpected weight change.  HENT: Negative for congestion, mouth sores and sinus pressure.   Eyes: Negative for visual disturbance.  Respiratory: Positive for shortness of breath. Negative for cough and chest tightness.   Cardiovascular: Negative for chest pain and palpitations.  Gastrointestinal: Negative for nausea and abdominal pain.  Genitourinary: Negative for frequency, difficulty urinating and vaginal pain.  Musculoskeletal: Negative for back pain, gait problem and neck pain.  Skin: Negative for pallor and rash.  Neurological: Negative for dizziness, tremors, weakness, numbness and headaches.  Psychiatric/Behavioral: Negative for suicidal ideas, behavioral problems, confusion and sleep disturbance. The patient is nervous/anxious.     Objective:  BP 118/62 mmHg  Pulse 72  Wt 176 lb (79.833 kg)  SpO2 94%  BP Readings from Last 3 Encounters:  02/28/15 118/62  02/13/15 130/68  02/11/15 156/59    Wt Readings from Last 3 Encounters:  02/28/15 176 lb (79.833 kg)  02/13/15 173 lb (78.472 kg)  02/11/15 172 lb (78.019 kg)    Physical Exam  Constitutional: She appears well-developed. No distress.  Obese  HENT:  Head: Normocephalic.  Right Ear: External ear normal.  Left Ear: External ear normal.  Nose: Nose normal.  Mouth/Throat: Oropharynx is clear and moist.  Eyes: Conjunctivae are normal.  Pupils are equal, round, and reactive to light. Right eye exhibits no discharge. Left eye exhibits no discharge.  Neck: Normal range of motion. Neck supple. No JVD present. No tracheal deviation present. No thyromegaly present.  Cardiovascular: Normal rate, regular rhythm and normal heart sounds.   Pulmonary/Chest: No stridor. No respiratory distress. She has no wheezes.  Abdominal: Soft. Bowel sounds are normal. She exhibits no distension and no mass. There is no tenderness. There is no rebound and no guarding.  Musculoskeletal: She exhibits no edema or tenderness.  Lymphadenopathy:    She has no cervical adenopathy.  Neurological: She displays normal reflexes. No cranial nerve deficit. She exhibits normal muscle tone. Coordination abnormal.  Skin: No rash noted. No erythema.  Psychiatric: She has a normal mood and affect. Her behavior is normal. Judgment and thought content normal.  AKs Intertrigo under breasts  Lab Results  Component Value Date   WBC 6.1 02/04/2015   HGB 12.3 02/04/2015   HCT 36.7 02/04/2015   PLT 218 02/04/2015   GLUCOSE 145* 02/04/2015   CHOL 165 09/05/2014   TRIG 93 09/05/2014   HDL 70 09/05/2014   LDLDIRECT 180.7 12/09/2011   LDLCALC 76 09/05/2014   ALT 13* 01/30/2015   AST 16 01/30/2015   NA 135 02/04/2015   K 4.6 02/04/2015   CL 98* 02/04/2015   CREATININE 1.60* 02/04/2015   BUN 29* 02/04/2015   CO2 27 02/04/2015   TSH 1.56 10/12/2014   INR 0.97 06/08/2014   HGBA1C 7.1* 10/12/2014    Procedure Note :     Procedure : Cryosurgery   Indication:  Actinic keratosis(es)   Risks including unsuccessful procedure , bleeding, infection, bruising, scar, a need for a repeat  procedure and others were explained to the patient in detail as well as the benefits. Informed consent was obtained verbally.    2 lesion(s)  on face   was/were treated with liquid nitrogen on a Q-tip in a usual fasion . Band-Aid was applied and antibiotic ointment was given for a later  use.   Tolerated well. Complications none.   Postprocedure instructions :     Keep the wounds clean. You can wash them with liquid soap and water. Pat dry with gauze or a Kleenex tissue  Before applying antibiotic ointment and a Band-Aid.   You need to report immediately  if  any signs of infection develop.     Dg Ribs Unilateral W/chest Right  02/11/2015   CLINICAL DATA:  Back pain.  Dropped by a nurse 2 days ago.  EXAM: RIGHT RIBS AND CHEST - 3+ VIEW  COMPARISON:  Chest 02/02/2015  FINDINGS: Mild cardiac enlargement. Normal pulmonary vascularity. No focal airspace disease or consolidation in the lungs. No blunting of costophrenic angles. No pneumothorax. Calcification of the aorta.  Right ribs appear intact. No acute displaced rib fractures identified. No focal bone lesions.  IMPRESSION: Mild cardiac  enlargement. No evidence of active pulmonary disease. Negative right ribs.   Electronically Signed   By: Lucienne Capers M.D.   On: 02/11/2015 04:32   Dg Thoracic Spine 2 View  02/11/2015   CLINICAL DATA:  Back pain.  Dropped by a nurse 2 days ago.  EXAM: THORACIC SPINE - 2 VIEW  COMPARISON:  02/01/2015  FINDINGS: Degenerative changes throughout the thoracic spine with narrowed interspaces and associated endplate hypertrophic changes. Diffuse bone demineralization. Normal alignment. No vertebral compression deformities. Bone cortex and trabecular architecture appear intact. No significant paraspinal soft tissue swelling. Calcification in the mitral valve annulus. Calcification of the aorta. Vascular calcifications in the upper abdomen.  IMPRESSION: Degenerative changes in the thoracic spine. Normal alignment. No acute displaced fractures.   Electronically Signed   By: Lucienne Capers M.D.   On: 02/11/2015 04:26   Dg Lumbar Spine Complete  02/11/2015   CLINICAL DATA:  Initial valuation for recent trauma, fall, back pain.  EXAM: LUMBAR SPINE - COMPLETE 4+ VIEW  COMPARISON:  None.  FINDINGS:  Vertebral bodies are normally aligned with preservation of the normal lumbar lordosis. Vertebral body heights are maintained. No fracture or listhesis. Sacrum grossly intact.  Multilevel advanced degenerative disc disease and facet arthropathy present. Diffuse osteopenia noted. Partially visualized pelvis intact.  Prominent atheromatous calcifications present within the intra-abdominal aorta and splenic artery.  IMPRESSION: 1. No acute traumatic injury within the lumbar spine. 2. Advanced multilevel degenerative disc disease and facet arthropathy within the lumbar spine.   Electronically Signed   By: Jeannine Boga M.D.   On: 02/11/2015 04:35    Assessment & Plan:   There are no diagnoses linked to this encounter. I am having Ms. Altieri maintain her aspirin, fluticasone, isosorbide mononitrate, carvedilol, B-D SINGLE USE SWABS REGULAR, ACCU-CHEK AVIVA PLUS, ACCU-CHEK SOFTCLIX LANCETS, furosemide, omeprazole, atorvastatin, Vitamin B-12, citalopram, irbesartan, acetaminophen, alum & mag hydroxide-simeth, lidocaine, metFORMIN, potassium chloride SA, and HYDROcodone-acetaminophen.  No orders of the defined types were placed in this encounter.     Follow-up: No Follow-up on file.  Walker Kehr, MD

## 2015-02-28 NOTE — Assessment & Plan Note (Signed)
Chronic Off Metformin Risks associated with diet and treatment noncompliance were discussed. Compliance was encouraged.

## 2015-02-28 NOTE — Patient Instructions (Signed)
   Postprocedure instructions :     Keep the wounds clean. You can wash them with liquid soap and water. Pat dry with gauze or a Kleenex tissue  Before applying antibiotic ointment and a Band-Aid.   You need to report immediately  if  any signs of infection develop.    

## 2015-02-28 NOTE — Assessment & Plan Note (Signed)
Risks associated with treatment noncompliance were discussed. Compliance was encouraged. Coreg, Avapro, Lasix

## 2015-02-28 NOTE — Assessment & Plan Note (Signed)
Better  

## 2015-02-28 NOTE — Assessment & Plan Note (Signed)
See cryo 

## 2015-02-28 NOTE — Assessment & Plan Note (Signed)
On [name] 

## 2015-03-02 ENCOUNTER — Other Ambulatory Visit: Payer: Self-pay

## 2015-03-02 NOTE — Patient Outreach (Signed)
I called Mary Cook to set up a home visit to review her medications. This is my second outreach attempt.  I had to leave a HIPPA compliant message for her to call me back.  If I do not hear from her by next week, I will reach out again.   [name], PharmD, [place] [phone]

## 2015-03-03 ENCOUNTER — Other Ambulatory Visit: Payer: Self-pay

## 2015-03-03 NOTE — Patient Outreach (Signed)
I received a phone call from Mr. Obriant in regards to the message I left for his wife.  I was able to set up a home visit on Thursday, March 09, 2015 at 1:30 pm to review her medications.    [name], PharmD, [place] [phone]

## 2015-03-06 ENCOUNTER — Other Ambulatory Visit: Payer: Self-pay | Admitting: *Deleted

## 2015-03-06 NOTE — Patient Outreach (Signed)
[place] [facility]) Care Management  03/06/2015  Zacari Radick [facility] May 17, 1926 [id number]   Referral from [place] Management: Telephone call to patient; left message on voice mail requesting call back.  Plan: will follow up with repeat call. Sherrin Daisy, RN BSN [place] Management Coordinator [facility] Care Management  [phone]

## 2015-03-07 ENCOUNTER — Ambulatory Visit: Payer: Commercial Managed Care - HMO | Admitting: Internal Medicine

## 2015-03-09 ENCOUNTER — Other Ambulatory Visit: Payer: Commercial Managed Care - HMO

## 2015-03-10 ENCOUNTER — Other Ambulatory Visit: Payer: Self-pay | Admitting: Internal Medicine

## 2015-03-13 ENCOUNTER — Other Ambulatory Visit: Payer: Self-pay | Admitting: *Deleted

## 2015-03-13 DIAGNOSIS — I5042 Chronic combined systolic (congestive) and diastolic (congestive) heart failure: Secondary | ICD-10-CM

## 2015-03-13 NOTE — Patient Outreach (Signed)
[place] [facility]) Care Management  03/13/2015  [place] 1925-10-05 [id number]  Telephone call x 3;  Patient advised of reason for call & [facility] care management services.  Patient voices that her spouse provides assistance with her activities as needed during the day. States he provides her transportation to doctors'  appointments and assists her with trying to manage her medications. Uses [facility] mail order pharmacy for chronic medications and local pharmacy for short term prescriptions. Voices that she & husband need assistance with managing her medications.  Patient does states that she has memory challenges.   States she is currently dealing with conditions of heart failure, diabetes and back pain. States she currently is using cane to get around. States she has scales but is not weighing daily.  Patient not able to voice symptoms of heart failure that require calling her doctor.   Per screening assessment; recommendations for [facility] community care coordinator for health assessment and disease management for hear failure and diabetes. Patient is a falls risk; has knowledge deficit regarding managing heart failure and medications.  . Case has already been referred to [facility] pharmacist.  Patient agrees to [facility] care management services.  Plan: Will refer to [place] care coordinator; telephonic signing off.   Sherrin Daisy, RN BSN [place] Management Coordinator [facility] Care Management  [phone]

## 2015-03-13 NOTE — Patient Outreach (Signed)
I went to Mary Cook's house for the initial pharmacy home visit.  I knocked multiple times on her door and called her phone number.  There were no answers to both.  I will follow up next week to try to reschedule.   [name], PharmD, [place] [phone]

## 2015-03-13 NOTE — Patient Outreach (Signed)
[place] [facility]) Care Management  03/13/2015  Mary Cook [facility] [birth year] [id number]   Request from Sherrin Daisy, RN to assign Community RN, Mary Amor, RN assigned.  Mary Cook. [place], [place] Management [place] Assistant Phone: [phone] Fax: [phone]

## 2015-03-14 ENCOUNTER — Other Ambulatory Visit: Payer: Self-pay | Admitting: *Deleted

## 2015-03-14 ENCOUNTER — Other Ambulatory Visit: Payer: Self-pay | Admitting: Internal Medicine

## 2015-03-14 NOTE — Patient Outreach (Signed)
Call to patient home in regard to referral for [facility] CM community services. Spoke with both patient and her spouse, Mary Cook. Set up appointment for initial outreach visit for 03/15/15 They agree to meet with RNCM, they took [facility] contact and will let RNCM know if anything changes and they cannot meet tomorrow as scheduled. Also, they agree for [facility] to let Pharmacist know about outreach visit if she would like to coordinate her visit to coincide it will be fine, if it cannot be worked out they agree to separate visit from [place] to let [name], [facility] pharmacist know of planned visit. Plan for outreach visit 03/15/15 Mary Cook. Laymond Purser, RN, BSN, [place] [phone]

## 2015-03-15 ENCOUNTER — Other Ambulatory Visit: Payer: Commercial Managed Care - HMO

## 2015-03-15 ENCOUNTER — Encounter: Payer: Self-pay | Admitting: *Deleted

## 2015-03-15 ENCOUNTER — Ambulatory Visit: Payer: Commercial Managed Care - HMO | Admitting: *Deleted

## 2015-03-15 NOTE — Patient Outreach (Addendum)
I went to [name]'s house to complete my home visit.  She was not at home.  I was meeting the patient with Burgess Amor, RN, nurse care manager.  She left her card for the patient to call her back.  Unfortunately we could not leave a message on her voice mail due to the number being disconnected.  I will reach out to her next week if I do not hear back from her or [name].  This is the second time I have gone to her house and she was not home.   [name], PharmD, [place] [phone]

## 2015-03-15 NOTE — Patient Outreach (Signed)
[place] [facility]) Care Management  03/15/2015  Gita Dilger [facility] [birth year] [id number]   Arrived at patient home at 12:40, for initial [facility] outreach visit, no answer to door.  Multiple call attempts to patient number, number disconnected per message from [place] Waited with [name], [place] and Elisabeth Most, [place] resident until 1:20 pm.  As we had planned a co-visit.  No one arrived, spoke briefly with across the street neighbor in case needed to call 911, She reports that she did see the patient and her spouse "leave in their small car this morning". Left a RNCM card with a brief note. Plan await return call from patient or spouse. Plan to attempt to reschedule again for next week. Royetta Crochet. Laymond Purser, RN, BSN, [place] [phone]

## 2015-03-17 ENCOUNTER — Telehealth: Payer: Self-pay | Admitting: Internal Medicine

## 2015-03-17 NOTE — Telephone Encounter (Signed)
Following through transition program.  States that patient is noncompliant with diet and medication.  Has lost contact with patient.  Did get [facility] to follow up but every time they would go for an appointment the patient would not be home.  Patient is having issues with medications, diet and back pain.  Will be closing out case today.

## 2015-03-17 NOTE — Telephone Encounter (Signed)
Fyi.Marland KitchenMarland Kitchenplease advise if i need to do anything else, thanks

## 2015-03-21 ENCOUNTER — Other Ambulatory Visit: Payer: Self-pay | Admitting: *Deleted

## 2015-03-21 NOTE — Patient Outreach (Signed)
Outreach call in regard to rescheduling initial home visit. Left RNCM contact on patient voicemail. Plan to await call back from patient or spouse. Will attempt again next week if no return call. Royetta Crochet. Laymond Purser, RN, BSN, [place] [phone]

## 2015-03-22 NOTE — Telephone Encounter (Signed)
No.Thx.

## 2015-03-28 ENCOUNTER — Other Ambulatory Visit: Payer: Self-pay

## 2015-03-28 ENCOUNTER — Other Ambulatory Visit: Payer: Self-pay | Admitting: *Deleted

## 2015-03-28 NOTE — Patient Outreach (Signed)
[facility] referral outreach. Call to patient home number. Left HIPAA compliant voicemail with RNCM contact information. Will mail out program letter and if not response will close out referral per protocol. Royetta Crochet. Laymond Purser, RN, BSN, [place] [phone]

## 2015-03-28 NOTE — Patient Outreach (Signed)
I called Mrs. Klosinski to set up an appointment to make a home visit to review her medications.  I had to leave a HIPPA compliant message for her to call me back.  This is my third attempt to reach her.  Two have been home visits which she has not been home.  If I do not hear back from her in 10 days, I will send a close out letter.   [name], PharmD, [place] [phone]

## 2015-04-03 DIAGNOSIS — K219 Gastro-esophageal reflux disease without esophagitis: Secondary | ICD-10-CM | POA: Diagnosis not present

## 2015-04-03 DIAGNOSIS — I10 Essential (primary) hypertension: Secondary | ICD-10-CM | POA: Diagnosis not present

## 2015-04-03 DIAGNOSIS — Z79899 Other long term (current) drug therapy: Secondary | ICD-10-CM | POA: Diagnosis not present

## 2015-04-03 DIAGNOSIS — R0602 Shortness of breath: Secondary | ICD-10-CM | POA: Diagnosis not present

## 2015-04-03 DIAGNOSIS — I517 Cardiomegaly: Secondary | ICD-10-CM | POA: Diagnosis not present

## 2015-04-03 DIAGNOSIS — I252 Old myocardial infarction: Secondary | ICD-10-CM | POA: Diagnosis not present

## 2015-04-03 DIAGNOSIS — E119 Type 2 diabetes mellitus without complications: Secondary | ICD-10-CM | POA: Diagnosis not present

## 2015-04-03 DIAGNOSIS — R5382 Chronic fatigue, unspecified: Secondary | ICD-10-CM | POA: Diagnosis not present

## 2015-04-07 ENCOUNTER — Encounter: Payer: Self-pay | Admitting: *Deleted

## 2015-04-07 NOTE — Patient Outreach (Signed)
[place] [facility]) Care Management  04/07/2015  Mary Cook [facility] Jan 20, 1926 [id number]   No response to outreach letter from patient or spouse. RNCM to sign off case. Mary Cook. Laymond Purser, RN, BSN, [place] [phone]

## 2015-04-10 NOTE — Patient Outreach (Signed)
[place] [facility]) Care Management  04/10/2015  [place] [birth year] [id number]   Notification from Kandis Mannan, RN to close case due to unable to contact patient for [place] Management services.  Thanks, Ronnell Freshwater. [place], [place] Assistant Phone: [phone] Fax: [phone]

## 2015-04-17 ENCOUNTER — Other Ambulatory Visit: Payer: Self-pay

## 2015-04-17 NOTE — Patient Outreach (Signed)
Due to patient not responding to letter sent by Merrily Brittle., her nurse care manager, I will close the pharmacy involvement.    [name], PharmD, [place] [phone]

## 2015-04-17 NOTE — Patient Outreach (Signed)
[place] [facility]) Care Management  04/17/2015  [place] 16-Jan-1926 [id number]   Notification received from [facility], PharmD to close case due to unsuccessful attempts to contact patient.  Rumi Kolodziej L. Estle Huguley, [place] Care Management Assistant

## 2015-04-18 ENCOUNTER — Encounter (HOSPITAL_COMMUNITY): Payer: Self-pay | Admitting: *Deleted

## 2015-04-18 ENCOUNTER — Emergency Department (HOSPITAL_COMMUNITY)
Admission: EM | Admit: 2015-04-18 | Discharge: 2015-04-18 | Disposition: A | Discharge status: Home / Self Care | Payer: Commercial Managed Care - HMO | Attending: Emergency Medicine | Admitting: Emergency Medicine

## 2015-04-18 ENCOUNTER — Emergency Department (HOSPITAL_COMMUNITY): Payer: Commercial Managed Care - HMO

## 2015-04-18 DIAGNOSIS — Z86010 Personal history of colonic polyps: Secondary | ICD-10-CM | POA: Insufficient documentation

## 2015-04-18 DIAGNOSIS — Z79899 Other long term (current) drug therapy: Secondary | ICD-10-CM | POA: Diagnosis not present

## 2015-04-18 DIAGNOSIS — I509 Heart failure, unspecified: Secondary | ICD-10-CM | POA: Diagnosis not present

## 2015-04-18 DIAGNOSIS — Z8673 Personal history of transient ischemic attack (TIA), and cerebral infarction without residual deficits: Secondary | ICD-10-CM | POA: Diagnosis not present

## 2015-04-18 DIAGNOSIS — I252 Old myocardial infarction: Secondary | ICD-10-CM | POA: Insufficient documentation

## 2015-04-18 DIAGNOSIS — G4730 Sleep apnea, unspecified: Secondary | ICD-10-CM | POA: Insufficient documentation

## 2015-04-18 DIAGNOSIS — Z9981 Dependence on supplemental oxygen: Secondary | ICD-10-CM | POA: Insufficient documentation

## 2015-04-18 DIAGNOSIS — E785 Hyperlipidemia, unspecified: Secondary | ICD-10-CM | POA: Diagnosis not present

## 2015-04-18 DIAGNOSIS — E119 Type 2 diabetes mellitus without complications: Secondary | ICD-10-CM | POA: Diagnosis not present

## 2015-04-18 DIAGNOSIS — Z7982 Long term (current) use of aspirin: Secondary | ICD-10-CM | POA: Diagnosis not present

## 2015-04-18 DIAGNOSIS — I10 Essential (primary) hypertension: Secondary | ICD-10-CM | POA: Diagnosis not present

## 2015-04-18 DIAGNOSIS — R0602 Shortness of breath: Secondary | ICD-10-CM | POA: Diagnosis not present

## 2015-04-18 LAB — I-STAT TROPONIN, ED: TROPONIN I, POC: 0.02 ng/mL (ref 0.00–0.08)

## 2015-04-18 LAB — BRAIN NATRIURETIC PEPTIDE: B Natriuretic Peptide: 529.8 pg/mL — ABNORMAL HIGH (ref 0.0–100.0)

## 2015-04-18 LAB — BASIC METABOLIC PANEL
Anion gap: 9 (ref 5–15)
BUN: 12 mg/dL (ref 6–20)
CHLORIDE: 105 mmol/L (ref 101–111)
CO2: 26 mmol/L (ref 22–32)
Calcium: 9.6 mg/dL (ref 8.9–10.3)
Creatinine, Ser: 1.18 mg/dL — ABNORMAL HIGH (ref 0.44–1.00)
GFR calc non Af Amer: 40 mL/min — ABNORMAL LOW (ref >60)
GFR, EST AFRICAN AMERICAN: 47 mL/min — AB (ref >60)
Glucose, Bld: 153 mg/dL — ABNORMAL HIGH (ref 65–99)
Potassium: 3.8 mmol/L (ref 3.5–5.1)
SODIUM: 140 mmol/L (ref 135–145)

## 2015-04-18 LAB — CBC
HCT: 35.8 % — ABNORMAL LOW (ref 36.0–46.0)
Hemoglobin: 11.8 g/dL — ABNORMAL LOW (ref 12.0–15.0)
MCH: 30.1 pg (ref 26.0–34.0)
MCHC: 33 g/dL (ref 30.0–36.0)
MCV: 91.3 fL (ref 78.0–100.0)
Platelets: 185 10*3/uL (ref 150–400)
RBC: 3.92 MIL/uL (ref 3.87–5.11)
RDW: 12.5 % (ref 11.5–15.5)
WBC: 5.1 10*3/uL (ref 4.0–10.5)

## 2015-04-18 LAB — TROPONIN I: Troponin I: <0.03 ng/mL (ref <0.031)

## 2015-04-18 MED ORDER — NITROGLYCERIN 2 % TD OINT
1.0000 [in_us] | TOPICAL_OINTMENT | Freq: Once | TRANSDERMAL | Status: AC
  Administered 2015-04-18: 1 [in_us] via TOPICAL
  Filled 2015-04-18: qty 1

## 2015-04-18 MED ORDER — FUROSEMIDE 10 MG/ML IJ SOLN
40.0000 mg | Freq: Once | INTRAMUSCULAR | Status: AC
  Administered 2015-04-18: 40 mg via INTRAVENOUS
  Filled 2015-04-18: qty 4

## 2015-04-18 NOTE — ED Provider Notes (Signed)
CSN: [id number]     Arrival date & time 04/18/15  1013 History   First MD Initiated Contact with Patient 04/18/15 1158     Chief Complaint  Patient presents with  . Congestive Heart Failure  . Shortness of Breath     (Consider location/radiation/quality/duration/timing/severity/associated sxs/prior Treatment) The history is provided by the patient and the spouse.     Pt wwith hx HTN, DM, CAD s/p MI, CKDIII, CHF (EF 30-35%), OSA, dementia p/w worsening SOB that began overnight.  Husband notes pt felt that she could not breathe overnight, sometimes gets up and walks around and that improves the symptoms.  Pt admits she is not very good about taking her medications and does not follow the diet that has been recommended to her.  States she eats out a lot. Has had mild dry cough.  Denies fevers, chills, myalgias, chest pain, abdominal pain, and lower extremity edema, lightheadedness/dizziness.    Cardiologist Dr Claiborne Billings PCP  Dr Alain Marion   Past Medical History  Diagnosis Date  . Hypertension   . Osteoarthritis   . Osteopenia   . Diverticulosis of colon   . Diabetes mellitus     type II  . CAD (coronary artery disease)   . MI (myocardial infarction) 05/10/2010    Dr Claiborne Billings, Rx'd with CFX  DES  . HYPERLIPIDEMIA 01/07/2008  . GIB (gastrointestinal bleeding) 2002    Diverticular  . Skin disorder   . Visual disorder   . Thrombus   . Sleep apnea     cpap therapy  . Shortness of breath dyspnea   . Frequent urination   . Cerebral aneurysm   . Stroke 2002   Past Surgical History  Procedure Laterality Date  . Abdominal hysterectomy  1980    no bso  . Coronary angioplasty with stent placement  05/11/2010    stent CFX  . Cardiac catheterization  07/2011    LAD OK, D1 80%, CFX 30% w/ patent stent, RCA 50-60%, EF 40%  . Thrombectomy    . Cardiac catheterization    . Doppler echocardiography    . Myocardial perfusion study    . Left heart catheterization with coronary angiogram N/A  08/18/2011    Procedure: LEFT HEART CATHETERIZATION WITH CORONARY ANGIOGRAM;  Surgeon: Wellington Hampshire, MD;  Location: [facility] CATH [facility];  Service: Cardiovascular;  Laterality: N/A;  . Left heart catheterization with coronary angiogram N/A 06/08/2014    Procedure: LEFT HEART CATHETERIZATION WITH CORONARY ANGIOGRAM;  Surgeon: Troy Sine, MD;  Location: [facility] C[facility];  Service: Cardiovascular;  Laterality: N/A;  . Tonsillectomy    . Appendectomy     Family History  Problem Relation Age of Onset  . Hypertension Other   . CAD      father   Social History  Substance Use Topics  . Smoking status: Never Smoker   . Smokeless tobacco: Never Used  . Alcohol Use: No   OB History    No data available     Review of Systems  All other systems reviewed and are negative.     Allergies  Clopidogrel bisulfate; Lipitor; and Lisinopril  Home Medications   Prior to Admission medications   Medication Sig Start Date End Date Taking? Authorizing Provider  ACCU-CHEK AVIVA PLUS test strip  10/21/14   Historical Provider, MD  ACCU-CHEK SOFTCLIX LANCETS lancets  10/21/14   Historical Provider, MD  acetaminophen (TYLENOL) 325 MG tablet Take 2 tablets (650 mg total) by mouth every 4 (four) hours  as needed for headache or mild pain. 02/05/15   Erlene Quan, PA-C  Alcohol Swabs (B-D SINGLE USE SWABS REGULAR) PADS  10/21/14   Historical Provider, MD  alum & mag hydroxide-simeth (MAALOX/MYLANTA) 200-200-20 MG/5ML suspension Take 30 mLs by mouth every 2 (two) hours as needed for indigestion. 02/05/15   Erlene Quan, PA-C  aspirin 81 MG EC tablet Take 81 mg by mouth daily.      Historical Provider, MD  atorvastatin (LIPITOR) 40 MG tablet TAKE 1 TABLET EVERY DAY  AT  6:00  PM 03/14/15   Aleksei Plotnikov V, MD  carvedilol (COREG) 3.125 MG tablet Take 1 tablet (3.125 mg total) by mouth 2 (two) times daily. 09/05/14   Troy Sine, MD  citalopram (CELEXA) 20 MG tablet Take 1 tablet (20 mg total) by mouth daily.  12/02/14   Aleksei Plotnikov V, MD  Cyanocobalamin (VITAMIN B-12) 500 MCG SUBL Place 2 tablets (1,000 mcg total) under the tongue 1 day or 1 dose. Patient taking differently: Place 2 tablets under the tongue daily.  12/02/14   Aleksei Plotnikov V, MD  fluticasone (FLONASE) 50 MCG/ACT nasal spray Place 1 spray into both nostrils daily as needed for allergies or rhinitis.    Historical Provider, MD  furosemide (LASIX) 40 MG tablet Take 1 tablet (40 mg total) by mouth daily. 12/02/14   Aleksei Plotnikov V, MD  HYDROcodone-acetaminophen (NORCO/VICODIN) 5-325 MG per tablet Take 0.5-1 tablets by mouth every 6 (six) hours as needed for moderate pain or severe pain. 02/13/15   Aleksei Plotnikov V, MD  irbesartan (AVAPRO) 150 MG tablet Take 1/2 tablet daily Patient taking differently: Take 75 mg by mouth daily.  12/04/14   Aleksei Plotnikov V, MD  isosorbide mononitrate (IMDUR) 60 MG 24 hr tablet Take 1 tablet (60 mg total) by mouth daily. 07/05/14   Troy Sine, MD  ketoconazole (NIZORAL) 2 % cream Apply 1 application topically daily. Under breasts 02/28/15   Aleksei Plotnikov V, MD  lidocaine (LIDODERM) 5 % Place 1 patch onto the skin once. Remove & Discard patch within 12 hours or as directed by MD 02/11/15   Linton Flemings, MD  metFORMIN (GLUCOPHAGE) 500 MG tablet TAKE 1 TABLET TWICE DAILY BEFORE A MEAL 03/10/15   Aleksei Plotnikov V, MD  omeprazole (PRILOSEC) 20 MG capsule Take 1 capsule (20 mg total) by mouth daily. 12/02/14   Aleksei Plotnikov V, MD  potassium chloride SA (K-DUR,KLOR-CON) 20 MEQ tablet Take 1 tablet by mouth daily. 12/06/14   Historical Provider, MD   BP 179/67 mmHg  Pulse 60  Temp(Src) 97.7 F (36.5 C) (Oral)  Resp 11  Ht 5\' 5"  (1.651 m)  Wt 175 lb 7 oz (79.578 kg)  BMI 29.19 kg/m2  SpO2 97% Physical Exam  Constitutional: She appears well-developed and well-nourished. No distress.  HENT:  Head: Normocephalic and atraumatic.  Eyes: Conjunctivae are normal.  Neck: Neck supple.   Cardiovascular: Normal rate and regular rhythm.   Pulmonary/Chest: Effort normal and breath sounds normal. No respiratory distress. She has no wheezes. She has no rales.  Abdominal: Soft. She exhibits no distension. There is no tenderness. There is no rebound and no guarding.  Musculoskeletal: She exhibits edema (Mild edema of bilateral lower extremities through ankles).  Neurological: She is alert.  Skin: She is not diaphoretic.  Psychiatric: She has a normal mood and affect. Her behavior is normal.  Nursing note and vitals reviewed.   ED Course  Procedures (including critical care time) Labs  Review Labs Reviewed  BASIC METABOLIC PANEL - Abnormal; Notable for the following:    Glucose, Bld 153 (*)    Creatinine, Ser 1.18 (*)    GFR calc non Af Amer 40 (*)    GFR calc Af Amer 47 (*)    All other components within normal limits  CBC - Abnormal; Notable for the following:    Hemoglobin 11.8 (*)    HCT 35.8 (*)    All other components within normal limits  TROPONIN I  BRAIN NATRIURETIC PEPTIDE    Imaging Review Dg Chest 2 View  04/18/2015   CLINICAL DATA:  Shortness of breath for a few weeks, worse last night.  EXAM: CHEST  2 VIEW  COMPARISON:  04/11/1979  FINDINGS: Heart size is within normal limits. Atherosclerotic calcifications at the aortic arch. Few basilar densities could represent atelectasis, right side greater the left. No significant airspace disease or pulmonary edema. Surgical anchor in the left humeral head. No large pleural effusions. Mild degenerative changes in the spine.  IMPRESSION: Few basilar densities may represent atelectasis. No significant airspace disease.   Electronically Signed   By: Markus Daft M.D.   On: 04/18/2015 11:45   I have personally reviewed and evaluated these images and lab results as part of my medical decision-making.   EKG Interpretation   Date/Time:  Tuesday April 18 2015 11:09:19 EDT Ventricular Rate:  69 PR Interval:  188 QRS  Duration: 124 QT Interval:  434 QTC Calculation: 465 R Axis:   -23 Text Interpretation:  Sinus rhythm with occasional Premature ventricular  complexes Left ventricular hypertrophy with QRS widening Nonspecific T  wave abnormality Abnormal ECG Premature ventricular complexes Otherwise no  significant change Confirmed by Tyrone Nine MD, Quillian Quince ([id number]) on 04/18/2015  12:14:21 PM       3:37 PM Discussed pt with Dr Dayna Barker.    4:36 PM Pt reports she is starting to feel better, does not want to stay in the hospital tonight.    MDM   Final diagnoses:  Acute on chronic congestive heart failure, unspecified congestive heart failure type    Afebrile nontoxic with increased SOB overnight.  Clinically c/w CHF exacerbation.  BNP at level it was at last admission.  CXR without significant edema.  Troponinx2 negative.  Pt not hypoxic with ambulation and feeling better with medications.  She does not take her medications regularly at home and does not follow recommended low sodium diet and therefore my recommendation to her is to take her medications, no changes needed at this time.   Doubt ACS, PE, PNA in this patient.  Offered admission, further treatment to this patient and she declined, as she wanted to go home.  Discussed result, findings, treatment, and follow up  with patient.  Pt given return precautions.  Pt verbalizes understanding and agrees with plan.        Clayton Bibles, PA-C 04/19/15 [id number]  Merrily Pew, MD 04/21/15 [phone]

## 2015-04-18 NOTE — ED Notes (Signed)
Attempted IV x2. Unable to access.  

## 2015-04-18 NOTE — Discharge Instructions (Signed)
Read the information below.  You may return to the Emergency Department at any time for worsening condition or any new symptoms that concern you.  Please follow all dietary restrictions your doctors have recommended and take your medications as directed.  If you develop chest pain, worsening shortness of breath, fever, you pass out, or become weak or dizzy, return to the ER for a recheck.      Heart Failure Heart failure is a condition in which the heart has trouble pumping blood. This means your heart does not pump blood efficiently for your body to work well. In some cases of heart failure, fluid may back up into your lungs or you may have swelling (edema) in your lower legs. Heart failure is usually a long-term (chronic) condition. It is important for you to take good care of yourself and follow your health care provider's treatment plan. CAUSES  Some health conditions can cause heart failure. Those health conditions include:  High blood pressure (hypertension). Hypertension causes the heart muscle to work harder than normal. When pressure in the blood vessels is high, the heart needs to pump (contract) with more force in order to circulate blood throughout the body. High blood pressure eventually causes the heart to become stiff and weak.  Coronary artery disease (CAD). CAD is the buildup of cholesterol and fat (plaque) in the arteries of the heart. The blockage in the arteries deprives the heart muscle of oxygen and blood. This can cause chest pain and may lead to a heart attack. High blood pressure can also contribute to CAD.  Heart attack (myocardial infarction). A heart attack occurs when one or more arteries in the heart become blocked. The loss of oxygen damages the muscle tissue of the heart. When this happens, part of the heart muscle dies. The injured tissue does not contract as well and weakens the heart's ability to pump blood.  Abnormal heart valves. When the heart valves do not open  and close properly, it can cause heart failure. This makes the heart muscle pump harder to keep the blood flowing.  Heart muscle disease (cardiomyopathy or myocarditis). Heart muscle disease is damage to the heart muscle from a variety of causes. These can include drug or alcohol abuse, infections, or unknown reasons. These can increase the risk of heart failure.  Lung disease. Lung disease makes the heart work harder because the lungs do not work properly. This can cause a strain on the heart, leading it to fail.  Diabetes. Diabetes increases the risk of heart failure. High blood sugar contributes to high fat (lipid) levels in the blood. Diabetes can also cause slow damage to tiny blood vessels that carry important nutrients to the heart muscle. When the heart does not get enough oxygen and food, it can cause the heart to become weak and stiff. This leads to a heart that does not contract efficiently.  Other conditions can contribute to heart failure. These include abnormal heart rhythms, thyroid problems, and low blood counts (anemia). Certain unhealthy behaviors can increase the risk of heart failure, including:  Being overweight.  Smoking or chewing tobacco.  Eating foods high in fat and cholesterol.  Abusing illicit drugs or alcohol.  Lacking physical activity. SYMPTOMS  Heart failure symptoms may vary and can be hard to detect. Symptoms may include:  Shortness of breath with activity, such as climbing stairs.  Persistent cough.  Swelling of the feet, ankles, legs, or abdomen.  Unexplained weight gain.  Difficulty breathing when lying flat (  orthopnea).  Waking from sleep because of the need to sit up and get more air.  Rapid heartbeat.  Fatigue and loss of energy.  Feeling light-headed, dizzy, or close to fainting.  Loss of appetite.  Nausea.  Increased urination during the night (nocturia). DIAGNOSIS  A diagnosis of heart failure is based on your history,  symptoms, physical examination, and diagnostic tests. Diagnostic tests for heart failure may include:  Echocardiography.  Electrocardiography.  Chest X-ray.  Blood tests.  Exercise stress test.  Cardiac angiography.  Radionuclide scans. TREATMENT  Treatment is aimed at managing the symptoms of heart failure. Medicines, behavioral changes, or surgical intervention may be necessary to treat heart failure.  Medicines to help treat heart failure may include:  Angiotensin-converting enzyme (ACE) inhibitors. This type of medicine blocks the effects of a blood protein called angiotensin-converting enzyme. ACE inhibitors relax (dilate) the blood vessels and help lower blood pressure.  Angiotensin receptor blockers (ARBs). This type of medicine blocks the actions of a blood protein called angiotensin. Angiotensin receptor blockers dilate the blood vessels and help lower blood pressure.  Water pills (diuretics). Diuretics cause the kidneys to remove salt and water from the blood. The extra fluid is removed through urination. This loss of extra fluid lowers the volume of blood the heart pumps.  Beta blockers. These prevent the heart from beating too fast and improve heart muscle strength.  Digitalis. This increases the force of the heartbeat.  Healthy behavior changes include:  Obtaining and maintaining a healthy weight.  Stopping smoking or chewing tobacco.  Eating heart-healthy foods.  Limiting or avoiding alcohol.  Stopping illicit drug use.  Physical activity as directed by your health care provider.  Surgical treatment for heart failure may include:  A procedure to open blocked arteries, repair damaged heart valves, or remove damaged heart muscle tissue.  A pacemaker to improve heart muscle function and control certain abnormal heart rhythms.  An internal cardioverter defibrillator to treat certain serious abnormal heart rhythms.  A left ventricular assist device (LVAD)  to assist the pumping ability of the heart. HOME CARE INSTRUCTIONS   Take medicines only as directed by your health care provider. Medicines are important in reducing the workload of your heart, slowing the progression of heart failure, and improving your symptoms.  Do not stop taking your medicine unless directed by your health care provider.  Do not skip any dose of medicine.  Refill your prescriptions before you run out of medicine. Your medicines are needed every day.  Engage in moderate physical activity if directed by your health care provider. Moderate physical activity can benefit some people. The elderly and people with severe heart failure should consult with a health care provider for physical activity recommendations.  Eat heart-healthy foods. Food choices should be free of trans fat and low in saturated fat, cholesterol, and salt (sodium). Healthy choices include fresh or frozen fruits and vegetables, fish, lean meats, legumes, fat-free or low-fat dairy products, and whole grain or high fiber foods. Talk to a dietitian to learn more about heart-healthy foods.  Limit sodium if directed by your health care provider. Sodium restriction may reduce symptoms of heart failure in some people. Talk to a dietitian to learn more about heart-healthy seasonings.  Use healthy cooking methods. Healthy cooking methods include roasting, grilling, broiling, baking, poaching, steaming, or stir-frying. Talk to a dietitian to learn more about healthy cooking methods.  Limit fluids if directed by your health care provider. Fluid restriction may reduce symptoms of  heart failure in some people.  Weigh yourself every day. Daily weights are important in the early recognition of excess fluid. You should weigh yourself every morning after you urinate and before you eat breakfast. Wear the same amount of clothing each time you weigh yourself. Record your daily weight. Provide your health care provider with your  weight record.  Monitor and record your blood pressure if directed by your health care provider.  Check your pulse if directed by your health care provider.  Lose weight if directed by your health care provider. Weight loss may reduce symptoms of heart failure in some people.  Stop smoking or chewing tobacco. Nicotine makes your heart work harder by causing your blood vessels to constrict. Do not use nicotine gum or patches before talking to your health care provider.  Keep all follow-up visits as directed by your health care provider. This is important.  Limit alcohol intake to no more than 1 drink per day for nonpregnant women and 2 drinks per day for men. One drink equals 12 ounces of beer, 5 ounces of wine, or 1 ounces of hard liquor. Drinking more than that is harmful to your heart. Tell your health care provider if you drink alcohol several times a week. Talk with your health care provider about whether alcohol is safe for you. If your heart has already been damaged by alcohol or you have severe heart failure, drinking alcohol should be stopped completely.  Stop illicit drug use.  Stay up-to-date with immunizations. It is especially important to prevent respiratory infections through current pneumococcal and influenza immunizations.  Manage other health conditions such as hypertension, diabetes, thyroid disease, or abnormal heart rhythms as directed by your health care provider.  Learn to manage stress.  Plan rest periods when fatigued.  Learn strategies to manage high temperatures. If the weather is extremely hot:  Avoid vigorous physical activity.  Use air conditioning or fans or seek a cooler location.  Avoid caffeine and alcohol.  Wear loose-fitting, lightweight, and light-colored clothing.  Learn strategies to manage cold temperatures. If the weather is extremely cold:  Avoid vigorous physical activity.  Layer clothes.  Wear mittens or gloves, a hat, and a scarf  when going outside.  Avoid alcohol.  Obtain ongoing education and support as needed.  Participate in or seek rehabilitation as needed to maintain or improve independence and quality of life. SEEK MEDICAL CARE IF:   Your weight increases by 03 lb/1.4 kg in 1 day or 05 lb/2.3 kg in a week.  You have increasing shortness of breath that is unusual for you.  You are unable to participate in your usual physical activities.  You tire easily.  You cough more than normal, especially with physical activity.  You have any or more swelling in areas such as your hands, feet, ankles, or abdomen.  You are unable to sleep because it is hard to breathe.  You feel like your heart is beating fast (palpitations).  You become dizzy or light-headed upon standing up. SEEK IMMEDIATE MEDICAL CARE IF:   You have difficulty breathing.  There is a change in mental status such as decreased alertness or difficulty with concentration.  You have a pain or discomfort in your chest.  You have an episode of fainting (syncope). MAKE SURE YOU:   Understand these instructions.  Will watch your condition.  Will get help right away if you are not doing well or get worse. Document Released: 08/12/2005 Document Revised: 12/27/2013 Document Reviewed: 09/11/2012  [employer]® Patient Information ©2015 [employer]. This information is not intended to replace advice given to you by your health care provider. Make sure you discuss any questions you have with your health care provider. ° °

## 2015-04-18 NOTE — ED Provider Notes (Signed)
Medical screening examination/treatment/procedure(s) were conducted as a shared visit with non-physician practitioner(s) and myself.  I personally evaluated the patient during the encounter.  A 81-year-old female here with likely acute on chronic CHF exacerbation mild in nature. Patient is very adamant that she wants to go home. As she doesn't have any evidence of a severe heart failure (JVD, lower extremity edema, respiratory distress) we will treat her symptomatically and try to get a little fluid off here a sure she doesn't have desaturations while walking and if able to get around without severe dyspnea or desaturation she can be discharged home.   EKG Interpretation   Date/Time:  Tuesday April 18 2015 11:09:19 EDT Ventricular Rate:  69 PR Interval:  188 QRS Duration: 124 QT Interval:  434 QTC Calculation: 465 R Axis:   -23 Text Interpretation:  Sinus rhythm with occasional Premature ventricular  complexes Left ventricular hypertrophy with QRS widening Nonspecific T  wave abnormality Abnormal ECG Premature ventricular complexes Otherwise no  significant change Confirmed by Tyrone Nine MD, DANIEL [phone]) on 04/18/2015  12:14:21 PM      \  Merrily Pew, MD 04/18/15 1659

## 2015-04-18 NOTE — ED Notes (Signed)
Ambulated pt on the unit pt 02 saturations remained at 97% no complaints noted at this time

## 2015-04-18 NOTE — ED Notes (Signed)
PT reports increased SHOB since Monday.

## 2015-04-26 ENCOUNTER — Ambulatory Visit: Payer: Commercial Managed Care - HMO | Admitting: Cardiology

## 2015-04-27 ENCOUNTER — Ambulatory Visit: Payer: Commercial Managed Care - HMO | Admitting: Physician Assistant

## 2015-04-28 ENCOUNTER — Emergency Department (HOSPITAL_COMMUNITY): Payer: Commercial Managed Care - HMO

## 2015-04-28 ENCOUNTER — Encounter (HOSPITAL_COMMUNITY): Payer: Self-pay | Admitting: Family Medicine

## 2015-04-28 ENCOUNTER — Emergency Department (HOSPITAL_COMMUNITY)
Admission: EM | Admit: 2015-04-28 | Discharge: 2015-04-28 | Disposition: A | Discharge status: Home / Self Care | Payer: Commercial Managed Care - HMO | Attending: Emergency Medicine | Admitting: Emergency Medicine

## 2015-04-28 DIAGNOSIS — I2510 Atherosclerotic heart disease of native coronary artery without angina pectoris: Secondary | ICD-10-CM | POA: Diagnosis not present

## 2015-04-28 DIAGNOSIS — M8448XA Pathological fracture, other site, initial encounter for fracture: Secondary | ICD-10-CM | POA: Insufficient documentation

## 2015-04-28 DIAGNOSIS — I10 Essential (primary) hypertension: Secondary | ICD-10-CM | POA: Insufficient documentation

## 2015-04-28 DIAGNOSIS — Z872 Personal history of diseases of the skin and subcutaneous tissue: Secondary | ICD-10-CM | POA: Insufficient documentation

## 2015-04-28 DIAGNOSIS — Z79899 Other long term (current) drug therapy: Secondary | ICD-10-CM | POA: Diagnosis not present

## 2015-04-28 DIAGNOSIS — Z9981 Dependence on supplemental oxygen: Secondary | ICD-10-CM | POA: Diagnosis not present

## 2015-04-28 DIAGNOSIS — M1990 Unspecified osteoarthritis, unspecified site: Secondary | ICD-10-CM | POA: Diagnosis not present

## 2015-04-28 DIAGNOSIS — Z7982 Long term (current) use of aspirin: Secondary | ICD-10-CM | POA: Diagnosis not present

## 2015-04-28 DIAGNOSIS — S22080A Wedge compression fracture of T11-T12 vertebra, initial encounter for closed fracture: Secondary | ICD-10-CM

## 2015-04-28 DIAGNOSIS — Z8673 Personal history of transient ischemic attack (TIA), and cerebral infarction without residual deficits: Secondary | ICD-10-CM | POA: Insufficient documentation

## 2015-04-28 DIAGNOSIS — M546 Pain in thoracic spine: Secondary | ICD-10-CM | POA: Diagnosis not present

## 2015-04-28 DIAGNOSIS — M545 Low back pain: Secondary | ICD-10-CM | POA: Insufficient documentation

## 2015-04-28 DIAGNOSIS — G4730 Sleep apnea, unspecified: Secondary | ICD-10-CM | POA: Diagnosis not present

## 2015-04-28 DIAGNOSIS — I252 Old myocardial infarction: Secondary | ICD-10-CM | POA: Insufficient documentation

## 2015-04-28 DIAGNOSIS — E785 Hyperlipidemia, unspecified: Secondary | ICD-10-CM | POA: Diagnosis not present

## 2015-04-28 DIAGNOSIS — Z8719 Personal history of other diseases of the digestive system: Secondary | ICD-10-CM | POA: Insufficient documentation

## 2015-04-28 LAB — URINALYSIS, ROUTINE W REFLEX MICROSCOPIC
BILIRUBIN URINE: NEGATIVE
Glucose, UA: 500 mg/dL — AB
HGB URINE DIPSTICK: NEGATIVE
KETONES UR: NEGATIVE mg/dL
Leukocytes, UA: NEGATIVE
NITRITE: NEGATIVE
PROTEIN: NEGATIVE mg/dL
SPECIFIC GRAVITY, URINE: 1.022 (ref 1.005–1.030)
UROBILINOGEN UA: 1 mg/dL (ref 0.0–1.0)
pH: 6 (ref 5.0–8.0)

## 2015-04-28 LAB — CBC WITH DIFFERENTIAL/PLATELET
BASOS PCT: 1 % (ref 0–1)
Basophils Absolute: 0 10*3/uL (ref 0.0–0.1)
EOS PCT: 3 % (ref 0–5)
Eosinophils Absolute: 0.1 10*3/uL (ref 0.0–0.7)
HCT: 35.9 % — ABNORMAL LOW (ref 36.0–46.0)
Hemoglobin: 11.8 g/dL — ABNORMAL LOW (ref 12.0–15.0)
LYMPHS ABS: 1.8 10*3/uL (ref 0.7–4.0)
Lymphocytes Relative: 33 % (ref 12–46)
MCH: 30.3 pg (ref 26.0–34.0)
MCHC: 32.9 g/dL (ref 30.0–36.0)
MCV: 92.1 fL (ref 78.0–100.0)
MONOS PCT: 8 % (ref 3–12)
Monocytes Absolute: 0.4 10*3/uL (ref 0.1–1.0)
Neutro Abs: 3 10*3/uL (ref 1.7–7.7)
Neutrophils Relative %: 55 % (ref 43–77)
Platelets: 178 10*3/uL (ref 150–400)
RBC: 3.9 MIL/uL (ref 3.87–5.11)
RDW: 12.6 % (ref 11.5–15.5)
WBC: 5.4 10*3/uL (ref 4.0–10.5)

## 2015-04-28 LAB — BASIC METABOLIC PANEL
ANION GAP: 7 (ref 5–15)
BUN: 27 mg/dL — ABNORMAL HIGH (ref 6–20)
CALCIUM: 9.2 mg/dL (ref 8.9–10.3)
CO2: 27 mmol/L (ref 22–32)
Chloride: 106 mmol/L (ref 101–111)
Creatinine, Ser: 1.51 mg/dL — ABNORMAL HIGH (ref 0.44–1.00)
GFR, EST AFRICAN AMERICAN: 35 mL/min — AB (ref >60)
GFR, EST NON AFRICAN AMERICAN: 30 mL/min — AB (ref >60)
Glucose, Bld: 170 mg/dL — ABNORMAL HIGH (ref 65–99)
Potassium: 3.5 mmol/L (ref 3.5–5.1)
SODIUM: 140 mmol/L (ref 135–145)

## 2015-04-28 LAB — TROPONIN I

## 2015-04-28 MED ORDER — ACETAMINOPHEN 500 MG PO TABS
1000.0000 mg | ORAL_TABLET | Freq: Once | ORAL | Status: AC
  Administered 2015-04-28: 1000 mg via ORAL
  Filled 2015-04-28: qty 2

## 2015-04-28 MED ORDER — HYDROCODONE-ACETAMINOPHEN 5-325 MG PO TABS: 0.5000 | ORAL_TABLET | Freq: Four times a day (QID) | ORAL | Status: DC | PRN

## 2015-04-28 MED ORDER — TRAMADOL HCL 50 MG PO TABS
50.0000 mg | ORAL_TABLET | Freq: Once | ORAL | Status: AC
  Administered 2015-04-28: 50 mg via ORAL
  Filled 2015-04-28: qty 1

## 2015-04-28 NOTE — ED Notes (Signed)
Pt reports a nurse was picking her up to weigh her but dropped her and her left side of her back hit a "potty chair." This happened about two months ago.

## 2015-04-28 NOTE — ED Provider Notes (Signed)
CSN: [id number]     Arrival date & time 04/28/15  1419 History   First MD Initiated Contact with Patient 04/28/15 1524     Chief Complaint  Patient presents with  . Back Pain     (Consider location/radiation/quality/duration/timing/severity/associated sxs/prior Treatment) Patient is a 81 y.o. female presenting with back pain.  Back Pain Location:  Thoracic spine (Left) Quality:  Aching Radiates to:  Does not radiate Pain severity:  Severe Onset quality:  Gradual Duration:  2 days Timing:  Constant Progression:  Worsening Chronicity:  New Context: falling (a few months ago, fell striking that area of her back. Pain resolved until yesterday. Now she is having similar pain without additional injury.)   Relieved by:  Being still and OTC medications (Resting) Worsened by:  Movement and twisting (Coughing, sneezing) Associated symptoms: no dysuria, no fever, no numbness, no paresthesias and no weakness   Associated symptoms comment:  Shortness of breath-she considers this chronic and states it is improved compared to a week ago.   Past Medical History  Diagnosis Date  . Hypertension   . Osteoarthritis   . Osteopenia   . Diverticulosis of colon   . Diabetes mellitus     type II  . CAD (coronary artery disease)   . MI (myocardial infarction) 05/10/2010    Dr Claiborne Billings, Rx'd with CFX  DES  . HYPERLIPIDEMIA 01/07/2008  . GIB (gastrointestinal bleeding) 2002    Diverticular  . Skin disorder   . Visual disorder   . Thrombus   . Sleep apnea     cpap therapy  . Shortness of breath dyspnea   . Frequent urination   . Cerebral aneurysm   . Stroke 2002   Past Surgical History  Procedure Laterality Date  . Abdominal hysterectomy  1980    no bso  . Coronary angioplasty with stent placement  05/11/2010    stent CFX  . Cardiac catheterization  07/2011    LAD OK, D1 80%, CFX 30% w/ patent stent, RCA 50-60%, EF 40%  . Thrombectomy    . Cardiac catheterization    . Doppler  echocardiography    . Myocardial perfusion study    . Left heart catheterization with coronary angiogram N/A 08/18/2011    Procedure: LEFT HEART CATHETERIZATION WITH CORONARY ANGIOGRAM;  Surgeon: Wellington Hampshire, MD;  Location: [facility] CATH [facility];  Service: Cardiovascular;  Laterality: N/A;  . Left heart catheterization with coronary angiogram N/A 06/08/2014    Procedure: LEFT HEART CATHETERIZATION WITH CORONARY ANGIOGRAM;  Surgeon: Troy Sine, MD;  Location: [facility] CATH LAB;  Service: Cardiovascular;  Laterality: N/A;  . Tonsillectomy    . Appendectomy     Family History  Problem Relation Age of Onset  . Hypertension Other   . CAD      father   Social History  Substance Use Topics  . Smoking status: Never Smoker   . Smokeless tobacco: Never Used  . Alcohol Use: No   OB History    No data available     Review of Systems  Constitutional: Negative for fever.  Genitourinary: Negative for dysuria.  Musculoskeletal: Positive for back pain.  Neurological: Negative for weakness, numbness and paresthesias.  All other systems reviewed and are negative.     Allergies  Clopidogrel bisulfate; Lipitor; and Lisinopril  Home Medications   Prior to Admission medications   Medication Sig Start Date End Date Taking? Authorizing Provider  ACCU-CHEK AVIVA PLUS test strip  10/21/14   Historical Provider, MD  ACCU-CHEK SOFTCLIX LANCETS lancets  10/21/14   Historical Provider, MD  acetaminophen (TYLENOL) 325 MG tablet Take 2 tablets (650 mg total) by mouth every 4 (four) hours as needed for headache or mild pain. 02/05/15   Erlene Quan, PA-C  Alcohol Swabs (B-D SINGLE USE SWABS REGULAR) PADS  10/21/14   Historical Provider, MD  alum & mag hydroxide-simeth (MAALOX/MYLANTA) 200-200-20 MG/5ML suspension Take 30 mLs by mouth every 2 (two) hours as needed for indigestion. 02/05/15   Erlene Quan, PA-C  aspirin 81 MG EC tablet Take 81 mg by mouth daily.      Historical Provider, MD  atorvastatin (LIPITOR)  40 MG tablet TAKE 1 TABLET EVERY DAY  AT  6:00  PM 03/14/15   Aleksei Plotnikov V, MD  carvedilol (COREG) 3.125 MG tablet Take 1 tablet (3.125 mg total) by mouth 2 (two) times daily. 09/05/14   Troy Sine, MD  citalopram (CELEXA) 20 MG tablet Take 1 tablet (20 mg total) by mouth daily. 12/02/14   Aleksei Plotnikov V, MD  Cyanocobalamin (VITAMIN B-12) 500 MCG SUBL Place 2 tablets (1,000 mcg total) under the tongue 1 day or 1 dose. Patient taking differently: Place 2 tablets under the tongue daily.  12/02/14   Aleksei Plotnikov V, MD  fluticasone (FLONASE) 50 MCG/ACT nasal spray Place 1 spray into both nostrils daily as needed for allergies or rhinitis.    Historical Provider, MD  furosemide (LASIX) 40 MG tablet Take 1 tablet (40 mg total) by mouth daily. 12/02/14   Aleksei Plotnikov V, MD  HYDROcodone-acetaminophen (NORCO/VICODIN) 5-325 MG per tablet Take 0.5-1 tablets by mouth every 6 (six) hours as needed for moderate pain or severe pain. 02/13/15   Aleksei Plotnikov V, MD  irbesartan (AVAPRO) 150 MG tablet Take 1/2 tablet daily Patient taking differently: Take 75 mg by mouth daily.  12/04/14   Aleksei Plotnikov V, MD  isosorbide mononitrate (IMDUR) 60 MG 24 hr tablet Take 1 tablet (60 mg total) by mouth daily. 07/05/14   Troy Sine, MD  ketoconazole (NIZORAL) 2 % cream Apply 1 application topically daily. Under breasts 02/28/15   Aleksei Plotnikov V, MD  lidocaine (LIDODERM) 5 % Place 1 patch onto the skin once. Remove & Discard patch within 12 hours or as directed by MD 02/11/15   Linton Flemings, MD  metFORMIN (GLUCOPHAGE) 500 MG tablet TAKE 1 TABLET TWICE DAILY BEFORE A MEAL 03/10/15   Aleksei Plotnikov V, MD  omeprazole (PRILOSEC) 20 MG capsule Take 1 capsule (20 mg total) by mouth daily. 12/02/14   Aleksei Plotnikov V, MD  potassium chloride SA (K-DUR,KLOR-CON) 20 MEQ tablet Take 1 tablet by mouth daily. 12/06/14   Historical Provider, MD   BP 149/57 mmHg  Pulse 91  Temp(Src) 97.8 F (36.6 C) (Oral)   Resp 18  SpO2 98% Physical Exam  Constitutional: She is oriented to person, place, and time. She appears well-developed and well-nourished. No distress.  HENT:  Head: Normocephalic and atraumatic.  Mouth/Throat: Oropharynx is clear and moist.  Eyes: Conjunctivae are normal. Pupils are equal, round, and reactive to light. No scleral icterus.  Neck: Neck supple.  Cardiovascular: Normal rate, regular rhythm, normal heart sounds and intact distal pulses.   No murmur heard. Pulmonary/Chest: Effort normal. No stridor. No respiratory distress. She has rales (trace, bibasilar).  Abdominal: Soft. Bowel sounds are normal. She exhibits no distension. There is no tenderness.  Musculoskeletal: Normal range of motion.       Back:  Neurological: She  is alert and oriented to person, place, and time.  Skin: Skin is warm and dry. No rash noted.  Psychiatric: She has a normal mood and affect. Her behavior is normal.  Nursing note and vitals reviewed.   ED Course  Procedures (including critical care time) Labs Review Labs Reviewed  CBC WITH DIFFERENTIAL/PLATELET - Abnormal; Notable for the following:    Hemoglobin 11.8 (*)    HCT 35.9 (*)    All other components within normal limits  BASIC METABOLIC PANEL - Abnormal; Notable for the following:    Glucose, Bld 170 (*)    BUN 27 (*)    Creatinine, Ser 1.51 (*)    GFR calc non Af Amer 30 (*)    GFR calc Af Amer 35 (*)    All other components within normal limits  URINALYSIS, ROUTINE W REFLEX MICROSCOPIC (NOT AT [facility]) - Abnormal; Notable for the following:    Glucose, UA 500 (*)    All other components within normal limits  TROPONIN I    Imaging Review Dg Chest 2 View  04/28/2015   CLINICAL DATA:  Patient is was here 2 months ago. Dropped by RN. Has had back pain. History CHF, heart stents. History of diabetes.  EXAM: CHEST  2 VIEW  COMPARISON:  04/18/2015.  FINDINGS: Heart is mildly enlarged. Aorta is tortuous and partially calcified. There are  no focal consolidations or pleural effusions. No pulmonary edema. Moderate mid thoracic spondylosis.  IMPRESSION: Cardiomegaly.   Electronically Signed   By: Nolon Nations M.D.   On: 04/28/2015 18:00   Dg Thoracic Spine 2 View  04/28/2015   CLINICAL DATA:  Pt states that she was here 2 months ago and was dropped by RN; ever since she has had back pain Hx: CHF, Heart Stent placement 2014, HTN, Diabetes, non-smoker  EXAM: THORACIC SPINE 2 VIEWS  COMPARISON:  04/18/2015  FINDINGS: There is lower thoracic spondylosis. There is anterior wedging of L1. No suspicious lytic or blastic lesions are identified.  IMPRESSION: Anterior wedge compression of L1, not fully evaluated on this exam. No other acute abnormalities.   Electronically Signed   By: Nolon Nations M.D.   On: 04/28/2015 18:04   Dg Lumbar Spine Complete  04/28/2015   CLINICAL DATA:  Status post fall 2 months ago.  Back pain.  EXAM: LUMBAR SPINE - COMPLETE 4+ VIEW  COMPARISON:  02/11/2015  FINDINGS: There are 5 nonrib bearing lumbar-type vertebral bodies.  Subacute T12 vertebral body compression fracture similar in appearance to 02/11/2015.  The alignment is anatomic. There is no spondylolysis. There is no static listhesis.  There is no acute fracture.  There is mild degenerative disc disease of the lumbar spine.  The SI joints are unremarkable.  IMPRESSION: 1. No acute osseous injury of the lumbar spine. 2. Subacute T12 vertebral body compression fracture similar in appearance to 02/11/2015.   Electronically Signed   By: Kathreen Devoid   On: 04/28/2015 18:03   I have personally reviewed and evaluated these images and lab results as part of my medical decision-making.   EKG Interpretation None      MDM   Final diagnoses:  Left-sided thoracic back pain  T12 compression fracture, initial encounter    81 year old female presenting with left scapular pain since yesterday. History seems most consistent with musculoskeletal pain. However, she  complains of scapular pain, but seems more tender in her mid left back. Will obtain UA, basic labs, and plain film imaging  Imaging shows  T12 compression fracture, stable.  Advised follow up.  On multiple rechecks, she stated her pain was significantly improved.    Serita Grit, MD 04/28/15 2047

## 2015-04-28 NOTE — ED Notes (Signed)
Dr. Wofford at bedside 

## 2015-04-28 NOTE — Discharge Instructions (Signed)
Back, Compression Fracture °A compression fracture happens when a force is put upon the length of your spine. Slipping and falling on your bottom are examples of such a force. When this happens, sometimes the force is great enough to compress the building blocks (vertebral bodies) of your spine. Although this causes a lot of pain, this can usually be treated at home, unless your caregiver feels hospitalization is needed for pain control. °Your backbone (spinal column) is made up of 24 main vertebral bodies in addition to the sacrum and coccyx (see illustration). These are held together by tough fibrous tissues (ligaments) and by support of your muscles. Nerve roots pass through the openings between the vertebrae. A sudden wrenching move, injury, or a fall may cause a compression fracture of one of the vertebral bodies. This may result in back pain or spread of pain into the belly (abdomen), the buttocks, and down the leg into the foot. Pain may also be created by muscle spasm alone. °Large studies have been undertaken to determine the best possible course of action to help your back following injury and also to prevent future problems. The recommendations are as follows. °FOLLOWING A COMPRESSION FRACTURE: °Do the following only if advised by your caregiver.  °· If a back brace has been suggested or provided, wear it as directed. °· Do not stop wearing the back brace unless instructed by your caregiver. °· When allowed to return to regular activities, avoid a sedentary lifestyle. Actively exercise. Sporadic weekend binges of tennis, racquetball, or waterskiing may actually aggravate or create problems, especially if you are not in condition for that activity. °· Avoid sports requiring sudden body movements until you are in condition for them. Swimming and walking are safer activities. °· Maintain good posture. °· Avoid obesity. °· If not already done, you should have a DEXA scan. Based on the results, be treated for  osteoporosis. °FOLLOWING ACUTE (SUDDEN) INJURY: °· Only take over-the-counter or prescription medicines for pain, discomfort, or fever as directed by your caregiver. °· Use bed rest for only the most extreme acute episode. Prolonged bed rest may aggravate your condition. Ice used for acute conditions is effective. Use a large plastic bag filled with ice. Wrap it in a towel. This also provides excellent pain relief. This may be continuous. Or use it for 30 minutes every 2 hours during acute phase, then as needed. Heat for 30 minutes prior to activities is helpful. °· As soon as the acute phase (the time when your back is too painful for you to do normal activities) is over, it is important to resume normal activities and work hardening programs. Back injuries can cause potentially marked changes in lifestyle. So it is important to attack these problems aggressively. °· See your caregiver for continued problems. He or she can help or refer you for appropriate exercises, physical therapy, and work hardening if needed. °· If you are given narcotic medications for your condition, for the next 24 hours do not: °¨ Drive. °¨ Operate machinery or power tools. °¨ Sign legal documents. °· Do not drink alcohol, or take sleeping pills or other medications that may interfere with treatment. °If your caregiver has given you a follow-up appointment, it is very important to keep that appointment. Not keeping the appointment could result in a chronic or permanent injury, pain, and disability. If there is any problem keeping the appointment, you must call back to this facility for assistance.  °SEEK IMMEDIATE MEDICAL CARE IF: °· You develop numbness,   tingling, weakness, or problems with the use of your arms or legs. °· You develop severe back pain not relieved with medications. °· You have changes in bowel or bladder control. °· You have increasing pain in any areas of the body. °Document Released: 08/12/2005 Document Revised:  12/27/2013 Document Reviewed: 03/16/2008 °[employer]® Patient Information ©2015 [employer]. This information is not intended to replace advice given to you by your health care provider. Make sure you discuss any questions you have with your health care provider. ° °

## 2015-04-28 NOTE — ED Notes (Signed)
Pt here for left upper back.flank pain since last night. Denies N,V,D. Denies fall or injury

## 2015-04-30 ENCOUNTER — Emergency Department (HOSPITAL_COMMUNITY)
Admission: EM | Admit: 2015-04-30 | Discharge: 2015-04-30 | Disposition: A | Discharge status: Home / Self Care | Payer: Commercial Managed Care - HMO | Attending: Emergency Medicine | Admitting: Emergency Medicine

## 2015-04-30 ENCOUNTER — Encounter (HOSPITAL_COMMUNITY): Payer: Self-pay | Admitting: Emergency Medicine

## 2015-04-30 DIAGNOSIS — I5022 Chronic systolic (congestive) heart failure: Secondary | ICD-10-CM | POA: Diagnosis not present

## 2015-04-30 DIAGNOSIS — Z8781 Personal history of (healed) traumatic fracture: Secondary | ICD-10-CM | POA: Diagnosis not present

## 2015-04-30 DIAGNOSIS — Z9981 Dependence on supplemental oxygen: Secondary | ICD-10-CM | POA: Diagnosis not present

## 2015-04-30 DIAGNOSIS — Z9889 Other specified postprocedural states: Secondary | ICD-10-CM | POA: Diagnosis not present

## 2015-04-30 DIAGNOSIS — I10 Essential (primary) hypertension: Secondary | ICD-10-CM | POA: Insufficient documentation

## 2015-04-30 DIAGNOSIS — Y939 Activity, unspecified: Secondary | ICD-10-CM | POA: Insufficient documentation

## 2015-04-30 DIAGNOSIS — G4730 Sleep apnea, unspecified: Secondary | ICD-10-CM | POA: Insufficient documentation

## 2015-04-30 DIAGNOSIS — Y929 Unspecified place or not applicable: Secondary | ICD-10-CM | POA: Insufficient documentation

## 2015-04-30 DIAGNOSIS — I252 Old myocardial infarction: Secondary | ICD-10-CM | POA: Insufficient documentation

## 2015-04-30 DIAGNOSIS — Z79899 Other long term (current) drug therapy: Secondary | ICD-10-CM | POA: Insufficient documentation

## 2015-04-30 DIAGNOSIS — M545 Low back pain: Secondary | ICD-10-CM | POA: Diagnosis not present

## 2015-04-30 DIAGNOSIS — E119 Type 2 diabetes mellitus without complications: Secondary | ICD-10-CM | POA: Insufficient documentation

## 2015-04-30 DIAGNOSIS — Z872 Personal history of diseases of the skin and subcutaneous tissue: Secondary | ICD-10-CM | POA: Diagnosis not present

## 2015-04-30 DIAGNOSIS — M1990 Unspecified osteoarthritis, unspecified site: Secondary | ICD-10-CM | POA: Diagnosis not present

## 2015-04-30 DIAGNOSIS — W04XXXA Fall while being carried or supported by other persons, initial encounter: Secondary | ICD-10-CM | POA: Insufficient documentation

## 2015-04-30 DIAGNOSIS — S22080A Wedge compression fracture of T11-T12 vertebra, initial encounter for closed fracture: Secondary | ICD-10-CM | POA: Diagnosis not present

## 2015-04-30 DIAGNOSIS — Y999 Unspecified external cause status: Secondary | ICD-10-CM | POA: Insufficient documentation

## 2015-04-30 DIAGNOSIS — M546 Pain in thoracic spine: Secondary | ICD-10-CM | POA: Diagnosis present

## 2015-04-30 DIAGNOSIS — S22088A Other fracture of T11-T12 vertebra, initial encounter for closed fracture: Secondary | ICD-10-CM | POA: Diagnosis not present

## 2015-04-30 DIAGNOSIS — I2510 Atherosclerotic heart disease of native coronary artery without angina pectoris: Secondary | ICD-10-CM | POA: Insufficient documentation

## 2015-04-30 DIAGNOSIS — Z8719 Personal history of other diseases of the digestive system: Secondary | ICD-10-CM | POA: Insufficient documentation

## 2015-04-30 DIAGNOSIS — Z9861 Coronary angioplasty status: Secondary | ICD-10-CM | POA: Diagnosis not present

## 2015-04-30 DIAGNOSIS — Z8673 Personal history of transient ischemic attack (TIA), and cerebral infarction without residual deficits: Secondary | ICD-10-CM | POA: Diagnosis not present

## 2015-04-30 DIAGNOSIS — M549 Dorsalgia, unspecified: Secondary | ICD-10-CM

## 2015-04-30 DIAGNOSIS — E785 Hyperlipidemia, unspecified: Secondary | ICD-10-CM | POA: Insufficient documentation

## 2015-04-30 MED ORDER — HYDROCODONE-ACETAMINOPHEN 5-325 MG PO TABS: 0.5000 | ORAL_TABLET | ORAL | Status: DC | PRN

## 2015-04-30 NOTE — ED Notes (Signed)
Pt here with continuing back pain, seen for the same 2 days ago with a t12 compression fracture.  Pt spouse reports he is having a hard time getting pt to understand that there is nothing to be done, i.e. surgery.  Provided pt education about diagnosis and pain medication.

## 2015-04-30 NOTE — ED Provider Notes (Signed)
CSN: [id number]     Arrival date & time 04/30/15  0938 History   First MD Initiated Contact with Patient 04/30/15 [phone]     Chief Complaint  Patient presents with  . Back Pain     (Consider location/radiation/quality/duration/timing/severity/associated sxs/prior Treatment) HPI Comments: Patient presents today with upper back pain x 3 days.  Pain located just inferior to the left scapula and does not radiate.  Pain worse with movement.  She denies acute injury or trauma, but states that a RN dropped her on the bed while putting her on a bed pan a couple of months ago.  She was seen in the ED two days ago for the same and was diagnosed with a T12 compression fracture by xray.  She also had lumbar spine xray and CXR performed, which were unremarkable.  She states that the pain today is the same pain that she had 2 days ago.  She states that she was unaware that the pain would continue.  She has been taking 0.5 tablets of Norco for the pain, which helps.  She denies fever, chills, chest pain, SOB, bowel/bladder incontinence, numbness, or tingling.  The history is provided by the patient.    Past Medical History  Diagnosis Date  . Hypertension   . Osteoarthritis   . Osteopenia   . Diverticulosis of colon   . Diabetes mellitus     type II  . CAD (coronary artery disease)   . MI (myocardial infarction) 05/10/2010    Dr Claiborne Billings, Rx'd with CFX  DES  . HYPERLIPIDEMIA 01/07/2008  . GIB (gastrointestinal bleeding) 2002    Diverticular  . Skin disorder   . Visual disorder   . Thrombus   . Sleep apnea     cpap therapy  . Shortness of breath dyspnea   . Frequent urination   . Cerebral aneurysm   . Stroke 2002   Past Surgical History  Procedure Laterality Date  . Abdominal hysterectomy  1980    no bso  . Coronary angioplasty with stent placement  05/11/2010    stent CFX  . Cardiac catheterization  07/2011    LAD OK, D1 80%, CFX 30% w/ patent stent, RCA 50-60%, EF 40%  . Thrombectomy    .  Cardiac catheterization    . Doppler echocardiography    . Myocardial perfusion study    . Left heart catheterization with coronary angiogram N/A 08/18/2011    Procedure: LEFT HEART CATHETERIZATION WITH CORONARY ANGIOGRAM;  Surgeon: Wellington Hampshire, MD;  Location: [facility] CATH LAB;  Service: Cardiovascular;  Laterality: N/A;  . Left heart catheterization with coronary angiogram N/A 06/08/2014    Procedure: LEFT HEART CATHETERIZATION WITH CORONARY ANGIOGRAM;  Surgeon: Troy Sine, MD;  Location: [facility] CATH LAB;  Service: Cardiovascular;  Laterality: N/A;  . Tonsillectomy    . Appendectomy     Family History  Problem Relation Age of Onset  . Hypertension Other   . CAD      father   Social History  Substance Use Topics  . Smoking status: Never Smoker   . Smokeless tobacco: Never Used  . Alcohol Use: No   OB History    No data available     Review of Systems  All other systems reviewed and are negative.     Allergies  Clopidogrel bisulfate; Lipitor; and Lisinopril  Home Medications   Prior to Admission medications   Medication Sig Start Date End Date Taking? Authorizing Provider  ACCU-CHEK SOFTCLIX LANCETS lancets  10/21/14   Historical Provider, MD  acetaminophen (TYLENOL) 325 MG tablet Take 2 tablets (650 mg total) by mouth every 4 (four) hours as needed for headache or mild pain. 02/05/15   Erlene Quan, PA-C  alum & mag hydroxide-simeth (MAALOX/MYLANTA) 200-200-20 MG/5ML suspension Take 30 mLs by mouth every 2 (two) hours as needed for indigestion. 02/05/15   Erlene Quan, PA-C  aspirin 81 MG EC tablet Take 81 mg by mouth daily.      Historical Provider, MD  atorvastatin (LIPITOR) 40 MG tablet TAKE 1 TABLET EVERY DAY  AT  6:00  PM 03/14/15   Aleksei Plotnikov V, MD  carvedilol (COREG) 3.125 MG tablet Take 1 tablet (3.125 mg total) by mouth 2 (two) times daily. 09/05/14   Troy Sine, MD  citalopram (CELEXA) 20 MG tablet Take 1 tablet (20 mg total) by mouth daily. 12/02/14    Aleksei Plotnikov V, MD  Cyanocobalamin (VITAMIN B-12) 500 MCG SUBL Place 2 tablets (1,000 mcg total) under the tongue 1 day or 1 dose. Patient taking differently: Place 2 tablets under the tongue daily.  12/02/14   Aleksei Plotnikov V, MD  fluticasone (FLONASE) 50 MCG/ACT nasal spray Place 1 spray into both nostrils daily as needed for allergies or rhinitis.    Historical Provider, MD  furosemide (LASIX) 40 MG tablet Take 1 tablet (40 mg total) by mouth daily. Patient taking differently: Take 40 mg by mouth 2 (two) times daily.  12/02/14   Aleksei Plotnikov V, MD  HYDROcodone-acetaminophen (NORCO/VICODIN) 5-325 MG per tablet Take 0.5-1 tablets by mouth every 6 (six) hours as needed for moderate pain or severe pain. 02/13/15   Aleksei Plotnikov V, MD  HYDROcodone-acetaminophen (NORCO/VICODIN) 5-325 MG per tablet Take 0.5 tablets by mouth every 6 (six) hours as needed for severe pain. 04/28/15   Serita Grit, MD  irbesartan (AVAPRO) 75 MG tablet Take 75 mg by mouth daily.    Historical Provider, MD  isosorbide mononitrate (IMDUR) 60 MG 24 hr tablet Take 1 tablet (60 mg total) by mouth daily. 07/05/14   Troy Sine, MD  ketoconazole (NIZORAL) 2 % cream Apply 1 application topically daily. Under breasts 02/28/15   Aleksei Plotnikov V, MD  lidocaine (LIDODERM) 5 % Place 1 patch onto the skin once. Remove & Discard patch within 12 hours or as directed by MD 02/11/15   Linton Flemings, MD  metFORMIN (GLUCOPHAGE) 500 MG tablet TAKE 1 TABLET TWICE DAILY BEFORE A MEAL 03/10/15   Aleksei Plotnikov V, MD  omeprazole (PRILOSEC) 20 MG capsule Take 1 capsule (20 mg total) by mouth daily. 12/02/14   Aleksei Plotnikov V, MD  potassium chloride SA (K-DUR,KLOR-CON) 20 MEQ tablet Take 1 tablet by mouth daily. 12/06/14   Historical Provider, MD   BP 168/87 mmHg  Pulse 62  Temp(Src) 97.7 F (36.5 C) (Oral)  SpO2 100% Physical Exam  Constitutional: She appears well-developed and well-nourished.  HENT:  Head: Normocephalic and  atraumatic.  Mouth/Throat: Oropharynx is clear and moist.  Neck: Normal range of motion. Neck supple.  Cardiovascular: Normal rate, regular rhythm and normal heart sounds.   Pulses:      Dorsalis pedis pulses are 2+ on the right side, and 2+ on the left side.  Pulmonary/Chest: Effort normal and breath sounds normal. No respiratory distress. She has no wheezes. She has no rales.  Musculoskeletal:       Left shoulder: She exhibits normal range of motion, no tenderness, no bony tenderness, no swelling, no deformity  and normal pulse.       Cervical back: She exhibits normal range of motion, no tenderness, no bony tenderness, no swelling, no edema and no deformity.       Thoracic back: She exhibits normal range of motion, no tenderness, no bony tenderness, no swelling, no edema and no deformity.       Lumbar back: She exhibits normal range of motion, no tenderness, no bony tenderness, no swelling, no edema and no deformity.  Increased back pain with ROM of the left shoulder  Neurological: She is alert. She has normal strength.  Reflex Scores:      Patellar reflexes are 2+ on the right side and 2+ on the left side. Distal sensation of both feet intact   Skin: Skin is warm and dry.  Psychiatric: She has a normal mood and affect.  Nursing note and vitals reviewed.   ED Course  Procedures (including critical care time) Labs Review Labs Reviewed - No data to display  Imaging Review Dg Chest 2 View  04/28/2015   CLINICAL DATA:  Patient is was here 2 months ago. Dropped by RN. Has had back pain. History CHF, heart stents. History of diabetes.  EXAM: CHEST  2 VIEW  COMPARISON:  04/18/2015.  FINDINGS: Heart is mildly enlarged. Aorta is tortuous and partially calcified. There are no focal consolidations or pleural effusions. No pulmonary edema. Moderate mid thoracic spondylosis.  IMPRESSION: Cardiomegaly.   Electronically Signed   By: Nolon Nations M.D.   On: 04/28/2015 18:00   Dg Thoracic Spine 2  View  04/28/2015   CLINICAL DATA:  Pt states that she was here 2 months ago and was dropped by RN; ever since she has had back pain Hx: CHF, Heart Stent placement 2014, HTN, Diabetes, non-smoker  EXAM: THORACIC SPINE 2 VIEWS  COMPARISON:  04/18/2015  FINDINGS: There is lower thoracic spondylosis. There is anterior wedging of L1. No suspicious lytic or blastic lesions are identified.  IMPRESSION: Anterior wedge compression of L1, not fully evaluated on this exam. No other acute abnormalities.   Electronically Signed   By: Nolon Nations M.D.   On: 04/28/2015 18:04   Dg Lumbar Spine Complete  04/28/2015   CLINICAL DATA:  Status post fall 2 months ago.  Back pain.  EXAM: LUMBAR SPINE - COMPLETE 4+ VIEW  COMPARISON:  02/11/2015  FINDINGS: There are 5 nonrib bearing lumbar-type vertebral bodies.  Subacute T12 vertebral body compression fracture similar in appearance to 02/11/2015.  The alignment is anatomic. There is no spondylolysis. There is no static listhesis.  There is no acute fracture.  There is mild degenerative disc disease of the lumbar spine.  The SI joints are unremarkable.  IMPRESSION: 1. No acute osseous injury of the lumbar spine. 2. Subacute T12 vertebral body compression fracture similar in appearance to 02/11/2015.   Electronically Signed   By: Kathreen Devoid   On: 04/28/2015 18:03   I have personally reviewed and evaluated these images and lab results as part of my medical decision-making.   EKG Interpretation None      MDM   Final diagnoses:  None   Patient presents today with a chief complaint of back pain.  She was seen in the ED two days ago for the same and was diagnosed with a T12 compression fracture.  She reports that the pain is unchanged.  Pain controlled with Norco.  No neurological deficits on exam.  Feel that the patient is stable for discharge.  Patient  also evaluated by Dr. Winfred Leeds who is in agreement with the plan.    Hyman Bible, PA-C 04/30/15 [place], MD 04/30/15 [phone]

## 2015-04-30 NOTE — Discharge Instructions (Signed)
Take pain medication as prescribed.  You may continue to have pain for weeks.  Do not drive or operate heavy machinery for 4-6 hours after taking pain medication.  Is is also recommended that you use Sennakot S for constipation.  Use as directed on the box.  Pain medication can make you constipated.

## 2015-04-30 NOTE — ED Provider Notes (Signed)
Complaints of low back pain for the past 3 days. Prescribed Norco at visit here 2 days ago determined to have T12 compression fracture. She presents today as she was unaware that pain would continue for prolonged period of time. Pain is well controlled with Norco. No other associated symptoms  Orlie Dakin, MD 04/30/15 [phone]

## 2015-05-03 ENCOUNTER — Encounter: Payer: Self-pay | Admitting: Nurse Practitioner

## 2015-05-03 ENCOUNTER — Ambulatory Visit: Payer: Commercial Managed Care - HMO | Admitting: Nurse Practitioner

## 2015-05-03 ENCOUNTER — Ambulatory Visit (INDEPENDENT_AMBULATORY_CARE_PROVIDER_SITE_OTHER): Payer: Commercial Managed Care - HMO | Admitting: Nurse Practitioner

## 2015-05-03 VITALS — BP 140/82 | HR 83 | Ht 65.0 in | Wt 172.2 lb

## 2015-05-03 DIAGNOSIS — I5022 Chronic systolic (congestive) heart failure: Secondary | ICD-10-CM | POA: Diagnosis not present

## 2015-05-03 DIAGNOSIS — E785 Hyperlipidemia, unspecified: Secondary | ICD-10-CM

## 2015-05-03 DIAGNOSIS — I255 Ischemic cardiomyopathy: Secondary | ICD-10-CM | POA: Diagnosis not present

## 2015-05-03 DIAGNOSIS — I2510 Atherosclerotic heart disease of native coronary artery without angina pectoris: Secondary | ICD-10-CM | POA: Diagnosis not present

## 2015-05-03 DIAGNOSIS — I10 Essential (primary) hypertension: Secondary | ICD-10-CM

## 2015-05-03 DIAGNOSIS — S22080A Wedge compression fracture of T11-T12 vertebra, initial encounter for closed fracture: Secondary | ICD-10-CM | POA: Insufficient documentation

## 2015-05-03 NOTE — Progress Notes (Signed)
Patient Name: Mary Cook Date of Encounter: 05/03/2015  Primary Care Provider:  Walker Kehr, MD Primary Cardiologist:  Corky Downs, MD   Chief Complaint  81 year old female with a history of CAD and ischemic cardiopathy presents for follow-up related to chronic systolic and just of heart failure.  Past Medical History   Past Medical History  Diagnosis Date  . Essential hypertension   . Osteoarthritis   . Osteopenia   . Diverticulosis of colon   . Type II diabetes mellitus   . CAD (coronary artery disease)     a. 04/2010 MI DES x 2 to LCX;  b. 05/2014 Cath: EF 35%, patent LCX stents w/ 70% stenosis in small AV groove LCX, Diag 50-60, RCA 50-60p->Med Rx.  Marland Kitchen Hyperlipidemia   . GIB (gastrointestinal bleeding)     a. 2002 Diverticular bleed.  . Skin disorder   . Visual disorder   . Thrombus   . Sleep apnea     cpap therapy  . Frequent urination   . Cerebral aneurysm   . Stroke 2002  . Chronic systolic CHF (congestive heart failure)     a. 01/2015 Echo: EF 30-35%, sev inflat HK, Gr 1DD, mild AI.  . Ischemic cardiomyopathy     a. 01/2015 Echo: EF 30-35%.  . T12 compression fracture     a. 04/2015.   Past Surgical History  Procedure Laterality Date  . Abdominal hysterectomy  1980    no bso  . Coronary angioplasty with stent placement  05/11/2010    stent CFX  . Cardiac catheterization  07/2011    LAD [place], D1 80%, CFX 30% w/ patent stent, RCA 50-60%, EF 40%  . Thrombectomy    . Cardiac catheterization    . Doppler echocardiography    . Myocardial perfusion study    . Left heart catheterization with coronary angiogram N/A 08/18/2011    Procedure: LEFT HEART CATHETERIZATION WITH CORONARY ANGIOGRAM;  Surgeon: Wellington Hampshire, MD;  Location: [facility] CATH LAB;  Service: Cardiovascular;  Laterality: N/A;  . Left heart catheterization with coronary angiogram N/A 06/08/2014    Procedure: LEFT HEART CATHETERIZATION WITH CORONARY ANGIOGRAM;  Surgeon: Troy Sine, MD;  Location:  [facility] CATH LAB;  Service: Cardiovascular;  Laterality: N/A;  . Tonsillectomy    . Appendectomy      Allergies  Allergies  Allergen Reactions  . Clopidogrel Bisulfate Nausea And Vomiting    Patient is not aware of this allergy  . Lipitor [Atorvastatin] Other (See Comments)    Weak legs  . Lisinopril Cough    Patient is not aware of this allergy    HPI  81 year old female with the above complex problem list. She is status post prior stenting of the left circumflex and has an ischemic myopathy with an EF of 30-35%. She was most recently hospitalized in June 2016 with heart failure requiring IV diuresis. She did not have clinic follow-up following that hospitalization and was seen back in the emergency department in August secondary to volume overload. She diuresis in the ER and subsequently discharged home. More recently, she's been experiencing back pain. She's been seen in the emergency department twice earlier this month with a diagnosis of a T12 compression fracture. This is been treated conservatively with pain management.  Patient has chronic dyspnea and exertion which is not significant improved since her recent hospitalization in June. Her weight has been reasonably stable and is 172 pounds today. She occasionally experiences orthopnea but denies edema, early satiety,  presyncope, or syncope. She occasionally has fleeting episodes of chest discomfort lasting seconds at a time and resolving spontaneously. She eats out often-usually at a [employer] but says she doesn't add salt to her food.  Home Medications  Prior to Admission medications   Medication Sig Start Date End Date Taking? Authorizing Provider  vitamin B-12 (CYANOCOBALAMIN) 500 MCG tablet Place 500 mcg under the tongue daily.   Yes Historical Provider, MD  ACCU-CHEK SOFTCLIX LANCETS lancets  10/21/14   Historical Provider, MD  acetaminophen (TYLENOL) 325 MG tablet Take 2 tablets (650 mg total) by mouth every 4 (four) hours  as needed for headache or mild pain. 02/05/15   Erlene Quan, PA-C  alum & mag hydroxide-simeth (MAALOX/MYLANTA) 200-200-20 MG/5ML suspension Take 30 mLs by mouth every 2 (two) hours as needed for indigestion. 02/05/15   Erlene Quan, PA-C  aspirin 81 MG EC tablet Take 81 mg by mouth daily.      Historical Provider, MD  atorvastatin (LIPITOR) 40 MG tablet TAKE 1 TABLET EVERY DAY  AT  6:00  PM 03/14/15   Aleksei Plotnikov V, MD  carvedilol (COREG) 3.125 MG tablet Take 1 tablet (3.125 mg total) by mouth 2 (two) times daily. 09/05/14   Troy Sine, MD  citalopram (CELEXA) 20 MG tablet Take 1 tablet (20 mg total) by mouth daily. 12/02/14   Aleksei Plotnikov V, MD  fluticasone (FLONASE) 50 MCG/ACT nasal spray Place 1 spray into both nostrils daily as needed for allergies or rhinitis.    Historical Provider, MD  furosemide (LASIX) 40 MG tablet Take 1 tablet (40 mg total) by mouth daily. Patient taking differently: Take 40 mg by mouth 2 (two) times daily.  12/02/14   Aleksei Plotnikov V, MD  HYDROcodone-acetaminophen (NORCO/VICODIN) 5-325 MG per tablet Take 0.5 tablets by mouth every 4 (four) hours as needed. 04/30/15   Heather Laisure, PA-C  irbesartan (AVAPRO) 75 MG tablet Take 75 mg by mouth daily.    Historical Provider, MD  isosorbide mononitrate (IMDUR) 60 MG 24 hr tablet Take 1 tablet (60 mg total) by mouth daily. 07/05/14   Troy Sine, MD  ketoconazole (NIZORAL) 2 % cream Apply 1 application topically daily. Under breasts 02/28/15   Aleksei Plotnikov V, MD  lidocaine (LIDODERM) 5 % Place 1 patch onto the skin once. Remove & Discard patch within 12 hours or as directed by MD 02/11/15   Linton Flemings, MD  metFORMIN (GLUCOPHAGE) 500 MG tablet TAKE 1 TABLET TWICE DAILY BEFORE A MEAL 03/10/15   Aleksei Plotnikov V, MD  omeprazole (PRILOSEC) 20 MG capsule Take 1 capsule (20 mg total) by mouth daily. 12/02/14   Aleksei Plotnikov V, MD  potassium chloride SA (K-DUR,KLOR-CON) 20 MEQ tablet Take 1 tablet by mouth  daily. 12/06/14   Historical Provider, MD    Review of Systems  As above, she is chronic dyspnea on exertion. She is not very active at home. She does occasionally awake suddenly with dyspnea. She does have a history of sleep apnea but does not use CPap. She has fleeting episodes of chest discomfort which have been present for years. She denies edema, presyncope, syncopal, or early satiety area  All other systems reviewed and are otherwise negative except as noted above.  Physical Exam  VS:  BP 140/82 mmHg  Pulse 83  Ht 5\' 5"  (1.651 m)  Wt 172 lb 3.2 oz (78.109 kg)  BMI 28.66 kg/m2 , BMI Body mass index is 28.66 kg/(m^2). GEN:  Well nourished, well developed, in no acute distress. HEENT: normal. Neck: Supple, no JVD, carotid bruits, or masses. Cardiac: RRR, no murmurs, rubs, or gallops. No clubbing, cyanosis, edema.  Radials/DP/PT 2+ and equal bilaterally.  Respiratory:  Respirations regular and unlabored, clear to auscultation bilaterally. GI: Soft, nontender, nondistended, BS + x 4. MS: no deformity or atrophy. Skin: warm and dry, no rash. Neuro:  Strength and sensation are intact. Psych: Normal affect.  Accessory Clinical Findings  Lab Results  Component Value Date   WBC 5.4 04/28/2015   HGB 11.8* 04/28/2015   HCT 35.9* 04/28/2015   MCV 92.1 04/28/2015   PLT 178 04/28/2015    Lab Results  Component Value Date   CREATININE 1.51* 04/28/2015   BUN 27* 04/28/2015   NA 140 04/28/2015   K 3.5 04/28/2015   CL 106 04/28/2015   CO2 27 04/28/2015     Assessment & Plan  1.  Chronic systolic congestive heart failure/ischemic cardiomyopathy:  Patient is euvolemic on exam today. She reports chronic dyspnea on exertion though it is not clear to what extent this is related to her volume versus deconditioning as she spends much of her day sitting, often in her husband's car as he drives to an assortment of [place] looking for deals. Her heart rate and blood pressure well controlled.  I've encouraged her to weigh herself daily, as she is not currently doing this. I have also encouraged her to avoid eating out and fast foods as this will certainly contribute to volume excess overtime. She remains on beta blocker, ARB, nitrate, and Lasix therapy.  2. Coronary artery disease: Status post prior MI and stenting of the circumflex. Stable anatomy on catheterization in 2015. She reports fleeting episodes of chest discomfort at times. She does not generally expressed chest pressure or tightness. She remains on aspirin, statin, beta blocker, and long-acting nitrate therapy.  3. Essential hypertension: Blood pressure slightly elevated today. Pressure was better upon prior visits. I will not make any changes today.  4. Hyperlipidemia: Continue statin therapy. LDL was 76 in January. Normal LFTs in June.  5. T12 compression fracture: This is being managed conservatively with pain management. She does have some discomfort related to this.  6. Type 2 diabetes mellitus: On metformin and followed by primary care.  7. Stage III chronic kidney disease: Creatinine 1.51 on September 2. This is up from late August but with in her usual trend.  8. Disposition: Follow-up with Dr. Claiborne Billings in 2-3 months or sooner as necessary.  Murray Hodgkins, NP 05/03/2015, 10:51 AM

## 2015-05-03 NOTE — Patient Instructions (Signed)
Medication Instructions:  Your physician recommends that you continue on your current medications as directed. Please refer to the Current Medication list given to you today.   Labwork: Non ordered  Testing/Procedures: None ordered  Follow-Up: Your physician recommends that you schedule a follow-up appointment in: 2 months with Dr.Kelly   Any Other Special Instructions Will Be Listed Below (If Applicable).

## 2015-05-16 ENCOUNTER — Ambulatory Visit: Payer: Commercial Managed Care - HMO | Admitting: Cardiology

## 2015-05-24 ENCOUNTER — Emergency Department (HOSPITAL_COMMUNITY)
Admission: EM | Admit: 2015-05-24 | Discharge: 2015-05-24 | Disposition: A | Discharge status: Home / Self Care | Payer: Commercial Managed Care - HMO | Attending: Emergency Medicine | Admitting: Emergency Medicine

## 2015-05-24 ENCOUNTER — Emergency Department (HOSPITAL_COMMUNITY): Payer: Commercial Managed Care - HMO

## 2015-05-24 ENCOUNTER — Encounter (HOSPITAL_COMMUNITY): Payer: Self-pay

## 2015-05-24 DIAGNOSIS — Z9861 Coronary angioplasty status: Secondary | ICD-10-CM | POA: Diagnosis not present

## 2015-05-24 DIAGNOSIS — I5022 Chronic systolic (congestive) heart failure: Secondary | ICD-10-CM | POA: Insufficient documentation

## 2015-05-24 DIAGNOSIS — G4730 Sleep apnea, unspecified: Secondary | ICD-10-CM | POA: Insufficient documentation

## 2015-05-24 DIAGNOSIS — Z8781 Personal history of (healed) traumatic fracture: Secondary | ICD-10-CM | POA: Diagnosis not present

## 2015-05-24 DIAGNOSIS — R0602 Shortness of breath: Secondary | ICD-10-CM | POA: Diagnosis not present

## 2015-05-24 DIAGNOSIS — R05 Cough: Secondary | ICD-10-CM | POA: Diagnosis not present

## 2015-05-24 DIAGNOSIS — Z8719 Personal history of other diseases of the digestive system: Secondary | ICD-10-CM | POA: Insufficient documentation

## 2015-05-24 DIAGNOSIS — R0600 Dyspnea, unspecified: Secondary | ICD-10-CM | POA: Insufficient documentation

## 2015-05-24 DIAGNOSIS — I10 Essential (primary) hypertension: Secondary | ICD-10-CM | POA: Insufficient documentation

## 2015-05-24 DIAGNOSIS — Z7982 Long term (current) use of aspirin: Secondary | ICD-10-CM | POA: Diagnosis not present

## 2015-05-24 DIAGNOSIS — E785 Hyperlipidemia, unspecified: Secondary | ICD-10-CM | POA: Insufficient documentation

## 2015-05-24 DIAGNOSIS — R0989 Other specified symptoms and signs involving the circulatory and respiratory systems: Secondary | ICD-10-CM | POA: Diagnosis not present

## 2015-05-24 DIAGNOSIS — R059 Cough, unspecified: Secondary | ICD-10-CM

## 2015-05-24 DIAGNOSIS — Z8673 Personal history of transient ischemic attack (TIA), and cerebral infarction without residual deficits: Secondary | ICD-10-CM | POA: Diagnosis not present

## 2015-05-24 DIAGNOSIS — I2510 Atherosclerotic heart disease of native coronary artery without angina pectoris: Secondary | ICD-10-CM | POA: Insufficient documentation

## 2015-05-24 DIAGNOSIS — Z9889 Other specified postprocedural states: Secondary | ICD-10-CM | POA: Diagnosis not present

## 2015-05-24 DIAGNOSIS — E876 Hypokalemia: Secondary | ICD-10-CM

## 2015-05-24 DIAGNOSIS — Z872 Personal history of diseases of the skin and subcutaneous tissue: Secondary | ICD-10-CM | POA: Insufficient documentation

## 2015-05-24 DIAGNOSIS — Z86718 Personal history of other venous thrombosis and embolism: Secondary | ICD-10-CM | POA: Insufficient documentation

## 2015-05-24 DIAGNOSIS — M1990 Unspecified osteoarthritis, unspecified site: Secondary | ICD-10-CM | POA: Diagnosis not present

## 2015-05-24 DIAGNOSIS — Z79899 Other long term (current) drug therapy: Secondary | ICD-10-CM | POA: Diagnosis not present

## 2015-05-24 LAB — CBC WITH DIFFERENTIAL/PLATELET
BASOS ABS: 0 10*3/uL (ref 0.0–0.1)
Basophils Relative: 0 %
Eosinophils Absolute: 0.1 10*3/uL (ref 0.0–0.7)
Eosinophils Relative: 3 %
HEMATOCRIT: 33.6 % — AB (ref 36.0–46.0)
Hemoglobin: 11.3 g/dL — ABNORMAL LOW (ref 12.0–15.0)
LYMPHS PCT: 25 %
Lymphs Abs: 1.3 10*3/uL (ref 0.7–4.0)
MCH: 30.1 pg (ref 26.0–34.0)
MCHC: 33.6 g/dL (ref 30.0–36.0)
MCV: 89.6 fL (ref 78.0–100.0)
MONO ABS: 0.5 10*3/uL (ref 0.1–1.0)
MONOS PCT: 9 %
NEUTROS ABS: 3.1 10*3/uL (ref 1.7–7.7)
Neutrophils Relative %: 63 %
Platelets: 159 10*3/uL (ref 150–400)
RBC: 3.75 MIL/uL — ABNORMAL LOW (ref 3.87–5.11)
RDW: 12.3 % (ref 11.5–15.5)
WBC: 5 10*3/uL (ref 4.0–10.5)

## 2015-05-24 LAB — BASIC METABOLIC PANEL
ANION GAP: 7 (ref 5–15)
BUN: 18 mg/dL (ref 6–20)
CALCIUM: 8.5 mg/dL — AB (ref 8.9–10.3)
CO2: 26 mmol/L (ref 22–32)
Chloride: 105 mmol/L (ref 101–111)
Creatinine, Ser: 1.1 mg/dL — ABNORMAL HIGH (ref 0.44–1.00)
GFR calc Af Amer: 51 mL/min — ABNORMAL LOW (ref >60)
GFR calc non Af Amer: 44 mL/min — ABNORMAL LOW (ref >60)
GLUCOSE: 164 mg/dL — AB (ref 65–99)
Potassium: 3.2 mmol/L — ABNORMAL LOW (ref 3.5–5.1)
Sodium: 138 mmol/L (ref 135–145)

## 2015-05-24 LAB — BRAIN NATRIURETIC PEPTIDE: B Natriuretic Peptide: 424 pg/mL — ABNORMAL HIGH (ref 0.0–100.0)

## 2015-05-24 LAB — TROPONIN I: Troponin I: <0.03 ng/mL (ref <0.031)

## 2015-05-24 MED ORDER — IPRATROPIUM-ALBUTEROL 0.5-2.5 (3) MG/3ML IN SOLN
3.0000 mL | Freq: Once | RESPIRATORY_TRACT | Status: AC
  Administered 2015-05-24: 3 mL via RESPIRATORY_TRACT
  Filled 2015-05-24: qty 3

## 2015-05-24 MED ORDER — POTASSIUM CHLORIDE 20 MEQ PO PACK
40.0000 meq | PACK | Freq: Once | ORAL | Status: AC
  Administered 2015-05-24: 40 meq via ORAL
  Filled 2015-05-24: qty 2

## 2015-05-24 MED ORDER — FUROSEMIDE 10 MG/ML IJ SOLN
20.0000 mg | Freq: Once | INTRAMUSCULAR | Status: AC
Start: 2015-05-24 — End: 2015-05-24
  Administered 2015-05-24: 20 mg via INTRAVENOUS
  Filled 2015-05-24: qty 2

## 2015-05-24 NOTE — ED Notes (Signed)
Pt reports nonproductive cough for over a week, sob, and generalized weakness.

## 2015-05-24 NOTE — ED Notes (Signed)
Spouse reports has had 2 concussions within the past 4 months and broken rib on the L side.  Spouse says a nurse dropped her while she was at [facility].

## 2015-05-24 NOTE — Discharge Instructions (Signed)
If you were given medicines take as directed.  If you are on coumadin or contraceptives realize their levels and effectiveness is altered by many different medicines.  If you have any reaction (rash, tongues swelling, other) to the medicines stop taking and see a physician.   Take an extra 40 mg of Lasix tomorrow. If your blood pressure was elevated in the ER make sure you follow up for management with a primary doctor or return for chest pain, shortness of breath or stroke symptoms.  Please follow up as directed and return to the ER or see a physician for new or worsening symptoms.  Thank you. Filed Vitals:   05/24/15 1004 05/24/15 1030  BP: 173/77 160/70  Pulse: 67 59  Temp: 98.1 F (36.7 C)   TempSrc: Oral   Resp: 20 12  Height: 5\' 5"  (1.651 m)   Weight: 172 lb (78.019 kg)   SpO2: 97% 99%

## 2015-05-24 NOTE — ED Notes (Signed)
Patient with no complaints at this time. Respirations even and unlabored. Skin warm/dry. Discharge instructions reviewed with patient at this time. Patient given opportunity to voice concerns/ask questions. Patient discharged at this time and left Emergency Department with steady gait.   

## 2015-05-24 NOTE — ED Notes (Addendum)
Pt c/o SOB and weakness that started over the last 2 months and has been getting progressively worse. Pt also c/o non productive x 2 weeks. Husband at bedside reports pt has been having SOB over the last year but that it has gotten "really bad" over the last 2 months. Pt reports her SOB usually happens when she gets up to walk and not at rest. Husband reports pt has had a 30-40 lb weight increase over the last 6 months even though pt has not changed her diet. Husband reports pt is always hollering "I can't breathe, I can't breathe".

## 2015-05-24 NOTE — ED Provider Notes (Signed)
CSN: [id number]     Arrival date & time 05/24/15  8768 History  By signing my name below, I, Stephania Fragmin, attest that this documentation has been prepared under the direction and in the presence of Elnora Morrison, MD. Electronically Signed: Stephania Fragmin, ED Scribe. 05/24/2015. 1:52 PM.     Chief Complaint  Patient presents with  . Cough   The history is provided by the patient. No language interpreter was used.   HPI Comments: Mary Cook is a 81 y.o. female with a history of CAD, CHF, DM, HTN, HLD, and CVA, who presents to the Emergency Department complaining of a constant cough that began 2 months ago and has been worsening since. She also complains of SOB that is worse with walking, with onset 1 week ago. She endorses occasional orthopnea and states she is more tired than usual. Patient denies seeing a PCP recently.  She denies a history of blood clots, recent surgeries, or cancer. She denies leg pain, leg swelling, chest pain, or fever.   Past Medical History  Diagnosis Date  . Essential hypertension   . Osteoarthritis   . Osteopenia   . Diverticulosis of colon   . Type II diabetes mellitus   . CAD (coronary artery disease)     a. 04/2010 MI DES x 2 to LCX;  b. 05/2014 Cath: EF 35%, patent LCX stents w/ 70% stenosis in small AV groove LCX, Diag 50-60, RCA 50-60p->Med Rx.  Marland Kitchen Hyperlipidemia   . GIB (gastrointestinal bleeding)     a. 2002 Diverticular bleed.  . Skin disorder   . Visual disorder   . Thrombus   . Sleep apnea     cpap therapy  . Frequent urination   . Cerebral aneurysm   . Stroke 2002  . Chronic systolic CHF (congestive heart failure)     a. 01/2015 Echo: EF 30-35%, sev inflat HK, Gr 1DD, mild AI.  . Ischemic cardiomyopathy     a. 01/2015 Echo: EF 30-35%.  . T12 compression fracture     a. 04/2015.   Past Surgical History  Procedure Laterality Date  . Abdominal hysterectomy  1980    no bso  . Coronary angioplasty with stent placement  05/11/2010    stent  CFX  . Cardiac catheterization  07/2011    LAD OK, D1 80%, CFX 30% w/ patent stent, RCA 50-60%, EF 40%  . Thrombectomy    . Cardiac catheterization    . Doppler echocardiography    . Myocardial perfusion study    . Left heart catheterization with coronary angiogram N/A 08/18/2011    Procedure: LEFT HEART CATHETERIZATION WITH CORONARY ANGIOGRAM;  Surgeon: Wellington Hampshire, MD;  Location: [facility];  Service: Cardiovascular;  Laterality: N/A;  . Left heart catheterization with coronary angiogram N/A 06/08/2014    Procedure: LEFT HEART CATHETERIZATION WITH CORONARY ANGIOGRAM;  Surgeon: Troy Sine, MD;  Location: [facility];  Service: Cardiovascular;  Laterality: N/A;  . Tonsillectomy    . Appendectomy     Family History  Problem Relation Age of Onset  . Hypertension Other   . CAD      father   Social History  Substance Use Topics  . Smoking status: Never Smoker   . Smokeless tobacco: Never Used  . Alcohol Use: No   OB History    No data available     Review of Systems  Constitutional: Positive for unexpected weight change.  Respiratory: Positive for cough and shortness of  breath.   Cardiovascular: Negative for chest pain and leg swelling.  All other systems reviewed and are negative.     Allergies  Clopidogrel bisulfate; Lipitor; and Lisinopril  Home Medications   Prior to Admission medications   Medication Sig Start Date End Date Taking? Authorizing Mikhi Athey  ACCU-CHEK SOFTCLIX LANCETS lancets  10/21/14   Historical Jesse Nosbisch, MD  acetaminophen (TYLENOL) 325 MG tablet Take 2 tablets (650 mg total) by mouth every 4 (four) hours as needed for headache or mild pain. 02/05/15   Erlene Quan, PA-C  alum & mag hydroxide-simeth (MAALOX/MYLANTA) 200-200-20 MG/5ML suspension Take 30 mLs by mouth every 2 (two) hours as needed for indigestion. 02/05/15   Erlene Quan, PA-C  aspirin 81 MG EC tablet Take 81 mg by mouth daily.      Historical Earlyne Feeser, MD  atorvastatin  (LIPITOR) 40 MG tablet TAKE 1 TABLET EVERY DAY  AT  6:00  PM 03/14/15   Aleksei Plotnikov V, MD  carvedilol (COREG) 3.125 MG tablet Take 1 tablet (3.125 mg total) by mouth 2 (two) times daily. 09/05/14   Troy Sine, MD  citalopram (CELEXA) 20 MG tablet Take 1 tablet (20 mg total) by mouth daily. 12/02/14   Aleksei Plotnikov V, MD  fluticasone (FLONASE) 50 MCG/ACT nasal spray Place 1 spray into both nostrils daily as needed for allergies or rhinitis.    Historical Ramiah Helfrich, MD  furosemide (LASIX) 40 MG tablet Take 1 tablet (40 mg total) by mouth daily. Patient taking differently: Take 40 mg by mouth 2 (two) times daily.  12/02/14   Aleksei Plotnikov V, MD  HYDROcodone-acetaminophen (NORCO/VICODIN) 5-325 MG per tablet Take 0.5 tablets by mouth every 4 (four) hours as needed. 04/30/15   Heather Laisure, PA-C  irbesartan (AVAPRO) 75 MG tablet Take 75 mg by mouth daily.    Historical Tamaria Dunleavy, MD  isosorbide mononitrate (IMDUR) 60 MG 24 hr tablet Take 1 tablet (60 mg total) by mouth daily. 07/05/14   Troy Sine, MD  ketoconazole (NIZORAL) 2 % cream Apply 1 application topically daily. Under breasts 02/28/15   Aleksei Plotnikov V, MD  lidocaine (LIDODERM) 5 % Place 1 patch onto the skin once. Remove & Discard patch within 12 hours or as directed by MD 02/11/15   Linton Flemings, MD  metFORMIN (GLUCOPHAGE) 500 MG tablet TAKE 1 TABLET TWICE DAILY BEFORE A MEAL 03/10/15   Aleksei Plotnikov V, MD  omeprazole (PRILOSEC) 20 MG capsule Take 1 capsule (20 mg total) by mouth daily. 12/02/14   Aleksei Plotnikov V, MD  potassium chloride SA (K-DUR,KLOR-CON) 20 MEQ tablet Take 1 tablet by mouth daily. 12/06/14   Historical Agustin Swatek, MD  vitamin B-12 (CYANOCOBALAMIN) 500 MCG tablet Place 500 mcg under the tongue daily.    Historical Sumedh Shinsato, MD   BP 157/55 mmHg  Pulse 67  Temp(Src) 98.1 F (36.7 C) (Oral)  Resp 23  Ht 5\' 5"  (1.651 m)  Wt 172 lb (78.019 kg)  BMI 28.62 kg/m2  SpO2 95% Physical Exam  Constitutional: She is  oriented to person, place, and time. She appears well-developed and well-nourished. No distress.  HENT:  Head: Normocephalic and atraumatic.  Eyes: Conjunctivae and EOM are normal.  Neck: Neck supple. No tracheal deviation present.  Cardiovascular: Normal rate and regular rhythm.   No murmur heard. No obvious murmurs.   Pulmonary/Chest: Effort normal. No respiratory distress. She has rales (few crackles at bases).  Good air movement overall. Sparse crackles at the bases both sides. In no respiratory  distress.  Mild dry mm. Neck is supple.  asnt No significant leg swelling.  Musculoskeletal: Normal range of motion.  Neurological: She is alert and oriented to person, place, and time.  Skin: Skin is warm and dry.  Psychiatric: She has a normal mood and affect. Her behavior is normal.  Nursing note and vitals reviewed.   ED Course  Procedures (including critical care time)  DIAGNOSTIC STUDIES: Oxygen Saturation is 97% on RA, normal by my interpretation.    COORDINATION OF CARE: 10:18 AM - Discussed treatment plan with pt at bedside which includes CXR and blood tests. Pt verbalized understanding and agreed to plan.   Labs Review Labs Reviewed  CBC WITH DIFFERENTIAL/PLATELET - Abnormal; Notable for the following:    RBC 3.75 (*)    Hemoglobin 11.3 (*)    HCT 33.6 (*)    All other components within normal limits  BASIC METABOLIC PANEL - Abnormal; Notable for the following:    Potassium 3.2 (*)    Glucose, Bld 164 (*)    Creatinine, Ser 1.10 (*)    Calcium 8.5 (*)    GFR calc non Af Amer 44 (*)    GFR calc Af Amer 51 (*)    All other components within normal limits  BRAIN NATRIURETIC PEPTIDE - Abnormal; Notable for the following:    B Natriuretic Peptide 424.0 (*)    All other components within normal limits  TROPONIN I    Imaging Review Dg Chest 2 View  05/24/2015   CLINICAL DATA:  Nonproductive cough and increased shortness of breath for 1 week.  EXAM: CHEST  2 VIEW   COMPARISON:  04/28/2015  FINDINGS: The heart is at the upper limits of normal in size. Calcification of the thoracic aorta. Lungs are somewhat hyperinflated with flattening of the diaphragm consistent with emphysema. Slight peribronchial thickening. No consolidative infiltrates or effusions. No acute osseous abnormality. Previous left rotator cuff surgery.  Coronary artery stent in place. Calcification in the mitral valve annulus.  IMPRESSION: Probable emphysema and slight bronchitic changes. Aortic atherosclerosis.   Electronically Signed   By: Lorriane Shire M.D.   On: 05/24/2015 10:52   I have personally reviewed and evaluated these images and lab results as part of my medical decision-making.   EKG Interpretation   Date/Time:  Wednesday May 24 2015 10:26:19 EDT Ventricular Rate:  67 PR Interval:  201 QRS Duration: 117 QT Interval:  452 QTC Calculation: 477 R Axis:   -39 Text Interpretation:  Sinus rhythm LVH with IVCD, LAD and secondary repol  abnrm Confirmed by ZAVITZ  MD, JOSHUA ([id number]) on 05/24/2015 1:52:05 PM      MDM   Final diagnoses:  Cough  Dyspnea  Chronic systolic CHF (congestive heart failure)  Hypokalemia   Patient presented with worsening dyspnea for the past few months. Patient has had weight increase as well. Concern for mild worsening congestive heart failure. Lasix IV given, potassium given as well. Patient felt improved in the ER. Patient not requiring oxygen mild elevated blood pressure. Patient stable for close outpatient follow-up instructed to take an extra Lasix dose the next 2 days.   Results and differential diagnosis were discussed with the patient/parent/guardian. Xrays were independently reviewed by myself.  Close follow up outpatient was discussed, comfortable with the plan.   Medications  ipratropium-albuterol (DUONEB) 0.5-2.5 (3) MG/3ML nebulizer solution 3 mL (3 mLs Nebulization Given 05/24/15 1108)  potassium chloride (KLOR-CON) packet 40 mEq  (40 mEq Oral Given 05/24/15 1241)  furosemide (  LASIX) injection 20 mg (20 mg Intravenous Given 05/24/15 1240)    Filed Vitals:   05/24/15 1100 05/24/15 1108 05/24/15 1130 05/24/15 1230  BP: 172/64  165/55 157/55  Pulse: 56  64 67  Temp:      TempSrc:      Resp: 17  15 23   Height:      Weight:      SpO2: 98% 99% 97% 95%    Final diagnoses:  Cough  Dyspnea  Chronic systolic CHF (congestive heart failure)  Hypokalemia       Elnora Morrison, MD 05/24/15 1400

## 2015-05-31 ENCOUNTER — Ambulatory Visit (INDEPENDENT_AMBULATORY_CARE_PROVIDER_SITE_OTHER): Payer: Commercial Managed Care - HMO | Admitting: Internal Medicine

## 2015-05-31 ENCOUNTER — Encounter: Payer: Self-pay | Admitting: Internal Medicine

## 2015-05-31 ENCOUNTER — Other Ambulatory Visit (INDEPENDENT_AMBULATORY_CARE_PROVIDER_SITE_OTHER): Payer: Commercial Managed Care - HMO

## 2015-05-31 ENCOUNTER — Ambulatory Visit (INDEPENDENT_AMBULATORY_CARE_PROVIDER_SITE_OTHER): Payer: Commercial Managed Care - HMO | Admitting: Cardiovascular Disease

## 2015-05-31 VITALS — BP 142/74 | HR 68 | Ht 65.0 in | Wt 166.7 lb

## 2015-05-31 VITALS — BP 148/78 | HR 75 | Wt 170.0 lb

## 2015-05-31 DIAGNOSIS — Z91148 Patient's other noncompliance with medication regimen for other reason: Secondary | ICD-10-CM

## 2015-05-31 DIAGNOSIS — I10 Essential (primary) hypertension: Secondary | ICD-10-CM

## 2015-05-31 DIAGNOSIS — N183 Chronic kidney disease, stage 3 unspecified: Secondary | ICD-10-CM

## 2015-05-31 DIAGNOSIS — N289 Disorder of kidney and ureter, unspecified: Secondary | ICD-10-CM | POA: Diagnosis not present

## 2015-05-31 DIAGNOSIS — Z23 Encounter for immunization: Secondary | ICD-10-CM

## 2015-05-31 DIAGNOSIS — Z79899 Other long term (current) drug therapy: Secondary | ICD-10-CM

## 2015-05-31 DIAGNOSIS — Z9989 Dependence on other enabling machines and devices: Secondary | ICD-10-CM

## 2015-05-31 DIAGNOSIS — I25810 Atherosclerosis of coronary artery bypass graft(s) without angina pectoris: Secondary | ICD-10-CM | POA: Diagnosis not present

## 2015-05-31 DIAGNOSIS — N189 Chronic kidney disease, unspecified: Secondary | ICD-10-CM

## 2015-05-31 DIAGNOSIS — R29898 Other symptoms and signs involving the musculoskeletal system: Secondary | ICD-10-CM

## 2015-05-31 DIAGNOSIS — I5022 Chronic systolic (congestive) heart failure: Secondary | ICD-10-CM | POA: Diagnosis not present

## 2015-05-31 DIAGNOSIS — Z9114 Patient's other noncompliance with medication regimen: Secondary | ICD-10-CM

## 2015-05-31 DIAGNOSIS — I255 Ischemic cardiomyopathy: Secondary | ICD-10-CM | POA: Diagnosis not present

## 2015-05-31 DIAGNOSIS — R296 Repeated falls: Secondary | ICD-10-CM

## 2015-05-31 DIAGNOSIS — E538 Deficiency of other specified B group vitamins: Secondary | ICD-10-CM | POA: Diagnosis not present

## 2015-05-31 DIAGNOSIS — G4733 Obstructive sleep apnea (adult) (pediatric): Secondary | ICD-10-CM

## 2015-05-31 DIAGNOSIS — Z9111 Patient's noncompliance with dietary regimen: Secondary | ICD-10-CM

## 2015-05-31 LAB — BASIC METABOLIC PANEL
BUN: 26 mg/dL — AB (ref 6–23)
CHLORIDE: 102 meq/L (ref 96–112)
CO2: 33 meq/L — AB (ref 19–32)
CREATININE: 1.66 mg/dL — AB (ref 0.40–1.20)
Calcium: 9.5 mg/dL (ref 8.4–10.5)
GFR: 31.05 mL/min — ABNORMAL LOW (ref >60.00)
Glucose, Bld: 174 mg/dL — ABNORMAL HIGH (ref 70–99)
Potassium: 3.6 mEq/L (ref 3.5–5.1)
Sodium: 142 mEq/L (ref 135–145)

## 2015-05-31 MED ORDER — SACUBITRIL-VALSARTAN 24-26 MG PO TABS: 1.0000 | ORAL_TABLET | Freq: Two times a day (BID) | ORAL | Status: DC

## 2015-05-31 NOTE — Patient Instructions (Signed)
Your physician has recommended you make the following change in your medication:   1.) STOP IRBESARTAN (Avapro)  2.) start  Entresto samples given today. Take 1 tablet twice a day.  Your physician recommends that you return for lab work in: 2 weeks.  Your physician recommends that you schedule a follow-up appointment in: 3 weeks with kristen on a Thursday.  Your physician recommends that you schedule a follow-up appointment in: 2 months with Dr. Claiborne Billings.

## 2015-05-31 NOTE — Progress Notes (Signed)
Subjective:  Patient ID: Mary Cook, female    DOB: 1926-07-01  Age: 81 y.o. MRN: [id number]  CC: No chief complaint on file.   HPI Mary Cook presents for weakness, SOB, ataxia f/u. Pt is not taking all of her meds...  Outpatient Prescriptions Prior to Visit  Medication Sig Dispense Refill  . ACCU-CHEK SOFTCLIX LANCETS lancets     . acetaminophen (TYLENOL) 325 MG tablet Take 2 tablets (650 mg total) by mouth every 4 (four) hours as needed for headache or mild pain.    Marland Kitchen alum & mag hydroxide-simeth (MAALOX/MYLANTA) 200-200-20 MG/5ML suspension Take 30 mLs by mouth every 2 (two) hours as needed for indigestion. 355 mL 0  . aspirin 81 MG EC tablet Take 81 mg by mouth daily.      Marland Kitchen atorvastatin (LIPITOR) 40 MG tablet TAKE 1 TABLET EVERY DAY  AT  6:00  PM 90 tablet 2  . carvedilol (COREG) 3.125 MG tablet Take 1 tablet (3.125 mg total) by mouth 2 (two) times daily. 180 tablet 3  . citalopram (CELEXA) 20 MG tablet Take 1 tablet (20 mg total) by mouth daily. 90 tablet 3  . fluticasone (FLONASE) 50 MCG/ACT nasal spray Place 1 spray into both nostrils daily as needed for allergies or rhinitis.    . furosemide (LASIX) 40 MG tablet Take 1 tablet (40 mg total) by mouth daily. (Patient taking differently: Take 40 mg by mouth 2 (two) times daily. ) 90 tablet 3  . HYDROcodone-acetaminophen (NORCO/VICODIN) 5-325 MG per tablet Take 0.5 tablets by mouth every 4 (four) hours as needed. 6 tablet 0  . isosorbide mononitrate (IMDUR) 60 MG 24 hr tablet Take 1 tablet (60 mg total) by mouth daily. 90 tablet 3  . ketoconazole (NIZORAL) 2 % cream Apply 1 application topically daily. Under breasts 45 g 1  . metFORMIN (GLUCOPHAGE) 500 MG tablet TAKE 1 TABLET TWICE DAILY BEFORE A MEAL 180 tablet 3  . omeprazole (PRILOSEC) 20 MG capsule Take 1 capsule (20 mg total) by mouth daily. 30 capsule 3  . potassium chloride SA (K-DUR,KLOR-CON) 20 MEQ tablet Take 1 tablet by mouth daily.    .  sacubitril-valsartan (ENTRESTO) 24-26 MG Take 1 tablet by mouth 2 (two) times daily. 56 tablet 0  . vitamin B-12 (CYANOCOBALAMIN) 500 MCG tablet Place 500 mcg under the tongue daily.     No facility-administered medications prior to visit.    ROS Review of Systems  Constitutional: Positive for fatigue. Negative for chills, activity change, appetite change and unexpected weight change.  HENT: Negative for congestion, mouth sores and sinus pressure.   Eyes: Negative for visual disturbance.  Respiratory: Negative for cough, chest tightness and shortness of breath.   Gastrointestinal: Positive for constipation. Negative for nausea and abdominal pain.  Genitourinary: Negative for frequency, difficulty urinating and vaginal pain.  Musculoskeletal: Positive for joint swelling and arthralgias. Negative for back pain and gait problem.  Skin: Negative for pallor and rash.  Neurological: Positive for weakness. Negative for dizziness, tremors, numbness and headaches.  Psychiatric/Behavioral: Negative for confusion and sleep disturbance.    Objective:  BP 148/78 mmHg  Pulse 75  Wt 170 lb (77.111 kg)  SpO2 95%  BP Readings from Last 3 Encounters:  05/31/15 148/78  05/31/15 142/74  05/24/15 160/67    Wt Readings from Last 3 Encounters:  05/31/15 170 lb (77.111 kg)  05/31/15 166 lb 11.2 oz (75.615 kg)  05/24/15 172 lb (78.019 kg)    Physical Exam  Constitutional: She appears well-developed. No distress.  HENT:  Head: Normocephalic.  Right Ear: External ear normal.  Left Ear: External ear normal.  Nose: Nose normal.  Mouth/Throat: Oropharynx is clear and moist.  Eyes: Conjunctivae are normal. Pupils are equal, round, and reactive to light. Right eye exhibits no discharge. Left eye exhibits no discharge.  Neck: Normal range of motion. Neck supple. No JVD present. No tracheal deviation present. No thyromegaly present.  Cardiovascular: Normal rate, regular rhythm and normal heart sounds.    Pulmonary/Chest: No stridor. No respiratory distress. She has no wheezes.  Abdominal: Soft. Bowel sounds are normal. She exhibits no distension and no mass. There is no tenderness. There is no rebound and no guarding.  Musculoskeletal: She exhibits no edema or tenderness.  Lymphadenopathy:    She has no cervical adenopathy.  Neurological: She displays normal reflexes. No cranial nerve deficit. She exhibits normal muscle tone. Coordination abnormal.  Skin: No rash noted. No erythema.  Psychiatric: Judgment and thought content normal.  Using a cane Weak thighs Flat affect Alert, cooperative  Lab Results  Component Value Date   WBC 5.0 05/24/2015   HGB 11.3* 05/24/2015   HCT 33.6* 05/24/2015   PLT 159 05/24/2015   GLUCOSE 164* 05/24/2015   CHOL 165 09/05/2014   TRIG 93 09/05/2014   HDL 70 09/05/2014   LDLDIRECT 180.7 12/09/2011   LDLCALC 76 09/05/2014   ALT 13* 01/30/2015   AST 16 01/30/2015   NA 138 05/24/2015   K 3.2* 05/24/2015   CL 105 05/24/2015   CREATININE 1.10* 05/24/2015   BUN 18 05/24/2015   CO2 26 05/24/2015   TSH 1.56 10/12/2014   INR 0.97 06/08/2014   HGBA1C 7.1* 10/12/2014    Dg Chest 2 View  05/24/2015   CLINICAL DATA:  Nonproductive cough and increased shortness of breath for 1 week.  EXAM: CHEST  2 VIEW  COMPARISON:  04/28/2015  FINDINGS: The heart is at the upper limits of normal in size. Calcification of the thoracic aorta. Lungs are somewhat hyperinflated with flattening of the diaphragm consistent with emphysema. Slight peribronchial thickening. No consolidative infiltrates or effusions. No acute osseous abnormality. Previous left rotator cuff surgery.  Coronary artery stent in place. Calcification in the mitral valve annulus.  IMPRESSION: Probable emphysema and slight bronchitic changes. Aortic atherosclerosis.   Electronically Signed   By: Mary Cook M.D.   On: 05/24/2015 10:52    Assessment & Plan:   There are no diagnoses linked to this  encounter. I am having [name] maintain her aspirin, fluticasone, isosorbide mononitrate, carvedilol, ACCU-CHEK SOFTCLIX LANCETS, furosemide, omeprazole, citalopram, acetaminophen, alum & mag hydroxide-simeth, potassium chloride SA, ketoconazole, metFORMIN, atorvastatin, HYDROcodone-acetaminophen, vitamin B-12, and sacubitril-valsartan.  No orders of the defined types were placed in this encounter.     Follow-up: No Follow-up on file.  Walker Kehr, MD

## 2015-05-31 NOTE — Assessment & Plan Note (Signed)
- 

## 2015-05-31 NOTE — Assessment & Plan Note (Signed)
On Coreg, Avapro, Lasix Risks associated with treatment noncompliance were discussed. Compliance was encouraged.

## 2015-05-31 NOTE — Progress Notes (Signed)
Pre visit review using our clinic review tool, if applicable. No additional management support is needed unless otherwise documented below in the visit note. 

## 2015-05-31 NOTE — Assessment & Plan Note (Signed)
Monitor labs 

## 2015-05-31 NOTE — Assessment & Plan Note (Signed)
On [name] Risks associated with treatment noncompliance were discussed. Compliance was encouraged.

## 2015-05-31 NOTE — Assessment & Plan Note (Signed)
Last fall - June 2016

## 2015-05-31 NOTE — Assessment & Plan Note (Signed)
Discussed.

## 2015-06-02 ENCOUNTER — Encounter: Payer: Self-pay | Admitting: Cardiovascular Disease

## 2015-06-02 NOTE — Progress Notes (Signed)
Patient ID: Mary Cook, Cook   DOB: [birth year], 81 y.o.   MRN: [id number]     HPI: Mary Cook who presents to the office today for a 9 month follow up cardiology evaluation.  She was recently hospitalized with CHF.  Mary Cook has known CAD and in September 2011 suffered a large out of hospital anterior wall myocardial infarction, complicated by CHF and post MI pericarditis.  At that time, she had significant thrombus burden requiring thrombectomy.  She underwent stenting of her left circumflex coronary artery with tandem 3.015 mm Promus stents postdilated 3.5 mm meters.  She had concomitant CAD involving her diagonal branch of her LAD as well as RCA.  Last year she was readmitted to the hospital with increasing shortness of breath.  A nuclear study which demonstrated inferior apical ischemia. On 06/08/2014,  repeat cardiac catheterization by me showed moderate LV dysfunction with global hypokinesis and slightly more pronounced inferior hypocontractility.  Ejection fraction was 35%.  There was moderate CAD with 50-60% smooth eccentric stenosis in the first diagonal branch of the LAD, widely patent proximal tandem stents in the circumflex vessel with a focal 70% stenosis of a small AV groove circumflex, and 50-60% stenosis in the proximal RCA with mild mid systolic bridging in a dominant RCA system.  Increased medical therapy was recommended.  She also has a history of obstructive sleep apnea and had been using CPAP therapy.  Presently, she admits to 100% compliance.  She is unaware of breakthrough snoring.  She is diabetic on metformin 500 mg twice a day.  She also has a history of hyperlipidemia for which she has been taking atorvastatin 40 mg.  There is a history of hypertension for which she has been on furosemide 40 mg in addition to her isosorbide.  She  sustained a fall and was evaluated in the emergency room by Dr. Fuller Song in October 2015.  CT of her head  was negative, although she had chronic white matter ischemic change and diffuse severe degenerative changes.  She was also noted to have carotid atherosclerotic vascular disease.  Since I last saw her in the office in January 2016, she has had several admissions to the hospital with acute on chronic combined systolic and diastolic heart failure.  She's felt have an ejection fraction of 30-35%.  Her last admission was in September.  She presents now for evaluation.  Past Medical History  Diagnosis Date  . Essential hypertension   . Osteoarthritis   . Osteopenia   . Diverticulosis of colon   . Type II diabetes mellitus ([place])   . CAD (coronary artery disease)     a. 04/2010 MI DES x 2 to LCX;  b. 05/2014 Cath: EF 35%, patent LCX stents w/ 70% stenosis in small AV groove LCX, Diag 50-60, RCA 50-60p->Med Rx.  Marland Kitchen Hyperlipidemia   . GIB (gastrointestinal bleeding)     a. 2002 Diverticular bleed.  . Skin disorder   . Visual disorder   . Thrombus   . Sleep apnea     cpap therapy  . Frequent urination   . Cerebral aneurysm   . Stroke ([place]) 2002  . Chronic systolic CHF (congestive heart failure) ([place])     a. 01/2015 Echo: EF 30-35%, sev inflat HK, Gr 1DD, mild AI.  . Ischemic cardiomyopathy     a. 01/2015 Echo: EF 30-35%.  . T12 compression fracture ([place])     a. 04/2015.    Past  Surgical History  Procedure Laterality Date  . Abdominal hysterectomy  1980    no bso  . Coronary angioplasty with stent placement  05/11/2010    stent CFX  . Cardiac catheterization  07/2011    LAD OK, D1 80%, CFX 30% w/ patent stent, RCA 50-60%, EF 40%  . Thrombectomy    . Cardiac catheterization    . Doppler echocardiography    . Myocardial perfusion study    . Left heart catheterization with coronary angiogram N/A 08/18/2011    Procedure: LEFT HEART CATHETERIZATION WITH CORONARY ANGIOGRAM;  Surgeon: Wellington Hampshire, MD;  Location: [facility] CATH LAB;  Service: Cardiovascular;  Laterality: N/A;  . Left heart  catheterization with coronary angiogram N/A 06/08/2014    Procedure: LEFT HEART CATHETERIZATION WITH CORONARY ANGIOGRAM;  Surgeon: Troy Sine, MD;  Location[facility] CATH [facility];  Service: Cardiovascular;  Laterality: N/A;  . Tonsillectomy    . Appendectomy      Allergies  Allergen Reactions  . Clopidogrel Bisulfate Nausea And Vomiting    Patient is not aware of this allergy  . Lipitor [Atorvastatin] Other (See Comments)    Weak legs  . Lisinopril Cough    Patient is not aware of this allergy    Current Outpatient Prescriptions  Medication Sig Dispense Refill  . ACCU-CHEK SOFTCLIX LANCETS lancets     . acetaminophen (TYLENOL) 325 MG tablet Take 2 tablets (650 mg total) by mouth every 4 (four) hours as needed for headache or mild pain.    Marland Kitchen alum & mag hydroxide-simeth (MAALOX/MYLANTA) 200-200-20 MG/5ML suspension Take 30 mLs by mouth every 2 (two) hours as needed for indigestion. 355 mL 0  . aspirin 81 MG EC tablet Take 81 mg by mouth daily.      . carvedilol (COREG) 3.125 MG tablet Take 1 tablet (3.125 mg total) by mouth 2 (two) times daily. 180 tablet 3  . citalopram (CELEXA) 20 MG tablet Take 1 tablet (20 mg total) by mouth daily. 90 tablet 3  . fluticasone (FLONASE) 50 MCG/ACT nasal spray Place 1 spray into both nostrils daily as needed for allergies or rhinitis.    . furosemide (LASIX) 40 MG tablet Take 1 tablet (40 mg total) by mouth daily. (Patient taking differently: Take 40 mg by mouth 2 (two) times daily. ) 90 tablet 3  . HYDROcodone-acetaminophen (NORCO/VICODIN) 5-325 MG per tablet Take 0.5 tablets by mouth every 4 (four) hours as needed. 6 tablet 0  . isosorbide mononitrate (IMDUR) 60 MG 24 hr tablet Take 1 tablet (60 mg total) by mouth daily. 90 tablet 3  . ketoconazole (NIZORAL) 2 % cream Apply 1 application topically daily. Under breasts 45 g 1  . metFORMIN (GLUCOPHAGE) 500 MG tablet TAKE 1 TABLET TWICE DAILY BEFORE A MEAL 180 tablet 3  . omeprazole (PRILOSEC) 20 MG capsule  Take 1 capsule (20 mg total) by mouth daily. 30 capsule 3  . potassium chloride SA (K-DUR,KLOR-CON) 20 MEQ tablet Take 1 tablet by mouth daily.    . vitamin B-12 (CYANOCOBALAMIN) 500 MCG tablet Place 500 mcg under the tongue daily.    . sacubitril-valsartan (ENTRESTO) 24-26 MG Take 1 tablet by mouth 2 (two) times daily. 56 tablet 0   No current facility-administered medications for this visit.    Social History   Social History  . Marital Status: Married    Spouse Name: N/A  . Number of Children: N/A  . Years of Education: N/A   Occupational History  . Retired  Social History Main Topics  . Smoking status: Never Smoker   . Smokeless tobacco: Never Used  . Alcohol Use: No  . Drug Use: No  . Sexual Activity: Not on file   Other Topics Concern  . Not on file   Social History Narrative    Family History  Problem Relation Age of Onset  . Hypertension Other   . CAD      father    ROS General: Negative; No fevers, chills, or night sweats HEENT: Negative; No changes in vision or hearing, sinus congestion, difficulty swallowing Pulmonary: Negative; No cough, wheezing, shortness of breath, hemoptysis Cardiovascular: See HPI:  GI: positive for GERD; No nausea, vomiting, diarrhea, or abdominal pain GU: Negative; No dysuria, hematuria, or difficulty voiding Musculoskeletal: degenerative changes to her cervical spine.  Arthritis;  Hematologic: Negative; no easy bruising, bleeding Endocrine: positive for diabetes mellitus Neuro: Negative; no changes in balance, headaches Skin: Negative; No rashes or skin lesions Psychiatric: Negative; No behavioral problems, depression Sleep: Negative; No snoring,  daytime sleepiness, hypersomnolence, bruxism, restless legs, hypnogognic hallucinations. Other comprehensive 14 point system review is negative   Physical Exam BP 142/74 mmHg  Pulse 68  Ht 5' 5" (1.651 m)  Wt 166 lb 11.2 oz (75.615 kg)  BMI 27.74 kg/m2   Wt Readings from  Last 3 Encounters:  05/31/15 170 lb (77.111 kg)  05/31/15 166 lb 11.2 oz (75.615 kg)  05/24/15 172 lb (78.019 kg)   General: Alert, oriented, no distress.  Skin: normal turgor, no rashes, warm and dry HEENT: Normocephalic, atraumatic. Pupils equal round and reactive to light; sclera anicteric; extraocular muscles intact, No lid lag; Nose without nasal septal hypertrophy; Mouth/Parynx benign; Mallinpatti scale 3 Neck: No JVD, no carotid bruits; normal carotid upstroke Lungs: clear to ausculatation and percussion bilaterally; no wheezing or rales, normal inspiratory and expiratory effort Chest wall: without tenderness to palpitation Heart: PMI not displaced, RRR, s1 s2 normal, 1/6 systolic murmur, No diastolic murmur, no rubs, gallops, thrills, or heaves Abdomen: soft, nontender; no hepatosplenomehaly, BS+; abdominal aorta nontender and not dilated by palpation. Back: no CVA tenderness Pulses: 2+  Musculoskeletal: full range of motion, normal strength, no joint deformities Extremities: Pulses 2+, no clubbing cyanosis or edema, Homan's sign negative  Neurologic: grossly nonfocal; Cranial nerves grossly wnl Psychologic: Normal mood and affect  ECG (independently read by me): Normal sinus rhythm at 68 bpm.  H by voltage.  T-wave abnormality in leads 1 and aVL.  January 2016 ECG (independently read by me): Normal sinus rhythm at 68 bpm.  LVH by voltage in aVL.  Nonspecific T changes.  November 2015 ECG (independently read by me): sinus bradycardia 59 bpm.  LVH with repolarization changes.  Nonspecific T changes.   LABS: BMP Latest Ref Rng 05/31/2015 05/24/2015 04/28/2015  Glucose 70 - 99 mg/dL 174(H) 164(H) 170(H)  BUN 6 - 23 mg/dL 26(H) 18 27(H)  Creatinine 0.40 - 1.20 mg/dL 1.66(H) 1.10(H) 1.51(H)  Sodium 135 - 145 mEq/L 142 138 140  Potassium 3.5 - 5.1 mEq/L 3.6 3.2(L) 3.5  Chloride 96 - 112 mEq/L 102 105 106  CO2 19 - 32 mEq/L 33(H) 26 27  Calcium 8.4 - 10.5 mg/dL 9.5 8.5(L) 9.2    Hepatic Function Latest Ref Rng 01/30/2015 10/12/2014 09/05/2014  Total Protein 6.5 - 8.1 g/dL 6.6 7.0 6.5  Albumin 3.5 - 5.0 g/dL 3.3(L) 3.7 3.5  AST 15 - 41 U/L _0 ALT 14 - 54 U/L 13(L) 10 12  Alk  Phosphatase 38 - 126 U/L 76 81 95  Total Bilirubin 0.3 - 1.2 mg/dL 0.6 0.6 0.7  Bilirubin, Direct 0.0 - 0.3 mg/dL - 0.1 -   CBC Latest Ref Rng 05/24/2015 04/28/2015 04/18/2015  WBC 4.0 - 10.5 K/uL 5.0 5.4 5.1  Hemoglobin 12.0 - 15.0 g/dL 11.3(L) 11.8(L) 11.8(L)  Hematocrit 36.0 - 46.0 % 33.6(L) 35.9(L) 35.8(L)  Platelets 150 - 400 K/uL 159 178 185   Lab Results  Component Value Date   MCV 89.6 05/24/2015   MCV 92.1 04/28/2015   MCV 91.3 04/18/2015   Lab Results  Component Value Date   TSH 1.56 10/12/2014   Lab Results  Component Value Date   HGBA1C 7.1* 10/12/2014   BNP (last 3 results)  Recent Labs  01/30/15 1208 04/18/15 1200 05/24/15 1018  BNP 508.3* 529.8* 424.0*    ProBNP (last 3 results) No results for input(s): PROBNP in the last 8760 hours.  Lipid Panel     Component Value Date/Time   CHOL 165 09/05/2014 1218   TRIG 93 09/05/2014 1218   HDL 70 09/05/2014 1218   CHOLHDL 2.4 09/05/2014 1218   VLDL 19 09/05/2014 1218   LDLCALC 76 09/05/2014 1218   LDLDIRECT 180.7 12/09/2011 1109    RADIOLOGY: Dg Chest 2 View  05/24/2015   CLINICAL DATA:  Nonproductive cough and increased shortness of breath for 1 week.  EXAM: CHEST  2 VIEW  COMPARISON:  04/28/2015  FINDINGS: The heart is at the upper limits of normal in size. Calcification of the thoracic aorta. Lungs are somewhat hyperinflated with flattening of the diaphragm consistent with emphysema. Slight peribronchial thickening. No consolidative infiltrates or effusions. No acute osseous abnormality. Previous left rotator cuff surgery.  Coronary artery stent in place. Calcification in the mitral valve annulus.  IMPRESSION: Probable emphysema and slight bronchitic changes. Aortic atherosclerosis.   Electronically  Signed   By: Lorriane Shire M.D.   On: 05/24/2015 10:52      ASSESSMENT AND PLAN: Mrs. Woodyard  is an 81year-old Cook who suffered a left circumflex MI in 2011 and underwent tandem stenting of her proximal circumflex vessel.  Cardiac catheterization on 06/08/2014 showed moderate LV dysfunction with moderate multivessel CAD but widely patent tandem stents in the circumflex vessel.  She has been on medical therapy.  An echo Doppler study in June 2016 showed an ejection fraction at 30-35% with severe LVH.  There was grade 1 diastolic dysfunction and she had abnormal tissue Doppler suggesting high ventricular filling pressure.  Was mild aortic insufficiency and mitral annular calcification.  She has had several readmissions with acute on chronic combined systolic and diastolic heart failure.  Presently, she has been on carvedilol 3.125 mg twice a day, Lasix 40 mg daily, irbesartan 75 mg daily, and isosorbide mononitrate 60 mg.  She denies any anginal symptomatology.  With her recurrent admissions with CHF with elevated BNP and reduced LV function, I feel she may be a good candidate to initiate Entresto , which is combination sacubitrill/valsartan.  I have recommended that she discontinue her irbesartan.  After 36 hours, she will then initiate Entresto at the lowest dose 24/26 twice a day.  In approximately 3-4 weeks, I am scheduling her to see [name] our Pharm.D., and if she is doing well and tolerating this therapy with no adverse effects on renal function, her dose will then be titrated to 49/51 mg twice a day.  I am recommending that blood work be obtained in 2 weeks. I will see her  in 2 months for reevaluation.  Time spent: 25 minutes  Troy Sine, MD, [facility]  06/02/2015 6:09 PM

## 2015-06-13 ENCOUNTER — Telehealth: Payer: Self-pay | Admitting: Internal Medicine

## 2015-06-13 ENCOUNTER — Other Ambulatory Visit: Payer: Commercial Managed Care - HMO

## 2015-06-13 NOTE — Telephone Encounter (Signed)
Please follow up with patient in regards to having labs done this week.  Patient came in stating Dr. Alain Marion was wanting her back this week for a lab.

## 2015-06-16 NOTE — Telephone Encounter (Signed)
I called pt to get more clarification.  I do not see where PCP wanted her to come in this week for labs. No answer. Left mess for patient to call back.

## 2015-06-19 ENCOUNTER — Telehealth: Payer: Self-pay | Admitting: Cardiovascular Disease

## 2015-06-19 DIAGNOSIS — Z79899 Other long term (current) drug therapy: Secondary | ICD-10-CM | POA: Diagnosis not present

## 2015-06-19 NOTE — Telephone Encounter (Signed)
Call handled.  All questions addressed. Provided information for local [facility] lab and have them call us and have me paged if there is an issue w/ order.  Understanding verbalized.

## 2015-06-19 NOTE — Telephone Encounter (Signed)
Mrs.Coakley went to the lab this morning and there were no orders for the lab work and [facility] lab stated that there was nothibng they could do . Please have orders sent . Please call if you have any more questions . Thanks

## 2015-06-20 LAB — COMPREHENSIVE METABOLIC PANEL WITH GFR
ALT: 9 U/L (ref 6–29)
AST: 13 U/L (ref 10–35)
Albumin: 3.6 g/dL (ref 3.6–5.1)
Alkaline Phosphatase: 84 U/L (ref 33–130)
BUN: 16 mg/dL (ref 7–25)
CO2: 27 mmol/L (ref 20–31)
Calcium: 9.3 mg/dL (ref 8.6–10.4)
Chloride: 101 mmol/L (ref 98–110)
Creat: 1.38 mg/dL — ABNORMAL HIGH (ref 0.60–0.88)
Glucose, Bld: 266 mg/dL — ABNORMAL HIGH (ref 65–99)
Potassium: 4.2 mmol/L (ref 3.5–5.3)
Sodium: 138 mmol/L (ref 135–146)
Total Bilirubin: 0.8 mg/dL (ref 0.2–1.2)
Total Protein: 6.7 g/dL (ref 6.1–8.1)

## 2015-06-22 ENCOUNTER — Ambulatory Visit: Payer: Commercial Managed Care - HMO | Admitting: Pharmacist Clinician (PhC)/ Clinical Pharmacy Specialist

## 2015-07-03 ENCOUNTER — Ambulatory Visit: Payer: Commercial Managed Care - HMO | Admitting: Cardiovascular Disease

## 2015-07-27 ENCOUNTER — Encounter: Payer: Self-pay | Admitting: Cardiovascular Disease

## 2015-07-27 ENCOUNTER — Ambulatory Visit (INDEPENDENT_AMBULATORY_CARE_PROVIDER_SITE_OTHER): Payer: Commercial Managed Care - HMO | Admitting: Cardiovascular Disease

## 2015-07-27 VITALS — BP 128/70 | HR 73 | Ht 65.0 in | Wt 170.0 lb

## 2015-07-27 DIAGNOSIS — I10 Essential (primary) hypertension: Secondary | ICD-10-CM | POA: Diagnosis not present

## 2015-07-27 DIAGNOSIS — I255 Ischemic cardiomyopathy: Secondary | ICD-10-CM | POA: Diagnosis not present

## 2015-07-27 DIAGNOSIS — Z79899 Other long term (current) drug therapy: Secondary | ICD-10-CM

## 2015-07-27 DIAGNOSIS — N183 Chronic kidney disease, stage 3 unspecified: Secondary | ICD-10-CM

## 2015-07-27 DIAGNOSIS — I5043 Acute on chronic combined systolic (congestive) and diastolic (congestive) heart failure: Secondary | ICD-10-CM

## 2015-07-27 MED ORDER — SACUBITRIL-VALSARTAN 49-51 MG PO TABS: 1.0000 | ORAL_TABLET | Freq: Two times a day (BID) | ORAL | Status: DC

## 2015-07-27 NOTE — Progress Notes (Signed)
Patient ID: Mary Cook, female   DOB: 1926/05/23, 81 y.o.   MRN: [id number]     HPI: Mary Cook is a 81 y.o. female who presents to the office today for a 9 month follow up cardiology evaluation.  She was recently hospitalized with CHF.  [name] has known CAD and in September 2011 suffered a large out of hospital anterior wall myocardial infarction, complicated by CHF and post MI pericarditis.  At that time, she had significant thrombus burden requiring thrombectomy.  She underwent stenting of her left circumflex coronary artery with tandem 3.015 mm Promus stents postdilated 3.5 mm meters.  She had concomitant CAD involving her diagonal branch of her LAD as well as RCA.  Last year she was readmitted to the hospital with increasing shortness of breath.  A nuclear study which demonstrated inferior apical ischemia. On 06/08/2014,  repeat cardiac catheterization by me showed moderate LV dysfunction with global hypokinesis and slightly more pronounced inferior hypocontractility.  Ejection fraction was 35%.  There was moderate CAD with 50-60% smooth eccentric stenosis in the first diagonal branch of the LAD, widely patent proximal tandem stents in the circumflex vessel with a focal 70% stenosis of a small AV groove circumflex, and 50-60% stenosis in the proximal RCA with mild mid systolic bridging in a dominant RCA system.  Increased medical therapy was recommended.  She also has a history of obstructive sleep apnea and had been using CPAP therapy.  Presently, she admits to 100% compliance.  She is unaware of breakthrough snoring.  She is diabetic on metformin 500 mg twice a day.  She also has a history of hyperlipidemia for which she has been taking atorvastatin 40 mg.  There is a history of hypertension for which she has been on furosemide 40 mg in addition to her isosorbide.  She  sustained a fall and was evaluated in the emergency room by Dr. Fuller Song in October 2015.  CT of her head  was negative, although she had chronic white matter ischemic change and diffuse severe degenerative changes.  She was also noted to have carotid atherosclerotic vascular disease.  When  I had last seen her,, she has had several admissions to the hospital with acute on chronic combined systolic and diastolic heart failure.  She's felt have an ejection fraction of 30-35%.   At that time, I elected to add [facility] and provided her with 2 weeks of samples at 24/26 twice a day.  Subsequent blood work revealed her creatinine actually had improved to 1.38 from 1.66.  She was supposed to see York Pellant.D. 2 weeks after initiation of therapy for dose titration up to 49/51, but she never followed up with this.  As result, she ran out of her samples and has not been on any therapy for over a month. Retrospectively, she  Felt that she noted improvement with initiation of Entresto  She denies PND, orthopnea.  She denies palpitations.  She presents for evaluation.  Past Medical History  Diagnosis Date  . Essential hypertension   . Osteoarthritis   . Osteopenia   . Diverticulosis of colon   . Type II diabetes mellitus ([place])   . CAD (coronary artery disease)     a. 04/2010 MI DES x 2 to LCX;  b. 05/2014 Cath: EF 35%, patent LCX stents w/ 70% stenosis in small AV groove LCX, Diag 50-60, RCA 50-60p->Med Rx.  Marland Kitchen Hyperlipidemia   . GIB (gastrointestinal bleeding)     a. 2002 Diverticular bleed.  Marland Kitchen  Skin disorder   . Visual disorder   . Thrombus   . Sleep apnea     cpap therapy  . Frequent urination   . Cerebral aneurysm   . Stroke ([place]) 2002  . Chronic systolic CHF (congestive heart failure) ([place])     a. 01/2015 Echo: EF 30-35%, sev inflat HK, Gr 1DD, mild AI.  . Ischemic cardiomyopathy     a. 01/2015 Echo: EF 30-35%.  . T12 compression fracture ([place])     a. 04/2015.    Past Surgical History  Procedure Laterality Date  . Abdominal hysterectomy  1980    no bso  . Coronary angioplasty with stent  placement  05/11/2010    stent CFX  . Cardiac catheterization  07/2011    LAD OK, D1 80%, CFX 30% w/ patent stent, RCA 50-60%, EF 40%  . Thrombectomy    . Cardiac catheterization    . Doppler echocardiography    . Myocardial perfusion study    . Left heart catheterization with coronary angiogram N/A 08/18/2011    Procedure: LEFT HEART CATHETERIZATION WITH CORONARY ANGIOGRAM;  Surgeon: Wellington Hampshire, MD;  Location: [place] CATH LAB;  Service: Cardiovascular;  Laterality: N/A;  . Left heart catheterization with coronary angiogram N/A 06/08/2014    Procedure: LEFT HEART CATHETERIZATION WITH CORONARY ANGIOGRAM;  Surgeon: Troy Sine, MD;  Location: [facility] CATH LAB;  Service: Cardiovascular;  Laterality: N/A;  . Tonsillectomy    . Appendectomy      Allergies  Allergen Reactions  . Clopidogrel Bisulfate Nausea And Vomiting    Patient is not aware of this allergy  . Lipitor [Atorvastatin] Other (See Comments)    Weak legs  . Lisinopril Cough    Patient is not aware of this allergy    Current Outpatient Prescriptions  Medication Sig Dispense Refill  . ACCU-CHEK SOFTCLIX LANCETS lancets     . acetaminophen (TYLENOL) 325 MG tablet Take 2 tablets (650 mg total) by mouth every 4 (four) hours as needed for headache or mild pain.    . carvedilol (COREG) 3.125 MG tablet Take 1 tablet (3.125 mg total) by mouth 2 (two) times daily. 180 tablet 3  . citalopram (CELEXA) 20 MG tablet Take 1 tablet (20 mg total) by mouth daily. 90 tablet 3  . furosemide (LASIX) 40 MG tablet Take 1 tablet (40 mg total) by mouth daily. (Patient taking differently: Take 40 mg by mouth 2 (two) times daily. ) 90 tablet 3  . ketoconazole (NIZORAL) 2 % cream Apply 1 application topically daily. Under breasts 45 g 1  . metFORMIN (GLUCOPHAGE) 500 MG tablet TAKE 1 TABLET TWICE DAILY BEFORE A MEAL 180 tablet 3  . omeprazole (PRILOSEC) 20 MG capsule Take 1 capsule (20 mg total) by mouth daily. 30 capsule 3  . potassium chloride SA  (K-DUR,KLOR-CON) 20 MEQ tablet Take 1 tablet by mouth daily.    . vitamin B-12 (CYANOCOBALAMIN) 500 MCG tablet Place 500 mcg under the tongue daily.    Marland Kitchen alum & mag hydroxide-simeth (MAALOX/MYLANTA) 200-200-20 MG/5ML suspension Take 30 mLs by mouth every 2 (two) hours as needed for indigestion. (Patient not taking: Reported on 07/27/2015) 355 mL 0  . aspirin 81 MG EC tablet Take 81 mg by mouth daily.      . fluticasone (FLONASE) 50 MCG/ACT nasal spray Place 1 spray into both nostrils daily as needed for allergies or rhinitis.    Marland Kitchen HYDROcodone-acetaminophen (NORCO/VICODIN) 5-325 MG per tablet Take 0.5 tablets by mouth every 4 (  four) hours as needed. (Patient not taking: Reported on 07/27/2015) 6 tablet 0  . isosorbide mononitrate (IMDUR) 60 MG 24 hr tablet Take 1 tablet (60 mg total) by mouth daily. (Patient not taking: Reported on 07/27/2015) 90 tablet 3  . sacubitril-valsartan (ENTRESTO) 49-51 MG Take 1 tablet by mouth 2 (two) times daily. 180 tablet 3   No current facility-administered medications for this visit.    Social History   Social History  . Marital Status: Married    Spouse Name: N/A  . Number of Children: N/A  . Years of Education: N/A   Occupational History  . Retired    Social History Main Topics  . Smoking status: Never Smoker   . Smokeless tobacco: Never Used  . Alcohol Use: No  . Drug Use: No  . Sexual Activity: Not on file   Other Topics Concern  . Not on file   Social History Narrative    Family History  Problem Relation Age of Onset  . Hypertension Other   . CAD      father    ROS General: Negative; No fevers, chills, or night sweats HEENT: Negative; No changes in vision or hearing, sinus congestion, difficulty swallowing Pulmonary: Negative; No cough, wheezing, shortness of breath, hemoptysis Cardiovascular: See HPI:  GI: positive for GERD; No nausea, vomiting, diarrhea, or abdominal pain GU: Negative; No dysuria, hematuria, or difficulty  voiding Musculoskeletal: degenerative changes to her cervical spine.  Arthritis;  Hematologic: Negative; no easy bruising, bleeding Endocrine: positive for diabetes mellitus Neuro: Negative; no changes in balance, headaches Skin: Negative; No rashes or skin lesions Psychiatric: Negative; No behavioral problems, depression Sleep: Negative; No snoring,  daytime sleepiness, hypersomnolence, bruxism, restless legs, hypnogognic hallucinations. Other comprehensive 14 point system review is negative   Physical Exam BP 128/70 mmHg  Pulse 73  Ht _0  (1.651 m)  Wt 170 lb (77.111 kg)  BMI 28.29 kg/m2   Wt Readings from Last 3 Encounters:  07/27/15 170 lb (77.111 kg)  05/31/15 170 lb (77.111 kg)  05/31/15 166 lb 11.2 oz (75.615 kg)   General: Alert, oriented, no distress.  Skin: normal turgor, no rashes, warm and dry HEENT: Normocephalic, atraumatic. Pupils equal round and reactive to light; sclera anicteric; extraocular muscles intact, No lid lag; Nose without nasal septal hypertrophy; Mouth/Parynx benign; Mallinpatti scale 3 Neck: No JVD, no carotid bruits; normal carotid upstroke Lungs: clear to ausculatation and percussion bilaterally; no wheezing or rales, normal inspiratory and expiratory effort Chest wall: without tenderness to palpitation Heart: PMI not displaced, RRR, s1 s2 normal, 1/6 systolic murmur, No diastolic murmur, no rubs, gallops, thrills, or heaves Abdomen: soft, nontender; no hepatosplenomehaly, BS+; abdominal aorta nontender and not dilated by palpation. Back: no CVA tenderness Pulses: 2+  Musculoskeletal: full range of motion, normal strength, no joint deformities Extremities: Pulses 2+, no clubbing cyanosis or edema, Homan's sign negative  Neurologic: grossly nonfocal; Cranial nerves grossly wnl Psychologic: Normal mood and affect  ECG (independently read by me):  Normal sinus rhythm at 73 bpm.  LVH.  Nonspecific T changes.  ECG (independently read by me): Normal  sinus rhythm at 68 bpm.  H by voltage.  T-wave abnormality in leads 1 and aVL.  January 2016 ECG (independently read by me): Normal sinus rhythm at 68 bpm.  LVH by voltage in aVL.  Nonspecific T changes.  November 2015 ECG (independently read by me): sinus bradycardia 59 bpm.  LVH with repolarization changes.  Nonspecific T changes.   LABS: BMP  Latest Ref Rng 06/19/2015 05/31/2015 05/24/2015  Glucose 65 - 99 mg/dL 266(H) 174(H) 164(H)  BUN 7 - 25 mg/dL 16 26(H) 18  Creatinine 0.60 - 0.88 mg/dL 1.38(H) 1.66(H) 1.10(H)  Sodium 135 - 146 mmol/L 138 142 138  Potassium 3.5 - 5.3 mmol/L 4.2 3.6 3.2(L)  Chloride 98 - 110 mmol/L 101 102 105  CO2 20 - 31 mmol/L 27 33(H) 26  Calcium 8.6 - 10.4 mg/dL 9.3 9.5 8.5(L)   Hepatic Function Latest Ref Rng 06/19/2015 01/30/2015 10/12/2014  Total Protein 6.1 - 8.1 g/dL 6.7 6.6 7.0  Albumin 3.6 - 5.1 g/dL 3.6 3.3(L) 3.7  AST 10 - 35 U/L _0 ALT 6 - 29 U/L 9 13(L) 10  Alk Phosphatase 33 - 130 U/L 84 76 81  Total Bilirubin 0.2 - 1.2 mg/dL 0.8 0.6 0.6  Bilirubin, Direct 0.0 - 0.3 mg/dL - - 0.1   CBC Latest Ref Rng 05/24/2015 04/28/2015 04/18/2015  WBC 4.0 - 10.5 K/uL 5.0 5.4 5.1  Hemoglobin 12.0 - 15.0 g/dL 11.3(L) 11.8(L) 11.8(L)  Hematocrit 36.0 - 46.0 % 33.6(L) 35.9(L) 35.8(L)  Platelets 150 - 400 K/uL 159 178 185   Lab Results  Component Value Date   MCV 89.6 05/24/2015   MCV 92.1 04/28/2015   MCV 91.3 04/18/2015   Lab Results  Component Value Date   TSH 1.56 10/12/2014   Lab Results  Component Value Date   HGBA1C 7.1* 10/12/2014   BNP (last 3 results)  Recent Labs  01/30/15 1208 04/18/15 1200 05/24/15 1018  BNP 508.3* 529.8* 424.0*    ProBNP (last 3 results) No results for input(s): PROBNP in the last 8760 hours.  Lipid Panel     Component Value Date/Time   CHOL 165 09/05/2014 1218   TRIG 93 09/05/2014 1218   HDL 70 09/05/2014 1218   CHOLHDL 2.4 09/05/2014 1218   VLDL 19 09/05/2014 1218   LDLCALC 76 09/05/2014 1218    LDLDIRECT 180.7 12/09/2011 1109    RADIOLOGY: No results found.    ASSESSMENT AND PLAN: Mrs. Tello  is an 81year-old female who suffered a left circumflex MI in 2011 and underwent tandem stenting of her proximal circumflex vessel.  Cardiac catheterization on 06/08/2014 showed moderate LV dysfunction with moderate multivessel CAD but widely patent tandem stents in the circumflex vessel.  An echo Doppler study in June 2016 showed an ejection fraction at 30-35% with severe LVH.  There was grade 1 diastolic dysfunction and she had abnormal tissue Doppler suggesting high ventricular filling pressure;  mild aortic insufficiency and mitral annular calcification.  She has had several readmissions with acute on chronic combined systolic and diastolic heart failure.  When I last saw her, she had been on carvedilol 3.125 mg twice a day, Lasix 40 mg daily, irbesartan 75 mg daily, and isosorbide mononitrate 60 mg.    With her recurrent admissions with CHF with elevated BNP and reduced LV function, I felt  That she was agood candidate to initiate Entresto , which is combination sacubitrill/valsartan.  Her irbesartan was discontinued.  After 2 weeks on this new therapy, she did notice significant improvement.  Unfortunately, she never followed up for dose titration, and has been off therapy for over a month.  She tolerated it with reference to her renal function.  On follow-up laboratory.  I gave her an additional 2 week sample supply today of 24/26, dosing and provided her with samples of 49/51 to initiate after 2 weeks.    After initiating  this higher dose she will undergo.  She will undergo follow-up blood work  2 weeks later.   I will see her in 2-3 months for cardiology reevaluation.  Time spent: 25 minutes  Troy Sine, MD, [facility]  07/27/2015 7:46 PM

## 2015-07-27 NOTE — Patient Instructions (Addendum)
Your physician has recommended you make the following change in your medication: take the [facility] as directed by Dr Claiborne Billings and [name].  Your physician recommends that you return for lab work in: 1 month.  Your physician recommends that you schedule a follow-up appointment in: 2-3 months with Dr Claiborne Billings.

## 2015-08-01 ENCOUNTER — Telehealth: Payer: Self-pay

## 2015-08-01 NOTE — Telephone Encounter (Signed)
Prior auth for Mary Cook 49-51 sent to [facility]

## 2015-08-02 ENCOUNTER — Telehealth: Payer: Self-pay

## 2015-08-02 NOTE — Telephone Encounter (Signed)
Entresto approved by [employer]

## 2015-08-07 ENCOUNTER — Other Ambulatory Visit: Payer: Self-pay | Admitting: Internal Medicine

## 2015-08-07 ENCOUNTER — Encounter: Payer: Self-pay | Admitting: Internal Medicine

## 2015-08-07 ENCOUNTER — Encounter: Payer: Self-pay | Admitting: Gastroenterology

## 2015-08-07 ENCOUNTER — Other Ambulatory Visit: Payer: Self-pay | Admitting: Cardiovascular Disease

## 2015-08-07 MED ORDER — SACUBITRIL-VALSARTAN 49-51 MG PO TABS
1.0000 | ORAL_TABLET | Freq: Two times a day (BID) | ORAL | Status: DC
Start: 2015-08-07 — End: 2015-08-30

## 2015-08-07 NOTE — Telephone Encounter (Signed)
°*  STAT* If patient is at the pharmacy, call can be transferred to refill team.   1. Which medications need to be refilled? (please list name of each medication and dose if known) Entresto-prescription for one month(gets it free with coupon) 2. Which pharmacy/location (including street and city if local pharmacy) is medication to be sent to?[facility][phone]  3. Do they need a 30 day or 90 day supply? [place]

## 2015-08-07 NOTE — Telephone Encounter (Signed)
Rx sent to [facility] in [place] for 30 day supply of entresto.

## 2015-08-30 ENCOUNTER — Encounter: Payer: Self-pay | Admitting: Internal Medicine

## 2015-08-30 ENCOUNTER — Ambulatory Visit (INDEPENDENT_AMBULATORY_CARE_PROVIDER_SITE_OTHER): Payer: Commercial Managed Care - HMO | Admitting: Internal Medicine

## 2015-08-30 VITALS — BP 118/62 | HR 83 | Wt 174.0 lb

## 2015-08-30 DIAGNOSIS — N189 Chronic kidney disease, unspecified: Secondary | ICD-10-CM

## 2015-08-30 DIAGNOSIS — R29898 Other symptoms and signs involving the musculoskeletal system: Secondary | ICD-10-CM | POA: Diagnosis not present

## 2015-08-30 DIAGNOSIS — R296 Repeated falls: Secondary | ICD-10-CM

## 2015-08-30 DIAGNOSIS — M899 Disorder of bone, unspecified: Secondary | ICD-10-CM | POA: Diagnosis not present

## 2015-08-30 DIAGNOSIS — I5022 Chronic systolic (congestive) heart failure: Secondary | ICD-10-CM

## 2015-08-30 DIAGNOSIS — M949 Disorder of cartilage, unspecified: Secondary | ICD-10-CM

## 2015-08-30 DIAGNOSIS — E538 Deficiency of other specified B group vitamins: Secondary | ICD-10-CM | POA: Diagnosis not present

## 2015-08-30 DIAGNOSIS — E1022 Type 1 diabetes mellitus with diabetic chronic kidney disease: Secondary | ICD-10-CM

## 2015-08-30 DIAGNOSIS — Z79899 Other long term (current) drug therapy: Secondary | ICD-10-CM | POA: Diagnosis not present

## 2015-08-30 DIAGNOSIS — I10 Essential (primary) hypertension: Secondary | ICD-10-CM

## 2015-08-30 DIAGNOSIS — I2583 Coronary atherosclerosis due to lipid rich plaque: Secondary | ICD-10-CM

## 2015-08-30 DIAGNOSIS — I2510 Atherosclerotic heart disease of native coronary artery without angina pectoris: Secondary | ICD-10-CM

## 2015-08-30 LAB — CBC
HCT: 41.1 % (ref 36.0–46.0)
Hemoglobin: 13.5 g/dL (ref 12.0–15.0)
MCH: 30.3 pg (ref 26.0–34.0)
MCHC: 32.8 g/dL (ref 30.0–36.0)
MCV: 92.4 fL (ref 78.0–100.0)
MPV: 10.3 fL (ref 8.6–12.4)
PLATELETS: 204 10*3/uL (ref 150–400)
RBC: 4.45 MIL/uL (ref 3.87–5.11)
RDW: 13.6 % (ref 11.5–15.5)
WBC: 6.1 10*3/uL (ref 4.0–10.5)

## 2015-08-30 MED ORDER — CITALOPRAM HYDROBROMIDE 20 MG PO TABS: 20.0000 mg | ORAL_TABLET | Freq: Every day | ORAL | Status: DC

## 2015-08-30 MED ORDER — PANTOPRAZOLE SODIUM 40 MG PO TBEC: 40.0000 mg | DELAYED_RELEASE_TABLET | Freq: Every day | ORAL | Status: DC

## 2015-08-30 MED ORDER — POTASSIUM CHLORIDE CRYS ER 20 MEQ PO TBCR: 20.0000 meq | EXTENDED_RELEASE_TABLET | Freq: Every day | ORAL | Status: DC

## 2015-08-30 MED ORDER — FUROSEMIDE 40 MG PO TABS: 40.0000 mg | ORAL_TABLET | Freq: Two times a day (BID) | ORAL | Status: DC

## 2015-08-30 MED ORDER — METFORMIN HCL 500 MG PO TABS: ORAL_TABLET | ORAL | Status: DC

## 2015-08-30 MED ORDER — DONEPEZIL HCL 5 MG PO TABS: 5.0000 mg | ORAL_TABLET | Freq: Every day | ORAL | Status: DC

## 2015-08-30 MED ORDER — SACUBITRIL-VALSARTAN 49-51 MG PO TABS: 1.0000 | ORAL_TABLET | Freq: Two times a day (BID) | ORAL | Status: DC

## 2015-08-30 NOTE — Assessment & Plan Note (Signed)
On Coreg, Avapro, Lasix, Entresto

## 2015-08-30 NOTE — Progress Notes (Signed)
Pre visit review using our clinic review tool, if applicable. No additional management support is needed unless otherwise documented below in the visit note. 

## 2015-08-30 NOTE — Progress Notes (Signed)
Subjective:  Patient ID: Mary Cook, female    DOB: 19-Sep-1925  Age: 82 y.o. MRN: [id number]  CC: No chief complaint on file.   HPI [employer] presents for CAD, dementia, CHF, falls f/u. C/o fatigue, memory loss is worse.  Outpatient Prescriptions Prior to Visit  Medication Sig Dispense Refill  . ACCU-CHEK SOFTCLIX LANCETS lancets     . acetaminophen (TYLENOL) 325 MG tablet Take 2 tablets (650 mg total) by mouth every 4 (four) hours as needed for headache or mild pain.    Marland Kitchen alum & mag hydroxide-simeth (MAALOX/MYLANTA) 200-200-20 MG/5ML suspension Take 30 mLs by mouth every 2 (two) hours as needed for indigestion. 355 mL 0  . aspirin 81 MG EC tablet Take 81 mg by mouth daily.      . carvedilol (COREG) 3.125 MG tablet Take 1 tablet (3.125 mg total) by mouth 2 (two) times daily. 180 tablet 3  . fluticasone (FLONASE) 50 MCG/ACT nasal spray Place 1 spray into both nostrils daily as needed for allergies or rhinitis.    Marland Kitchen ketoconazole (NIZORAL) 2 % cream Apply 1 application topically daily. Under breasts 45 g 1  . vitamin B-12 (CYANOCOBALAMIN) 500 MCG tablet Place 500 mcg under the tongue daily.    . citalopram (CELEXA) 20 MG tablet Take 1 tablet (20 mg total) by mouth daily. 90 tablet 3  . furosemide (LASIX) 40 MG tablet Take 1 tablet (40 mg total) by mouth daily. (Patient taking differently: Take 40 mg by mouth 2 (two) times daily. ) 90 tablet 3  . HYDROcodone-acetaminophen (NORCO/VICODIN) 5-325 MG per tablet Take 0.5 tablets by mouth every 4 (four) hours as needed. 6 tablet 0  . isosorbide mononitrate (IMDUR) 60 MG 24 hr tablet Take 1 tablet (60 mg total) by mouth daily. 90 tablet 3  . metFORMIN (GLUCOPHAGE) 500 MG tablet TAKE 1 TABLET TWICE DAILY BEFORE A MEAL 180 tablet 3  . omeprazole (PRILOSEC) 20 MG capsule TAKE 1 CAPSULE EVERY DAY 90 capsule 3  . potassium chloride SA (K-DUR,KLOR-CON) 20 MEQ tablet Take 1 tablet by mouth daily.    . sacubitril-valsartan (ENTRESTO)  49-51 MG Take 1 tablet by mouth 2 (two) times daily. 60 tablet 0   No facility-administered medications prior to visit.    ROS Review of Systems  Constitutional: Positive for fatigue. Negative for chills, activity change, appetite change and unexpected weight change.  HENT: Negative for congestion, mouth sores and sinus pressure.   Eyes: Negative for visual disturbance.  Respiratory: Positive for shortness of breath. Negative for cough and chest tightness.   Gastrointestinal: Negative for nausea and abdominal pain.  Genitourinary: Negative for frequency, difficulty urinating and vaginal pain.  Musculoskeletal: Positive for arthralgias and gait problem. Negative for back pain.  Skin: Negative for pallor and rash.  Neurological: Negative for dizziness, tremors, weakness, numbness and headaches.  Psychiatric/Behavioral: Positive for behavioral problems and confusion. Negative for sleep disturbance. The patient is nervous/anxious.     Objective:  BP 118/62 mmHg  Pulse 83  Wt 174 lb (78.926 kg)  SpO2 95%  BP Readings from Last 3 Encounters:  08/30/15 118/62  07/27/15 128/70  05/31/15 148/78    Wt Readings from Last 3 Encounters:  08/30/15 174 lb (78.926 kg)  07/27/15 170 lb (77.111 kg)  05/31/15 170 lb (77.111 kg)    Physical Exam  Constitutional: She appears well-developed. No distress.  HENT:  Head: Normocephalic.  Right Ear: External ear normal.  Left Ear: External ear normal.  Nose:  Nose normal.  Mouth/Throat: Oropharynx is clear and moist.  Eyes: Conjunctivae are normal. Pupils are equal, round, and reactive to light. Right eye exhibits no discharge. Left eye exhibits no discharge.  Neck: Normal range of motion. Neck supple. No JVD present. No tracheal deviation present. No thyromegaly present.  Cardiovascular: Normal rate, regular rhythm and normal heart sounds.   Pulmonary/Chest: No stridor. No respiratory distress. She has no wheezes.  Abdominal: Soft. Bowel sounds  are normal. She exhibits no distension and no mass. There is no tenderness. There is no rebound and no guarding.  Musculoskeletal: She exhibits no edema or tenderness.  Lymphadenopathy:    She has no cervical adenopathy.  Neurological: She displays normal reflexes. No cranial nerve deficit. She exhibits normal muscle tone. Coordination abnormal.  Skin: No rash noted. No erythema.  Psychiatric: She has a normal mood and affect. Her behavior is normal.  Obese Cane Disoriented  Lab Results  Component Value Date   WBC 5.0 05/24/2015   HGB 11.3* 05/24/2015   HCT 33.6* 05/24/2015   PLT 159 05/24/2015   GLUCOSE 266* 06/19/2015   CHOL 165 09/05/2014   TRIG 93 09/05/2014   HDL 70 09/05/2014   LDLDIRECT 180.7 12/09/2011   LDLCALC 76 09/05/2014   ALT 9 06/19/2015   AST 13 06/19/2015   NA 138 06/19/2015   K 4.2 06/19/2015   CL 101 06/19/2015   CREATININE 1.38* 06/19/2015   BUN 16 06/19/2015   CO2 27 06/19/2015   TSH 1.56 10/12/2014   INR 0.97 06/08/2014   HGBA1C 7.1* 10/12/2014    Dg Chest 2 View  05/24/2015  CLINICAL DATA:  Nonproductive cough and increased shortness of breath for 1 week. EXAM: CHEST  2 VIEW COMPARISON:  04/28/2015 FINDINGS: The heart is at the upper limits of normal in size. Calcification of the thoracic aorta. Lungs are somewhat hyperinflated with flattening of the diaphragm consistent with emphysema. Slight peribronchial thickening. No consolidative infiltrates or effusions. No acute osseous abnormality. Previous left rotator cuff surgery. Coronary artery stent in place. Calcification in the mitral valve annulus. IMPRESSION: Probable emphysema and slight bronchitic changes. Aortic atherosclerosis. Electronically Signed   By: Lorriane Shire M.D.   On: 05/24/2015 10:52    Assessment & Plan:   Diagnoses and all orders for this visit:  Falls frequently -     furosemide (LASIX) 40 MG tablet; Take 1 tablet (40 mg total) by mouth 2 (two) times daily. -     Hemoglobin  A1c; Future -     Hepatic function panel; Future -     Basic metabolic panel; Future -     CBC with Differential/Platelet; Future -     TSH; Future  Leg weakness, bilateral -     furosemide (LASIX) 40 MG tablet; Take 1 tablet (40 mg total) by mouth 2 (two) times daily. -     Hemoglobin A1c; Future -     Hepatic function panel; Future -     Basic metabolic panel; Future -     CBC with Differential/Platelet; Future -     TSH; Future  [name] deficiency -     furosemide (LASIX) 40 MG tablet; Take 1 tablet (40 mg total) by mouth 2 (two) times daily. -     Hemoglobin A1c; Future -     Hepatic function panel; Future -     Basic metabolic panel; Future -     CBC with Differential/Platelet; Future -     TSH; Future  Disorder  of bone and cartilage -     furosemide (LASIX) 40 MG tablet; Take 1 tablet (40 mg total) by mouth 2 (two) times daily. -     Hemoglobin A1c; Future -     Hepatic function panel; Future -     Basic metabolic panel; Future -     CBC with Differential/Platelet; Future -     TSH; Future  Coronary artery disease due to lipid rich plaque -     furosemide (LASIX) 40 MG tablet; Take 1 tablet (40 mg total) by mouth 2 (two) times daily. -     Hemoglobin A1c; Future -     Hepatic function panel; Future -     Basic metabolic panel; Future -     CBC with Differential/Platelet; Future -     TSH; Future  Type 1 diabetes mellitus with diabetic chronic kidney disease, unspecified CKD stage (HCC) -     furosemide (LASIX) 40 MG tablet; Take 1 tablet (40 mg total) by mouth 2 (two) times daily. -     Hemoglobin A1c; Future -     Hepatic function panel; Future -     Basic metabolic panel; Future -     CBC with Differential/Platelet; Future -     TSH; Future  Other orders -     potassium chloride SA (K-DUR,KLOR-CON) 20 MEQ tablet; Take 1 tablet (20 mEq total) by mouth daily. -     metFORMIN (GLUCOPHAGE) 500 MG tablet; TAKE 1 TABLET TWICE DAILY BEFORE A MEAL -     citalopram  (CELEXA) 20 MG tablet; Take 1 tablet (20 mg total) by mouth daily. -     donepezil (ARICEPT) 5 MG tablet; Take 1 tablet (5 mg total) by mouth at bedtime. -     sacubitril-valsartan (ENTRESTO) 49-51 MG; Take 1 tablet by mouth 2 (two) times daily. -     Discontinue: pantoprazole (PROTONIX) 40 MG tablet; Take 1 tablet (40 mg total) by mouth daily. -     pantoprazole (PROTONIX) 40 MG tablet; Take 1 tablet (40 mg total) by mouth daily.   I have discontinued Ms. Lafata's isosorbide mononitrate, HYDROcodone-acetaminophen, and omeprazole. I have also changed her potassium chloride SA and furosemide. Additionally, I am having her start on donepezil. Lastly, I am having her maintain her aspirin, fluticasone, carvedilol, ACCU-CHEK SOFTCLIX LANCETS, acetaminophen, alum & mag hydroxide-simeth, ketoconazole, vitamin B-12, metFORMIN, citalopram, sacubitril-valsartan, and pantoprazole.  Meds ordered this encounter  Medications  . potassium chloride SA (K-DUR,KLOR-CON) 20 MEQ tablet    Sig: Take 1 tablet (20 mEq total) by mouth daily.    Dispense:  90 tablet    Refill:  3  . metFORMIN (GLUCOPHAGE) 500 MG tablet    Sig: TAKE 1 TABLET TWICE DAILY BEFORE A MEAL    Dispense:  180 tablet    Refill:  3  . furosemide (LASIX) 40 MG tablet    Sig: Take 1 tablet (40 mg total) by mouth 2 (two) times daily.    Dispense:  90 tablet    Refill:  3  . citalopram (CELEXA) 20 MG tablet    Sig: Take 1 tablet (20 mg total) by mouth daily.    Dispense:  90 tablet    Refill:  3  . donepezil (ARICEPT) 5 MG tablet    Sig: Take 1 tablet (5 mg total) by mouth at bedtime.    Dispense:  90 tablet    Refill:  3  . sacubitril-valsartan (ENTRESTO) 49-51 MG  Sig: Take 1 tablet by mouth 2 (two) times daily.    Dispense:  60 tablet    Refill:  11  . DISCONTD: pantoprazole (PROTONIX) 40 MG tablet    Sig: Take 1 tablet (40 mg total) by mouth daily.    Dispense:  30 tablet    Refill:  11  . pantoprazole (PROTONIX) 40 MG  tablet    Sig: Take 1 tablet (40 mg total) by mouth daily.    Dispense:  90 tablet    Refill:  3     Follow-up: Return in about 3 months (around 11/28/2015) for a follow-up visit.  Walker Kehr, MD

## 2015-08-31 LAB — COMPREHENSIVE METABOLIC PANEL
ALBUMIN: 3.8 g/dL (ref 3.6–5.1)
ALT: 8 U/L (ref 6–29)
AST: 13 U/L (ref 10–35)
Alkaline Phosphatase: 66 U/L (ref 33–130)
BUN: 25 mg/dL (ref 7–25)
CHLORIDE: 103 mmol/L (ref 98–110)
CO2: 28 mmol/L (ref 20–31)
Calcium: 9.5 mg/dL (ref 8.6–10.4)
Creat: 1.38 mg/dL — ABNORMAL HIGH (ref 0.60–0.88)
Glucose, Bld: 224 mg/dL — ABNORMAL HIGH (ref 65–99)
POTASSIUM: 3.9 mmol/L (ref 3.5–5.3)
Sodium: 141 mmol/L (ref 135–146)
TOTAL PROTEIN: 6.9 g/dL (ref 6.1–8.1)
Total Bilirubin: 0.4 mg/dL (ref 0.2–1.2)

## 2015-09-05 ENCOUNTER — Other Ambulatory Visit: Payer: Self-pay | Admitting: Cardiovascular Disease

## 2015-09-05 MED ORDER — SACUBITRIL-VALSARTAN 49-51 MG PO TABS: 1.0000 | ORAL_TABLET | Freq: Two times a day (BID) | ORAL | Status: DC

## 2015-09-05 NOTE — Telephone Encounter (Signed)
°*  STAT* If patient is at the pharmacy, call can be transferred to refill team.   1. Which medications need to be refilled? (please list name of each medication and dose if known) Generic Entresto-new prescription  2. Which pharmacy/location (including street and city if local pharmacy) is medication to be sent to?[facility] [phone] 3. Do they need a 30 day or 90 day supply? 60 and refills

## 2015-09-05 NOTE — Telephone Encounter (Signed)
Refill sent to the pharmacy electronically.  

## 2015-09-19 ENCOUNTER — Telehealth: Payer: Self-pay | Admitting: *Deleted

## 2015-09-19 NOTE — Telephone Encounter (Signed)
Left message to return a call. ( discuss lab results)

## 2015-09-19 NOTE — Telephone Encounter (Signed)
-----   Message from Troy Sine, MD sent at 09/17/2015  1:04 PM EST ----- Lab good x glu; Cr stable

## 2015-09-20 ENCOUNTER — Telehealth: Payer: Self-pay | Admitting: Cardiovascular Disease

## 2015-09-20 NOTE — Telephone Encounter (Signed)
Left message to call. Results in basket.

## 2015-09-20 NOTE — Telephone Encounter (Signed)
I returned call to Mary Cook (DPR). She states she is unsure but thinks [place] discussed some things w patient, patient does not recall due to her memory loss. I reviewed recent labwork w/ her. [name] requests call from [place] when she is back in office.

## 2015-09-20 NOTE — Telephone Encounter (Signed)
Mary Cook called in wanting to speak with [name] about the pt's lab results . Please f/u with her  Thanks

## 2015-09-20 NOTE — Telephone Encounter (Signed)
[name] is returning [facility]

## 2015-09-21 NOTE — Telephone Encounter (Signed)
Follow up ° ° ° ° ° °Returning a call to the nurse °

## 2015-10-09 ENCOUNTER — Encounter: Payer: Self-pay | Admitting: *Deleted

## 2015-10-13 ENCOUNTER — Ambulatory Visit: Payer: Commercial Managed Care - HMO | Admitting: Cardiovascular Disease

## 2015-10-13 DIAGNOSIS — I10 Essential (primary) hypertension: Secondary | ICD-10-CM | POA: Diagnosis not present

## 2015-10-13 DIAGNOSIS — J029 Acute pharyngitis, unspecified: Secondary | ICD-10-CM | POA: Diagnosis not present

## 2015-10-13 DIAGNOSIS — Z79899 Other long term (current) drug therapy: Secondary | ICD-10-CM | POA: Diagnosis not present

## 2015-10-13 DIAGNOSIS — I252 Old myocardial infarction: Secondary | ICD-10-CM | POA: Diagnosis not present

## 2015-10-13 DIAGNOSIS — E119 Type 2 diabetes mellitus without complications: Secondary | ICD-10-CM | POA: Diagnosis not present

## 2015-10-29 ENCOUNTER — Observation Stay (HOSPITAL_COMMUNITY)
Admission: EM | Admit: 2015-10-29 | Discharge: 2015-10-31 | Disposition: A | Discharge status: Home / Self Care | Payer: Commercial Managed Care - HMO | Attending: Internal Medicine | Admitting: Internal Medicine

## 2015-10-29 ENCOUNTER — Encounter (HOSPITAL_COMMUNITY): Payer: Self-pay

## 2015-10-29 ENCOUNTER — Emergency Department (HOSPITAL_COMMUNITY): Payer: Commercial Managed Care - HMO

## 2015-10-29 DIAGNOSIS — E1169 Type 2 diabetes mellitus with other specified complication: Secondary | ICD-10-CM

## 2015-10-29 DIAGNOSIS — R9431 Abnormal electrocardiogram [ECG] [EKG]: Secondary | ICD-10-CM

## 2015-10-29 DIAGNOSIS — E119 Type 2 diabetes mellitus without complications: Secondary | ICD-10-CM

## 2015-10-29 DIAGNOSIS — I2510 Atherosclerotic heart disease of native coronary artery without angina pectoris: Secondary | ICD-10-CM | POA: Insufficient documentation

## 2015-10-29 DIAGNOSIS — I255 Ischemic cardiomyopathy: Secondary | ICD-10-CM | POA: Diagnosis present

## 2015-10-29 DIAGNOSIS — M899 Disorder of bone, unspecified: Secondary | ICD-10-CM

## 2015-10-29 DIAGNOSIS — Z90710 Acquired absence of both cervix and uterus: Secondary | ICD-10-CM | POA: Diagnosis not present

## 2015-10-29 DIAGNOSIS — N183 Chronic kidney disease, stage 3 unspecified: Secondary | ICD-10-CM

## 2015-10-29 DIAGNOSIS — R079 Chest pain, unspecified: Secondary | ICD-10-CM | POA: Diagnosis not present

## 2015-10-29 DIAGNOSIS — F439 Reaction to severe stress, unspecified: Secondary | ICD-10-CM

## 2015-10-29 DIAGNOSIS — N3090 Cystitis, unspecified without hematuria: Secondary | ICD-10-CM

## 2015-10-29 DIAGNOSIS — G4700 Insomnia, unspecified: Secondary | ICD-10-CM

## 2015-10-29 DIAGNOSIS — Z9114 Patient's other noncompliance with medication regimen: Secondary | ICD-10-CM

## 2015-10-29 DIAGNOSIS — M25512 Pain in left shoulder: Secondary | ICD-10-CM | POA: Diagnosis present

## 2015-10-29 DIAGNOSIS — Z79899 Other long term (current) drug therapy: Secondary | ICD-10-CM | POA: Insufficient documentation

## 2015-10-29 DIAGNOSIS — M1990 Unspecified osteoarthritis, unspecified site: Secondary | ICD-10-CM | POA: Insufficient documentation

## 2015-10-29 DIAGNOSIS — Z9049 Acquired absence of other specified parts of digestive tract: Secondary | ICD-10-CM | POA: Insufficient documentation

## 2015-10-29 DIAGNOSIS — Z7982 Long term (current) use of aspirin: Secondary | ICD-10-CM | POA: Insufficient documentation

## 2015-10-29 DIAGNOSIS — Z8719 Personal history of other diseases of the digestive system: Secondary | ICD-10-CM | POA: Diagnosis not present

## 2015-10-29 DIAGNOSIS — Z8673 Personal history of transient ischemic attack (TIA), and cerebral infarction without residual deficits: Secondary | ICD-10-CM | POA: Diagnosis not present

## 2015-10-29 DIAGNOSIS — S22080D Wedge compression fracture of T11-T12 vertebra, subsequent encounter for fracture with routine healing: Secondary | ICD-10-CM

## 2015-10-29 DIAGNOSIS — I10 Essential (primary) hypertension: Secondary | ICD-10-CM | POA: Diagnosis present

## 2015-10-29 DIAGNOSIS — I110 Hypertensive heart disease with heart failure: Secondary | ICD-10-CM | POA: Insufficient documentation

## 2015-10-29 DIAGNOSIS — R0781 Pleurodynia: Secondary | ICD-10-CM

## 2015-10-29 DIAGNOSIS — E538 Deficiency of other specified B group vitamins: Secondary | ICD-10-CM

## 2015-10-29 DIAGNOSIS — Z91119 Patient's noncompliance with dietary regimen due to unspecified reason: Secondary | ICD-10-CM

## 2015-10-29 DIAGNOSIS — I5022 Chronic systolic (congestive) heart failure: Secondary | ICD-10-CM | POA: Diagnosis not present

## 2015-10-29 DIAGNOSIS — Z794 Long term (current) use of insulin: Secondary | ICD-10-CM | POA: Diagnosis not present

## 2015-10-29 DIAGNOSIS — R29898 Other symptoms and signs involving the musculoskeletal system: Secondary | ICD-10-CM

## 2015-10-29 DIAGNOSIS — M546 Pain in thoracic spine: Secondary | ICD-10-CM | POA: Diagnosis not present

## 2015-10-29 DIAGNOSIS — L304 Erythema intertrigo: Secondary | ICD-10-CM

## 2015-10-29 DIAGNOSIS — I25111 Atherosclerotic heart disease of native coronary artery with angina pectoris with documented spasm: Secondary | ICD-10-CM

## 2015-10-29 DIAGNOSIS — M5440 Lumbago with sciatica, unspecified side: Secondary | ICD-10-CM

## 2015-10-29 DIAGNOSIS — N289 Disorder of kidney and ureter, unspecified: Secondary | ICD-10-CM

## 2015-10-29 DIAGNOSIS — I5043 Acute on chronic combined systolic (congestive) and diastolic (congestive) heart failure: Secondary | ICD-10-CM

## 2015-10-29 DIAGNOSIS — G4730 Sleep apnea, unspecified: Secondary | ICD-10-CM

## 2015-10-29 DIAGNOSIS — G4733 Obstructive sleep apnea (adult) (pediatric): Secondary | ICD-10-CM

## 2015-10-29 DIAGNOSIS — Z9111 Patient's noncompliance with dietary regimen: Secondary | ICD-10-CM

## 2015-10-29 DIAGNOSIS — N189 Chronic kidney disease, unspecified: Secondary | ICD-10-CM

## 2015-10-29 DIAGNOSIS — M949 Disorder of cartilage, unspecified: Secondary | ICD-10-CM

## 2015-10-29 DIAGNOSIS — R296 Repeated falls: Secondary | ICD-10-CM

## 2015-10-29 DIAGNOSIS — Z9989 Dependence on other enabling machines and devices: Secondary | ICD-10-CM

## 2015-10-29 DIAGNOSIS — R002 Palpitations: Secondary | ICD-10-CM

## 2015-10-29 DIAGNOSIS — L570 Actinic keratosis: Secondary | ICD-10-CM

## 2015-10-29 DIAGNOSIS — K219 Gastro-esophageal reflux disease without esophagitis: Secondary | ICD-10-CM

## 2015-10-29 DIAGNOSIS — E785 Hyperlipidemia, unspecified: Secondary | ICD-10-CM | POA: Diagnosis not present

## 2015-10-29 DIAGNOSIS — M549 Dorsalgia, unspecified: Secondary | ICD-10-CM

## 2015-10-29 LAB — CBC
HEMATOCRIT: 37.4 % (ref 36.0–46.0)
HEMOGLOBIN: 12.2 g/dL (ref 12.0–15.0)
MCH: 29.9 pg (ref 26.0–34.0)
MCHC: 32.6 g/dL (ref 30.0–36.0)
MCV: 91.7 fL (ref 78.0–100.0)
Platelets: 215 10*3/uL (ref 150–400)
RBC: 4.08 MIL/uL (ref 3.87–5.11)
RDW: 12.6 % (ref 11.5–15.5)
WBC: 5.4 10*3/uL (ref 4.0–10.5)

## 2015-10-29 LAB — BASIC METABOLIC PANEL
ANION GAP: 9 (ref 5–15)
BUN: 22 mg/dL — AB (ref 6–20)
CHLORIDE: 103 mmol/L (ref 101–111)
CO2: 29 mmol/L (ref 22–32)
Calcium: 9.2 mg/dL (ref 8.9–10.3)
Creatinine, Ser: 1.33 mg/dL — ABNORMAL HIGH (ref 0.44–1.00)
GFR calc non Af Amer: 35 mL/min — ABNORMAL LOW (ref >60)
GFR, EST AFRICAN AMERICAN: 40 mL/min — AB (ref >60)
Glucose, Bld: 295 mg/dL — ABNORMAL HIGH (ref 65–99)
POTASSIUM: 3.6 mmol/L (ref 3.5–5.1)
SODIUM: 141 mmol/L (ref 135–145)

## 2015-10-29 LAB — TROPONIN I: Troponin I: <0.03 ng/mL (ref <0.031)

## 2015-10-29 MED ORDER — ASPIRIN 81 MG PO CHEW
324.0000 mg | CHEWABLE_TABLET | Freq: Once | ORAL | Status: AC
  Administered 2015-10-29: 324 mg via ORAL
  Filled 2015-10-29: qty 4

## 2015-10-29 MED ORDER — MORPHINE SULFATE (PF) 2 MG/ML IV SOLN
2.0000 mg | INTRAVENOUS | Status: DC | PRN
  Administered 2015-10-30: 2 mg via INTRAVENOUS
  Filled 2015-10-29: qty 1

## 2015-10-29 MED ORDER — CITALOPRAM HYDROBROMIDE 20 MG PO TABS
20.0000 mg | ORAL_TABLET | Freq: Every day | ORAL | Status: DC
  Administered 2015-10-30 – 2015-10-31 (×2): 20 mg via ORAL
  Filled 2015-10-29 (×2): qty 1

## 2015-10-29 MED ORDER — CARVEDILOL 3.125 MG PO TABS
3.1250 mg | ORAL_TABLET | Freq: Two times a day (BID) | ORAL | Status: DC
  Administered 2015-10-29 – 2015-10-30 (×2): 3.125 mg via ORAL
  Filled 2015-10-29 (×2): qty 1

## 2015-10-29 MED ORDER — ONDANSETRON HCL 4 MG/2ML IJ SOLN: 4.0000 mg | Freq: Three times a day (TID) | INTRAMUSCULAR | Status: AC | PRN

## 2015-10-29 MED ORDER — INSULIN ASPART 100 UNIT/ML ~~LOC~~ SOLN
0.0000 [IU] | Freq: Three times a day (TID) | SUBCUTANEOUS | Status: DC
  Administered 2015-10-30: 2 [IU] via SUBCUTANEOUS
  Administered 2015-10-30: 1 [IU] via SUBCUTANEOUS
  Administered 2015-10-31: 2 [IU] via SUBCUTANEOUS

## 2015-10-29 MED ORDER — NITROGLYCERIN 2 % TD OINT
0.5000 [in_us] | TOPICAL_OINTMENT | Freq: Once | TRANSDERMAL | Status: AC
  Administered 2015-10-29: 0.5 [in_us] via TOPICAL
  Filled 2015-10-29: qty 1

## 2015-10-29 MED ORDER — PANTOPRAZOLE SODIUM 40 MG PO TBEC
40.0000 mg | DELAYED_RELEASE_TABLET | Freq: Every day | ORAL | Status: DC
  Administered 2015-10-30 – 2015-10-31 (×2): 40 mg via ORAL
  Filled 2015-10-29 (×2): qty 1

## 2015-10-29 MED ORDER — DONEPEZIL HCL 5 MG PO TABS
5.0000 mg | ORAL_TABLET | Freq: Every day | ORAL | Status: DC
  Administered 2015-10-29 – 2015-10-30 (×2): 5 mg via ORAL
  Filled 2015-10-29 (×2): qty 1

## 2015-10-29 MED ORDER — ACETAMINOPHEN 325 MG PO TABS
650.0000 mg | ORAL_TABLET | ORAL | Status: DC | PRN
  Administered 2015-10-29 – 2015-10-30 (×2): 650 mg via ORAL
  Filled 2015-10-29 (×2): qty 2

## 2015-10-29 MED ORDER — IOHEXOL 350 MG/ML SOLN
80.0000 mL | Freq: Once | INTRAVENOUS | Status: AC | PRN
  Administered 2015-10-29: 80 mL via INTRAVENOUS

## 2015-10-29 MED ORDER — SODIUM CHLORIDE 0.9 % IV SOLN
INTRAVENOUS | Status: DC
  Administered 2015-10-29: via INTRAVENOUS

## 2015-10-29 MED ORDER — ENOXAPARIN SODIUM 40 MG/0.4ML ~~LOC~~ SOLN
40.0000 mg | SUBCUTANEOUS | Status: DC
  Administered 2015-10-29 – 2015-10-30 (×2): 40 mg via SUBCUTANEOUS
  Filled 2015-10-29 (×2): qty 0.4

## 2015-10-29 MED ORDER — FUROSEMIDE 40 MG PO TABS
40.0000 mg | ORAL_TABLET | Freq: Two times a day (BID) | ORAL | Status: DC
  Administered 2015-10-30 – 2015-10-31 (×3): 40 mg via ORAL
  Filled 2015-10-29 (×3): qty 1

## 2015-10-29 MED ORDER — ASPIRIN EC 81 MG PO TBEC
81.0000 mg | DELAYED_RELEASE_TABLET | Freq: Every day | ORAL | Status: DC
  Administered 2015-10-30 – 2015-10-31 (×2): 81 mg via ORAL
  Filled 2015-10-29 (×2): qty 1

## 2015-10-29 MED ORDER — POTASSIUM CHLORIDE CRYS ER 20 MEQ PO TBCR
20.0000 meq | EXTENDED_RELEASE_TABLET | Freq: Every day | ORAL | Status: DC
  Administered 2015-10-30 – 2015-10-31 (×2): 20 meq via ORAL
  Filled 2015-10-29 (×2): qty 1

## 2015-10-29 MED ORDER — SACUBITRIL-VALSARTAN 49-51 MG PO TABS
1.0000 | ORAL_TABLET | Freq: Two times a day (BID) | ORAL | Status: DC
Start: 2015-10-29 — End: 2015-10-31
  Administered 2015-10-29 – 2015-10-31 (×4): 1 via ORAL
  Filled 2015-10-29 (×6): qty 1

## 2015-10-29 MED ORDER — CYCLOBENZAPRINE HCL 10 MG PO TABS
5.0000 mg | ORAL_TABLET | Freq: Three times a day (TID) | ORAL | Status: DC
  Administered 2015-10-29 – 2015-10-31 (×5): 5 mg via ORAL
  Filled 2015-10-29 (×5): qty 1

## 2015-10-29 MED ORDER — FLUTICASONE PROPIONATE 50 MCG/ACT NA SUSP: 1.0000 | Freq: Every day | NASAL | Status: DC | PRN

## 2015-10-29 NOTE — ED Notes (Signed)
Complains of pain under left shoulder blade that radiates into chest that started last night. States "I have a hard time catching my breath when I lay down". Denies respiratory issues at this time. NAD noted.

## 2015-10-29 NOTE — ED Provider Notes (Signed)
CSN: [id number]     Arrival date & time 10/29/15  1753 History   First MD Initiated Contact with Patient 10/29/15 1856     Chief Complaint  Patient presents with  . Shoulder Pain     (Consider location/radiation/quality/duration/timing/severity/associated sxs/prior Treatment) HPI  The patient is 82 years old, she has a history of coronary artery disease status post multiple stents, catheterization in 2015 with an ejection fraction of 35%. She had in-stent stenosis, history of GI bleed, history of cerebral aneurysm and stroke and a history of an ischemic cardiomyopathy. She also has a history of a thoracic compression fracture that occurred sometime around September of last year. The patient reports that over the last 24 hours since 6:00 last evening she has developed a left scapular pain that is associated with intermittent chest pain. Sharp and stabbing, intermittent in the chest, associated shortness of breath as well. The patient has no abdominal pain, no fevers chills nausea vomiting or coughing. The symptoms have been persistent in the back, intermittent in the chest, she does not associate this with position, eating or exertion.  Past Medical History  Diagnosis Date  . Essential hypertension   . Osteoarthritis   . Osteopenia   . Diverticulosis of colon   . Type II diabetes mellitus ([place])   . CAD (coronary artery disease)     a. 04/2010 MI DES x 2 to LCX;  b. 05/2014 Cath: EF 35%, patent LCX stents w/ 70% stenosis in small AV groove LCX, Diag 50-60, RCA 50-60p->Med Rx.  Marland Kitchen Hyperlipidemia   . GIB (gastrointestinal bleeding)     a. 2002 Diverticular bleed.  . Skin disorder   . Visual disorder   . Thrombus   . Sleep apnea     cpap therapy  . Frequent urination   . Cerebral aneurysm   . Stroke ([place]) 2002  . Chronic systolic CHF (congestive heart failure) ([place])     a. 01/2015 Echo: EF 30-35%, sev inflat HK, Gr 1DD, mild AI.  . Ischemic cardiomyopathy     a. 01/2015 Echo: EF 30-35%.   . T12 compression fracture ([place])     a. 04/2015.   Past Surgical History  Procedure Laterality Date  . Abdominal hysterectomy  1980    no bso  . Coronary angioplasty with stent placement  05/11/2010    stent CFX  . Cardiac catheterization  07/2011    LAD OK, D1 80%, CFX 30% w/ patent stent, RCA 50-60%, EF 40%  . Thrombectomy    . Cardiac catheterization    . Doppler echocardiography    . Myocardial perfusion study    . Left heart catheterization with coronary angiogram N/A 08/18/2011    Procedure: LEFT HEART CATHETERIZATION WITH CORONARY ANGIOGRAM;  Surgeon: Wellington Hampshire, MD;  Location: [facility] CATH LAB;  Service: Cardiovascular;  Laterality: N/A;  . Left heart catheterization with coronary angiogram N/A 06/08/2014    Procedure: LEFT HEART CATHETERIZATION WITH CORONARY ANGIOGRAM;  Surgeon: Troy Sine, MD;  [facility] C[facility];  Service: Cardiovascular;  Laterality: N/A;  . Tonsillectomy    . Appendectomy     Family History  Problem Relation Age of Onset  . Hypertension Other   . CAD      father   Social History  Substance Use Topics  . Smoking status: Never Smoker   . Smokeless tobacco: Never Used  . Alcohol Use: No   OB History    No data available     Review of  Systems  All other systems reviewed and are negative.     Allergies  Clopidogrel bisulfate; Lipitor; and Lisinopril  Home Medications   Prior to Admission medications   Medication Sig Start Date End Date Taking? Authorizing Provider  acetaminophen (TYLENOL) 325 MG tablet Take 2 tablets (650 mg total) by mouth every 4 (four) hours as needed for headache or mild pain. 02/05/15  Yes Erlene Quan, PA-C  aspirin 81 MG EC tablet Take 81 mg by mouth daily.     Yes Historical Provider, MD  carvedilol (COREG) 3.125 MG tablet Take 1 tablet (3.125 mg total) by mouth 2 (two) times daily. 09/05/14   Troy Sine, MD  citalopram (CELEXA) 20 MG tablet Take 1 tablet (20 mg total) by mouth daily. 08/30/15   Aleksei  Plotnikov V, MD  donepezil (ARICEPT) 5 MG tablet Take 1 tablet (5 mg total) by mouth at bedtime. 08/30/15   Aleksei Plotnikov V, MD  fluticasone (FLONASE) 50 MCG/ACT nasal spray Place 1 spray into both nostrils daily as needed for allergies or rhinitis.    Historical Provider, MD  furosemide (LASIX) 40 MG tablet Take 1 tablet (40 mg total) by mouth 2 (two) times daily. 08/30/15   Aleksei Plotnikov V, MD  ketoconazole (NIZORAL) 2 % cream Apply 1 application topically daily. Under breasts 02/28/15   Lew Dawes V, MD  metFORMIN (GLUCOPHAGE) 500 MG tablet TAKE 1 TABLET TWICE DAILY BEFORE A MEAL 08/30/15   Aleksei Plotnikov V, MD  pantoprazole (PROTONIX) 40 MG tablet Take 1 tablet (40 mg total) by mouth daily. 08/30/15   Aleksei Plotnikov V, MD  potassium chloride SA (K-DUR,KLOR-CON) 20 MEQ tablet Take 1 tablet (20 mEq total) by mouth daily. 08/30/15   Aleksei Plotnikov V, MD  sacubitril-valsartan (ENTRESTO) 49-51 MG Take 1 tablet by mouth 2 (two) times daily. 09/05/15   Troy Sine, MD   BP 160/72 mmHg  Pulse 72  Temp(Src) 97.8 F (36.6 C) (Oral)  Resp 19  Ht 5\' 5"  (1.651 m)  Wt 170 lb (77.111 kg)  BMI 28.29 kg/m2  SpO2 99% Physical Exam  Constitutional: She appears well-developed and well-nourished. No distress.  HENT:  Head: Normocephalic and atraumatic.  Mouth/Throat: Oropharynx is clear and moist. No oropharyngeal exudate.  Eyes: Conjunctivae and EOM are normal. Pupils are equal, round, and reactive to light. Right eye exhibits no discharge. Left eye exhibits no discharge. No scleral icterus.  Neck: Normal range of motion. Neck supple. No JVD present. No thyromegaly present.  Cardiovascular: Normal rate, regular rhythm, normal heart sounds and intact distal pulses.  Exam reveals no gallop and no friction rub.   No murmur heard. Pulmonary/Chest: Effort normal and breath sounds normal. No respiratory distress. She has no wheezes. She has no rales. Tenderness:  No tenderness over the chest  wall.  Abdominal: Soft. Bowel sounds are normal. She exhibits no distension and no mass. There is no tenderness.  Musculoskeletal: Normal range of motion. She exhibits no edema or tenderness.  No reproducible tenderness over the chest wall or the left scapular region, no pain with range of motion of the arm  Lymphadenopathy:    She has no cervical adenopathy.  Neurological: She is alert. Coordination normal.  Skin: Skin is warm and dry. No rash noted. No erythema.  Psychiatric: She has a normal mood and affect. Her behavior is normal.  Nursing note and vitals reviewed.   ED Course  Procedures (including critical care time) Labs Review Labs Reviewed  BASIC METABOLIC  PANEL - Abnormal; Notable for the following:    Glucose, Bld 295 (*)    BUN 22 (*)    Creatinine, Ser 1.33 (*)    GFR calc non Af Amer 35 (*)    GFR calc Af Amer 40 (*)    All other components within normal limits  CBC  TROPONIN I    Imaging Review Dg Chest 2 View  10/29/2015  CLINICAL DATA:  Left chest pain and orthopnea. Coronary artery disease and ischemic cardiomyopathy. EXAM: CHEST  2 VIEW COMPARISON:  05/24/2015 FINDINGS: Heart size remains at the upper limits of normal. Both lungs are clear. No evidence of pneumothorax or pleural effusion. IMPRESSION: Stable exam.  No active disease. Electronically Signed   By: Earle Gell M.D.   On: 10/29/2015 19:47   Ct Angio Chest Pe W/cm &/or Wo Cm  10/29/2015  CLINICAL DATA:  82 year old female with left-sided chest and back pain. Symptom onset last night. EXAM: CT ANGIOGRAPHY CHEST WITH CONTRAST TECHNIQUE: Multidetector CT imaging of the chest was performed using the standard protocol during bolus administration of intravenous contrast. Multiplanar CT image reconstructions and MIPs were obtained to evaluate the vascular anatomy. CONTRAST:  24mL OMNIPAQUE IOHEXOL 350 MG/ML SOLN COMPARISON:  Radiographs earlier this day. Ventilation perfusion scan 01/31/2015, chest CT 08/17/2011  FINDINGS: There are no filling defects within the pulmonary arteries to suggest pulmonary embolus. Atherosclerosis of the thoracic aorta without aneurysm or dissection. There is distal aortic tortuosity. Coronary artery calcifications. Multi chamber cardiomegaly. No pericardial effusion. No mediastinal or hilar adenopathy. No pleural effusion. No consolidation to suggest pneumonia. Heterogeneous attenuation in the lower lobes and lingula, may reflect small airways disease versus pulmonary edema. There is minimal smooth septal thickening. No pulmonary mass or suspicious nodule. Hypodense left thyroid nodule measures 1.4 cm. Esophagus is patulous. Small hiatal hernia. No acute abnormality in the included upper abdomen. There are no acute or suspicious osseous abnormalities. Review of the MIP images confirms the above findings. IMPRESSION: 1. No pulmonary embolus. 2. Multi chamber cardiomegaly. Minimal heterogeneous attenuation of the lower lobes is no septal thickening, can be seen with small airways disease versus mild pulmonary edema. 3. Atherosclerosis of thoracic aorta. Coronary artery calcifications. Electronically Signed   By: Jeb Levering M.D.   On: 10/29/2015 21:02   I have personally reviewed and evaluated these images and lab results as part of my medical decision-making.   EKG Interpretation   Date/Time:  Sunday October 29 2015 18:31:26 EST Ventricular Rate:  73 PR Interval:  170 QRS Duration: 126 QT Interval:  422 QTC Calculation: 464 R Axis:   -43 Text Interpretation:  Normal sinus rhythm Left axis deviation Left  ventricular hypertrophy with QRS widening and repolarization abnormality  Abnormal ECG since last tracing no significant change Confirmed by Mykenna Viele   MD, Yareliz Thorstenson ([id number]) on 10/29/2015 6:45:25 PM      MDM   Final diagnoses:  Chest pain, unspecified chest pain type    The patient appears well, her EKG shows left ventricular hypertrophy which is consistent with prior EKGs.  Her vital signs are unremarkable, check labs including a troponin, I am concerned for coronary disease given the patient's history of obstructive disease.  The pt has normal trop Echo abnormal in the past Cath in the past with known disease VS with no tachycarida - but ongoing intermittent CP -  R/o - d/w Dr. Darrick Meigs who is in agreement.  Meds given in ED:  Medications  aspirin chewable tablet 324 mg (  not administered)  nitroGLYCERIN (NITROGLYN) 2 % ointment 0.5 inch (not administered)  iohexol (OMNIPAQUE) 350 MG/ML injection 80 mL (80 mLs Intravenous Contrast Given 10/29/15 2042)      Noemi Chapel, MD 10/29/15 2154

## 2015-10-29 NOTE — H&P (Signed)
PCP:   Walker Kehr, MD   Chief Complaint:  Left sided back pain  HPI: 82 year old female who   has a past medical history of Essential hypertension; Osteoarthritis; Osteopenia; Diverticulosis of colon; Type II diabetes mellitus ([place]); CAD (coronary artery disease); Hyperlipidemia; GIB (gastrointestinal bleeding); Skin disorder; Visual disorder; Thrombus; Sleep apnea; Frequent urination; Cerebral aneurysm; Stroke ([place]) (2002); Chronic systolic CHF (congestive heart failure) ([place]); Ischemic cardiomyopathy; and T12 compression fracture ([place]). Patient has history of CAD status post multiple stents, cardiac cath in 2015 showed EF of 35%. She had in-stent stenosis. History of cerebral aneurysm and stroke, history of thoracic compression fracture. Patient says that over the past 24 hours she has had severe left-sided back pain associated with some chest pain. The pain is sharp and stabbing. Denies any fever, no dysuria. Has intermittent shortness of breath. Patient says the pain becomes worse on movement. In the ED cardiac enzymes are negative. CT angiogram of the chest was done which was negative for pulmonary embolism  Allergies:   Allergies  Allergen Reactions  . Clopidogrel Bisulfate Nausea And Vomiting    Patient is not aware of this allergy  . Lipitor [Atorvastatin] Other (See Comments)    Weak legs  . Lisinopril Cough    Patient is not aware of this allergy      Past Medical History  Diagnosis Date  . Essential hypertension   . Osteoarthritis   . Osteopenia   . Diverticulosis of colon   . Type II diabetes mellitus ([place])   . CAD (coronary artery disease)     a. 04/2010 MI DES x 2 to LCX;  b. 05/2014 Cath: EF 35%, patent LCX stents w/ 70% stenosis in small AV groove LCX, Diag 50-60, RCA 50-60p->Med Rx.  Marland Kitchen Hyperlipidemia   . GIB (gastrointestinal bleeding)     a. 2002 Diverticular bleed.  . Skin disorder   . Visual disorder   . Thrombus   . Sleep apnea     cpap therapy  .  Frequent urination   . Cerebral aneurysm   . Stroke ([place]) 2002  . Chronic systolic CHF (congestive heart failure) ([place])     a. 01/2015 Echo: EF 30-35%, sev inflat HK, Gr 1DD, mild AI.  . Ischemic cardiomyopathy     a. 01/2015 Echo: EF 30-35%.  . T12 compression fracture ([place])     a. 04/2015.    Past Surgical History  Procedure Laterality Date  . Abdominal hysterectomy  1980    no bso  . Coronary angioplasty with stent placement  05/11/2010    stent CFX  . Cardiac catheterization  07/2011    LAD OK, D1 80%, CFX 30% w/ patent stent, RCA 50-60%, EF 40%  . Thrombectomy    . Cardiac catheterization    . Doppler echocardiography    . Myocardial perfusion study    . Left heart catheterization with coronary angiogram N/A 08/18/2011    Procedure: LEFT HEART CATHETERIZATION WITH CORONARY ANGIOGRAM;  Surgeon: Wellington Hampshire, MD;  Location: [facility] CATH LAB;  Service: Cardiovascular;  Laterality: N/A;  . Left heart catheterization with coronary angiogram N/A 06/08/2014    Procedure: LEFT HEART CATHETERIZATION WITH CORONARY ANGIOGRAM;  Surgeon: Troy Sine, MD;  Location: [facility] CATH LAB;  Service: Cardiovascular;  Laterality: N/A;  . Tonsillectomy    . Appendectomy      Prior to Admission medications   Medication Sig Start Date End Date Taking? Authorizing Provider  acetaminophen (TYLENOL) 325 MG tablet Take 2  tablets (650 mg total) by mouth every 4 (four) hours as needed for headache or mild pain. 02/05/15  Yes Erlene Quan, PA-C  aspirin 81 MG EC tablet Take 81 mg by mouth daily.     Yes Historical Provider, MD  carvedilol (COREG) 3.125 MG tablet Take 1 tablet (3.125 mg total) by mouth 2 (two) times daily. 09/05/14   Troy Sine, MD  citalopram (CELEXA) 20 MG tablet Take 1 tablet (20 mg total) by mouth daily. 08/30/15   Aleksei Plotnikov V, MD  donepezil (ARICEPT) 5 MG tablet Take 1 tablet (5 mg total) by mouth at bedtime. 08/30/15   Aleksei Plotnikov V, MD  fluticasone (FLONASE) 50 MCG/ACT nasal  spray Place 1 spray into both nostrils daily as needed for allergies or rhinitis.    Historical Provider, MD  furosemide (LASIX) 40 MG tablet Take 1 tablet (40 mg total) by mouth 2 (two) times daily. 08/30/15   Aleksei Plotnikov V, MD  ketoconazole (NIZORAL) 2 % cream Apply 1 application topically daily. Under breasts 02/28/15   Lew Dawes V, MD  metFORMIN (GLUCOPHAGE) 500 MG tablet TAKE 1 TABLET TWICE DAILY BEFORE A MEAL 08/30/15   Aleksei Plotnikov V, MD  pantoprazole (PROTONIX) 40 MG tablet Take 1 tablet (40 mg total) by mouth daily. 08/30/15   Aleksei Plotnikov V, MD  potassium chloride SA (K-DUR,KLOR-CON) 20 MEQ tablet Take 1 tablet (20 mEq total) by mouth daily. 08/30/15   Aleksei Plotnikov V, MD  sacubitril-valsartan (ENTRESTO) 49-51 MG Take 1 tablet by mouth 2 (two) times daily. 09/05/15   Troy Sine, MD    Social History:  reports that she has never smoked. She has never used smokeless tobacco. She reports that she does not drink alcohol or use illicit drugs.  Family History  Problem Relation Age of Onset  . Hypertension Other   . CAD      father    Danley Danker Weights   10/29/15 1839  Weight: 77.111 kg (170 lb)    All the positives are listed in BOLD  Review of Systems:  HEENT: Headache, blurred vision, runny nose, sore throat Neck: Hypothyroidism, hyperthyroidism,,lymphadenopathy Chest : Shortness of breath, history of COPD, Asthma Heart : Chest pain, history of coronary arterey disease GI:  Nausea, vomiting, diarrhea, constipation, GERD GU: Dysuria, urgency, frequency of urination, hematuria Neuro: Stroke, seizures, syncope Psych: Depression, anxiety, hallucinations   Physical Exam: Blood pressure 160/72, pulse 72, temperature 97.8 F (36.6 C), temperature source Oral, resp. rate 19, height 5\' 5"  (1.651 m), weight 77.111 kg (170 lb), SpO2 99 %. Constitutional:   Patient is a well-developed and well-nourished female in no acute distress and cooperative with exam. Head:  Normocephalic and atraumatic Mouth: Mucus membranes moist Eyes: PERRL, EOMI, conjunctivae normal Neck: Supple, No Thyromegaly Cardiovascular: RRR, S1 normal, S2 normal Pulmonary/Chest: CTAB, no wheezes, rales, or rhonchi Abdominal: Soft. Non-tender, non-distended, bowel sounds are normal, no masses, organomegaly, or guarding present.  Neurological: A&O x3, Strength is normal and symmetric bilaterally, cranial nerve II-XII are grossly intact, no focal motor deficit, sensory intact to light touch bilaterally.  Extremities : No Cyanosis, Clubbing or Edema  Labs on Admission:  Basic Metabolic Panel:  Recent Labs Lab 10/29/15 1914  NA 141  K 3.6  CL 103  CO2 29  GLUCOSE 295*  BUN 22*  CREATININE 1.33*  CALCIUM 9.2   CBC:  Recent Labs Lab 10/29/15 1914  WBC 5.4  HGB 12.2  HCT 37.4  MCV 91.7  PLT 215  Cardiac Enzymes:  Recent Labs Lab 10/29/15 1914  TROPONINI <0.03    BNP (last 3 results)  Recent Labs  01/30/15 1208 04/18/15 1200 05/24/15 1018  BNP 508.3* 529.8* 424.0*    Radiological Exams on Admission: Dg Chest 2 View  10/29/2015  CLINICAL DATA:  Left chest pain and orthopnea. Coronary artery disease and ischemic cardiomyopathy. EXAM: CHEST  2 VIEW COMPARISON:  05/24/2015 FINDINGS: Heart size remains at the upper limits of normal. Both lungs are clear. No evidence of pneumothorax or pleural effusion. IMPRESSION: Stable exam.  No active disease. Electronically Signed   By: Earle Gell M.D.   On: 10/29/2015 19:47   Ct Angio Chest Pe W/cm &/or Wo Cm  10/29/2015  CLINICAL DATA:  82 year old female with left-sided chest and back pain. Symptom onset last night. EXAM: CT ANGIOGRAPHY CHEST WITH CONTRAST TECHNIQUE: Multidetector CT imaging of the chest was performed using the standard protocol during bolus administration of intravenous contrast. Multiplanar CT image reconstructions and MIPs were obtained to evaluate the vascular anatomy. CONTRAST:  47mL OMNIPAQUE IOHEXOL  350 MG/ML SOLN COMPARISON:  Radiographs earlier this day. Ventilation perfusion scan 01/31/2015, chest CT 08/17/2011 FINDINGS: There are no filling defects within the pulmonary arteries to suggest pulmonary embolus. Atherosclerosis of the thoracic aorta without aneurysm or dissection. There is distal aortic tortuosity. Coronary artery calcifications. Multi chamber cardiomegaly. No pericardial effusion. No mediastinal or hilar adenopathy. No pleural effusion. No consolidation to suggest pneumonia. Heterogeneous attenuation in the lower lobes and lingula, may reflect small airways disease versus pulmonary edema. There is minimal smooth septal thickening. No pulmonary mass or suspicious nodule. Hypodense left thyroid nodule measures 1.4 cm. Esophagus is patulous. Small hiatal hernia. No acute abnormality in the included upper abdomen. There are no acute or suspicious osseous abnormalities. Review of the MIP images confirms the above findings. IMPRESSION: 1. No pulmonary embolus. 2. Multi chamber cardiomegaly. Minimal heterogeneous attenuation of the lower lobes is no septal thickening, can be seen with small airways disease versus mild pulmonary edema. 3. Atherosclerosis of thoracic aorta. Coronary artery calcifications. Electronically Signed   By: Jeb Levering M.D.   On: 10/29/2015 21:02    EKG: Independently reviewed. Normal sinus rhythm   Assessment/Plan Active Problems:   Diabetes mellitus ([place])   Essential hypertension   Thoracic back pain after fall    Chest pain   Cardiomyopathy, ischemic-EF 30-35% by echo 01/31/15   Ischemic cardiomyopathy   Left side back pain Patient has history of thoracic compression fracture. Will also check left rib series to rule out rib fracture.  ? Chest pain Patient also has intermittent chest pain, will obtain serial cardiac enzymes to rule out ACS   History of ischemic cardiomyopathy Continue aspirin, Coreg, Entresto  Diabetes mellitus Continue sliding  scale insulin with NovoLog  DVT prophylaxis Lovenox  Code status: Full code  Family discussion: Admission, patients condition and plan of care including tests being ordered have been discussed with the patient and her husband at bedside* who indicate understanding and agree with the plan and Code Status.   Time Spent on Admission: 60 min  [facility] Pager: [phone] 10/29/2015, 10:09 PM  If 7PM-7AM, please contact night-coverage  [email or web address]  Password [email or web address]

## 2015-10-30 ENCOUNTER — Observation Stay (HOSPITAL_COMMUNITY): Payer: Commercial Managed Care - HMO

## 2015-10-30 ENCOUNTER — Observation Stay (HOSPITAL_BASED_OUTPATIENT_CLINIC_OR_DEPARTMENT_OTHER): Payer: Commercial Managed Care - HMO

## 2015-10-30 ENCOUNTER — Encounter (HOSPITAL_COMMUNITY): Payer: Self-pay | Admitting: Nurse Practitioner

## 2015-10-30 DIAGNOSIS — I10 Essential (primary) hypertension: Secondary | ICD-10-CM | POA: Diagnosis not present

## 2015-10-30 DIAGNOSIS — I255 Ischemic cardiomyopathy: Secondary | ICD-10-CM | POA: Diagnosis not present

## 2015-10-30 DIAGNOSIS — M546 Pain in thoracic spine: Secondary | ICD-10-CM | POA: Diagnosis not present

## 2015-10-30 DIAGNOSIS — I2510 Atherosclerotic heart disease of native coronary artery without angina pectoris: Secondary | ICD-10-CM

## 2015-10-30 DIAGNOSIS — R079 Chest pain, unspecified: Secondary | ICD-10-CM

## 2015-10-30 DIAGNOSIS — E1169 Type 2 diabetes mellitus with other specified complication: Secondary | ICD-10-CM | POA: Diagnosis not present

## 2015-10-30 DIAGNOSIS — S2231XA Fracture of one rib, right side, initial encounter for closed fracture: Secondary | ICD-10-CM | POA: Diagnosis not present

## 2015-10-30 LAB — NM MYOCAR MULTI W/SPECT W/WALL MOTION / EF
CHL CUP NUCLEAR SRS: 15
CHL CUP NUCLEAR SSS: 24
LHR: 0.31
LV sys vol: 146 mL
LVDIAVOL: 192 mL
NUC STRESS TID: 0.98
Peak HR: 93 {beats}/min
Rest HR: 69 {beats}/min
SDS: 9

## 2015-10-30 LAB — CBC
HEMATOCRIT: 34.8 % — AB (ref 36.0–46.0)
HEMOGLOBIN: 11.7 g/dL — AB (ref 12.0–15.0)
MCH: 30.6 pg (ref 26.0–34.0)
MCHC: 33.6 g/dL (ref 30.0–36.0)
MCV: 91.1 fL (ref 78.0–100.0)
Platelets: 185 10*3/uL (ref 150–400)
RBC: 3.82 MIL/uL — ABNORMAL LOW (ref 3.87–5.11)
RDW: 12.6 % (ref 11.5–15.5)
WBC: 5.6 10*3/uL (ref 4.0–10.5)

## 2015-10-30 LAB — COMPREHENSIVE METABOLIC PANEL
ALBUMIN: 3.1 g/dL — AB (ref 3.5–5.0)
ALK PHOS: 71 U/L (ref 38–126)
ALT: 11 U/L — ABNORMAL LOW (ref 14–54)
ANION GAP: 6 (ref 5–15)
AST: 16 U/L (ref 15–41)
BILIRUBIN TOTAL: 0.8 mg/dL (ref 0.3–1.2)
BUN: 18 mg/dL (ref 6–20)
CALCIUM: 8.9 mg/dL (ref 8.9–10.3)
CO2: 28 mmol/L (ref 22–32)
CREATININE: 1.17 mg/dL — AB (ref 0.44–1.00)
Chloride: 107 mmol/L (ref 101–111)
GFR calc Af Amer: 47 mL/min — ABNORMAL LOW (ref >60)
GFR calc non Af Amer: 41 mL/min — ABNORMAL LOW (ref >60)
GLUCOSE: 175 mg/dL — AB (ref 65–99)
Potassium: 3.5 mmol/L (ref 3.5–5.1)
Sodium: 141 mmol/L (ref 135–145)
TOTAL PROTEIN: 6.4 g/dL — AB (ref 6.5–8.1)

## 2015-10-30 LAB — TROPONIN I
TROPONIN I: 0.03 ng/mL (ref <0.031)
Troponin I: <0.03 ng/mL (ref <0.031)
Troponin I: 0.03 ng/mL (ref <0.031)

## 2015-10-30 LAB — GLUCOSE, CAPILLARY
GLUCOSE-CAPILLARY: 200 mg/dL — AB (ref 65–99)
Glucose-Capillary: 159 mg/dL — ABNORMAL HIGH (ref 65–99)
Glucose-Capillary: 148 mg/dL — ABNORMAL HIGH (ref 65–99)

## 2015-10-30 MED ORDER — TECHNETIUM TC 99M SESTAMIBI GENERIC - CARDIOLITE
10.0000 | Freq: Once | INTRAVENOUS | Status: AC | PRN
  Administered 2015-10-30: 10 via INTRAVENOUS

## 2015-10-30 MED ORDER — SODIUM CHLORIDE 0.9% FLUSH
INTRAVENOUS | Status: AC
Start: 2015-10-30 — End: 2015-10-30
  Administered 2015-10-30: 10 mL via INTRAVENOUS
  Filled 2015-10-30: qty 10

## 2015-10-30 MED ORDER — REGADENOSON 0.4 MG/5ML IV SOLN
INTRAVENOUS | Status: AC
  Administered 2015-10-30: 0.4 mg via INTRAVENOUS
  Filled 2015-10-30: qty 5

## 2015-10-30 MED ORDER — CARVEDILOL 3.125 MG PO TABS
6.2500 mg | ORAL_TABLET | Freq: Two times a day (BID) | ORAL | Status: DC
  Administered 2015-10-30 – 2015-10-31 (×2): 6.25 mg via ORAL
  Filled 2015-10-30 (×2): qty 2

## 2015-10-30 MED ORDER — HYDROMORPHONE HCL 1 MG/ML IJ SOLN: 1.0000 mg | INTRAMUSCULAR | Status: DC | PRN

## 2015-10-30 MED ORDER — TECHNETIUM TC 99M SESTAMIBI - CARDIOLITE
30.0000 | Freq: Once | INTRAVENOUS | Status: AC | PRN
  Administered 2015-10-30: 13:00:00 30 via INTRAVENOUS

## 2015-10-30 MED ORDER — TRAMADOL HCL 50 MG PO TABS
50.0000 mg | ORAL_TABLET | Freq: Four times a day (QID) | ORAL | Status: DC | PRN
  Administered 2015-10-30: 50 mg via ORAL
  Filled 2015-10-30: qty 1

## 2015-10-30 NOTE — Care Management Note (Signed)
Case Management Note  Patient Details  Name: LUVERTA MCQUEEN MRN: [id number] Date of Birth: 13-Dec-1925  Subjective/Objective:                  Admitted with CP. Pt is from home, lives with her husband and is ind with ADL's at baseline. Pt uses cane for ambulation. Pt has wheelchair and walker if needed. Pt has no home O2 or respiratory DME prior to admission. Pt has no HH services prior to admission.   Action/Plan: Pt plans to return home with self care at DC. No CM needs anticipated.   Expected Discharge Date:     10/31/2015             Expected Discharge Plan:  Home/Self Care  In-House Referral:     Discharge planning Services  CM Consult  Post Acute Care Choice:  NA Choice offered to:  NA  DME Arranged:    DME Agency:     HH Arranged:    HH Agency:     Status of Service:  In process, will continue to follow  Medicare Important Message Given:    Date Medicare IM Given:    Medicare IM give by:    Date Additional Medicare IM Given:    Additional Medicare Important Message give by:     If discussed at [place] of Stay Meetings, dates discussed:    Additional Comments:  Sherald Barge, RN 10/30/2015, 2:26 PM

## 2015-10-30 NOTE — Progress Notes (Signed)
Patient lost IV access.  RN and [facility] attempted without success to restart IV access.  Dr. Marin Comment notified via text paged.  Dr. Marin Comment gave order to leave IV out at this time and notify day hospitalist in reference to needing to restart IV access.

## 2015-10-30 NOTE — Progress Notes (Signed)
Nuclear stress with primarily inferolateral infarct consistent with her prior LCX MI, fairly mild amount of myocardium currently at jeopardy. High risk only due to low LVEF which is chronic for her. Findings fairly similar to her previous nuke, where she had a f/u cath done and no new obstructive disease note. Recommend continued medical therapy at this time. Increase coreg to 6.25mg  bid in setting of CHF as well as antianginal effects. She may f/u with [place] in 2-3 weeks and then be reestablished with Dr Claiborne Billings after. I have contacted our secretary to schedule. We will sign off inpatient care.    Zandra Abts MD

## 2015-10-30 NOTE — Care Management Obs Status (Signed)
[place] NOTIFICATION   Patient Details  Name: Mary Cook MRN: [id number] Date of Birth: 1925-09-28   Medicare Observation Status Notification Given:  Yes    Sherald Barge, RN 10/30/2015, 2:25 PM

## 2015-10-30 NOTE — Progress Notes (Signed)
Patient was leaving room for a test. Her husband shared she has Advance Directives. I asked that he bring a copy to have as a part of her chart. He stated he would.

## 2015-10-30 NOTE — Progress Notes (Signed)
[facility]                                                                              Patient Demographics  Mary Cook, is a 82 y.o. female, DOB - 1925-10-14, [id number]  Admit date - 10/29/2015   Admitting Physician Oswald Hillock, MD  Outpatient Primary MD for the patient is Walker Kehr, MD  LOS -    Chief Complaint  Patient presents with  . Shoulder Pain       Brief HPI  Patient is a 82 year old female with hypertension, type 2 diabetes mellitus, CAD, hyperlipidemia, chronic systolic CHF, ischemic cardiomyopathy presented with left-sided back pain and chest pain. Patient described the pain as sharp and stabbing. CT and exam of the chest was negative for pulmonary embolism. Cardiac enzymes negative. Patient was admitted for further workup.    Assessment & Plan      Chest pain: Somewhat atypical, however has significant risk factors hypertension, type 2 diabetes, CAD, ischemic cardiomyopathy - NPO, started cardiology, planning stress test today - Serial cardiac enzymes negative for acute ACS - Patient was continued on aspirin, Coreg, nitroglycerin patch, ARB    Thoracic back pain after fall  - CT of the chest did not show any pulmonary embolism, small airway disease versus mild pulmonary edema no acute or suspicious ossues abnormalities. - Obtain x-ray of the thoracic spine, left-sided rib series    Diabetes mellitus ([place]) - Continue sliding scale insulin, follow hemoglobin A1c    Essential hypertension - Currently stable, continue Coreg,ARB,lasix    Cardiomyopathy, ischemic-EF 30-35% by echo 01/31/15 - Cardiology consulted, planning stress test  Code Status:full code   Family Communication: Discussed in detail with the patient, all imaging results, lab results explained to the patient   Disposition Plan:   Time Spent in minutes  25 minutes  Procedures  Stress test  Consults   cardiology  DVT Prophylaxis  Lovenox    Medications  Scheduled Meds: . aspirin EC  81 mg Oral Daily  . carvedilol  3.125 mg Oral BID  . citalopram  20 mg Oral Daily  . cyclobenzaprine  5 mg Oral TID  . donepezil  5 mg Oral QHS  . enoxaparin (LOVENOX) injection  40 mg Subcutaneous Q24H  . furosemide  40 mg Oral BID  . insulin aspart  0-9 Units Subcutaneous TID WC  . pantoprazole  40 mg Oral Daily  . potassium chloride SA  20 mEq Oral Daily  . sacubitril-valsartan  1 tablet Oral BID   Continuous Infusions:  PRN Meds:.acetaminophen, fluticasone, ondansetron (ZOFRAN) IV   Antibiotics   Anti-infectives    None        Subjective:   Mary Cook was seen and examined today.  Complaining of Left Upper Back Pain Otherwise Shortness of Breath, Nausea, Vomiting. Patient denies any dizziness, shortness of breath, nausea, vomiting or abdominal pain.    Objective:   Filed Vitals:   10/29/15 2100 10/29/15 2257 10/29/15 2303 10/30/15 0606  BP: 160/72 169/83  136/71  Pulse: 72 68  66  Temp:  98.1 F (36.7 C)  97.7 F (36.5 C)  TempSrc:  Oral  Oral  Resp: 19 20  20   Height:   5\' 5"  (1.651 m)   Weight:   78.5 kg (173 lb 1 oz)   SpO2: 99% 98%  98%   No intake or output data in the 24 hours ending 10/30/15 0931   Wt Readings from Last 3 Encounters:  10/29/15 78.5 kg (173 lb 1 oz)  08/30/15 78.926 kg (174 lb)  07/27/15 77.111 kg (170 lb)     Exam  General: Alert and oriented x 3, NAD  HEENT:  PERRLA, EOMI, Anicteric Sclera, mucous membranes moist.   Neck: Supple, no JVD, no masses  CVS: S1 S2 auscultated, no rubs, murmurs or gallops. Regular rate and rhythm.  Respiratory: Clear to auscultation bilaterally, no wheezing, rales or rhonchi  Abdomen: Soft, nontender, nondistended, + bowel sounds  Ext: no cyanosis clubbing or edema  Neuro: AAOx3, Cr N's II- XII. Strength 5/5 upper and lower extremities bilaterally  Skin: No rashes  Psych: Normal affect and demeanor, alert and oriented x3     Data Review   Micro Results No results found for this or any previous visit (from the past 240 hour(s)).  Radiology Reports Dg Chest 2 View  10/29/2015  CLINICAL DATA:  Left chest pain and orthopnea. Coronary artery disease and ischemic cardiomyopathy. EXAM: CHEST  2 VIEW COMPARISON:  05/24/2015 FINDINGS: Heart size remains at the upper limits of normal. Both lungs are clear. No evidence of pneumothorax or pleural effusion. IMPRESSION: Stable exam.  No active disease. Electronically Signed   By: Earle Gell M.D.   On: 10/29/2015 19:47   Ct Angio Chest Pe W/cm &/or Wo Cm  10/29/2015  CLINICAL DATA:  82 year old female with left-sided chest and back pain. Symptom onset last night. EXAM: CT ANGIOGRAPHY CHEST WITH CONTRAST TECHNIQUE: Multidetector CT imaging of the chest was performed using the standard protocol during bolus administration of intravenous contrast. Multiplanar CT image reconstructions and MIPs were obtained to evaluate the vascular anatomy. CONTRAST:  14mL OMNIPAQUE IOHEXOL 350 MG/ML SOLN COMPARISON:  Radiographs earlier this day. Ventilation perfusion scan 01/31/2015, chest CT 08/17/2011 FINDINGS: There are no filling defects within the pulmonary arteries to suggest pulmonary embolus. Atherosclerosis of the thoracic aorta without aneurysm or dissection. There is distal aortic tortuosity. Coronary artery calcifications. Multi chamber cardiomegaly. No pericardial effusion. No mediastinal or hilar adenopathy. No pleural effusion. No consolidation to suggest pneumonia. Heterogeneous attenuation in the lower lobes and lingula, may reflect small airways disease versus pulmonary edema. There is minimal smooth septal thickening. No pulmonary mass or suspicious nodule. Hypodense left thyroid nodule measures 1.4 cm. Esophagus is patulous. Small hiatal hernia. No acute abnormality in the included upper abdomen. There are no acute or suspicious osseous abnormalities. Review of the MIP images confirms  the above findings. IMPRESSION: 1. No pulmonary embolus. 2. Multi chamber cardiomegaly. Minimal heterogeneous attenuation of the lower lobes is no septal thickening, can be seen with small airways disease versus mild pulmonary edema. 3. Atherosclerosis of thoracic aorta. Coronary artery calcifications. Electronically Signed   By: Jeb Levering M.D.   On: 10/29/2015 21:02    CBC  Recent Labs Lab 10/29/15 1914 10/30/15 0516  WBC 5.4 5.6  HGB 12.2 11.7*  HCT 37.4 34.8*  PLT 215 185  MCV 91.7 91.1  MCH 29.9 30.6  MCHC 32.6 33.6  RDW 12.6 12.6    Chemistries   Recent Labs Lab 10/29/15 1914 10/30/15 0516  NA 141 141  K 3.6 3.5  CL 103 107  CO2 29 28  GLUCOSE 295* 175*  BUN 22* 18  CREATININE 1.33* 1.17*  CALCIUM 9.2 8.9  AST  --  16  ALT  --  11*  ALKPHOS  --  71  BILITOT  --  0.8   ------------------------------------------------------------------------------------------------------------------ estimated creatinine clearance is 35.1 mL/min (by C-G formula based on Cr of 1.17). ------------------------------------------------------------------------------------------------------------------ No results for input(s): HGBA1C in the last 72 hours. ------------------------------------------------------------------------------------------------------------------ No results for input(s): CHOL, HDL, LDLCALC, TRIG, CHOLHDL, LDLDIRECT in the last 72 hours. ------------------------------------------------------------------------------------------------------------------ No results for input(s): TSH, T4TOTAL, T3FREE, THYROIDAB in the last 72 hours.  Invalid input(s): FREET3 ------------------------------------------------------------------------------------------------------------------ No results for input(s): VITAMINB12, FOLATE, FERRITIN, TIBC, IRON, RETICCTPCT in the last 72 hours.  Coagulation profile No results for input(s): INR, PROTIME in the last 168 hours.  No results  for input(s): DDIMER in the last 72 hours.  Cardiac Enzymes  Recent Labs Lab 10/29/15 1914 10/29/15 2327 10/30/15 0516  TROPONINI <0.03 <0.03 0.03   ------------------------------------------------------------------------------------------------------------------ Invalid input(s): POCBNP  No results for input(s): GLUCAP in the last 72 hours.   RAI,RIPUDEEP M.D. [facility] 10/30/2015, 9:31 AM  Pager: [phone] Between 7am to 7pm - call Pager - [phone]  After 7pm go to [email or web address] - password [email or web address]  Call night coverage person covering after 7pm

## 2015-10-30 NOTE — Consult Note (Signed)
Primary cardiologist: Dr Shelva Majestic Consulting cardiologist: Dr Carlyle Dolly  Requesting physician: Dr Eleonore Chiquito    Clinical Summary Mary Cook is a 82 y.o.female history of CAD with MI in 04/2010, received DES x 2 to LCX. Last cath 05/2014 after abnormal nuclear study showed LM patent, LAD patent, D1 50-70% stable from prior cath, LCX patent stents, AV groove small with 70% stenosis, RCA 50%. Recs were for medical therapy at that time. She also has a history of chronic systolic HF with LVEF [id number],  hyperlipidemia, HTN, DM2, history of CVA, admitted with chest pain.  She reports onset of sharp left rib/flank pain that came on yesterday while mopping. This has been a constant pain since onset, worst with deep breathing. Around the same time she describes a separate chest pressure in left chest 2-3/10 that lasted several minutes. Associated with SOB. Over the last few months she reports progressing DOE as well.   Hgb 12.2, Plt 215, Cr 1.33, K 3.6, trop neg x 3 CXR no acute process CT PE no PE 01/2015 echo: LVEF 30-35%, inferolateral hypokinesis, abnormal diastolic function EKG SR, LAFB  Allergies  Allergen Reactions  . Clopidogrel Bisulfate Nausea And Vomiting    Patient is not aware of this allergy  . Lipitor [Atorvastatin] Other (See Comments)    Weak legs  . Lisinopril Cough    Patient is not aware of this allergy    Medications Scheduled Medications: . aspirin EC  81 mg Oral Daily  . carvedilol  3.125 mg Oral BID  . citalopram  20 mg Oral Daily  . cyclobenzaprine  5 mg Oral TID  . donepezil  5 mg Oral QHS  . enoxaparin (LOVENOX) injection  40 mg Subcutaneous Q24H  . furosemide  40 mg Oral BID  . insulin aspart  0-9 Units Subcutaneous TID WC  . pantoprazole  40 mg Oral Daily  . potassium chloride SA  20 mEq Oral Daily  . sacubitril-valsartan  1 tablet Oral BID     Infusions:     PRN Medications:  acetaminophen, fluticasone, ondansetron (ZOFRAN)  IV   Past Medical History  Diagnosis Date  . Essential hypertension   . Osteoarthritis   . Osteopenia   . Diverticulosis of colon   . Type II diabetes mellitus ([place])   . CAD (coronary artery disease)     a. 04/2010 MI DES x 2 to LCX;  b. 05/2014 Cath: EF 35%, patent LCX stents w/ 70% stenosis in small AV groove LCX, Diag 50-60, RCA 50-60p->Med Rx.  Marland Kitchen Hyperlipidemia   . GIB (gastrointestinal bleeding)     a. 2002 Diverticular bleed.  . Skin disorder   . Visual disorder   . Thrombus   . Sleep apnea     cpap therapy  . Frequent urination   . Cerebral aneurysm   . Stroke ([place]) 2002  . Chronic systolic CHF (congestive heart failure) ([place])     a. 01/2015 Echo: EF 30-35%, sev inflat HK, Gr 1DD, mild AI.  . Ischemic cardiomyopathy     a. 01/2015 Echo: EF 30-35%.  . T12 compression fracture ([place])     a. 04/2015.    Past Surgical History  Procedure Laterality Date  . Abdominal hysterectomy  1980    no bso  . Coronary angioplasty with stent placement  05/11/2010    stent CFX  . Cardiac catheterization  07/2011    LAD OK, D1 80%, CFX 30% w/ patent stent, RCA 50-60%, EF 40%  .  Thrombectomy    . Cardiac catheterization    . Doppler echocardiography    . Myocardial perfusion study    . Left heart catheterization with coronary angiogram N/A 08/18/2011    Procedure: LEFT HEART CATHETERIZATION WITH CORONARY ANGIOGRAM;  Surgeon: Wellington Hampshire, MD;  Location: [facility] CATH LAB;  Service: Cardiovascular;  Laterality: N/A;  . Left heart catheterization with coronary angiogram N/A 06/08/2014    Procedure: LEFT HEART CATHETERIZATION WITH CORONARY ANGIOGRAM;  Surgeon: Troy Sine, MD;  Location: [facility] C[facility] LAB;  Service: Cardiovascular;  Laterality: N/A;  . Tonsillectomy    . Appendectomy      Family History  Problem Relation Age of Onset  . Hypertension Other   . CAD      father    Social History Ms. Rhodes reports that she has never smoked. She has never used smokeless tobacco. Ms.  Memory reports that she does not drink alcohol.  Review of Systems CONSTITUTIONAL: No weight loss, fever, chills, weakness or fatigue.  HEENT: Eyes: No visual loss, blurred vision, double vision or yellow sclerae. No hearing loss, sneezing, congestion, runny nose or sore throat.  SKIN: No rash or itching.  CARDIOVASCULAR: per HPI  RESPIRATORY: No shortness of breath, cough or sputum.  GASTROINTESTINAL: No anorexia, nausea, vomiting or diarrhea. No abdominal pain or blood.  GENITOURINARY: no polyuria, no dysuria NEUROLOGICAL: No headache, dizziness, syncope, paralysis, ataxia, numbness or tingling in the extremities. No change in bowel or bladder control.  MUSCULOSKELETAL: No muscle, back pain, joint pain or stiffness.  HEMATOLOGIC: No anemia, bleeding or bruising.  LYMPHATICS: No enlarged nodes. No history of splenectomy.  PSYCHIATRIC: No history of depression or anxiety.      Physical Examination Blood pressure 136/71, pulse 66, temperature 97.7 F (36.5 C), temperature source Oral, resp. rate 20, height 5\' 5"  (1.651 m), weight 173 lb 1 oz (78.5 kg), SpO2 98 %. No intake or output data in the 24 hours ending 10/30/15 0925  HEENT: sclera clear, throat clear  Cardiovascular: RRR, no m/r,g no jvd  Respiratory: CTAB  GI: abdomen soft, Nt, ND  MSK: no LE edema  Neuro: no focal deficits  Psych: appropriate affect   Lab Results  Basic Metabolic Panel:  Recent Labs Lab 10/29/15 1914 10/30/15 0516  NA 141 141  K 3.6 3.5  CL 103 107  CO2 29 28  GLUCOSE 295* 175*  BUN 22* 18  CREATININE 1.33* 1.17*  CALCIUM 9.2 8.9    Liver Function Tests:  Recent Labs Lab 10/30/15 0516  AST 16  ALT 11*  ALKPHOS 71  BILITOT 0.8  PROT 6.4*  ALBUMIN 3.1*    CBC:  Recent Labs Lab 10/29/15 1914 10/30/15 0516  WBC 5.4 5.6  HGB 12.2 11.7*  HCT 37.4 34.8*  MCV 91.7 91.1  PLT 215 185    Cardiac Enzymes:  Recent Labs Lab 10/29/15 1914 10/29/15 2327  10/30/15 0516  TROPONINI <0.03 <0.03 0.03    BNP: Invalid input(s): POCBNP    Impression/Recommendations 1. Chest pain - 2 separate pains. One is left rib/flank pain that is not consistent with cardiac chest pain. The other is left sided chest pressure associated with some SOB that could represent recurrent ischemic disease, especially given her several month history of progression DOE - no evidence of ACS by EKG or enzymes - we will plan for Lexiscan MPI to further evaluate and risk stratify.   Carlyle Dolly, M.D.

## 2015-10-31 DIAGNOSIS — I10 Essential (primary) hypertension: Secondary | ICD-10-CM | POA: Diagnosis not present

## 2015-10-31 DIAGNOSIS — I255 Ischemic cardiomyopathy: Secondary | ICD-10-CM | POA: Diagnosis not present

## 2015-10-31 DIAGNOSIS — E1169 Type 2 diabetes mellitus with other specified complication: Secondary | ICD-10-CM

## 2015-10-31 DIAGNOSIS — M546 Pain in thoracic spine: Secondary | ICD-10-CM | POA: Diagnosis not present

## 2015-10-31 DIAGNOSIS — R079 Chest pain, unspecified: Secondary | ICD-10-CM | POA: Diagnosis not present

## 2015-10-31 LAB — HEMOGLOBIN A1C
HEMOGLOBIN A1C: 8.3 % — AB (ref 4.8–5.6)
MEAN PLASMA GLUCOSE: 192 mg/dL

## 2015-10-31 LAB — GLUCOSE, CAPILLARY: GLUCOSE-CAPILLARY: 176 mg/dL — AB (ref 65–99)

## 2015-10-31 MED ORDER — TRAMADOL HCL 50 MG PO TABS: 50.0000 mg | ORAL_TABLET | Freq: Three times a day (TID) | ORAL | Status: DC | PRN

## 2015-10-31 MED ORDER — UNABLE TO FIND: Status: DC

## 2015-10-31 MED ORDER — CYCLOBENZAPRINE HCL 5 MG PO TABS: 5.0000 mg | ORAL_TABLET | Freq: Three times a day (TID) | ORAL | Status: DC

## 2015-10-31 MED ORDER — CARVEDILOL 6.25 MG PO TABS: 6.2500 mg | ORAL_TABLET | Freq: Two times a day (BID) | ORAL | Status: DC

## 2015-10-31 NOTE — Progress Notes (Signed)
Discharged PT per MD order and protocol. Reviewed discharge teaching and handouts given. Pt verbalized understanding and left with all belongings. Prescriptions given and explained. VSS. IV catheter D/C.  Patient wheeled down by staff member. Oswald Hillock, RN

## 2015-10-31 NOTE — Evaluation (Signed)
Physical Therapy Evaluation Patient Details Name: Mary Cook MRN: [id number] DOB: 07-10-1926 Today's Date: 10/31/2015   History of Present Illness  82yo white female comes to [facility] p 24hrs L back/chest pain, with SOB. CT this visit reveals R posteriolateral nondisplaced fracture of rib 8. PMH: HTN, OA, osteopenia, DM, CAD, HLD, GIB, OSA, cerebral aneurysm x2, CVA, CHF, ischemic CM, T12 compression fracture (55m), and EF 30-35%.   Clinical Impression  At evaluation, pt is received semirecumbent in bed upon entry, family/caregiver present. The pt is awake and agreeable to participate. 7/10 stable pain noted at this time. The pt is alert and oriented x3, pleasant, conversational, and following simple and multi-step commands consistently, but has some memory impairment, which husband attributes to history of BI. Pt reports history of falls, no LOB noted during this session, but appearing generally unstable with gait, moderate reliance on RW for stability. Pt global strength as screened during functional mobility assessment presents with mild impairment, most notably during gait, where power output is poor. This has been the case [facility] which mobility decline was never addressed, therapy never taken. Pt received on 2L O2, however moved to RA where she remained throughout evaluation, with SaO2 at 98% throughout with all activity, despite worsening DOE: anemia is ruled out by recent labs, and considering limited EF as the cause.   Patient presenting with impairment of strength, range of motion, balance, oxygen perfusion, and activity tolerance, limiting ability to perform ADL and mobility tasks at  baseline level of function. Patient will benefit from skilled intervention to address the above impairments and limitations, in order to restore to prior level of function, improve patient safety upon discharge, and to decrease falls risk.       Follow  Up Recommendations Supervision - Intermittent;Supervision/Assistance - 24 hour;Outpatient PT    Equipment Recommendations  None recommended by PT    Recommendations for Other Services       Precautions / Restrictions Precautions Precautions: Fall Restrictions Weight Bearing Restrictions: No      Mobility  Bed Mobility Overal bed mobility: Independent                Transfers Overall transfer level: Independent                  Ambulation/Gait Ambulation/Gait assistance: Min guard Ambulation Distance (Feet): 140 Feet Assistive device: Rolling walker (2 wheeled)   Gait velocity: 0.234m  Gait velocity interpretation: <1.8 ft/sec, indicative of risk for recurrent falls    Stairs            Wheelchair Mobility    Modified Rankin (Stroke Patients Only)       Balance Overall balance assessment: No apparent balance deficits (not formally assessed)                                           Pertinent Vitals/Pain Pain Assessment: 0-10 Pain Score: 7  Pain Location: L sided chest and back pain.  Pain Intervention(s): Limited activity within patient's tolerance;Monitored during session;Premedicated before session    HoProctorxpects to be discharged to:: Private residence Living Arrangements: Spouse/significant other Available Help at Discharge: Family Type of Home: House Home Access: Level entry     Home Layout: Two level;Able to live on main level with bedroom/bathroom Home Equipment: CaKasandra Knudsen single point Additional  Comments: second hand WC, and a 3 wheeled walker.     Prior Function Level of Independence: Needs assistance   Gait / Transfers Assistance Needed: Limited community distance AMB with SPC, slow and easily fatigued.   ADL's / Homemaking Assistance Needed: Indep, husband does most cooking.         Hand Dominance        Extremity/Trunk Assessment                          Communication   Communication: No difficulties  Cognition Arousal/Alertness: Awake/alert Behavior During Therapy: WFL for tasks assessed/performed Overall Cognitive Status: Within Functional Limits for tasks assessed                      General Comments      Exercises        Assessment/Plan    PT Assessment All further PT needs can be met in the next venue of care  PT Diagnosis Difficulty walking;Generalized weakness;Abnormality of gait;Acute pain   PT Problem List Decreased strength;Decreased mobility;Decreased range of motion;Decreased activity tolerance;Cardiopulmonary status limiting activity;Pain  PT Treatment Interventions     PT Goals (Current goals can be found in the Care Plan section) Acute Rehab PT Goals PT Goal Formulation: All assessment and education complete, DC therapy    Frequency     Barriers to discharge        Co-evaluation               End of Session Equipment Utilized During Treatment: Gait belt Activity Tolerance: Patient tolerated treatment well;No increased pain;Patient limited by fatigue Patient left: in bed;with call bell/phone within reach;with family/visitor present;with bed alarm set Nurse Communication: Mobility status;Other (comment)    Functional Assessment Tool Used: clinical judgment  Functional Limitation: Mobility: Walking and moving around Mobility: Walking and Moving Around Current Status [phone]): At least 40 percent but less than 60 percent impaired, limited or restricted Mobility: Walking and Moving Around Goal Status [phone]): At least 40 percent but less than 60 percent impaired, limited or restricted Mobility: Walking and Moving Around Discharge Status [phone]): At least 40 percent but less than 60 percent impaired, limited or restricted    Time: 0935-0957 PT Time Calculation (min) (ACUTE ONLY): 22 min   Charges:   PT Evaluation $PT Eval Moderate Complexity: 1 Procedure PT Treatments $Therapeutic  Activity: 8-22 mins   PT G Codes:   PT G-Codes **NOT FOR INPATIENT CLASS** Functional Assessment Tool Used: clinical judgment  Functional Limitation: Mobility: Walking and moving around Mobility: Walking and Moving Around Current Status ([id number]): At least 40 percent but less than 60 percent impaired, limited or restricted Mobility: Walking and Moving Around Goal Status [phone]): At least 40 percent but less than 60 percent impaired, limited or restricted Mobility: Walking and Moving Around Discharge Status [phone]): At least 40 percent but less than 60 percent impaired, limited or restricted    5:25 PM, 10/31/2015 Etta Grandchild, PT, DPT PRN Physical Therapist at [place] License # [id number] [phone] (wireless)  [phone] (mobile)

## 2015-10-31 NOTE — Discharge Summary (Signed)
Physician Discharge Summary   Patient ID: Mary Cook MRN: [id number] DOB/AGE: 80-Sep-1927 80 y.o.  Admit date: 10/29/2015 Discharge date: 10/31/2015  Primary Care Physician:  Walker Kehr, MD  Discharge Diagnoses:    . Atypical Chest pain . Thoracic back pain after fall  . Ischemic cardiomyopathy . Essential hypertension . Cardiomyopathy, ischemic-EF 30-35% by echo 01/31/15    8th rib fracture   Consults: Cardiology  Recommendations for Outpatient Follow-up:  1. please repeat CBC/BMET at next visit   DIET: Heart healthy  Allergies:   Allergies  Allergen Reactions  . Clopidogrel Bisulfate Nausea And Vomiting    Patient is not aware of this allergy  . Lipitor [Atorvastatin] Other (See Comments)    Weak legs  . Lisinopril Cough    Patient is not aware of this allergy     DISCHARGE MEDICATIONS: Current Discharge Medication List    START taking these medications   Details  cyclobenzaprine (FLEXERIL) 5 MG tablet Take 1 tablet (5 mg total) by mouth 3 (three) times daily. Qty: 30 tablet, Refills: 0    traMADol (ULTRAM) 50 MG tablet Take 1 tablet (50 mg total) by mouth every 8 (eight) hours as needed for moderate pain or severe pain. Qty: 30 tablet, Refills: 0      CONTINUE these medications which have CHANGED   Details  carvedilol (COREG) 6.25 MG tablet Take 1 tablet (6.25 mg total) by mouth 2 (two) times daily with a meal. Qty: 60 tablet, Refills: 3      CONTINUE these medications which have NOT CHANGED   Details  acetaminophen (TYLENOL) 325 MG tablet Take 2 tablets (650 mg total) by mouth every 4 (four) hours as needed for headache or mild pain.    aspirin 81 MG EC tablet Take 81 mg by mouth daily.      pantoprazole (PROTONIX) 40 MG tablet Take 1 tablet (40 mg total) by mouth daily. Qty: 90 tablet, Refills: 3    sacubitril-valsartan (ENTRESTO) 49-51 MG Take 1 tablet by mouth 2 (two) times daily. Qty: 60 tablet, Refills: 11    citalopram (CELEXA) 20  MG tablet Take 1 tablet (20 mg total) by mouth daily. Qty: 90 tablet, Refills: 3    donepezil (ARICEPT) 5 MG tablet Take 1 tablet (5 mg total) by mouth at bedtime. Qty: 90 tablet, Refills: 3    fluticasone (FLONASE) 50 MCG/ACT nasal spray Place 1 spray into both nostrils daily as needed for allergies or rhinitis.    furosemide (LASIX) 40 MG tablet Take 1 tablet (40 mg total) by mouth 2 (two) times daily. Qty: 90 tablet, Refills: 3   Associated Diagnoses: Falls frequently; Leg weakness, bilateral; [name] deficiency; Disorder of bone and cartilage; Coronary artery disease due to lipid rich plaque; Type 1 diabetes mellitus with diabetic chronic kidney disease, unspecified CKD stage (HCC)    ketoconazole (NIZORAL) 2 % cream Apply 1 application topically daily. Under breasts Qty: 45 g, Refills: 1    metFORMIN (GLUCOPHAGE) 500 MG tablet TAKE 1 TABLET TWICE DAILY BEFORE A MEAL Qty: 180 tablet, Refills: 3    potassium chloride SA (K-DUR,KLOR-CON) 20 MEQ tablet Take 1 tablet (20 mEq total) by mouth daily. Qty: 90 tablet, Refills: 3         Brief H and P: For complete details please refer to admission H and P, but in brief Patient is a 82 year old female with hypertension, type 2 diabetes mellitus, CAD, hyperlipidemia, chronic systolic CHF, ischemic cardiomyopathy presented with left-sided back pain and chest  pain. Patient described the pain as sharp and stabbing. CT and exam of the chest was negative for pulmonary embolism. Cardiac enzymes negative. Patient was admitted for further workup.  Hospital Course:  Chest pain: Somewhat atypical, however has significant risk factors hypertension, type 2 diabetes, CAD, ischemic cardiomyopathy - Currently resolved. - Serial cardiac enzymes negative for acute ACS - Patient was continued on aspirin, Coreg, nitroglycerin patch, ARB - Cardiology was consulted, patient underwent nuclear medicine stress test. Per Dr Harl Bowie, Nuclear stress with primarily  inferolateral infarct consistent with her prior LCX MI, fairly mild amount of myocardium currently at jeopardy. High risk only due to low LVEF which is chronic for her. Findings fairly similar to her previous nuke, where she had a f/u cath done and no new obstructive disease note. Recommend continued medical therapy at this time. Increase coreg to 6.25mg  bid in setting of CHF as well as antianginal effects. She may f/u with [place] in 2-3 weeks.     Thoracic back pain after fall  - CT of the chest did not show any pulmonary embolism, small airway disease versus mild pulmonary edema no acute or suspicious ossues abnormalities. - x-ray of the thoracic spine negative, rib series showed 8th right rib fracture  - Placed on tramadol for pain   Diabetes mellitus ([place]) -  hemoglobin A1c 8.3, patient was placed on sliding scale insulin   Essential hypertension - Currently stable, continue Coreg,ARB,lasix   Cardiomyopathy, ischemic-EF 30-35% by echo 01/31/15 - Cardiology consulted, please see #1, medical management recommended.  Day of Discharge BP 154/63 mmHg  Pulse 68  Temp(Src) 98.2 F (36.8 C) (Oral)  Resp 20  Ht 5\' 5"  (1.651 m)  Wt 78.5 kg (173 lb 1 oz)  BMI 28.80 kg/m2  SpO2 94%  Physical Exam: General: Alert and awake oriented x3 not in any acute distress. HEENT: anicteric sclera, pupils reactive to light and accommodation CVS: S1-S2 clear no murmur rubs or gallops Chest: clear to auscultation bilaterally, no wheezing rales or rhonchi Abdomen: soft nontender, nondistended, normal bowel sounds Extremities: no cyanosis, clubbing or edema noted bilaterally Neuro: Cranial nerves II-XII intact, no focal neurological deficits   The results of significant diagnostics from this hospitalization (including imaging, microbiology, ancillary and laboratory) are listed below for reference.    LAB RESULTS: Basic Metabolic Panel:  Recent Labs Lab 10/29/15 1914 10/30/15 0516  NA 141 141  K  3.6 3.5  CL 103 107  CO2 29 28  GLUCOSE 295* 175*  BUN 22* 18  CREATININE 1.33* 1.17*  CALCIUM 9.2 8.9   Liver Function Tests:  Recent Labs Lab 10/30/15 0516  AST 16  ALT 11*  ALKPHOS 71  BILITOT 0.8  PROT 6.4*  ALBUMIN 3.1*   No results for input(s): LIPASE, AMYLASE in the last 168 hours. No results for input(s): AMMONIA in the last 168 hours. CBC:  Recent Labs Lab 10/29/15 1914 10/30/15 0516  WBC 5.4 5.6  HGB 12.2 11.7*  HCT 37.4 34.8*  MCV 91.7 91.1  PLT 215 185   Cardiac Enzymes:  Recent Labs Lab 10/30/15 0516 10/30/15 1105  TROPONINI 0.03 0.03   BNP: Invalid input(s): POCBNP CBG:  Recent Labs Lab 10/30/15 2051 10/31/15 0732  GLUCAP 200* 176*    Significant Diagnostic Studies:  Dg Chest 2 View  10/29/2015  CLINICAL DATA:  Left chest pain and orthopnea. Coronary artery disease and ischemic cardiomyopathy. EXAM: CHEST  2 VIEW COMPARISON:  05/24/2015 FINDINGS: Heart size remains at the upper limits of  normal. Both lungs are clear. No evidence of pneumothorax or pleural effusion. IMPRESSION: Stable exam.  No active disease. Electronically Signed   By: Earle Gell M.D.   On: 10/29/2015 19:47   Dg Ribs Unilateral Left  10/30/2015  CLINICAL DATA:  Recent fall, pain right posterior ribs around T8 level EXAM: LEFT RIBS - 2 VIEW COMPARISON:  None. FINDINGS: Hairline fracture posterior lateral right 8th rib. No hemo- or pneumothorax on the right. IMPRESSION: Nondisplaced rib fracture. Electronically Signed   By: Skipper Cliche M.D.   On: 10/30/2015 13:41   Dg Thoracic Spine 4v  10/30/2015  CLINICAL DATA:  Upper back pain after recent fall. EXAM: THORACIC SPINE - 4+ VIEW COMPARISON:  None. FINDINGS: No fracture or spondylolisthesis is noted. Disc spaces are well-maintained. Mild osteophyte formation is noted inferiorly. IMPRESSION: No acute abnormality seen in the thoracic spine. Electronically Signed   By: Marijo Conception, M.D.   On: 10/30/2015 13:36   Ct Angio  Chest Pe W/cm &/or Wo Cm  10/29/2015  CLINICAL DATA:  82 year old female with left-sided chest and back pain. Symptom onset last night. EXAM: CT ANGIOGRAPHY CHEST WITH CONTRAST TECHNIQUE: Multidetector CT imaging of the chest was performed using the standard protocol during bolus administration of intravenous contrast. Multiplanar CT image reconstructions and MIPs were obtained to evaluate the vascular anatomy. CONTRAST:  38mL OMNIPAQUE IOHEXOL 350 MG/ML SOLN COMPARISON:  Radiographs earlier this day. Ventilation perfusion scan 01/31/2015, chest CT 08/17/2011 FINDINGS: There are no filling defects within the pulmonary arteries to suggest pulmonary embolus. Atherosclerosis of the thoracic aorta without aneurysm or dissection. There is distal aortic tortuosity. Coronary artery calcifications. Multi chamber cardiomegaly. No pericardial effusion. No mediastinal or hilar adenopathy. No pleural effusion. No consolidation to suggest pneumonia. Heterogeneous attenuation in the lower lobes and lingula, may reflect small airways disease versus pulmonary edema. There is minimal smooth septal thickening. No pulmonary mass or suspicious nodule. Hypodense left thyroid nodule measures 1.4 cm. Esophagus is patulous. Small hiatal hernia. No acute abnormality in the included upper abdomen. There are no acute or suspicious osseous abnormalities. Review of the MIP images confirms the above findings. IMPRESSION: 1. No pulmonary embolus. 2. Multi chamber cardiomegaly. Minimal heterogeneous attenuation of the lower lobes is no septal thickening, can be seen with small airways disease versus mild pulmonary edema. 3. Atherosclerosis of thoracic aorta. Coronary artery calcifications. Electronically Signed   By: Jeb Levering M.D.   On: 10/29/2015 21:02   Nm Myocar Multi W/spect W/wall Motion / Ef  10/30/2015   There was no ST segment deviation noted during stress.  Findings consistent with prior myocardial infarction with peri-infarct  ischemia in the inferolateral wall, this is consistent with her known LCX infarct and similar finding to her prior nuclear stress 06/07/2014  This is a high risk study. High risk due to low ejection fraction. There is large infarct with fairly mild amount of myocardium currently at jeopardy.  The left ventricular ejection fraction is severely decreased (<30%).        Disposition and Follow-up: Discharge Instructions    Diet Carb Modified    Complete by:  As directed      Increase activity slowly    Complete by:  As directed             DISPOSITION: Home   DISCHARGE FOLLOW-UP Follow-up Information    Follow up with Ermalinda Barrios, PA-C On 11/15/2015.   Specialty:  Cardiology   Why:  at 11:20 am in the  [place] office   Contact information:   [place] [address] [phone]       Follow up with Carlyle Dolly, MD. Schedule an appointment as soon as possible for a visit in 2 weeks.   Specialty:  Cardiology   Why:  for hospital follow-up   Contact information:   [address] [place] [address] [phone]       Follow up with Walker Kehr, MD. Schedule an appointment as soon as possible for a visit in 2 weeks.   Specialty:  Internal Medicine   Why:  for hospital follow-up   Contact information:   [place] [address] [phone]        Time spent on Discharge: 52mins   Signed:   Mashell Sieben M.D. [facility] 10/31/2015, 10:00 AM Pager: [phone]

## 2015-10-31 NOTE — Consult Note (Signed)
   [facility] CM Inpatient Consult   10/31/2015  Mary Cook [facility] Aug 04, 1926 [id number]  Late entry  Met with the patient and spouse at bedside to offer [place] Management services as benefit of [facility] Gold/[employer] insurance. Patient stating she does not want to participate at this time, but took a brochure for future reference with [facility] and RNCM contact information. Of note, [facility] Care Management services does not replace or interfere with any services that are arranged by inpatient case management or social work.  For additional questions or referrals please contact: Royetta Crochet. Laymond Purser, RN, BSN, [place] [facility] Liaison [phone]

## 2015-10-31 NOTE — Care Management Note (Signed)
Case Management Note  Patient Details  Name: Mary Cook MRN: [id number] Date of Birth: Mar 29, 1926  Expected Discharge Date:     10/31/2015             Expected Discharge Plan:  Home/Self Care  In-House Referral:     Discharge planning Services  CM Consult  Post Acute Care Choice:  NA Choice offered to:  NA  DME Arranged:    DME Agency:     HH Arranged:    [place] Agency:     Status of Service:  Completed, signed off  Medicare Important Message Given:    Date Medicare IM Given:    Medicare IM give by:    Date Additional Medicare IM Given:    Additional Medicare Important Message give by:     If discussed at [place] of Stay Meetings, dates discussed:    Additional Comments: Pt discharging home today. PT has recommended OP PT. Pt is agreeable. Referral faxed to [facility] OP rehab. Pt has no DME needs at DC.  Sherald Barge, RN 10/31/2015, 10:50 AM

## 2015-11-02 ENCOUNTER — Telehealth: Payer: Self-pay | Admitting: *Deleted

## 2015-11-02 NOTE — Telephone Encounter (Signed)
Called pt to get her set-up for TCM appt no answer LMOM RTC.../lmb 

## 2015-11-03 ENCOUNTER — Other Ambulatory Visit: Payer: Self-pay | Admitting: Cardiovascular Disease

## 2015-11-03 NOTE — Telephone Encounter (Signed)
entresto refilled 09/05/2015

## 2015-11-03 NOTE — Telephone Encounter (Signed)
Per schedulers pt called back and stated they did received msg from yesterday, and since they already have appt on 12/06/15 w/Dr. Alain Marion does not want to see anyone else will keep 4/12n appt...Mary Cook

## 2015-11-06 ENCOUNTER — Ambulatory Visit: Payer: Commercial Managed Care - HMO | Admitting: Cardiovascular Disease

## 2015-11-14 ENCOUNTER — Observation Stay (HOSPITAL_COMMUNITY)
Admission: EM | Admit: 2015-11-14 | Discharge: 2015-11-17 | Disposition: A | Discharge status: Home / Self Care | Payer: Commercial Managed Care - HMO | Attending: Internal Medicine | Admitting: Internal Medicine

## 2015-11-14 ENCOUNTER — Encounter (HOSPITAL_COMMUNITY): Payer: Self-pay | Admitting: *Deleted

## 2015-11-14 ENCOUNTER — Ambulatory Visit (INDEPENDENT_AMBULATORY_CARE_PROVIDER_SITE_OTHER): Payer: Commercial Managed Care - HMO | Admitting: Cardiovascular Disease

## 2015-11-14 ENCOUNTER — Emergency Department (HOSPITAL_COMMUNITY): Payer: Commercial Managed Care - HMO

## 2015-11-14 ENCOUNTER — Encounter: Payer: Self-pay | Admitting: Cardiovascular Disease

## 2015-11-14 VITALS — BP 140/100 | HR 76 | Ht 65.0 in | Wt 177.0 lb

## 2015-11-14 DIAGNOSIS — M545 Low back pain, unspecified: Secondary | ICD-10-CM | POA: Diagnosis present

## 2015-11-14 DIAGNOSIS — I255 Ischemic cardiomyopathy: Secondary | ICD-10-CM | POA: Diagnosis not present

## 2015-11-14 DIAGNOSIS — N189 Chronic kidney disease, unspecified: Secondary | ICD-10-CM | POA: Diagnosis present

## 2015-11-14 DIAGNOSIS — N183 Chronic kidney disease, stage 3 unspecified: Secondary | ICD-10-CM

## 2015-11-14 DIAGNOSIS — Z955 Presence of coronary angioplasty implant and graft: Secondary | ICD-10-CM | POA: Insufficient documentation

## 2015-11-14 DIAGNOSIS — J9601 Acute respiratory failure with hypoxia: Secondary | ICD-10-CM | POA: Diagnosis not present

## 2015-11-14 DIAGNOSIS — R739 Hyperglycemia, unspecified: Secondary | ICD-10-CM | POA: Diagnosis not present

## 2015-11-14 DIAGNOSIS — I509 Heart failure, unspecified: Secondary | ICD-10-CM

## 2015-11-14 DIAGNOSIS — I10 Essential (primary) hypertension: Secondary | ICD-10-CM | POA: Diagnosis present

## 2015-11-14 DIAGNOSIS — G4730 Sleep apnea, unspecified: Secondary | ICD-10-CM | POA: Diagnosis present

## 2015-11-14 DIAGNOSIS — R0682 Tachypnea, not elsewhere classified: Secondary | ICD-10-CM | POA: Diagnosis not present

## 2015-11-14 DIAGNOSIS — Z7984 Long term (current) use of oral hypoglycemic drugs: Secondary | ICD-10-CM | POA: Diagnosis not present

## 2015-11-14 DIAGNOSIS — R748 Abnormal levels of other serum enzymes: Secondary | ICD-10-CM | POA: Insufficient documentation

## 2015-11-14 DIAGNOSIS — Z8673 Personal history of transient ischemic attack (TIA), and cerebral infarction without residual deficits: Secondary | ICD-10-CM | POA: Diagnosis not present

## 2015-11-14 DIAGNOSIS — I252 Old myocardial infarction: Secondary | ICD-10-CM | POA: Diagnosis not present

## 2015-11-14 DIAGNOSIS — I2510 Atherosclerotic heart disease of native coronary artery without angina pectoris: Secondary | ICD-10-CM | POA: Insufficient documentation

## 2015-11-14 DIAGNOSIS — Z79899 Other long term (current) drug therapy: Secondary | ICD-10-CM | POA: Insufficient documentation

## 2015-11-14 DIAGNOSIS — Z9861 Coronary angioplasty status: Secondary | ICD-10-CM

## 2015-11-14 DIAGNOSIS — Z7982 Long term (current) use of aspirin: Secondary | ICD-10-CM | POA: Insufficient documentation

## 2015-11-14 DIAGNOSIS — R0600 Dyspnea, unspecified: Secondary | ICD-10-CM | POA: Diagnosis present

## 2015-11-14 DIAGNOSIS — R079 Chest pain, unspecified: Secondary | ICD-10-CM | POA: Diagnosis not present

## 2015-11-14 DIAGNOSIS — R0789 Other chest pain: Secondary | ICD-10-CM | POA: Diagnosis not present

## 2015-11-14 DIAGNOSIS — E119 Type 2 diabetes mellitus without complications: Secondary | ICD-10-CM

## 2015-11-14 DIAGNOSIS — E1122 Type 2 diabetes mellitus with diabetic chronic kidney disease: Principal | ICD-10-CM | POA: Insufficient documentation

## 2015-11-14 DIAGNOSIS — I5042 Chronic combined systolic (congestive) and diastolic (congestive) heart failure: Secondary | ICD-10-CM | POA: Diagnosis not present

## 2015-11-14 DIAGNOSIS — E785 Hyperlipidemia, unspecified: Secondary | ICD-10-CM | POA: Diagnosis not present

## 2015-11-14 DIAGNOSIS — I5043 Acute on chronic combined systolic (congestive) and diastolic (congestive) heart failure: Secondary | ICD-10-CM | POA: Insufficient documentation

## 2015-11-14 DIAGNOSIS — I130 Hypertensive heart and chronic kidney disease with heart failure and stage 1 through stage 4 chronic kidney disease, or unspecified chronic kidney disease: Secondary | ICD-10-CM | POA: Insufficient documentation

## 2015-11-14 DIAGNOSIS — F0280 Dementia in other diseases classified elsewhere without behavioral disturbance: Secondary | ICD-10-CM | POA: Diagnosis present

## 2015-11-14 DIAGNOSIS — G4733 Obstructive sleep apnea (adult) (pediatric): Secondary | ICD-10-CM | POA: Insufficient documentation

## 2015-11-14 DIAGNOSIS — R0902 Hypoxemia: Secondary | ICD-10-CM | POA: Diagnosis not present

## 2015-11-14 DIAGNOSIS — I671 Cerebral aneurysm, nonruptured: Secondary | ICD-10-CM | POA: Diagnosis not present

## 2015-11-14 DIAGNOSIS — G309 Alzheimer's disease, unspecified: Secondary | ICD-10-CM | POA: Insufficient documentation

## 2015-11-14 DIAGNOSIS — R0602 Shortness of breath: Secondary | ICD-10-CM | POA: Diagnosis not present

## 2015-11-14 DIAGNOSIS — K219 Gastro-esophageal reflux disease without esophagitis: Secondary | ICD-10-CM | POA: Insufficient documentation

## 2015-11-14 DIAGNOSIS — F329 Major depressive disorder, single episode, unspecified: Secondary | ICD-10-CM | POA: Insufficient documentation

## 2015-11-14 HISTORY — DX: Chronic combined systolic (congestive) and diastolic (congestive) heart failure: I50.42

## 2015-11-14 LAB — CBC WITH DIFFERENTIAL/PLATELET
BASOS ABS: 0 10*3/uL (ref 0.0–0.1)
BASOS PCT: 0 %
Eosinophils Absolute: 0.1 10*3/uL (ref 0.0–0.7)
Eosinophils Relative: 1 %
HEMATOCRIT: 37.1 % (ref 36.0–46.0)
HEMOGLOBIN: 12.2 g/dL (ref 12.0–15.0)
Lymphocytes Relative: 15 %
Lymphs Abs: 1.7 10*3/uL (ref 0.7–4.0)
MCH: 30 pg (ref 26.0–34.0)
MCHC: 32.9 g/dL (ref 30.0–36.0)
MCV: 91.4 fL (ref 78.0–100.0)
Monocytes Absolute: 0.8 10*3/uL (ref 0.1–1.0)
Monocytes Relative: 7 %
NEUTROS PCT: 77 %
Neutro Abs: 8.6 10*3/uL — ABNORMAL HIGH (ref 1.7–7.7)
Platelets: 186 10*3/uL (ref 150–400)
RBC: 4.06 MIL/uL (ref 3.87–5.11)
RDW: 12.8 % (ref 11.5–15.5)
WBC: 11.2 10*3/uL — AB (ref 4.0–10.5)

## 2015-11-14 LAB — I-STAT TROPONIN, ED: Troponin i, poc: 0.03 ng/mL (ref 0.00–0.08)

## 2015-11-14 MED ORDER — CARVEDILOL 12.5 MG PO TABS: 12.5000 mg | ORAL_TABLET | Freq: Two times a day (BID) | ORAL | Status: DC

## 2015-11-14 NOTE — ED Notes (Signed)
Pt to ED by [facility] c/o chest pressure and shortness of breath. Initially, wheezing noted bilateral bp 160/110, received one breathing treatment, Rales noted after treatment with drop in oxygenation sats to 90% on 15L NRB and increase of bp to 202/150. Pt was placed on CPAP by EMS with improvement of breathing. Pt received 324mg  ASA and nitro x 1. Pt denies chest pain. CPAP removed on arrival, nasal cannula placed at 2L

## 2015-11-14 NOTE — ED Provider Notes (Signed)
CSN: [id number]     Arrival date & time 11/14/15  2151 History   First MD Initiated Contact with Patient 11/14/15 2158     Chief Complaint  Patient presents with  . Shortness of Breath     Patient is a 82 y.o. female presenting with shortness of breath. The history is provided by the patient and the EMS personnel. No language interpreter was used.  Shortness of Breath  Mary Cook is a 82 y.o. female who presents to the Emergency Department complaining of SOB.  She reports subjective breath that started today. Symptoms are waxing and waning. She did have some chest pressure earlier today that is currently resolved. She denies any fevers, cough, lower extremely swelling or pain. She called 911 for her symptoms and they noted wheezing when they first arrived. They gave her a breathing treatment that resulted in some hypoxia and she was placed on a nonrebreather and then started on CPAP. She also received aspirin and nitroglycerin.She had a routine cardiology appointment today.  Past Medical History  Diagnosis Date  . Essential hypertension   . Osteoarthritis   . Osteopenia   . Diverticulosis of colon   . Type II diabetes mellitus ([place])   . CAD (coronary artery disease)     a. 04/2010 MI DES x 2 to LCX;  b. 05/2014 Cath: EF 35%, patent LCX stents w/ 70% stenosis in small AV groove LCX, Diag 50-60, RCA 50-60p->Med Rx.  Marland Kitchen Hyperlipidemia   . GIB (gastrointestinal bleeding)     a. 2002 Diverticular bleed.  . Skin disorder   . Visual disorder   . Thrombus   . Sleep apnea     cpap therapy  . Frequent urination   . Cerebral aneurysm   . Stroke ([place]) 2002  . Chronic systolic CHF (congestive heart failure) ([place])     a. 01/2015 Echo: EF 30-35%, sev inflat HK, Gr 1DD, mild AI.  . Ischemic cardiomyopathy     a. 01/2015 Echo: EF 30-35%.  . T12 compression fracture ([place])     a. 04/2015.   Past Surgical History  Procedure Laterality Date  . Abdominal hysterectomy  1980    no bso  .  Coronary angioplasty with stent placement  05/11/2010    stent CFX  . Cardiac catheterization  07/2011    LAD OK, D1 80%, CFX 30% w/ patent stent, RCA 50-60%, EF 40%  . Thrombectomy    . Cardiac catheterization    . Doppler echocardiography    . Myocardial perfusion study    . Left heart catheterization with coronary angiogram N/A 08/18/2011    Procedure: LEFT HEART CATHETERIZATION WITH CORONARY ANGIOGRAM;  Surgeon: Wellington Hampshire, MD;  Location: [facility] CATH LAB;  Service: Cardiovascular;  Laterality: N/A;  . Left heart catheterization with coronary angiogram N/A 06/08/2014    Procedure: LEFT HEART CATHETERIZATION WITH CORONARY ANGIOGRAM;  Surgeon: Troy Sine, MD;  Location: [facility] CATH LAB;  Service: Cardiovascular;  Laterality: N/A;  . Tonsillectomy    . Appendectomy     Family History  Problem Relation Age of Onset  . Hypertension Other   . CAD      father   Social History  Substance Use Topics  . Smoking status: Never Smoker   . Smokeless tobacco: Never Used  . Alcohol Use: No   OB History    No data available     Review of Systems  Respiratory: Positive for shortness of breath.   All other systems  reviewed and are negative.     Allergies  Clopidogrel bisulfate; Lipitor; and Lisinopril  Home Medications   Prior to Admission medications   Medication Sig Start Date End Date Taking? Authorizing Provider  acetaminophen (TYLENOL) 325 MG tablet Take 2 tablets (650 mg total) by mouth every 4 (four) hours as needed for headache or mild pain. 02/05/15  Yes Erlene Quan, PA-C  aspirin 81 MG EC tablet Take 81 mg by mouth daily.     Yes Historical Provider, MD  citalopram (CELEXA) 20 MG tablet Take 1 tablet (20 mg total) by mouth daily. 08/30/15  Yes Aleksei Plotnikov V, MD  cyclobenzaprine (FLEXERIL) 5 MG tablet Take 1 tablet (5 mg total) by mouth 3 (three) times daily. 10/31/15  Yes Ripudeep Krystal Eaton, MD  donepezil (ARICEPT) 5 MG tablet Take 1 tablet (5 mg total) by mouth at bedtime.  08/30/15  Yes Aleksei Plotnikov V, MD  furosemide (LASIX) 40 MG tablet Take 1 tablet (40 mg total) by mouth 2 (two) times daily. 08/30/15  Yes Aleksei Plotnikov V, MD  metFORMIN (GLUCOPHAGE) 500 MG tablet TAKE 1 TABLET TWICE DAILY BEFORE A MEAL 08/30/15  Yes Aleksei Plotnikov V, MD  omeprazole (PRILOSEC) 20 MG capsule Take 20 mg by mouth daily.   Yes Historical Provider, MD  pantoprazole (PROTONIX) 40 MG tablet Take 40 mg by mouth daily.  09/30/15  Yes Historical Provider, MD  potassium chloride SA (K-DUR,KLOR-CON) 20 MEQ tablet Take 1 tablet (20 mEq total) by mouth daily. 08/30/15  Yes Aleksei Plotnikov V, MD  sacubitril-valsartan (ENTRESTO) 49-51 MG Take 1 tablet by mouth 2 (two) times daily. 09/05/15  Yes Troy Sine, MD  UNABLE TO FIND Outpatient PHYSICAL THERAPY  Diagnosis: TIA (transient ischemic attack) 10/31/15  Yes Ripudeep Krystal Eaton, MD  carvedilol (COREG) 12.5 MG tablet Take 1 tablet (12.5 mg total) by mouth 2 (two) times daily. 11/14/15   Troy Sine, MD   BP 153/99 mmHg  Pulse 94  Temp(Src) 98.2 F (36.8 C) (Oral)  Resp 21  SpO2 98% Physical Exam  Constitutional: She is oriented to person, place, and time. She appears well-developed and well-nourished.  HENT:  Head: Normocephalic and atraumatic.  Cardiovascular: Normal rate and regular rhythm.   No murmur heard. Pulmonary/Chest: Effort normal. No respiratory distress.  Decreased air movement bilateral bases, mild tachypnea  Abdominal: Soft. There is no tenderness. There is no rebound and no guarding.  Musculoskeletal: She exhibits no edema or tenderness.  Neurological: She is alert and oriented to person, place, and time.  Skin: Skin is warm and dry.  Psychiatric: She has a normal mood and affect. Her behavior is normal.  Nursing note and vitals reviewed.   ED Course  Procedures  Labs Review Labs Reviewed  COMPREHENSIVE METABOLIC PANEL - Abnormal; Notable for the following:    Potassium 3.4 (*)    Glucose, Bld 240 (*)     Creatinine, Ser 1.39 (*)    Albumin 3.3 (*)    GFR calc non Af Amer 33 (*)    GFR calc Af Amer 38 (*)    All other components within normal limits  CBC WITH DIFFERENTIAL/PLATELET - Abnormal; Notable for the following:    WBC 11.2 (*)    Neutro Abs 8.6 (*)    All other components within normal limits  BRAIN NATRIURETIC PEPTIDE  I-STAT TROPOININ, ED    Imaging Review Dg Chest Port 1 View  11/14/2015  CLINICAL DATA:  82 year old female with shortness of breath  EXAM: PORTABLE CHEST 1 VIEW COMPARISON:  Chest CT dated 10/29/2015 FINDINGS: There is mild cardiomegaly with mild increased interstitial prominence which may represent a degree of congestive changes or edema. A small left pleural effusion is noted. There is no pneumothorax. The lungs are otherwise clear. No focal consolidation. There is degenerative changes of the spine and shoulders. No acute osseous pathology. IMPRESSION: Mild cardiomegaly with possible mild congestive changes and a small left pleural effusion. Electronically Signed   By: Anner Crete M.D.   On: 11/14/2015 22:27   I have personally reviewed and evaluated these images and lab results as part of my medical decision-making.   EKG Interpretation   Date/Time:  Tuesday November 14 2015 21:59:47 EDT Ventricular Rate:  103 PR Interval:  184 QRS Duration: 124 QT Interval:  380 QTC Calculation: 497 R Axis:   -41 Text Interpretation:  Sinus tachycardia Left bundle branch block Confirmed  by Hazle Coca [phone]) on 11/14/2015 10:44:40 PM      MDM   Final diagnoses:  Chest pain, unspecified chest pain type    Patient here for evaluation of shortness of breath that began today as well some left-sided chest pain. Her chest pain has resolved on evaluation in the emergency department. She received albuterol, aspirin, nitroglycerin, CPAP by EMS. The patient is feeling significantly improved on ED arrival. Her chest pain is resolved.  Chest x-ray is concerning for some  element of CHF, providing Lasix. Hospitalist consult for observation.  Quintella Reichert, MD 11/15/15 [phone]

## 2015-11-14 NOTE — Patient Instructions (Signed)
Your physician has recommended you make the following change in your medication:   1.) take 1 & 1/2 tablets of the carvedilol 6.25 mg tablets twice a day for 1 week then start the new prescription written today for the 12.5 mg tablets as directed on the bottle.  Your physician recommends that you schedule a follow-up appointment and blood work in 6 weeks.  Have the blood work done about 1 week prior to appointment.

## 2015-11-15 ENCOUNTER — Encounter: Payer: Commercial Managed Care - HMO | Admitting: Physician Assistant

## 2015-11-15 ENCOUNTER — Encounter (HOSPITAL_COMMUNITY): Payer: Self-pay | Admitting: Student

## 2015-11-15 DIAGNOSIS — K219 Gastro-esophageal reflux disease without esophagitis: Secondary | ICD-10-CM

## 2015-11-15 DIAGNOSIS — E785 Hyperlipidemia, unspecified: Secondary | ICD-10-CM

## 2015-11-15 DIAGNOSIS — J9601 Acute respiratory failure with hypoxia: Secondary | ICD-10-CM | POA: Diagnosis not present

## 2015-11-15 DIAGNOSIS — R0600 Dyspnea, unspecified: Secondary | ICD-10-CM | POA: Diagnosis present

## 2015-11-15 DIAGNOSIS — I5043 Acute on chronic combined systolic (congestive) and diastolic (congestive) heart failure: Secondary | ICD-10-CM

## 2015-11-15 DIAGNOSIS — R079 Chest pain, unspecified: Secondary | ICD-10-CM | POA: Diagnosis not present

## 2015-11-15 DIAGNOSIS — I509 Heart failure, unspecified: Secondary | ICD-10-CM

## 2015-11-15 DIAGNOSIS — I10 Essential (primary) hypertension: Secondary | ICD-10-CM

## 2015-11-15 LAB — COMPREHENSIVE METABOLIC PANEL
ALBUMIN: 3.3 g/dL — AB (ref 3.5–5.0)
ALT: 21 U/L (ref 14–54)
ANION GAP: 9 (ref 5–15)
AST: 27 U/L (ref 15–41)
Alkaline Phosphatase: 82 U/L (ref 38–126)
BUN: 15 mg/dL (ref 6–20)
CHLORIDE: 102 mmol/L (ref 101–111)
CO2: 25 mmol/L (ref 22–32)
Calcium: 9.2 mg/dL (ref 8.9–10.3)
Creatinine, Ser: 1.39 mg/dL — ABNORMAL HIGH (ref 0.44–1.00)
GFR calc non Af Amer: 33 mL/min — ABNORMAL LOW (ref >60)
GFR, EST AFRICAN AMERICAN: 38 mL/min — AB (ref >60)
Glucose, Bld: 240 mg/dL — ABNORMAL HIGH (ref 65–99)
Potassium: 3.4 mmol/L — ABNORMAL LOW (ref 3.5–5.1)
SODIUM: 136 mmol/L (ref 135–145)
Total Bilirubin: 0.5 mg/dL (ref 0.3–1.2)
Total Protein: 7.1 g/dL (ref 6.5–8.1)

## 2015-11-15 LAB — CBG MONITORING, ED
Glucose-Capillary: 198 mg/dL — ABNORMAL HIGH (ref 65–99)
Glucose-Capillary: 189 mg/dL — ABNORMAL HIGH (ref 65–99)

## 2015-11-15 LAB — APTT: aPTT: 27 seconds (ref 24–37)

## 2015-11-15 LAB — TROPONIN I
TROPONIN I: 0.11 ng/mL — AB (ref <0.031)
Troponin I: 0.06 ng/mL — ABNORMAL HIGH (ref <0.031)
Troponin I: 0.12 ng/mL — ABNORMAL HIGH (ref <0.031)

## 2015-11-15 LAB — GLUCOSE, CAPILLARY
Glucose-Capillary: 262 mg/dL — ABNORMAL HIGH (ref 65–99)
Glucose-Capillary: 137 mg/dL — ABNORMAL HIGH (ref 65–99)

## 2015-11-15 LAB — BRAIN NATRIURETIC PEPTIDE: B Natriuretic Peptide: 515.6 pg/mL — ABNORMAL HIGH (ref 0.0–100.0)

## 2015-11-15 LAB — PROTIME-INR
INR: 1.01 (ref 0.00–1.49)
PROTHROMBIN TIME: 13.5 s (ref 11.6–15.2)

## 2015-11-15 LAB — MAGNESIUM: MAGNESIUM: 1.8 mg/dL (ref 1.7–2.4)

## 2015-11-15 MED ORDER — SODIUM CHLORIDE 0.9% FLUSH: 3.0000 mL | INTRAVENOUS | Status: DC | PRN

## 2015-11-15 MED ORDER — CITALOPRAM HYDROBROMIDE 20 MG PO TABS
20.0000 mg | ORAL_TABLET | Freq: Every day | ORAL | Status: DC
  Administered 2015-11-15 – 2015-11-17 (×3): 20 mg via ORAL
  Filled 2015-11-15: qty 1
  Filled 2015-11-15: qty 2
  Filled 2015-11-15: qty 1

## 2015-11-15 MED ORDER — LEVALBUTEROL HCL 1.25 MG/0.5ML IN NEBU
1.2500 mg | INHALATION_SOLUTION | Freq: Four times a day (QID) | RESPIRATORY_TRACT | Status: DC
  Administered 2015-11-15 (×4): 1.25 mg via RESPIRATORY_TRACT
  Filled 2015-11-15 (×5): qty 0.5

## 2015-11-15 MED ORDER — ASPIRIN EC 81 MG PO TBEC
81.0000 mg | DELAYED_RELEASE_TABLET | Freq: Every day | ORAL | Status: DC
  Administered 2015-11-15 – 2015-11-17 (×3): 81 mg via ORAL
  Filled 2015-11-15 (×3): qty 1

## 2015-11-15 MED ORDER — ACETAMINOPHEN 325 MG PO TABS: 650.0000 mg | ORAL_TABLET | ORAL | Status: DC | PRN

## 2015-11-15 MED ORDER — PANTOPRAZOLE SODIUM 40 MG PO TBEC
40.0000 mg | DELAYED_RELEASE_TABLET | Freq: Every day | ORAL | Status: DC
Start: 2015-11-15 — End: 2015-11-17
  Administered 2015-11-15 – 2015-11-17 (×3): 40 mg via ORAL
  Filled 2015-11-15 (×3): qty 1

## 2015-11-15 MED ORDER — SODIUM CHLORIDE 0.9 % IV SOLN: 250.0000 mL | INTRAVENOUS | Status: DC | PRN

## 2015-11-15 MED ORDER — FUROSEMIDE 10 MG/ML IJ SOLN
40.0000 mg | Freq: Once | INTRAMUSCULAR | Status: AC
  Administered 2015-11-15: 40 mg via INTRAVENOUS
  Filled 2015-11-15: qty 4

## 2015-11-15 MED ORDER — MORPHINE SULFATE (PF) 2 MG/ML IV SOLN: 2.0000 mg | INTRAVENOUS | Status: DC | PRN

## 2015-11-15 MED ORDER — INSULIN ASPART 100 UNIT/ML ~~LOC~~ SOLN
0.0000 [IU] | Freq: Three times a day (TID) | SUBCUTANEOUS | Status: DC
  Administered 2015-11-15: 5 [IU] via SUBCUTANEOUS
  Administered 2015-11-15 – 2015-11-17 (×7): 2 [IU] via SUBCUTANEOUS
  Filled 2015-11-15 (×2): qty 1

## 2015-11-15 MED ORDER — SODIUM CHLORIDE 0.9% FLUSH
3.0000 mL | Freq: Two times a day (BID) | INTRAVENOUS | Status: DC
  Administered 2015-11-15 – 2015-11-17 (×6): 3 mL via INTRAVENOUS

## 2015-11-15 MED ORDER — CARVEDILOL 6.25 MG PO TABS
6.2500 mg | ORAL_TABLET | Freq: Two times a day (BID) | ORAL | Status: DC
  Administered 2015-11-15 – 2015-11-17 (×5): 6.25 mg via ORAL
  Filled 2015-11-15 (×5): qty 1

## 2015-11-15 MED ORDER — ONDANSETRON HCL 4 MG/2ML IJ SOLN: 4.0000 mg | Freq: Four times a day (QID) | INTRAMUSCULAR | Status: DC | PRN

## 2015-11-15 MED ORDER — POTASSIUM CHLORIDE 20 MEQ/15ML (10%) PO SOLN
20.0000 meq | Freq: Once | ORAL | Status: AC
  Administered 2015-11-15: 20 meq via ORAL
  Filled 2015-11-15: qty 15

## 2015-11-15 MED ORDER — ZOLPIDEM TARTRATE 5 MG PO TABS
5.0000 mg | ORAL_TABLET | Freq: Every evening | ORAL | Status: DC | PRN
  Administered 2015-11-15 – 2015-11-16 (×2): 5 mg via ORAL
  Filled 2015-11-15 (×3): qty 1

## 2015-11-15 MED ORDER — NITROGLYCERIN 0.4 MG SL SUBL: 0.4000 mg | SUBLINGUAL_TABLET | SUBLINGUAL | Status: DC | PRN

## 2015-11-15 MED ORDER — CYCLOBENZAPRINE HCL 10 MG PO TABS
5.0000 mg | ORAL_TABLET | Freq: Three times a day (TID) | ORAL | Status: DC
  Administered 2015-11-15 – 2015-11-16 (×4): 5 mg via ORAL
  Filled 2015-11-15 (×4): qty 1

## 2015-11-15 MED ORDER — ENOXAPARIN SODIUM 30 MG/0.3ML ~~LOC~~ SOLN
30.0000 mg | Freq: Every day | SUBCUTANEOUS | Status: DC
  Administered 2015-11-15 – 2015-11-17 (×3): 30 mg via SUBCUTANEOUS
  Filled 2015-11-15 (×4): qty 0.3

## 2015-11-15 MED ORDER — INSULIN ASPART 100 UNIT/ML ~~LOC~~ SOLN: 0.0000 [IU] | Freq: Every day | SUBCUTANEOUS | Status: DC

## 2015-11-15 MED ORDER — DONEPEZIL HCL 5 MG PO TABS
5.0000 mg | ORAL_TABLET | Freq: Every day | ORAL | Status: DC
  Administered 2015-11-15 – 2015-11-16 (×2): 5 mg via ORAL
  Filled 2015-11-15 (×2): qty 1

## 2015-11-15 MED ORDER — SACUBITRIL-VALSARTAN 49-51 MG PO TABS
1.0000 | ORAL_TABLET | Freq: Two times a day (BID) | ORAL | Status: DC
  Administered 2015-11-15 – 2015-11-17 (×5): 1 via ORAL
  Filled 2015-11-15 (×9): qty 1

## 2015-11-15 MED ORDER — FUROSEMIDE 10 MG/ML IJ SOLN
40.0000 mg | Freq: Once | INTRAMUSCULAR | Status: AC
  Administered 2015-11-16: 40 mg via INTRAVENOUS
  Filled 2015-11-15: qty 4

## 2015-11-15 NOTE — Progress Notes (Signed)
Patient lying in bed, no pain, distress or needs at this time. Husband present at bedside. Call light within reach

## 2015-11-15 NOTE — H&P (Signed)
[facility] History and Physical  Mary Cook [id number] DOB: 09/08/1925 DOA: 11/14/2015  Referring physician: ED physician PCP: Mary Kehr, MD  Specialists:   Chief Complaint: Shortness of breath, mild chest pain  HPI: Mary Cook is a 82 y.o. female with PMH of combat his systolic and diastolic congestive heart failure with EF of 30-35 percent with grade 1 diastolic dysfunction, hypertension, hyperlipidemia, diabetes mellitus, GERD, depression, CAD, s/p of stent placement 2011, GI bleeding, OSA not on CPAP, stroke, cerebral aneurysm, chronic kidney disease-stage III, who presents with shortness breath and mild chest pain.  Patient reports that she has been having shortness of breath for the whole day, with mild chest pain. her chest pain has resolved. No tenderness over calf areas. Patient has mild dry cough, no fever or chills. Patient does not have nausea, vomiting, abdominal pain, diarrhea, symptoms of UTI or unilateral weakness.  Of note, patient was recently hospitalized from 3/5-3/7 because of atypical chest pain. Cardiology was consulted in that admission. Patient had nuclear Myoview test, which showed results consistent with prior myocardial infarction with peri-infarct ischemia in the inferolateral wall; this is consistent with her known LCX infarct and similar finding to her prior nuclear stress 06/07/2014.   In ED, patient was found to have BNP 515.6, troponin 0.06, WBC 11.2, temperature normal, tachycardia, tachypnea, potassium 3.4, stable renal function. CXR showed mild cardiomegaly with possible mild congestive changes and a small left pleural effusion. Patient is admitted to inpatient for further evaluation and treatment.  EKG: Independently reviewed. QTC 497, LAD, T-wave inversion in lateral leads which is old  Where does patient live?   At home  Can patient participate in ADLs?  Barely Review of Systems:   General: no fevers, chills, no changes  in body weight, has poor appetite, has fatigue HEENT: no blurry vision, hearing changes or sore throat Pulm: has dyspnea, coughing, no wheezing CV: had chest pain, no palpitations Abd: no nausea, vomiting, abdominal pain, diarrhea, constipation GU: no dysuria, burning on urination, increased urinary frequency, hematuria  Ext: has leg edema Neuro: no unilateral weakness, numbness, or tingling, no vision change or hearing loss Skin: no rash MSK: No muscle spasm, no deformity, no limitation of range of movement in spin Heme: No easy bruising.  Travel history: No recent long distant travel.  Allergy:  Allergies  Allergen Reactions  . Clopidogrel Bisulfate Nausea And Vomiting    Patient is not aware of this allergy  . Lipitor [Atorvastatin] Other (See Comments)    Weak legs  . Lisinopril Cough    Patient is not aware of this allergy    Past Medical History  Diagnosis Date  . Essential hypertension   . Osteoarthritis   . Osteopenia   . Diverticulosis of colon   . Type II diabetes mellitus ([place])   . CAD (coronary artery disease)     a. 04/2010 MI DES x 2 to LCX;  b. 05/2014 Cath: EF 35%, patent LCX stents w/ 70% stenosis in small AV groove LCX, Diag 50-60, RCA 50-60p->Med Rx.  Marland Kitchen Hyperlipidemia   . GIB (gastrointestinal bleeding)     a. 2002 Diverticular bleed.  . Skin disorder   . Visual disorder   . Thrombus   . Sleep apnea     cpap therapy  . Frequent urination   . Cerebral aneurysm   . Stroke ([place]) 2002  . Chronic systolic CHF (congestive heart failure) ([place])     a. 01/2015 Echo: EF 30-35%, sev inflat HK,  Gr 1DD, mild AI.  . Ischemic cardiomyopathy     a. 01/2015 Echo: EF 30-35%.  . T12 compression fracture ([place])     a. 04/2015.    Past Surgical History  Procedure Laterality Date  . Abdominal hysterectomy  1980    no bso  . Coronary angioplasty with stent placement  05/11/2010    stent CFX  . Cardiac catheterization  07/2011    LAD OK, D1 80%, CFX 30% w/ patent  stent, RCA 50-60%, EF 40%  . Thrombectomy    . Cardiac catheterization    . Doppler echocardiography    . Myocardial perfusion study    . Left heart catheterization with coronary angiogram N/A 08/18/2011    Procedure: LEFT HEART CATHETERIZATION WITH CORONARY ANGIOGRAM;  Surgeon: Mary Hampshire, MD;  Location: [facility] CATH LAB;  Service: Cardiovascular;  Laterality: N/A;  . Left heart catheterization with coronary angiogram N/A 06/08/2014    Procedure: LEFT HEART CATHETERIZATION WITH CORONARY ANGIOGRAM;  Surgeon: Mary Sine, MD;  Location: [facility] CATH LAB;  Service: Cardiovascular;  Laterality: N/A;  . Tonsillectomy    . Appendectomy      Social History:  reports that she has never smoked. She has never used smokeless tobacco. She reports that she does not drink alcohol or use illicit drugs.  Family History:  Family History  Problem Relation Age of Onset  . Hypertension Other   . CAD      father     Prior to Admission medications   Medication Sig Start Date End Date Taking? Authorizing Provider  acetaminophen (TYLENOL) 325 MG tablet Take 2 tablets (650 mg total) by mouth every 4 (four) hours as needed for headache or mild pain. 02/05/15  Yes Mary Quan, PA-C  aspirin 81 MG EC tablet Take 81 mg by mouth daily.     Yes Historical Provider, MD  citalopram (CELEXA) 20 MG tablet Take 1 tablet (20 mg total) by mouth daily. 08/30/15  Yes Mary Plotnikov V, MD  cyclobenzaprine (FLEXERIL) 5 MG tablet Take 1 tablet (5 mg total) by mouth 3 (three) times daily. 10/31/15  Yes Mary Krystal Eaton, MD  donepezil (ARICEPT) 5 MG tablet Take 1 tablet (5 mg total) by mouth at bedtime. 08/30/15  Yes Mary Plotnikov V, MD  furosemide (LASIX) 40 MG tablet Take 1 tablet (40 mg total) by mouth 2 (two) times daily. 08/30/15  Yes Mary Plotnikov V, MD  metFORMIN (GLUCOPHAGE) 500 MG tablet TAKE 1 TABLET TWICE DAILY BEFORE A MEAL 08/30/15  Yes Mary Plotnikov V, MD  omeprazole (PRILOSEC) 20 MG capsule Take 20 mg by mouth  daily.   Yes Historical Provider, MD  pantoprazole (PROTONIX) 40 MG tablet Take 40 mg by mouth daily.  09/30/15  Yes Historical Provider, MD  potassium chloride SA (K-DUR,KLOR-CON) 20 MEQ tablet Take 1 tablet (20 mEq total) by mouth daily. 08/30/15  Yes Mary Plotnikov V, MD  sacubitril-valsartan (ENTRESTO) 49-51 MG Take 1 tablet by mouth 2 (two) times daily. 09/05/15  Yes Mary Sine, MD  UNABLE TO FIND Outpatient PHYSICAL THERAPY  Diagnosis: TIA (transient ischemic attack) 10/31/15  Yes Mary Krystal Eaton, MD  carvedilol (COREG) 12.5 MG tablet Take 1 tablet (12.5 mg total) by mouth 2 (two) times daily. 11/14/15   Mary Sine, MD    Physical Exam: Filed Vitals:   11/15/15 0132 11/15/15 0200 11/15/15 0305 11/15/15 0409  BP:  141/68    Pulse:  81 79   Temp:  TempSrc:      Resp:  21 22   Weight: 78.472 kg (173 lb)     SpO2:  96% 97% 96%   General: Not in acute distress HEENT:       Eyes: PERRL, EOMI, no scleral icterus.       ENT: No discharge from the ears and nose, no pharynx injection, no tonsillar enlargement.        Neck: No JVD, no bruit, no mass felt. Heme: No neck lymph node enlargement. Cardiac: S1/S2, RRR, No murmurs, No gallops or rubs. Pulm: No rales, wheezing, rhonchi or rubs. Abd: Soft, nondistended, nontender, no rebound pain, no organomegaly, BS present. Ext: 1+ pitting leg edema bilaterally. 2+DP/PT pulse bilaterally. Musculoskeletal: No joint deformities, No joint redness or warmth, no limitation of ROM in spin. Skin: No rashes.  Neuro: Alert, oriented X3, cranial nerves II-XII grossly intact, moves all extremities normally.   Psych: Patient is not psychotic, no suicidal or hemocidal ideation.  Labs on Admission:  Basic Metabolic Panel:  Recent Labs Lab 11/14/15 2300 11/15/15 0425  NA 136  --   K 3.4*  --   CL 102  --   CO2 25  --   GLUCOSE 240*  --   BUN 15  --   CREATININE 1.39*  --   CALCIUM 9.2  --   MG  --  1.8   Liver Function  Tests:  Recent Labs Lab 11/14/15 2300  AST 27  ALT 21  ALKPHOS 82  BILITOT 0.5  PROT 7.1  ALBUMIN 3.3*   No results for input(s): LIPASE, AMYLASE in the last 168 hours. No results for input(s): AMMONIA in the last 168 hours. CBC:  Recent Labs Lab 11/14/15 2300  WBC 11.2*  NEUTROABS 8.6*  HGB 12.2  HCT 37.1  MCV 91.4  PLT 186   Cardiac Enzymes:  Recent Labs Lab 11/15/15 0228  TROPONINI 0.06*    BNP (last 3 results)  Recent Labs  04/18/15 1200 05/24/15 1018 11/14/15 2300  BNP 529.8* 424.0* 515.6*    ProBNP (last 3 results) No results for input(s): PROBNP in the last 8760 hours.  CBG: No results for input(s): GLUCAP in the last 168 hours.  Radiological Exams on Admission: Dg Chest Port 1 View  11/14/2015  CLINICAL DATA:  82 year old female with shortness of breath EXAM: PORTABLE CHEST 1 VIEW COMPARISON:  Chest CT dated 10/29/2015 FINDINGS: There is mild cardiomegaly with mild increased interstitial prominence which may represent a degree of congestive changes or edema. A small left pleural effusion is noted. There is no pneumothorax. The lungs are otherwise clear. No focal consolidation. There is degenerative changes of the spine and shoulders. No acute osseous pathology. IMPRESSION: Mild cardiomegaly with possible mild congestive changes and a small left pleural effusion. Electronically Signed   By: Anner Crete M.D.   On: 11/14/2015 22:27    Assessment/Plan Principal Problem:   Acute on chronic combined systolic and diastolic heart failure, EF 30-35% Active Problems:   Diabetes mellitus ([place])   Essential hypertension   CAD- Ant MI, CFX DES 9/11, no ISR or progression at cath 2012   Sleep apnea, cpap had been stopped secondary to "sinus issues"   GERD (gastroesophageal reflux disease)   Chest pain   Alzheimer's disease   Chronic kidney disease stage III   LBP (low back pain)   Dyspnea   Acute respiratory failure with hypoxia ([facility])   CHF  exacerbation ([facility])   Acute on chronic combined systolic  and diastolic heart failure: Patient's shortness of breath, elevated BNP and leg edema are consistent with CHF exacerbation. 2-D echo on 01/31/15 showed EF 30-85 percent with grade 1 diastolic dysfunction.  -will admit to tele bed -Lasix 40 mg bid by IV -Continue Entresto -trop x 3 -2d echo -will continue home coreg, ASA -Daily weights -strict I/O's -Low salt diet  Chest pain and CAD: s/p of stent. In recent admission, patient had nuclear Myoview test, which showed results consistent with prior myocardial infarction. Pt was seen by cardiologist, Dr. Marcello Moores on 11/14/15, and make some adjustment of her Coreg-->"take 1 & 1/2 tablets of the carvedilol 6.25 mg tablets twice a day for 1 week then start the new prescription written today for the 12.5 mg tablets as directed on the bottle'. Now pt has chest pain, but no new EKG change. Trop is mildly elevated, which is likely due to demanding ischemia 2/2 CHF exacerbation. Currently patient is chest pain-free.  -f/u trop x 3, 2d echo -continue his aspirin, Plavix, Coreg -When necessary nitroglycerin and morphine  DM-II: Last A1c 8.3 on 10/29/15, poorly controled. Patient is taking metformin at home -SSI  GERD: -Protonix  Hypertension: -On IV Lasix and Entresto  Hx of stroke: -Continue aspirin  Alzheimer's disease: -Continue donepezil  Chronic kidney disease stage III: Baseline creatinine 1.1-1.5, her creatinine is 1.39, which is close to baseline. -Follow-up renal Fx by BMP  Depression: Stable, no suicidal or homicidal ideations. -Continue home medications: Celexa  LBP (low back pain): -Flexeril  DVT ppx:  SQ Lovenox  Code Status: Full code Family Communication:  Yes, patient's husband at bed side Disposition Plan: Admit to inpatient   Date of Service 11/15/2015    Ivor Costa [facility] Pager [phone]  If 7PM-7AM, please contact  night-coverage [email or web address] Password [facility] 11/15/2015, 6:06 AM

## 2015-11-15 NOTE — ED Notes (Addendum)
Pt c/o worsening shortness of breath throughout the day. Took an extra dose of lasix at home this morning with increased urinary output.

## 2015-11-15 NOTE — Progress Notes (Signed)
PT Cancellation Note  Patient Details Name: Mary Cook MRN: [id number] DOB: 1926-03-15   Cancelled Treatment:    Reason Eval/Treat Not Completed: Patient not medically ready Pt with elevated troponin. Will await clearance from Cardiology or downward trend of troponin prior to initiation of PT eval.   [name] A [name] 11/15/2015, 3:57 PM Wray Kearns, [name], DPT [phone]

## 2015-11-15 NOTE — ED Notes (Signed)
This RN has attempted to call report to 2W x2 and no one has answered the phone.

## 2015-11-15 NOTE — ED Notes (Signed)
Pt c/o worsening shortness of breath; lung sounds improved, sats remained 98%-100% on room air

## 2015-11-15 NOTE — ED Notes (Signed)
Pt CBG, 189. 

## 2015-11-15 NOTE — ED Notes (Signed)
Pt reports feeling a burning sensation from oxygen, spoke with RT, [place] remove for room air trial

## 2015-11-15 NOTE — Progress Notes (Signed)
Pt received from ED nurse brooke. Pt oriented to room and equipment. Pt educated on importance of calling staff when getting out of bed. Family at beside. Will continue to monitor.

## 2015-11-15 NOTE — ED Notes (Signed)
Ordered breakfast tray for patient

## 2015-11-15 NOTE — Progress Notes (Signed)
Patient seen and examined this morning, admitted overnight, H&P reviewed.   82 y.o. female with PMH of combat his systolic and diastolic congestive heart failure with EF of 30-35 percent with grade 1 diastolic dysfunction, hypertension, hyperlipidemia, diabetes mellitus, GERD, depression, CAD, s/p of stent placement 2011, GI bleeding, OSA not on CPAP, stroke, cerebral aneurysm, chronic kidney disease-stage III, who presents with shortness breath and chest pain.   Acute on chronic combined systolic and diastolic heart failure: Patient's shortness of breath, elevated BNP and leg edema are consistent with CHF exacerbation. 2-D echo on 01/31/15 showed EF 30 - 35 %ercent with grade 1 diastolic dysfunction. - IV Lasix x 2 overnight, monitor and repeat renal function in am  Chest pain and CAD - patient with history of recurrent chest pain, just hospitalized with same 2 weeks ago - saw Dr. Claiborne Billings yesterday, states that she was feeling "fine" then when she arrived home she was very dyspneic - continue aspirin, Plavix, Coreg - When necessary nitroglycerin and morphine  - cardiology consulted  DM-II - Last A1c 8.3 on 10/29/15, poorly controled. Patient is taking metformin at home - SSI  GERD - Protonix  Hypertension - On IV Lasix and Entresto  Hx of stroke - Continue aspirin  Alzheimer's disease -Continue donepezil  Chronic kidney disease stage III - baseline creatinine 1.1-1.5, her creatinine is 1.39, which is close to baseline. - Follow-up renal Fx by BMP  Depression - Continue home medications: Celexa  LBP (low back pain): - Flexeril    Arius Harnois M. Cruzita Lederer, MD [facility] [phone]

## 2015-11-15 NOTE — Consult Note (Signed)
Cardiology Consult    Patient ID: LAIKLYN CHESMORE MRN: [id number], DOB/AGE: 80-30-1927   Admit date: 11/14/2015 Date of Consult: 11/15/2015  Primary Physician: Walker Kehr, MD Reason for Consult: CHF Primary Cardiologist: Dr. Claiborne Billings  Requesting Provider: Dr. Blaine Hamper   History of Present Illness    Rasheida MINNETTE MUKES is a 82 y.o. female with past medical history of CAD (s/p DES - LCx in 04/2010, cath in 05/2014 w/ patent stent, 50-70% stenosis D1), chronic combined systolic and diastolic CHF, Type 2 DM, HTN, HLD, Stage 3 CKD, and OSA (not on CPAP) who presented to [facility] ED on 11/14/2015 for worsening shortness of breath and chest pressure.  She was recently hospitalized from 10/29/2015 - 10/31/2015 for chest pain. Cardiology was consulted at that time and recommended a Lexiscan Myoview. Her stress test showed findings consistent with a prior MI in the inferolateral wall and similar to her previous stress test in 05/2014. The study was interpreted as high-risk due to her severely decreased EF which was known. Her Coreg was increased 6.25mg  BID during her hospitalization.  The patient says she was actually seen and examined by Dr. Claiborne Billings yesterday morning (11/14/2015) and was doing well at that time. However, last night she developed a chest pressure and became short of breath. She describes the pain as a shooting pain along her left side. Her husband says she has had this pain ever since breaking a rib during a fall last year. There is no exertional component with the pain. Says it was resolved by the time she made it to [facility]. She describes the shortness of breath as new, saying it occurred at rest. Denies PND or orthopnea.  She initially had to be placed on a NRB but was transitioned to 2L nasal cannula in the ED. A CXR was obtained and showed mild cardiomegaly with possible mild congestive changes and a small left pleural effusion. BNP elevated to 515. WBC 11.2. Hgb 12.2 Platelets 186.  K+ 3.4. Creatinine 1.39.  She has been given IV Lasix 40mg  for two doses with a recorded net output of -2.6L. Her weight is down 4 pounds. She does not weight daily at home so we are unsure of her baseline weight.  She says her breathing has improved since admission and she denies any chest pain at this time. Her husband notes she is "more sleepy" than usual but this occurred after she received pain medications in the ED.    Past Medical History   Past Medical History  Diagnosis Date  . Essential hypertension   . Osteoarthritis   . Osteopenia   . Diverticulosis of colon   . Type II diabetes mellitus ([place])   . CAD (coronary artery disease)     a. 04/2010 MI DES x 2 to LCX;  b. 05/2014 Cath: EF 35%, patent LCX stents w/ 70% stenosis in small AV groove LCX, Diag 50-60, RCA 50-60p->Med Rx.  Marland Kitchen Hyperlipidemia   . GIB (gastrointestinal bleeding)     a. 2002 Diverticular bleed.  . Skin disorder   . Visual disorder   . Thrombus   . Sleep apnea     cpap therapy  . Frequent urination   . Cerebral aneurysm   . Stroke ([place]) 2002  . Chronic systolic CHF (congestive heart failure) ([place])     a. 01/2015 Echo: EF 30-35%, sev inflat HK, Gr 1DD, mild AI.  . Ischemic cardiomyopathy     a. 01/2015 Echo: EF 30-35%.  . T12  compression fracture ([place])     a. 04/2015.    Past Surgical History  Procedure Laterality Date  . Abdominal hysterectomy  1980    no bso  . Coronary angioplasty with stent placement  05/11/2010    stent CFX  . Cardiac catheterization  07/2011    LAD OK, D1 80%, CFX 30% w/ patent stent, RCA 50-60%, EF 40%  . Thrombectomy    . Cardiac catheterization    . Doppler echocardiography    . Myocardial perfusion study    . Left heart catheterization with coronary angiogram N/A 08/18/2011    Procedure: LEFT HEART CATHETERIZATION WITH CORONARY ANGIOGRAM;  Surgeon: Wellington Hampshire, MD;  Location: [facility] CATH LAB;  Service: Cardiovascular;  Laterality: N/A;  . Left heart catheterization with  coronary angiogram N/A 06/08/2014    Procedure: LEFT HEART CATHETERIZATION WITH CORONARY ANGIOGRAM;  Surgeon: Troy Sine, MD;  Location: [facility] CATH LAB;  Service: Cardiovascular;  Laterality: N/A;  . Tonsillectomy    . Appendectomy       Allergies  Allergies  Allergen Reactions  . Clopidogrel Bisulfate Nausea And Vomiting    Patient is not aware of this allergy  . Lipitor [Atorvastatin] Other (See Comments)    Weak legs  . Lisinopril Cough    Patient is not aware of this allergy    Inpatient Medications    . aspirin EC  81 mg Oral Daily  . carvedilol  6.25 mg Oral BID WC  . citalopram  20 mg Oral Daily  . cyclobenzaprine  5 mg Oral TID  . donepezil  5 mg Oral QHS  . enoxaparin (LOVENOX) injection  30 mg Subcutaneous Daily  . insulin aspart  0-5 Units Subcutaneous QHS  . insulin aspart  0-9 Units Subcutaneous TID WC  . levalbuterol  1.25 mg Nebulization Q6H  . pantoprazole  40 mg Oral Daily  . sacubitril-valsartan  1 tablet Oral BID  . sodium chloride flush  3 mL Intravenous Q12H    Family History    Family History  Problem Relation Age of Onset  . Hypertension Other   . CAD      father    Social History    Social History   Social History  . Marital Status: Married    Spouse Name: N/A  . Number of Children: N/A  . Years of Education: N/A   Occupational History  . Retired    Social History Main Topics  . Smoking status: Never Smoker   . Smokeless tobacco: Never Used  . Alcohol Use: No  . Drug Use: No  . Sexual Activity: Not on file   Other Topics Concern  . Not on file   Social History Narrative     Review of Systems    General:  No chills, fever, night sweats or weight changes.  Cardiovascular:  No dyspnea on exertion, edema, orthopnea, palpitations, paroxysmal nocturnal dyspnea. Positive for chest pain.  Dermatological: No rash, lesions/masses Respiratory: No cough, Positive for dyspnea Urologic: No hematuria, dysuria Abdominal:   No  nausea, vomiting, diarrhea, bright red blood per rectum, melena, or hematemesis Neurologic:  No visual changes, wkns, changes in mental status. All other systems reviewed and are otherwise negative except as noted above.  Physical Exam    Blood pressure 139/61, pulse 72, temperature 98.1 F (36.7 C), temperature source Oral, resp. rate 23, height 5\' 5"  (1.651 m), weight 169 lb 8.5 oz (76.9 kg), SpO2 98 %.  General: Pleasant, elderly Caucasian female appearing  in NAD Psych: Normal affect. Neuro: Alert and oriented X 3. Moves all extremities spontaneously. HEENT: Normal  Neck: Supple without bruits or JVD. Lungs:  Resp regular and unlabored, mild rales at the bases bilaterally, diminished bilateral bases but otherwise good air movement bilaterally.  Heart: RRR no s3, s4, or murmurs. Tender to palpation along chest wall. Abdomen: Soft, non-tender, non-distended, BS + x 4.  Extremities: No clubbing or cyanosis. Trace lower extremity edema. DP/PT/Radials 2+ and equal bilaterally.  Labs    Troponin [facility] of Care Test)  Recent Labs  11/14/15 2303  TROPIPOC 0.03    Recent Labs  11/15/15 0228 11/15/15 0845  TROPONINI 0.06* 0.11*   Lab Results  Component Value Date   WBC 11.2* 11/14/2015   HGB 12.2 11/14/2015   HCT 37.1 11/14/2015   MCV 91.4 11/14/2015   PLT 186 11/14/2015    Recent Labs Lab 11/14/15 2300  NA 136  K 3.4*  CL 102  CO2 25  BUN 15  CREATININE 1.39*  CALCIUM 9.2  PROT 7.1  BILITOT 0.5  ALKPHOS 82  ALT 21  AST 27  GLUCOSE 240*   Lab Results  Component Value Date   CHOL 165 09/05/2014   HDL 70 09/05/2014   LDLCALC 76 09/05/2014   TRIG 93 09/05/2014   Lab Results  Component Value Date   DDIMER 1.40* 08/17/2011     Radiology Studies     Nm Myocar Multi W/spect W/wall Motion / Ef: 10/30/2015   There was no ST segment deviation noted during stress.  Findings consistent with prior myocardial infarction with peri-infarct ischemia in the  inferolateral wall, this is consistent with her known LCX infarct and similar finding to her prior nuclear stress 06/07/2014  This is a high risk study. High risk due to low ejection fraction. There is large infarct with fairly mild amount of myocardium currently at jeopardy.  The left ventricular ejection fraction is severely decreased (<30%).    Dg Chest Port 1 View: 11/14/2015  CLINICAL DATA:  82 year old female with shortness of breath EXAM: PORTABLE CHEST 1 VIEW COMPARISON:  Chest CT dated 10/29/2015 FINDINGS: There is mild cardiomegaly with mild increased interstitial prominence which may represent a degree of congestive changes or edema. A small left pleural effusion is noted. There is no pneumothorax. The lungs are otherwise clear. No focal consolidation. There is degenerative changes of the spine and shoulders. No acute osseous pathology. IMPRESSION: Mild cardiomegaly with possible mild congestive changes and a small left pleural effusion. Electronically Signed   By: Anner Crete M.D.   On: 11/14/2015 22:27    EKG & Cardiac Imaging    EKG: Sinus tachycardia, HR 103, LAD, LBBB.  Echocardiogram: 01/2015 Study Conclusions - Left ventricle: The cavity size was normal. Wall thickness was  increased in a pattern of severe LVH. Systolic function was  moderately to severely reduced. The estimated ejection fraction  was in the range of 30% to 35%. There is severe hypokinesis of  the entireinferolateral myocardium. Doppler parameters are  consistent with abnormal left ventricular relaxation (grade 1  diastolic dysfunction). Doppler parameters are consistent with  high ventricular filling pressure. - Aortic valve: There was mild regurgitation. - Mitral valve: Calcified annulus. Mildly thickened leaflets .  Assessment & Plan    1. Acute on Chronic Combined Systolic and Diastolic CHF - presented with worsening shortness of breath starting yesterday afternoon. Had been seen by Dr.  Claiborne Billings earlier that morning and says she was "feeling well".  -  CXR at time of admission showed mild cardiomegaly with possible mild congestive changes and a small left pleural effusion. BNP elevated to 515.  - has been given IV Lasix 40mg  for two doses with a recorded net output of -2.6L. Her weight is down 4 pounds. She does not weight daily at home so we are unsure of her baseline weight. - the importance of daily weights and sodium restriction were reviewed with the patient.  - a repeat echocardiogram is pending to assess LV function (was 30-35% in 01/2015).  - has mild rales on examination. Would give additional IV Lasix dose tomorrow morning and then reassess. Can likely be switched back to PO dosing later tomorrow afternoon (PTA dose was Lasix 40mg  BID). - continue BB and Entresto.  2. Chest Pain/ Elevated Troponin/History of CAD - s/p DES to LCx in 04/2010, cath in 05/2014 w/ patent stent, 50-70% stenosis D1.  - presents with a stabbing pain in her left chest which occurred at rest. Her husband says she has expressed having this pain since her fall and rib fracture in [id number]. - cyclic troponin values have been 0.06 and 0.11, likely to represent demand ischemia in the setting of her acute CHF exacerbation.  - had recent stress test on 10/30/2015 which showed findings consistent with a prior MI in the inferolateral wall and similar to her previous stress test in 05/2014. No further ischemic evaluation was recommended at that time. - the pain she is describing now seems likely MSK or Neuropathic, for she is slightly tender to palpation, atypical for ACS. If she had recurrent anginal symptoms, could consider addition of low-dose Imdur. - continue ASA, BB, and Entresto.   3. HTN - BP has been 120/58 - 171/100 while admitted.  - continue Entresto and BB.   4. HLD - not currently on statin therapy, intolerant to statins in the past. - will check Lipid Panel for risk stratification   5. OSA - not  compliant with CPAP. Unable to tolerate the mask.  6. Stage 3 CKD - creatinine at 1.39 on 11/14/2015. Close to baseline.    Signed, Erma Heritage, PA-C 11/15/2015, 2:48 PM Pager: [phone]  Patient seen, examined. Available data reviewed. Agree with findings, assessment, and plan as outlined by Bernerd Pho, PA-C. The patient is independently interviewed and examined. Her husband is at the bedside. Exam as above with changes made where appropriate. Chart reviewed, imaging and lab studies reviewed. Acute respiratory failure clearly caused by acute on chronic mixed CHF. There seem to be 2 major issues: she is eating out frequently and likely taking in too much sodium, and she is not taking her medications. She is on multi-drug Rx for CHF and admits that she has recently not been taking medications because of concerns over drug interactions. Would continue IV lasix as outlined above as she is clinically improved. I have asked her husband to bring in her pill bottles and will ask for a pharmacy consultation in the morning to review polypharmacy and try to optimize medication adherence. Low-sodium diet reviewed with the patient. She understands this is very hard to adhere to with eating out.   Sherren Mocha, M.D. 11/15/2015 6:44 PM

## 2015-11-15 NOTE — ED Notes (Signed)
Pt contacted for delay in CMP; 10 more mins before results per lab tech

## 2015-11-15 NOTE — Progress Notes (Signed)
In hospital. 

## 2015-11-15 NOTE — Assessment & Plan Note (Signed)
Patient in hospital.

## 2015-11-16 ENCOUNTER — Ambulatory Visit (HOSPITAL_BASED_OUTPATIENT_CLINIC_OR_DEPARTMENT_OTHER): Payer: Commercial Managed Care - HMO

## 2015-11-16 ENCOUNTER — Encounter: Payer: Self-pay | Admitting: Cardiovascular Disease

## 2015-11-16 DIAGNOSIS — I5043 Acute on chronic combined systolic (congestive) and diastolic (congestive) heart failure: Secondary | ICD-10-CM | POA: Diagnosis not present

## 2015-11-16 DIAGNOSIS — I509 Heart failure, unspecified: Secondary | ICD-10-CM

## 2015-11-16 LAB — GLUCOSE, CAPILLARY
GLUCOSE-CAPILLARY: 146 mg/dL — AB (ref 65–99)
Glucose-Capillary: 156 mg/dL — ABNORMAL HIGH (ref 65–99)
Glucose-Capillary: 180 mg/dL — ABNORMAL HIGH (ref 65–99)
Glucose-Capillary: 200 mg/dL — ABNORMAL HIGH (ref 65–99)

## 2015-11-16 LAB — BASIC METABOLIC PANEL
ANION GAP: 9 (ref 5–15)
BUN: 23 mg/dL — AB (ref 6–20)
CHLORIDE: 101 mmol/L (ref 101–111)
CO2: 27 mmol/L (ref 22–32)
Calcium: 8.6 mg/dL — ABNORMAL LOW (ref 8.9–10.3)
Creatinine, Ser: 1.33 mg/dL — ABNORMAL HIGH (ref 0.44–1.00)
GFR, EST AFRICAN AMERICAN: 40 mL/min — AB (ref >60)
GFR, EST NON AFRICAN AMERICAN: 35 mL/min — AB (ref >60)
Glucose, Bld: 196 mg/dL — ABNORMAL HIGH (ref 65–99)
POTASSIUM: 3.4 mmol/L — AB (ref 3.5–5.1)
SODIUM: 137 mmol/L (ref 135–145)

## 2015-11-16 LAB — CBC
HEMATOCRIT: 34.8 % — AB (ref 36.0–46.0)
HEMOGLOBIN: 11.3 g/dL — AB (ref 12.0–15.0)
MCH: 29.2 pg (ref 26.0–34.0)
MCHC: 32.5 g/dL (ref 30.0–36.0)
MCV: 89.9 fL (ref 78.0–100.0)
PLATELETS: 167 10*3/uL (ref 150–400)
RBC: 3.87 MIL/uL (ref 3.87–5.11)
RDW: 12.6 % (ref 11.5–15.5)
WBC: 6.5 10*3/uL (ref 4.0–10.5)

## 2015-11-16 LAB — ECHOCARDIOGRAM COMPLETE
Height: 65 in
Weight: 2712.54 oz

## 2015-11-16 LAB — LIPID PANEL
CHOLESTEROL: 230 mg/dL — AB (ref 0–200)
HDL: 68 mg/dL (ref >40)
LDL Cholesterol: 143 mg/dL — ABNORMAL HIGH (ref 0–99)
TRIGLYCERIDES: 97 mg/dL (ref <150)
Total CHOL/HDL Ratio: 3.4 RATIO
VLDL: 19 mg/dL (ref 0–40)

## 2015-11-16 MED ORDER — LEVALBUTEROL HCL 1.25 MG/0.5ML IN NEBU
1.2500 mg | INHALATION_SOLUTION | Freq: Four times a day (QID) | RESPIRATORY_TRACT | Status: DC | PRN
  Filled 2015-11-16: qty 0.5

## 2015-11-16 MED ORDER — FUROSEMIDE 40 MG PO TABS
40.0000 mg | ORAL_TABLET | Freq: Two times a day (BID) | ORAL | Status: DC
  Administered 2015-11-16 – 2015-11-17 (×2): 40 mg via ORAL
  Filled 2015-11-16 (×2): qty 1

## 2015-11-16 MED ORDER — LEVALBUTEROL HCL 1.25 MG/0.5ML IN NEBU
1.2500 mg | INHALATION_SOLUTION | Freq: Three times a day (TID) | RESPIRATORY_TRACT | Status: DC
  Administered 2015-11-16 (×3): 1.25 mg via RESPIRATORY_TRACT
  Filled 2015-11-16 (×3): qty 0.5

## 2015-11-16 NOTE — Progress Notes (Signed)
  Echocardiogram 2D Echocardiogram has been performed.  Darlina Sicilian M 11/16/2015, 2:36 PM

## 2015-11-16 NOTE — Progress Notes (Signed)
Patient lying in bed, husband at bedside. IV was on the bedside table. Patient stated that she pulled it out because it was hanging. No bleeding at site, catheter was intact. IV consult placed. Call light within reach.

## 2015-11-16 NOTE — Evaluation (Signed)
Occupational Therapy Evaluation Patient Details Name: Mary Cook MRN: [id number] DOB: 16-Aug-1926 Today's Date: 11/16/2015    History of Present Illness Patient is a 82 y/o female with of HTN, OA, DM, systolic and diastolic congestive heart failure with EF of 30-35 percent with grade 1 diastolic dysfunction, CKD, CAD, HLD, GIB, OSA, cerebral aneurysm, CVA, CHF, T12 comp fx presents with SOB and CP. Of note, hospitalized from 3/5-3/7 because of atypical chest pain. Found to have BNP 515.6, troponin 0.06. CXR shows mild cardiomegaly with possible mild congestive changes and a small left pleural effusion   Clinical Impression   Prior to admission, pt ambulated with a cane and performed ADL and IADL at a modified independent level.  She has baseline memory deficits. Pt presents with decreased activity tolerance, impaired balance and generalized weakness interfering with her ability to perform at her baseline.  Will follow acutely. Recommending HHOT and pt is agreeable.    Follow Up Recommendations  Home health OT;Supervision/Assistance - 24 hour    Equipment Recommendations  None recommended by OT    Recommendations for Other Services       Precautions / Restrictions Precautions Precautions: Fall Restrictions Weight Bearing Restrictions: No      Mobility Bed Mobility               General bed mobility comments: in chair  Transfers Overall transfer level: Needs assistance Equipment used: Rolling walker (2 wheeled) Transfers: Sit to/from Stand Sit to Stand: Min assist         General transfer comment: assist from chair and toilet    Balance Overall balance assessment: Needs assistance Sitting-balance support: Feet supported;No upper extremity supported Sitting balance-Leahy Scale: Good     Standing balance support: During functional activity Standing balance-Leahy Scale: Fair Standing balance comment: Able to stand statically unsupported for short periods  but require UE support for ambulation.                            ADL Overall ADL's : Needs assistance/impaired Eating/Feeding: Independent;Sitting   Grooming: Wash/dry hands;Standing;Supervision/safety   Upper Body Bathing: Set up;Sitting   Lower Body Bathing: Minimal assistance;Sit to/from stand   Upper Body Dressing : Set up;Sitting   Lower Body Dressing: Minimal assistance;Sit to/from stand Lower Body Dressing Details (indicate cue type and reason): able to don socks Toilet Transfer: Min guard;Ambulation;RW;Comfort height toilet;Grab bars   Toileting- Clothing Manipulation and Hygiene: Minimal assistance;Sit to/from stand       Functional mobility during ADLs: Min guard;Rolling walker General ADL Comments: decreased activity tolerance     Vision     Perception     Praxis      Pertinent Vitals/Pain Pain Assessment: Faces Faces Pain Scale: Hurts little more Pain Location: B LEs with ambulation Pain Descriptors / Indicators: Sore Pain Intervention(s): Monitored during session;Repositioned     Hand Dominance Right   Extremity/Trunk Assessment Upper Extremity Assessment Upper Extremity Assessment: Generalized weakness   Lower Extremity Assessment Lower Extremity Assessment: Defer to PT evaluation       Communication Communication Communication: No difficulties   Cognition Arousal/Alertness: Awake/alert Behavior During Therapy: WFL for tasks assessed/performed Overall Cognitive Status: History of cognitive impairments - at baseline       Memory: Decreased short-term memory             General Comments       Exercises       Shoulder Instructions  Home Living Family/patient expects to be discharged to:: Private residence Living Arrangements: Spouse/significant other Available Help at Discharge: Family;Available 24 hours/day Type of Home: House Home Access: Level entry     Home Layout: Two level;Able to live on main level  with bedroom/bathroom     Bathroom Shower/Tub: Tub/shower unit   Bathroom Toilet: Handicapped height     Home Equipment: Busby - single point;Walker - 2 wheels;Shower seat;Hand held shower head;Grab bars - tub/shower   Additional Comments: second hand WC, and a 3 wheeled walker.       Prior Functioning/Environment Level of Independence: Needs assistance  Gait / Transfers Assistance Needed: Limited community distance ambulation with SPC, slow and easily fatigued.  ADL's / Homemaking Assistance Needed: Indep, husband does most cooking.    Comments: pt has stopped driving, husband does errands    OT Diagnosis: Generalized weakness;Cognitive deficits   OT Problem List: Decreased strength;Decreased activity tolerance;Impaired balance (sitting and/or standing);Decreased cognition;Cardiopulmonary status limiting activity;Pain   OT Treatment/Interventions: Self-care/ADL training;Energy conservation;Patient/family education;Balance training;Therapeutic activities;Therapeutic exercise    OT Goals(Current goals can be found in the care plan section) Acute Rehab OT Goals Patient Stated Goal: to get stronger and be able to breathe OT Goal Formulation: With patient Time For Goal Achievement: 11/30/15 Potential to Achieve Goals: Good ADL Goals Pt Will Perform Grooming: with modified independence;standing Pt Will Perform Lower Body Bathing: with modified independence;sit to/from stand Pt Will Perform Lower Body Dressing: with modified independence;sit to/from stand Pt Will Transfer to Toilet: with modified independence;ambulating Pt Will Perform Toileting - Clothing Manipulation and hygiene: with modified independence;sit to/from stand Pt/caregiver will Perform Home Exercise Program: Increased strength;Both right and left upper extremity;With theraband Additional ADL Goal #1: Pt will generalize energy conservation strategies in ADL and mobility.  OT Frequency: Min 2X/week   Barriers to D/C:             Co-evaluation              End of Session Equipment Utilized During Treatment: Gait belt;Rolling walker  Activity Tolerance: Patient limited by fatigue Patient left: in chair;with call bell/phone within reach (pharmacist in room)   Time: [id number] OT Time Calculation (min): 21 min Charges:  OT General Charges $OT Visit: 1 Procedure OT Evaluation $OT Eval Moderate Complexity: 1 Procedure G-Codes: OT G-codes **NOT FOR INPATIENT CLASS** Functional Assessment Tool Used: clinical judgement Functional Limitation: Self care Self Care Current Status [id number]): At least 20 percent but less than 40 percent impaired, limited or restricted Self Care Goal Status [id number]): At least 1 percent but less than 20 percent impaired, limited or restricted Self Care Discharge Status [phone]): At least 20 percent but less than 40 percent impaired, limited or restricted  Malka So 11/16/2015, 2:27 PM  [phone]

## 2015-11-16 NOTE — Progress Notes (Addendum)
    Subjective:  Doesn't feel good this morning. Complains of feeling weak. No chest pain.  Objective:  Vital Signs in the last 24 hours: Temp:  [97.7 F (36.5 C)-98.1 F (36.7 C)] 98.1 F (36.7 C) (03/23 0440) Pulse Rate:  [57-78] 70 (03/23 0440) Resp:  [17-25] 18 (03/23 0440) BP: (124-149)/(51-80) 124/51 mmHg (03/23 0440) SpO2:  [92 %-100 %] 96 % (03/23 0732) Weight:  [169 lb 8.5 oz (76.9 kg)] 169 lb 8.5 oz (76.9 kg) (03/23 0440)  Intake/Output from previous day: 03/22 0701 - 03/23 0700 In: 240 [P.O.:240] Out: 1000 [Urine:1000]  Physical Exam: Pt is alert and oriented, elderly woman in NAD HEENT: normal Neck: JVP - normal Lungs: CTA bilaterally CV: RRR without murmur or gallop Abd: soft, NT, Positive BS, no hepatomegaly Ext: no C/C/E Skin: warm/dry no rash  Lab Results:  Recent Labs  11/14/15 2300 11/16/15 0240  WBC 11.2* 6.5  HGB 12.2 11.3*  PLT 186 167    Recent Labs  11/14/15 2300 11/16/15 0240  NA 136 137  K 3.4* 3.4*  CL 102 101  CO2 25 27  GLUCOSE 240* 196*  BUN 15 23*  CREATININE 1.39* 1.33*    Recent Labs  11/15/15 0845 11/15/15 1555  TROPONINI 0.11* 0.12*   Cardiac Studies: 2D Echo pending  Tele: Personally reviewed: normal sinus rhythm without arrhythmia  Assessment/Plan:  1. Acute on chronic systolic and diastolic heart failure: Patient has diuresed well and her weight is down 4 pounds from yesterday. I think she probably has a tight therapeutic window with diuresis and may be she is weak today because of overdiuresis. Will change her to oral Lasix today. Otherwise continue current cardiac medicines. Will repeat an x-ray and BNP tomorrow morning.  2. Elevated troponin, suspect demand ischemia in the setting of congestive heart failure.  3. Hypertension with CHF and CKD: Appears stable today on current treatment.  We'll ask for a pharmacy consultation today as the patient has not been taking her medicines at home. Request review  of medicines, patient education in setting of polypharmacy and noncompliance.   Sherren Mocha, M.D. 11/16/2015, 9:45 AM

## 2015-11-16 NOTE — Evaluation (Signed)
Physical Therapy Evaluation Patient Details Name: Mary Cook MRN: [id number] DOB: 05-08-1926 Today's Date: 11/16/2015   History of Present Illness  Patient is a 82 y/o female with of HTN, OA, DM, systolic and diastolic congestive heart failure with EF of 30-35 percent with grade 1 diastolic dysfunction, CKD, CAD, HLD, GIB, OSA, cerebral aneurysm, CVA, CHF, T12 comp fx presents with SOB and CP. Of note, hospitalized from 3/5-3/7 because of atypical chest pain. Found to have BNP 515.6, troponin 0.06. CXR shows mild cardiomegaly with possible mild congestive changes and a small left pleural effusion  Clinical Impression  Patient presents with weakness, dyspnea on exertion, deconditioning, pain in LEs and impaired endurance impacting safe mobility. Tolerated short distance ambulation with RW but fatigues easily limiting distance. Sp02 dropped to 88% on RA but recovered quickly at rest. Pt reports getting dizzy at home. Discussed importance of waiting a few minutes with change in position due to increased fall risk when dizzy. Lives at home with spouse. Encouraged use of RW at home and would benefit from HHPT to maximize independence and mobility and decrease fall risk at home. Will follow acutely.     Follow Up Recommendations Home health PT;Supervision/Assistance - 24 hour    Equipment Recommendations  None recommended by PT    Recommendations for Other Services OT consult     Precautions / Restrictions Precautions Precautions: Fall Restrictions Weight Bearing Restrictions: No      Mobility  Bed Mobility               General bed mobility comments: Sitting in chair upon PT arrival.   Transfers Overall transfer level: Needs assistance Equipment used: Rolling walker (2 wheeled) Transfers: Sit to/from Stand Sit to Stand: Min assist         General transfer comment: Min A to boost from low chair. Increased difficulty. Reports "feeling weak today." + dizziness standing  - chronic.  Ambulation/Gait Ambulation/Gait assistance: Min guard Ambulation Distance (Feet): 50 Feet Assistive device: Rolling walker (2 wheeled) Gait Pattern/deviations: Step-through pattern;Decreased stride length;Trunk flexed Gait velocity: decreased   General Gait Details: Slow, unsteady gait with 3/4 DOE. Sp02 88% on RA, quickly recovered to high 97% at rest. Limited by SOB/weakness.  Stairs            Wheelchair Mobility    Modified Rankin (Stroke Patients Only)       Balance Overall balance assessment: Needs assistance Sitting-balance support: Feet supported;No upper extremity supported Sitting balance-Leahy Scale: Good     Standing balance support: During functional activity Standing balance-Leahy Scale: Fair Standing balance comment: Able to stand statically unsupported for short periods but require UE support for ambulation.                             Pertinent Vitals/Pain Pain Assessment: Faces Faces Pain Scale: Hurts little more Pain Location: BLEs (due to cramping last night) Pain Descriptors / Indicators: Sore Pain Intervention(s): Monitored during session;Repositioned    Home Living Family/patient expects to be discharged to:: Private residence Living Arrangements: Spouse/significant other Available Help at Discharge: Family;Available 24 hours/day Type of Home: House Home Access: Level entry     Home Layout: Two level;Able to live on main level with bedroom/bathroom Home Equipment: Kasandra Knudsen - single point;Walker - 2 wheels Additional Comments: second hand WC, and a 3 wheeled walker.     Prior Function Level of Independence: Needs assistance   Gait / Transfers Assistance Needed: Limited  community distance ambulation with SPC, slow and easily fatigued.   ADL's / Homemaking Assistance Needed: Indep, husband does most cooking.         Hand Dominance        Extremity/Trunk Assessment   Upper Extremity Assessment: Defer to OT  evaluation           Lower Extremity Assessment: Generalized weakness         Communication   Communication: No difficulties  Cognition Arousal/Alertness: Awake/alert Behavior During Therapy: WFL for tasks assessed/performed Overall Cognitive Status: Within Functional Limits for tasks assessed                      General Comments General comments (skin integrity, edema, etc.): HR stable.    Exercises        Assessment/Plan    PT Assessment Patient needs continued PT services  PT Diagnosis Difficulty walking;Generalized weakness   PT Problem List Decreased strength;Cardiopulmonary status limiting activity;Decreased activity tolerance;Decreased balance;Decreased mobility  PT Treatment Interventions Balance training;Gait training;Functional mobility training;Therapeutic activities;Therapeutic exercise;Patient/family education;DME instruction   PT Goals (Current goals can be found in the Care Plan section) Acute Rehab PT Goals Patient Stated Goal: to get stronger and be able to breathe PT Goal Formulation: With patient Time For Goal Achievement: 11/30/15 Potential to Achieve Goals: Fair    Frequency Min 3X/week   Barriers to discharge        Co-evaluation               End of Session Equipment Utilized During Treatment: Gait belt Activity Tolerance: Patient limited by fatigue;Other (comment) (dyspnea) Patient left: in chair;with call bell/phone within reach Nurse Communication: Mobility status    Functional Assessment Tool Used: c Functional Limitation: Mobility: Walking and moving around Mobility: Walking and Moving Around Current Status [id number]): At least 20 percent but less than 40 percent impaired, limited or restricted Mobility: Walking and Moving Around Goal Status [phone]): At least 1 percent but less than 20 percent impaired, limited or restricted    Time: 1141-1200 PT Time Calculation (min) (ACUTE ONLY): 19 min   Charges:   PT  Evaluation $PT Eval Moderate Complexity: 1 Procedure     PT G Codes:   PT G-Codes **NOT FOR INPATIENT CLASS** Functional Assessment Tool Used: c Functional Limitation: Mobility: Walking and moving around Mobility: Walking and Moving Around Current Status [id number]): At least 20 percent but less than 40 percent impaired, limited or restricted Mobility: Walking and Moving Around Goal Status [phone]): At least 1 percent but less than 20 percent impaired, limited or restricted    Reza Crymes A Lottie Siska 11/16/2015, 12:08 PM Wray Kearns, [place], DPT [phone]

## 2015-11-16 NOTE — Progress Notes (Signed)
Patient ID: Mary Cook, female   DOB: [birth year], 82 y.o.   MRN: [id number]     HPI: Mary Cook is a 82 y.o. female who presents to the office today for a 3 month follow up cardiology evaluation.  She was recently hospitalized with CHF.  Mary Cook has known CAD and in September 2011 suffered a large out of hospital anterior wall myocardial infarction, complicated by CHF and post MI pericarditis.  At that time, she had significant thrombus burden requiring thrombectomy.  She underwent stenting of her left circumflex coronary artery with tandem 3.015 mm Promus stents postdilated 3.5 mm meters.  She had concomitant CAD involving her diagonal branch of her LAD as well as RCA.  Last year she was readmitted to the hospital with increasing shortness of breath.  A nuclear study which demonstrated inferior apical ischemia. On 06/08/2014,  repeat cardiac catheterization by me showed moderate LV dysfunction with global hypokinesis and slightly more pronounced inferior hypocontractility.  Ejection fraction was 35%.  There was moderate CAD with 50-60% smooth eccentric stenosis in the first diagonal branch of the LAD, widely patent proximal tandem stents in the circumflex vessel with a focal 70% stenosis of a small AV groove circumflex, and 50-60% stenosis in the proximal RCA with mild mid systolic bridging in a dominant RCA system.  Increased medical therapy was recommended.  She also has a history of obstructive sleep apnea and had been using CPAP therapy.  Presently, she admits to 100% compliance.  She is unaware of breakthrough snoring.  She is diabetic on metformin 500 mg twice a day.  She also has a history of hyperlipidemia for which she has been taking atorvastatin 40 mg.  There is a history of hypertension for which she has been on furosemide 40 mg in addition to her isosorbide.  She  sustained a fall and was evaluated in the emergency room by Dr. Fuller Song in October 2015.  CT of her head  was negative, although she had chronic white matter ischemic change and diffuse severe degenerative changes.  She was also noted to have carotid atherosclerotic vascular disease.  When I saw her in October 2016 since she had had several recent admissions with  acute on chronic combined systolic and diastolic heart failure and had an ejection fraction of 30-35% , I recommended initiation of Entresto at 24/26 and set up a follow up appointment to see [facility] DO NOT RESUSCITATE office several weeks later for dose titration.  She stated that she had felt better when she had taken the medicine, but never followed up with the subsequent evaluation and consequently stopped taking the medicine.  Once the samples ran out.  When I saw her last in January 2017, I initiated entrust oh and provided her with 2 weeks.  Samples of 24/26 and then titrated her to 49/51.  She states that she has felt fairly well with this medical regimen.  She denies recent significant change in symptomatology with reference to shortness of breath.  Lab work 2 weeks ago revealed a creatinine of 1.17.  She had normal LFTs.  Her potassium was 3.5.  She had undergone a nuclear perfusion study on 10/29/2015 which showed findings consistent with prior MI with very mild peri-infarction ischemia involving the inferolateral wall.  She denies chest pressure.  She is unaware of palpitations.  She denies PND, orthopnea.  She presents for evaluation.  Past Medical History  Diagnosis Date  . Essential hypertension   . Osteoarthritis   .  Osteopenia   . Diverticulosis of colon   . Type II diabetes mellitus ([place])   . CAD (coronary artery disease)     a. 04/2010 MI DES x 2 to LCX;  b. 05/2014 Cath: EF 35%, patent LCX stents w/ 70% stenosis in small AV groove LCX, Diag 50-60, RCA 50-60p->Med Rx.  Marland Kitchen Hyperlipidemia   . GIB (gastrointestinal bleeding)     a. 2002 Diverticular bleed.  . Skin disorder   . Visual disorder   . Thrombus   . Sleep apnea       cpap therapy  . Frequent urination   . Cerebral aneurysm   . Stroke ([place]) 2002  . Chronic combined systolic (congestive) and diastolic (congestive) heart failure ([place])     a. 01/2015 Echo: EF 30-35%, sev inflat HK, Gr 1DD, mild AI.  . Ischemic cardiomyopathy     a. 01/2015 Echo: EF 30-35%.  . T12 compression fracture ([place])     a. 04/2015.    Past Surgical History  Procedure Laterality Date  . Abdominal hysterectomy  1980    no bso  . Coronary angioplasty with stent placement  05/11/2010    stent CFX  . Cardiac catheterization  07/2011    LAD OK, D1 80%, CFX 30% w/ patent stent, RCA 50-60%, EF 40%  . Thrombectomy    . Cardiac catheterization    . Doppler echocardiography    . Myocardial perfusion study    . Left heart catheterization with coronary angiogram N/A 08/18/2011    Procedure: LEFT HEART CATHETERIZATION WITH CORONARY ANGIOGRAM;  Surgeon: Wellington Hampshire, MD;  Location: [facility] CATH LAB;  Service: Cardiovascular;  Laterality: N/A;  . Left heart catheterization with coronary angiogram N/A 06/08/2014    Procedure: LEFT HEART CATHETERIZATION WITH CORONARY ANGIOGRAM;  Surgeon: Troy Sine, MD;  Location: [facility] CATH LAB;  Service: Cardiovascular;  Laterality: N/A;  . Tonsillectomy    . Appendectomy      Allergies  Allergen Reactions  . Clopidogrel Bisulfate Nausea And Vomiting    Patient is not aware of this allergy  . Lipitor [Atorvastatin] Other (See Comments)    Weak legs  . Lisinopril Cough    Patient is not aware of this allergy    No current facility-administered medications for this visit.   No current outpatient prescriptions on file.   Facility-Administered Medications Ordered in Other Visits  Medication Dose Route Frequency Provider Last Rate Last Dose  . 0.9 %  sodium chloride infusion  250 mL Intravenous PRN Ivor Costa, MD      . acetaminophen (TYLENOL) tablet 650 mg  650 mg Oral Q4H PRN Ivor Costa, MD      . aspirin EC tablet 81 mg  81 mg Oral Daily Ivor Costa, MD   81 mg at 11/16/15 1011  . carvedilol (COREG) tablet 6.25 mg  6.25 mg Oral BID WC Ivor Costa, MD   6.25 mg at 11/16/15 1739  . citalopram (CELEXA) tablet 20 mg  20 mg Oral Daily Ivor Costa, MD   20 mg at 11/16/15 1011  . donepezil (ARICEPT) tablet 5 mg  5 mg Oral QHS Ivor Costa, MD   5 mg at 11/15/15 2231  . enoxaparin (LOVENOX) injection 30 mg  30 mg Subcutaneous Daily Ivor Costa, MD   30 mg at 11/16/15 1011  . furosemide (LASIX) tablet 40 mg  40 mg Oral BID Sherren Mocha, MD   40 mg at 11/16/15 1739  . insulin aspart (novoLOG) injection 0-5 Units  0-5 Units Subcutaneous QHS Ivor Costa, MD   0 Units at 11/15/15 2255  . insulin aspart (novoLOG) injection 0-9 Units  0-9 Units Subcutaneous TID WC Ivor Costa, MD   2 Units at 11/16/15 1741  . levalbuterol (XOPENEX) nebulizer solution 1.25 mg  1.25 mg Nebulization TID Caren Griffins, MD   1.25 mg at 11/16/15 1323  . morphine 2 MG/ML injection 2 mg  2 mg Intravenous Q4H PRN Ivor Costa, MD      . nitroGLYCERIN (NITROSTAT) SL tablet 0.4 mg  0.4 mg Sublingual Q5 min PRN Ivor Costa, MD      . ondansetron [facility]) injection 4 mg  4 mg Intravenous Q6H PRN Ivor Costa, MD      . pantoprazole (PROTONIX) EC tablet 40 mg  40 mg Oral Daily Ivor Costa, MD   40 mg at 11/16/15 1011  . sacubitril-valsartan (ENTRESTO) 49-51 mg per tablet  1 tablet Oral BID Ivor Costa, MD   1 tablet at 11/16/15 1011  . sodium chloride flush (NS) 0.9 % injection 3 mL  3 mL Intravenous Q12H Ivor Costa, MD   3 mL at 11/16/15 1013  . sodium chloride flush (NS) 0.9 % injection 3 mL  3 mL Intravenous PRN Ivor Costa, MD      . zolpidem (AMBIEN) tablet 5 mg  5 mg Oral QHS PRN Ivor Costa, MD   5 mg at 11/15/15 0459    Social History   Social History  . Marital Status: Married    Spouse Name: N/A  . Number of Children: N/A  . Years of Education: N/A   Occupational History  . Retired    Social History Main Topics  . Smoking status: Never Smoker   . Smokeless tobacco: Never Used  .  Alcohol Use: No  . Drug Use: No  . Sexual Activity: Not on file   Other Topics Concern  . Not on file   Social History Narrative    Family History  Problem Relation Age of Onset  . Hypertension Other   . CAD      father    ROS General: Negative; No fevers, chills, or night sweats HEENT: Negative; No changes in vision or hearing, sinus congestion, difficulty swallowing Pulmonary: Negative; No cough, wheezing, shortness of breath, hemoptysis Cardiovascular: See HPI:  GI: positive for GERD; No nausea, vomiting, diarrhea, or abdominal pain GU: Negative; No dysuria, hematuria, or difficulty voiding Musculoskeletal: degenerative changes to her cervical spine.  Arthritis;  Hematologic: Negative; no easy bruising, bleeding Endocrine: positive for diabetes mellitus Neuro: Negative; no changes in balance, headaches Skin: Negative; No rashes or skin lesions Psychiatric: Negative; No behavioral problems, depression Sleep: Negative; No snoring,  daytime sleepiness, hypersomnolence, bruxism, restless legs, hypnogognic hallucinations. Other comprehensive 14 point system review is negative   Physical Exam BP 140/100 mmHg  Pulse 76  Ht '5\' 5"'$  (1.651 m)  Wt 177 lb (80.287 kg)  BMI 29.45 kg/m2  Repeat blood pressure by me was 138/88  Wt Readings from Last 3 Encounters:  11/16/15 169 lb 8.5 oz (76.9 kg)  11/14/15 177 lb (80.287 kg)  10/29/15 173 lb 1 oz (78.5 kg)   General: Alert, oriented, no distress.  Skin: normal turgor, no rashes, warm and dry HEENT: Normocephalic, atraumatic. Pupils equal round and reactive to light; sclera anicteric; extraocular muscles intact, No lid lag; Nose without nasal septal hypertrophy; Mouth/Parynx benign; Mallinpatti scale 3 Neck: No JVD, no carotid bruits; normal carotid upstroke Lungs: clear to ausculatation and percussion  bilaterally; no wheezing or rales, normal inspiratory and expiratory effort Chest wall: without tenderness to palpitation Heart:  PMI not displaced, RRR, s1 s2 normal, 1/6 systolic murmur, No diastolic murmur, no rubs, gallops, thrills, or heaves Abdomen: soft, nontender; no hepatosplenomehaly, BS+; abdominal aorta nontender and not dilated by palpation. Back: no CVA tenderness Pulses: 2+  Musculoskeletal: full range of motion, normal strength, no joint deformities Extremities: Pulses 2+, no clubbing cyanosis or edema, Homan's sign negative  Neurologic: grossly nonfocal; Cranial nerves grossly wnl Psychologic: Normal mood and affect  ECG (independently read by me): Normal sinus rhythm at 76 bpm.  LVH by voltage criteria.  January 2017 ECG (independently read by me):  Normal sinus rhythm at 73 bpm.  LVH.  Nonspecific T changes.  ECG (independently read by me): Normal sinus rhythm at 68 bpm.  H by voltage.  T-wave abnormality in leads 1 and aVL.  January 2016 ECG (independently read by me): Normal sinus rhythm at 68 bpm.  LVH by voltage in aVL.  Nonspecific T changes.  November 2015 ECG (independently read by me): sinus bradycardia 59 bpm.  LVH with repolarization changes.  Nonspecific T changes.   LABS: BMP Latest Ref Rng 11/16/2015 11/14/2015 10/30/2015  Glucose 65 - 99 mg/dL 196(H) 240(H) 175(H)  BUN 6 - 20 mg/dL 23(H) 15 18  Creatinine 0.44 - 1.00 mg/dL 1.33(H) 1.39(H) 1.17(H)  Sodium 135 - 145 mmol/L 137 136 141  Potassium 3.5 - 5.1 mmol/L 3.4(L) 3.4(L) 3.5  Chloride 101 - 111 mmol/L 101 102 107  CO2 22 - 32 mmol/L '27 25 28  '$ Calcium 8.9 - 10.3 mg/dL 8.6(L) 9.2 8.9   Hepatic Function Latest Ref Rng 11/14/2015 10/30/2015 08/30/2015  Total Protein 6.5 - 8.1 g/dL 7.1 6.4(L) 6.9  Albumin 3.5 - 5.0 g/dL 3.3(L) 3.1(L) 3.8  AST 15 - 41 U/L '27 16 13  '$ ALT 14 - 54 U/L 21 11(L) 8  Alk Phosphatase 38 - 126 U/L 82 71 66  Total Bilirubin 0.3 - 1.2 mg/dL 0.5 0.8 0.4   CBC Latest Ref Rng 11/16/2015 11/14/2015 10/30/2015  WBC 4.0 - 10.5 K/uL 6.5 11.2(H) 5.6  Hemoglobin 12.0 - 15.0 g/dL 11.3(L) 12.2 11.7(L)  Hematocrit 36.0 - 46.0  % 34.8(L) 37.1 34.8(L)  Platelets 150 - 400 K/uL 167 186 185   Lab Results  Component Value Date   MCV 89.9 11/16/2015   MCV 91.4 11/14/2015   MCV 91.1 10/30/2015   Lab Results  Component Value Date   TSH 1.56 10/12/2014   Lab Results  Component Value Date   HGBA1C 8.3* 10/29/2015   BNP (last 3 results)  Recent Labs  04/18/15 1200 05/24/15 1018 11/14/15 2300  BNP 529.8* 424.0* 515.6*    ProBNP (last 3 results) No results for input(s): PROBNP in the last 8760 hours.  Lipid Panel     Component Value Date/Time   CHOL 230* 11/16/2015 0240   TRIG 97 11/16/2015 0240   HDL 68 11/16/2015 0240   CHOLHDL 3.4 11/16/2015 0240   VLDL 19 11/16/2015 0240   LDLCALC 143* 11/16/2015 0240   LDLDIRECT 180.7 12/09/2011 1109    RADIOLOGY: Dg Chest 2 View  10/29/2015  CLINICAL DATA:  Left chest pain and orthopnea. Coronary artery disease and ischemic cardiomyopathy. EXAM: CHEST  2 VIEW COMPARISON:  05/24/2015 FINDINGS: Heart size remains at the upper limits of normal. Both lungs are clear. No evidence of pneumothorax or pleural effusion. IMPRESSION: Stable exam.  No active disease. Electronically Signed   By: Jenny Reichmann  Kris Hartmann M.D.   On: 10/29/2015 19:47   Dg Ribs Unilateral Left  10/30/2015  CLINICAL DATA:  Recent fall, pain right posterior ribs around T8 level EXAM: LEFT RIBS - 2 VIEW COMPARISON:  None. FINDINGS: Hairline fracture posterior lateral right 8th rib. No hemo- or pneumothorax on the right. IMPRESSION: Nondisplaced rib fracture. Electronically Signed   By: Skipper Cliche M.D.   On: 10/30/2015 13:41   Dg Thoracic Spine 4v  10/30/2015  CLINICAL DATA:  Upper back pain after recent fall. EXAM: THORACIC SPINE - 4+ VIEW COMPARISON:  None. FINDINGS: No fracture or spondylolisthesis is noted. Disc spaces are well-maintained. Mild osteophyte formation is noted inferiorly. IMPRESSION: No acute abnormality seen in the thoracic spine. Electronically Signed   By: Marijo Conception, M.D.   On:  10/30/2015 13:36   Ct Angio Chest Pe W/cm &/or Wo Cm  10/29/2015  CLINICAL DATA:  82 year old female with left-sided chest and back pain. Symptom onset last night. EXAM: CT ANGIOGRAPHY CHEST WITH CONTRAST TECHNIQUE: Multidetector CT imaging of the chest was performed using the standard protocol during bolus administration of intravenous contrast. Multiplanar CT image reconstructions and MIPs were obtained to evaluate the vascular anatomy. CONTRAST:  35m OMNIPAQUE IOHEXOL 350 MG/ML SOLN COMPARISON:  Radiographs earlier this day. Ventilation perfusion scan 01/31/2015, chest CT 08/17/2011 FINDINGS: There are no filling defects within the pulmonary arteries to suggest pulmonary embolus. Atherosclerosis of the thoracic aorta without aneurysm or dissection. There is distal aortic tortuosity. Coronary artery calcifications. Multi chamber cardiomegaly. No pericardial effusion. No mediastinal or hilar adenopathy. No pleural effusion. No consolidation to suggest pneumonia. Heterogeneous attenuation in the lower lobes and lingula, may reflect small airways disease versus pulmonary edema. There is minimal smooth septal thickening. No pulmonary mass or suspicious nodule. Hypodense left thyroid nodule measures 1.4 cm. Esophagus is patulous. Small hiatal hernia. No acute abnormality in the included upper abdomen. There are no acute or suspicious osseous abnormalities. Review of the MIP images confirms the above findings. IMPRESSION: 1. No pulmonary embolus. 2. Multi chamber cardiomegaly. Minimal heterogeneous attenuation of the lower lobes is no septal thickening, can be seen with small airways disease versus mild pulmonary edema. 3. Atherosclerosis of thoracic aorta. Coronary artery calcifications. Electronically Signed   By: MJeb LeveringM.D.   On: 10/29/2015 21:02   Nm Myocar Multi W/spect W/wall Motion / Ef  10/30/2015   There was no ST segment deviation noted during stress.  Findings consistent with prior  myocardial infarction with peri-infarct ischemia in the inferolateral wall, this is consistent with her known LCX infarct and similar finding to her prior nuclear stress 06/07/2014  This is a high risk study. High risk due to low ejection fraction. There is large infarct with fairly mild amount of myocardium currently at jeopardy.  The left ventricular ejection fraction is severely decreased (<30%).    Dg Chest Port 1 View  11/14/2015  CLINICAL DATA:  82year old female with shortness of breath EXAM: PORTABLE CHEST 1 VIEW COMPARISON:  Chest CT dated 10/29/2015 FINDINGS: There is mild cardiomegaly with mild increased interstitial prominence which may represent a degree of congestive changes or edema. A small left pleural effusion is noted. There is no pneumothorax. The lungs are otherwise clear. No focal consolidation. There is degenerative changes of the spine and shoulders. No acute osseous pathology. IMPRESSION: Mild cardiomegaly with possible mild congestive changes and a small left pleural effusion. Electronically Signed   By: AAnner CreteM.D.   On: 11/14/2015 22:27  ASSESSMENT AND PLAN: Mrs. Kishbaugh  is an 82year-old female who suffered a left circumflex MI in 2011 and underwent tandem stenting of her proximal circumflex vessel.  Cardiac catheterization on 06/08/2014 showed moderate LV dysfunction with moderate multivessel CAD but widely patent tandem stents in the circumflex vessel.  An echo Doppler study in June 2016 showed an ejection fraction at 30-35% with severe LVH.  There was grade 1 diastolic dysfunction and she had abnormal tissue Doppler suggesting high ventricular filling pressure;  mild aortic insufficiency and mitral annular calcification.  She has had several readmissions with acute on chronic combined systolic and diastolic heart failure.  When I last saw her, she had been on carvedilol 3.125 mg twice a day, Lasix 40 mg daily, irbesartan 75 mg daily, and isosorbide  mononitrate 60 mg.    With her recurrent admissions with CHF with elevated BNP and reduced LV function, I felt that she was a good candidate to initiate Entresto , which is combination sacubitrill/valsartan.  Her irbesartan was discontinued.  Since I last saw her, she reinstituted 24/26 dosing and ultimately was titrated to 49/51.  On exam today in the office.  She was not in overt heart failure.  There were no rales on lung examination.  Her resting pulse was 76.  I recommended slight additional titration of carvedilol from 6.25 mg twice a day to 9.375 mg twice a day for at least the next week with then depending upon tolerability further titration to 12.5 mg twice a day.  I also recommended a follow-up proBNP and be met.  She does have memory issues and is on Aricept.  She is diabetic on metformin.  Her GERD has been controlled with omeprazole.  She is to follow-up in 6 weeks for reevaluation or sooner if recurrent symptoms develop.   Time spent: 25 minutes  Troy Sine, MD, [facility]  11/16/2015 8:03 PM

## 2015-11-16 NOTE — Progress Notes (Signed)
PROGRESS NOTE  Mary Cook [id number] DOB: 14-Jun-1926 DOA: 11/14/2015 PCP: Walker Kehr, MD Outpatient Specialists:      Brief Narrative: 82 y.o. female with PMH of combat his systolic and diastolic congestive heart failure with EF of 30-35 percent with grade 1 diastolic dysfunction, hypertension, hyperlipidemia, diabetes mellitus, GERD, depression, CAD, s/p of stent placement 2011, GI bleeding, OSA not on CPAP, stroke, cerebral aneurysm, chronic kidney disease-stage III, who presents with shortness breath and chest pain.  Assessment & Plan: Principal Problem:   Acute on chronic combined systolic and diastolic heart failure, EF 30-35% Active Problems:   Diabetes mellitus ([place])   Essential hypertension   CAD- Ant MI, CFX DES 9/11, no ISR or progression at cath 2012   Sleep apnea, cpap had been stopped secondary to "sinus issues"   GERD (gastroesophageal reflux disease)   Chest pain   Alzheimer's disease   Chronic kidney disease stage III   LBP (low back pain)   Dyspnea   Acute respiratory failure with hypoxia (HCC)   CHF exacerbation (HCC)   Acute on chronic combined systolic and diastolic heart failure: Patient's shortness of breath, elevated BNP and leg edema are consistent with CHF exacerbation. 2-D echo on 01/31/15 showed EF 30 - 35 %ercent with grade 1 diastolic dysfunction. - continue diuresis, transition to po Lasix today - net negative 2.6L, down 4 lbs - cardiology following.  Chest pain and CAD - patient with history of recurrent chest pain, just hospitalized with same 2 weeks ago - saw Dr. Claiborne Billings yesterday, states that she was feeling "fine" then when she arrived home she was very dyspneic - continue aspirin, Plavix, Coreg - When necessary nitroglycerin and morphine  - likely demand ischemia, troponin flat  DM-II - Last A1c 8.3 on 10/29/15, poorly controled. Patient is taking metformin at home - SSI  GERD - Protonix  Hypertension - On IV Lasix and  Entresto  Hx of stroke - Continue aspirin  Alzheimer's disease -Continue donepezil  Chronic kidney disease stage III - baseline creatinine 1.1-1.5, her creatinine is 1.39, which is close to baseline. - Follow-up renal Fx by BMP  Depression - Continue home medications: Celexa  LBP (low back pain): - Flexeril   DVT prophylaxis: Lovenox Code Status: Full Family Communication: d/w husband bedside Disposition Plan: home when ready. PT eval pending. Perhaps home 1 day Barriers for discharge: diuresis, cards evaluation.   Consultants:   Cardiology   Procedures:   None   Antimicrobials:  None    Subjective: - weak today, denies shortness of breath, no chest pain. Overall feeling improved however.  Objective: Filed Vitals:   11/15/15 2058 11/15/15 2136 11/16/15 0440 11/16/15 0732  BP:  137/70 124/51   Pulse: 72 70 70   Temp:  97.7 F (36.5 C) 98.1 F (36.7 C)   TempSrc:  Oral Oral   Resp: 17 18 18    Height:      Weight:   76.9 kg (169 lb 8.5 oz)   SpO2: 97% 96% 95% 96%    Intake/Output Summary (Last 24 hours) at 11/16/15 1122 Last data filed at 11/15/15 2333  Gross per 24 hour  Intake    240 ml  Output    700 ml  Net   -460 ml   Filed Weights   11/15/15 0132 11/15/15 1431 11/16/15 0440  Weight: 78.472 kg (173 lb) 76.9 kg (169 lb 8.5 oz) 76.9 kg (169 lb 8.5 oz)    Examination: BP 124/51 mmHg  Pulse 70  Temp(Src) 98.1 F (36.7 C) (Oral)  Resp 18  Ht 5\' 5"  (1.651 m)  Wt 76.9 kg (169 lb 8.5 oz)  BMI 28.21 kg/m2  SpO2 96%  GENERAL: NAD  HEENT: head NCAT, no scleral icterus. Pupils round and reactive.   NECK: Supple. No carotid bruits. No lymphadenopathy or thyromegaly.  LUNGS: Clear to auscultation. No wheezing or crackles  HEART: Regular rate and rhythm. 2+ pulses, no JVD, no peripheral edema  ABDOMEN: Soft, nontender, and nondistended. Positive bowel sounds.   EXTREMITIES: Without any cyanosis or clubbing  NEUROLOGIC: Alert and oriented  x3. Cranial nerves II through XII are grossly intact. Strength 5/5 in all 4.    Data Reviewed: I have personally reviewed following labs and imaging studies  CBC:  Recent Labs Lab 11/14/15 2300 11/16/15 0240  WBC 11.2* 6.5  NEUTROABS 8.6*  --   HGB 12.2 11.3*  HCT 37.1 34.8*  MCV 91.4 89.9  PLT 186 [id number]   Basic Metabolic Panel:  Recent Labs Lab 11/14/15 2300 11/15/15 0425 11/16/15 0240  NA 136  --  137  K 3.4*  --  3.4*  CL 102  --  101  CO2 25  --  27  GLUCOSE 240*  --  196*  BUN 15  --  23*  CREATININE 1.39*  --  1.33*  CALCIUM 9.2  --  8.6*  MG  --  1.8  --    GFR: Estimated Creatinine Clearance: 30.6 mL/min (by C-G formula based on Cr of 1.33). Liver Function Tests:  Recent Labs Lab 11/14/15 2300  AST 27  ALT 21  ALKPHOS 82  BILITOT 0.5  PROT 7.1  ALBUMIN 3.3*   No results for input(s): LIPASE, AMYLASE in the last 168 hours. No results for input(s): AMMONIA in the last 168 hours. Coagulation Profile:  Recent Labs Lab 11/15/15 0228  INR 1.01   Cardiac Enzymes:  Recent Labs Lab 11/15/15 0228 11/15/15 0845 11/15/15 1555  TROPONINI 0.06* 0.11* 0.12*   BNP (last 3 results) No results for input(s): PROBNP in the last 8760 hours. HbA1C: No results for input(s): HGBA1C in the last 72 hours. CBG:  Recent Labs Lab 11/15/15 0750 11/15/15 1225 11/15/15 1737 11/15/15 2242 11/16/15 0707  GLUCAP 198* 189* 262* 137* 156*   Lipid Profile:  Recent Labs  11/16/15 0240  CHOL 230*  HDL 68  LDLCALC 143*  TRIG 97  CHOLHDL 3.4   Thyroid Function Tests: No results for input(s): TSH, T4TOTAL, FREET4, T3FREE, THYROIDAB in the last 72 hours. Anemia Panel: No results for input(s): VITAMINB12, FOLATE, FERRITIN, TIBC, IRON, RETICCTPCT in the last 72 hours. Urine analysis:    Component Value Date/Time   COLORURINE YELLOW 04/28/2015 [place] 04/28/2015 1658   LABSPEC 1.022 04/28/2015 1658   PHURINE 6.0 04/28/2015 1658    GLUCOSEU 500* 04/28/2015 1658   GLUCOSEU NEGATIVE 10/12/2014 1053   HGBUR NEGATIVE 04/28/2015 1658   BILIRUBINUR NEGATIVE 04/28/2015 1658   KETONESUR NEGATIVE 04/28/2015 1658   PROTEINUR NEGATIVE 04/28/2015 1658   UROBILINOGEN 1.0 04/28/2015 1658   NITRITE NEGATIVE 04/28/2015 1658   LEUKOCYTESUR NEGATIVE 04/28/2015 1658   Sepsis Labs: Invalid input(s): PROCALCITONIN, LACTICIDVEN  No results found for this or any previous visit (from the past 240 hour(s)).    Radiology Studies: Dg Chest Port 1 View  11/14/2015  CLINICAL DATA:  82 year old female with shortness of breath EXAM: PORTABLE CHEST 1 VIEW COMPARISON:  Chest CT dated 10/29/2015 FINDINGS: There is mild cardiomegaly with mild  increased interstitial prominence which may represent a degree of congestive changes or edema. A small left pleural effusion is noted. There is no pneumothorax. The lungs are otherwise clear. No focal consolidation. There is degenerative changes of the spine and shoulders. No acute osseous pathology. IMPRESSION: Mild cardiomegaly with possible mild congestive changes and a small left pleural effusion. Electronically Signed   By: Anner Crete M.D.   On: 11/14/2015 22:27     Scheduled Meds: . aspirin EC  81 mg Oral Daily  . carvedilol  6.25 mg Oral BID WC  . citalopram  20 mg Oral Daily  . cyclobenzaprine  5 mg Oral TID  . donepezil  5 mg Oral QHS  . enoxaparin (LOVENOX) injection  30 mg Subcutaneous Daily  . furosemide  40 mg Oral BID  . insulin aspart  0-5 Units Subcutaneous QHS  . insulin aspart  0-9 Units Subcutaneous TID WC  . levalbuterol  1.25 mg Nebulization TID  . pantoprazole  40 mg Oral Daily  . sacubitril-valsartan  1 tablet Oral BID  . sodium chloride flush  3 mL Intravenous Q12H   Continuous Infusions:    Marzetta Board, MD, PhD [facility] Pager [phone]  If 7PM-7AM, please contact night-coverage [email or web address] Password [email or web address] 11/16/2015, 11:22 AM

## 2015-11-16 NOTE — Progress Notes (Signed)
PT Cancellation Note  Patient Details Name: Mary Cook MRN: [id number] DOB: [birth year]   Cancelled Treatment:    Reason Eval/Treat Not Completed: Patient not medically ready Troponin continues to trend upward. Holding PT evaluation at this time. Will follow up.   Marguarite Arbour A Elric Tirado 11/16/2015, 9:57 AM Wray Kearns, PT, DPT [phone]

## 2015-11-16 NOTE — Care Management Note (Addendum)
Case Management Note Marvetta Gibbons RN, BSN Unit 2W-Case Manager [phone]  Patient Details  Name: HANG ORTEGA MRN: [id number] Date of Birth: 06/14/1926  Subjective/Objective:  Pt admitted with acute on Chronic HF                  Action/Plan: PTA pt lived at home with spouse- uses cane, but also has RW and W/C at home if needed. Pt was recently at [facility] and referral was made to outpt rehab for PT/OT-  evals for PT/OT are pending here- CM to follow for d/c needs  Expected Discharge Date:                  Expected Discharge Plan:  [place]  In-House Referral:     Discharge planning Services  CM Consult  Post Acute Care Choice:    Choice offered to:     DME Arranged:    DME Agency:     HH Arranged:    [place] Agency:     Status of Service:  In process, will continue to follow  Medicare Important Message Given:    Date Medicare IM Given:    Medicare IM give by:    Date Additional Medicare IM Given:    Additional Medicare Important Message give by:     If discussed at [place] of Stay Meetings, dates discussed:    Additional Comments:  Dawayne Patricia, RN 11/16/2015, 11:52 AM

## 2015-11-16 NOTE — Progress Notes (Addendum)
Pharmacy note: medication counseling  82 yo female here with SOB here with HF and pharmacy was consulted for medication counseling. I spoke with the patient and her husband. The patient does not know what she is taking at home and her husband is also confused on what medications she should be taking. The patient reports using a pill box at home. Medications bottles were brought in and the medications were reviewed and compared to previous records. I have reviewed the medications and briefly explained the reason or use. I have also encouraged the importance of taking her medications.  Her home medication list has been updated as listed below.  The changes are as follows:  1) she was reported on pantoprazole and omeprazole: she has run out of pantoprazole. I have asked her to continue taking the omeprazole. 2) She no longer takes flexeril (per family)  There were several duplicate medication bottles (metformin, celexa, kdur and others). There are also a number of expired medications or medications she is no longer taking (irbesartan, atorvastatin and others)   I discussed with the family and I will dispose of the duplicate  And expired medications (some bottles have expired).   Per the husband there are three new prescriptions that they have not filled.   Plan -Discontinue flexeril as family states she is no longer taking this -discontinue pantoprazole, continue omepazole -Will dispose of the expired and duplicate medications (discussed with family) -The husband will bring the 3 prescriptions on 3/24. Will follow up on these medications tomorrow  Thank you for asking pharmacy to be involved in the care of this patient.  Hildred Laser, Pharm D 11/16/2015 4:12 PM   Medications:  Prescriptions prior to admission  Medication Sig Dispense Refill Last Dose  . acetaminophen (TYLENOL) 325 MG tablet Take 2 tablets (650 mg total) by mouth every 4 (four) hours as needed for headache or mild pain.   Past  Week at Unknown time  . aspirin 81 MG EC tablet Take 81 mg by mouth daily.     Past Week at Unknown time  . citalopram (CELEXA) 20 MG tablet Take 1 tablet (20 mg total) by mouth daily. 90 tablet 3 Past Week at Unknown time  . donepezil (ARICEPT) 5 MG tablet Take 1 tablet (5 mg total) by mouth at bedtime. 90 tablet 3 11/13/2015 at Unknown time  . furosemide (LASIX) 40 MG tablet Take 1 tablet (40 mg total) by mouth 2 (two) times daily. 90 tablet 3 Past Week at Unknown time  . isosorbide mononitrate (IMDUR) 60 MG 24 hr tablet Take 60 mg by mouth daily.   unknown at unknown  . metFORMIN (GLUCOPHAGE) 500 MG tablet TAKE 1 TABLET TWICE DAILY BEFORE A MEAL 180 tablet 3 Past Week at Unknown time  . omeprazole (PRILOSEC) 20 MG capsule Take 20 mg by mouth daily.   Past Week at Unknown time  . potassium chloride SA (K-DUR,KLOR-CON) 20 MEQ tablet Take 1 tablet (20 mEq total) by mouth daily. 90 tablet 3 Past Week at Unknown time  . sacubitril-valsartan (ENTRESTO) 49-51 MG Take 1 tablet by mouth 2 (two) times daily. 60 tablet 11 11/13/2015 at Unknown time  . UNABLE TO FIND Outpatient PHYSICAL THERAPY  Diagnosis: TIA (transient ischemic attack) 1 Mutually Defined 0 Taking  . carvedilol (COREG) 12.5 MG tablet Take 1 tablet (12.5 mg total) by mouth 2 (two) times daily. 60 tablet 6    Scheduled:  . aspirin EC  81 mg Oral Daily  . carvedilol  6.25 mg Oral BID WC  . citalopram  20 mg Oral Daily  . donepezil  5 mg Oral QHS  . enoxaparin (LOVENOX) injection  30 mg Subcutaneous Daily  . furosemide  40 mg Oral BID  . insulin aspart  0-5 Units Subcutaneous QHS  . insulin aspart  0-9 Units Subcutaneous TID WC  . levalbuterol  1.25 mg Nebulization TID  . pantoprazole  40 mg Oral Daily  . sacubitril-valsartan  1 tablet Oral BID  . sodium chloride flush  3 mL Intravenous Q12H

## 2015-11-16 NOTE — Care Management Obs Status (Signed)
[place] NOTIFICATION   Patient Details  Name: Mary Cook MRN: [id number] Date of Birth: [birth year]   Medicare Observation Status Notification Given:  Yes    Dawayne Patricia, RN 11/16/2015, 10:59 AM

## 2015-11-17 ENCOUNTER — Observation Stay (HOSPITAL_COMMUNITY): Payer: Commercial Managed Care - HMO

## 2015-11-17 DIAGNOSIS — J811 Chronic pulmonary edema: Secondary | ICD-10-CM | POA: Diagnosis not present

## 2015-11-17 DIAGNOSIS — I5043 Acute on chronic combined systolic (congestive) and diastolic (congestive) heart failure: Secondary | ICD-10-CM | POA: Diagnosis not present

## 2015-11-17 DIAGNOSIS — I509 Heart failure, unspecified: Secondary | ICD-10-CM | POA: Diagnosis not present

## 2015-11-17 DIAGNOSIS — I5023 Acute on chronic systolic (congestive) heart failure: Secondary | ICD-10-CM

## 2015-11-17 LAB — GLUCOSE, CAPILLARY
GLUCOSE-CAPILLARY: 163 mg/dL — AB (ref 65–99)
Glucose-Capillary: 181 mg/dL — ABNORMAL HIGH (ref 65–99)

## 2015-11-17 LAB — BASIC METABOLIC PANEL
Anion gap: 10 (ref 5–15)
BUN: 26 mg/dL — AB (ref 6–20)
CALCIUM: 8.7 mg/dL — AB (ref 8.9–10.3)
CO2: 26 mmol/L (ref 22–32)
CREATININE: 1.46 mg/dL — AB (ref 0.44–1.00)
Chloride: 102 mmol/L (ref 101–111)
GFR calc non Af Amer: 31 mL/min — ABNORMAL LOW (ref >60)
GFR, EST AFRICAN AMERICAN: 36 mL/min — AB (ref >60)
Glucose, Bld: 179 mg/dL — ABNORMAL HIGH (ref 65–99)
Potassium: 3.4 mmol/L — ABNORMAL LOW (ref 3.5–5.1)
Sodium: 138 mmol/L (ref 135–145)

## 2015-11-17 LAB — BRAIN NATRIURETIC PEPTIDE: B NATRIURETIC PEPTIDE 5: 196.9 pg/mL — AB (ref 0.0–100.0)

## 2015-11-17 MED ORDER — CARVEDILOL 12.5 MG PO TABS: 6.2500 mg | ORAL_TABLET | Freq: Two times a day (BID) | ORAL | Status: DC

## 2015-11-17 NOTE — Care Management Note (Signed)
Case Management Note Marvetta Gibbons RN, BSN Unit 2W-Case Manager [phone]  Patient Details  Name: Mary Cook MRN: [id number] Date of Birth: 06-15-1926  Subjective/Objective:  Pt admitted with acute on Chronic HF                  Action/Plan: PTA pt lived at home with spouse- uses cane, but also has RW and W/C at home if needed. Pt was recently at [facility] and referral was made to outpt rehab for PT/OT-  evals for PT/OT are pending here- CM to follow for d/c needs  Expected Discharge Date:    11/17/15              Expected Discharge Plan:  [place]  In-House Referral:     Discharge planning Services  CM Consult  Post Acute Care Choice:  Home Health Choice offered to:  Patient, Spouse  DME Arranged:    DME Agency:     HH Arranged:  RN, PT [place] Agency:  [place]  Status of Service:  Completed, signed off  Medicare Important Message Given:    Date Medicare IM Given:    Medicare IM give by:    Date Additional Medicare IM Given:    Additional Medicare Important Message give by:     If discussed at [place] of Stay Meetings, dates discussed:    Discharge Disposition: home/home health   Additional Comments:  11/17/15- [place] RN BSN- pt for d/c home today- has [place] orders placed for RN/PT- in to speak with pt and spouse at bedside-choice offered for [facility] agency in [facility]- list provided - per pt choice she would like to use [facility] for services- referral called to [facility] for HH-RN/PT- no other CM needs noted- pt to return home with spouse  Dawayne Patricia, RN 11/17/2015, 11:42 AM

## 2015-11-17 NOTE — Progress Notes (Signed)
Physical Therapy Treatment Patient Details Name: Mary Cook MRN: [id number] DOB: 07/31/1926 Today's Date: 11/17/2015    History of Present Illness Patient is a 82 y/o female with of HTN, OA, DM, systolic and diastolic congestive heart failure with EF of 30-35 percent with grade 1 diastolic dysfunction, CKD, CAD, HLD, GIB, OSA, cerebral aneurysm, CVA, CHF, T12 comp fx presents with SOB and CP. Of note, hospitalized from 3/5-3/7 because of atypical chest pain. Found to have BNP 515.6, troponin 0.06. CXR shows mild cardiomegaly with possible mild congestive changes and a small left pleural effusion    PT Comments    Patient progressing well towards PT goals. Improved endurance and ambulation distance today. Balance improved with use of RW vs SPC. Pt with bil knee instability and increased knee flexion/partial buckling towards end of ambulation bout. Discussed importance of using RW instead of cane and resting as needed. Will continue to follow.   Follow Up Recommendations  Home health PT;Supervision/Assistance - 24 hour     Equipment Recommendations  None recommended by PT    Recommendations for Other Services       Precautions / Restrictions Precautions Precautions: Fall Restrictions Weight Bearing Restrictions: No    Mobility  Bed Mobility Overal bed mobility: Modified Independent             General bed mobility comments: Use of rail and HOB elevated.  Transfers Overall transfer level: Needs assistance Equipment used: Rolling walker (2 wheeled);Straight cane Transfers: Sit to/from Stand Sit to Stand: Min guard         General transfer comment: Min guard for safety. Stood from EOB x3. transferred to chair post ambulation bout.  Ambulation/Gait Ambulation/Gait assistance: Min guard Ambulation Distance (Feet): 100 Feet (+ 30') Assistive device: Rolling walker (2 wheeled);Straight cane Gait Pattern/deviations: Step-to pattern;Step-through pattern;Decreased  stride length;Trunk flexed Gait velocity: decreased   General Gait Details: Ambulated with RW and with SPC. Balance improved with RW. Furniture walking with SPC using BUE support. 1/4 DOE. HR stable. Bil knee instability towards end of ambulation.   Stairs            Wheelchair Mobility    Modified Rankin (Stroke Patients Only)       Balance Overall balance assessment: Needs assistance Sitting-balance support: Feet supported;No upper extremity supported Sitting balance-Leahy Scale: Good     Standing balance support: During functional activity Standing balance-Leahy Scale: Fair                      Cognition Arousal/Alertness: Awake/alert Behavior During Therapy: WFL for tasks assessed/performed Overall Cognitive Status: History of cognitive impairments - at baseline       Memory: Decreased short-term memory              Exercises      General Comments General comments (skin integrity, edema, etc.): Spouse present during session.      Pertinent Vitals/Pain Pain Assessment: No/denies pain    Home Living                      Prior Function            PT Goals (current goals can now be found in the care plan section) Progress towards PT goals: Progressing toward goals    Frequency  Min 3X/week    PT Plan Current plan remains appropriate    Co-evaluation             End of Session  Equipment Utilized During Treatment: Gait belt Activity Tolerance: Patient tolerated treatment well;Patient limited by fatigue Patient left: in chair;with call bell/phone within reach;with family/visitor present     Time: 0909-0929 PT Time Calculation (min) (ACUTE ONLY): 20 min  Charges:  $Gait Training: 8-22 mins                    G Codes:  Functional Assessment Tool Used: clinical judgment   Djuna Frechette A Georgie Eduardo 11/17/2015, 9:41 AM Wray Kearns, [place], DPT [phone]

## 2015-11-17 NOTE — Discharge Instructions (Signed)
Follow with Walker Kehr, MD in 5-7 days  Please get a complete blood count and chemistry panel checked by your Primary MD at your next visit, and again as instructed by your Primary MD. Please get your medications reviewed and adjusted by your Primary MD.  Please request your Primary MD to go over all Hospital Tests and Procedure/Radiological results at the follow up, please get all Hospital records sent to your Prim MD by signing hospital release before you go home.  If you had Pneumonia of Lung problems at the Hospital: Please get a 2 view Chest X ray done in 6-8 weeks after hospital discharge or sooner if instructed by your Primary MD.  If you have Congestive Heart Failure: Please call your Cardiologist or Primary MD anytime you have any of the following symptoms:  1) 3 pound weight gain in 24 hours or 5 pounds in 1 week  2) shortness of breath, with or without a dry hacking cough  3) swelling in the hands, feet or stomach  4) if you have to sleep on extra pillows at night in order to breathe  Follow cardiac low salt diet and 1.5 lit/day fluid restriction.  If you have diabetes Accuchecks 4 times/day, Once in AM empty stomach and then before each meal. Log in all results and show them to your primary doctor at your next visit. If any glucose reading is under 80 or above 300 call your primary MD immediately.  If you have Seizure/Convulsions/Epilepsy: Please do not drive, operate heavy machinery, participate in activities at heights or participate in high speed sports until you have seen by Primary MD or a Neurologist and advised to do so again.  If you had Gastrointestinal Bleeding: Please ask your Primary MD to check a complete blood count within one week of discharge or at your next visit. Your endoscopic/colonoscopic biopsies that are pending at the time of discharge, will also need to followed by your Primary MD.  Get Medicines reviewed and adjusted. Please take all your  medications with you for your next visit with your Primary MD  Please request your Primary MD to go over all hospital tests and procedure/radiological results at the follow up, please ask your Primary MD to get all Hospital records sent to his/her office.  If you experience worsening of your admission symptoms, develop shortness of breath, life threatening emergency, suicidal or homicidal thoughts you must seek medical attention immediately by calling 911 or calling your MD immediately  if symptoms less severe.  You must read complete instructions/literature along with all the possible adverse reactions/side effects for all the Medicines you take and that have been prescribed to you. Take any new Medicines after you have completely understood and accpet all the possible adverse reactions/side effects.   Do not drive or operate heavy machinery when taking Pain medications.   Do not take more than prescribed Pain, Sleep and Anxiety Medications  Special Instructions: If you have smoked or chewed Tobacco  in the last 2 yrs please stop smoking, stop any regular Alcohol  and or any Recreational drug use.  Wear Seat belts while driving.  Please note You were cared for by a hospitalist during your hospital stay. If you have any questions about your discharge medications or the care you received while you were in the hospital after you are discharged, you can call the unit and asked to speak with the hospitalist on call if the hospitalist that took care of you is not available. Once  you are discharged, your primary care physician will handle any further medical issues. Please note that NO REFILLS for any discharge medications will be authorized once you are discharged, as it is imperative that you return to your primary care physician (or establish a relationship with a primary care physician if you do not have one) for your aftercare needs so that they can reassess your need for medications and monitor your  lab values.  You can reach the hospitalist office at phone [phone] or fax [phone]   If you do not have a primary care physician, you can call [phone] for a physician referral.  Activity: As tolerated with Full fall precautions use walker/cane & assistance as needed  Diet: low salt, heart healthy  Disposition Home

## 2015-11-17 NOTE — Progress Notes (Signed)
Patient Name:  Mary Cook, DOB: [birth year], MRN: [id number] Primary Doctor: Walker Kehr, MD Primary Cardiologist:   Date: 11/17/2015   SUBJECTIVE:   The patient seems to be clinically improved.   Past Medical History  Diagnosis Date  . Essential hypertension   . Osteoarthritis   . Osteopenia   . Diverticulosis of colon   . Type II diabetes mellitus ([place])   . CAD (coronary artery disease)     a. 04/2010 MI DES x 2 to LCX;  b. 05/2014 Cath: EF 35%, patent LCX stents w/ 70% stenosis in small AV groove LCX, Diag 50-60, RCA 50-60p->Med Rx.  Marland Kitchen Hyperlipidemia   . GIB (gastrointestinal bleeding)     a. 2002 Diverticular bleed.  . Skin disorder   . Visual disorder   . Thrombus   . Sleep apnea     cpap therapy  . Frequent urination   . Cerebral aneurysm   . Stroke ([place]) 2002  . Chronic combined systolic (congestive) and diastolic (congestive) heart failure ([place])     a. 01/2015 Echo: EF 30-35%, sev inflat HK, Gr 1DD, mild AI.  . Ischemic cardiomyopathy     a. 01/2015 Echo: EF 30-35%.  . T12 compression fracture ([place])     a. 04/2015.   Filed Vitals:   11/16/15 1324 11/16/15 2112 11/16/15 2131 11/17/15 0313  BP:   142/65 133/73  Pulse:   67 79  Temp:   97.7 F (36.5 C) 97.4 F (36.3 C)  TempSrc:   Oral Oral  Resp:   18 16  Height:      Weight:    168 lb (76.204 kg)  SpO2: 96% 96% 98% 95%    Intake/Output Summary (Last 24 hours) at 11/17/15 1121 Last data filed at 11/16/15 2133  Gross per 24 hour  Intake    480 ml  Output    700 ml  Net   -220 ml   Filed Weights   11/15/15 1431 11/16/15 0440 11/17/15 0313  Weight: 169 lb 8.5 oz (76.9 kg) 169 lb 8.5 oz (76.9 kg) 168 lb (76.204 kg)     LABS: Basic Metabolic Panel:  Recent Labs  11/15/15 0425 11/16/15 0240 11/17/15 0350  NA  --  137 138  K  --  3.4* 3.4*  CL  --  101 102  CO2  --  27 26  GLUCOSE  --  196* 179*  BUN  --  23* 26*  CREATININE  --  1.33* 1.46*  CALCIUM  --  8.6* 8.7*  MG 1.8   --   --    Liver Function Tests:  Recent Labs  11/14/15 2300  AST 27  ALT 21  ALKPHOS 82  BILITOT 0.5  PROT 7.1  ALBUMIN 3.3*   No results for input(s): LIPASE, AMYLASE in the last 72 hours. CBC:  Recent Labs  11/14/15 2300 11/16/15 0240  WBC 11.2* 6.5  NEUTROABS 8.6*  --   HGB 12.2 11.3*  HCT 37.1 34.8*  MCV 91.4 89.9  PLT 186 167   Cardiac Enzymes:  Recent Labs  11/15/15 0228 11/15/15 0845 11/15/15 1555  TROPONINI 0.06* 0.11* 0.12*   BNP: Invalid input(s): POCBNP D-Dimer: No results for input(s): DDIMER in the last 72 hours. Thyroid Function Tests: No results for input(s): TSH, T4TOTAL, T3FREE, THYROIDAB in the last 72 hours.  Invalid input(s): FREET3  RADIOLOGY: Dg Chest 2 View  11/17/2015  CLINICAL DATA:  Followup CHF. EXAM: CHEST  2 VIEW  COMPARISON:  11/14/2015 and earlier, including CTA chest 10/29/2015. FINDINGS: Cardiac silhouette normal in size, unchanged. Interval resolution of interstitial pulmonary edema. Mild hyperinflation. Lungs now clear. Mildly prominent bronchovascular markings diffusely and mild peribronchial thickening, unchanged from baseline. No pleural effusions. Degenerative changes involving the thoracic spine. IMPRESSION: Resolution of interstitial pulmonary edema. No acute cardiopulmonary disease currently. Electronically Signed   By: Evangeline Dakin M.D.   On: 11/17/2015 08:03   Dg Chest 2 View  10/29/2015  CLINICAL DATA:  Left chest pain and orthopnea. Coronary artery disease and ischemic cardiomyopathy. EXAM: CHEST  2 VIEW COMPARISON:  05/24/2015 FINDINGS: Heart size remains at the upper limits of normal. Both lungs are clear. No evidence of pneumothorax or pleural effusion. IMPRESSION: Stable exam.  No active disease. Electronically Signed   By: Earle Gell M.D.   On: 10/29/2015 19:47   Dg Ribs Unilateral Left  10/30/2015  CLINICAL DATA:  Recent fall, pain right posterior ribs around T8 level EXAM: LEFT RIBS - 2 VIEW COMPARISON:   None. FINDINGS: Hairline fracture posterior lateral right 8th rib. No hemo- or pneumothorax on the right. IMPRESSION: Nondisplaced rib fracture. Electronically Signed   By: Skipper Cliche M.D.   On: 10/30/2015 13:41   Dg Thoracic Spine 4v  10/30/2015  CLINICAL DATA:  Upper back pain after recent fall. EXAM: THORACIC SPINE - 4+ VIEW COMPARISON:  None. FINDINGS: No fracture or spondylolisthesis is noted. Disc spaces are well-maintained. Mild osteophyte formation is noted inferiorly. IMPRESSION: No acute abnormality seen in the thoracic spine. Electronically Signed   By: Marijo Conception, M.D.   On: 10/30/2015 13:36   Ct Angio Chest Pe W/cm &/or Wo Cm  10/29/2015  CLINICAL DATA:  82 year old female with left-sided chest and back pain. Symptom onset last night. EXAM: CT ANGIOGRAPHY CHEST WITH CONTRAST TECHNIQUE: Multidetector CT imaging of the chest was performed using the standard protocol during bolus administration of intravenous contrast. Multiplanar CT image reconstructions and MIPs were obtained to evaluate the vascular anatomy. CONTRAST:  78mL OMNIPAQUE IOHEXOL 350 MG/ML SOLN COMPARISON:  Radiographs earlier this day. Ventilation perfusion scan 01/31/2015, chest CT 08/17/2011 FINDINGS: There are no filling defects within the pulmonary arteries to suggest pulmonary embolus. Atherosclerosis of the thoracic aorta without aneurysm or dissection. There is distal aortic tortuosity. Coronary artery calcifications. Multi chamber cardiomegaly. No pericardial effusion. No mediastinal or hilar adenopathy. No pleural effusion. No consolidation to suggest pneumonia. Heterogeneous attenuation in the lower lobes and lingula, may reflect small airways disease versus pulmonary edema. There is minimal smooth septal thickening. No pulmonary mass or suspicious nodule. Hypodense left thyroid nodule measures 1.4 cm. Esophagus is patulous. Small hiatal hernia. No acute abnormality in the included upper abdomen. There are no acute  or suspicious osseous abnormalities. Review of the MIP images confirms the above findings. IMPRESSION: 1. No pulmonary embolus. 2. Multi chamber cardiomegaly. Minimal heterogeneous attenuation of the lower lobes is no septal thickening, can be seen with small airways disease versus mild pulmonary edema. 3. Atherosclerosis of thoracic aorta. Coronary artery calcifications. Electronically Signed   By: Jeb Levering M.D.   On: 10/29/2015 21:02   Nm Myocar Multi W/spect W/wall Motion / Ef  10/30/2015   There was no ST segment deviation noted during stress.  Findings consistent with prior myocardial infarction with peri-infarct ischemia in the inferolateral wall, this is consistent with her known LCX infarct and similar finding to her prior nuclear stress 06/07/2014  This is a high risk study. High risk due to  low ejection fraction. There is large infarct with fairly mild amount of myocardium currently at jeopardy.  The left ventricular ejection fraction is severely decreased (<30%).    Dg Chest Port 1 View  11/14/2015  CLINICAL DATA:  82 year old female with shortness of breath EXAM: PORTABLE CHEST 1 VIEW COMPARISON:  Chest CT dated 10/29/2015 FINDINGS: There is mild cardiomegaly with mild increased interstitial prominence which may represent a degree of congestive changes or edema. A small left pleural effusion is noted. There is no pneumothorax. The lungs are otherwise clear. No focal consolidation. There is degenerative changes of the spine and shoulders. No acute osseous pathology. IMPRESSION: Mild cardiomegaly with possible mild congestive changes and a small left pleural effusion. Electronically Signed   By: Anner Crete M.D.   On: 11/14/2015 22:27    PHYSICAL EXAM   the patient is stable. Her answers seem appropriate. Her husband and another family member or in the room. Lungs reveal a few scattered rhonchi. Cardiac exam reveals S1 and S2. The abdomen is soft. There is no peripheral  edema.   TELEMETRY: I have reviewed telemetry today November 17, 2015. There is normal sinus rhythm. There is an 11 beat run of supraventricular tachycardia.   ASSESSMENT AND PLAN:    Diabetes mellitus ([place])   Essential hypertension   CAD- Ant MI, CFX DES 9/11, no ISR or progression at cath 2012   Sleep apnea, cpap had been stopped secondary to "sinus issues"   GERD (gastroesophageal reflux disease)   Chest pain   Alzheimer's disease   Chronic kidney disease stage III   LBP (low back pain)   Dyspnea   Acute respiratory failure with hypoxia ([facility])   CHF exacerbation ([facility])    Acute on chronic systolic CHF (congestive heart failure) ([place])     The patient is stable. I've had a careful discussion with her husband and another family member. We discussed careful approach to limiting salt. I also explained that they need to limit her total fluid intake including water. They appear to understand at this point. Her echo this admission does show worsening of LV function and an ejection fraction of 25% range. No further workup is indicated at this time. She is ready for discharge home. Our team will make the early post hospital CHF office visit arrangements.   Dola Argyle 11/17/2015 11:21 AM

## 2015-11-17 NOTE — Progress Notes (Signed)
Occupational Therapy Treatment Patient Details Name: Mary Cook MRN: [id number] DOB: [birth year] Today's Date: 11/17/2015    History of present illness Patient is a 82 y/o female with of HTN, OA, DM, systolic and diastolic congestive heart failure with EF of 30-35 percent with grade 1 diastolic dysfunction, CKD, CAD, HLD, GIB, OSA, cerebral aneurysm, CVA, CHF, T12 comp fx presents with SOB and CP. Of note, hospitalized from 3/5-3/7 because of atypical chest pain. Found to have BNP 515.6, troponin 0.06. CXR shows mild cardiomegaly with possible mild congestive changes and a small left pleural effusion   OT comments  Pt feeling better today. Improved activity tolerance and requiring less assistance with mobility and LB ADL. Instructed in importance of using RW upon return home for fall prevention and energy conservation.  Follow Up Recommendations  Home health OT;Supervision/Assistance - 24 hour    Equipment Recommendations  None recommended by OT    Recommendations for Other Services      Precautions / Restrictions Precautions Precautions: Fall       Mobility Bed Mobility Overal bed mobility: Modified Independent             General bed mobility comments: Use of rail and HOB elevated.  Transfers Overall transfer level: Needs assistance Equipment used: Rolling walker (2 wheeled)   Sit to Stand: Supervision         General transfer comment: supervision for safety from EOB and toilet    Balance     Sitting balance-Leahy Scale: Good       Standing balance-Leahy Scale: Fair                     ADL Overall ADL's : Needs assistance/impaired     Grooming: Wash/dry hands;Brushing hair;Oral care;Standing;Supervision/safety           Upper Body Dressing : Set up;Sitting   Lower Body Dressing: Supervision/safety;Sit to/from stand   Toilet Transfer: Supervision/safety;Ambulation;RW;Comfort height toilet   Toileting- Clothing Manipulation and  Hygiene: Supervision/safety;Sit to/from stand         General ADL Comments: improved activity tolerance from yesterday, instructed in energy conservation strategies during ADL, IADL and mobility      Vision                     Perception     Praxis      Cognition   Behavior During Therapy: [facility] for tasks assessed/performed Overall Cognitive Status: History of cognitive impairments - at baseline       Memory: Decreased short-term memory               Extremity/Trunk Assessment               Exercises     Shoulder Instructions       General Comments      Pertinent Vitals/ Pain       Pain Assessment: No/denies pain  Home Living                                          Prior Functioning/Environment              Frequency Min 2X/week     Progress Toward Goals  OT Goals(current goals can now be found in the care plan section)  Progress towards OT goals: Progressing toward goals     Plan Discharge plan remains  appropriate    Co-evaluation                 End of Session Equipment Utilized During Treatment: Gait belt;Rolling walker   Activity Tolerance Patient tolerated treatment well   Patient Left in bed;with call bell/phone within reach   Nurse Communication          Time: 1211-1230 OT Time Calculation (min): 19 min  Charges: OT General Charges $OT Visit: 1 Procedure OT Treatments $Self Care/Home Management : 8-22 mins  Malka So 11/17/2015, 1:43 PM  [phone]

## 2015-11-17 NOTE — Discharge Summary (Signed)
Physician Discharge Summary  Mary Cook [id number] DOB: 06/05/1926 DOA: 11/14/2015  PCP: Walker Kehr, MD  Admit date: 11/14/2015 Discharge date: 11/17/2015  Time spent: > 30 minutes  Recommendations for Outpatient Follow-up:  1. Follow up with cardiology post hospital stay. They will arrange   Discharge Diagnoses:  Active Problems:   Diabetes mellitus ([place])   Essential hypertension   CAD- Ant MI, CFX DES 9/11, no ISR or progression at cath 2012   Sleep apnea, cpap had been stopped secondary to "sinus issues"   GERD (gastroesophageal reflux disease)   Chest pain   Alzheimer's disease   Chronic kidney disease stage III   LBP (low back pain)   Dyspnea   Acute respiratory failure with hypoxia (HCC)   CHF exacerbation (HCC)   Acute on chronic systolic CHF (congestive heart failure) ([place])  Discharge Condition: stable  Diet recommendation: low salt, heart healthy  Filed Weights   11/15/15 1431 11/16/15 0440 11/17/15 0313  Weight: 76.9 kg (169 lb 8.5 oz) 76.9 kg (169 lb 8.5 oz) 76.204 kg (168 lb)   History of present illness:  See H&P, Labs, Consult and Test reports for all details in brief, patient is a 82 y.o. female with PMH of combat his systolic and diastolic congestive heart failure with EF of 30-35 percent with grade 1 diastolic dysfunction, hypertension, hyperlipidemia, diabetes mellitus, GERD, depression, CAD, s/p of stent placement 2011, GI bleeding, OSA not on CPAP, stroke, cerebral aneurysm, chronic kidney disease-stage III, who presents with shortness breath and chest pain.  Hospital Course Acute on chronic combined systolic and diastolic heart failure with hypoxic respiratory failure due to pulmonary edema Patient's shortness of breath, elevated BNP and leg edema are consistent with CHF exacerbation. 2-D echo on 01/31/15 showed EF 30 - 35 % with grade 1 diastolic dysfunction. Cardiology was consulted and have followed patient while hospitalized. She was  diuresed with IV lasix and converted to po as she had mild bump in Cr. She was net negative 3L and weight decreased with 5 lbs. Her breathing has improved significantly. Repeat CXR showed improvement in her pulmonary edema. She was able to be weaned off to room air.  Chest pain and CAD - patient with history of recurrent chest pain, just hospitalized with same 2 weeks ago, likely demand ischemia, troponin flat DM-II - Last A1c 8.3 on 10/29/15, poorly controled. Patient is taking metformin at home GERD - Protonix Hypertension - On Lasix and Entresto Hx of stroke - Continue aspirin Alzheimer's disease -Continue donepezil Chronic kidney disease stage III - baseline creatinine 1.1-1.5, close to baseline Depression - Continue home medications: Celexa LBP (low back pain) - Flexeril  Procedures:  None    Consultations:  Cardiology   Discharge Exam: Filed Vitals:   11/16/15 1324 11/16/15 2112 11/16/15 2131 11/17/15 0313  BP:   142/65 133/73  Pulse:   67 79  Temp:   97.7 F (36.5 C) 97.4 F (36.3 C)  TempSrc:   Oral Oral  Resp:   18 16  Height:      Weight:    76.204 kg (168 lb)  SpO2: 96% 96% 98% 95%    General: NAD Cardiovascular: RRR Respiratory: CTA biL  Discharge Instructions Activity:  As tolerated   Get Medicines reviewed and adjusted: Please take all your medications with you for your next visit with your Primary MD  Please request your Primary MD to go over all hospital tests and procedure/radiological results at the follow up, please ask  your Primary MD to get all Hospital records sent to his/her office.  If you experience worsening of your admission symptoms, develop shortness of breath, life threatening emergency, suicidal or homicidal thoughts you must seek medical attention immediately by calling 911 or calling your MD immediately if symptoms less severe.  You must read complete instructions/literature along with all the possible adverse reactions/side effects  for all the Medicines you take and that have been prescribed to you. Take any new Medicines after you have completely understood and accpet all the possible adverse reactions/side effects.   Do not drive when taking Pain medications.   Do not take more than prescribed Pain, Sleep and Anxiety Medications  Special Instructions: If you have smoked or chewed Tobacco in the last 2 yrs please stop smoking, stop any regular Alcohol and or any Recreational drug use.  Wear Seat belts while driving.  Please note  You were cared for by a hospitalist during your hospital stay. Once you are discharged, your primary care physician will handle any further medical issues. Please note that NO REFILLS for any discharge medications will be authorized once you are discharged, as it is imperative that you return to your primary care physician (or establish a relationship with a primary care physician if you do not have one) for your aftercare needs so that they can reassess your need for medications and monitor your lab values.    Medication List    TAKE these medications        acetaminophen 325 MG tablet  Commonly known as:  TYLENOL  Take 2 tablets (650 mg total) by mouth every 4 (four) hours as needed for headache or mild pain.     aspirin 81 MG EC tablet  Take 81 mg by mouth daily.     citalopram 20 MG tablet  Commonly known as:  CELEXA  Take 1 tablet (20 mg total) by mouth daily.     donepezil 5 MG tablet  Commonly known as:  ARICEPT  Take 1 tablet (5 mg total) by mouth at bedtime.     furosemide 40 MG tablet  Commonly known as:  LASIX  Take 1 tablet (40 mg total) by mouth 2 (two) times daily.     isosorbide mononitrate 60 MG 24 hr tablet  Commonly known as:  IMDUR  Take 60 mg by mouth daily.     metFORMIN 500 MG tablet  Commonly known as:  GLUCOPHAGE  TAKE 1 TABLET TWICE DAILY BEFORE A MEAL     omeprazole 20 MG capsule  Commonly known as:  PRILOSEC  Take 20 mg by mouth daily.      potassium chloride SA 20 MEQ tablet  Commonly known as:  K-DUR,KLOR-CON  Take 1 tablet (20 mEq total) by mouth daily.     sacubitril-valsartan 49-51 MG  Commonly known as:  ENTRESTO  Take 1 tablet by mouth 2 (two) times daily.     UNABLE TO FIND  Outpatient PHYSICAL THERAPY  Diagnosis: TIA (transient ischemic attack)      ASK your doctor about these medications        carvedilol 6.25 MG tablet  Commonly known as:  COREG  Take 6.25 mg by mouth 2 (two) times daily.  Ask about: Which instructions should I use?     carvedilol 12.5 MG tablet  Commonly known as:  COREG  Take 0.5 tablets (6.25 mg total) by mouth 2 (two) times daily.  Ask about: Which instructions should I use?  traMADol 50 MG tablet  Commonly known as:  ULTRAM  Take 50 mg by mouth every 8 (eight) hours as needed for moderate pain or severe pain.           Follow-up Information    Follow up with Walker Kehr, MD.   Specialty:  Internal Medicine   Why:  As needed   Contact information:   [place] [address] [phone]       Follow up with [facility].   Why:  HH-RN/PT arranged- they will call you to schedule visits at home   Contact information:   [address] [place] [address] [phone]       Follow up with Tarri Fuller, PA-C On 11/28/2015.   Specialties:  Physician Assistant, Radiology, Interventional Cardiology   Why:  Cardiology Hospital Follow-Up on 11/28/2015 at 11:30AM.   Contact information:   [address] [address] [phone]       The results of significant diagnostics from this hospitalization (including imaging, microbiology, ancillary and laboratory) are listed below for reference.    Significant Diagnostic Studies: Dg Chest 2 View  11/17/2015  CLINICAL DATA:  Followup CHF. EXAM: CHEST  2 VIEW COMPARISON:  11/14/2015 and earlier, including CTA chest 10/29/2015. FINDINGS: Cardiac silhouette normal in size,  unchanged. Interval resolution of interstitial pulmonary edema. Mild hyperinflation. Lungs now clear. Mildly prominent bronchovascular markings diffusely and mild peribronchial thickening, unchanged from baseline. No pleural effusions. Degenerative changes involving the thoracic spine. IMPRESSION: Resolution of interstitial pulmonary edema. No acute cardiopulmonary disease currently. Electronically Signed   By: Evangeline Dakin M.D.   On: 11/17/2015 08:03   Dg Chest 2 View  10/29/2015  CLINICAL DATA:  Left chest pain and orthopnea. Coronary artery disease and ischemic cardiomyopathy. EXAM: CHEST  2 VIEW COMPARISON:  05/24/2015 FINDINGS: Heart size remains at the upper limits of normal. Both lungs are clear. No evidence of pneumothorax or pleural effusion. IMPRESSION: Stable exam.  No active disease. Electronically Signed   By: Earle Gell M.D.   On: 10/29/2015 19:47   Dg Ribs Unilateral Left  10/30/2015  CLINICAL DATA:  Recent fall, pain right posterior ribs around T8 level EXAM: LEFT RIBS - 2 VIEW COMPARISON:  None. FINDINGS: Hairline fracture posterior lateral right 8th rib. No hemo- or pneumothorax on the right. IMPRESSION: Nondisplaced rib fracture. Electronically Signed   By: Skipper Cliche M.D.   On: 10/30/2015 13:41   Dg Thoracic Spine 4v  10/30/2015  CLINICAL DATA:  Upper back pain after recent fall. EXAM: THORACIC SPINE - 4+ VIEW COMPARISON:  None. FINDINGS: No fracture or spondylolisthesis is noted. Disc spaces are well-maintained. Mild osteophyte formation is noted inferiorly. IMPRESSION: No acute abnormality seen in the thoracic spine. Electronically Signed   By: Marijo Conception, M.D.   On: 10/30/2015 13:36   Ct Angio Chest Pe W/cm &/or Wo Cm  10/29/2015  CLINICAL DATA:  82 year old female with left-sided chest and back pain. Symptom onset last night. EXAM: CT ANGIOGRAPHY CHEST WITH CONTRAST TECHNIQUE: Multidetector CT imaging of the chest was performed using the standard protocol during bolus  administration of intravenous contrast. Multiplanar CT image reconstructions and MIPs were obtained to evaluate the vascular anatomy. CONTRAST:  36mL OMNIPAQUE IOHEXOL 350 MG/ML SOLN COMPARISON:  Radiographs earlier this day. Ventilation perfusion scan 01/31/2015, chest CT 08/17/2011 FINDINGS: There are no filling defects within the pulmonary arteries to suggest pulmonary embolus. Atherosclerosis of the thoracic aorta without  aneurysm or dissection. There is distal aortic tortuosity. Coronary artery calcifications. Multi chamber cardiomegaly. No pericardial effusion. No mediastinal or hilar adenopathy. No pleural effusion. No consolidation to suggest pneumonia. Heterogeneous attenuation in the lower lobes and lingula, may reflect small airways disease versus pulmonary edema. There is minimal smooth septal thickening. No pulmonary mass or suspicious nodule. Hypodense left thyroid nodule measures 1.4 cm. Esophagus is patulous. Small hiatal hernia. No acute abnormality in the included upper abdomen. There are no acute or suspicious osseous abnormalities. Review of the MIP images confirms the above findings. IMPRESSION: 1. No pulmonary embolus. 2. Multi chamber cardiomegaly. Minimal heterogeneous attenuation of the lower lobes is no septal thickening, can be seen with small airways disease versus mild pulmonary edema. 3. Atherosclerosis of thoracic aorta. Coronary artery calcifications. Electronically Signed   By: Jeb Levering M.D.   On: 10/29/2015 21:02   Nm Myocar Multi W/spect W/wall Motion / Ef  10/30/2015   There was no ST segment deviation noted during stress.  Findings consistent with prior myocardial infarction with peri-infarct ischemia in the inferolateral wall, this is consistent with her known LCX infarct and similar finding to her prior nuclear stress 06/07/2014  This is a high risk study. High risk due to low ejection fraction. There is large infarct with fairly mild amount of myocardium currently  at jeopardy.  The left ventricular ejection fraction is severely decreased (<30%).    Dg Chest Port 1 View  11/14/2015  CLINICAL DATA:  82 year old female with shortness of breath EXAM: PORTABLE CHEST 1 VIEW COMPARISON:  Chest CT dated 10/29/2015 FINDINGS: There is mild cardiomegaly with mild increased interstitial prominence which may represent a degree of congestive changes or edema. A small left pleural effusion is noted. There is no pneumothorax. The lungs are otherwise clear. No focal consolidation. There is degenerative changes of the spine and shoulders. No acute osseous pathology. IMPRESSION: Mild cardiomegaly with possible mild congestive changes and a small left pleural effusion. Electronically Signed   By: Anner Crete M.D.   On: 11/14/2015 22:27   Microbiology: No results found for this or any previous visit (from the past 240 hour(s)).   Labs: Basic Metabolic Panel:  Recent Labs Lab 11/14/15 2300 11/15/15 0425 11/16/15 0240 11/17/15 0350  NA 136  --  137 138  K 3.4*  --  3.4* 3.4*  CL 102  --  101 102  CO2 25  --  27 26  GLUCOSE 240*  --  196* 179*  BUN 15  --  23* 26*  CREATININE 1.39*  --  1.33* 1.46*  CALCIUM 9.2  --  8.6* 8.7*  MG  --  1.8  --   --    Liver Function Tests:  Recent Labs Lab 11/14/15 2300  AST 27  ALT 21  ALKPHOS 82  BILITOT 0.5  PROT 7.1  ALBUMIN 3.3*   CBC:  Recent Labs Lab 11/14/15 2300 11/16/15 0240  WBC 11.2* 6.5  NEUTROABS 8.6*  --   HGB 12.2 11.3*  HCT 37.1 34.8*  MCV 91.4 89.9  PLT 186 167   Cardiac Enzymes:  Recent Labs Lab 11/15/15 0228 11/15/15 0845 11/15/15 1555  TROPONINI 0.06* 0.11* 0.12*   BNP: BNP (last 3 results)  Recent Labs  05/24/15 1018 11/14/15 2300 11/17/15 0350  BNP 424.0* 515.6* 196.9*   CBG:  Recent Labs Lab 11/16/15 1126 11/16/15 1642 11/16/15 2128 11/17/15 0607 11/17/15 1137  GLUCAP 180* 200* 146* 163* 181*    Signed:  GHERGHE,  COSTIN  [facility] 11/17/2015,  12:58 PM

## 2015-11-19 ENCOUNTER — Telehealth: Payer: Self-pay | Admitting: *Deleted

## 2015-11-19 DIAGNOSIS — I130 Hypertensive heart and chronic kidney disease with heart failure and stage 1 through stage 4 chronic kidney disease, or unspecified chronic kidney disease: Secondary | ICD-10-CM | POA: Diagnosis not present

## 2015-11-19 DIAGNOSIS — N183 Chronic kidney disease, stage 3 (moderate): Secondary | ICD-10-CM | POA: Diagnosis not present

## 2015-11-19 DIAGNOSIS — G4733 Obstructive sleep apnea (adult) (pediatric): Secondary | ICD-10-CM | POA: Diagnosis not present

## 2015-11-19 DIAGNOSIS — F0280 Dementia in other diseases classified elsewhere without behavioral disturbance: Secondary | ICD-10-CM | POA: Diagnosis not present

## 2015-11-19 DIAGNOSIS — E119 Type 2 diabetes mellitus without complications: Secondary | ICD-10-CM | POA: Diagnosis not present

## 2015-11-19 DIAGNOSIS — I255 Ischemic cardiomyopathy: Secondary | ICD-10-CM | POA: Diagnosis not present

## 2015-11-19 DIAGNOSIS — I2510 Atherosclerotic heart disease of native coronary artery without angina pectoris: Secondary | ICD-10-CM | POA: Diagnosis not present

## 2015-11-19 DIAGNOSIS — I5043 Acute on chronic combined systolic (congestive) and diastolic (congestive) heart failure: Secondary | ICD-10-CM | POA: Diagnosis not present

## 2015-11-19 DIAGNOSIS — G309 Alzheimer's disease, unspecified: Secondary | ICD-10-CM | POA: Diagnosis not present

## 2015-11-19 NOTE — ED Notes (Signed)
Contacted by [facility] RN stating that patient has lost her bag of medicines and requesting meds be called in to pharmacy.  Called in: Lasix 40mg  i tab PO BIC, #180, Glucophage 500mg  i tab POBID #180, Celexa 20mg  i tab PO QD #90, Aricept 5mg  i tab PO QHS #90, and KCl 9meq i tab PO QD #90, Dr. Tyrone Apple Plotnikov as prescribed at discharge.  Called to [place], [place], [place].  Advised to contact Dr. Shelva Majestic for additional cardiac meds indicated but not prescribed at discharge.  RN verbalized understanding.

## 2015-11-20 DIAGNOSIS — I255 Ischemic cardiomyopathy: Secondary | ICD-10-CM | POA: Diagnosis not present

## 2015-11-20 DIAGNOSIS — I5043 Acute on chronic combined systolic (congestive) and diastolic (congestive) heart failure: Secondary | ICD-10-CM | POA: Diagnosis not present

## 2015-11-20 DIAGNOSIS — E119 Type 2 diabetes mellitus without complications: Secondary | ICD-10-CM | POA: Diagnosis not present

## 2015-11-20 DIAGNOSIS — N183 Chronic kidney disease, stage 3 (moderate): Secondary | ICD-10-CM | POA: Diagnosis not present

## 2015-11-20 DIAGNOSIS — I2510 Atherosclerotic heart disease of native coronary artery without angina pectoris: Secondary | ICD-10-CM | POA: Diagnosis not present

## 2015-11-20 DIAGNOSIS — G4733 Obstructive sleep apnea (adult) (pediatric): Secondary | ICD-10-CM | POA: Diagnosis not present

## 2015-11-20 DIAGNOSIS — F0280 Dementia in other diseases classified elsewhere without behavioral disturbance: Secondary | ICD-10-CM | POA: Diagnosis not present

## 2015-11-20 DIAGNOSIS — I130 Hypertensive heart and chronic kidney disease with heart failure and stage 1 through stage 4 chronic kidney disease, or unspecified chronic kidney disease: Secondary | ICD-10-CM | POA: Diagnosis not present

## 2015-11-20 DIAGNOSIS — G309 Alzheimer's disease, unspecified: Secondary | ICD-10-CM | POA: Diagnosis not present

## 2015-11-22 ENCOUNTER — Telehealth: Payer: Self-pay | Admitting: *Deleted

## 2015-11-22 DIAGNOSIS — I5043 Acute on chronic combined systolic (congestive) and diastolic (congestive) heart failure: Secondary | ICD-10-CM | POA: Diagnosis not present

## 2015-11-22 DIAGNOSIS — G4733 Obstructive sleep apnea (adult) (pediatric): Secondary | ICD-10-CM | POA: Diagnosis not present

## 2015-11-22 DIAGNOSIS — I2510 Atherosclerotic heart disease of native coronary artery without angina pectoris: Secondary | ICD-10-CM | POA: Diagnosis not present

## 2015-11-22 DIAGNOSIS — G309 Alzheimer's disease, unspecified: Secondary | ICD-10-CM | POA: Diagnosis not present

## 2015-11-22 DIAGNOSIS — N183 Chronic kidney disease, stage 3 (moderate): Secondary | ICD-10-CM | POA: Diagnosis not present

## 2015-11-22 DIAGNOSIS — I130 Hypertensive heart and chronic kidney disease with heart failure and stage 1 through stage 4 chronic kidney disease, or unspecified chronic kidney disease: Secondary | ICD-10-CM | POA: Diagnosis not present

## 2015-11-22 DIAGNOSIS — E119 Type 2 diabetes mellitus without complications: Secondary | ICD-10-CM | POA: Diagnosis not present

## 2015-11-22 DIAGNOSIS — I255 Ischemic cardiomyopathy: Secondary | ICD-10-CM | POA: Diagnosis not present

## 2015-11-22 DIAGNOSIS — F0280 Dementia in other diseases classified elsewhere without behavioral disturbance: Secondary | ICD-10-CM | POA: Diagnosis not present

## 2015-11-22 NOTE — Telephone Encounter (Signed)
Caller states patient's BP was 180/80 today. She feels like it is because pt did not eat supper last night or breakfast this morning. She states her main reason for her call is to request a refill for Celexa.  Need to clarify which pharmacy this needs to be sent to. I called number above and was not available to get through. Will try again tomorrow or call pt.

## 2015-11-23 MED ORDER — CITALOPRAM HYDROBROMIDE 20 MG PO TABS: 20.0000 mg | ORAL_TABLET | Freq: Every day | ORAL | Status: DC

## 2015-11-23 NOTE — Telephone Encounter (Signed)
I called pt. No answer. Chart states call [name] David(friend). I called [name], She states refill needs to be sent to [facility] in [place]. Done. See meds.

## 2015-11-26 DIAGNOSIS — I2510 Atherosclerotic heart disease of native coronary artery without angina pectoris: Secondary | ICD-10-CM | POA: Diagnosis not present

## 2015-11-26 DIAGNOSIS — E119 Type 2 diabetes mellitus without complications: Secondary | ICD-10-CM | POA: Diagnosis not present

## 2015-11-26 DIAGNOSIS — I130 Hypertensive heart and chronic kidney disease with heart failure and stage 1 through stage 4 chronic kidney disease, or unspecified chronic kidney disease: Secondary | ICD-10-CM | POA: Diagnosis not present

## 2015-11-26 DIAGNOSIS — G4733 Obstructive sleep apnea (adult) (pediatric): Secondary | ICD-10-CM | POA: Diagnosis not present

## 2015-11-26 DIAGNOSIS — N183 Chronic kidney disease, stage 3 (moderate): Secondary | ICD-10-CM | POA: Diagnosis not present

## 2015-11-26 DIAGNOSIS — I255 Ischemic cardiomyopathy: Secondary | ICD-10-CM | POA: Diagnosis not present

## 2015-11-26 DIAGNOSIS — G309 Alzheimer's disease, unspecified: Secondary | ICD-10-CM | POA: Diagnosis not present

## 2015-11-26 DIAGNOSIS — F0280 Dementia in other diseases classified elsewhere without behavioral disturbance: Secondary | ICD-10-CM | POA: Diagnosis not present

## 2015-11-26 DIAGNOSIS — I5043 Acute on chronic combined systolic (congestive) and diastolic (congestive) heart failure: Secondary | ICD-10-CM | POA: Diagnosis not present

## 2015-11-28 ENCOUNTER — Ambulatory Visit: Payer: Commercial Managed Care - HMO | Admitting: Physician Assistant

## 2015-11-29 ENCOUNTER — Telehealth: Payer: Self-pay | Admitting: Cardiovascular Disease

## 2015-11-29 ENCOUNTER — Encounter: Payer: Self-pay | Admitting: *Deleted

## 2015-11-29 DIAGNOSIS — I255 Ischemic cardiomyopathy: Secondary | ICD-10-CM | POA: Diagnosis not present

## 2015-11-29 DIAGNOSIS — N183 Chronic kidney disease, stage 3 (moderate): Secondary | ICD-10-CM | POA: Diagnosis not present

## 2015-11-29 DIAGNOSIS — G4733 Obstructive sleep apnea (adult) (pediatric): Secondary | ICD-10-CM | POA: Diagnosis not present

## 2015-11-29 DIAGNOSIS — I2510 Atherosclerotic heart disease of native coronary artery without angina pectoris: Secondary | ICD-10-CM | POA: Diagnosis not present

## 2015-11-29 DIAGNOSIS — F0280 Dementia in other diseases classified elsewhere without behavioral disturbance: Secondary | ICD-10-CM | POA: Diagnosis not present

## 2015-11-29 DIAGNOSIS — E119 Type 2 diabetes mellitus without complications: Secondary | ICD-10-CM | POA: Diagnosis not present

## 2015-11-29 DIAGNOSIS — G309 Alzheimer's disease, unspecified: Secondary | ICD-10-CM | POA: Diagnosis not present

## 2015-11-29 DIAGNOSIS — I5043 Acute on chronic combined systolic (congestive) and diastolic (congestive) heart failure: Secondary | ICD-10-CM | POA: Diagnosis not present

## 2015-11-29 DIAGNOSIS — I130 Hypertensive heart and chronic kidney disease with heart failure and stage 1 through stage 4 chronic kidney disease, or unspecified chronic kidney disease: Secondary | ICD-10-CM | POA: Diagnosis not present

## 2015-11-29 MED ORDER — ISOSORBIDE MONONITRATE ER 60 MG PO TB24: 60.0000 mg | ORAL_TABLET | Freq: Every day | ORAL | Status: DC

## 2015-11-29 NOTE — Telephone Encounter (Signed)
Please call asap,she is at the pt's home.

## 2015-11-29 NOTE — Telephone Encounter (Signed)
Spoke to home health RN. She notes pt had 2 different doses of carvedilol & needed clarification on this. Noted she'd had 12.5mg  strength tablets to take 0.5 tablets BID, also an order for 6.25mg  tablets BID. Also pt out of isosorbide.  Clarified medications based on mg dosing, and isosorbide refill sent out to preferred pharmacy.  Pt missed hosp f/u yesterday due to caregiver/transporter being out of town. HHRN informs me family friend/caregiver [name] will be back in town tomorrow and that a hosp f/u should be arranged w/ her.  [name] can be reached at [phone].

## 2015-11-29 NOTE — Telephone Encounter (Signed)
New message    [facility] calling  - wants to know if Imdur & coreg is a current medication.

## 2015-12-04 DIAGNOSIS — I130 Hypertensive heart and chronic kidney disease with heart failure and stage 1 through stage 4 chronic kidney disease, or unspecified chronic kidney disease: Secondary | ICD-10-CM | POA: Diagnosis not present

## 2015-12-04 DIAGNOSIS — I255 Ischemic cardiomyopathy: Secondary | ICD-10-CM | POA: Diagnosis not present

## 2015-12-04 DIAGNOSIS — I5043 Acute on chronic combined systolic (congestive) and diastolic (congestive) heart failure: Secondary | ICD-10-CM | POA: Diagnosis not present

## 2015-12-04 DIAGNOSIS — G309 Alzheimer's disease, unspecified: Secondary | ICD-10-CM | POA: Diagnosis not present

## 2015-12-04 DIAGNOSIS — E119 Type 2 diabetes mellitus without complications: Secondary | ICD-10-CM | POA: Diagnosis not present

## 2015-12-04 DIAGNOSIS — G4733 Obstructive sleep apnea (adult) (pediatric): Secondary | ICD-10-CM | POA: Diagnosis not present

## 2015-12-04 DIAGNOSIS — I2510 Atherosclerotic heart disease of native coronary artery without angina pectoris: Secondary | ICD-10-CM | POA: Diagnosis not present

## 2015-12-04 DIAGNOSIS — F0280 Dementia in other diseases classified elsewhere without behavioral disturbance: Secondary | ICD-10-CM | POA: Diagnosis not present

## 2015-12-04 DIAGNOSIS — N183 Chronic kidney disease, stage 3 (moderate): Secondary | ICD-10-CM | POA: Diagnosis not present

## 2015-12-05 DIAGNOSIS — I2510 Atherosclerotic heart disease of native coronary artery without angina pectoris: Secondary | ICD-10-CM | POA: Diagnosis not present

## 2015-12-05 DIAGNOSIS — I255 Ischemic cardiomyopathy: Secondary | ICD-10-CM | POA: Diagnosis not present

## 2015-12-05 DIAGNOSIS — F0280 Dementia in other diseases classified elsewhere without behavioral disturbance: Secondary | ICD-10-CM | POA: Diagnosis not present

## 2015-12-05 DIAGNOSIS — N183 Chronic kidney disease, stage 3 (moderate): Secondary | ICD-10-CM | POA: Diagnosis not present

## 2015-12-05 DIAGNOSIS — I130 Hypertensive heart and chronic kidney disease with heart failure and stage 1 through stage 4 chronic kidney disease, or unspecified chronic kidney disease: Secondary | ICD-10-CM | POA: Diagnosis not present

## 2015-12-05 DIAGNOSIS — I5043 Acute on chronic combined systolic (congestive) and diastolic (congestive) heart failure: Secondary | ICD-10-CM | POA: Diagnosis not present

## 2015-12-05 DIAGNOSIS — G4733 Obstructive sleep apnea (adult) (pediatric): Secondary | ICD-10-CM | POA: Diagnosis not present

## 2015-12-05 DIAGNOSIS — G309 Alzheimer's disease, unspecified: Secondary | ICD-10-CM | POA: Diagnosis not present

## 2015-12-05 DIAGNOSIS — E119 Type 2 diabetes mellitus without complications: Secondary | ICD-10-CM | POA: Diagnosis not present

## 2015-12-06 ENCOUNTER — Encounter: Payer: Self-pay | Admitting: Internal Medicine

## 2015-12-06 ENCOUNTER — Telehealth: Payer: Self-pay | Admitting: Cardiovascular Disease

## 2015-12-06 ENCOUNTER — Telehealth: Payer: Self-pay

## 2015-12-06 ENCOUNTER — Other Ambulatory Visit (INDEPENDENT_AMBULATORY_CARE_PROVIDER_SITE_OTHER): Payer: Commercial Managed Care - HMO

## 2015-12-06 ENCOUNTER — Ambulatory Visit (INDEPENDENT_AMBULATORY_CARE_PROVIDER_SITE_OTHER): Payer: Commercial Managed Care - HMO | Admitting: Internal Medicine

## 2015-12-06 VITALS — BP 138/62 | HR 66 | Wt 172.0 lb

## 2015-12-06 DIAGNOSIS — I130 Hypertensive heart and chronic kidney disease with heart failure and stage 1 through stage 4 chronic kidney disease, or unspecified chronic kidney disease: Secondary | ICD-10-CM | POA: Diagnosis not present

## 2015-12-06 DIAGNOSIS — I5022 Chronic systolic (congestive) heart failure: Secondary | ICD-10-CM

## 2015-12-06 DIAGNOSIS — G301 Alzheimer's disease with late onset: Secondary | ICD-10-CM | POA: Diagnosis not present

## 2015-12-06 DIAGNOSIS — E538 Deficiency of other specified B group vitamins: Secondary | ICD-10-CM

## 2015-12-06 DIAGNOSIS — N183 Chronic kidney disease, stage 3 unspecified: Secondary | ICD-10-CM

## 2015-12-06 DIAGNOSIS — G4733 Obstructive sleep apnea (adult) (pediatric): Secondary | ICD-10-CM | POA: Diagnosis not present

## 2015-12-06 DIAGNOSIS — R296 Repeated falls: Secondary | ICD-10-CM

## 2015-12-06 DIAGNOSIS — I10 Essential (primary) hypertension: Secondary | ICD-10-CM

## 2015-12-06 DIAGNOSIS — I255 Ischemic cardiomyopathy: Secondary | ICD-10-CM | POA: Diagnosis not present

## 2015-12-06 DIAGNOSIS — G309 Alzheimer's disease, unspecified: Secondary | ICD-10-CM | POA: Diagnosis not present

## 2015-12-06 DIAGNOSIS — F0280 Dementia in other diseases classified elsewhere without behavioral disturbance: Secondary | ICD-10-CM | POA: Diagnosis not present

## 2015-12-06 DIAGNOSIS — I5043 Acute on chronic combined systolic (congestive) and diastolic (congestive) heart failure: Secondary | ICD-10-CM | POA: Diagnosis not present

## 2015-12-06 DIAGNOSIS — F0281 Dementia in other diseases classified elsewhere with behavioral disturbance: Secondary | ICD-10-CM

## 2015-12-06 DIAGNOSIS — E119 Type 2 diabetes mellitus without complications: Secondary | ICD-10-CM | POA: Diagnosis not present

## 2015-12-06 DIAGNOSIS — I2510 Atherosclerotic heart disease of native coronary artery without angina pectoris: Secondary | ICD-10-CM | POA: Diagnosis not present

## 2015-12-06 DIAGNOSIS — F02818 Dementia in other diseases classified elsewhere, unspecified severity, with other behavioral disturbance: Secondary | ICD-10-CM

## 2015-12-06 LAB — BASIC METABOLIC PANEL
BUN: 43 mg/dL — ABNORMAL HIGH (ref 6–23)
CO2: 33 meq/L — AB (ref 19–32)
Calcium: 9.8 mg/dL (ref 8.4–10.5)
Chloride: 98 mEq/L (ref 96–112)
Creatinine, Ser: 1.72 mg/dL — ABNORMAL HIGH (ref 0.40–1.20)
GFR: 29.77 mL/min — ABNORMAL LOW (ref >60.00)
GLUCOSE: 264 mg/dL — AB (ref 70–99)
POTASSIUM: 4.2 meq/L (ref 3.5–5.1)
SODIUM: 139 meq/L (ref 135–145)

## 2015-12-06 MED ORDER — CARVEDILOL 12.5 MG PO TABS: 12.5000 mg | ORAL_TABLET | Freq: Every day | ORAL | Status: DC

## 2015-12-06 MED ORDER — SACUBITRIL-VALSARTAN 49-51 MG PO TABS: 1.0000 | ORAL_TABLET | Freq: Two times a day (BID) | ORAL | Status: DC

## 2015-12-06 MED ORDER — CITALOPRAM HYDROBROMIDE 20 MG PO TABS: 20.0000 mg | ORAL_TABLET | Freq: Every day | ORAL | Status: DC

## 2015-12-06 NOTE — Telephone Encounter (Signed)
Mary Cook called in wanting to inform the nurse that the pt has gained 4 lbs from 4/5 and has consumed large amounts of sodium. Nurse stated that she will be giving recommendations to help assist with her until her appt with [name] on 4/17. Follow up with her if you need to.   Thanks

## 2015-12-06 NOTE — Telephone Encounter (Signed)
Home Health Cert/Plan of Care received (11/19/2015 - 01/17/2016) and placed on MD's desk for signature

## 2015-12-06 NOTE — Assessment & Plan Note (Signed)
On Coreg, Avapro, Lasix, Entresto Risks associated with treatment noncompliance were discussed w/pt' neighbour. Compliance was encouraged.

## 2015-12-06 NOTE — Assessment & Plan Note (Signed)
On [name] 

## 2015-12-06 NOTE — Progress Notes (Signed)
Pre visit review using our clinic review tool, if applicable. No additional management support is needed unless otherwise documented below in the visit note. 

## 2015-12-06 NOTE — Assessment & Plan Note (Signed)
No recent falls Using a cane 

## 2015-12-06 NOTE — Assessment & Plan Note (Signed)
2015 per husband On Aricept Friend - [name] David

## 2015-12-06 NOTE — Telephone Encounter (Signed)
Returned call. Verdis Frederickson, [facility] has been trying to track pt's home diet, but thinks husband is taking her to eat out several times a week. Notes she has had a weight gain of 4-5 lbs over ~1-2 weeks. Pt is compliant w/ current med regimen, but HHRN feels she is just not doing what she should be doing as far as lifestyle/diet. No SOB or new fatigue reported.  Encouraged compliance - Advised leg elevation, etc, reinforcement of salt restriction. Advised to call if pt develops any SOB or further weight gain.  Pt to see [name] on 4/17, I advised to call if problems in interim.

## 2015-12-06 NOTE — Assessment & Plan Note (Signed)
Labs

## 2015-12-06 NOTE — Progress Notes (Signed)
Subjective:  Patient ID: Mary Cook, female    DOB: February 04, 1926  Age: 82 y.o. MRN: [id number]  CC: No chief complaint on file.   HPI [employer] presents for post-hosp f/u (d/c 3/24): "Acute on chronic combined systolic and diastolic heart failure with hypoxic respiratory failure due to pulmonary edema Patient's shortness of breath, elevated BNP and leg edema are consistent with CHF exacerbation. 2-D echo on 01/31/15 showed EF 30 - 35 % with grade 1 diastolic dysfunction. Cardiology was consulted and have followed patient while hospitalized. She was diuresed with IV lasix and converted to po as she had mild bump in Cr. She was net negative 3L and weight decreased with 5 lbs. Her breathing has improved significantly. Repeat CXR showed improvement in her pulmonary edema. She was able to be weaned off to room air. "  Hosp documents were reviewed  F/u CHF, CAD, DM, dementia  Outpatient Prescriptions Prior to Visit  Medication Sig Dispense Refill  . acetaminophen (TYLENOL) 325 MG tablet Take 2 tablets (650 mg total) by mouth every 4 (four) hours as needed for headache or mild pain.    Marland Kitchen aspirin 81 MG EC tablet Take 81 mg by mouth daily.      Marland Kitchen donepezil (ARICEPT) 5 MG tablet Take 1 tablet (5 mg total) by mouth at bedtime. 90 tablet 3  . furosemide (LASIX) 40 MG tablet Take 1 tablet (40 mg total) by mouth 2 (two) times daily. 90 tablet 3  . isosorbide mononitrate (IMDUR) 60 MG 24 hr tablet Take 1 tablet (60 mg total) by mouth daily. 30 tablet 3  . metFORMIN (GLUCOPHAGE) 500 MG tablet TAKE 1 TABLET TWICE DAILY BEFORE A MEAL 180 tablet 3  . omeprazole (PRILOSEC) 20 MG capsule Take 20 mg by mouth daily.    . potassium chloride SA (K-DUR,KLOR-CON) 20 MEQ tablet Take 1 tablet (20 mEq total) by mouth daily. 90 tablet 3  . traMADol (ULTRAM) 50 MG tablet Take 50 mg by mouth every 8 (eight) hours as needed for moderate pain or severe pain.    Marland Kitchen UNABLE TO FIND Outpatient PHYSICAL  THERAPY  Diagnosis: TIA (transient ischemic attack) 1 Mutually Defined 0  . carvedilol (COREG) 6.25 MG tablet Take 6.25 mg by mouth 2 (two) times daily.    . citalopram (CELEXA) 20 MG tablet Take 1 tablet (20 mg total) by mouth daily. 90 tablet 0  . sacubitril-valsartan (ENTRESTO) 49-51 MG Take 1 tablet by mouth 2 (two) times daily. 60 tablet 11   No facility-administered medications prior to visit.    ROS Review of Systems  Constitutional: Positive for fatigue. Negative for chills, activity change, appetite change and unexpected weight change.  HENT: Negative for congestion, mouth sores and sinus pressure.   Eyes: Negative for visual disturbance.  Respiratory: Positive for shortness of breath. Negative for cough and chest tightness.   Gastrointestinal: Negative for nausea and abdominal pain.  Genitourinary: Negative for frequency, difficulty urinating and vaginal pain.  Musculoskeletal: Positive for arthralgias. Negative for back pain and gait problem.  Skin: Negative for pallor and rash.  Neurological: Positive for weakness. Negative for dizziness, tremors, numbness and headaches.  Psychiatric/Behavioral: Positive for behavioral problems, sleep disturbance and decreased concentration. Negative for suicidal ideas and confusion. The patient is nervous/anxious.     Objective:  BP 138/62 mmHg  Pulse 66  Wt 172 lb (78.019 kg)  SpO2 94%  BP Readings from Last 3 Encounters:  12/06/15 138/62  11/17/15 122/95  11/14/15 140/100  Wt Readings from Last 3 Encounters:  12/06/15 172 lb (78.019 kg)  11/17/15 168 lb (76.204 kg)  11/14/15 177 lb (80.287 kg)    Physical Exam  Constitutional: She appears well-developed. No distress.  HENT:  Head: Normocephalic.  Right Ear: External ear normal.  Left Ear: External ear normal.  Nose: Nose normal.  Mouth/Throat: Oropharynx is clear and moist.  Eyes: Conjunctivae are normal. Pupils are equal, round, and reactive to light. Right eye  exhibits no discharge. Left eye exhibits no discharge.  Neck: Normal range of motion. Neck supple. No JVD present. No tracheal deviation present. No thyromegaly present.  Cardiovascular: Normal rate, regular rhythm and normal heart sounds.   Pulmonary/Chest: No stridor. No respiratory distress. She has no wheezes.  Abdominal: Soft. Bowel sounds are normal. She exhibits no distension and no mass. There is no tenderness. There is no rebound and no guarding.  Musculoskeletal: She exhibits no edema or tenderness.  Lymphadenopathy:    She has no cervical adenopathy.  Neurological: She displays normal reflexes. No cranial nerve deficit. She exhibits normal muscle tone. Coordination abnormal.  Skin: No rash noted. No erythema.  Psychiatric: Her behavior is normal.  Cane Disoriented  Lab Results  Component Value Date   WBC 6.5 11/16/2015   HGB 11.3* 11/16/2015   HCT 34.8* 11/16/2015   PLT 167 11/16/2015   GLUCOSE 179* 11/17/2015   CHOL 230* 11/16/2015   TRIG 97 11/16/2015   HDL 68 11/16/2015   LDLDIRECT 180.7 12/09/2011   LDLCALC 143* 11/16/2015   ALT 21 11/14/2015   AST 27 11/14/2015   NA 138 11/17/2015   K 3.4* 11/17/2015   CL 102 11/17/2015   CREATININE 1.46* 11/17/2015   BUN 26* 11/17/2015   CO2 26 11/17/2015   TSH 1.56 10/12/2014   INR 1.01 11/15/2015   HGBA1C 8.3* 10/29/2015    Dg Chest Port 1 View  11/14/2015  CLINICAL DATA:  82 year old female with shortness of breath EXAM: PORTABLE CHEST 1 VIEW COMPARISON:  Chest CT dated 10/29/2015 FINDINGS: There is mild cardiomegaly with mild increased interstitial prominence which may represent a degree of congestive changes or edema. A small left pleural effusion is noted. There is no pneumothorax. The lungs are otherwise clear. No focal consolidation. There is degenerative changes of the spine and shoulders. No acute osseous pathology. IMPRESSION: Mild cardiomegaly with possible mild congestive changes and a small left pleural effusion.  Electronically Signed   By: Anner Crete M.D.   On: 11/14/2015 22:27    Assessment & Plan:   Diagnoses and all orders for this visit:  Chronic systolic CHF (congestive heart failure) ([place]) -     Basic metabolic panel; Future  Essential hypertension  [name] deficiency  Late onset Alzheimer's disease with behavioral disturbance  Chronic kidney disease, stage 3 (moderate)  Falls frequently  Other orders -     citalopram (CELEXA) 20 MG tablet; Take 1 tablet (20 mg total) by mouth daily. -     carvedilol (COREG) 12.5 MG tablet; Take 1 tablet (12.5 mg total) by mouth daily. -     sacubitril-valsartan (ENTRESTO) 49-51 MG; Take 1 tablet by mouth 2 (two) times daily.  I have discontinued Ms. Chestang's carvedilol. I have also changed her carvedilol. Additionally, I am having her maintain her aspirin, acetaminophen, potassium chloride SA, metFORMIN, furosemide, donepezil, UNABLE TO FIND, omeprazole, traMADol, isosorbide mononitrate, citalopram, and sacubitril-valsartan.  Meds ordered this encounter  Medications  . DISCONTD: carvedilol (COREG) 12.5 MG tablet    Sig:  Take 1 tablet by mouth daily.  . citalopram (CELEXA) 20 MG tablet    Sig: Take 1 tablet (20 mg total) by mouth daily.    Dispense:  90 tablet    Refill:  2  . carvedilol (COREG) 12.5 MG tablet    Sig: Take 1 tablet (12.5 mg total) by mouth daily.    Dispense:  90 tablet    Refill:  3  . sacubitril-valsartan (ENTRESTO) 49-51 MG    Sig: Take 1 tablet by mouth 2 (two) times daily.    Dispense:  180 tablet    Refill:  3     Follow-up: Return in about 3 months (around 03/06/2016) for a follow-up visit.  Walker Kehr, MD

## 2015-12-07 DIAGNOSIS — I130 Hypertensive heart and chronic kidney disease with heart failure and stage 1 through stage 4 chronic kidney disease, or unspecified chronic kidney disease: Secondary | ICD-10-CM | POA: Diagnosis not present

## 2015-12-07 NOTE — Telephone Encounter (Signed)
Paperwork signed, faxed, copy sent to scan 

## 2015-12-08 DIAGNOSIS — I2510 Atherosclerotic heart disease of native coronary artery without angina pectoris: Secondary | ICD-10-CM | POA: Diagnosis not present

## 2015-12-08 DIAGNOSIS — G4733 Obstructive sleep apnea (adult) (pediatric): Secondary | ICD-10-CM | POA: Diagnosis not present

## 2015-12-08 DIAGNOSIS — F0280 Dementia in other diseases classified elsewhere without behavioral disturbance: Secondary | ICD-10-CM | POA: Diagnosis not present

## 2015-12-08 DIAGNOSIS — E119 Type 2 diabetes mellitus without complications: Secondary | ICD-10-CM | POA: Diagnosis not present

## 2015-12-08 DIAGNOSIS — I5043 Acute on chronic combined systolic (congestive) and diastolic (congestive) heart failure: Secondary | ICD-10-CM | POA: Diagnosis not present

## 2015-12-08 DIAGNOSIS — I255 Ischemic cardiomyopathy: Secondary | ICD-10-CM | POA: Diagnosis not present

## 2015-12-08 DIAGNOSIS — N183 Chronic kidney disease, stage 3 (moderate): Secondary | ICD-10-CM | POA: Diagnosis not present

## 2015-12-08 DIAGNOSIS — G309 Alzheimer's disease, unspecified: Secondary | ICD-10-CM | POA: Diagnosis not present

## 2015-12-08 DIAGNOSIS — I130 Hypertensive heart and chronic kidney disease with heart failure and stage 1 through stage 4 chronic kidney disease, or unspecified chronic kidney disease: Secondary | ICD-10-CM | POA: Diagnosis not present

## 2015-12-11 ENCOUNTER — Ambulatory Visit: Payer: Commercial Managed Care - HMO | Admitting: Cardiology

## 2015-12-11 DIAGNOSIS — G309 Alzheimer's disease, unspecified: Secondary | ICD-10-CM | POA: Diagnosis not present

## 2015-12-11 DIAGNOSIS — I2510 Atherosclerotic heart disease of native coronary artery without angina pectoris: Secondary | ICD-10-CM | POA: Diagnosis not present

## 2015-12-11 DIAGNOSIS — I130 Hypertensive heart and chronic kidney disease with heart failure and stage 1 through stage 4 chronic kidney disease, or unspecified chronic kidney disease: Secondary | ICD-10-CM | POA: Diagnosis not present

## 2015-12-11 DIAGNOSIS — F0280 Dementia in other diseases classified elsewhere without behavioral disturbance: Secondary | ICD-10-CM | POA: Diagnosis not present

## 2015-12-11 DIAGNOSIS — E119 Type 2 diabetes mellitus without complications: Secondary | ICD-10-CM | POA: Diagnosis not present

## 2015-12-11 DIAGNOSIS — N183 Chronic kidney disease, stage 3 (moderate): Secondary | ICD-10-CM | POA: Diagnosis not present

## 2015-12-11 DIAGNOSIS — I255 Ischemic cardiomyopathy: Secondary | ICD-10-CM | POA: Diagnosis not present

## 2015-12-11 DIAGNOSIS — I5043 Acute on chronic combined systolic (congestive) and diastolic (congestive) heart failure: Secondary | ICD-10-CM | POA: Diagnosis not present

## 2015-12-11 DIAGNOSIS — G4733 Obstructive sleep apnea (adult) (pediatric): Secondary | ICD-10-CM | POA: Diagnosis not present

## 2015-12-13 DIAGNOSIS — F0280 Dementia in other diseases classified elsewhere without behavioral disturbance: Secondary | ICD-10-CM | POA: Diagnosis not present

## 2015-12-13 DIAGNOSIS — I255 Ischemic cardiomyopathy: Secondary | ICD-10-CM | POA: Diagnosis not present

## 2015-12-13 DIAGNOSIS — I130 Hypertensive heart and chronic kidney disease with heart failure and stage 1 through stage 4 chronic kidney disease, or unspecified chronic kidney disease: Secondary | ICD-10-CM | POA: Diagnosis not present

## 2015-12-13 DIAGNOSIS — I5043 Acute on chronic combined systolic (congestive) and diastolic (congestive) heart failure: Secondary | ICD-10-CM | POA: Diagnosis not present

## 2015-12-13 DIAGNOSIS — E119 Type 2 diabetes mellitus without complications: Secondary | ICD-10-CM | POA: Diagnosis not present

## 2015-12-13 DIAGNOSIS — N183 Chronic kidney disease, stage 3 (moderate): Secondary | ICD-10-CM | POA: Diagnosis not present

## 2015-12-13 DIAGNOSIS — G309 Alzheimer's disease, unspecified: Secondary | ICD-10-CM | POA: Diagnosis not present

## 2015-12-13 DIAGNOSIS — I2510 Atherosclerotic heart disease of native coronary artery without angina pectoris: Secondary | ICD-10-CM | POA: Diagnosis not present

## 2015-12-13 DIAGNOSIS — G4733 Obstructive sleep apnea (adult) (pediatric): Secondary | ICD-10-CM | POA: Diagnosis not present

## 2015-12-14 ENCOUNTER — Telehealth: Payer: Self-pay | Admitting: *Deleted

## 2015-12-14 DIAGNOSIS — I5043 Acute on chronic combined systolic (congestive) and diastolic (congestive) heart failure: Secondary | ICD-10-CM | POA: Diagnosis not present

## 2015-12-14 DIAGNOSIS — I130 Hypertensive heart and chronic kidney disease with heart failure and stage 1 through stage 4 chronic kidney disease, or unspecified chronic kidney disease: Secondary | ICD-10-CM | POA: Diagnosis not present

## 2015-12-14 DIAGNOSIS — E119 Type 2 diabetes mellitus without complications: Secondary | ICD-10-CM | POA: Diagnosis not present

## 2015-12-14 DIAGNOSIS — I255 Ischemic cardiomyopathy: Secondary | ICD-10-CM | POA: Diagnosis not present

## 2015-12-14 DIAGNOSIS — I10 Essential (primary) hypertension: Secondary | ICD-10-CM

## 2015-12-14 DIAGNOSIS — G4733 Obstructive sleep apnea (adult) (pediatric): Secondary | ICD-10-CM | POA: Diagnosis not present

## 2015-12-14 DIAGNOSIS — F0280 Dementia in other diseases classified elsewhere without behavioral disturbance: Secondary | ICD-10-CM | POA: Diagnosis not present

## 2015-12-14 DIAGNOSIS — E785 Hyperlipidemia, unspecified: Secondary | ICD-10-CM

## 2015-12-14 DIAGNOSIS — E1169 Type 2 diabetes mellitus with other specified complication: Secondary | ICD-10-CM

## 2015-12-14 DIAGNOSIS — N183 Chronic kidney disease, stage 3 (moderate): Secondary | ICD-10-CM | POA: Diagnosis not present

## 2015-12-14 DIAGNOSIS — G309 Alzheimer's disease, unspecified: Secondary | ICD-10-CM | POA: Diagnosis not present

## 2015-12-14 DIAGNOSIS — I2510 Atherosclerotic heart disease of native coronary artery without angina pectoris: Secondary | ICD-10-CM | POA: Diagnosis not present

## 2015-12-14 MED ORDER — ACCU-CHEK SOFTCLIX LANCETS MISC: 1.0000 | Freq: Two times a day (BID) | Status: DC

## 2015-12-14 MED ORDER — ACCU-CHEK AVIVA PLUS W/DEVICE KIT: 1.0000 | PACK | Freq: Two times a day (BID) | Status: DC

## 2015-12-14 MED ORDER — ACCU-CHEK AVIVA PLUS VI STRP: 1.0000 | ORAL_STRIP | Freq: Two times a day (BID) | Status: DC

## 2015-12-14 NOTE — Telephone Encounter (Signed)
Noted Can we ref them to [facility]? They need a SW visit Thx

## 2015-12-14 NOTE — Telephone Encounter (Signed)
Caller states patient has not taken her meds for some days now. She states the patient and her husband have dementia and have simply forgotten. Mary Cook states pt has misplaced her glucometer. She is requesting a new Rx for one to be sent to [facility] in [place].  Done. See meds.

## 2015-12-15 ENCOUNTER — Encounter: Payer: Self-pay | Admitting: *Deleted

## 2015-12-18 DIAGNOSIS — G309 Alzheimer's disease, unspecified: Secondary | ICD-10-CM | POA: Diagnosis not present

## 2015-12-18 DIAGNOSIS — G4733 Obstructive sleep apnea (adult) (pediatric): Secondary | ICD-10-CM | POA: Diagnosis not present

## 2015-12-18 DIAGNOSIS — I130 Hypertensive heart and chronic kidney disease with heart failure and stage 1 through stage 4 chronic kidney disease, or unspecified chronic kidney disease: Secondary | ICD-10-CM | POA: Diagnosis not present

## 2015-12-18 DIAGNOSIS — I255 Ischemic cardiomyopathy: Secondary | ICD-10-CM | POA: Diagnosis not present

## 2015-12-18 DIAGNOSIS — F0280 Dementia in other diseases classified elsewhere without behavioral disturbance: Secondary | ICD-10-CM | POA: Diagnosis not present

## 2015-12-18 DIAGNOSIS — N183 Chronic kidney disease, stage 3 (moderate): Secondary | ICD-10-CM | POA: Diagnosis not present

## 2015-12-18 DIAGNOSIS — E119 Type 2 diabetes mellitus without complications: Secondary | ICD-10-CM | POA: Diagnosis not present

## 2015-12-18 DIAGNOSIS — I5043 Acute on chronic combined systolic (congestive) and diastolic (congestive) heart failure: Secondary | ICD-10-CM | POA: Diagnosis not present

## 2015-12-18 DIAGNOSIS — I2510 Atherosclerotic heart disease of native coronary artery without angina pectoris: Secondary | ICD-10-CM | POA: Diagnosis not present

## 2015-12-18 NOTE — Telephone Encounter (Signed)
THN referral placed.  

## 2015-12-19 ENCOUNTER — Other Ambulatory Visit: Payer: Self-pay | Admitting: *Deleted

## 2015-12-19 NOTE — Patient Outreach (Signed)
[facility]) Care Management  12/19/2015  [place] 1925-10-04 [id number]   Referral from MD office-reason for consult-Alzheimer's, noncompliance with medication & diet, HTN, Diabetes.  Telephone to patient phone; left message on voice mail requesting call back.  Per medical record patient has [facility]; telephone call to liaison requesting RN Case Manager call back regarding home health care plan.  Plan:  Will follow up. Sherrin Daisy, RN BSN [place] Management Coordinator [facility] Care Management  [phone]

## 2015-12-20 ENCOUNTER — Ambulatory Visit: Payer: Self-pay | Admitting: *Deleted

## 2015-12-20 DIAGNOSIS — G4733 Obstructive sleep apnea (adult) (pediatric): Secondary | ICD-10-CM | POA: Diagnosis not present

## 2015-12-20 DIAGNOSIS — I130 Hypertensive heart and chronic kidney disease with heart failure and stage 1 through stage 4 chronic kidney disease, or unspecified chronic kidney disease: Secondary | ICD-10-CM | POA: Diagnosis not present

## 2015-12-20 DIAGNOSIS — I5043 Acute on chronic combined systolic (congestive) and diastolic (congestive) heart failure: Secondary | ICD-10-CM | POA: Diagnosis not present

## 2015-12-20 DIAGNOSIS — I255 Ischemic cardiomyopathy: Secondary | ICD-10-CM | POA: Diagnosis not present

## 2015-12-20 DIAGNOSIS — N183 Chronic kidney disease, stage 3 (moderate): Secondary | ICD-10-CM | POA: Diagnosis not present

## 2015-12-20 DIAGNOSIS — G309 Alzheimer's disease, unspecified: Secondary | ICD-10-CM | POA: Diagnosis not present

## 2015-12-20 DIAGNOSIS — I2510 Atherosclerotic heart disease of native coronary artery without angina pectoris: Secondary | ICD-10-CM | POA: Diagnosis not present

## 2015-12-20 DIAGNOSIS — F0280 Dementia in other diseases classified elsewhere without behavioral disturbance: Secondary | ICD-10-CM | POA: Diagnosis not present

## 2015-12-20 DIAGNOSIS — E119 Type 2 diabetes mellitus without complications: Secondary | ICD-10-CM | POA: Diagnosis not present

## 2015-12-22 ENCOUNTER — Other Ambulatory Visit: Payer: Self-pay | Admitting: *Deleted

## 2015-12-22 DIAGNOSIS — N183 Chronic kidney disease, stage 3 (moderate): Secondary | ICD-10-CM | POA: Diagnosis not present

## 2015-12-22 DIAGNOSIS — I2510 Atherosclerotic heart disease of native coronary artery without angina pectoris: Secondary | ICD-10-CM | POA: Diagnosis not present

## 2015-12-22 DIAGNOSIS — G4733 Obstructive sleep apnea (adult) (pediatric): Secondary | ICD-10-CM | POA: Diagnosis not present

## 2015-12-22 DIAGNOSIS — F0280 Dementia in other diseases classified elsewhere without behavioral disturbance: Secondary | ICD-10-CM | POA: Diagnosis not present

## 2015-12-22 DIAGNOSIS — E119 Type 2 diabetes mellitus without complications: Secondary | ICD-10-CM | POA: Diagnosis not present

## 2015-12-22 DIAGNOSIS — I130 Hypertensive heart and chronic kidney disease with heart failure and stage 1 through stage 4 chronic kidney disease, or unspecified chronic kidney disease: Secondary | ICD-10-CM | POA: Diagnosis not present

## 2015-12-22 DIAGNOSIS — G309 Alzheimer's disease, unspecified: Secondary | ICD-10-CM | POA: Diagnosis not present

## 2015-12-22 DIAGNOSIS — I5043 Acute on chronic combined systolic (congestive) and diastolic (congestive) heart failure: Secondary | ICD-10-CM | POA: Diagnosis not present

## 2015-12-22 DIAGNOSIS — I255 Ischemic cardiomyopathy: Secondary | ICD-10-CM | POA: Diagnosis not present

## 2015-12-22 NOTE — Patient Outreach (Signed)
[place] [facility]) Care Management  12/22/2015  [place] [facility] Jul 08, 1926 [id number]   Received voicemail return call from [place] RN-Maria.  Return call to RN-Maria of [facility] ; left message requesting return call.  Plan: will follow up.   Sherrin Daisy, RN BSN [place] Management Coordinator [facility] Care Management  [phone]

## 2015-12-25 ENCOUNTER — Other Ambulatory Visit: Payer: Self-pay | Admitting: *Deleted

## 2015-12-25 DIAGNOSIS — F0280 Dementia in other diseases classified elsewhere without behavioral disturbance: Secondary | ICD-10-CM | POA: Diagnosis not present

## 2015-12-25 DIAGNOSIS — G309 Alzheimer's disease, unspecified: Secondary | ICD-10-CM | POA: Diagnosis not present

## 2015-12-25 DIAGNOSIS — G4733 Obstructive sleep apnea (adult) (pediatric): Secondary | ICD-10-CM | POA: Diagnosis not present

## 2015-12-25 DIAGNOSIS — N183 Chronic kidney disease, stage 3 (moderate): Secondary | ICD-10-CM | POA: Diagnosis not present

## 2015-12-25 DIAGNOSIS — I5043 Acute on chronic combined systolic (congestive) and diastolic (congestive) heart failure: Secondary | ICD-10-CM | POA: Diagnosis not present

## 2015-12-25 DIAGNOSIS — I2510 Atherosclerotic heart disease of native coronary artery without angina pectoris: Secondary | ICD-10-CM | POA: Diagnosis not present

## 2015-12-25 DIAGNOSIS — I255 Ischemic cardiomyopathy: Secondary | ICD-10-CM | POA: Diagnosis not present

## 2015-12-25 DIAGNOSIS — E119 Type 2 diabetes mellitus without complications: Secondary | ICD-10-CM | POA: Diagnosis not present

## 2015-12-25 DIAGNOSIS — I130 Hypertensive heart and chronic kidney disease with heart failure and stage 1 through stage 4 chronic kidney disease, or unspecified chronic kidney disease: Secondary | ICD-10-CM | POA: Diagnosis not present

## 2015-12-25 NOTE — Patient Outreach (Signed)
[place] [facility]) Care Management  12/25/2015  [place] 11/01/1925 [id number]   Telephone call to contact [place]; left message on voice mail requesting call back.  Plan:  Will follow up with return call.   Sherrin Daisy, RN BSN [place] Management Coordinator [facility] Care Management  [phone]

## 2015-12-26 ENCOUNTER — Encounter: Payer: Self-pay | Admitting: Cardiology

## 2015-12-26 ENCOUNTER — Ambulatory Visit (INDEPENDENT_AMBULATORY_CARE_PROVIDER_SITE_OTHER): Payer: Commercial Managed Care - HMO | Admitting: Cardiology

## 2015-12-26 VITALS — BP 110/66 | HR 64 | Ht 65.0 in | Wt 169.0 lb

## 2015-12-26 DIAGNOSIS — I5022 Chronic systolic (congestive) heart failure: Secondary | ICD-10-CM

## 2015-12-26 DIAGNOSIS — I5043 Acute on chronic combined systolic (congestive) and diastolic (congestive) heart failure: Secondary | ICD-10-CM | POA: Diagnosis not present

## 2015-12-26 DIAGNOSIS — I2510 Atherosclerotic heart disease of native coronary artery without angina pectoris: Secondary | ICD-10-CM | POA: Diagnosis not present

## 2015-12-26 DIAGNOSIS — Z9861 Coronary angioplasty status: Secondary | ICD-10-CM

## 2015-12-26 DIAGNOSIS — N183 Chronic kidney disease, stage 3 unspecified: Secondary | ICD-10-CM

## 2015-12-26 NOTE — Patient Instructions (Signed)
Medication Instructions:   START TAKING LASIX 20MG  ONCE A DAY ON MON WED AND FRI.  ON OTHER DAYS  TUES THURS SAT AND SUN TAKE 20 MG TWICE A DAY   If you need a refill on your cardiac medications before your next appointment, please call your pharmacy.  Labwork: NONE ORDER TODAY    Testing/Procedures:.NONE ORDER TODAY    Follow-Up: AS ALREADY SCHEDULED   Any Other Special Instructions Will Be Listed Below (If Applicable).

## 2015-12-26 NOTE — Progress Notes (Signed)
12/26/2015 Mary Cook   05-Aug-1926  [id number]  Primary Physician Walker Kehr, MD Primary Cardiologist: Dr Claiborne Billings  HPI:  82 y/o female followed by Dr Claiborne Billings with a history of CAD, DM, CRI, and HTN. She was recently admitted with chest pain and acute on chronic CHF. She had apparently not been following her diet or taking her medications correctly, (H/O Alzheimer's). The pt had a Myoview that was abnormal, plan is for medical Rx for now. Her EF by echo was 25%. She is in the office today for follow up. Her daughter accompanied her. The pt says she feels dizzy when getting up and generally weak. She recently saw Dr Alain Marion and had labs- her BMP showed a bump in her SCr to 1.72 and BUN to 43.    Current Outpatient Prescriptions  Medication Sig Dispense Refill  . ACCU-CHEK AVIVA PLUS test strip 1 each by Other route 2 (two) times daily. Dx: E11.9, I 10 100 each 2  . ACCU-CHEK SOFTCLIX LANCETS lancets 1 each by Other route 2 (two) times daily. Dx: E11.9, I 10 100 each 2  . acetaminophen (TYLENOL) 325 MG tablet Take 2 tablets (650 mg total) by mouth every 4 (four) hours as needed for headache or mild pain.    Marland Kitchen aspirin 81 MG EC tablet Take 81 mg by mouth daily.      . Blood Glucose Monitoring Suppl (ACCU-CHEK AVIVA PLUS) w/Device KIT 1 each by Does not apply route 2 (two) times daily. Dx: E11.9, I 10 1 kit 0  . carvedilol (COREG) 12.5 MG tablet Take 1 tablet (12.5 mg total) by mouth daily. 90 tablet 3  . citalopram (CELEXA) 20 MG tablet Take 1 tablet (20 mg total) by mouth daily. 90 tablet 2  . donepezil (ARICEPT) 5 MG tablet Take 1 tablet (5 mg total) by mouth at bedtime. 90 tablet 3  . furosemide (LASIX) 40 MG tablet Take 1 tablet (40 mg total) by mouth 2 (two) times daily. 90 tablet 3  . isosorbide mononitrate (IMDUR) 60 MG 24 hr tablet Take 1 tablet (60 mg total) by mouth daily. 30 tablet 3  . metFORMIN (GLUCOPHAGE) 500 MG tablet TAKE 1 TABLET TWICE DAILY BEFORE A MEAL 180 tablet 3   . omeprazole (PRILOSEC) 20 MG capsule Take 20 mg by mouth daily.    . potassium chloride SA (K-DUR,KLOR-CON) 20 MEQ tablet Take 1 tablet (20 mEq total) by mouth daily. 90 tablet 3  . sacubitril-valsartan (ENTRESTO) 49-51 MG Take 1 tablet by mouth 2 (two) times daily. 180 tablet 3  . UNABLE TO FIND Outpatient PHYSICAL THERAPY  Diagnosis: TIA (transient ischemic attack) 1 Mutually Defined 0   No current facility-administered medications for this visit.    Allergies  Allergen Reactions  . Clopidogrel Bisulfate Nausea And Vomiting    Patient is not aware of this allergy  . Lipitor [Atorvastatin] Other (See Comments)    Weak legs  . Lisinopril Cough    Patient is not aware of this allergy    Social History   Social History  . Marital Status: Married    Spouse Name: N/A  . Number of Children: N/A  . Years of Education: N/A   Occupational History  . Retired    Social History Main Topics  . Smoking status: Never Smoker   . Smokeless tobacco: Never Used  . Alcohol Use: No  . Drug Use: No  . Sexual Activity: Not on file   Other Topics Concern  .  Not on file   Social History Narrative     Review of Systems: General: negative for chills, fever, night sweats or weight changes.  Cardiovascular: negative for chest pain, dyspnea on exertion, edema, orthopnea, palpitations, paroxysmal nocturnal dyspnea or shortness of breath Dermatological: negative for rash Respiratory: negative for cough or wheezing Urologic: negative for hematuria Abdominal: negative for nausea, vomiting, diarrhea, bright red blood per rectum, melena, or hematemesis Neurologic: negative for visual changes, syncope, or dizziness All other systems reviewed and are otherwise negative except as noted above.    Blood pressure 110/66, pulse 64, height '5\' 5"'$  (1.651 m), weight 169 lb (76.658 kg).  General appearance: alert, cooperative and no distress Lungs: clear to auscultation bilaterally Heart: regular rate  and rhythm Extremities: no edema Skin: Skin color, texture, turgor normal. No rashes or lesions Neurologic: Grossly normal   ASSESSMENT AND PLAN:   Acute on chronic combined systolic and diastolic CHF, NYHA class 3 Seen today after her hospitalization in March. Pt complains of weakness and dizziness when getting up, no unusual DOE or SOB  Chronic systolic CHF (congestive heart failure) (HCC) EF 25% March 2017  Chronic kidney disease stage III Bump in SCr noted on 12/06/15 BMP- Lasix decreased slightly  CAD S/P CFX PCI Sept 2011 CFX DES 9/11, cath 12/12, 10/15- medical Rx Myoview abnormal March 2017- medical Rx   PLAN  I decreased her Lasix slightly- 20 mg MWF, 20 mg BID other days. She has an appointment with Dr Claiborne Billings later this month and I suggested she keep it to f/u after these changes.  [name] K PA-C 12/26/2015 11:26 AM

## 2015-12-26 NOTE — Assessment & Plan Note (Signed)
CFX DES 9/11, cath 12/12, 10/15- medical Rx Myoview abnormal March 2017- medical Rx

## 2015-12-26 NOTE — Assessment & Plan Note (Signed)
Bump in SCr noted on 12/06/15 BMP- Lasix decreased slightly

## 2015-12-26 NOTE — Assessment & Plan Note (Signed)
Seen today after her hospitalization in March. Pt complains of weakness and dizziness when getting up, no unusual DOE or SOB

## 2015-12-26 NOTE — Assessment & Plan Note (Signed)
EF 25% March 2017

## 2015-12-27 ENCOUNTER — Other Ambulatory Visit: Payer: Self-pay | Admitting: *Deleted

## 2015-12-27 DIAGNOSIS — E119 Type 2 diabetes mellitus without complications: Secondary | ICD-10-CM | POA: Diagnosis not present

## 2015-12-27 DIAGNOSIS — I130 Hypertensive heart and chronic kidney disease with heart failure and stage 1 through stage 4 chronic kidney disease, or unspecified chronic kidney disease: Secondary | ICD-10-CM | POA: Diagnosis not present

## 2015-12-27 DIAGNOSIS — N183 Chronic kidney disease, stage 3 (moderate): Secondary | ICD-10-CM | POA: Diagnosis not present

## 2015-12-27 DIAGNOSIS — I2510 Atherosclerotic heart disease of native coronary artery without angina pectoris: Secondary | ICD-10-CM | POA: Diagnosis not present

## 2015-12-27 DIAGNOSIS — G309 Alzheimer's disease, unspecified: Secondary | ICD-10-CM | POA: Diagnosis not present

## 2015-12-27 DIAGNOSIS — I5043 Acute on chronic combined systolic (congestive) and diastolic (congestive) heart failure: Secondary | ICD-10-CM | POA: Diagnosis not present

## 2015-12-27 DIAGNOSIS — F0280 Dementia in other diseases classified elsewhere without behavioral disturbance: Secondary | ICD-10-CM | POA: Diagnosis not present

## 2015-12-27 DIAGNOSIS — G4733 Obstructive sleep apnea (adult) (pediatric): Secondary | ICD-10-CM | POA: Diagnosis not present

## 2015-12-27 DIAGNOSIS — I255 Ischemic cardiomyopathy: Secondary | ICD-10-CM | POA: Diagnosis not present

## 2015-12-27 NOTE — Patient Outreach (Signed)
[place] [facility]) Care Management  12/27/2015  [place] 26-Jun-1926 [id number]  Received incoming call from [name], friend and caregiver for patient. States she helps patient out because she has problem with memory lost.  Advised friend that this RN CM will need to get permission from patient to speak with her about her health. States she is currently not with patient but will be with patient tomorrow AM. Requested that I call tomorrow & could call speak with both of them together.   Plan:  Will follow up. Sherrin Daisy, RN BSN [place] Management Coordinator [facility] Care Management  [phone]

## 2015-12-28 ENCOUNTER — Encounter: Payer: Self-pay | Admitting: *Deleted

## 2015-12-28 ENCOUNTER — Other Ambulatory Visit: Payer: Self-pay | Admitting: *Deleted

## 2015-12-28 DIAGNOSIS — I5043 Acute on chronic combined systolic (congestive) and diastolic (congestive) heart failure: Secondary | ICD-10-CM | POA: Diagnosis not present

## 2015-12-28 DIAGNOSIS — E119 Type 2 diabetes mellitus without complications: Secondary | ICD-10-CM | POA: Diagnosis not present

## 2015-12-28 DIAGNOSIS — G309 Alzheimer's disease, unspecified: Secondary | ICD-10-CM | POA: Diagnosis not present

## 2015-12-28 DIAGNOSIS — G4733 Obstructive sleep apnea (adult) (pediatric): Secondary | ICD-10-CM | POA: Diagnosis not present

## 2015-12-28 DIAGNOSIS — I130 Hypertensive heart and chronic kidney disease with heart failure and stage 1 through stage 4 chronic kidney disease, or unspecified chronic kidney disease: Secondary | ICD-10-CM | POA: Diagnosis not present

## 2015-12-28 DIAGNOSIS — F0280 Dementia in other diseases classified elsewhere without behavioral disturbance: Secondary | ICD-10-CM | POA: Diagnosis not present

## 2015-12-28 DIAGNOSIS — I2510 Atherosclerotic heart disease of native coronary artery without angina pectoris: Secondary | ICD-10-CM | POA: Diagnosis not present

## 2015-12-28 DIAGNOSIS — N183 Chronic kidney disease, stage 3 (moderate): Secondary | ICD-10-CM | POA: Diagnosis not present

## 2015-12-28 DIAGNOSIS — I255 Ischemic cardiomyopathy: Secondary | ICD-10-CM | POA: Diagnosis not present

## 2015-12-28 NOTE — Patient Outreach (Addendum)
[place]) Care Management  12/28/2015  [place] 21-Mar-1926 [id number]   Telephone call to patient; friend/caregiver advised that patient was currently not at home. Advised that she would have her call me back on her return.  Received incoming call from caregiver [name]) who had patient available on speaker phone. Patient was able to supply HIPPA verification. Patient was advised of reason for call and of [facility] care management services. She also advised that caregiver/friend-[name] had her permission  to speak with this RN CM regarding her health care concerns.  Subjective: Patient states that she does have memory deficit and sometimes does forget to take her medications as prescribed. States that her friend and spouse do help her with managing her medication.  Patient voices that she recently had a fall this week with only minor abrasions. States she was on her way to doctor's appointment. States she was checked out in MD office. Voices that she is using cane when walking.  Falls prevention strategies discussed with patient and caregiver.  Patient does consent to [facility] care management services. The learner will be patient's caregiver because patient has memory deficit.   Objective:  Per medical record noted conditions include Heart failure, HTN,  Chronic Kidney Disease 3, Alzheimer's disease -late onset.  Patient currently active with [facility].  Encounter Medications:  Outpatient Encounter Prescriptions as of 12/28/2015  Medication Sig Note  . ACCU-CHEK AVIVA PLUS test strip 1 each by Other route 2 (two) times daily. Dx: E11.9, I 10   . ACCU-CHEK SOFTCLIX LANCETS lancets 1 each by Other route 2 (two) times daily. Dx: E11.9, I 10   . acetaminophen (TYLENOL) 325 MG tablet Take 2 tablets (650 mg total) by mouth every 4 (four) hours as needed for headache or mild pain.   Marland Kitchen aspirin 81 MG EC tablet Take 81 mg by mouth daily.     . Blood Glucose Monitoring Suppl  (ACCU-CHEK AVIVA PLUS) w/Device KIT 1 each by Does not apply route 2 (two) times daily. Dx: E11.9, I 10   . carvedilol (COREG) 12.5 MG tablet Take 1 tablet (12.5 mg total) by mouth daily.   . citalopram (CELEXA) 20 MG tablet Take 1 tablet (20 mg total) by mouth daily.   Marland Kitchen donepezil (ARICEPT) 5 MG tablet Take 1 tablet (5 mg total) by mouth at bedtime.   . furosemide (LASIX) 40 MG tablet Take 1 tablet (40 mg total) by mouth 2 (two) times daily. 12/28/2015: Per caregiver/MD instructed to give patient -1 tablet a day on Monday, Wednesday, Friday and give 1 tablet twice daily on Tuesday, Thursday, Saturday and Sunday.  . isosorbide mononitrate (IMDUR) 60 MG 24 hr tablet Take 1 tablet (60 mg total) by mouth daily.   . metFORMIN (GLUCOPHAGE) 500 MG tablet TAKE 1 TABLET TWICE DAILY BEFORE A MEAL   . omeprazole (PRILOSEC) 20 MG capsule Take 20 mg by mouth daily.   . potassium chloride SA (K-DUR,KLOR-CON) 20 MEQ tablet Take 1 tablet (20 mEq total) by mouth daily.   . sacubitril-valsartan (ENTRESTO) 49-51 MG Take 1 tablet by mouth 2 (two) times daily.   Marland Kitchen UNABLE TO FIND Outpatient PHYSICAL THERAPY  Diagnosis: TIA (transient ischemic attack)    No facility-administered encounter medications on file as of 12/28/2015.    Functional Status:  In your present state of health, do you have any difficulty performing the following activities: 12/28/2015 12/28/2015  Hearing? - N  Vision? - N  Difficulty concentrating or making decisions? (  No Data) Y  Walking or climbing stairs? - Y  Dressing or bathing? - N  Doing errands, shopping? - Y  Preparing Food and eating ? Y N  Using the Toilet? - N  In the past six months, have you accidently leaked urine? - Y  Do you have problems with loss of bowel control? - N  Managing your Medications? - Y  Managing your Finances? - Y  Housekeeping or managing your Housekeeping? - Y    Fall/Depression Screening: PHQ 2/9 Scores 12/28/2015 12/06/2015 10/28/2014  PHQ - 2 Score 1 4 0   PHQ- 9 Score - 18 -   Fall Risk  12/28/2015 12/06/2015 10/28/2014  Falls in the past year? Yes Yes Yes  Number falls in past yr: 1 2 or more 2 or more  Injury with Fall? No Yes Yes  Risk Factor Category  - High Fall Risk -  Risk for fall due to : History of fall(s);Impaired balance/gait;Medication side effect - History of fall(s)  Follow up Falls prevention discussed - -     Assessment:  Patient has problem with medication compliance caused by memory deficit.  Patient is high falls risk. Caregiver/friend assists with patient's care and will be learner. THN CM Care Plan Problem One        Most Recent Value   Care Plan Problem One  Medication non-compliance caused by memory deficit   Role Documenting the Problem One  Care Management Telephonic [place] for Problem One  Active   THN Long Term Goal (31-90 days)  Caregiver will verbalize that patiient is compliant with taking medications as ordered by MD in 31 day period   [place]alent Goal Start Date  12/28/15   Interventions for Problem One Long Term Goal  Verbal review of medications with caregiver on dosage & frequency of taking per MD instruction.    THN CM Short Term Goal #1 (0-30 days)  Caregiver will verbally voices reasons patient is taking meds & frequency of dosage within 14 days   THN CM Short Term Goal #1 Start Date  12/28/15   Interventions for Short Term Goal #1  RN CM will review med list with caregiver -dosage, frequency, reason pt it taking  med   THN CM Short Term Goal #2 (0-30 days)  Caregiver will monitor patient daily regarding medication adherence within 14 day days   THN CM Short Term Goal #2 Start Date  12/28/15   Interventions for Short Term Goal #2  RN CM will verrbalize to caregiver strategies to assist with monitoring & med adherence        Plan:  Care coordination with home health agency and pharamcy Will develop care plan. Will follow up with patient/caregiver to complete health  assessments. Sherrin Daisy, RN BSN [place] Management Coordinator [facility] Care Management  [phone]

## 2016-01-01 DIAGNOSIS — I5042 Chronic combined systolic (congestive) and diastolic (congestive) heart failure: Secondary | ICD-10-CM | POA: Diagnosis not present

## 2016-01-01 DIAGNOSIS — I10 Essential (primary) hypertension: Secondary | ICD-10-CM | POA: Diagnosis not present

## 2016-01-02 ENCOUNTER — Encounter: Payer: Self-pay | Admitting: *Deleted

## 2016-01-02 ENCOUNTER — Other Ambulatory Visit: Payer: Self-pay | Admitting: *Deleted

## 2016-01-02 LAB — BASIC METABOLIC PANEL
BUN: 33 mg/dL — AB (ref 7–25)
CALCIUM: 9.2 mg/dL (ref 8.6–10.4)
CO2: 23 mmol/L (ref 20–31)
Chloride: 101 mmol/L (ref 98–110)
Creat: 1.6 mg/dL — ABNORMAL HIGH (ref 0.60–0.88)
GLUCOSE: 173 mg/dL — AB (ref 65–99)
Potassium: 4.2 mmol/L (ref 3.5–5.3)
Sodium: 137 mmol/L (ref 135–146)

## 2016-01-02 LAB — PRO B NATRIURETIC PEPTIDE: Pro B Natriuretic peptide (BNP): 1963 pg/mL — ABNORMAL HIGH (ref <451)

## 2016-01-02 NOTE — Patient Outreach (Signed)
[place] [facility]) Care Management  01/02/2016  [place] May 09, 1926 [id number]  Care coordination call to [place]. She advised that [place] RN was currently active with patient. States plan of care included Heart failure disease management & medication management.  States start of care 03/26.  Currently seeing patient weekly with anticipated end of care on 01/19/2016.   States home health physical therapy worked with patient on falls prevention strategies. States PT services have ended. Also states speech therapy worked on cognition with patient -states services have ended due to lack of progress.   Plan: Will follow up with caregiver.   Sherrin Daisy, RN BSN [place] Management Coordinator [facility] Care Management  [phone]

## 2016-01-03 ENCOUNTER — Other Ambulatory Visit: Payer: Self-pay | Admitting: *Deleted

## 2016-01-03 DIAGNOSIS — N183 Chronic kidney disease, stage 3 (moderate): Secondary | ICD-10-CM | POA: Diagnosis not present

## 2016-01-03 DIAGNOSIS — E119 Type 2 diabetes mellitus without complications: Secondary | ICD-10-CM | POA: Diagnosis not present

## 2016-01-03 DIAGNOSIS — I255 Ischemic cardiomyopathy: Secondary | ICD-10-CM | POA: Diagnosis not present

## 2016-01-03 DIAGNOSIS — G309 Alzheimer's disease, unspecified: Secondary | ICD-10-CM | POA: Diagnosis not present

## 2016-01-03 DIAGNOSIS — F0280 Dementia in other diseases classified elsewhere without behavioral disturbance: Secondary | ICD-10-CM | POA: Diagnosis not present

## 2016-01-03 DIAGNOSIS — G4733 Obstructive sleep apnea (adult) (pediatric): Secondary | ICD-10-CM | POA: Diagnosis not present

## 2016-01-03 DIAGNOSIS — I2510 Atherosclerotic heart disease of native coronary artery without angina pectoris: Secondary | ICD-10-CM | POA: Diagnosis not present

## 2016-01-03 DIAGNOSIS — I130 Hypertensive heart and chronic kidney disease with heart failure and stage 1 through stage 4 chronic kidney disease, or unspecified chronic kidney disease: Secondary | ICD-10-CM | POA: Diagnosis not present

## 2016-01-03 DIAGNOSIS — I5043 Acute on chronic combined systolic (congestive) and diastolic (congestive) heart failure: Secondary | ICD-10-CM | POA: Diagnosis not present

## 2016-01-03 NOTE — Patient Outreach (Addendum)
[facility]) Care Management  01/03/2016  [place] Jan 09, 1926 [id number]   Telephone call to patient; left message on voice mail requesting call back. Telephone call to patient's caregiver who is also the learner.  Medication compliance in patient with memory deficit discussed with caregiver. Medications reviewed with times to be taken by patient. Caregiver voices that she manages patient medications & prepares medication box. States she is usually there in AM to give patient medication.  She is not always there in the PM when she has a 3 PM medication every other day and has medications to take at 9 PM.  States she usually calls to remind patient to take PM dosages. States patient & spouse are not always home so sometimes she may miss dosages.  States has advised patient & spouse of importance of taking medications.  Advised of importance of taking medications at same time daily to get into routine.  Caregiver states she has just ordered medication box with alarm and has used one day.  States patient and spouse go out often in the evening so ensuring that she gets evening dosages can be challenging.    Caregiver voice that she checks patient's blood sugar twice daily & records for MD to review.  States she will taking patient to MD appointment next Thursday 01/11/2016.  Plan: Will follow up in 1 week. Caregiver agrees with set date and she will be at patient's home at set time.  Will send calender/planner to patient/caregiver.  Sherrin Daisy, RN BSN CCM Care Management Coordinator [facility] Care Management  [phone]   Sherrin Daisy, RN BSN [place] Management Coordinator [facility] Care Management  [phone]

## 2016-01-04 ENCOUNTER — Encounter: Payer: Self-pay | Admitting: *Deleted

## 2016-01-08 NOTE — Addendum Note (Signed)
Addended by: Therisa Doyne on: 01/08/2016 04:22 PM   Modules accepted: Orders

## 2016-01-09 DIAGNOSIS — I130 Hypertensive heart and chronic kidney disease with heart failure and stage 1 through stage 4 chronic kidney disease, or unspecified chronic kidney disease: Secondary | ICD-10-CM | POA: Diagnosis not present

## 2016-01-09 DIAGNOSIS — E119 Type 2 diabetes mellitus without complications: Secondary | ICD-10-CM | POA: Diagnosis not present

## 2016-01-09 DIAGNOSIS — G4733 Obstructive sleep apnea (adult) (pediatric): Secondary | ICD-10-CM | POA: Diagnosis not present

## 2016-01-09 DIAGNOSIS — I2510 Atherosclerotic heart disease of native coronary artery without angina pectoris: Secondary | ICD-10-CM | POA: Diagnosis not present

## 2016-01-09 DIAGNOSIS — I255 Ischemic cardiomyopathy: Secondary | ICD-10-CM | POA: Diagnosis not present

## 2016-01-09 DIAGNOSIS — G309 Alzheimer's disease, unspecified: Secondary | ICD-10-CM | POA: Diagnosis not present

## 2016-01-09 DIAGNOSIS — N183 Chronic kidney disease, stage 3 (moderate): Secondary | ICD-10-CM | POA: Diagnosis not present

## 2016-01-09 DIAGNOSIS — F0280 Dementia in other diseases classified elsewhere without behavioral disturbance: Secondary | ICD-10-CM | POA: Diagnosis not present

## 2016-01-09 DIAGNOSIS — I5043 Acute on chronic combined systolic (congestive) and diastolic (congestive) heart failure: Secondary | ICD-10-CM | POA: Diagnosis not present

## 2016-01-10 ENCOUNTER — Other Ambulatory Visit: Payer: Self-pay | Admitting: *Deleted

## 2016-01-11 ENCOUNTER — Ambulatory Visit (INDEPENDENT_AMBULATORY_CARE_PROVIDER_SITE_OTHER): Payer: Commercial Managed Care - HMO | Admitting: Cardiovascular Disease

## 2016-01-11 ENCOUNTER — Other Ambulatory Visit: Payer: Self-pay | Admitting: *Deleted

## 2016-01-11 ENCOUNTER — Encounter: Payer: Self-pay | Admitting: Cardiovascular Disease

## 2016-01-11 VITALS — BP 148/63 | HR 59 | Ht 65.0 in | Wt 171.6 lb

## 2016-01-11 DIAGNOSIS — F0391 Unspecified dementia with behavioral disturbance: Secondary | ICD-10-CM

## 2016-01-11 DIAGNOSIS — F03918 Unspecified dementia, unspecified severity, with other behavioral disturbance: Secondary | ICD-10-CM

## 2016-01-11 DIAGNOSIS — I10 Essential (primary) hypertension: Secondary | ICD-10-CM | POA: Diagnosis not present

## 2016-01-11 DIAGNOSIS — Z9114 Patient's other noncompliance with medication regimen: Secondary | ICD-10-CM

## 2016-01-11 MED ORDER — CARVEDILOL 12.5 MG PO TABS: 6.2500 mg | ORAL_TABLET | Freq: Two times a day (BID) | ORAL | Status: DC

## 2016-01-11 MED ORDER — DIGOXIN 125 MCG PO TABS
0.0625 mg | ORAL_TABLET | Freq: Every day | ORAL | Status: DC
Start: 2016-01-11 — End: 2016-02-22

## 2016-01-11 NOTE — Patient Outreach (Signed)
[facility]) Care Management  01/11/2016  [place] 12-Jul-1926 [id number]   Received call from friend/caregiver; States her trial with calling patient twice daily and alarm system for taking medications had not been working. States she checked medication box that she fixes for patient weekly & there were dosages of medications that she had not taken.   States patient's husband had advised her that patient was not taking medications when she had called to remind her. States she had missed taking several days of taking medications properly in one week. States alarm medication box had not worked effectively this past 7 days.  States when she called to remind patient to take medication she was staying on the line and patient was advising caregiver that she had taken medications. Caregiver is voicing understanding of why patient is taking medications.   Caregiver states she attended appointment with patient at cardiology office 05/18. States MD placed patient on new heart medication(digoxin) and adjusted carvedilol dosage to 1/2 dose of previous dose.  States patient's husband will pick of medication Friday. Caregiver states she continues to fix medication box weekly for patient.    Plan: Refer to pharmacy ; Update care plan as noted. Follow up with caregiver for other care management needs.  Sherrin Daisy, RN BSN [place] Management Coordinator [facility] Care Management  [phone]

## 2016-01-11 NOTE — Patient Instructions (Signed)
Your physician has recommended you make the following change in your medication:   1.) the carvedilol has been changed to 1/2 tablet twice a day.  2.) start new prescription for digoxin as directed on the bottle. A prescription has been sent to your pharmacy.  Your physician recommends that you schedule a follow-up appointment in: 2-3 months with Dr Claiborne Billings.

## 2016-01-12 ENCOUNTER — Ambulatory Visit: Payer: Self-pay | Admitting: *Deleted

## 2016-01-12 ENCOUNTER — Other Ambulatory Visit: Payer: Self-pay | Admitting: *Deleted

## 2016-01-12 NOTE — Patient Outreach (Signed)
[facility]) Care Management  [place]  01/12/2016   [place] 01-21-1926 [id number]  Subjective: Telephone call to patient. [place] verification received from patient.  Patient voices that friend/caregiver manages her medication and goes with her to MD appointments. States RN CM has permission to speak with caregiver regarding her health care concerns and medications. States she has memory loss. States she takes medications as prepared by her friend.  States she has not fallen in last 2 weeks.  Objective: See health care assessments as noted in system.  Encounter Medications:  Outpatient Encounter Prescriptions as of 01/12/2016  Medication Sig Note  . ACCU-CHEK AVIVA PLUS test strip 1 each by Other route 2 (two) times daily. Dx: E11.9, I 10   . ACCU-CHEK SOFTCLIX LANCETS lancets 1 each by Other route 2 (two) times daily. Dx: E11.9, I 10   . acetaminophen (TYLENOL) 325 MG tablet Take 2 tablets (650 mg total) by mouth every 4 (four) hours as needed for headache or mild pain.   Marland Kitchen aspirin 81 MG EC tablet Take 81 mg by mouth daily.     . Blood Glucose Monitoring Suppl (ACCU-CHEK AVIVA PLUS) w/Device KIT 1 each by Does not apply route 2 (two) times daily. Dx: E11.9, I 10   . carvedilol (COREG) 12.5 MG tablet Take 0.5 tablets (6.25 mg total) by mouth 2 (two) times daily with a meal.   . citalopram (CELEXA) 20 MG tablet Take 1 tablet (20 mg total) by mouth daily.   . digoxin (LANOXIN) 0.125 MG tablet Take 0.5 tablets (0.0625 mg total) by mouth daily.   Marland Kitchen donepezil (ARICEPT) 5 MG tablet Take 1 tablet (5 mg total) by mouth at bedtime.   . furosemide (LASIX) 40 MG tablet Take 1 tablet (40 mg total) by mouth 2 (two) times daily. 12/28/2015: Per caregiver/MD instructed to give patient -1 tablet a day on Monday, Wednesday, Friday and give 1 tablet twice daily on Tuesday, Thursday, Saturday and Sunday.  . isosorbide mononitrate (IMDUR) 60 MG 24 hr tablet Take 1 tablet (60 mg  total) by mouth daily.   . metFORMIN (GLUCOPHAGE) 500 MG tablet TAKE 1 TABLET TWICE DAILY BEFORE A MEAL   . omeprazole (PRILOSEC) 20 MG capsule Take 20 mg by mouth daily.   . potassium chloride SA (K-DUR,KLOR-CON) 20 MEQ tablet Take 1 tablet (20 mEq total) by mouth daily.   . sacubitril-valsartan (ENTRESTO) 49-51 MG Take 1 tablet by mouth 2 (two) times daily.   Marland Kitchen UNABLE TO FIND Outpatient PHYSICAL THERAPY  Diagnosis: TIA (transient ischemic attack)    No facility-administered encounter medications on file as of 01/12/2016.    Functional Status:  In your present state of health, do you have any difficulty performing the following activities: 12/28/2015 12/28/2015  Hearing? - N  Vision? - N  Difficulty concentrating or making decisions? (No Data) Y  Walking or climbing stairs? - Y  Dressing or bathing? - N  Doing errands, shopping? - Y  Preparing Food and eating ? Y N  Using the Toilet? - N  In the past six months, have you accidently leaked urine? - Y  Do you have problems with loss of bowel control? - N  Managing your Medications? - Y  Managing your Finances? - Y  Housekeeping or managing your Housekeeping? - Y    Fall/Depression Screening: PHQ 2/9 Scores 12/28/2015 12/06/2015 10/28/2014  PHQ - 2 Score 1 4 0  PHQ- 9 Score - 18 -  Fall Risk  12/28/2015 12/06/2015 10/28/2014  Falls in the past year? Yes Yes Yes  Number falls in past yr: 1 2 or more 2 or more  Injury with Fall? No Yes Yes  Risk Factor Category  - High Fall Risk -  Risk for fall due to : History of fall(s);Impaired balance/gait;Medication side effect - History of fall(s)  Follow up Falls prevention discussed - -     Assessment: patient continues to need assistance with medication compliance. Friend/caregiver is managing medications monitoring pt's adherence to taking medications as prescribed.   Plan: Assist friend/caregiver with strategies to assist with patient medication compliance. Referral to pharmacist. Sherrin Daisy, RN BSN [place] Management Coordinator [facility] Care Management  [phone]

## 2016-01-13 ENCOUNTER — Encounter: Payer: Self-pay | Admitting: Cardiovascular Disease

## 2016-01-13 NOTE — Progress Notes (Signed)
Patient ID: [name], female   DOB: 31-Dec-1925, 82 y.o.   MRN: [id number]     HPI: Mary Cook is a 82 y.o. female who presents to the office today for a 2 month follow up cardiology evaluation.  She was recently hospitalized with CHF.  Mary Cook has known CAD and in September 2011 suffered a large out of hospital anterior wall myocardial infarction, complicated by CHF and post MI pericarditis.  At that time, she had significant thrombus burden requiring thrombectomy.  She underwent stenting of her left circumflex coronary artery with tandem 3.015 mm Promus stents postdilated 3.5 mm meters.  She had concomitant CAD involving her diagonal branch of her LAD as well as RCA.  Last year she was readmitted to the hospital with increasing shortness of breath.  A nuclear study which demonstrated inferior apical ischemia. On 06/08/2014,  repeat cardiac catheterization by me showed moderate LV dysfunction with global hypokinesis and slightly more pronounced inferior hypocontractility.  Ejection fraction was 35%.  There was moderate CAD with 50-60% smooth eccentric stenosis in the first diagonal branch of the LAD, widely patent proximal tandem stents in the circumflex vessel with a focal 70% stenosis of a small AV groove circumflex, and 50-60% stenosis in the proximal RCA with mild mid systolic bridging in a dominant RCA system.  Increased medical therapy was recommended.  She also has a history of obstructive sleep apnea and had been using CPAP therapy.  Presently, she admits to 100% compliance.  She is unaware of breakthrough snoring.  She is diabetic on metformin 500 mg twice a day.  She also has a history of hyperlipidemia for which she has been taking atorvastatin 40 mg.  There is a history of hypertension for which she has been on furosemide 40 mg in addition to her isosorbide.  She  sustained a fall and was evaluated in the emergency room by Dr. Fuller Song in October 2015.  CT of her head  was negative, although she had chronic white matter ischemic change and diffuse severe degenerative changes.  She was also noted to have carotid atherosclerotic vascular disease.  When I saw her in October 2016 since she had had several recent admissions with  acute on chronic combined systolic and diastolic heart failure and had an ejection fraction of 30-35% , I recommended initiation of Entresto at 24/26 and set up a follow up appointment to see [place] DO NOT RESUSCITATE office several weeks later for dose titration.  She stated that she had felt better when she had taken the medicine, but never followed up with the subsequent evaluation and consequently stopped taking the medicine.  Once the samples ran out.  When I saw her last in January 2017, I initiated entresto and provided her with 2 weeks of 24/26 and then titrated her to 49/51.  She states that she has felt fairly well with this medical regimen.  She denies recent significant change in symptomatology with reference to shortness of breath.  Lab work 2 weeks ago revealed a creatinine of 1.17.  She had normal LFTs.  Her potassium was 3.5.  She had undergone a nuclear perfusion study on 10/29/2015 which showed findings consistent with prior MI with very mild peri-infarction ischemia involving the inferolateral wall.  She denies chest pressure.  She is unaware of palpitations.  She denies PND, orthopnea.    Since I saw her, she was hospitalized in March , she had not taken her medicine and had acute on chronic  combined systolic and diastolic heart failure, which subsided.  She now has been back on her medication.  She saw [name] in follow-up.  She states that she feels better on interest oh.  She has renal insufficiency and with her age of 84 her GFR is in the 30s.  She denies chest pressure.  She tells me that she has been taking her furosemide 40 mg twice a day on Monday, Wednesday and Friday and just daily on other days.  She denies PND,  orthopnea.  She presents for reevaluation.  She presents for evaluation.   Past Medical History  Diagnosis Date  . Essential hypertension   . Osteoarthritis   . Osteopenia   . Diverticulosis of colon   . Type II diabetes mellitus ([place])   . CAD (coronary artery disease)     a. 04/2010 MI DES x 2 to LCX;  b. 05/2014 Cath: EF 35%, patent LCX stents w/ 70% stenosis in small AV groove LCX, Diag 50-60, RCA 50-60p->Med Rx.  Marland Kitchen Hyperlipidemia   . GIB (gastrointestinal bleeding)     a. 2002 Diverticular bleed.  . Skin disorder   . Visual disorder   . Thrombus   . Sleep apnea     cpap therapy  . Frequent urination   . Cerebral aneurysm   . Stroke ([place]) 2002  . Chronic combined systolic (congestive) and diastolic (congestive) heart failure ([place])     a. 01/2015 Echo: EF 30-35%, sev inflat HK, Gr 1DD, mild AI.  . Ischemic cardiomyopathy     a. 01/2015 Echo: EF 30-35%.  . T12 compression fracture ([place])     a. 04/2015.    Past Surgical History  Procedure Laterality Date  . Abdominal hysterectomy  1980    no bso  . Coronary angioplasty with stent placement  05/11/2010    stent CFX  . Cardiac catheterization  07/2011    LAD OK, D1 80%, CFX 30% w/ patent stent, RCA 50-60%, EF 40%  . Thrombectomy    . Cardiac catheterization    . Doppler echocardiography    . Myocardial perfusion study    . Left heart catheterization with coronary angiogram N/A 08/18/2011    Procedure: LEFT HEART CATHETERIZATION WITH CORONARY ANGIOGRAM;  Surgeon: Wellington Hampshire, MD;  Location: [facility] CATH LAB;  Service: Cardiovascular;  Laterality: N/A;  . Left heart catheterization with coronary angiogram N/A 06/08/2014    Procedure: LEFT HEART CATHETERIZATION WITH CORONARY ANGIOGRAM;  Surgeon: Troy Sine, MD;  Location: [facility] CATH LAB;  Service: Cardiovascular;  Laterality: N/A;  . Tonsillectomy    . Appendectomy      Allergies  Allergen Reactions  . Clopidogrel Bisulfate Nausea And Vomiting    Patient is not aware of  this allergy  . Lipitor [Atorvastatin] Other (See Comments)    Weak legs  . Lisinopril Cough    Patient is not aware of this allergy    Current Outpatient Prescriptions  Medication Sig Dispense Refill  . ACCU-CHEK AVIVA PLUS test strip 1 each by Other route 2 (two) times daily. Dx: E11.9, I 10 100 each 2  . ACCU-CHEK SOFTCLIX LANCETS lancets 1 each by Other route 2 (two) times daily. Dx: E11.9, I 10 100 each 2  . acetaminophen (TYLENOL) 325 MG tablet Take 2 tablets (650 mg total) by mouth every 4 (four) hours as needed for headache or mild pain.    Marland Kitchen aspirin 81 MG EC tablet Take 81 mg by mouth daily.      Marland Kitchen  Blood Glucose Monitoring Suppl (ACCU-CHEK AVIVA PLUS) w/Device KIT 1 each by Does not apply route 2 (two) times daily. Dx: E11.9, I 10 1 kit 0  . carvedilol (COREG) 12.5 MG tablet Take 0.5 tablets (6.25 mg total) by mouth 2 (two) times daily with a meal. 90 tablet 3  . citalopram (CELEXA) 20 MG tablet Take 1 tablet (20 mg total) by mouth daily. 90 tablet 2  . donepezil (ARICEPT) 5 MG tablet Take 1 tablet (5 mg total) by mouth at bedtime. 90 tablet 3  . furosemide (LASIX) 40 MG tablet Take 1 tablet (40 mg total) by mouth 2 (two) times daily. 90 tablet 3  . isosorbide mononitrate (IMDUR) 60 MG 24 hr tablet Take 1 tablet (60 mg total) by mouth daily. 30 tablet 3  . metFORMIN (GLUCOPHAGE) 500 MG tablet TAKE 1 TABLET TWICE DAILY BEFORE A MEAL 180 tablet 3  . omeprazole (PRILOSEC) 20 MG capsule Take 20 mg by mouth daily.    . potassium chloride SA (K-DUR,KLOR-CON) 20 MEQ tablet Take 1 tablet (20 mEq total) by mouth daily. 90 tablet 3  . sacubitril-valsartan (ENTRESTO) 49-51 MG Take 1 tablet by mouth 2 (two) times daily. 180 tablet 3  . UNABLE TO FIND Outpatient PHYSICAL THERAPY  Diagnosis: TIA (transient ischemic attack) 1 Mutually Defined 0  . digoxin (LANOXIN) 0.125 MG tablet Take 0.5 tablets (0.0625 mg total) by mouth daily. 30 tablet 3   No current facility-administered medications for  this visit.    Social History   Social History  . Marital Status: Married    Spouse Name: N/A  . Number of Children: N/A  . Years of Education: N/A   Occupational History  . Retired    Social History Main Topics  . Smoking status: Never Smoker   . Smokeless tobacco: Never Used  . Alcohol Use: No  . Drug Use: No  . Sexual Activity: Not on file   Other Topics Concern  . Not on file   Social History Narrative    Family History  Problem Relation Age of Onset  . Hypertension Other   . CAD      father    ROS General: Negative; No fevers, chills, or night sweats HEENT: Negative; No changes in vision or hearing, sinus congestion, difficulty swallowing Pulmonary: Negative; No cough, wheezing, shortness of breath, hemoptysis Cardiovascular: See HPI:  GI: positive for GERD; No nausea, vomiting, diarrhea, or abdominal pain GU: Negative; No dysuria, hematuria, or difficulty voiding Musculoskeletal: degenerative changes to her cervical spine.  Arthritis;  Hematologic: Negative; no easy bruising, bleeding Endocrine: positive for diabetes mellitus Neuro: Negative; no changes in balance, headaches Skin: Negative; No rashes or skin lesions Psychiatric: Negative; No behavioral problems, depression Sleep: Negative; No snoring,  daytime sleepiness, hypersomnolence, bruxism, restless legs, hypnogognic hallucinations. Other comprehensive 14 point system review is negative   Physical Exam BP 148/63 mmHg  Pulse 59  Ht '5\' 5"'$  (1.651 m)  Wt 171 lb 9.6 oz (77.837 kg)  BMI 28.56 kg/m2  Repeat blood pressure by me was 138/88  Wt Readings from Last 3 Encounters:  01/11/16 171 lb 9.6 oz (77.837 kg)  12/28/15 158 lb (71.668 kg)  12/26/15 169 lb (76.658 kg)   General: Alert, oriented, no distress.  Skin: normal turgor, no rashes, warm and dry HEENT: Normocephalic, atraumatic. Pupils equal round and reactive to light; sclera anicteric; extraocular muscles intact, No lid lag; Nose  without nasal septal hypertrophy; Mouth/Parynx benign; Mallinpatti scale 3 Neck: No JVD, no  carotid bruits; normal carotid upstroke Lungs: clear to ausculatation and percussion bilaterally; no wheezing or rales, normal inspiratory and expiratory effort Chest wall: without tenderness to palpitation Heart: PMI not displaced, RRR, s1 s2 normal, 1/6 systolic murmur, No diastolic murmur, no rubs, gallops, thrills, or heaves Abdomen: soft, nontender; no hepatosplenomehaly, BS+; abdominal aorta nontender and not dilated by palpation. Back: no CVA tenderness Pulses: 2+  Musculoskeletal: full range of motion, normal strength, no joint deformities Extremities: Pulses 2+, no clubbing cyanosis or edema, Homan's sign negative  Neurologic: grossly nonfocal; Cranial nerves grossly wnl Psychologic: Normal mood and affect  ECG (independently read by me): Normal sinus rhythm at 76 bpm.  LVH by voltage criteria.  January 2017 ECG (independently read by me):  Normal sinus rhythm at 73 bpm.  LVH.  Nonspecific T changes.  ECG (independently read by me): Normal sinus rhythm at 68 bpm.  H by voltage.  T-wave abnormality in leads 1 and aVL.  January 2016 ECG (independently read by me): Normal sinus rhythm at 68 bpm.  LVH by voltage in aVL.  Nonspecific T changes.  November 2015 ECG (independently read by me): sinus bradycardia 59 bpm.  LVH with repolarization changes.  Nonspecific T changes.   LABS: BMP Latest Ref Rng 01/01/2016 12/06/2015 11/17/2015  Glucose 65 - 99 mg/dL 173(H) 264(H) 179(H)  BUN 7 - 25 mg/dL 33(H) 43(H) 26(H)  Creatinine 0.60 - 0.88 mg/dL 1.60(H) 1.72(H) 1.46(H)  Sodium 135 - 146 mmol/L 137 139 138  Potassium 3.5 - 5.3 mmol/L 4.2 4.2 3.4(L)  Chloride 98 - 110 mmol/L 101 98 102  CO2 20 - 31 mmol/L 23 33(H) 26  Calcium 8.6 - 10.4 mg/dL 9.2 9.8 8.7(L)   Hepatic Function Latest Ref Rng 11/14/2015 10/30/2015 08/30/2015  Total Protein 6.5 - 8.1 g/dL 7.1 6.4(L) 6.9  Albumin 3.5 - 5.0 g/dL 3.3(L)  3.1(L) 3.8  AST 15 - 41 U/L '27 16 13  '$ ALT 14 - 54 U/L 21 11(L) 8  Alk Phosphatase 38 - 126 U/L 82 71 66  Total Bilirubin 0.3 - 1.2 mg/dL 0.5 0.8 0.4   CBC Latest Ref Rng 11/16/2015 11/14/2015 10/30/2015  WBC 4.0 - 10.5 K/uL 6.5 11.2(H) 5.6  Hemoglobin 12.0 - 15.0 g/dL 11.3(L) 12.2 11.7(L)  Hematocrit 36.0 - 46.0 % 34.8(L) 37.1 34.8(L)  Platelets 150 - 400 K/uL 167 186 185   Lab Results  Component Value Date   MCV 89.9 11/16/2015   MCV 91.4 11/14/2015   MCV 91.1 10/30/2015   Lab Results  Component Value Date   TSH 1.56 10/12/2014   Lab Results  Component Value Date   HGBA1C 8.3* 10/29/2015   BNP (last 3 results)  Recent Labs  05/24/15 1018 11/14/15 2300 11/17/15 0350  BNP 424.0* 515.6* 196.9*    ProBNP (last 3 results)  Recent Labs  01/01/16 1623  PROBNP 1963.00*    Lipid Panel     Component Value Date/Time   CHOL 230* 11/16/2015 0240   TRIG 97 11/16/2015 0240   HDL 68 11/16/2015 0240   CHOLHDL 3.4 11/16/2015 0240   VLDL 19 11/16/2015 0240   LDLCALC 143* 11/16/2015 0240   LDLDIRECT 180.7 12/09/2011 1109    RADIOLOGY: No results found.    ASSESSMENT AND PLAN: Mrs. Amezcua  is an 82 year-old female who suffered a left circumflex MI in 2011 and underwent tandem stenting of her proximal circumflex vessel.  Cardiac catheterization on 06/08/2014 showed moderate LV dysfunction with moderate multivessel CAD but widely patent tandem stents  in the circumflex vessel.  An echo Doppler study in June 2016 showed an ejection fraction at 30-35% with severe LVH.  There was grade 1 diastolic dysfunction and she had abnormal tissue Doppler suggesting high ventricular filling pressure;  mild aortic insufficiency and mitral annular calcification.  She has had several readmissions with acute on chronic combined systolic and diastolic heart failure.  Presently, she has been taking interest oh 49/51 mg twice a day, furosemide 40 mg twice a day on Monday, Wednesday and Friday and  40 mg daily on other days, and carvedilol 12.5 mg daily instead of 6.25 twice a day.  She is diabetic on metformin and has GERD on omeprazole.  Her most recent pro BNP is elevated.  Serum creatinine was 1.6, which places her with a creatinine clearance in the mid 30s.  For this reason, I do not feel she is a candidate to further titrate and chest L2 97/103.  I will I will keep her on the present dose. With her LV dysfunction I will add Lanoxin at 0.0625 mg daily.  I have also recommended she change her carvedilol to 6.25 mg twice a day.  Her blood pressure today is stable.  I will see her back in the office in 2-3 months for reevaluation.  Time spent: 25 minutes  Troy Sine, MD, [facility]  01/13/2016 1:22 PM

## 2016-01-17 DIAGNOSIS — G4733 Obstructive sleep apnea (adult) (pediatric): Secondary | ICD-10-CM | POA: Diagnosis not present

## 2016-01-17 DIAGNOSIS — I255 Ischemic cardiomyopathy: Secondary | ICD-10-CM | POA: Diagnosis not present

## 2016-01-17 DIAGNOSIS — N183 Chronic kidney disease, stage 3 (moderate): Secondary | ICD-10-CM | POA: Diagnosis not present

## 2016-01-17 DIAGNOSIS — E119 Type 2 diabetes mellitus without complications: Secondary | ICD-10-CM | POA: Diagnosis not present

## 2016-01-17 DIAGNOSIS — G309 Alzheimer's disease, unspecified: Secondary | ICD-10-CM | POA: Diagnosis not present

## 2016-01-17 DIAGNOSIS — I2510 Atherosclerotic heart disease of native coronary artery without angina pectoris: Secondary | ICD-10-CM | POA: Diagnosis not present

## 2016-01-17 DIAGNOSIS — I5043 Acute on chronic combined systolic (congestive) and diastolic (congestive) heart failure: Secondary | ICD-10-CM | POA: Diagnosis not present

## 2016-01-17 DIAGNOSIS — F0280 Dementia in other diseases classified elsewhere without behavioral disturbance: Secondary | ICD-10-CM | POA: Diagnosis not present

## 2016-01-17 DIAGNOSIS — I130 Hypertensive heart and chronic kidney disease with heart failure and stage 1 through stage 4 chronic kidney disease, or unspecified chronic kidney disease: Secondary | ICD-10-CM | POA: Diagnosis not present

## 2016-01-19 ENCOUNTER — Other Ambulatory Visit: Payer: Self-pay | Admitting: Pharmacist

## 2016-01-19 NOTE — Patient Outreach (Signed)
[facility]) Care Management  01/19/2016  HARU MEINECKE March 21, 1926 [id number]   Brantley Dockter is a 82 yo who was referred to [facility] for medication adherence.  Phone call to Mrs. Wenrich.  Verified name and date of birth.  Set up an initial home visit for 01/24/16 at 9:00 AM.     Elisabeth Most, Pharm.D. Pharmacy Resident [place] [phone]

## 2016-01-24 ENCOUNTER — Other Ambulatory Visit: Payer: Self-pay | Admitting: Pharmacist

## 2016-01-24 ENCOUNTER — Encounter: Payer: Self-pay | Admitting: Pharmacist

## 2016-01-24 VITALS — BP 127/63 | HR 59 | Wt 166.0 lb

## 2016-01-24 DIAGNOSIS — Z7189 Other specified counseling: Secondary | ICD-10-CM

## 2016-01-24 NOTE — Patient Outreach (Signed)
[facility]) Care Management  [place]   01/24/2016  Lailee Hoelzel [facility] September 10, 1925 [id number]  Subjective: Mary Cook is an 82 yo who was referred to [facility] for medication adherence.  Completed initial home visit today.  Patient's husband, Erykah Lippert, was also present during visit.    Explained purpose of home visit was to review medications and discuss methods to help improve adherence.  [name] admits she has difficulty remembering to take her medications.  Patient has a weekly pill box filled by her friend, [name].  [name] most recently filled patient's pill boxes on 01/19/16.  Since that time, patient has only missed her evening medications once.  [name] states he tries to help patient remember to take her medications, but he is also forgetful sometimes.    Objective:  Blood glucose = 150 mg/dL  Encounter Medications: Outpatient Encounter Prescriptions as of 01/24/2016  Medication Sig Note  . ACCU-CHEK AVIVA PLUS test strip 1 each by Other route 2 (two) times daily. Dx: E11.9, I 10   . ACCU-CHEK SOFTCLIX LANCETS lancets 1 each by Other route 2 (two) times daily. Dx: E11.9, I 10   . acetaminophen (TYLENOL) 325 MG tablet Take 2 tablets (650 mg total) by mouth every 4 (four) hours as needed for headache or mild pain. 01/24/2016: Taking as needed. Takes a couple of times week   . aspirin 81 MG EC tablet Take 81 mg by mouth daily.     . Blood Glucose Monitoring Suppl (ACCU-CHEK AVIVA PLUS) w/Device KIT 1 each by Does not apply route 2 (two) times daily. Dx: E11.9, I 10   . carvedilol (COREG) 12.5 MG tablet Take 0.5 tablets (6.25 mg total) by mouth 2 (two) times daily with a meal.   . citalopram (CELEXA) 20 MG tablet Take 1 tablet (20 mg total) by mouth daily.   . digoxin (LANOXIN) 0.125 MG tablet Take 0.5 tablets (0.0625 mg total) by mouth daily.   Marland Kitchen donepezil (ARICEPT) 5 MG tablet Take 1 tablet (5 mg total) by mouth at  bedtime.   . furosemide (LASIX) 40 MG tablet Take 1 tablet (40 mg total) by mouth 2 (two) times daily. 12/28/2015: Per caregiver/MD instructed to give patient -1 tablet a day on Monday, Wednesday, Friday and give 1 tablet twice daily on Tuesday, Thursday, Saturday and Sunday.  . metFORMIN (GLUCOPHAGE) 500 MG tablet TAKE 1 TABLET TWICE DAILY BEFORE A MEAL   . omeprazole (PRILOSEC) 20 MG capsule Take 20 mg by mouth daily.   . potassium chloride SA (K-DUR,KLOR-CON) 20 MEQ tablet Take 1 tablet (20 mEq total) by mouth daily.   . sacubitril-valsartan (ENTRESTO) 49-51 MG Take 1 tablet by mouth 2 (two) times daily.   Marland Kitchen UNABLE TO FIND Outpatient PHYSICAL THERAPY  Diagnosis: TIA (transient ischemic attack)   . isosorbide mononitrate (IMDUR) 60 MG 24 hr tablet Take 1 tablet (60 mg total) by mouth daily. (Patient not taking: Reported on 01/24/2016)    No facility-administered encounter medications on file as of 01/24/2016.    Functional Status: In your present state of health, do you have any difficulty performing the following activities: 12/28/2015 12/28/2015  Hearing? - N  Vision? - N  Difficulty concentrating or making decisions? (No Data) Y  Walking or climbing stairs? - Y  Dressing or bathing? - N  Doing errands, shopping? - Y  Preparing Food and eating ? Y N  Using the Toilet? - N  In the past six  months, have you accidently leaked urine? - Y  Do you have problems with loss of bowel control? - N  Managing your Medications? - Y  Managing your Finances? - Y  Housekeeping or managing your Housekeeping? - Y    Fall/Depression Screening: PHQ 2/9 Scores 12/28/2015 12/06/2015 10/28/2014  PHQ - 2 Score 1 4 0  PHQ- 9 Score - 18 -    Assessment: 1.  Medication review:  Drugs sorted by system:  Neurologic/Psychologic: citalopram, donepezil  Cardiovascular: aspirin, carvedilol, digoxin, furosemide, isosorbide mononitrate, potassium, sacubitril-valsartan  Pulmonary/Allergy: none  Gastrointestinal:  omeprazole  Endocrine: metformin  Renal: none  Topical: none  Pain: acetaminophen PRN  Vitamins/Minerals: none  Infectious Diseases: none  Miscellaneous: none   Duplications in therapy: none noted  Gaps in therapy: none noted  Medications to avoid in the elderly: omeprazole (risk of Clostridium difficile infection and bone loss and fractures)  Drug interactions:  - Citalopram and omeprazole - plasma concentrations of citalopram may be increased by concomitant administration of omeprazole - Valsartan and potassium - may cause hyperkalemia (most recent potassium level = 4.2 on 01/01/16)   Other issues noted: Patient is not currently taking isosorbide mononitrate.  Phone call placed to [facility] who reports patient last filled isosorbide on 11/29/15 (30 day supply).  Phone call to patient's cardiology office.  Spoke to [place] who confirmed patient should still be taking isosorbide.  Requested refill of isosorbide from [facility].  Mr. Sterne confirms they will be able to go pick up the prescription today.    2.  Medication adherence:  Patient admits to forgetting to take her medications at time.  Patient is interested in using a medication alarm clock to help remind her to take her medications.  I reviewed purpose and proper use of all medications and provided patient with an updated list of her medications including indication to help remind her what her medications are for.  Counseled patient on importance of taking medications every day as prescribed.  I also provided patient with a medication alarm clock.  Assisted patient in setting medication alarm clock and taught her and Mr. Furber how to use the device.     Plan: 1.  Medication review:  I will send a letter to patient's PCP to consider changing omeprazole to a PPI such as ranitidine for reflux if clinically indicated.  Noted that this would also avoid the current drug interaction between patient's omeprazole and  citalopram.   2.  Patient and her husband will go pick up isosorbide mononitrate prescription from [facility] and will add the medication to the morning slot in her pill box.   3.  Medication adherence:  Patient will begin using medication alarm clock to assist in reminding her to take her medications.   4.  Advanced directives:  Patient was interested in learning more about advanced directives.  I mailed patient information on advanced directives and send referral for [facility] Licensed Clinical Social Worker.   5.  Follow up home visit scheduled for 02/06/16 at 9:00 AM.   [facility] CM Care Plan Problem One        Most Recent Value   Care Plan Problem One  Medication adherence   Role Documenting the Problem One  Clinical Pharmacist   Care Plan for Problem One  Active   THN CM Short Term Goal #1 (0-30 days)  Patient will report adherence with her medications over the next two weeks per patient and caregiver report.    THN  CM Short Term Goal #1 Start Date  01/24/16   Interventions for Short Term Goal #1  Counseled patient on purpose and proper use of her medications including the importance of taking medications every day as prescribed.  Provided patient with an updated list of her medications and included indication for her medications.  Provided patient with a mediation alarm clock and assisted patient in setting up alarm clock to remind her to take her medications.      Elisabeth Most, Pharm.D. Pharmacy Resident [place] [phone]

## 2016-01-29 ENCOUNTER — Encounter: Payer: Self-pay | Admitting: Licensed Clinical Social Worker

## 2016-01-29 ENCOUNTER — Other Ambulatory Visit: Payer: Self-pay | Admitting: Licensed Clinical Social Worker

## 2016-01-29 NOTE — Patient Outreach (Signed)
Assessment:  CSW received referral on [name]. CSW completed chart review on client on 01/29/16.  Client sees Dr. Alain Marion as her primary care doctor. She has support from her spouse for daily needs of client.Client also has support from friend and caregiver, [name]. Client has given permission for [name], caregiver ,to be contacted to discuss needs of client.  Elisabeth Most has been providing [facility] pharmacy program support for client.  Apolonio Schneiders informed CSW recently that client had asked to receive information on Advanced Directives.  Josepha Pigg, case management assistant, has mailed Advanced Directives packet to client recently.  [name], caregiver and friend of client, is managing medications for client. Client has memory loss. Client has had history of falls.  Client uses cane to help her with ambulation. Client has [employer].  Client and her spouse have no children.  CSW called Gillian Shields, spouse of client, on 01/29/16. CSW verified identity of Lilith Lesiak. CSW received verbal permission from [place] on 01/29/16 for CSW to speak with Clance Boll about the current needs of client.  CSW and Clance Boll completed needed [facility] assessments for client. CSW and Clance Boll spoke of client care plan. CSW encouraged that client review advanced directives materials mailed to client, with assistance of her spouse. CSW encouraged that client and spouse discuss advanced directives wishes of client.  CSW provided [facility] CSW phone number of [phone] to [place]. CSW encouraged that client or Ashala Hinz or [name] call CSW at [phone] to discuss social work needs of client.     Plan:  Client will review Advanced Directives materials provided with her spouse in the next 30 days. CSW to call client/Claude Eblen/[name] in 2 weeks to assess needs of client.   Norva Riffle.Jennessa Trigo MSW, LCSW Licensed Clinical Social Worker [facility] Care Management [phone]

## 2016-01-30 ENCOUNTER — Telehealth: Payer: Self-pay | Admitting: Cardiovascular Disease

## 2016-01-30 NOTE — Telephone Encounter (Signed)
New Message   Mary Cook calling in search on an interim order that was faxed on 01/23/2016. She is simply calling to follow up on the form.  Please fax back to: [phone]  Phone # [phone] ext [phone]

## 2016-01-30 NOTE — Telephone Encounter (Signed)
Message routed to [place], [place] for follow up on [facility] form.

## 2016-02-01 ENCOUNTER — Other Ambulatory Visit: Payer: Self-pay | Admitting: *Deleted

## 2016-02-01 NOTE — Patient Outreach (Signed)
[facility]) Care Management  02/01/2016  Mary Cook [facility] [birth year] [id number]  Follow up call to caregiver/learner-Mary Cook who prepares patient's medications.  Learner/caregiver advised that patient has shown some improvement with taking medications but is still missing some of evening doses when she checks medication box. States patient is suppose to take extra dose of lasix @ 3 PM on Mon, Wed., and Friday as instructed by MD.  States she has missed several doses of this medicine in last 2 weeks.  Learner/caregiver states she will continue to set up patient's medications & monitor patient's compliance with taking medication as prescribed by doctors.  Advised learner that case has been referred to pharmacy who is following case and using other strategies.  Care plan updated to reflect that long term goal of  medication compliance within 31 days was not met.    Intervention includes referral to pharmacy to start new strategies and extend long term goal on med adherence. See care plan. RN CM collaborating with pharmacy.  Plan; Follow up with pharmacy.  Sherrin Daisy, RN BSN [place] Management Coordinator [facility] Care Management  [phone]

## 2016-02-06 ENCOUNTER — Other Ambulatory Visit: Payer: Self-pay | Admitting: Pharmacist

## 2016-02-06 ENCOUNTER — Encounter: Payer: Self-pay | Admitting: Pharmacist

## 2016-02-06 NOTE — Patient Outreach (Signed)
[name]) Care Management  [place]   02/06/2016  [name] [name] March 18, 1926 [id number]  Subjective: Mary Cook is an 82 yo who was referred to [name] for medication adherence.  Follow up home visit today to reassess medication adherence.  Patient's husband, [name], was also present during visit.    Patient and her husband both report that the medication alarm clock has helped improve adherence significantly.  Patient's husband reports some confusion as to why there are three alarms set on the alarm clock.  Alarm clock is currently set to go off at 9 AM for patient's morning medications, at 3 PM for furosemide (on Monday, Wednesday, and Friday), and at 9 PM for patient's evening medications.  [name] reports he was confused about the 3 PM alarm and has been giving patient her evening medications at that time.    Patient continues to have two weekly pill boxes filled by her friend, [name].  Current pill boxes have a morning and evening slot.  [name] is unable to put 3PM dose of furosemide in pill boxes since there is not a mid-day slot.  [name] places extra furosemide tables in a bag beside pill boxes for the patient to take at [name] on Monday, Wednesday, and Friday.    [name] most recently filled patient's pill boxes on 02/01/16.  Since that time, patient has not missed any doses of her medications.  I spoke to [place] during visit and [name] reports that when she filled pill boxes last week patient had only missed 1 evening dose of her medication in the two weeks prior.    Objective:   Encounter Medications: Outpatient Encounter Prescriptions as of 02/06/2016  Medication Sig Note  . ACCU-CHEK AVIVA PLUS test strip 1 each by Other route 2 (two) times daily. Dx: E11.9, I 10   . ACCU-CHEK SOFTCLIX LANCETS lancets 1 each by Other route 2 (two) times daily. Dx: E11.9, I 10   . acetaminophen (TYLENOL) 325 MG tablet Take 2 tablets (650 mg total)  by mouth every 4 (four) hours as needed for headache or mild pain. 01/24/2016: Taking as needed. Takes a couple of times week   . aspirin 81 MG EC tablet Take 81 mg by mouth daily.     . Blood Glucose Monitoring Suppl (ACCU-CHEK AVIVA PLUS) w/Device KIT 1 each by Does not apply route 2 (two) times daily. Dx: E11.9, I 10   . carvedilol (COREG) 12.5 MG tablet Take 0.5 tablets (6.25 mg total) by mouth 2 (two) times daily with a meal.   . citalopram (CELEXA) 20 MG tablet Take 1 tablet (20 mg total) by mouth daily.   . digoxin (LANOXIN) 0.125 MG tablet Take 0.5 tablets (0.0625 mg total) by mouth daily.   Marland Kitchen donepezil (ARICEPT) 5 MG tablet Take 1 tablet (5 mg total) by mouth at bedtime.   . furosemide (LASIX) 40 MG tablet Take 1 tablet (40 mg total) by mouth 2 (two) times daily. 02/06/2016: Patient takes 40 mg daily except on Monday, Wednesday, and Friday when she takes 40 mg BID as instructed during cardiology visit   . isosorbide mononitrate (IMDUR) 60 MG 24 hr tablet Take 1 tablet (60 mg total) by mouth daily.   . metFORMIN (GLUCOPHAGE) 500 MG tablet TAKE 1 TABLET TWICE DAILY BEFORE A MEAL   . omeprazole (PRILOSEC) 20 MG capsule Take 20 mg by mouth daily.   . potassium chloride SA (K-DUR,KLOR-CON) 20 MEQ tablet Take 1  tablet (20 mEq total) by mouth daily.   . sacubitril-valsartan (ENTRESTO) 49-51 MG Take 1 tablet by mouth 2 (two) times daily.   Marland Kitchen UNABLE TO FIND Outpatient PHYSICAL THERAPY  Diagnosis: TIA (transient ischemic attack)    No facility-administered encounter medications on file as of 02/06/2016.    Functional Status: In your present state of health, do you have any difficulty performing the following activities: 01/29/2016 01/29/2016  Hearing? [name]  Vision? N N  Difficulty concentrating or making decisions? [name]  Walking or climbing stairs? Y Y  Dressing or bathing? N N  Doing errands, shopping? [name]  Preparing Food and eating ? N N  Using the Toilet? N N  In the past six months, have you  accidently leaked urine? N N  Do you have problems with loss of bowel control? N N  Managing your Medications? Y Y  Managing your Finances? [name]  Housekeeping or managing your Housekeeping? [name]    Fall/Depression Screening: PHQ 2/9 Scores 01/29/2016 12/28/2015 12/06/2015 10/28/2014  PHQ - 2 Score '2 1 4 '$ 0  PHQ- 9 Score 5 - 18 -    Assessment: 1.  Medication adherence:  Adherence has improved with the use of the medication alarm clock.  I reviewed with patient and her husband that 9 AM alarm is for her morning medications, 3 PM alarm is for her second dose of furosemide on Monday, Wednesday, and Friday, and the 9 PM alarm is for her evening medications.  Patient has currently been taking second dose of furosemide along with her morning medications.  I provided patient with a new pill box that has morning, noon, evening, and bedtime slots.  Assisted patient in transferring medications from old pill boxes to new pill boxes and added 3PM dose of furosemide to the noon slot on Monday, Wednesday, and Friday.  Labeled the noon slot with the dosing time of 3 PM.  Spoke to patient's friend [name] who fills the pill boxes for patient and discussed new pill boxes.  [name] reports she thinks this will help patient with adherence of furosemide.    2.  Patient's husband inquired about where to get a new blood pressure cuff.  I spoke to [name] representative who confirmed that patient is eligible for a $30 OTC credit every 3 months.  I will mail patient information on how to order OTC products from [facility].    Plan: 1.  Patient will continue to use medication alarm clock to assist in reminding her to take her medications.   2.  Patient will use new pill boxes for her medications. [name] will continue to fill pill boxes for patient.   3.  Follow up home visit scheduled for 02/23/16 at 9:00 AM.    [facility] CM Care Plan Problem One        Most Recent Value   Care Plan Problem One  Medication adherence   Role  Documenting the Problem One  Clinical Pharmacist   Care Plan for Problem One  Active   THN CM Short Term Goal #1 (0-30 days)  Patient will report adherence with her medications over the next two weeks per patient and caregiver report.    THN CM Short Term Goal #1 Start Date  02/06/16 [Goal renewed]   Interventions for Short Term Goal #1  Reviewed alarms on medication alarm clock with patient.  Also provided patient with a new pill box with morning, noon, evening, and bedtime slots and  assisted patient in transferring medications to the new pill box.  I added furosemide in the noon slot on Monday, Wednesday, and Friday and labeled box with the dosing time of 3 PM.      Elisabeth Most, Pharm.D. Pharmacy Resident [place] [phone]

## 2016-02-07 NOTE — Telephone Encounter (Signed)
This has been returned.

## 2016-02-12 ENCOUNTER — Other Ambulatory Visit: Payer: Self-pay | Admitting: Licensed Clinical Social Worker

## 2016-02-12 NOTE — Patient Outreach (Signed)
Assessment:  CSW left phone message for Jorah Furse on 02/12/16 requesting that Clance Boll please return call to [place] at [phone] to discuss advanced directives material mailed recently to his wife.   CSW also spoke via phone on 02/12/16 with [name], friend and caretaker for client. CSW verified identity of [name].  [name] and CSW spoke of client needs. [name] said that client had received in the mail a copy of advanced directives materials. She said that Clance Boll and client and she had gone over this packet of [employer]. She said she had encouraged client to think about her wishes for Advanced Directives. CSW spoke with [name] about [place] of Attorney Document and Living Will document. CSW informed [name] that either document had to be notarized and signed by two witnesses as part of completion process. CSW encouraged [name] or Clance Boll or client to call CSW at [phone] as needed to discuss advanced directives completion for client. Client has memory loss. Client has Alzheimer's Disease.  She also has ambulation challenges. She uses a cane to help her ambulate. She and her spouse enjoy going out into the community for meals and to shop.  Her spouse continues to drive at present. Client sees Dr. Alain Marion for primary care. [name] is caregiver and friend of client. Client has previously given verbal permission for communication between [facility] staff and [name], caregiver of client.  CSW thanked [name] for phone conversation with CSW on 02/12/16.  [name] was appreciative of call from [place] on 02/12/16.   Plan:  Client to review advanced directives materials mailed to client, with assistance from her spouse, in the next 30 days. CSW to call client/Claude Bajaj/[name] [name] in 4 weeks to assess needs of client at that time.  Norva Riffle.Kippy Melena MSW, LCSW Licensed Clinical Social Worker [facility] Care Management [phone]

## 2016-02-16 ENCOUNTER — Other Ambulatory Visit: Payer: Self-pay | Admitting: Internal Medicine

## 2016-02-22 ENCOUNTER — Other Ambulatory Visit: Payer: Self-pay

## 2016-02-22 MED ORDER — DIGOXIN 125 MCG PO TABS: 0.0625 mg | ORAL_TABLET | Freq: Every day | ORAL | Status: DC

## 2016-02-22 MED ORDER — ISOSORBIDE MONONITRATE ER 60 MG PO TB24: 60.0000 mg | ORAL_TABLET | Freq: Every day | ORAL | Status: DC

## 2016-02-23 ENCOUNTER — Other Ambulatory Visit: Payer: Self-pay | Admitting: Pharmacist

## 2016-02-23 NOTE — Patient Outreach (Addendum)
[facility]) Care Management  Mary Cook   02/23/2016  Mary Cook 06-02-1926 [id number]  Subjective: Mary Cook is an 82 yo who was referred to [place] for medication adherence.  Follow up home visit today to reassess medication adherence.  Patient's husband, Mary Cook, was also present during visit.    Patient and her husband both report that the medication alarm clock has helped improve adherence significantly.  Alarm clock is set to go off at 9 AM for patient's morning medications, at 3 PM for furosemide dose (on Monday, Wednesday, and Friday), and at 9 PM for patient's evening medications.  Patient reports she has not missed any doses of her medications.    Patient continues to have two weekly pill boxes filled by her friend, Mary Cook.  Patient is now using the two weekly pill boxes I provided her with on 02/06/16 visit.  Her pill boxes have morning, noon (relabeled as 3 PM), evening, and bedtime slots.  [name] most recently filled patient's pill boxes on 02/20/16.    Patient endorses episodes of feeling "swimmy headed."    Objective:  Blood pressure = 135/67 (sitting); 145/69 (standing)  Encounter Medications: Outpatient Encounter Prescriptions as of 02/23/2016  Medication Sig Note  . ACCU-CHEK AVIVA PLUS test strip 1 each by Other route 2 (two) times daily. Dx: E11.9, I 10   . ACCU-CHEK SOFTCLIX LANCETS lancets 1 each by Other route 2 (two) times daily. Dx: E11.9, I 10   . aspirin 81 MG EC tablet Take 81 mg by mouth daily.     . Blood Glucose Monitoring Suppl (ACCU-CHEK AVIVA PLUS) w/Device KIT 1 each by Does not apply route 2 (two) times daily. Dx: E11.9, I 10   . carvedilol (COREG) 12.5 MG tablet Take 0.5 tablets (6.25 mg total) by mouth 2 (two) times daily with a meal.   . citalopram (CELEXA) 20 MG tablet Take 1 tablet (20 mg total) by mouth daily.   . digoxin (LANOXIN) 0.125 MG tablet Take 0.5 tablets (0.0625 mg total) by  mouth daily.   Marland Kitchen donepezil (ARICEPT) 5 MG tablet Take 1 tablet (5 mg total) by mouth at bedtime.   . isosorbide mononitrate (IMDUR) 60 MG 24 hr tablet Take 1 tablet (60 mg total) by mouth daily.   . metFORMIN (GLUCOPHAGE) 500 MG tablet TAKE 1 TABLET TWICE DAILY BEFORE A MEAL   . omeprazole (PRILOSEC) 20 MG capsule Take 20 mg by mouth daily.   . potassium chloride SA (K-DUR,KLOR-CON) 20 MEQ tablet Take 1 tablet (20 mEq total) by mouth daily.   . sacubitril-valsartan (ENTRESTO) 49-51 MG Take 1 tablet by mouth 2 (two) times daily.   Marland Kitchen UNABLE TO FIND Outpatient PHYSICAL THERAPY  Diagnosis: TIA (transient ischemic attack)   . furosemide (LASIX) 40 MG tablet TAKE 1 TABLET TWICE A DAY   . [DISCONTINUED] acetaminophen (TYLENOL) 325 MG tablet Take 2 tablets (650 mg total) by mouth every 4 (four) hours as needed for headache or mild pain. (Patient not taking: Reported on 02/23/2016) 01/24/2016: Taking as needed. Takes a couple of times week    No facility-administered encounter medications on file as of 02/23/2016.   Functional Status: In your present state of health, do you have any difficulty performing the following activities: 01/29/2016 01/29/2016  Hearing? [name]  Vision? N N  Difficulty concentrating or making decisions? [name]  Walking or climbing stairs? Y Y  Dressing or bathing? N N  Doing errands, shopping?  [name]  Preparing Food and eating ? N N  Using the Toilet? N N  In the past six months, have you accidently leaked urine? N N  Do you have problems with loss of bowel control? N N  Managing your Medications? Y Y  Managing your Finances? [name]  Housekeeping or managing your Housekeeping? [name]   Fall/Depression Screening: PHQ 2/9 Scores 02/12/2016 01/29/2016 12/28/2015 12/06/2015 10/28/2014  PHQ - 2 Score '2 2 1 4 '$ 0  PHQ- 9 Score 5 5 - 18 -    Assessment: 1.  Medication adherence: Patient reports adherence with her medications and denies missing any doses with the use of the medication alarm clock.     2.  Medication management:  Reviewed patient's pill boxes and noted several medication discrepancies.  Donepezil had been placed in pill box twice a day instead of daily as prescribed, and furosemide had also been placed in pill box twice a day in the morning and at 3 PM every day instead of daily in the morning and at 3 PM on Monday, Wednesday, and Friday as prescribed.  Medication planners were most recently refilled two days prior to my visit.  I assisted patient in correcting pill boxes.  Provided patient with a chart of her medications listing which medications should be taken in the morning, which medications should be taken at 3 PM and which medications should be taken in the evening.    3.  Acute complaint:  Patient endorses episodes of feeling "swimmy headed" and reports they she has had an increase in "falls" where she attempts to stand up but has to sit back down on her chair.  Patient denies any falls where she has hit her head and denies any injury from falls.  Assessed patient's blood pressure both sitting and standing.  Patient did not demonstrate orthostatic hypotension.  Patient denies feeling swimmy headed at this time.  Patient is scheduled to see her PCP on 03/12/16.  I called patient's PCP to determine if patient could be seen sooner.  Patient rescheduled for 03/05/16 which was provider's first available appointment.  Advised patient to go to urgent care if symptoms worsened.  Patient voiced understanding.    Plan: 1.  Patient will continue to use medication alarm clock to assist in reminding her to take her medications.   2.  Patient will continue to use weekly pill boxes for her medications.  Chart provided to patient to help with filling the weekly pill boxes.   3.  Follow up home visit scheduled for 03/06/16.    Elisabeth Most, Pharm.D. Pharmacy Resident [place] [phone]

## 2016-02-28 ENCOUNTER — Other Ambulatory Visit: Payer: Self-pay | Admitting: Cardiovascular Disease

## 2016-02-28 MED ORDER — ISOSORBIDE MONONITRATE ER 60 MG PO TB24: 60.0000 mg | ORAL_TABLET | Freq: Every day | ORAL | Status: DC

## 2016-02-28 MED ORDER — DIGOXIN 125 MCG PO TABS: 0.0625 mg | ORAL_TABLET | Freq: Every day | ORAL | Status: DC

## 2016-02-28 NOTE — Telephone Encounter (Signed)
[facility] ) Calling about the Digoxin 125 mcg,and Isosorbide ER 60mg   That was faxed over on June 26th for a refill and they have not heard anything back as of yet . Please call   Thanks

## 2016-02-28 NOTE — Telephone Encounter (Signed)
SPOKE TO PHARMACIST( LEAH) AT MAIL ORDER  VERBAL ORDER FOR REFILL DIGOXIN 0.125 MG TAKE 1/2 BY MOUTH IMDUR  ER 60 MG DAILY VERBAL ORDER FOR 90 DAY SUPPLY WITH 3 REFILLS

## 2016-03-05 ENCOUNTER — Ambulatory Visit: Payer: Commercial Managed Care - HMO | Admitting: Internal Medicine

## 2016-03-05 ENCOUNTER — Other Ambulatory Visit: Payer: Self-pay | Admitting: *Deleted

## 2016-03-05 MED ORDER — DIGOXIN 125 MCG PO TABS: 0.0625 mg | ORAL_TABLET | Freq: Every day | ORAL | Status: DC

## 2016-03-05 MED ORDER — ISOSORBIDE MONONITRATE ER 60 MG PO TB24: 60.0000 mg | ORAL_TABLET | Freq: Every day | ORAL | Status: DC

## 2016-03-06 ENCOUNTER — Other Ambulatory Visit: Payer: Self-pay | Admitting: Pharmacist

## 2016-03-06 ENCOUNTER — Other Ambulatory Visit: Payer: Self-pay | Admitting: Internal Medicine

## 2016-03-07 NOTE — Patient Outreach (Signed)
Mary Cook) Care Management  Mary Cook   03/07/2016  Mary Cook 09-Aug-1926 [id number]  Subjective:  Patient is an 82 year old female who was initially referred to [place] for medication adherence and was followed by [place] Resident Apolonio Schneiders.  Patient was seen for a follow-up home visit on 03/06/16 for assessment of medication adherence.  Patient's spouse, Clance Boll, was also present during visit.    Patient and her husband report that friend, [name], who fills her pill boxes has been out of town.  They state spouse filled pill planners this time and [name] should return on 03/09/16.    Spouse and patient state that they didn't realize patient's appointment with PCP was on 03/05/16 and they missed it.    Patient's pill boxes have morning, noon (labeled as 3 pm), evening, and bedtime slots.    Objective:   Encounter Medications: Outpatient Encounter Prescriptions as of 03/06/2016  Medication Sig Note  . aspirin 81 MG EC tablet Take 81 mg by mouth daily.     . carvedilol (COREG) 12.5 MG tablet Take 0.5 tablets (6.25 mg total) by mouth 2 (two) times daily with a meal.   . citalopram (CELEXA) 20 MG tablet Take 1 tablet (20 mg total) by mouth daily.   . digoxin (LANOXIN) 0.125 MG tablet Take 0.5 tablets (0.0625 mg total) by mouth daily.   Marland Kitchen donepezil (ARICEPT) 5 MG tablet TAKE 1 TABLET AT BEDTIME   . furosemide (LASIX) 40 MG tablet TAKE 1 TABLET TWICE A DAY 03/07/2016: Patient takes 40 mg daily except on Monday, Wednesday, Friday takes 40 mg twice daily per cardiology 01/13/16 office note.    . isosorbide mononitrate (IMDUR) 60 MG 24 hr tablet Take 1 tablet (60 mg total) by mouth daily.   Marland Kitchen KLOR-CON M20 20 MEQ tablet TAKE 1 TABLET EVERY DAY   . metFORMIN (GLUCOPHAGE) 500 MG tablet TAKE 1 TABLET TWICE A DAY   . omeprazole (PRILOSEC) 20 MG capsule Take 20 mg by mouth daily.   . sacubitril-valsartan (ENTRESTO) 49-51 MG Take 1 tablet by mouth 2 (two) times daily.    Marland Kitchen ACCU-CHEK AVIVA PLUS test strip 1 each by Other route 2 (two) times daily. Dx: E11.9, I 10   . ACCU-CHEK SOFTCLIX LANCETS lancets 1 each by Other route 2 (two) times daily. Dx: E11.9, I 10   . Blood Glucose Monitoring Suppl (ACCU-CHEK AVIVA PLUS) w/Device KIT 1 each by Does not apply route 2 (two) times daily. Dx: E11.9, I 10   . UNABLE TO FIND Outpatient PHYSICAL THERAPY  Diagnosis: TIA (transient ischemic attack)    No facility-administered encounter medications on file as of 03/06/2016.    Functional Status: In your present state of health, do you have any difficulty performing the following activities: 01/29/2016 01/29/2016  Hearing? [name]  Vision? N N  Difficulty concentrating or making decisions? [name]  Walking or climbing stairs? Y Y  Dressing or bathing? N N  Doing errands, shopping? [name]  Preparing Food and eating ? N N  Using the Toilet? N N  In the past six months, have you accidently leaked urine? N N  Do you have problems with loss of bowel control? N N  Managing your Medications? Y Y  Managing your Finances? [name]  Housekeeping or managing your Housekeeping? [name]    Fall/Depression Screening: PHQ 2/9 Scores 02/12/2016 01/29/2016 12/28/2015 12/06/2015 10/28/2014  PHQ - 2 Score '2 2 1 4 '$ 0  PHQ-  9 Score 5 5 - 18 -    Assessment:  1) Placed call to patient's PCP office at patient and spouse request, during home visit to assist in getting PCP appointment rescheduled---PCP office had first available appointment on 03/15/16---patient and spouse accepted this appointment.    2) Medication adherence:   There was one dose of furosemide left in pill box from Monday which patient and spouse endorse that they forgot.  Patient's pill planners were missing the second dose of furosemide on Monday, Wednesday, Friday, and pill planner was adjusted for this.  Digoxin was not in patient's pill planner.  Spouse reports waiting for refill to come from [facility] Order---pill planner was filled with the  remaining digoxin from current prescription refill, which should last about 10 days.    Plan:  1) Patient and spouse will continue to have use weekly pill planners for medications.  2) Assisted patient and spouse in re-scheduling PCP appointment.   Follow-up home visit scheduled for about 2 weeks.   Karrie Meres, PharmD, [place] [phone]

## 2016-03-08 ENCOUNTER — Other Ambulatory Visit: Payer: Self-pay | Admitting: *Deleted

## 2016-03-08 NOTE — Patient Outreach (Signed)
[facility]) Care Management  03/08/2016  Mary Cook [facility] 24-Nov-1925 [id number]   Telephone call to patient; left message on voice mail;  Plan: Will follow up.  Sherrin Daisy, RN BSN [place] Management Coordinator [facility] Care Management  [phone]

## 2016-03-12 ENCOUNTER — Ambulatory Visit: Payer: Commercial Managed Care - HMO | Admitting: Internal Medicine

## 2016-03-14 ENCOUNTER — Other Ambulatory Visit: Payer: Self-pay | Admitting: Licensed Clinical Social Worker

## 2016-03-14 NOTE — Patient Outreach (Signed)
Assessment:  CSW spoke via phone with Mary Cook, friend and caregiver of client, on 03/14/16. CSW verified identity of Mary Cook. Mary and CSW spoke of client needs. Client has Alzheimer's Disease diagnosis. She has some difficulty remembering to take her medications on schedule. She has a medication alarm that assists in reminding her when to take prescribed medications. She has also been receiving [place] program support.  Client does have her prescribed medications. Client has no transport needs. Her spouse drives client to and from client's scheduled medical appointments. Client sees Dr. Evie Lacks. Plotnikov as her primary care physician. She is trying to attend all scheduled appointments wiith her primary care physician. [name] said that client has appointment with Dr. Alain Marion on 03/15/16.   CSW and [name] spoke of client care plan. CSW encouraged that client attend all scheduled client medical appointments in the next 30 days.  Client has been sleeping well and eating well.  Mary did not mention any pain issues for client. Client sees cardiologist as scheduled.  [name] stated that client wants to continue residing at her home with her spouse.  CSW reminded [name] that she or client can call CSW at [phone] as needed to discuss social work needs of client. CSW thanked [name] for phone conversation with CSW on 03/14/16   Plan:  Client to attend all scheduled client medical appointments in the next 30 days. CSW to call client/Mary Cook in 4 weeks to assess client needs at that time.  Norva Riffle.Suly Vukelich MSW, LCSW Licensed Clinical Social Worker [facility] Care Management [phone]

## 2016-03-15 ENCOUNTER — Ambulatory Visit (INDEPENDENT_AMBULATORY_CARE_PROVIDER_SITE_OTHER): Payer: Commercial Managed Care - HMO | Admitting: Internal Medicine

## 2016-03-15 ENCOUNTER — Other Ambulatory Visit (INDEPENDENT_AMBULATORY_CARE_PROVIDER_SITE_OTHER): Payer: Commercial Managed Care - HMO

## 2016-03-15 ENCOUNTER — Encounter: Payer: Self-pay | Admitting: Internal Medicine

## 2016-03-15 VITALS — BP 120/54 | HR 67 | Wt 168.0 lb

## 2016-03-15 DIAGNOSIS — E1159 Type 2 diabetes mellitus with other circulatory complications: Secondary | ICD-10-CM

## 2016-03-15 DIAGNOSIS — I2510 Atherosclerotic heart disease of native coronary artery without angina pectoris: Secondary | ICD-10-CM | POA: Diagnosis not present

## 2016-03-15 DIAGNOSIS — I255 Ischemic cardiomyopathy: Secondary | ICD-10-CM | POA: Diagnosis not present

## 2016-03-15 DIAGNOSIS — I5043 Acute on chronic combined systolic (congestive) and diastolic (congestive) heart failure: Secondary | ICD-10-CM

## 2016-03-15 DIAGNOSIS — Z9861 Coronary angioplasty status: Secondary | ICD-10-CM

## 2016-03-15 LAB — HEPATIC FUNCTION PANEL
ALBUMIN: 3.6 g/dL (ref 3.5–5.2)
ALT: 8 U/L (ref 0–35)
AST: 8 U/L (ref 0–37)
Alkaline Phosphatase: 80 U/L (ref 39–117)
BILIRUBIN TOTAL: 0.4 mg/dL (ref 0.2–1.2)
Bilirubin, Direct: 0 mg/dL (ref 0.0–0.3)
TOTAL PROTEIN: 7 g/dL (ref 6.0–8.3)

## 2016-03-15 LAB — BASIC METABOLIC PANEL
BUN: 33 mg/dL — AB (ref 6–23)
CHLORIDE: 100 meq/L (ref 96–112)
CO2: 31 meq/L (ref 19–32)
CREATININE: 1.93 mg/dL — AB (ref 0.40–1.20)
Calcium: 9.3 mg/dL (ref 8.4–10.5)
GFR: 26.05 mL/min — ABNORMAL LOW (ref >60.00)
GLUCOSE: 222 mg/dL — AB (ref 70–99)
Potassium: 4.5 mEq/L (ref 3.5–5.1)
Sodium: 138 mEq/L (ref 135–145)

## 2016-03-15 LAB — HEMOGLOBIN A1C: Hgb A1c MFr Bld: 7.3 % — ABNORMAL HIGH (ref 4.6–6.5)

## 2016-03-15 NOTE — Assessment & Plan Note (Signed)
Chronic Back on Metformin

## 2016-03-15 NOTE — Assessment & Plan Note (Addendum)
  On Coreg, Digoxin, Isosorbide, Lasix, Entresto Risks associated with treatment noncompliance were discussed. Compliance was encouraged.

## 2016-03-15 NOTE — Progress Notes (Signed)
Pre visit review using our clinic review tool, if applicable. No additional management support is needed unless otherwise documented below in the visit note. 

## 2016-03-15 NOTE — Assessment & Plan Note (Signed)
On Coreg,  Isosorbide, ASA, Entresto Risks associated with treatment noncompliance were discussed. Compliance was encouraged.

## 2016-03-15 NOTE — Progress Notes (Signed)
Subjective:  Patient ID: Mary Cook, female    DOB: November 12, 1925  Age: 82 y.o. MRN: [id number]  CC: No chief complaint on file.   HPI [employer] presents for CHF, CAD, HTN f/u. C/o weakness. Comes w/[name], her friend  Outpatient Prescriptions Prior to Visit  Medication Sig Dispense Refill  . ACCU-CHEK AVIVA PLUS test strip 1 each by Other route 2 (two) times daily. Dx: E11.9, I 10 100 each 2  . ACCU-CHEK SOFTCLIX LANCETS lancets 1 each by Other route 2 (two) times daily. Dx: E11.9, I 10 100 each 2  . aspirin 81 MG EC tablet Take 81 mg by mouth daily.      . Blood Glucose Monitoring Suppl (ACCU-CHEK AVIVA PLUS) w/Device KIT 1 each by Does not apply route 2 (two) times daily. Dx: E11.9, I 10 1 kit 0  . carvedilol (COREG) 12.5 MG tablet Take 0.5 tablets (6.25 mg total) by mouth 2 (two) times daily with a meal. 90 tablet 3  . citalopram (CELEXA) 20 MG tablet Take 1 tablet (20 mg total) by mouth daily. 90 tablet 2  . digoxin (LANOXIN) 0.125 MG tablet Take 0.5 tablets (0.0625 mg total) by mouth daily. 45 tablet 3  . donepezil (ARICEPT) 5 MG tablet TAKE 1 TABLET AT BEDTIME 90 tablet 2  . furosemide (LASIX) 40 MG tablet TAKE 1 TABLET TWICE A DAY 180 tablet 1  . isosorbide mononitrate (IMDUR) 60 MG 24 hr tablet Take 1 tablet (60 mg total) by mouth daily. 90 tablet 3  . KLOR-CON M20 20 MEQ tablet TAKE 1 TABLET EVERY DAY 90 tablet 2  . metFORMIN (GLUCOPHAGE) 500 MG tablet TAKE 1 TABLET TWICE A DAY 180 tablet 2  . omeprazole (PRILOSEC) 20 MG capsule Take 20 mg by mouth daily.    . sacubitril-valsartan (ENTRESTO) 49-51 MG Take 1 tablet by mouth 2 (two) times daily. 180 tablet 3  . UNABLE TO FIND Outpatient PHYSICAL THERAPY  Diagnosis: TIA (transient ischemic attack) 1 Mutually Defined 0   No facility-administered medications prior to visit.    ROS Review of Systems  Constitutional: Negative for chills, activity change, appetite change, fatigue and unexpected weight change.  HENT:  Negative for congestion, mouth sores and sinus pressure.   Eyes: Negative for visual disturbance.  Respiratory: Positive for shortness of breath. Negative for cough and chest tightness.   Gastrointestinal: Negative for nausea and abdominal pain.  Genitourinary: Negative for frequency, difficulty urinating and vaginal pain.  Musculoskeletal: Negative for back pain and gait problem.  Skin: Negative for pallor and rash.  Neurological: Negative for dizziness, tremors, weakness, numbness and headaches.  Psychiatric/Behavioral: Positive for confusion and decreased concentration. Negative for suicidal ideas and sleep disturbance. The patient is nervous/anxious.     Objective:  BP 120/54 mmHg  Pulse 67  Wt 168 lb (76.204 kg)  SpO2 94%  BP Readings from Last 3 Encounters:  03/15/16 120/54  01/24/16 127/63  01/11/16 148/63    Wt Readings from Last 3 Encounters:  03/15/16 168 lb (76.204 kg)  01/24/16 166 lb (75.297 kg)  01/11/16 171 lb 9.6 oz (77.837 kg)    Physical Exam  Constitutional: She appears well-developed. No distress.  HENT:  Head: Normocephalic.  Right Ear: External ear normal.  Left Ear: External ear normal.  Nose: Nose normal.  Mouth/Throat: Oropharynx is clear and moist.  Eyes: Conjunctivae are normal. Pupils are equal, round, and reactive to light. Right eye exhibits no discharge. Left eye exhibits no discharge.  Neck:  Normal range of motion. Neck supple. No JVD present. No tracheal deviation present. No thyromegaly present.  Cardiovascular: Normal rate, regular rhythm and normal heart sounds.   Pulmonary/Chest: No stridor. No respiratory distress. She has no wheezes.  Abdominal: Soft. Bowel sounds are normal. She exhibits no distension and no mass. There is no tenderness. There is no rebound and no guarding.  Musculoskeletal: She exhibits tenderness. She exhibits no edema.  Lymphadenopathy:    She has no cervical adenopathy.  Neurological: She displays normal  reflexes. No cranial nerve deficit. She exhibits normal muscle tone. Coordination abnormal.  Skin: No rash noted. No erythema.  Psychiatric: She has a normal mood and affect. Her behavior is normal.  Cane Confused   Lab Results  Component Value Date   WBC 6.5 11/16/2015   HGB 11.3* 11/16/2015   HCT 34.8* 11/16/2015   PLT 167 11/16/2015   GLUCOSE 173* 01/01/2016   CHOL 230* 11/16/2015   TRIG 97 11/16/2015   HDL 68 11/16/2015   LDLDIRECT 180.7 12/09/2011   LDLCALC 143* 11/16/2015   ALT 21 11/14/2015   AST 27 11/14/2015   NA 137 01/01/2016   K 4.2 01/01/2016   CL 101 01/01/2016   CREATININE 1.60* 01/01/2016   BUN 33* 01/01/2016   CO2 23 01/01/2016   TSH 1.56 10/12/2014   INR 1.01 11/15/2015   HGBA1C 8.3* 10/29/2015    Dg Chest Port 1 View  11/14/2015  CLINICAL DATA:  82 year old female with shortness of breath EXAM: PORTABLE CHEST 1 VIEW COMPARISON:  Chest CT dated 10/29/2015 FINDINGS: There is mild cardiomegaly with mild increased interstitial prominence which may represent a degree of congestive changes or edema. A small left pleural effusion is noted. There is no pneumothorax. The lungs are otherwise clear. No focal consolidation. There is degenerative changes of the spine and shoulders. No acute osseous pathology. IMPRESSION: Mild cardiomegaly with possible mild congestive changes and a small left pleural effusion. Electronically Signed   By: Anner Crete M.D.   On: 11/14/2015 22:27    Assessment & Plan:   There are no diagnoses linked to this encounter. I am having Ms. Spruiell maintain her aspirin, UNABLE TO FIND, omeprazole, citalopram, sacubitril-valsartan, ACCU-CHEK SOFTCLIX LANCETS, ACCU-CHEK AVIVA PLUS, ACCU-CHEK AVIVA PLUS, carvedilol, furosemide, isosorbide mononitrate, digoxin, KLOR-CON M20, metFORMIN, and donepezil.  No orders of the defined types were placed in this encounter.     Follow-up: No Follow-up on file.  Walker Kehr, MD

## 2016-03-19 ENCOUNTER — Other Ambulatory Visit: Payer: Self-pay | Admitting: Pharmacist

## 2016-03-19 NOTE — Patient Outreach (Signed)
Spouse left [place], Arrie Aran, PharmD a voicemail.  [facility] Pharmacist attempted to return call to spouse, on [facility] consent form.    No answer, HIPAA compliant voicemail was left requesting a call back.    Plan:  Home visit as previously scheduled.    Karrie Meres, PharmD, [place] [phone]

## 2016-03-20 ENCOUNTER — Other Ambulatory Visit: Payer: Self-pay | Admitting: Pharmacist

## 2016-03-20 ENCOUNTER — Other Ambulatory Visit: Payer: Self-pay | Admitting: Internal Medicine

## 2016-03-21 NOTE — Patient Outreach (Signed)
[place] [facility]) Care Management  [place]   03/21/2016  Mary Cook 03/25/1926 [id number]  Subjective:  Patient is an 82 year old female who is followed by [place] for medication adherence.    Follow-up home visit completed with patient and spouse, on [facility] consent form.  Patient was able to accurately state her name, address, and date of birth during visit.    Patient's spouse states that he has been filling patient's pill planner.  Patient has two 7 day pill planners that are used for her medications.   Spouse reports that he tries to fill the medication planners and that friend, [name], has not filled it this week.    Spouse had patient's medication bottles ready for pharmacist to review during home visit as well.    Objective:   Encounter Medications: Outpatient Encounter Prescriptions as of 03/20/2016  Medication Sig Note  . aspirin 81 MG EC tablet Take 81 mg by mouth daily.     . carvedilol (COREG) 12.5 MG tablet Take 0.5 tablets (6.25 mg total) by mouth 2 (two) times daily with a meal.   . citalopram (CELEXA) 20 MG tablet Take 1 tablet (20 mg total) by mouth daily.   . digoxin (LANOXIN) 0.125 MG tablet Take 0.5 tablets (0.0625 mg total) by mouth daily.   Marland Kitchen donepezil (ARICEPT) 5 MG tablet TAKE 1 TABLET AT BEDTIME   . furosemide (LASIX) 40 MG tablet TAKE 1 TABLET TWICE A DAY 03/07/2016: Patient takes 40 mg daily except on Monday, Wednesday, Friday takes 40 mg twice daily per cardiology 01/13/16 office note.    . isosorbide mononitrate (IMDUR) 60 MG 24 hr tablet Take 1 tablet (60 mg total) by mouth daily.   Marland Kitchen KLOR-CON M20 20 MEQ tablet TAKE 1 TABLET EVERY DAY   . metFORMIN (GLUCOPHAGE) 500 MG tablet TAKE 1 TABLET TWICE A DAY   . omeprazole (PRILOSEC) 20 MG capsule Take 20 mg by mouth daily.   . sacubitril-valsartan (ENTRESTO) 49-51 MG Take 1 tablet by mouth 2 (two) times daily.   Marland Kitchen ACCU-CHEK SOFTCLIX LANCETS lancets 1 each by Other route 2 (two)  times daily. Dx: E11.9, I 10   . Blood Glucose Monitoring Suppl (ACCU-CHEK AVIVA PLUS) w/Device KIT 1 each by Does not apply route 2 (two) times daily. Dx: E11.9, I 10   . UNABLE TO FIND Outpatient PHYSICAL THERAPY  Diagnosis: TIA (transient ischemic attack)   . [DISCONTINUED] ACCU-CHEK AVIVA PLUS test strip 1 each by Other route 2 (two) times daily. Dx: E11.9, I 10    No facility-administered encounter medications on file as of 03/20/2016.     Functional Status: In your present state of health, do you have any difficulty performing the following activities: 01/29/2016 01/29/2016  Hearing? [name]  Vision? N N  Difficulty concentrating or making decisions? [name]  Walking or climbing stairs? Y Y  Dressing or bathing? N N  Doing errands, shopping? [name]  Preparing Food and eating ? N N  Using the Toilet? N N  In the past six months, have you accidently leaked urine? N N  Do you have problems with loss of bowel control? N N  Managing your Medications? Y Y  Managing your Finances? [name]  Housekeeping or managing your Housekeeping? [name]  Some recent data might be hidden    Fall/Depression Screening: PHQ 2/9 Scores 03/14/2016 02/12/2016 01/29/2016 12/28/2015 12/06/2015 10/28/2014  PHQ - 2 Score '2 2 2 1 4 '$ 0  PHQ- 9 Score '5 5 5 '$ - 18 -    Assessment:  1) Medication adherence:    Spouse reports that patient has been taking her medications from pill planner---Tuesday pills were in the pill planner and missed.    2) Medication management:  Reviewed both pill planers and noted several discrepancies.  There was an extra dose of isosorbide mononitrate in each of the pill planners---these were difference colors, one was a previous refill from [facility] and one was current refill from [facility].  Extra dose was removed.   There was an extra dose of citalopram in each pill planner.  Extra dose was removed.    One pill planner was missing the furosemide dose.  This was added to pill planner.       Spouse is not interested in alternative methods for filling pill planner such as blister packing of medications.      Plan:  1) Counseled patient and spouse on importance of taking medications as prescribed.   2) Follow-up home visit scheduled for about 2 weeks.    Karrie Meres, PharmD, [place] [phone]

## 2016-03-22 ENCOUNTER — Emergency Department (HOSPITAL_COMMUNITY): Payer: Commercial Managed Care - HMO

## 2016-03-22 ENCOUNTER — Encounter (HOSPITAL_COMMUNITY): Payer: Self-pay | Admitting: *Deleted

## 2016-03-22 ENCOUNTER — Emergency Department (HOSPITAL_COMMUNITY)
Admission: EM | Admit: 2016-03-22 | Discharge: 2016-03-22 | Disposition: A | Discharge status: Home / Self Care | Payer: Commercial Managed Care - HMO | Attending: Emergency Medicine | Admitting: Emergency Medicine

## 2016-03-22 DIAGNOSIS — I2510 Atherosclerotic heart disease of native coronary artery without angina pectoris: Secondary | ICD-10-CM | POA: Diagnosis not present

## 2016-03-22 DIAGNOSIS — Z8673 Personal history of transient ischemic attack (TIA), and cerebral infarction without residual deficits: Secondary | ICD-10-CM | POA: Insufficient documentation

## 2016-03-22 DIAGNOSIS — G309 Alzheimer's disease, unspecified: Secondary | ICD-10-CM | POA: Insufficient documentation

## 2016-03-22 DIAGNOSIS — Z7984 Long term (current) use of oral hypoglycemic drugs: Secondary | ICD-10-CM | POA: Diagnosis not present

## 2016-03-22 DIAGNOSIS — I5042 Chronic combined systolic (congestive) and diastolic (congestive) heart failure: Secondary | ICD-10-CM | POA: Diagnosis not present

## 2016-03-22 DIAGNOSIS — E119 Type 2 diabetes mellitus without complications: Secondary | ICD-10-CM | POA: Insufficient documentation

## 2016-03-22 DIAGNOSIS — R079 Chest pain, unspecified: Secondary | ICD-10-CM | POA: Insufficient documentation

## 2016-03-22 DIAGNOSIS — I110 Hypertensive heart disease with heart failure: Secondary | ICD-10-CM | POA: Diagnosis not present

## 2016-03-22 DIAGNOSIS — Z79899 Other long term (current) drug therapy: Secondary | ICD-10-CM | POA: Insufficient documentation

## 2016-03-22 LAB — I-STAT TROPONIN, ED
Troponin i, poc: 0 ng/mL (ref 0.00–0.08)
Troponin i, poc: 0 ng/mL (ref 0.00–0.08)

## 2016-03-22 LAB — BASIC METABOLIC PANEL
Anion gap: 4 — ABNORMAL LOW (ref 5–15)
BUN: 31 mg/dL — ABNORMAL HIGH (ref 6–20)
CO2: 28 mmol/L (ref 22–32)
Calcium: 8.9 mg/dL (ref 8.9–10.3)
Chloride: 104 mmol/L (ref 101–111)
Creatinine, Ser: 2.31 mg/dL — ABNORMAL HIGH (ref 0.44–1.00)
GFR calc Af Amer: 21 mL/min — ABNORMAL LOW (ref >60)
GFR calc non Af Amer: 18 mL/min — ABNORMAL LOW (ref >60)
Glucose, Bld: 175 mg/dL — ABNORMAL HIGH (ref 65–99)
Potassium: 4.2 mmol/L (ref 3.5–5.1)
Sodium: 136 mmol/L (ref 135–145)

## 2016-03-22 LAB — CBC
HCT: 31.5 % — ABNORMAL LOW (ref 36.0–46.0)
Hemoglobin: 10.6 g/dL — ABNORMAL LOW (ref 12.0–15.0)
MCH: 31.2 pg (ref 26.0–34.0)
MCHC: 33.7 g/dL (ref 30.0–36.0)
MCV: 92.6 fL (ref 78.0–100.0)
Platelets: 196 10*3/uL (ref 150–400)
RBC: 3.4 MIL/uL — ABNORMAL LOW (ref 3.87–5.11)
RDW: 12.9 % (ref 11.5–15.5)
WBC: 6.3 10*3/uL (ref 4.0–10.5)

## 2016-03-22 LAB — TROPONIN I: Troponin I: <0.03 ng/mL (ref <0.03)

## 2016-03-22 NOTE — ED Provider Notes (Signed)
[facility] DEPT Provider Note   CSN: [id number] Arrival date & time: 03/22/16  4098  First Provider Contact:  First MD Initiated Contact with Patient 03/22/16 1929        History   Chief Complaint Chief Complaint  Patient presents with  . Chest Pain    HPI Mary Cook is a 82 y.o. female.  HPI  82 year old female chest pain. Left side of the lower left breast. Symptom onset around 5 PM today. She cannot remember what she is doing at symptom onset. Mild dizziness which has since resolved. No nausea or vomiting. No dyspnea. No fevers or chills. No appreciable exacerbating relieving factors. No unusual leg pain or swelling.  Past Medical History:  Diagnosis Date  . CAD (coronary artery disease)    a. 04/2010 MI DES x 2 to LCX;  b. 05/2014 Cath: EF 35%, patent LCX stents w/ 70% stenosis in small AV groove LCX, Diag 50-60, RCA 50-60p->Med Rx.  . Cerebral aneurysm   . Chronic combined systolic (congestive) and diastolic (congestive) heart failure ([place])    a. 01/2015 Echo: EF 30-35%, sev inflat HK, Gr 1DD, mild AI.  Marland Kitchen Diverticulosis of colon   . Essential hypertension   . Frequent urination   . GIB (gastrointestinal bleeding)    a. 2002 Diverticular bleed.  . Hyperlipidemia   . Ischemic cardiomyopathy    a. 01/2015 Echo: EF 30-35%.  . Osteoarthritis   . Osteopenia   . Skin disorder   . Sleep apnea    cpap therapy  . Stroke ([place]) 2002  . T12 compression fracture ([place])    a. 04/2015.  Marland Kitchen Thrombus   . Type II diabetes mellitus ([place])   . Visual disorder     Patient Active Problem List   Diagnosis Date Noted  . Dyspnea 11/15/2015  . Acute respiratory failure with hypoxia ([place]) 11/15/2015  . CHF exacerbation ([place]) 11/15/2015  . Pain in the chest   . Renal insufficiency 06/02/2015  . Chronic systolic CHF (congestive heart failure) ([place])   . Ischemic cardiomyopathy   . T12 compression fracture ([place])   . Intertrigo 02/28/2015  . Actinic keratoses 02/28/2015  .  Noncompliance with diet and medication regimen 02/28/2015  . LBP (low back pain) 02/13/2015  . Acute on chronic renal insufficiency ([place]) 02/04/2015  . Cardiomyopathy, ischemic-EF 30-35% by echo 01/31/15 02/04/2015  . Chronic kidney disease stage III 02/03/2015  . Acute on chronic combined systolic and diastolic CHF, NYHA class 3 ([place]) 01/30/2015  . Cystitis 10/28/2014  . Insomnia 10/28/2014  . Alzheimer's disease 10/28/2014  . Falls frequently 10/12/2014  . Leg weakness, bilateral 10/12/2014  . OSA on CPAP 09/05/2014  . Rapid palpitations 06/07/2014  . Acute chest pain 06/06/2014  . Chest pain 06/06/2014  . Thoracic back pain after fall  04/13/2013  . Stress at home 01/26/2013  . GERD (gastroesophageal reflux disease) 01/25/2013  . Abnormal EKG, marked new ant TWI this adm with negative Troponins,08/18/11 08/20/2011  . CAD S/P CFX PCI Sept 2011 08/18/2011  . Sleep apnea, cpap had been stopped secondary to "sinus issues" 08/18/2011  . [name] deficiency 03/20/2010  . Diabetes mellitus ([place]) 01/07/2008  . Hyperlipidemia 01/07/2008  . Essential hypertension 04/25/2007  . DIVERTICULOSIS, COLON 04/25/2007  . OSTEOARTHRITIS 04/25/2007  . Disorder of bone and cartilage 04/25/2007  . DIVERTICULITIS, HX OF 04/25/2007    Past Surgical History:  Procedure Laterality Date  . ABDOMINAL HYSTERECTOMY  1980   no bso  . APPENDECTOMY    .  CARDIAC CATHETERIZATION  07/2011   LAD OK, D1 80%, CFX 30% w/ patent stent, RCA 50-60%, EF 40%  . CARDIAC CATHETERIZATION    . CORONARY ANGIOPLASTY WITH STENT PLACEMENT  05/11/2010   stent CFX  . DOPPLER ECHOCARDIOGRAPHY    . LEFT HEART CATHETERIZATION WITH CORONARY ANGIOGRAM N/A 08/18/2011   Procedure: LEFT HEART CATHETERIZATION WITH CORONARY ANGIOGRAM;  Surgeon: Muhammad A Arida, MD;  Location: [facility] CATH LAB;  Service: Cardiovascular;  Laterality: N/A;  . LEFT HEART CATHETERIZATION WITH CORONARY ANGIOGRAM N/A 06/08/2014   Procedure: LEFT HEART CATHETERIZATION  WITH CORONARY ANGIOGRAM;  Surgeon: Thomas A Kelly, MD;  Location: [facility] CATH LAB;  Service: Cardiovascular;  Laterality: N/A;  . myocardial perfusion study    . THROMBECTOMY    . TONSILLECTOMY      OB History    No data available       Home Medications    Prior to Admission medications   Medication Sig Start Date End Date Taking? Authorizing Provider  ACCU-CHEK SOFTCLIX LANCETS lancets 1 each by Other route 2 (two) times daily. Dx: E11.9, I 10 12/14/15   Aleksei V Plotnikov, MD  aspirin 81 MG EC tablet Take 81 mg by mouth daily.      Historical Provider, MD  Blood Glucose Monitoring Suppl (ACCU-CHEK AVIVA PLUS) w/Device KIT 1 each by Does not apply route 2 (two) times daily. Dx: E11.9, I 10 12/14/15   Aleksei V Plotnikov, MD  carvedilol (COREG) 12.5 MG tablet Take 0.5 tablets (6.25 mg total) by mouth 2 (two) times daily with a meal. 01/11/16   Thomas A Kelly, MD  citalopram (CELEXA) 20 MG tablet Take 1 tablet (20 mg total) by mouth daily. 12/06/15   Aleksei V Plotnikov, MD  digoxin (LANOXIN) 0.125 MG tablet Take 0.5 tablets (0.0625 mg total) by mouth daily. 03/05/16   Thomas A Kelly, MD  donepezil (ARICEPT) 5 MG tablet TAKE 1 TABLET AT BEDTIME 03/06/16   Aleksei V Plotnikov, MD  furosemide (LASIX) 40 MG tablet TAKE 1 TABLET TWICE A DAY 02/16/16   Aleksei V Plotnikov, MD  glucose blood (ACCU-CHEK AVIVA PLUS) test strip Use to check blood sugars twice a day Dx E11.9 03/20/16   Aleksei V Plotnikov, MD  isosorbide mononitrate (IMDUR) 60 MG 24 hr tablet Take 1 tablet (60 mg total) by mouth daily. 03/05/16   Thomas A Kelly, MD  KLOR-CON M20 20 MEQ tablet TAKE 1 TABLET EVERY DAY 03/06/16   Aleksei V Plotnikov, MD  metFORMIN (GLUCOPHAGE) 500 MG tablet TAKE 1 TABLET TWICE A DAY 03/06/16   Aleksei V Plotnikov, MD  omeprazole (PRILOSEC) 20 MG capsule Take 20 mg by mouth daily.    Historical Provider, MD  sacubitril-valsartan (ENTRESTO) 49-51 MG Take 1 tablet by mouth 2 (two) times daily. 12/06/15   Aleksei V  Plotnikov, MD  UNABLE TO FIND Outpatient PHYSICAL THERAPY  Diagnosis: TIA (transient ischemic attack) 10/31/15   Ripudeep K Rai, MD    Family History Family History  Problem Relation Age of Onset  . Hypertension Other   . CAD      father    Social History Social History  Substance Use Topics  . Smoking status: Never Smoker  . Smokeless tobacco: Never Used  . Alcohol use No     Allergies   Clopidogrel bisulfate; Lipitor [atorvastatin]; and Lisinopril   Review of Systems Review of Systems All systems reviewed and negative, other than as noted in HPI.  Physical Exam Updated Vital Signs BP (!) 140/53 (  BP Location: Left Arm)   Pulse (!) 59   Temp 97.7 F (36.5 C) (Oral)   Resp 16   Ht 5' 5" (1.651 m)   Wt 168 lb (76.2 kg)   SpO2 99%   BMI 27.96 kg/m   Physical Exam  Constitutional: She appears well-developed and well-nourished. No distress.  HENT:  Head: Normocephalic and atraumatic.  Eyes: Conjunctivae are normal.  Neck: Neck supple.  Cardiovascular: Normal rate and regular rhythm.   No murmur heard. Pulmonary/Chest: Effort normal and breath sounds normal. No respiratory distress.  Abdominal: Soft. There is no tenderness.  Musculoskeletal: She exhibits no edema.  Lower extremities symmetric as compared to each other. No calf tenderness. Negative Homan's. No palpable cords.     Neurological: She is alert.  Skin: Skin is warm and dry.  Psychiatric: She has a normal mood and affect.  Nursing note and vitals reviewed.    ED Treatments / Results  Labs (all labs ordered are listed, but only abnormal results are displayed) Labs Reviewed  BASIC METABOLIC PANEL - Abnormal; Notable for the following:       Result Value   Glucose, Bld 175 (*)    BUN 31 (*)    Creatinine, Ser 2.31 (*)    GFR calc non Af Amer 18 (*)    GFR calc Af Amer 21 (*)    Anion gap 4 (*)    All other components within normal limits  CBC - Abnormal; Notable for the following:    RBC  3.40 (*)    Hemoglobin 10.6 (*)    HCT 31.5 (*)    All other components within normal limits  TROPONIN I  I-STAT TROPOININ, ED  I-STAT TROPOININ, ED    EKG  EKG Interpretation  Date/Time:  Friday March 22 2016 17:34:16 EDT Ventricular Rate:  58 PR Interval:    QRS Duration: 132 QT Interval:  434 QTC Calculation: 426 R Axis:   -35 Text Interpretation:  Normal sinus rhythm Left axis deviation Left ventricular hypertrophy with QRS widening and repolarization abnormality Abnormal ECG Confirmed by   MD,  ([id number]) on 03/22/2016 7:31:29 PM       Radiology Dg Chest 2 View  Result Date: 03/22/2016 CLINICAL DATA:  Chest pain EXAM: CHEST  2 VIEW COMPARISON:  11/17/2015 FINDINGS: Mild hyperinflation. Heart and mediastinal contours are within normal limits. No focal opacities or effusions. No acute bony abnormality. Atherosclerotic calcifications in the aortic arch. IMPRESSION: Hyperinflation.  No active disease. Aortic atherosclerosis. Electronically Signed   By: Kevin  Dover M.D.   On: 03/22/2016 18:10   Procedures Procedures (including critical care time)  Medications Ordered in ED Medications - No data to display   Initial Impression / Assessment and Plan / ED Course  I have reviewed the triage vital signs and the nursing notes.  Pertinent labs & imaging results that were available during my care of the patient were reviewed by me and considered in my medical decision making (see chart for details).  Clinical Course     Final Clinical Impressions(s) / ED Diagnoses   Final diagnoses:  Chest pain, unspecified chest pain type    New Prescriptions New Prescriptions   No medications on file      , MD 04/01/16 1204  

## 2016-03-22 NOTE — ED Triage Notes (Signed)
Pt comes in with left chest pain noted below breast. Pt states this started 1700. She states she has had some shortness of breath and dizziness noted. Pt denies any n/v/d. Pt is alert and oriented upon triage.

## 2016-03-29 ENCOUNTER — Other Ambulatory Visit: Payer: Self-pay | Admitting: Pharmacist

## 2016-03-29 NOTE — Patient Outreach (Signed)
[facility] [facility]) Care Management  03/29/2016  Mary Cook Sep 26, 1925 [id number]  Received an inbound message from patient's spouse, on [facility] consent form, regarding a medication cost question.    Returned call and patient answered, she verified name/date of birth.  Patient reports her husband is out right now and not available and she doesn't know which medication he called about.  03/29/16 1453:  Made a second outreach attempt, and there was no answer.  Left a HIPAA compliant voice mail.   Plan:  Home visit previously scheduled for next week, will plan to make outreach at previously scheduled home visit.   Karrie Meres, PharmD, [place] [phone]

## 2016-04-03 ENCOUNTER — Other Ambulatory Visit: Payer: Self-pay | Admitting: Pharmacist

## 2016-04-03 NOTE — Patient Outreach (Signed)
[place] [facility]) [place]   04/03/2016  [facility] May 18, 1926 [id number]  Subjective:  Follow-up home visit with patient and spouse today.  Patient was referred to [facility] for medication adherence.   Patient verified HIPAA details.  Patient and spouse report they are not sure which medication was the expensive one that they called about to [place] last week.  Spouse reports he believes friend, [name], who is on [facility] Consent form may have called.    Spouse reports that he is filling patient pill planner and that he purchased a new weekly pill planner that he prefers vs the previous weekly pill planner that he was filling.    Patient and spouse report that patient is not missing medication doses.     Objective:   Encounter Medications: Outpatient Encounter Prescriptions as of 04/03/2016  Medication Sig Note  . aspirin 81 MG EC tablet Take 81 mg by mouth daily.     . carvedilol (COREG) 12.5 MG tablet Take 0.5 tablets (6.25 mg total) by mouth 2 (two) times daily with a meal.   . citalopram (CELEXA) 20 MG tablet Take 1 tablet (20 mg total) by mouth daily.   . digoxin (LANOXIN) 0.125 MG tablet Take 0.5 tablets (0.0625 mg total) by mouth daily.   Marland Kitchen donepezil (ARICEPT) 5 MG tablet TAKE 1 TABLET AT BEDTIME   . furosemide (LASIX) 40 MG tablet TAKE 1 TABLET TWICE A DAY 03/07/2016: Patient takes 40 mg daily except on Monday, Wednesday, Friday takes 40 mg twice daily per cardiology 01/13/16 office note.    . isosorbide mononitrate (IMDUR) 60 MG 24 hr tablet Take 1 tablet (60 mg total) by mouth daily.   Marland Kitchen KLOR-CON M20 20 MEQ tablet TAKE 1 TABLET EVERY DAY   . metFORMIN (GLUCOPHAGE) 500 MG tablet TAKE 1 TABLET TWICE A DAY   . omeprazole (PRILOSEC) 20 MG capsule Take 20 mg by mouth daily.   . sacubitril-valsartan (ENTRESTO) 49-51 MG Take 1 tablet by mouth 2 (two) times daily.    No facility-administered encounter medications on file as  of 04/03/2016.     Functional Status: In your present state of health, do you have any difficulty performing the following activities: 01/29/2016 01/29/2016  Hearing? [name]  Vision? N N  Difficulty concentrating or making decisions? [name]  Walking or climbing stairs? Y Y  Dressing or bathing? N N  Doing errands, shopping? [name]  Preparing Food and eating ? N N  Using the Toilet? N N  In the past six months, have you accidently leaked urine? N N  Do you have problems with loss of bowel control? N N  Managing your Medications? Y Y  Managing your Finances? [name]  Housekeeping or managing your Housekeeping? [name]  Some recent data might be hidden    Fall/Depression Screening: PHQ 2/9 Scores 03/14/2016 02/12/2016 01/29/2016 12/28/2015 12/06/2015 10/28/2014  PHQ - 2 Score 2 2 2 1 4  0  PHQ- 9 Score 5 5 5  - 18 -    Assessment:  Medication management:  Review of pill planner shows that furosemide dose was missing from Thursday, Friday and Saturday am slots.  It also showed that there was an extra dose of isosorbide mononitrate in the slot for Thursday and Saturday.  Assisted patient and spouse in correcting the pill box.   Patient and spouse report they would prefer [name] be called in about 2 weeks to see  which medication was the expensive one.  Apparently she is out of town and they would like to not bother her while her while she is out of town.   Discussed the Medicare Part D coverage gap with patient and spouse during this visit.    Plan:  Follow-up home visit for medication pill planner review scheduled.    Will attempt to call [name], on [facility] Consent in about 2 weeks as well per patient and spouse request to determine which medication may have been the expensive one.    Karrie Meres, PharmD, [place] [phone]

## 2016-04-04 ENCOUNTER — Other Ambulatory Visit: Payer: Self-pay | Admitting: *Deleted

## 2016-04-04 ENCOUNTER — Encounter: Payer: Self-pay | Admitting: *Deleted

## 2016-04-04 NOTE — Patient Outreach (Signed)
[place] [facility]) Care Management  04/04/2016  [place] 1926-01-04 [id number]  Telephone call to patient who gave HIPPA verification.   Patient voices that she is doing better with taking medications more consistently. States alarm reminder for taking medication does help her remember when to take medicine.   States friend and spouse continue to fix her medication box each week.  States she has had no falls & husband still provides transportation for her to MD appointments. States she has no other health concerns at this time. States she does get visits from pharmacy & social worker.   Assessment: No further RN case management needs. Pharmacist continues to work with patient & spouse on medication adherence. Social worker involved with patient's psychosocial concerns.   Patient agrees with RN CM discipline closure; no further RN telephonic services needed.   Plan; RN CM communicated with Pharmacist & Social worker with assessment of no further RN CM needs and discipline closure. Team is in agreement. .   RN CM telephonic discipline closure. Will send discipline closure letter to MD.       Sherrin Daisy, [place] Management Coordinator [facility] Care Management  [phone]

## 2016-04-05 ENCOUNTER — Other Ambulatory Visit: Payer: Self-pay | Admitting: Internal Medicine

## 2016-04-15 ENCOUNTER — Other Ambulatory Visit: Payer: Self-pay | Admitting: Pharmacist

## 2016-04-15 ENCOUNTER — Other Ambulatory Visit: Payer: Self-pay | Admitting: Licensed Clinical Social Worker

## 2016-04-15 NOTE — Patient Outreach (Signed)
[place] [facility]) Care Management  04/15/2016  [place] 01/12/1926 [id number]   Unsuccessful attempt to reach patient's friend on [facility] Consent form, [name].    At last home visit patient and spouse had requested [facility] Pharmacist contact [name] to determine which medication may have increased in price.   No answer, HIPAA compliant message left requesting call back.   Plan:  Will attempt to reach [name] again at a later date, next home visit with patient was previously scheduled.   Karrie Meres, PharmD, [place] [phone]

## 2016-04-15 NOTE — Patient Outreach (Signed)
Assessment:  Client receives support from her spouse.  Client also receives support from her friend, [name].  Spouse of client transports client to and from client's scheduled medical appointments. Client sees Dr. Evie Lacks. Plotnikov as her primary care doctor. Client is also receiving [facility] pharmacy support with pharmacist Lennette Bihari Reudinger.  Lennette Bihari has been speaking with client, spouse, and [name] about medication management for client.  Client is trying to attend all scheduled medical appointments.  CSW spoke via phone with [name], friend of client, on 04/15/16. Client has given permission for [facility] staff to speak with [name] about client status and needs.  [name] reported that client is eating adequately and sleeping adequately. [name] said she also had been assisting client in taking client to some of client's scheduled medical appointments.  [name] said that client had her prescribed medications and was trying to take her medications as prescribed. Client does have diagnosis of Alzheimer's Disease.  CSW encouraged that client attend all scheduled client medical appointments in the next 30 days.  CSW gave [name] [facility] CSW number of [phone] and encouraged [name] to call CSW as needed to discuss social work needs of client.  CSW also encouraged [name] to communicate as needed with [facility] pharmacist, Isabell Jarvis, to discuss medication needs of client. CSW thanked [name] for phone call with CSW on 04/15/16.   Plan:  Client to attend all scheduled client medical appointments in the next 30 days.  CSW to call spouse of client or [name] in 4 weeks to assess client needs.   Norva Riffle.Shanecia Hoganson MSW, LCSW Licensed Clinical Social Worker [facility] Care Management [phone]

## 2016-04-17 ENCOUNTER — Other Ambulatory Visit: Payer: Self-pay | Admitting: Pharmacist

## 2016-04-17 NOTE — Patient Outreach (Signed)
[place] [facility]) [place]   04/17/2016  Airiel Antoine [facility] 12-Jun-1926 [id number]  Subjective:  Follow-up home visit with patient and spouse, per patient request spouse present, to follow-up on medication pill planners.    Informed patient and spouse that I attempted to reach their friend, [name], per their request in regards to the expensive medication, but had to leave a message that was not returned as of time of visit.   Patient verified HIPAA details.   Spouse states he is still filling patient's pill planner.  He has one 7-day pill planner with slots for am, noon, eve, and bed on it.  Spouse reports that he uses the single 7 day pill planner for two weeks and only puts the am medications in it---he states he puts am medications in the am and eve slot on it for the 2 week period.  He reports that he now takes the 4 medications she uses in the evening from the pill bottles instead of placing them in a pill planner.   Patient had some refills from [facility] and in reviewing the paperwork that came with the refills, it appears the expensive medication was Delene Loll, with co-insurance to patient of $319.95/90 days---she may be in the coverage gap on her Part D benefit.   Spouse also reports "We are happy here and don't plan to leave."  Objective:   Encounter Medications: Outpatient Encounter Prescriptions as of 04/17/2016  Medication Sig  . aspirin 81 MG EC tablet Take 81 mg by mouth daily.    . carvedilol (COREG) 12.5 MG tablet Take 0.5 tablets (6.25 mg total) by mouth 2 (two) times daily with a meal.  . citalopram (CELEXA) 20 MG tablet Take 1 tablet (20 mg total) by mouth daily.  . digoxin (LANOXIN) 0.125 MG tablet Take 0.5 tablets (0.0625 mg total) by mouth daily.  Marland Kitchen donepezil (ARICEPT) 5 MG tablet TAKE 1 TABLET AT BEDTIME  . furosemide (LASIX) 40 MG tablet Take 1 tablet (40 mg total) by mouth 2 (two) times daily.  . isosorbide mononitrate  (IMDUR) 60 MG 24 hr tablet Take 1 tablet (60 mg total) by mouth daily.  Marland Kitchen KLOR-CON M20 20 MEQ tablet TAKE 1 TABLET EVERY DAY  . metFORMIN (GLUCOPHAGE) 500 MG tablet TAKE 1 TABLET TWICE A DAY  . omeprazole (PRILOSEC) 20 MG capsule Take 20 mg by mouth daily.  . sacubitril-valsartan (ENTRESTO) 49-51 MG Take 1 tablet by mouth 2 (two) times daily.   No facility-administered encounter medications on file as of 04/17/2016.     Functional Status: In your present state of health, do you have any difficulty performing the following activities: 01/29/2016 01/29/2016  Hearing? [name]  Vision? N N  Difficulty concentrating or making decisions? [name]  Walking or climbing stairs? Y Y  Dressing or bathing? N N  Doing errands, shopping? [name]  Preparing Food and eating ? N N  Using the Toilet? N N  In the past six months, have you accidently leaked urine? N N  Do you have problems with loss of bowel control? N N  Managing your Medications? Y Y  Managing your Finances? [name]  Housekeeping or managing your Housekeeping? [name]  Some recent data might be hidden    Fall/Depression Screening: PHQ 2/9 Scores 03/14/2016 02/12/2016 01/29/2016 12/28/2015 12/06/2015 10/28/2014  PHQ - 2 Score 2 2 2 1 4  0  PHQ- 9 Score 5 5 5  - 18 -  Assessment:  Medication management:  Reviewed pill planner---there were multiple discrepancies present 1) There was an extra donepezil dose for 5 days. 2) There was an extra Entresto dose for 1 day 3) There was an extra carvedilol dose for 1 day 4) There was no morning furosemide dose for the 2 week period  Adjusted pill planner as necessary to fix the above mentioned discrepancies.    Counseled on potential confusion of using the pill box in the manner they currently are vs placing all medications in it.   Spouse reports he will continue filling pill planner as for some reason he no longer wishes to have their friend [name], fill the pill planner.    Have discussed other options for medication  management such as blister packaging from a pharmacy, but they prefer to obtain medications from mail order due to cost savings and mail order doesn't do blister packing.    Medication cost:  Entresto--- Cost may have gone up due to patient being in the coverage gap.  Had previously discussed with patient and spouse they could apply to [employer] for co-pay assistance---they did not want to at the time.   Patient now has enough Entresto to last until the middle of December, at which time patient could obtain a one month supply, see if her prescriber has samples, or purchase enough to last until the end of the year as her Part D plan will "re-set" 08/26/2016.    Plan:  Attempted to place another call to patient's friend [name], per patient and spouse request, on 04/17/16 at 1340.  There was no answer and a HIPAA compliant message was left.    Return home visit scheduled for 2 weeks.   Will route this note to patient's PCP given continued concerns with medication pill planner.   Will continue to try to make contact with their friend, [name].   Karrie Meres, PharmD, [place] [phone]

## 2016-04-19 ENCOUNTER — Other Ambulatory Visit: Payer: Self-pay | Admitting: Pharmacist

## 2016-04-19 NOTE — Patient Outreach (Signed)
[facility]) Care Management  04/19/2016  Sarabella Nakai [facility] 1925-09-21 [id number]  Incoming call from [name], patient's friend on [facility] Consent form.  She verified patient's HIPAA details.   [name] states that the medication that was expensive for patient was [employer].  Discussed with [name] that since patient obtained a 90 day supply and should have enough medication to last until mid-December, she could either fill a partial supply in December, try to get samples from prescriber, or apply to the [employer] to see if she is able to get patient assistance.   Discussed that [employer] was previously discussed with patient and her spouse, and they were resistant at the time.  [name] was provided with the [employer] and states she will discuss it with patient further.    She states that she occasionally goes by patient's home to check on her and her pill boxes.  Discussed that pill planner was adjusted this week for medication errors in it as previously documented in home visit note.  Discussed concerns of pill planner management and [name] states spouse likes to try to do it on his own and resists her assistance with it.    Plan:  Will follow up with patient as previously scheduled.    Karrie Meres, PharmD, [place] [phone]

## 2016-04-30 ENCOUNTER — Other Ambulatory Visit: Payer: Self-pay | Admitting: Pharmacist

## 2016-04-30 NOTE — Patient Outreach (Signed)
[facility]) Care Management  [name]   04/30/2016  Edid Fuster [facility] 1925-11-06 [id number]  Subjective:  Follow-up home visit on 04/30/16 with patient and her spouse, whom she requests remain present during visit, regarding her medication pill planner.   Patient reports she has been doing okay and spouse reports he has been filling the weekly pill planner.  He states that he is back to using the pill planner for one week at a time filling the am, noon, and evening slots in the pill planner.    Patient and spouse report that patient has been unsteady on her feet and comes close to falling---patient reports she has not discussed this with her PCP.    Patient and spouse state that they wish to continue to have spouse fill her pill planner versus having their family friend [name], whom patient has on her [facility] Consent form, fill patient's pill planner.    Objective:   Encounter Medications: Outpatient Encounter Prescriptions as of 04/30/2016  Medication Sig Note  . aspirin 81 MG EC tablet Take 81 mg by mouth daily.     . carvedilol (COREG) 12.5 MG tablet Take 0.5 tablets (6.25 mg total) by mouth 2 (two) times daily with a meal.   . citalopram (CELEXA) 20 MG tablet Take 1 tablet (20 mg total) by mouth daily.   . digoxin (LANOXIN) 0.125 MG tablet Take 0.5 tablets (0.0625 mg total) by mouth daily.   Marland Kitchen donepezil (ARICEPT) 5 MG tablet TAKE 1 TABLET AT BEDTIME   . furosemide (LASIX) 40 MG tablet Take 1 tablet (40 mg total) by mouth 2 (two) times daily. 04/18/2016: Spouse reports pt taking 1 tab daily, except 1 tab twice daily on mon, wed, fri per cardiology.    . isosorbide mononitrate (IMDUR) 60 MG 24 hr tablet Take 1 tablet (60 mg total) by mouth daily.   Marland Kitchen KLOR-CON M20 20 MEQ tablet TAKE 1 TABLET EVERY DAY   . metFORMIN (GLUCOPHAGE) 500 MG tablet TAKE 1 TABLET TWICE A DAY   . omeprazole (PRILOSEC) 20 MG capsule Take 20 mg by mouth daily.   . sacubitril-valsartan (ENTRESTO)  49-51 MG Take 1 tablet by mouth 2 (two) times daily.    No facility-administered encounter medications on file as of 04/30/2016.     Functional Status: In your present state of health, do you have any difficulty performing the following activities: 01/29/2016 01/29/2016  Hearing? [name]  Vision? N N  Difficulty concentrating or making decisions? [name]  Walking or climbing stairs? Y Y  Dressing or bathing? N N  Doing errands, shopping? [name]  Preparing Food and eating ? N N  Using the Toilet? N N  In the past six months, have you accidently leaked urine? N N  Do you have problems with loss of bowel control? N N  Managing your Medications? Y Y  Managing your Finances? [name]  Housekeeping or managing your Housekeeping? [name]  Some recent data might be hidden    Fall/Depression Screening: PHQ 2/9 Scores 03/14/2016 02/12/2016 01/29/2016 12/28/2015 12/06/2015 10/28/2014  PHQ - 2 Score 2 2 2 1 4  0  PHQ- 9 Score 5 5 5  - 18 -    Assessment:  Medication management:  The pill planner has medications empty for Saturday-Tuesday morning with the exception of the second furosemide dose on Monday being missed per patient and spouse report.    Upon review of pill planner there is no morning dose of furosemide  in it, rather there is an extra dose of donepezil in the morning slot.    Counseled patient and spouse on importance of taking furosemide as her prescriber directs.    Discussed with patient and spouse that they may wish to consider alternatives to pill planner filling, such as having someone help them or finding a pharmacy who does monthly pill packaging, if they continue to have mistakes, as it is important for patient to get her medications as prescribed by her medical prescribers.    Adjusted pill planner to fix the discrepancies notes above.    Spouse and patient report at this time they do not wish to apply to [employer] for help with cost of Entresto.    Plan:  Follow-up home visit scheduled for  2 weeks.   Will route note to patient's PCP to update him on patient report of increased unsteadiness and close encounters with falls.    Karrie Meres, PharmD, [name] [phone]

## 2016-05-01 ENCOUNTER — Ambulatory Visit (INDEPENDENT_AMBULATORY_CARE_PROVIDER_SITE_OTHER): Payer: Commercial Managed Care - HMO | Admitting: Cardiovascular Disease

## 2016-05-01 ENCOUNTER — Encounter: Payer: Self-pay | Admitting: Cardiovascular Disease

## 2016-05-01 VITALS — BP 182/74 | HR 56 | Ht 65.0 in | Wt 167.0 lb

## 2016-05-01 DIAGNOSIS — Z79899 Other long term (current) drug therapy: Secondary | ICD-10-CM | POA: Diagnosis not present

## 2016-05-01 DIAGNOSIS — I5043 Acute on chronic combined systolic (congestive) and diastolic (congestive) heart failure: Secondary | ICD-10-CM

## 2016-05-01 DIAGNOSIS — N183 Chronic kidney disease, stage 3 unspecified: Secondary | ICD-10-CM

## 2016-05-01 DIAGNOSIS — I255 Ischemic cardiomyopathy: Secondary | ICD-10-CM | POA: Diagnosis not present

## 2016-05-01 DIAGNOSIS — I509 Heart failure, unspecified: Secondary | ICD-10-CM

## 2016-05-01 DIAGNOSIS — N184 Chronic kidney disease, stage 4 (severe): Secondary | ICD-10-CM

## 2016-05-01 DIAGNOSIS — I10 Essential (primary) hypertension: Secondary | ICD-10-CM

## 2016-05-01 DIAGNOSIS — J9601 Acute respiratory failure with hypoxia: Secondary | ICD-10-CM | POA: Diagnosis not present

## 2016-05-01 MED ORDER — AMLODIPINE BESYLATE 5 MG PO TABS: 5.0000 mg | ORAL_TABLET | Freq: Every day | ORAL | 3 refills | Status: DC

## 2016-05-01 MED ORDER — FUROSEMIDE 40 MG PO TABS: 20.0000 mg | ORAL_TABLET | Freq: Every day | ORAL | 3 refills | Status: DC

## 2016-05-01 MED ORDER — AMLODIPINE BESYLATE 5 MG PO TABS: 5.0000 mg | ORAL_TABLET | Freq: Every day | ORAL | 1 refills | Status: DC

## 2016-05-01 NOTE — Patient Instructions (Addendum)
Your physician recommends that you return for lab work TODAY and again end of next week.    Your physician has recommended you make the following change in your medication:   1.) the furosemide has been decreased to 20 mg.  2.) start new prescription for amlodipine 5 mg. This has [facility].  Your physician recommends that you schedule a follow-up appointment in: 6 weeks.

## 2016-05-03 NOTE — Progress Notes (Signed)
Patient ID: Mary Cook, female   DOB: [birth year], 82 y.o.   MRN: [id number]     HPI: Mary Cook is a 82 y.o. female who presents to the office today for a 4 month follow up cardiology evaluation.  She was recently hospitalized with CHF.  Mary Cook has known CAD and in September 2011 suffered a large out of hospital anterior wall myocardial infarction, complicated by CHF and post MI pericarditis.  At that time, she had significant thrombus burden requiring thrombectomy.  She underwent stenting of her left circumflex coronary artery with tandem 3.015 mm Promus stents postdilated 3.5 mm meters.  She had concomitant CAD involving her diagonal branch of her LAD as well as RCA.  Last year she was readmitted to the hospital with increasing shortness of breath.  A nuclear study which demonstrated inferior apical ischemia. On 06/08/2014,  repeat cardiac catheterization by me showed moderate LV dysfunction with global hypokinesis and slightly more pronounced inferior hypocontractility.  Ejection fraction was 35%.  There was moderate CAD with 50-60% smooth eccentric stenosis in the first diagonal branch of the LAD, widely patent proximal tandem stents in the circumflex vessel with a focal 70% stenosis of a small AV groove circumflex, and 50-60% stenosis in the proximal RCA with mild mid systolic bridging in a dominant RCA system.  Increased medical therapy was recommended.  She also has a history of obstructive sleep apnea and had been using CPAP therapy.  Presently, she admits to 100% compliance.  She is unaware of breakthrough snoring.  She is diabetic on metformin 500 mg twice a day.  She also has a history of hyperlipidemia for which she has been taking atorvastatin 40 mg.  There is a history of hypertension for which she has been on furosemide 40 mg in addition to her isosorbide.  She  sustained a fall and was evaluated in the emergency room by Dr. Fuller Song in October 2015.  CT of her head  was negative, although she had chronic white matter ischemic change and diffuse severe degenerative changes.  She was also noted to have carotid atherosclerotic vascular disease.  When I saw her in October 2016 since she had had several recent admissions with  acute on chronic combined systolic and diastolic heart failure and had an ejection fraction of 30-35% , I recommended initiation of Entresto at 24/26 and set up a follow up appointment to see Mary Cook, PharmD several weeks later for dose titration.  She stated that she had felt better when she had taken the medicine, but never followed up with the subsequent evaluation and consequently stopped taking the medicine.  Once the samples ran out.  When I saw her last in January 2017, I initiated entresto and provided her with 2 weeks of 24/26 and then titrated her to 49/51.  She states that she has felt fairly well with this medical regimen.  She denies recent significant change in symptomatology with reference to shortness of breath.  Lab work weeks ago revealed a creatinine of 1.17.  She had normal LFTs.  Her potassium was 3.5.  She had undergone a nuclear perfusion study on 10/29/2015 which showed findings consistent with prior MI with very mild peri-infarction ischemia involving the inferolateral wall.  She denies chest pressure.  She is unaware of palpitations.  She denies PND, orthopnea.    Since I saw her, she was hospitalized in March , she had not taken her medicine and had acute on chronic combined systolic and diastolic heart  failure, which subsided.  She now has been back on her medication.  She saw Mary Cook in follow-up.  I last saw her in May.  She had felt improved on contrast oh.  Apparently, since that time she was seen in the emergency room on 03/22/2016.  With left-sided chest pain.  There was no significant dysmetria.  Homans were negative.  Note laboratory.  During that encounter revealed a creatinine of 2.31.  She apparently was never  told to adjust her medications.  Upon reviewing her medication list today, she has still been taking and dressed oh 72/51.  In addition to furosemide 40 mg twice a day, digoxin 0.0625 mg daily and carvedilol 6.25 mg twice a day.  She presents for reevaluation.  Past Medical History:  Diagnosis Date  . CAD (coronary artery disease)    a. 04/2010 MI DES x 2 to LCX;  b. 05/2014 Cath: EF 35%, patent LCX stents w/ 70% stenosis in small AV groove LCX, Diag 50-60, RCA 50-60p->Med Rx.  . Cerebral aneurysm   . Chronic combined systolic (congestive) and diastolic (congestive) heart failure ([place])    a. 01/2015 Echo: EF 30-35%, sev inflat HK, Gr 1DD, mild AI.  Marland Kitchen Diverticulosis of colon   . Essential hypertension   . Frequent urination   . GIB (gastrointestinal bleeding)    a. 2002 Diverticular bleed.  . Hyperlipidemia   . Ischemic cardiomyopathy    a. 01/2015 Echo: EF 30-35%.  . Osteoarthritis   . Osteopenia   . Skin disorder   . Sleep apnea    cpap therapy  . Stroke ([place]) 2002  . T12 compression fracture ([place])    a. 04/2015.  Marland Kitchen Thrombus   . Type II diabetes mellitus ([place])   . Visual disorder     Past Surgical History:  Procedure Laterality Date  . ABDOMINAL HYSTERECTOMY  1980   no bso  . APPENDECTOMY    . CARDIAC CATHETERIZATION  07/2011   LAD OK, D1 80%, CFX 30% w/ patent stent, RCA 50-60%, EF 40%  . CARDIAC CATHETERIZATION    . CORONARY ANGIOPLASTY WITH STENT PLACEMENT  05/11/2010   stent CFX  . DOPPLER ECHOCARDIOGRAPHY    . LEFT HEART CATHETERIZATION WITH CORONARY ANGIOGRAM N/A 08/18/2011   Procedure: LEFT HEART CATHETERIZATION WITH CORONARY ANGIOGRAM;  Surgeon: Wellington Hampshire, MD;  Location: [facility] CATH LAB;  Service: Cardiovascular;  Laterality: N/A;  . LEFT HEART CATHETERIZATION WITH CORONARY ANGIOGRAM N/A 06/08/2014   Procedure: LEFT HEART CATHETERIZATION WITH CORONARY ANGIOGRAM;  Surgeon: Troy Sine, MD;  Location[facility];  Service: Cardiovascular;  Laterality: N/A;  .  myocardial perfusion study    . THROMBECTOMY    . TONSILLECTOMY      Allergies  Allergen Reactions  . Clopidogrel Bisulfate Nausea And Vomiting    Patient is not aware of this allergy  . Lipitor [Atorvastatin] Other (See Comments)    Weak legs  . Lisinopril Cough    Patient is not aware of this allergy    Current Outpatient Prescriptions  Medication Sig Dispense Refill  . aspirin 81 MG EC tablet Take 81 mg by mouth daily.      . carvedilol (COREG) 12.5 MG tablet Take 0.5 tablets (6.25 mg total) by mouth 2 (two) times daily with a meal. 90 tablet 3  . citalopram (CELEXA) 20 MG tablet Take 1 tablet (20 mg total) by mouth daily. 90 tablet 2  . digoxin (LANOXIN) 0.125 MG tablet Take 0.5 tablets (0.0625 mg  total) by mouth daily. 45 tablet 3  . donepezil (ARICEPT) 5 MG tablet TAKE 1 TABLET AT BEDTIME 90 tablet 2  . furosemide (LASIX) 40 MG tablet Take 0.5 tablets (20 mg total) by mouth daily. 90 tablet 3  . isosorbide mononitrate (IMDUR) 60 MG 24 hr tablet Take 1 tablet (60 mg total) by mouth daily. 90 tablet 3  . KLOR-CON M20 20 MEQ tablet TAKE 1 TABLET EVERY DAY 90 tablet 2  . metFORMIN (GLUCOPHAGE) 500 MG tablet TAKE 1 TABLET TWICE A DAY 180 tablet 2  . omeprazole (PRILOSEC) 20 MG capsule Take 20 mg by mouth daily.    . sacubitril-valsartan (ENTRESTO) 49-51 MG Take 1 tablet by mouth 2 (two) times daily. 180 tablet 3  . amLODipine (NORVASC) 5 MG tablet Take 1 tablet (5 mg total) by mouth daily. 30 tablet 1   No current facility-administered medications for this visit.     Social History   Social History  . Marital status: Married    Spouse name: N/A  . Number of children: N/A  . Years of education: N/A   Occupational History  . Retired    Social History Main Topics  . Smoking status: Never Smoker  . Smokeless tobacco: Never Used  . Alcohol use No  . Drug use: No  . Sexual activity: Not on file   Other Topics Concern  . Not on file   Social History Narrative  . No  narrative on file    Family History  Problem Relation Age of Onset  . Hypertension Other   . CAD      father    ROS General: Negative; No fevers, chills, or night sweats HEENT: Negative; No changes in vision or hearing, sinus congestion, difficulty swallowing Pulmonary: Negative; No cough, wheezing, shortness of breath, hemoptysis Cardiovascular: See HPI:  GI: positive for GERD; No nausea, vomiting, diarrhea, or abdominal pain GU: Negative; No dysuria, hematuria, or difficulty voiding Musculoskeletal: degenerative changes to her cervical spine.  Arthritis;  Hematologic: Negative; no easy bruising, bleeding Endocrine: positive for diabetes mellitus Neuro: Negative; no changes in balance, headaches Skin: Negative; No rashes or skin lesions Psychiatric: Negative; No behavioral problems, depression Sleep: Negative; No snoring,  daytime sleepiness, hypersomnolence, bruxism, restless legs, hypnogognic hallucinations. Other comprehensive 14 point system review is negative   Physical Exam BP (!) 182/74   Pulse (!) 56   Ht '5\' 5"'$  (1.651 m)   Wt 167 lb (75.8 kg)   BMI 27.79 kg/m   Repeat blood pressure by me was 138/88  Wt Readings from Last 3 Encounters:  05/01/16 167 lb (75.8 kg)  03/22/16 168 lb (76.2 kg)  03/15/16 168 lb (76.2 kg)   General: Alert, oriented, no distress.  Skin: normal turgor, no rashes, warm and dry HEENT: Normocephalic, atraumatic. Pupils equal round and reactive to light; sclera anicteric; extraocular muscles intact, No lid lag; Nose without nasal septal hypertrophy; Mouth/Parynx benign; Mallinpatti scale 3 Neck: No JVD, no carotid bruits; normal carotid upstroke Lungs: clear to ausculatation and percussion bilaterally; no wheezing or rales, normal inspiratory and expiratory effort Chest wall: without tenderness to palpitation Heart: PMI not displaced, RRR, s1 s2 normal, 1/6 systolic murmur, No diastolic murmur, no rubs, gallops, thrills, or heaves Abdomen:  soft, nontender; no hepatosplenomehaly, BS+; abdominal aorta nontender and not dilated by palpation. Back: no CVA tenderness Pulses: 2+  Musculoskeletal: full range of motion, normal strength, no joint deformities Extremities: Pulses 2+, no clubbing cyanosis or edema, Homan's sign negative  Neurologic: grossly nonfocal; Cranial nerves grossly wnl Psychologic: Normal mood and affect  ECG (independently read by me): Normal sinus rhythm at 76 bpm.  LVH by voltage criteria.  January 2017 ECG (independently read by me):  Normal sinus rhythm at 73 bpm.  LVH.  Nonspecific T changes.  ECG (independently read by me): Normal sinus rhythm at 68 bpm.  H by voltage.  T-wave abnormality in leads 1 and aVL.  January 2016 ECG (independently read by me): Normal sinus rhythm at 68 bpm.  LVH by voltage in aVL.  Nonspecific T changes.  November 2015 ECG (independently read by me): sinus bradycardia 59 bpm.  LVH with repolarization changes.  Nonspecific T changes.   LABS: BMP Latest Ref Rng & Units 03/22/2016 03/15/2016 01/01/2016  Glucose 65 - 99 mg/dL 175(H) 222(H) 173(H)  BUN 6 - 20 mg/dL 31(H) 33(H) 33(H)  Creatinine 0.44 - 1.00 mg/dL 2.31(H) 1.93(H) 1.60(H)  Sodium 135 - 145 mmol/L 136 138 137  Potassium 3.5 - 5.1 mmol/L 4.2 4.5 4.2  Chloride 101 - 111 mmol/L 104 100 101  CO2 22 - 32 mmol/L '28 31 23  '$ Calcium 8.9 - 10.3 mg/dL 8.9 9.3 9.2   Hepatic Function Latest Ref Rng & Units 03/15/2016 11/14/2015 10/30/2015  Total Protein 6.0 - 8.3 g/dL 7.0 7.1 6.4(L)  Albumin 3.5 - 5.2 g/dL 3.6 3.3(L) 3.1(L)  AST 0 - 37 U/L '8 27 16  '$ ALT 0 - 35 U/L 8 21 11(L)  Alk Phosphatase 39 - 117 U/L 80 82 71  Total Bilirubin 0.2 - 1.2 mg/dL 0.4 0.5 0.8  Bilirubin, Direct 0.0 - 0.3 mg/dL 0.0 - -   CBC Latest Ref Rng & Units 03/22/2016 11/16/2015 11/14/2015  WBC 4.0 - 10.5 K/uL 6.3 6.5 11.2(H)  Hemoglobin 12.0 - 15.0 g/dL 10.6(L) 11.3(L) 12.2  Hematocrit 36.0 - 46.0 % 31.5(L) 34.8(L) 37.1  Platelets 150 - 400 K/uL 196 167 186     Lab Results  Component Value Date   MCV 92.6 03/22/2016   MCV 89.9 11/16/2015   MCV 91.4 11/14/2015   Lab Results  Component Value Date   TSH 1.56 10/12/2014   Lab Results  Component Value Date   HGBA1C 7.3 (H) 03/15/2016   BNP (last 3 results)  Recent Labs  05/24/15 1018 11/14/15 2300 11/17/15 0350  BNP 424.0* 515.6* 196.9*    ProBNP (last 3 results)  Recent Labs  01/01/16 1623  PROBNP 1,963.00*    Lipid Panel     Component Value Date/Time   CHOL 230 (H) 11/16/2015 0240   TRIG 97 11/16/2015 0240   HDL 68 11/16/2015 0240   CHOLHDL 3.4 11/16/2015 0240   VLDL 19 11/16/2015 0240   LDLCALC 143 (H) 11/16/2015 0240   LDLDIRECT 180.7 12/09/2011 1109    RADIOLOGY: No results found.    ASSESSMENT AND PLAN: Mary Cook  is an 82 year-old female who suffered a left circumflex MI in 2011 and underwent tandem stenting of her proximal circumflex vessel.  Cardiac catheterization on 06/08/2014 showed moderate LV dysfunction with moderate multivessel CAD but widely patent tandem stents in the circumflex vessel.  An echo Doppler study in June 2016 showed an ejection fraction at 30-35% with severe LVH.  There was grade 1 diastolic dysfunction and she had abnormal tissue Doppler suggesting high ventricular filling pressure;  mild aortic insufficiency and mitral annular calcification.  She has had several readmissions with acute on chronic combined systolic and diastolic heart failure.  She had been started on entresto  in addition to her diuretic regimen and initially had felt improved.  Her, since I last saw her, subsequent blood work which was obtained which she was in the emergency room has shown worsening renal function with a creatinine that had increased to 2.31.  I do not know what her present lab values are.  Presently, I am recommending that she hold her Lasix today, which she had been taking 40 mg twice a day and will reduce this to 20 mg daily.  She is hypertensive  with a blood pressure 182/74 initially and on repeat by me 178/70.  I am adding amlodipine 5 mg to her regimen.  I am checking before meals met and proBNP day.  In one week.  She will have a follow-up be met on her reduced dose of Lasix.  If her renal function remains elevated, entresto will need to be discontinued.  I will see her in 4-6 weeks for reevaluation.  Time spent: 25 minutes  Troy Sine, MD, [facility]  05/03/2016 5:59 PM

## 2016-05-06 ENCOUNTER — Telehealth: Payer: Self-pay | Admitting: Cardiovascular Disease

## 2016-05-06 NOTE — Telephone Encounter (Signed)
Spoke to Mary Cook, advised I was unable to locate message or communication indicating nature of our outgoing call and that I would route to [place] to follow up.  Recent OV. Advised [name] on labwork pt needs to do mid-week this week. Will have [name] communicate further instructions. Aware she is out of office today.

## 2016-05-06 NOTE — Telephone Encounter (Signed)
New Message  Mary Cook per DPR signed voiced she is returning phone call from 9.8.17 and would like for someone to contact her back.  Please follow up with Mary Cook.

## 2016-05-08 DIAGNOSIS — J9601 Acute respiratory failure with hypoxia: Secondary | ICD-10-CM | POA: Diagnosis not present

## 2016-05-08 DIAGNOSIS — I10 Essential (primary) hypertension: Secondary | ICD-10-CM | POA: Diagnosis not present

## 2016-05-08 DIAGNOSIS — I509 Heart failure, unspecified: Secondary | ICD-10-CM | POA: Diagnosis not present

## 2016-05-08 DIAGNOSIS — I255 Ischemic cardiomyopathy: Secondary | ICD-10-CM | POA: Diagnosis not present

## 2016-05-09 LAB — COMPREHENSIVE METABOLIC PANEL
ALK PHOS: 78 U/L (ref 33–130)
ALT: 10 U/L (ref 6–29)
AST: 10 U/L (ref 10–35)
Albumin: 3.6 g/dL (ref 3.6–5.1)
BUN: 21 mg/dL (ref 7–25)
CHLORIDE: 102 mmol/L (ref 98–110)
CO2: 27 mmol/L (ref 20–31)
CREATININE: 1.52 mg/dL — AB (ref 0.60–0.88)
Calcium: 9 mg/dL (ref 8.6–10.4)
GLUCOSE: 158 mg/dL — AB (ref 65–99)
POTASSIUM: 4.4 mmol/L (ref 3.5–5.3)
SODIUM: 138 mmol/L (ref 135–146)
TOTAL PROTEIN: 6.6 g/dL (ref 6.1–8.1)
Total Bilirubin: 0.5 mg/dL (ref 0.2–1.2)

## 2016-05-10 LAB — PRO B NATRIURETIC PEPTIDE: PRO B NATRI PEPTIDE: 922 pg/mL — AB (ref <451)

## 2016-05-14 ENCOUNTER — Other Ambulatory Visit: Payer: Self-pay | Admitting: Pharmacist

## 2016-05-14 NOTE — Patient Outreach (Signed)
[name]) Care Management  [place]   05/14/2016  Mary Cook [facility] 05-28-1926 [id number]  Subjective:  Follow-up home visit on 05/14/16 with patient and spouse, whom patient requests be present during visit, regarding patient's pill planner.   Review of notes prior to home visit indicate that per 05/01/16 office note, Dr Claiborne Billings made medication adjustments to patient's furosemide dose and initiated amlodipine.    Patient and spouse report spouse fills pill box.  Patient denies missed doses.  Patient has all of her medications present during home visit.   Spouse and patient report that at times family friend, Mary Cook, can help them with pill box.    Objective:   Encounter Medications: Outpatient Encounter Prescriptions as of 05/14/2016  Medication Sig  . amLODipine (NORVASC) 5 MG tablet Take 1 tablet (5 mg total) by mouth daily.  Marland Kitchen aspirin 81 MG EC tablet Take 81 mg by mouth daily.    . carvedilol (COREG) 12.5 MG tablet Take 0.5 tablets (6.25 mg total) by mouth 2 (two) times daily with a meal.  . citalopram (CELEXA) 20 MG tablet Take 1 tablet (20 mg total) by mouth daily.  . digoxin (LANOXIN) 0.125 MG tablet Take 0.5 tablets (0.0625 mg total) by mouth daily.  Marland Kitchen donepezil (ARICEPT) 5 MG tablet TAKE 1 TABLET AT BEDTIME  . furosemide (LASIX) 40 MG tablet Take 0.5 tablets (20 mg total) by mouth daily.  . isosorbide mononitrate (IMDUR) 60 MG 24 hr tablet Take 1 tablet (60 mg total) by mouth daily.  Marland Kitchen KLOR-CON M20 20 MEQ tablet TAKE 1 TABLET EVERY DAY  . metFORMIN (GLUCOPHAGE) 500 MG tablet TAKE 1 TABLET TWICE A DAY  . omeprazole (PRILOSEC) 20 MG capsule Take 20 mg by mouth daily.  . sacubitril-valsartan (ENTRESTO) 49-51 MG Take 1 tablet by mouth 2 (two) times daily.   No facility-administered encounter medications on file as of 05/14/2016.     Functional Status: In your present state of health, do you have any difficulty performing the following activities:  01/29/2016 01/29/2016  Hearing? [name]  Vision? N N  Difficulty concentrating or making decisions? [name]  Walking or climbing stairs? Y Y  Dressing or bathing? N N  Doing errands, shopping? [name]  Preparing Food and eating ? N N  Using the Toilet? N N  In the past six months, have you accidently leaked urine? N N  Do you have problems with loss of bowel control? N N  Managing your Medications? Y Y  Managing your Finances? [name]  Housekeeping or managing your Housekeeping? [name]  Some recent data might be hidden    Fall/Depression Screening: PHQ 2/9 Scores 03/14/2016 02/12/2016 01/29/2016 12/28/2015 12/06/2015 10/28/2014  PHQ - 2 Score 2 2 2 1 4  0  PHQ- 9 Score 5 5 5  - 18 -    Assessment:  Medication management:  Donepezil:  Extra dose in pill planner x1 day Amlodipine:  Was not added to pill planner---pill count found 24/30 pills in prescription bottle Furosemide:  Pill planner had one 40 mg tablet in each day   Pill planner was adjusted to have 20 mg furosemide daily and amlodipine 5 mg daily per Dr Evette Georges 05/01/16 office note.    Counseled patient and spouse on continued concerns with mistakes in pill planner and if they would consider having someone else, such as family friend, or private pay agency assist them to ensure accurate medications.  Counseled patient to take medications as prescribed  by her prescribers.    Plan:  Follow-up home visit scheduled for 2 weeks.    Karrie Meres, PharmD, [place] [phone]

## 2016-05-16 ENCOUNTER — Other Ambulatory Visit: Payer: Self-pay | Admitting: Licensed Clinical Social Worker

## 2016-05-16 NOTE — Patient Outreach (Signed)
Assessment:  CSW spoke via phone on 05/16/16 with Mary Cook, family friend of client. Client has given verbal permission for [facility] staff to communicate with Mary Cook about client needs.  CSW verified identity of Mary Cook. Mary Cook reported that client has prescribed medications and is trying to take medications as prescribed. Client is attending scheduled medical appointemnts with her primary care physician, Mary Cook.  CSW and Mary Cook spoke of client care plan. CSW encouraged that client attend all scheduled client medical appointments in next 30 days. Client does enjoy going out on shopping trips with her spouse. She does have support from her spouse.  Client has support from her friend, Mary Cook.  Mary Cook, pharmacist, and CSW have discussed client case and client needs.  Mary Bihari said that spouse of client has difficulty understanding how medication costs of client are billed currently. CSW has talked previously with Mary Cook about care needs of client and about advanced directives needs for client.  Mary Cook and CSW spoke of Guardianship petition process and how a Guardianship hearing is held. Mary Cook said that Mary Cook, spouse of client, does not really want client to go to an Assisted Living facility or Skilled Nursing facility.  Mary Cook said she also thought client would not be able to understand or complete [facility]. CSW thanked Mary Cook for phone call with CSW on 05/16/16. CSW encouraged Mary Cook to call CSW at [phone] as needed to discuss social work needs of client.   Plan:  Client to attend all scheduled client medical appointments in next 30 days.  CSW to call client or Mary Cook in 4 weeks to assess client needs.  Mary Cook.Pascha Fogal MSW, LCSW Licensed Clinical Social Worker [facility] Care Management [phone]

## 2016-05-28 ENCOUNTER — Other Ambulatory Visit: Payer: Self-pay | Admitting: Pharmacist

## 2016-05-28 NOTE — Patient Outreach (Signed)
[place] [facility]) Care Management  [place]   05/28/2016  [facility] [place] 05/21/1926 [id number]  Subjective:  Follow-up home visit with patient for medication review of patient's pill planner.  Arrived at patient's home and there was no answer at door, placed phone call, left a HIPAA compliant message.    Prior to leaving home, placed another phone call, patient answered, and her spouse arrived to the door.   Patient was resting upon arrival, she was able to verify HIPAA details.  Spouse had patient's medication bottles on the table and stated difficulty identifying what a pill was.   He reported difficulty filling pill planner this time.   Patient consented to spouse being present during home visit and spouse is on [facility] Consent form.   Objective:   Encounter Medications: Outpatient Encounter Prescriptions as of 05/28/2016  Medication Sig  . amLODipine (NORVASC) 5 MG tablet Take 1 tablet (5 mg total) by mouth daily.  Marland Kitchen aspirin 81 MG EC tablet Take 81 mg by mouth daily.    . carvedilol (COREG) 12.5 MG tablet Take 0.5 tablets (6.25 mg total) by mouth 2 (two) times daily with a meal.  . citalopram (CELEXA) 20 MG tablet Take 1 tablet (20 mg total) by mouth daily.  . digoxin (LANOXIN) 0.125 MG tablet Take 0.5 tablets (0.0625 mg total) by mouth daily.  Marland Kitchen donepezil (ARICEPT) 5 MG tablet TAKE 1 TABLET AT BEDTIME  . furosemide (LASIX) 40 MG tablet Take 0.5 tablets (20 mg total) by mouth daily.  . isosorbide mononitrate (IMDUR) 60 MG 24 hr tablet Take 1 tablet (60 mg total) by mouth daily.  Marland Kitchen KLOR-CON M20 20 MEQ tablet TAKE 1 TABLET EVERY DAY  . metFORMIN (GLUCOPHAGE) 500 MG tablet TAKE 1 TABLET TWICE A DAY  . omeprazole (PRILOSEC) 20 MG capsule Take 20 mg by mouth daily.  . sacubitril-valsartan (ENTRESTO) 49-51 MG Take 1 tablet by mouth 2 (two) times daily.   No facility-administered encounter medications on file as of 05/28/2016.     Functional Status: In your  present state of health, do you have any difficulty performing the following activities: 01/29/2016 01/29/2016  Hearing? [name]  Vision? N N  Difficulty concentrating or making decisions? [name]  Walking or climbing stairs? Y Y  Dressing or bathing? N N  Doing errands, shopping? [name]  Preparing Food and eating ? N N  Using the Toilet? N N  In the past six months, have you accidently leaked urine? N N  Do you have problems with loss of bowel control? N N  Managing your Medications? Y Y  Managing your Finances? [name]  Housekeeping or managing your Housekeeping? [name]  Some recent data might be hidden    Fall/Depression Screening: PHQ 2/9 Scores 03/14/2016 02/12/2016 01/29/2016 12/28/2015 12/06/2015 10/28/2014  PHQ - 2 Score 2 2 2 1 4  0  PHQ- 9 Score 5 5 5  - 18 -    Assessment:  Medication management:  Pill planner was only partially filled, adherence to medications is difficult to assess at this home visit.  Spouse was willing to have a second weekly pill filled.   Amlodipine was the medication he was trying to identify---it was identified using drug information resources.    Two (7) day pill planners were filled and medications were reviewed with the medication list patient and spouse use that they received from last appointment with Dr Claiborne Billings.    Plan:  Follow-up home visit in 2 weeks  to review pill planners.    Karrie Meres, PharmD, [place] [phone]

## 2016-06-03 ENCOUNTER — Encounter: Payer: Self-pay | Admitting: Internal Medicine

## 2016-06-03 ENCOUNTER — Ambulatory Visit (INDEPENDENT_AMBULATORY_CARE_PROVIDER_SITE_OTHER): Payer: Commercial Managed Care - HMO | Admitting: Internal Medicine

## 2016-06-03 VITALS — BP 150/60 | HR 61 | Temp 98.1°F | Wt 162.0 lb

## 2016-06-03 DIAGNOSIS — M25552 Pain in left hip: Secondary | ICD-10-CM

## 2016-06-03 DIAGNOSIS — G8929 Other chronic pain: Secondary | ICD-10-CM | POA: Diagnosis not present

## 2016-06-03 DIAGNOSIS — E538 Deficiency of other specified B group vitamins: Secondary | ICD-10-CM

## 2016-06-03 DIAGNOSIS — I5043 Acute on chronic combined systolic (congestive) and diastolic (congestive) heart failure: Secondary | ICD-10-CM

## 2016-06-03 DIAGNOSIS — M25559 Pain in unspecified hip: Secondary | ICD-10-CM

## 2016-06-03 DIAGNOSIS — E782 Mixed hyperlipidemia: Secondary | ICD-10-CM

## 2016-06-03 DIAGNOSIS — I255 Ischemic cardiomyopathy: Secondary | ICD-10-CM

## 2016-06-03 DIAGNOSIS — Z23 Encounter for immunization: Secondary | ICD-10-CM

## 2016-06-03 MED ORDER — MEMANTINE HCL-DONEPEZIL HCL ER 28-10 MG PO CP24: 1.0000 | ORAL_CAPSULE | Freq: Every day | ORAL | 11 refills | Status: DC

## 2016-06-03 MED ORDER — METHYLPREDNISOLONE ACETATE 80 MG/ML IJ SUSP
80.0000 mg | Freq: Once | INTRAMUSCULAR | Status: AC
  Administered 2016-06-03: 80 mg via INTRA_ARTICULAR

## 2016-06-03 NOTE — Assessment & Plan Note (Signed)
Statin intolerant 

## 2016-06-03 NOTE — Assessment & Plan Note (Signed)
L hip pain - bursitis Options discussed - see procedure

## 2016-06-03 NOTE — Progress Notes (Signed)
Subjective:  Patient ID: Mary Cook, female    DOB: 1926-06-25  Age: 82 y.o. MRN: [id number]  CC: No chief complaint on file.   HPI [employer] presents for CAD, HTN, CHF, dementia - worse f/u. C/o L hip pain x weeks  Outpatient Medications Prior to Visit  Medication Sig Dispense Refill  . amLODipine (NORVASC) 5 MG tablet Take 1 tablet (5 mg total) by mouth daily. 30 tablet 1  . aspirin 81 MG EC tablet Take 81 mg by mouth daily.      . carvedilol (COREG) 12.5 MG tablet Take 0.5 tablets (6.25 mg total) by mouth 2 (two) times daily with a meal. 90 tablet 3  . citalopram (CELEXA) 20 MG tablet Take 1 tablet (20 mg total) by mouth daily. 90 tablet 2  . digoxin (LANOXIN) 0.125 MG tablet Take 0.5 tablets (0.0625 mg total) by mouth daily. 45 tablet 3  . donepezil (ARICEPT) 5 MG tablet TAKE 1 TABLET AT BEDTIME 90 tablet 2  . furosemide (LASIX) 40 MG tablet Take 0.5 tablets (20 mg total) by mouth daily. 90 tablet 3  . isosorbide mononitrate (IMDUR) 60 MG 24 hr tablet Take 1 tablet (60 mg total) by mouth daily. 90 tablet 3  . KLOR-CON M20 20 MEQ tablet TAKE 1 TABLET EVERY DAY 90 tablet 2  . metFORMIN (GLUCOPHAGE) 500 MG tablet TAKE 1 TABLET TWICE A DAY 180 tablet 2  . omeprazole (PRILOSEC) 20 MG capsule Take 20 mg by mouth daily.    . sacubitril-valsartan (ENTRESTO) 49-51 MG Take 1 tablet by mouth 2 (two) times daily. 180 tablet 3   No facility-administered medications prior to visit.     ROS Review of Systems  Constitutional: Negative for activity change, appetite change, chills, fatigue and unexpected weight change.  HENT: Negative for congestion, mouth sores and sinus pressure.   Eyes: Negative for visual disturbance.  Respiratory: Negative for cough and chest tightness.   Gastrointestinal: Negative for abdominal pain and nausea.  Genitourinary: Negative for difficulty urinating, frequency and vaginal pain.  Musculoskeletal: Negative for back pain and gait problem.    Skin: Negative for pallor and rash.  Neurological: Negative for dizziness, tremors, weakness, numbness and headaches.  Psychiatric/Behavioral: Positive for decreased concentration. Negative for confusion and sleep disturbance. The patient is not nervous/anxious.     Objective:  BP (!) 150/60   Pulse 61   Temp 98.1 F (36.7 C) (Oral)   Wt 162 lb (73.5 kg)   SpO2 96%   BMI 26.96 kg/m   BP Readings from Last 3 Encounters:  06/03/16 (!) 150/60  05/01/16 (!) 182/74  03/22/16 135/63    Wt Readings from Last 3 Encounters:  06/03/16 162 lb (73.5 kg)  05/01/16 167 lb (75.8 kg)  03/22/16 168 lb (76.2 kg)    Physical Exam  Constitutional: She appears well-developed. No distress.  HENT:  Head: Normocephalic.  Right Ear: External ear normal.  Left Ear: External ear normal.  Nose: Nose normal.  Mouth/Throat: Oropharynx is clear and moist.  Eyes: Conjunctivae are normal. Pupils are equal, round, and reactive to light. Right eye exhibits no discharge. Left eye exhibits no discharge.  Neck: Normal range of motion. Neck supple. No JVD present. No tracheal deviation present. No thyromegaly present.  Cardiovascular: Normal rate, regular rhythm and normal heart sounds.   Pulmonary/Chest: No stridor. No respiratory distress. She has no wheezes.  Abdominal: Soft. Bowel sounds are normal. She exhibits no distension and no mass. There is no  tenderness. There is no rebound and no guarding.  Musculoskeletal: She exhibits no edema or tenderness.  Lymphadenopathy:    She has no cervical adenopathy.  Neurological: She displays normal reflexes. No cranial nerve deficit. She exhibits normal muscle tone. Coordination abnormal.  Skin: No rash noted. No erythema.  Psychiatric: She has a normal mood and affect. Her behavior is normal.  confused L lat hip is painful laterally    Procedure Note :     Procedure : Joint Injection,  L  hip   Indication:  Trochanteric bursitis with refractory  chronic  pain.   Risks including unsuccessful procedure , bleeding, infection, bruising, skin atrophy, "steroid flare-up" and others were explained to the patient in detail as well as the benefits. Informed consent was obtained and signed.   Tthe patient was placed in a comfortable lateral decubitus position. The point of maximal tenderness was identified. Skin was prepped with Betadine and alcohol. Then, a 5 cc syringe with a 2 inch long 24-gauge needle was used for a bursa injection.. The needle was advanced  Into the bursa. I injected the bursa with 4 mL of 2% lidocaine and 80 mg of Depo-Medrol .  Band-Aid was applied.   Tolerated well. Complications: None. Good pain relief following the procedure.   Postprocedure instructions :    A Band-Aid should be left on for 12 hours. Injection therapy is not a cure itself. It is used in conjunction with other modalities. You can use nonsteroidal anti-inflammatories like ibuprofen , hot and cold compresses. Rest is recommended in the next 24 hours. You need to report immediately  if fever, chills or any signs of infection develop.    Lab Results  Component Value Date   WBC 6.3 03/22/2016   HGB 10.6 (L) 03/22/2016   HCT 31.5 (L) 03/22/2016   PLT 196 03/22/2016   GLUCOSE 158 (H) 05/08/2016   CHOL 230 (H) 11/16/2015   TRIG 97 11/16/2015   HDL 68 11/16/2015   LDLDIRECT 180.7 12/09/2011   LDLCALC 143 (H) 11/16/2015   ALT 10 05/08/2016   AST 10 05/08/2016   NA 138 05/08/2016   K 4.4 05/08/2016   CL 102 05/08/2016   CREATININE 1.52 (H) 05/08/2016   BUN 21 05/08/2016   CO2 27 05/08/2016   TSH 1.56 10/12/2014   INR 1.01 11/15/2015   HGBA1C 7.3 (H) 03/15/2016    Dg Chest 2 View  Result Date: 03/22/2016 CLINICAL DATA:  Chest pain EXAM: CHEST  2 VIEW COMPARISON:  11/17/2015 FINDINGS: Mild hyperinflation. Heart and mediastinal contours are within normal limits. No focal opacities or effusions. No acute bony abnormality. Atherosclerotic calcifications in  the aortic arch. IMPRESSION: Hyperinflation.  No active disease. Aortic atherosclerosis. Electronically Signed   By: Rolm Baptise M.D.   On: 03/22/2016 18:10   Assessment & Plan:   There are no diagnoses linked to this encounter. I am having Ms. Pidgeon maintain her aspirin, omeprazole, citalopram, sacubitril-valsartan, carvedilol, isosorbide mononitrate, digoxin, KLOR-CON M20, metFORMIN, donepezil, furosemide, and amLODipine.  No orders of the defined types were placed in this encounter.    Follow-up: No Follow-up on file.  Walker Kehr, MD

## 2016-06-03 NOTE — Patient Instructions (Addendum)
Stop Donepezil and start Namzaric    Postprocedure instructions :    A Band-Aid should be left on for 12 hours. Injection therapy is not a cure itself. It is used in conjunction with other modalities. You can use nonsteroidal anti-inflammatories like ibuprofen , hot and cold compresses. Rest is recommended in the next 24 hours. You need to report immediately  if fever, chills or any signs of infection develop.

## 2016-06-03 NOTE — Addendum Note (Signed)
Addended by: Cresenciano Lick on: 06/03/2016 02:36 PM   Modules accepted: Orders

## 2016-06-03 NOTE — Progress Notes (Signed)
Pre visit review using our clinic review tool, if applicable. No additional management support is needed unless otherwise documented below in the visit note. 

## 2016-06-03 NOTE — Assessment & Plan Note (Signed)
Risks associated with treatment noncompliance were discussed. Compliance was encouraged. 

## 2016-06-03 NOTE — Assessment & Plan Note (Signed)
On Coreg, Digoxin, Isosorbide, Lasix, Entresto Risks associated with treatment noncompliance were discussed. Compliance was encouraged.

## 2016-06-04 ENCOUNTER — Ambulatory Visit (INDEPENDENT_AMBULATORY_CARE_PROVIDER_SITE_OTHER): Payer: Commercial Managed Care - HMO | Admitting: Cardiovascular Disease

## 2016-06-04 ENCOUNTER — Encounter: Payer: Self-pay | Admitting: Cardiovascular Disease

## 2016-06-04 VITALS — BP 120/58 | HR 59 | Ht 65.0 in | Wt 163.6 lb

## 2016-06-04 DIAGNOSIS — N183 Chronic kidney disease, stage 3 unspecified: Secondary | ICD-10-CM

## 2016-06-04 DIAGNOSIS — I509 Heart failure, unspecified: Secondary | ICD-10-CM

## 2016-06-04 DIAGNOSIS — E1159 Type 2 diabetes mellitus with other circulatory complications: Secondary | ICD-10-CM

## 2016-06-04 DIAGNOSIS — I2510 Atherosclerotic heart disease of native coronary artery without angina pectoris: Secondary | ICD-10-CM | POA: Diagnosis not present

## 2016-06-04 DIAGNOSIS — E782 Mixed hyperlipidemia: Secondary | ICD-10-CM

## 2016-06-04 DIAGNOSIS — Z9861 Coronary angioplasty status: Secondary | ICD-10-CM

## 2016-06-04 DIAGNOSIS — I10 Essential (primary) hypertension: Secondary | ICD-10-CM

## 2016-06-04 DIAGNOSIS — Z79899 Other long term (current) drug therapy: Secondary | ICD-10-CM

## 2016-06-04 DIAGNOSIS — I255 Ischemic cardiomyopathy: Secondary | ICD-10-CM

## 2016-06-04 NOTE — Patient Instructions (Signed)
Your physician recommends that you return for lab work in: 2 months  Your physician recommends that you schedule a follow-up appointment in: 3 months   

## 2016-06-04 NOTE — Progress Notes (Signed)
Patient ID: Mary Cook, female   DOB: [birth year], 82 y.o.   MRN: [id number]     HPI: Mary Cook is a 82 y.o. female who presents to the office today for a 1 month follow up cardiology evaluation.  She was recently hospitalized with CHF.  Mary Cook has known CAD and in September 2011 suffered a large out of hospital anterior wall myocardial infarction, complicated by CHF and post MI pericarditis.  At that time, she had significant thrombus burden requiring thrombectomy.  She underwent stenting of her left circumflex coronary artery with tandem 3.015 mm Promus stents postdilated 3.5 mm meters.  She had concomitant CAD involving her diagonal branch of her LAD as well as RCA.  Last year she was readmitted to the hospital with increasing shortness of breath.  A nuclear study which demonstrated inferior apical ischemia. On 06/08/2014,  repeat cardiac catheterization by me showed moderate LV dysfunction with global hypokinesis and slightly more pronounced inferior hypocontractility.  Ejection fraction was 35%.  There was moderate CAD with 50-60% smooth eccentric stenosis in the first diagonal branch of the LAD, widely patent proximal tandem stents in the circumflex vessel with a focal 70% stenosis of a small AV groove circumflex, and 50-60% stenosis in the proximal RCA with mild mid systolic bridging in a dominant RCA system.  Increased medical therapy was recommended.  She also has a history of obstructive sleep apnea and had been using CPAP therapy.  Presently, she admits to 100% compliance.  She is unaware of breakthrough snoring.  She is diabetic on metformin 500 mg twice a day.  She also has a history of hyperlipidemia for which she has been taking atorvastatin 40 mg.  There is a history of hypertension for which she has been on furosemide 40 mg in addition to her isosorbide.  She  sustained a fall and was evaluated in the emergency room by Dr. Fuller Song in October 2015.  CT of her head  was negative, although she had chronic white matter ischemic change and diffuse severe degenerative changes.  She was also noted to have carotid atherosclerotic vascular disease.  When I saw her in October 2016 since she had had several recent admissions with  acute on chronic combined systolic and diastolic heart failure and had an ejection fraction of 30-35% , I recommended initiation of Entresto at 24/26 and set up a follow up appointment to see Mary Cook, PharmD several weeks later for dose titration.  She stated that she had felt better when she had taken the medicine, but never followed up with the subsequent evaluation and consequently stopped taking the medicine.  Once the samples ran out.  When I saw her last in January 2017, I initiated entresto and provided her with 2 weeks of 24/26 and then titrated her to 49/51.  She states that she has felt fairly well with this medical regimen.  She denies recent significant change in symptomatology with reference to shortness of breath.  Lab work weeks ago revealed a creatinine of 1.17.  She had normal LFTs.  Her potassium was 3.5.  She had undergone a nuclear perfusion study on 10/29/2015 which showed findings consistent with prior MI with very mild peri-infarction ischemia involving the inferolateral wall.  She denies chest pressure.  She is unaware of palpitations.  She denies PND, orthopnea.    Since I saw her, she was hospitalized in March , she had not taken her medicine and had acute on chronic combined systolic and diastolic heart  failure, which subsided.  She now has been back on her medication.  She saw Mary Cook in follow-up.  Whe I last saw her, I had reviewed her records from her emergency room evaluation in July which had shown worsening renal function and her creatinine had risen to 2.31.  Apparently at that time she was taking furosemide 40 mg twice a day and I reduced this to 20 mg daily.  He was hypertensive and I added amlodipine 5 mg  subsequent blood work one week later showed significant improvement with a BUN of 21 and a creatinine of 1.52.  Potassium was 4.4.  She had normal LFTs.  Her BNP was elevated at 922, but significantly improved from 1963 from May 2017.  Over the past several weeks she has felt well.  She has more energy.  She continues to diurese well.  Denies any PND or orthopnea.  Past Medical History:  Diagnosis Date  . CAD (coronary artery disease)    a. 04/2010 MI DES x 2 to LCX;  b. 05/2014 Cath: EF 35%, patent LCX stents w/ 70% stenosis in small AV groove LCX, Diag 50-60, RCA 50-60p->Med Rx.  . Cerebral aneurysm   . Chronic combined systolic (congestive) and diastolic (congestive) heart failure    a. 01/2015 Echo: EF 30-35%, sev inflat HK, Gr 1DD, mild AI.  Marland Kitchen Diverticulosis of colon   . Essential hypertension   . Frequent urination   . GIB (gastrointestinal bleeding)    a. 2002 Diverticular bleed.  . Hyperlipidemia   . Ischemic cardiomyopathy    a. 01/2015 Echo: EF 30-35%.  . Osteoarthritis   . Osteopenia   . Skin disorder   . Sleep apnea    cpap therapy  . Stroke ([place]) 2002  . T12 compression fracture ([place])    a. 04/2015.  Marland Kitchen Thrombus   . Type II diabetes mellitus ([place])   . Visual disorder     Past Surgical History:  Procedure Laterality Date  . ABDOMINAL HYSTERECTOMY  1980   no bso  . APPENDECTOMY    . CARDIAC CATHETERIZATION  07/2011   LAD OK, D1 80%, CFX 30% w/ patent stent, RCA 50-60%, EF 40%  . CARDIAC CATHETERIZATION    . CORONARY ANGIOPLASTY WITH STENT PLACEMENT  05/11/2010   stent CFX  . DOPPLER ECHOCARDIOGRAPHY    . LEFT HEART CATHETERIZATION WITH CORONARY ANGIOGRAM N/A 08/18/2011   Procedure: LEFT HEART CATHETERIZATION WITH CORONARY ANGIOGRAM;  Surgeon: Wellington Hampshire, MD;  Location: [place] CATH LAB;  Service: Cardiovascular;  Laterality: N/A;  . LEFT HEART CATHETERIZATION WITH CORONARY ANGIOGRAM N/A 06/08/2014   Procedure: LEFT HEART CATHETERIZATION WITH CORONARY ANGIOGRAM;   Surgeon: Troy Sine, MD;  Location: [facility] CATH LAB;  Service: Cardiovascular;  Laterality: N/A;  . myocardial perfusion study    . THROMBECTOMY    . TONSILLECTOMY      Allergies  Allergen Reactions  . Clopidogrel Bisulfate Nausea And Vomiting    Patient is not aware of this allergy  . Lipitor [Atorvastatin] Other (See Comments)    Weak legs  . Lisinopril Cough    Patient is not aware of this allergy    Current Outpatient Prescriptions  Medication Sig Dispense Refill  . amLODipine (NORVASC) 5 MG tablet Take 1 tablet (5 mg total) by mouth daily. 30 tablet 1  . aspirin 81 MG EC tablet Take 81 mg by mouth daily.      . carvedilol (COREG) 12.5 MG tablet Take 0.5 tablets (6.25 mg total)  by mouth 2 (two) times daily with a meal. 90 tablet 3  . citalopram (CELEXA) 20 MG tablet Take 1 tablet (20 mg total) by mouth daily. 90 tablet 2  . furosemide (LASIX) 40 MG tablet Take 0.5 tablets (20 mg total) by mouth daily. 90 tablet 3  . isosorbide mononitrate (IMDUR) 60 MG 24 hr tablet Take 1 tablet (60 mg total) by mouth daily. 90 tablet 3  . KLOR-CON M20 20 MEQ tablet TAKE 1 TABLET EVERY DAY 90 tablet 2  . Memantine HCl-Donepezil HCl (NAMZARIC) 28-10 MG CP24 Take 1 tablet by mouth daily. 30 capsule 11  . metFORMIN (GLUCOPHAGE) 500 MG tablet TAKE 1 TABLET TWICE A DAY 180 tablet 2  . omeprazole (PRILOSEC) 20 MG capsule Take 20 mg by mouth daily.    . sacubitril-valsartan (ENTRESTO) 49-51 MG Take 1 tablet by mouth 2 (two) times daily. 180 tablet 3   No current facility-administered medications for this visit.     Social History   Social History  . Marital status: Married    Spouse name: N/A  . Number of children: N/A  . Years of education: N/A   Occupational History  . Retired    Social History Main Topics  . Smoking status: Never Smoker  . Smokeless tobacco: Never Used  . Alcohol use No  . Drug use: No  . Sexual activity: Not on file   Other Topics Concern  . Not on file   Social  History Narrative  . No narrative on file    Family History  Problem Relation Age of Onset  . Hypertension Other   . CAD      father    ROS General: Negative; No fevers, chills, or night sweats HEENT: Negative; No changes in vision or hearing, sinus congestion, difficulty swallowing Pulmonary: Negative; No cough, wheezing, shortness of breath, hemoptysis Cardiovascular: See HPI:  GI: positive for GERD; No nausea, vomiting, diarrhea, or abdominal pain GU: Negative; No dysuria, hematuria, or difficulty voiding Musculoskeletal: degenerative changes to her cervical spine.  Arthritis;  Hematologic: Negative; no easy bruising, bleeding Endocrine: positive for diabetes mellitus Neuro: Negative; no changes in balance, headaches Skin: Negative; No rashes or skin lesions Psychiatric: Negative; No behavioral problems, depression Sleep: Negative; No snoring,  daytime sleepiness, hypersomnolence, bruxism, restless legs, hypnogognic hallucinations. Other comprehensive 14 point system review is negative   Physical Exam BP (!) 120/58 (BP Location: Right Arm, Patient Position: Sitting, Cuff Size: Normal)   Pulse (!) 59   Ht _0  (1.651 m)   Wt 163 lb 9.6 oz (74.2 kg)   BMI 27.22 kg/m   Repeat blood pressure by me was 130/82  Wt Readings from Last 3 Encounters:  06/04/16 163 lb 9.6 oz (74.2 kg)  06/03/16 162 lb (73.5 kg)  05/01/16 167 lb (75.8 kg)   General: Alert, oriented, no distress.  Skin: normal turgor, no rashes, warm and dry HEENT: Normocephalic, atraumatic. Pupils equal round and reactive to light; sclera anicteric; extraocular muscles intact, No lid lag; Nose without nasal septal hypertrophy; Mouth/Parynx benign; Mallinpatti scale 3 Neck: No JVD, no carotid bruits; normal carotid upstroke Lungs: clear to ausculatation and percussion bilaterally; no wheezing or rales, normal inspiratory and expiratory effort Chest wall: without tenderness to palpitation Heart: PMI not  displaced, RRR, s1 s2 normal, 1/6 systolic murmur, No diastolic murmur, no rubs, gallops, thrills, or heaves Abdomen: soft, nontender; no hepatosplenomehaly, BS+; abdominal aorta nontender and not dilated by palpation. Back: no CVA tenderness Pulses: 2+  Musculoskeletal: full range of motion, normal strength, no joint deformities Extremities: Pulses 2+, no clubbing cyanosis or edema, Homan's sign negative  Neurologic: grossly nonfocal; Cranial nerves grossly wnl Psychologic: Normal mood and affect  ECG (independently read by me): Sinus bradycardia 59 bpm.  LVH left axis deviation.  September 2016 ECG (independently read by me): Normal sinus rhythm at 76 bpm.  LVH by voltage criteria.  January 2017 ECG (independently read by me):  Normal sinus rhythm at 73 bpm.  LVH.  Nonspecific T changes.  ECG (independently read by me): Normal sinus rhythm at 68 bpm.  H by voltage.  T-wave abnormality in leads 1 and aVL.  January 2016 ECG (independently read by me): Normal sinus rhythm at 68 bpm.  LVH by voltage in aVL.  Nonspecific T changes.  November 2015 ECG (independently read by me): sinus bradycardia 59 bpm.  LVH with repolarization changes.  Nonspecific T changes.   LABS: BMP Latest Ref Rng & Units 05/08/2016 03/22/2016 03/15/2016  Glucose 65 - 99 mg/dL 158(H) 175(H) 222(H)  BUN 7 - 25 mg/dL 21 31(H) 33(H)  Creatinine 0.60 - 0.88 mg/dL 1.52(H) 2.31(H) 1.93(H)  Sodium 135 - 146 mmol/L 138 136 138  Potassium 3.5 - 5.3 mmol/L 4.4 4.2 4.5  Chloride 98 - 110 mmol/L 102 104 100  CO2 20 - 31 mmol/L _0 Calcium 8.6 - 10.4 mg/dL 9.0 8.9 9.3   Hepatic Function Latest Ref Rng & Units 05/08/2016 03/15/2016 11/14/2015  Total Protein 6.1 - 8.1 g/dL 6.6 7.0 7.1  Albumin 3.6 - 5.1 g/dL 3.6 3.6 3.3(L)  AST 10 - 35 U/L _1 ALT 6 - 29 U/L _2 Alk Phosphatase 33 - 130 U/L 78 80 82  Total Bilirubin 0.2 - 1.2 mg/dL 0.5 0.4 0.5  Bilirubin, Direct 0.0 - 0.3 mg/dL - 0.0 -   CBC Latest Ref Rng &  Units 03/22/2016 11/16/2015 11/14/2015  WBC 4.0 - 10.5 K/uL 6.3 6.5 11.2(H)  Hemoglobin 12.0 - 15.0 g/dL 10.6(L) 11.3(L) 12.2  Hematocrit 36.0 - 46.0 % 31.5(L) 34.8(L) 37.1  Platelets 150 - 400 K/uL 196 167 186   Lab Results  Component Value Date   MCV 92.6 03/22/2016   MCV 89.9 11/16/2015   MCV 91.4 11/14/2015   Lab Results  Component Value Date   TSH 1.56 10/12/2014   Lab Results  Component Value Date   HGBA1C 7.3 (H) 03/15/2016   BNP (last 3 results)  Recent Labs  11/14/15 2300 11/17/15 0350  BNP 515.6* 196.9*    ProBNP (last 3 results)  Recent Labs  01/01/16 1623 05/08/16 1700  PROBNP 1,963.00* 922.00*    Lipid Panel     Component Value Date/Time   CHOL 230 (H) 11/16/2015 0240   TRIG 97 11/16/2015 0240   HDL 68 11/16/2015 0240   CHOLHDL 3.4 11/16/2015 0240   VLDL 19 11/16/2015 0240   LDLCALC 143 (H) 11/16/2015 0240   LDLDIRECT 180.7 12/09/2011 1109    RADIOLOGY: No results found.    ASSESSMENT AND PLAN: Mrs. Inoue  is an 82 year-old female who suffered a left circumflex MI in 2011 and underwent tandem stenting of her proximal circumflex vessel.  Cardiac catheterization on 06/08/2014 showed moderate LV dysfunction with moderate multivessel CAD but widely patent tandem stents in the circumflex vessel.  An echo Doppler study in June 2016 showed an ejection fraction at 30-35% with severe LVH.  There was grade 1 diastolic dysfunction and she had abnormal  tissue Doppler suggesting high ventricular filling pressure;  mild aortic insufficiency and mitral annular calcification.  She has had several readmissions with acute on chronic combined systolic and diastolic heart failure.  She had been started on entresto in addition to her diuretic regimen and initially had felt improved.  When I last saw her, I was concerned that she was over diuresed, which undoubtedly contributed to her renal insufficiency.  Apparently she had been taking her furosemide sometimes 3  times a day.  On her reduced dose of just 20 mg daily.  Her renal function improved with a creatinine of 1.52  3 weeks ago.  She continues to take enresto at 49/51 in addition to amlodipine 5 mg, carvedilol is 0.25 mg twice a day, and isosorbide mononitrate.  She's not having any symptoms of PND, orthopnea.  Overtly, she is not in CHF.  Her BMP several weeks ago was elevated but improved.  She is not having any anginal symptoms.  I have recommended repeat blood work in 6-8 weeks.  I'll see her in the office in follow-up and further recommendations will be made at that time.    Time spent: 25 minutes  Troy Sine, MD, [facility]  06/04/2016 5:30 PM

## 2016-06-11 ENCOUNTER — Other Ambulatory Visit: Payer: Self-pay | Admitting: Pharmacist

## 2016-06-11 NOTE — Patient Outreach (Signed)
[place] [facility]) Care Management  06/11/2016  Kruti Gruenhagen [facility] [birth year] [id number]  [place] home visit was scheduled with patient for this morning.  Arrived at patient's residence and there was no answer at the door after knocking.  Placed call to patient at 0910 and left a HIPAA compliant message requesting call back.   Placed another phone call to patient at [phone] and there was no answer.  Left the residence at [phone] as there was no answer at the door and no return call from patient.   At last home visit there was also difficulty getting patient to answer door/phone.    Plan:  Will attempt to make a phone outreach to patient to reschedule.   Karrie Meres, PharmD, [place] [phone]

## 2016-06-12 ENCOUNTER — Other Ambulatory Visit: Payer: Self-pay | Admitting: Pharmacist

## 2016-06-12 NOTE — Patient Outreach (Signed)
[place] [facility]) Care Management  06/12/2016  Minakshi Nordine [facility] April 24, 1926 [id number]  Unsuccessful phone outreach to patient to attempt to reschedule home visit from 06/11/16 following patient no show.    No answer, left a HIPAA complaint voicemail requesting a return call.   Plan:  Will make another outreach attempt next week if no return call from patient.   Karrie Meres, PharmD, [place] [phone]

## 2016-06-13 ENCOUNTER — Ambulatory Visit: Payer: Commercial Managed Care - HMO | Admitting: Pharmacist

## 2016-06-17 ENCOUNTER — Other Ambulatory Visit: Payer: Self-pay | Admitting: Pharmacist

## 2016-06-17 NOTE — Patient Outreach (Signed)
[place] [facility]) Care Management  06/17/2016  Kristee Holness [facility] October 09, 1925 [id number]  Successful phone outreach to patient.  Her spouse, on [facility] Consent form answered and verified patient's name and date of birth.  Purpose of call was to re-schedule home visit which patient did not answer door for last week.  Previous phone calls and messages were not returned.    Spouse reports he filled patient's pill box, but would appreciate home visit to review pill box.   Plan:  Home visit scheduled for later this week.  Reminded patient's spouse of importance of contacting [facility] Pharmacist if home visit needs to be re-scheduled.    Karrie Meres, PharmD, [place] [phone]

## 2016-06-18 ENCOUNTER — Other Ambulatory Visit: Payer: Self-pay | Admitting: Licensed Clinical Social Worker

## 2016-06-18 NOTE — Patient Outreach (Signed)
Assessment:  CSW spoke via phone with Mary Cook, friend and caretaker for client. CSW verified identity of Mary Cook. CSW received verbal permission from Mary Cook on 06/18/16 for CSW to speak with Mary Cook about current needs of client. Client has also given permission for [facility] staff to speak with Mary Cook regarding needs of client.  Client resides at home with her spouse. Client and spouse like to go out for meals and often like to go shopping in the community. Mary Cook, [facility] pharmacist, has been working to provide [place] support for client.  Client has prescribed medications; but client sometime has difficulty taking medications as prescribed. Mary Cook, friend and caregiver tries to assist client, as able, in meeting daily needs of client.  Client does have Alzheimer's disease. Client does desire to remain at her home at present.  Her spouse drives client to and from client medical appointments. CSW and Mary Cook spoke of client care plan. CSW encouraged that client attend all scheduled client medical appointments in next 30 days.  Mary Cook said client is attending appointments with her primary doctor, Dr. Alain Marion.  CSW has talked with Mary Cook about [employer] process for client. Mary Cook has said that Mary Cook, spouse of client, does not wish for client to go to an Assisted Living facility or Skilled Nursing facility. Mary Cook said client fell recently but did not have a serious injury. She said she encouraged Mary Cook, spouse of client to call primary doctor to inform primary doctor of client's fall. Mary Cook said she called client and her spouse yesterday and today to check on client status.  CSW thanked [place] for update information. CSW encouraged Mary Cook or Mary Cook to call CSW at [phone] as needed to address social work needs of client.   Plan:  Client to attend all scheduled client medical appointments in the next 30 days.  CSW to collaborate with Mary Cook, [facility] pharmacist, in  monitoring needs of client.  CSW to call client or Mary Cook in 4 weeks to assess client needs.   Norva Riffle.Mychael Soots MSW, LCSW Licensed Clinical Social Worker [facility] Care Management [phone]

## 2016-06-19 ENCOUNTER — Encounter (HOSPITAL_COMMUNITY): Payer: Self-pay | Admitting: *Deleted

## 2016-06-19 ENCOUNTER — Other Ambulatory Visit: Payer: Self-pay | Admitting: Pharmacist

## 2016-06-19 ENCOUNTER — Emergency Department (HOSPITAL_COMMUNITY): Payer: Commercial Managed Care - HMO

## 2016-06-19 ENCOUNTER — Emergency Department (HOSPITAL_COMMUNITY)
Admission: EM | Admit: 2016-06-19 | Discharge: 2016-06-19 | Disposition: A | Discharge status: Home / Self Care | Payer: Commercial Managed Care - HMO | Attending: Emergency Medicine | Admitting: Emergency Medicine

## 2016-06-19 DIAGNOSIS — R404 Transient alteration of awareness: Secondary | ICD-10-CM | POA: Diagnosis not present

## 2016-06-19 DIAGNOSIS — N183 Chronic kidney disease, stage 3 (moderate): Secondary | ICD-10-CM | POA: Insufficient documentation

## 2016-06-19 DIAGNOSIS — I130 Hypertensive heart and chronic kidney disease with heart failure and stage 1 through stage 4 chronic kidney disease, or unspecified chronic kidney disease: Secondary | ICD-10-CM | POA: Diagnosis not present

## 2016-06-19 DIAGNOSIS — I2510 Atherosclerotic heart disease of native coronary artery without angina pectoris: Secondary | ICD-10-CM | POA: Insufficient documentation

## 2016-06-19 DIAGNOSIS — Z79899 Other long term (current) drug therapy: Secondary | ICD-10-CM | POA: Diagnosis not present

## 2016-06-19 DIAGNOSIS — I5043 Acute on chronic combined systolic (congestive) and diastolic (congestive) heart failure: Secondary | ICD-10-CM | POA: Insufficient documentation

## 2016-06-19 DIAGNOSIS — R197 Diarrhea, unspecified: Secondary | ICD-10-CM | POA: Insufficient documentation

## 2016-06-19 DIAGNOSIS — E1122 Type 2 diabetes mellitus with diabetic chronic kidney disease: Secondary | ICD-10-CM | POA: Diagnosis not present

## 2016-06-19 DIAGNOSIS — R001 Bradycardia, unspecified: Secondary | ICD-10-CM | POA: Diagnosis not present

## 2016-06-19 DIAGNOSIS — R112 Nausea with vomiting, unspecified: Secondary | ICD-10-CM | POA: Insufficient documentation

## 2016-06-19 DIAGNOSIS — Z7982 Long term (current) use of aspirin: Secondary | ICD-10-CM | POA: Insufficient documentation

## 2016-06-19 DIAGNOSIS — Z7984 Long term (current) use of oral hypoglycemic drugs: Secondary | ICD-10-CM | POA: Diagnosis not present

## 2016-06-19 DIAGNOSIS — I499 Cardiac arrhythmia, unspecified: Secondary | ICD-10-CM | POA: Diagnosis not present

## 2016-06-19 LAB — I-STAT TROPONIN, ED: TROPONIN I, POC: 0.01 ng/mL (ref 0.00–0.08)

## 2016-06-19 LAB — COMPREHENSIVE METABOLIC PANEL
ALBUMIN: 3.6 g/dL (ref 3.5–5.0)
ALT: 26 U/L (ref 14–54)
AST: 25 U/L (ref 15–41)
Alkaline Phosphatase: 68 U/L (ref 38–126)
Anion gap: 8 (ref 5–15)
BUN: 50 mg/dL — AB (ref 6–20)
CHLORIDE: 102 mmol/L (ref 101–111)
CO2: 26 mmol/L (ref 22–32)
Calcium: 9.3 mg/dL (ref 8.9–10.3)
Creatinine, Ser: 2.25 mg/dL — ABNORMAL HIGH (ref 0.44–1.00)
GFR calc Af Amer: 21 mL/min — ABNORMAL LOW (ref >60)
GFR calc non Af Amer: 18 mL/min — ABNORMAL LOW (ref >60)
GLUCOSE: 220 mg/dL — AB (ref 65–99)
POTASSIUM: 4.5 mmol/L (ref 3.5–5.1)
SODIUM: 136 mmol/L (ref 135–145)
Total Bilirubin: 0.7 mg/dL (ref 0.3–1.2)
Total Protein: 7.2 g/dL (ref 6.5–8.1)

## 2016-06-19 LAB — URINALYSIS, ROUTINE W REFLEX MICROSCOPIC
Bilirubin Urine: NEGATIVE
GLUCOSE, UA: NEGATIVE mg/dL
HGB URINE DIPSTICK: NEGATIVE
KETONES UR: NEGATIVE mg/dL
Nitrite: NEGATIVE
PROTEIN: NEGATIVE mg/dL
Specific Gravity, Urine: 1.02 (ref 1.005–1.030)
pH: 5.5 (ref 5.0–8.0)

## 2016-06-19 LAB — LIPASE, BLOOD: LIPASE: 83 U/L — AB (ref 11–51)

## 2016-06-19 LAB — URINE MICROSCOPIC-ADD ON

## 2016-06-19 LAB — CBC
HEMATOCRIT: 38.1 % (ref 36.0–46.0)
HEMOGLOBIN: 12.4 g/dL (ref 12.0–15.0)
MCH: 30.1 pg (ref 26.0–34.0)
MCHC: 32.5 g/dL (ref 30.0–36.0)
MCV: 92.5 fL (ref 78.0–100.0)
Platelets: 202 10*3/uL (ref 150–400)
RBC: 4.12 MIL/uL (ref 3.87–5.11)
RDW: 13.3 % (ref 11.5–15.5)
WBC: 8.4 10*3/uL (ref 4.0–10.5)

## 2016-06-19 LAB — C DIFFICILE QUICK SCREEN W PCR REFLEX
C DIFFICILE (CDIFF) INTERP: NOT DETECTED
C Diff antigen: NEGATIVE
C Diff toxin: NEGATIVE

## 2016-06-19 LAB — DIFFERENTIAL
Basophils Absolute: 0 10*3/uL (ref 0.0–0.1)
Basophils Relative: 0 %
EOS PCT: 1 %
Eosinophils Absolute: 0.1 10*3/uL (ref 0.0–0.7)
LYMPHS ABS: 1.7 10*3/uL (ref 0.7–4.0)
LYMPHS PCT: 21 %
MONO ABS: 0.7 10*3/uL (ref 0.1–1.0)
Monocytes Relative: 8 %
Neutro Abs: 5.6 10*3/uL (ref 1.7–7.7)
Neutrophils Relative %: 70 %

## 2016-06-19 MED ORDER — SODIUM CHLORIDE 0.9 % IV BOLUS (SEPSIS)
250.0000 mL | Freq: Once | INTRAVENOUS | Status: AC
  Administered 2016-06-19: 250 mL via INTRAVENOUS

## 2016-06-19 MED ORDER — ONDANSETRON HCL 4 MG/2ML IJ SOLN
4.0000 mg | Freq: Once | INTRAMUSCULAR | Status: AC
  Administered 2016-06-19: 4 mg via INTRAVENOUS
  Filled 2016-06-19: qty 2

## 2016-06-19 NOTE — ED Triage Notes (Signed)
Pt comes in by EMS from home for n/v/d. Per EMS, family stated patient hasn't eaten or had anything to drink in 2 days. Pt has hx of dementia, pt not talking to staff at this time. Pt has eyes open.

## 2016-06-19 NOTE — ED Notes (Signed)
MD at bedside. 

## 2016-06-19 NOTE — ED Provider Notes (Signed)
[facility] DEPT Provider Note   CSN: [id number] Arrival date & time: 06/19/16  1108  By signing my name below, I, Higinio Plan, attest that this documentation has been prepared under the direction and in the presence of Forde Dandy, MD . Electronically Signed: Higinio Plan, Scribe. 06/19/2016. 12:39 PM.  History   Chief Complaint Chief Complaint  Patient presents with  . Emesis   The history is provided by a relative. No language interpreter was used.   HPI Comments: Level V caveat due to dementia. Mary Cook is a 82 y.o. female with PMHx of dementia, CAD and Type II DM, who presents to the Emergency Department for an evaluation of nausea and diarrhea that began 2 days ago. Per husband, pt was at her baseline last night but awoke this morning "feeling sick." He reports he brought pt to the kitchen table this morning to feed her some food in which she ate a few bites and then began "dry heaving." He states pt's nurse entered the room and pt suddenly started having tremoring and decreased responsiveness. States for initial few minutes, she would not answer his questions. Pt's husband notes decreased appetite in which she has not had anything to eat or drink for the past 2 days. He reports associated nausea and vomiting this past week and states pt also had diarrhea yesterday and another episode today while in the ED. Pt denies abdominal pain, chest pain, shortness of breath, fever, chills, cough and sick contacts.   Past Medical History:  Diagnosis Date  . CAD (coronary artery disease)    a. 04/2010 MI DES x 2 to LCX;  b. 05/2014 Cath: EF 35%, patent LCX stents w/ 70% stenosis in small AV groove LCX, Diag 50-60, RCA 50-60p->Med Rx.  . Cerebral aneurysm   . Chronic combined systolic (congestive) and diastolic (congestive) heart failure    a. 01/2015 Echo: EF 30-35%, sev inflat HK, Gr 1DD, mild AI.  Marland Kitchen Diverticulosis of colon   . Essential hypertension   . Frequent urination   . GIB  (gastrointestinal bleeding)    a. 2002 Diverticular bleed.  . Hyperlipidemia   . Ischemic cardiomyopathy    a. 01/2015 Echo: EF 30-35%.  . Osteoarthritis   . Osteopenia   . Skin disorder   . Sleep apnea    cpap therapy  . Stroke ([place]) 2002  . T12 compression fracture ([place])    a. 04/2015.  Marland Kitchen Thrombus   . Type II diabetes mellitus ([place])   . Visual disorder     Patient Active Problem List   Diagnosis Date Noted  . Hip pain, chronic 06/03/2016  . Dyspnea 11/15/2015  . Acute respiratory failure with hypoxia ([place]) 11/15/2015  . CHF exacerbation ([place]) 11/15/2015  . Pain in the chest   . Renal insufficiency 06/02/2015  . Chronic systolic CHF (congestive heart failure) ([place])   . Ischemic cardiomyopathy   . T12 compression fracture ([place])   . Intertrigo 02/28/2015  . Actinic keratoses 02/28/2015  . Noncompliance with diet and medication regimen 02/28/2015  . LBP (low back pain) 02/13/2015  . Acute on chronic renal insufficiency 02/04/2015  . Cardiomyopathy, ischemic-EF 30-35% by echo 01/31/15 02/04/2015  . Chronic kidney disease stage III 02/03/2015  . Acute on chronic combined systolic and diastolic CHF, NYHA class 3 ([place]) 01/30/2015  . Cystitis 10/28/2014  . Insomnia 10/28/2014  . Alzheimer's disease 10/28/2014  . Falls frequently 10/12/2014  . Leg weakness, bilateral 10/12/2014  . OSA on CPAP 09/05/2014  .  Rapid palpitations 06/07/2014  . Acute chest pain 06/06/2014  . Chest pain 06/06/2014  . Thoracic back pain after fall  04/13/2013  . Stress at home 01/26/2013  . GERD (gastroesophageal reflux disease) 01/25/2013  . Abnormal EKG, marked new ant TWI this adm with negative Troponins,08/18/11 08/20/2011  . CAD S/P CFX PCI Sept 2011 08/18/2011  . Sleep apnea, cpap had been stopped secondary to "sinus issues" 08/18/2011  . [name] deficiency 03/20/2010  . Diabetes mellitus ([place]) 01/07/2008  . Hyperlipidemia 01/07/2008  . Essential hypertension 04/25/2007  . DIVERTICULOSIS, COLON  04/25/2007  . OSTEOARTHRITIS 04/25/2007  . Disorder of bone and cartilage 04/25/2007  . DIVERTICULITIS, HX OF 04/25/2007    Past Surgical History:  Procedure Laterality Date  . ABDOMINAL HYSTERECTOMY  1980   no bso  . APPENDECTOMY    . CARDIAC CATHETERIZATION  07/2011   LAD OK, D1 80%, CFX 30% w/ patent stent, RCA 50-60%, EF 40%  . CARDIAC CATHETERIZATION    . CORONARY ANGIOPLASTY WITH STENT PLACEMENT  05/11/2010   stent CFX  . DOPPLER ECHOCARDIOGRAPHY    . LEFT HEART CATHETERIZATION WITH CORONARY ANGIOGRAM N/A 08/18/2011   Procedure: LEFT HEART CATHETERIZATION WITH CORONARY ANGIOGRAM;  Surgeon: Wellington Hampshire, MD;  Location: [facility] CATH LAB;  Service: Cardiovascular;  Laterality: N/A;  . LEFT HEART CATHETERIZATION WITH CORONARY ANGIOGRAM N/A 06/08/2014   Procedure: LEFT HEART CATHETERIZATION WITH CORONARY ANGIOGRAM;  Surgeon: Troy Sine, MD;  Location: [facility] CATH LAB;  Service: Cardiovascular;  Laterality: N/A;  . myocardial perfusion study    . THROMBECTOMY    . TONSILLECTOMY      OB History    No data available     Home Medications    Prior to Admission medications   Medication Sig Start Date End Date Taking? Authorizing Provider  amLODipine (NORVASC) 5 MG tablet Take 1 tablet (5 mg total) by mouth daily. 05/01/16 07/30/16 Yes Troy Sine, MD  aspirin 81 MG EC tablet Take 81 mg by mouth daily.     Yes Historical Provider, MD  carvedilol (COREG) 12.5 MG tablet Take 0.5 tablets (6.25 mg total) by mouth 2 (two) times daily with a meal. 01/11/16  Yes Troy Sine, MD  citalopram (CELEXA) 20 MG tablet Take 1 tablet (20 mg total) by mouth daily. 12/06/15  Yes Evie Lacks Plotnikov, MD  furosemide (LASIX) 40 MG tablet Take 0.5 tablets (20 mg total) by mouth daily. 05/01/16  Yes Troy Sine, MD  isosorbide mononitrate (IMDUR) 60 MG 24 hr tablet Take 1 tablet (60 mg total) by mouth daily. 03/05/16  Yes Troy Sine, MD  KLOR-CON M20 20 MEQ tablet TAKE 1 TABLET EVERY DAY 03/06/16  Yes  Cassandria Anger, MD  Memantine HCl-Donepezil HCl (NAMZARIC) 28-10 MG CP24 Take 1 tablet by mouth daily. 06/03/16  Yes Evie Lacks Plotnikov, MD  metFORMIN (GLUCOPHAGE) 500 MG tablet TAKE 1 TABLET TWICE A DAY 03/06/16  Yes Evie Lacks Plotnikov, MD  omeprazole (PRILOSEC) 20 MG capsule Take 20 mg by mouth daily.   Yes Historical Provider, MD  sacubitril-valsartan (ENTRESTO) 49-51 MG Take 1 tablet by mouth 2 (two) times daily. 12/06/15  Yes Cassandria Anger, MD    Family History Family History  Problem Relation Age of Onset  . Hypertension Other   . CAD      father    Social History Social History  Substance Use Topics  . Smoking status: Never Smoker  . Smokeless tobacco: Never Used  .  Alcohol use No     Allergies   Clopidogrel bisulfate; Lipitor [atorvastatin]; and Lisinopril   Review of Systems Review of Systems  Constitutional: Positive for appetite change. Negative for chills and fever.  HENT: Negative for congestion.   Respiratory: Negative for cough and shortness of breath.   Cardiovascular: Negative for chest pain.  Gastrointestinal: Positive for diarrhea, nausea and vomiting. Negative for abdominal pain.  All other systems reviewed and are negative.  Physical Exam Updated Vital Signs BP (!) 130/51 (BP Location: Left Arm)   Pulse (!) 418   Temp 98.2 F (36.8 C) (Rectal)   Resp 18   Ht 5\' 5"  (1.651 m)   Wt 164 lb (74.4 kg)   SpO2 94%   BMI 27.29 kg/m   Physical Exam Physical Exam  Nursing note and vitals reviewed. Constitutional: Well developed, well nourished, non-toxic, and in no acute distress Head: Normocephalic and atraumatic.  Mouth/Throat: Oropharynx is clear and moist.  Neck: Normal range of motion. Neck supple.  Cardiovascular: Bradycardic rate and regular rhythm.   Pulmonary/Chest: Effort normal and breath sounds normal.  Abdominal: Soft. There is no tenderness. There is no rebound and no guarding.  Musculoskeletal: Normal range of motion.    Neurological: Alert, no facial droop, fluent speech, moves all extremities symmetrically Skin: Skin is warm and dry.  Psychiatric: Cooperative  ED Treatments / Results  Labs (all labs ordered are listed, but only abnormal results are displayed) Labs Reviewed  LIPASE, BLOOD - Abnormal; Notable for the following:       Result Value   Lipase 83 (*)    All other components within normal limits  COMPREHENSIVE METABOLIC PANEL - Abnormal; Notable for the following:    Glucose, Bld 220 (*)    BUN 50 (*)    Creatinine, Ser 2.25 (*)    GFR calc non Af Amer 18 (*)    GFR calc Af Amer 21 (*)    All other components within normal limits  URINALYSIS, ROUTINE W REFLEX MICROSCOPIC (NOT AT [facility]) - Abnormal; Notable for the following:    Leukocytes, UA SMALL (*)    All other components within normal limits  URINE MICROSCOPIC-ADD ON - Abnormal; Notable for the following:    Squamous Epithelial / LPF 0-5 (*)    Bacteria, UA MANY (*)    All other components within normal limits  C DIFFICILE QUICK SCREEN W PCR REFLEX  GASTROINTESTINAL PANEL BY PCR, STOOL (REPLACES STOOL CULTURE)  CBC  DIFFERENTIAL  I-STAT TROPOININ, ED    EKG  EKG Interpretation  Date/Time:  Wednesday June 19 2016 11:33:08 EDT Ventricular Rate:  50 PR Interval:    QRS Duration: 141 QT Interval:  437 QTC Calculation: 399 R Axis:   -39 Text Interpretation:  Sinus rhythm Prolonged PR interval Left bundle branch block No acute changes Confirmed by Claud Gowan MD, Niccole Witthuhn [id number]) on 06/19/2016 12:22:58 PM       Radiology Dg Chest 2 View  Result Date: 06/19/2016 CLINICAL DATA:  Nausea and vomiting, weakness, and altered mental status for several hours. EXAM: CHEST  2 VIEW COMPARISON:  03/22/2016 FINDINGS: The heart size and mediastinal contours are within normal limits. Aortic atherosclerosis. Both lungs are clear. The visualized skeletal structures are unremarkable. IMPRESSION: No active cardiopulmonary disease.  Aortic  atherosclerosis. Electronically Signed   By: Earle Gell M.D.   On: 06/19/2016 12:45   Procedures Procedures (including critical care time)  Medications Ordered in ED Medications  ondansetron (ZOFRAN) injection 4 mg (4  mg Intravenous Given 06/19/16 1309)  sodium chloride 0.9 % bolus 250 mL (0 mLs Intravenous Stopped 06/19/16 1514)    DIAGNOSTIC STUDIES:  Oxygen Saturation is 94% on RA, normal by my interpretation.    COORDINATION OF CARE:  12:25 PM Discussed treatment plan with pt at bedside and pt agreed to plan.  Initial Impression / Assessment and Plan / ED Course  I have reviewed the triage vital signs and the nursing notes.  Pertinent labs & imaging results that were available during my care of the patient were reviewed by me and considered in my medical decision making (see chart for details).  Clinical Course    82 year old female with history of dementia, CAD, DM who presents with nausea, vomiting, and diarrhea. She is at her baseline mental status on my evaluation per husband. Her vital signs are stable. She looks mildly dry. Has benign abdominal exam, unremarkable cardiopulmonary exam and grossly neuro in tact. Does have episode of diarrhea here in ED.   Given zofran, small bolus of IVF and able to tolerate fluids and looks and feels improved. Her blood work shows mild AKI, creatinine of 2.2. Baseline around 1.5, but earlier this year with creatine of 2.3 and 1.9. She has No major electrolyte or metabolic derangements. UA without infection chest x-ray visualized without acute cardiopulmonary processes. Troponin is negative and EKG nonischemic. I am not suspicious for atypical ACS presentation at this time. Presentation seems consistent with that of benign GI illness currently. She is feeling better and tolerating by mouth intake or think that she is stable for discharge home. Her husband states that she can have PCP follow-up in 2 days for recheck of her creatinine and basic  blood work. Strict return instructions are also reviewed. He expressed understanding of all discharge instructions, and felt comfortable with the plan of care.  I personally performed the services described in this documentation, which was scribed in my presence. The recorded information has been reviewed and is accurate.   Final Clinical Impressions(s) / ED Diagnoses   Final diagnoses:  Nausea vomiting and diarrhea    New Prescriptions New Prescriptions   No medications on file     Forde Dandy, MD 06/19/16 1531

## 2016-06-19 NOTE — ED Notes (Signed)
Pt given water to drink. Tolerating well at this time. 

## 2016-06-19 NOTE — Discharge Instructions (Signed)
Continue to drink plenty of fluids.  Please have primary care doctor follow up for recheck with repeat blood work in 2-3 days. Her creatine is a little elevated to 2.3 today.   Return for worsening symptoms, including confusion, intractable vomiting despite nausea medications, fever, loss of consciousness or any other symptoms concerning to you.

## 2016-06-19 NOTE — Patient Outreach (Signed)
[facility] [facility]) Care Management  [place]   06/19/2016  Arlyne Brandes [facility] Jan 31, 1926 [id number]  Subjective:  Home visit completed with patient on 06/19/16.  Patient verified HIPAA details and requested her spouse be present during home visit.    Patient reports she is taking her medications but reports she missed medications on Tuesday of this week.    Spouse reports he is continuing to fill patient's pill planner.  They report they have not filled the prescription for (memantine/donepezil) Namzaric which PCP provided them with in place of donepezil.  Spouse states family friend, [name], occasionally assists him with patient's pill box.    Spouse states patient has had decreased appetite the past couple of days.    Objective:   Encounter Medications: Outpatient Encounter Prescriptions as of 06/19/2016  Medication Sig  . amLODipine (NORVASC) 5 MG tablet Take 1 tablet (5 mg total) by mouth daily.  Marland Kitchen aspirin 81 MG EC tablet Take 81 mg by mouth daily.    . carvedilol (COREG) 12.5 MG tablet Take 0.5 tablets (6.25 mg total) by mouth 2 (two) times daily with a meal.  . citalopram (CELEXA) 20 MG tablet Take 1 tablet (20 mg total) by mouth daily.  . furosemide (LASIX) 40 MG tablet Take 0.5 tablets (20 mg total) by mouth daily.  . isosorbide mononitrate (IMDUR) 60 MG 24 hr tablet Take 1 tablet (60 mg total) by mouth daily.  Marland Kitchen KLOR-CON M20 20 MEQ tablet TAKE 1 TABLET EVERY DAY  . Memantine HCl-Donepezil HCl (NAMZARIC) 28-10 MG CP24 Take 1 tablet by mouth daily.  . metFORMIN (GLUCOPHAGE) 500 MG tablet TAKE 1 TABLET TWICE A DAY  . omeprazole (PRILOSEC) 20 MG capsule Take 20 mg by mouth daily.  . sacubitril-valsartan (ENTRESTO) 49-51 MG Take 1 tablet by mouth 2 (two) times daily.   No facility-administered encounter medications on file as of 06/19/2016.     Functional Status: In your present state of health, do you have any difficulty performing the following  activities: 01/29/2016 01/29/2016  Hearing? [name]  Vision? N N  Difficulty concentrating or making decisions? [name]  Walking or climbing stairs? Y Y  Dressing or bathing? N N  Doing errands, shopping? [name]  Preparing Food and eating ? N N  Using the Toilet? N N  In the past six months, have you accidently leaked urine? N N  Do you have problems with loss of bowel control? N N  Managing your Medications? Y Y  Managing your Finances? [name]  Housekeeping or managing your Housekeeping? [name]  Some recent data might be hidden    Fall/Depression Screening: PHQ 2/9 Scores 03/14/2016 02/12/2016 01/29/2016 12/28/2015 12/06/2015 10/28/2014  PHQ - 2 Score _0 0  PHQ- 9 Score _1 - 18 -    Assessment:  Medication management:  Pill planner was reviewed.  Patient's spouse had some days that contained donepezil still as well as some days which had a full tablet of digoxin.    Of note, digoxin, is no longer on patient's medication list.  Will send note to Dr Claiborne Billings, cardiologist, to clarify if patient is to be on this medication.   During home visit, patient began to dry heave and have decreased responsiveness.  She was not verbally communicating with spouse or [facility] Pharmacist.  Given decreased responsiveness, contacted [facility] via 911, who arrived at patient's home to assess her.  [facility] transported patient to  [facility] ED.     Monitoring:    Patient's SCr in ED on 06/19/16 had increased to 2.25 from 1.52 on 05/08/16.    Suggest close monitoring of patient's renal function.   Given reduced renal function, suggest consideration to holding metformin with eGFR<30.     Plan:  Will route note to PCP and Dr Claiborne Billings, cardiology.    Will follow-up with patient/spouse via phone in 1-2 weeks.   Karrie Meres, PharmD, [place] [phone]

## 2016-06-20 LAB — GASTROINTESTINAL PANEL BY PCR, STOOL (REPLACES STOOL CULTURE)
ADENOVIRUS F40/41: NOT DETECTED
Astrovirus: NOT DETECTED
CAMPYLOBACTER SPECIES: NOT DETECTED
CRYPTOSPORIDIUM: NOT DETECTED
Cyclospora cayetanensis: NOT DETECTED
ENTEROPATHOGENIC E COLI (EPEC): NOT DETECTED
Entamoeba histolytica: NOT DETECTED
Enteroaggregative E coli (EAEC): NOT DETECTED
Enterotoxigenic E coli (ETEC): NOT DETECTED
Giardia lamblia: NOT DETECTED
Norovirus GI/GII: NOT DETECTED
PLESIMONAS SHIGELLOIDES: NOT DETECTED
ROTAVIRUS A: NOT DETECTED
SALMONELLA SPECIES: NOT DETECTED
SHIGELLA/ENTEROINVASIVE E COLI (EIEC): NOT DETECTED
Sapovirus (I, II, IV, and V): NOT DETECTED
Shiga like toxin producing E coli (STEC): NOT DETECTED
Vibrio cholerae: NOT DETECTED
Vibrio species: NOT DETECTED
YERSINIA ENTEROCOLITICA: NOT DETECTED

## 2016-06-27 ENCOUNTER — Other Ambulatory Visit: Payer: Self-pay | Admitting: Pharmacist

## 2016-06-27 ENCOUNTER — Other Ambulatory Visit: Payer: Self-pay | Admitting: Internal Medicine

## 2016-06-27 NOTE — Patient Outreach (Signed)
[facility]) Care Management  06/27/2016  [place] 13-Jun-1926 [id number]  Follow-up phone call to patient following last week's home visit.  Patient states she prefers her spouse be spoken with but she states he isn't in the house and she doesn't know when he will be back.  She requests a call back next week.    Plan:  Will call patient/spouse back next week per patient request.   Karrie Meres, PharmD, [place] [phone]

## 2016-07-01 ENCOUNTER — Other Ambulatory Visit: Payer: Self-pay | Admitting: Pharmacist

## 2016-07-01 NOTE — Patient Outreach (Signed)
[place] [facility]) Care Management  07/01/2016  Mary Cook [facility] 1925-11-14 [id number]  Unsuccessful phone outreach to patient.  No answer, left HIPAA compliant voice message.    Given difficulty contacting patient or spouse who is on [facility] consent form via phone, placed a call to Mary Cook, friend, on [facility] Consent.  No answer, left a HIPAA compliant message.    Plan:  Will continue to try to reach patient or Mary Cook, on [facility] consent form to attempt to follow-up on patient's needs.    Karrie Meres, PharmD, [place] [phone]

## 2016-07-02 ENCOUNTER — Other Ambulatory Visit: Payer: Self-pay | Admitting: Pharmacist

## 2016-07-02 NOTE — Patient Outreach (Signed)
[facility] [facility]) Care Management  07/02/2016  Mary Cook [facility] 26-Aug-1926 [id number]  Received a voicemail from patient's spouse, on [facility] Consent form that he was taking patient to doctor appointment today.  Also received a voicemail from Mary Cook, on [facility] Consent form.     1347:  Placed return call to [name], on [facility] Consent form, she verified HIPAA details of patient.  She reports patient's spouse was taking patient to a doctor appointment today and requested [facility] Pharmacist call her back later this afternoon.   1554:  Placed a return call to patient/spouse---left a HIPAA compliant voicemail requesting a return call.   1650:  Placed a return call to [facility], per her request, and left a HIPAA complaint voicemail requesting a return call.   Plan:  Will continue to try to reach patient and or Mary Cook.   Karrie Meres, PharmD, [place] [phone]

## 2016-07-05 ENCOUNTER — Other Ambulatory Visit: Payer: Self-pay | Admitting: Pharmacist

## 2016-07-05 NOTE — Patient Outreach (Addendum)
[facility]) Care Management  07/05/2016  Angel[place] Aug [facility] 1926/02/07 [id number]  Attempted to follow-up with patient via phone as patient has been difficult to remain in contact with.  Patient answered phone, she states her spouse has been filling her pill box and she isn't sure if another home visit should be scheduled since her spouse fills the pill box and he wasn't available at time of call.     Asked patient to have spouse call [facility] Pharmacist back---they have [facility] Pharmacist phone number.    Placed call to [name], on [facility] consent form, no answer, left a HIPAA compliant voice message requesting a return call.    Plan:    Will attempt to reach patient/spouse or [name] again next week if no return call.   0930:  Received a voicemail back from patient's friend, [name], on [facility] consent form.  Returned call to [name], she verified HIPAA details of patient.   [name] states that she has not been filling patient's pill planner because patient's spouse has been resistant to her assisting with this task.  She states that she has slowed down going over to help patient because of resistance she has encountered from patient's spouse.  [name] states she tries to go to patient's MD appointments when she can.  She also states that patient and patient's spouse are at times difficult to reach via phone.   Plan:  [facility] pharmacist will continue to attempt to reach patient/spouse to potentially schedule a home visit to review pill planners as spouse has resisted other aids such as blister packaging of medications, having family friend fill pill boxes.    Will update [facility] LCSW Nicki Reaper F who is actively involved in patient's case.    Karrie Meres, PharmD, [place] [phone]

## 2016-07-08 ENCOUNTER — Other Ambulatory Visit: Payer: Self-pay | Admitting: Pharmacist

## 2016-07-08 NOTE — Patient Outreach (Signed)
[place] [facility]) Care Management  07/08/2016  Mary Cook [facility] Jan 04, 1926 [id number]  Unsuccessful attempt to reach patient.  Phone rang direct to voicemail.  HIPAA compliant message left.   Plan:  Will make another outreach attempt to patient/spouse, on [facility] Consent form, in the next week.   Karrie Meres, PharmD, [place] [phone]

## 2016-07-09 ENCOUNTER — Ambulatory Visit: Payer: Commercial Managed Care - HMO | Admitting: Pharmacist

## 2016-07-11 ENCOUNTER — Other Ambulatory Visit: Payer: Self-pay | Admitting: Pharmacist

## 2016-07-11 NOTE — Patient Outreach (Signed)
[place] [facility]) Care Management  07/11/2016  [place] 02/22/1926 [id number]  Unsuccessful phone outreach attempt.  HIPAA compliant voicemail left requesting return call.   Plan:  If no return call, will make a third phone outreach attempt next week.   Karrie Meres, PharmD, [place] [phone]

## 2016-07-15 ENCOUNTER — Other Ambulatory Visit: Payer: Self-pay | Admitting: Pharmacist

## 2016-07-15 NOTE — Patient Outreach (Signed)
[place] [facility]) Care Management  07/15/2016  Iliyana Sweeny [facility] [birth year] [id number]  Third unsuccessful outreach attempt to patient.  HIPAA compliant voicemail left requesting return call.   Plan:  If no return call, will consider sending patient an outreach letter.    Karrie Meres, PharmD, [place] [phone]

## 2016-07-19 ENCOUNTER — Other Ambulatory Visit: Payer: Self-pay | Admitting: Licensed Clinical Social Worker

## 2016-07-19 NOTE — Patient Outreach (Signed)
Assessment:  CSW spoke via phone with Mary Cook, friend of client.  Client has given permission for [facility] staff to communicate with Mary Cook. CSW verified identity of Mary Cook. Mary Cook and CSW spoke of client needs. Client has Alzheimer's disease. Client's spouse, Mary Cook, is helping client to take prescribed medications for client as scheduled. Mary Cook has reported that sometimes client and Mary Boll do not return calls or do not actively communicate with others very well. Mary Cook is trying to offer assistance to client and Mary Cook. She said that sometimes she is able to assist client and that sometimes client or Mary Cook refuse for her to help client.  Client has been receiving [place] with Mary Cook. Mary Bihari and CSW have discussed client needs numerous times. CSW has discussed with Mary Cook advanced directives options for client, skilled nursing facility placement for client, community resources for in home care for client and guardianship petition process and cost for client. CSW has also tried to discuss some of these issues with Mary Cook , spouse of client, but it is sometimes difficult to get Mary Cook to communicate with CSW about these issues.  Mary Cook, [facility] pharmacist, plans to discuss client needs at Difficult Case Discussion meeting on July 25, 2016.  Mary Cook reported that client has medical appointemnt with Dr. Alain Marion next Monday.  Mary Cook reported that client also sees Dr. Claiborne Billings, cardiologist, as scheduled.  Mary Cook said that client and Mary Cook go often into the community to shop for groceries or look at stores. Mary Cook said Mary Cook loves to ride and he and client ride to various locations of interest in the community often. Mary Cook said that Mary Cook had told her he wanted client to stay at home and not go to a nursing facility. Mary Cook said Mary Cook, spouse of client, has a fairly new car to drive. Mary Cook said she helps client or Mary Cook with financial issues or to advise when paying client bills.  Client  is eating well.  CSW and Mary Cook spoke of client care plan. CSW encouraged that client attend all scheduled client medical appointments in next 30 days.CSW also encouraged Mary Cook to call CSW at [phone] as needed to discuss social work needs of client.    Plan:  Client to attend all scheduled client medical appointments in next 30 days  CSW to collaborate with Mary Cook, [facility] pharmacist, in monitoring needs of client.  CSW to call client or Mary Cook in 4 weeks to assess client needs.   Norva Riffle.Sharrieff Spratlin MSW, LCSW Licensed Clinical Social Worker [facility] Care Management [phone]

## 2016-07-22 ENCOUNTER — Ambulatory Visit (INDEPENDENT_AMBULATORY_CARE_PROVIDER_SITE_OTHER): Payer: Commercial Managed Care - HMO | Admitting: Internal Medicine

## 2016-07-22 ENCOUNTER — Encounter: Payer: Self-pay | Admitting: Internal Medicine

## 2016-07-22 DIAGNOSIS — G301 Alzheimer's disease with late onset: Secondary | ICD-10-CM

## 2016-07-22 DIAGNOSIS — K529 Noninfective gastroenteritis and colitis, unspecified: Secondary | ICD-10-CM | POA: Diagnosis not present

## 2016-07-22 DIAGNOSIS — I10 Essential (primary) hypertension: Secondary | ICD-10-CM | POA: Diagnosis not present

## 2016-07-22 DIAGNOSIS — F02818 Dementia in other diseases classified elsewhere, unspecified severity, with other behavioral disturbance: Secondary | ICD-10-CM

## 2016-07-22 DIAGNOSIS — E538 Deficiency of other specified B group vitamins: Secondary | ICD-10-CM | POA: Diagnosis not present

## 2016-07-22 DIAGNOSIS — N183 Chronic kidney disease, stage 3 unspecified: Secondary | ICD-10-CM

## 2016-07-22 DIAGNOSIS — I5043 Acute on chronic combined systolic (congestive) and diastolic (congestive) heart failure: Secondary | ICD-10-CM

## 2016-07-22 DIAGNOSIS — F0281 Dementia in other diseases classified elsewhere with behavioral disturbance: Secondary | ICD-10-CM

## 2016-07-22 MED ORDER — METFORMIN HCL 500 MG PO TABS: 500.0000 mg | ORAL_TABLET | Freq: Every day | ORAL | 2 refills | Status: DC

## 2016-07-22 NOTE — Assessment & Plan Note (Signed)
Reduce lasix  Monitor labs

## 2016-07-22 NOTE — Assessment & Plan Note (Signed)
Resolved w/IVF and Zofran Labs ok

## 2016-07-22 NOTE — Assessment & Plan Note (Signed)
On [name] 

## 2016-07-22 NOTE — Progress Notes (Signed)
Pre visit review using our clinic review tool, if applicable. No additional management support is needed unless otherwise documented below in the visit note. 

## 2016-07-22 NOTE — Patient Instructions (Signed)
Take Furosemide 1/2 tab a day Take Metformin 1 a day in am only

## 2016-07-22 NOTE — Progress Notes (Signed)
Subjective:  Patient ID: Mary Cook, female    DOB: [birth year]  Age: 82 y.o. MRN: [id number]  CC: No chief complaint on file.   HPI Mary Cook presents for n/v/d sx's on 06/19/16 treated in ER with IVF and meds - no relapse since d/c. C diff (-)  It started after she took a Namzaric pill... F/u CAD, CHF   "82 year old female with history of dementia, CAD, DM who presents with nausea, vomiting, and diarrhea. She is at her baseline mental status on my evaluation per husband. Her vital signs are stable. She looks mildly dry. Has benign abdominal exam, unremarkable cardiopulmonary exam and grossly neuro in tact. Does have episode of diarrhea here in ED.   Given zofran, small bolus of IVF and able to tolerate fluids and looks and feels improved. Her blood work shows mild AKI, creatinine of 2.2. Baseline around 1.5, but earlier this year with creatine of 2.3 and 1.9. She has No major electrolyte or metabolic derangements. UA without infection chest x-ray visualized without acute cardiopulmonary processes. Troponin is negative and EKG nonischemic. I am not suspicious for atypical ACS presentation at this time. Presentation seems consistent with that of benign GI illness currently. She is feeling better and tolerating by mouth intake or think that she is stable for discharge home. Her husband states that she can have PCP follow-up in 2 days for recheck of her creatinine and basic blood work. Strict return instructions are also reviewed. He expressed understanding of all discharge instructions, and felt comfortable with the plan of care."   Outpatient Medications Prior to Visit  Medication Sig Dispense Refill  . amLODipine (NORVASC) 5 MG tablet Take 1 tablet (5 mg total) by mouth daily. 30 tablet 1  . aspirin 81 MG EC tablet Take 81 mg by mouth daily.      . carvedilol (COREG) 12.5 MG tablet Take 0.5 tablets (6.25 mg total) by mouth 2 (two) times daily with a meal. 90 tablet 3  .  citalopram (CELEXA) 20 MG tablet Take 1 tablet (20 mg total) by mouth daily. 90 tablet 2  . furosemide (LASIX) 40 MG tablet Take 0.5 tablets (20 mg total) by mouth daily. 90 tablet 3  . isosorbide mononitrate (IMDUR) 60 MG 24 hr tablet Take 1 tablet (60 mg total) by mouth daily. 90 tablet 3  . KLOR-CON M20 20 MEQ tablet TAKE 1 TABLET EVERY DAY 90 tablet 2  . metFORMIN (GLUCOPHAGE) 500 MG tablet TAKE 1 TABLET TWICE A DAY 180 tablet 2  . omeprazole (PRILOSEC) 20 MG capsule Take 20 mg by mouth daily.    . sacubitril-valsartan (ENTRESTO) 49-51 MG Take 1 tablet by mouth 2 (two) times daily. 180 tablet 3  . Memantine HCl-Donepezil HCl (NAMZARIC) 28-10 MG CP24 Take 1 tablet by mouth daily. (Patient not taking: Reported on 07/22/2016) 30 capsule 11   No facility-administered medications prior to visit.     ROS Review of Systems  Constitutional: Positive for fatigue. Negative for activity change, appetite change, chills and unexpected weight change.  HENT: Negative for congestion, mouth sores and sinus pressure.   Eyes: Negative for visual disturbance.  Respiratory: Positive for shortness of breath. Negative for cough and chest tightness.   Gastrointestinal: Negative for abdominal pain and nausea.  Genitourinary: Negative for difficulty urinating, frequency and vaginal pain.  Musculoskeletal: Positive for back pain and gait problem.  Skin: Negative for pallor and rash.  Neurological: Positive for weakness and light-headedness. Negative for dizziness,  tremors, numbness and headaches.  Psychiatric/Behavioral: Positive for confusion and decreased concentration. Negative for sleep disturbance and suicidal ideas.    Objective:  BP (!) 116/56   Pulse (!) 52   Temp 98.4 F (36.9 C) (Oral)   Resp 12   Ht 5\' 5"  (1.651 m)   Wt 153 lb (69.4 kg)   SpO2 94%   BMI 25.46 kg/m   BP Readings from Last 3 Encounters:  07/22/16 (!) 116/56  06/19/16 120/56  06/04/16 (!) 120/58    Wt Readings from Last  3 Encounters:  07/22/16 153 lb (69.4 kg)  06/19/16 164 lb (74.4 kg)  06/04/16 163 lb 9.6 oz (74.2 kg)    Physical Exam  Constitutional: She appears well-developed. No distress.  HENT:  Head: Normocephalic.  Right Ear: External ear normal.  Left Ear: External ear normal.  Nose: Nose normal.  Mouth/Throat: Oropharynx is clear and moist.  Eyes: Conjunctivae are normal. Pupils are equal, round, and reactive to light. Right eye exhibits no discharge. Left eye exhibits no discharge.  Neck: Normal range of motion. Neck supple. No JVD present. No tracheal deviation present. No thyromegaly present.  Cardiovascular: Normal rate, regular rhythm and normal heart sounds.   Pulmonary/Chest: No stridor. No respiratory distress. She has no wheezes.  Abdominal: Soft. Bowel sounds are normal. She exhibits no distension and no mass. There is no tenderness. There is no rebound and no guarding.  Musculoskeletal: She exhibits no edema or tenderness.  Lymphadenopathy:    She has no cervical adenopathy.  Neurological: She displays normal reflexes. No cranial nerve deficit. She exhibits normal muscle tone. Coordination abnormal.  Skin: No rash noted. No erythema.  Psychiatric: She has a normal mood and affect. Her behavior is normal. Judgment and thought content normal.  cane disoriented  Lab Results  Component Value Date   WBC 8.4 06/19/2016   HGB 12.4 06/19/2016   HCT 38.1 06/19/2016   PLT 202 06/19/2016   GLUCOSE 220 (H) 06/19/2016   CHOL 230 (H) 11/16/2015   TRIG 97 11/16/2015   HDL 68 11/16/2015   LDLDIRECT 180.7 12/09/2011   LDLCALC 143 (H) 11/16/2015   ALT 26 06/19/2016   AST 25 06/19/2016   NA 136 06/19/2016   K 4.5 06/19/2016   CL 102 06/19/2016   CREATININE 2.25 (H) 06/19/2016   BUN 50 (H) 06/19/2016   CO2 26 06/19/2016   TSH 1.56 10/12/2014   INR 1.01 11/15/2015   HGBA1C 7.3 (H) 03/15/2016    Dg Chest 2 View  Result Date: 06/19/2016 CLINICAL DATA:  Nausea and vomiting,  weakness, and altered mental status for several hours. EXAM: CHEST  2 VIEW COMPARISON:  03/22/2016 FINDINGS: The heart size and mediastinal contours are within normal limits. Aortic atherosclerosis. Both lungs are clear. The visualized skeletal structures are unremarkable. IMPRESSION: No active cardiopulmonary disease.  Aortic atherosclerosis. Electronically Signed   By: Earle Gell M.D.   On: 06/19/2016 12:45    Assessment & Plan:   There are no diagnoses linked to this encounter. I am having Ms. Uselman maintain her aspirin, omeprazole, citalopram, sacubitril-valsartan, carvedilol, isosorbide mononitrate, KLOR-CON M20, metFORMIN, furosemide, amLODipine, and Memantine HCl-Donepezil HCl.  No orders of the defined types were placed in this encounter.    Follow-up: No Follow-up on file.  Walker Kehr, MD

## 2016-07-22 NOTE — Assessment & Plan Note (Signed)
Meds reduced Labs in 4 wks

## 2016-07-22 NOTE — Assessment & Plan Note (Signed)
Resume Coreg, Digoxin, Isosorbide,Lasix - try 1/2 tab, Delene Loll

## 2016-07-22 NOTE — Assessment & Plan Note (Addendum)
had a reaction to Namzaric d/c

## 2016-07-26 ENCOUNTER — Encounter: Payer: Self-pay | Admitting: Pharmacist

## 2016-08-08 ENCOUNTER — Other Ambulatory Visit: Payer: Self-pay | Admitting: Licensed Clinical Social Worker

## 2016-08-08 ENCOUNTER — Encounter: Payer: Self-pay | Admitting: Pharmacist

## 2016-08-08 ENCOUNTER — Encounter: Payer: Self-pay | Admitting: Licensed Clinical Social Worker

## 2016-08-08 NOTE — Patient Outreach (Signed)
Assessment:  CSW has been unable to maintain communication with client or with spouse of client.  [facility] pharmacist, Isabell Jarvis, has also provided [facility] pharmacy services for client for a number of months. Isabell Jarvis, [facility] pharmacist, mailed letter to client 10 days ago regarding inability of [facility] staff to communicate with client or spouse.  Client or spouse of client have not communicated with Isabell Jarvis, [facility] pharmacist ,or with CSW Theadore Nan in past 10 days as requested in letter mailed to client by Lennette Bihari Reudinger. Thus, CSW is discharging [employer]. Ambrosino from [place] services on 08/08/16.  Lennette Bihari Reudinger is aware of CSW plan to discharge client from [facility] CSW services on 08/08/16.   Plan:  CSW is discharging [place]. Hauser from [place] on 08/08/16 due to client's non communication with [facility] CSW.  CSW to inform Josepha Pigg that [place] discharged client from [facility] CSW services on 08/08/16.  CSW to fax Dr Alain Marion a letter informing Dr. Alain Marion that [place] discharged client from [facility] CSW services on 08/08/16.   Norva Riffle.Oksana Deberry MSW, LCSW Licensed Clinical Social Worker [facility] Care Management [phone]

## 2016-08-08 NOTE — Patient Outreach (Deleted)
[facility]) Care Management  08/08/2016  Mary Cook [facility] 06/11/1926 [id number]  [place] Pharmacist made 3 unsuccessful phone outreach attempts to patient with no return call from patient.  An outreach letter was mailed and no response received from patient.   Plan:  Will close case due to unable to maintain contact with patient.   Will route case closure letter to PCP.   Karrie Meres, PharmD, [place] [phone]

## 2016-08-09 ENCOUNTER — Other Ambulatory Visit: Payer: Self-pay | Admitting: Pharmacist

## 2016-08-09 NOTE — Patient Outreach (Signed)
[facility]) Care Management  08/09/2016  Rakira Bentham [facility] Mar 30, 1926 [id number]  Late entry for 08/08/16.    [facility] Pharmacist made 3 unsuccessful phone outreach attempts to patient with no return call from patient.  An outreach letter was mailed and no response received from patient.   Plan:  Will close case due to unable to maintain contact with patient.   Will route case closure letter to PCP.   Karrie Meres, PharmD, [place] [phone]

## 2016-08-13 DIAGNOSIS — I509 Heart failure, unspecified: Secondary | ICD-10-CM | POA: Diagnosis not present

## 2016-08-13 DIAGNOSIS — Z79899 Other long term (current) drug therapy: Secondary | ICD-10-CM | POA: Diagnosis not present

## 2016-08-13 DIAGNOSIS — I10 Essential (primary) hypertension: Secondary | ICD-10-CM | POA: Diagnosis not present

## 2016-08-13 DIAGNOSIS — I255 Ischemic cardiomyopathy: Secondary | ICD-10-CM | POA: Diagnosis not present

## 2016-08-13 LAB — CBC
HEMATOCRIT: 30.5 % — AB (ref 35.0–45.0)
HEMOGLOBIN: 10.1 g/dL — AB (ref 11.7–15.5)
MCH: 30.3 pg (ref 27.0–33.0)
MCHC: 33.1 g/dL (ref 32.0–36.0)
MCV: 91.6 fL (ref 80.0–100.0)
MPV: 9.9 fL (ref 7.5–12.5)
Platelets: 192 10*3/uL (ref 140–400)
RBC: 3.33 MIL/uL — AB (ref 3.80–5.10)
RDW: 14.8 % (ref 11.0–15.0)
WBC: 6.1 10*3/uL (ref 3.8–10.8)

## 2016-08-14 LAB — COMPREHENSIVE METABOLIC PANEL
ALBUMIN: 3.4 g/dL — AB (ref 3.6–5.1)
ALT: 7 U/L (ref 6–29)
AST: 10 U/L (ref 10–35)
Alkaline Phosphatase: 66 U/L (ref 33–130)
BUN: 18 mg/dL (ref 7–25)
CHLORIDE: 101 mmol/L (ref 98–110)
CO2: 29 mmol/L (ref 20–31)
CREATININE: 1.55 mg/dL — AB (ref 0.60–0.88)
Calcium: 9 mg/dL (ref 8.6–10.4)
Glucose, Bld: 134 mg/dL — ABNORMAL HIGH (ref 65–99)
Potassium: 4.4 mmol/L (ref 3.5–5.3)
SODIUM: 139 mmol/L (ref 135–146)
TOTAL PROTEIN: 6.4 g/dL (ref 6.1–8.1)
Total Bilirubin: 0.5 mg/dL (ref 0.2–1.2)

## 2016-08-14 LAB — TSH: TSH: 1.68 m[IU]/L

## 2016-08-14 LAB — BRAIN NATRIURETIC PEPTIDE: BRAIN NATRIURETIC PEPTIDE: 174.8 pg/mL — AB (ref <100)

## 2016-08-16 ENCOUNTER — Encounter: Payer: Self-pay | Admitting: Internal Medicine

## 2016-08-16 ENCOUNTER — Ambulatory Visit (INDEPENDENT_AMBULATORY_CARE_PROVIDER_SITE_OTHER): Payer: Commercial Managed Care - HMO | Admitting: Internal Medicine

## 2016-08-16 ENCOUNTER — Ambulatory Visit: Payer: Self-pay | Admitting: Licensed Clinical Social Worker

## 2016-08-16 DIAGNOSIS — F0281 Dementia in other diseases classified elsewhere with behavioral disturbance: Secondary | ICD-10-CM

## 2016-08-16 DIAGNOSIS — K219 Gastro-esophageal reflux disease without esophagitis: Secondary | ICD-10-CM

## 2016-08-16 DIAGNOSIS — E1159 Type 2 diabetes mellitus with other circulatory complications: Secondary | ICD-10-CM

## 2016-08-16 DIAGNOSIS — I255 Ischemic cardiomyopathy: Secondary | ICD-10-CM | POA: Diagnosis not present

## 2016-08-16 DIAGNOSIS — I5043 Acute on chronic combined systolic (congestive) and diastolic (congestive) heart failure: Secondary | ICD-10-CM | POA: Diagnosis not present

## 2016-08-16 DIAGNOSIS — G301 Alzheimer's disease with late onset: Secondary | ICD-10-CM

## 2016-08-16 DIAGNOSIS — F02818 Dementia in other diseases classified elsewhere, unspecified severity, with other behavioral disturbance: Secondary | ICD-10-CM

## 2016-08-16 MED ORDER — B COMPLEX PO TABS: 1.0000 | ORAL_TABLET | Freq: Every day | ORAL | 3 refills | Status: DC

## 2016-08-16 MED ORDER — DONEPEZIL HCL 5 MG PO TABS: 5.0000 mg | ORAL_TABLET | Freq: Every day | ORAL | 11 refills | Status: DC

## 2016-08-16 MED ORDER — CHOLECALCIFEROL 50 MCG (2000 UT) PO CAPS: ORAL_CAPSULE | ORAL | 3 refills | Status: DC

## 2016-08-16 NOTE — Assessment & Plan Note (Signed)
On Coreg, Digoxin, Isosorbide, Lasix, Entresto Risks associated with treatment noncompliance were discussed. Compliance was encouraged.

## 2016-08-16 NOTE — Assessment & Plan Note (Signed)
Doing well 

## 2016-08-16 NOTE — Assessment & Plan Note (Signed)
On Coreg, Digoxin, Isosorbide, Lasix, Mary Cook

## 2016-08-16 NOTE — Progress Notes (Signed)
Pre visit review using our clinic review tool, if applicable. No additional management support is needed unless otherwise documented below in the visit note. 

## 2016-08-16 NOTE — Progress Notes (Signed)
Subjective:  Patient ID: Mary Cook, female    DOB: 10/06/1925  Age: 82 y.o. MRN: [id number]  CC: No chief complaint on file.   HPI [employer] presents for fatigue, memory loss, CHF f/u  Outpatient Medications Prior to Visit  Medication Sig Dispense Refill  . aspirin 81 MG EC tablet Take 81 mg by mouth daily.      . carvedilol (COREG) 12.5 MG tablet Take 0.5 tablets (6.25 mg total) by mouth 2 (two) times daily with a meal. 90 tablet 3  . citalopram (CELEXA) 20 MG tablet Take 1 tablet (20 mg total) by mouth daily. 90 tablet 2  . furosemide (LASIX) 40 MG tablet Take 0.5 tablets (20 mg total) by mouth daily. 90 tablet 3  . isosorbide mononitrate (IMDUR) 60 MG 24 hr tablet Take 1 tablet (60 mg total) by mouth daily. 90 tablet 3  . KLOR-CON M20 20 MEQ tablet TAKE 1 TABLET EVERY DAY 90 tablet 2  . metFORMIN (GLUCOPHAGE) 500 MG tablet Take 1 tablet (500 mg total) by mouth daily with breakfast. 90 tablet 2  . omeprazole (PRILOSEC) 20 MG capsule Take 20 mg by mouth daily.    . sacubitril-valsartan (ENTRESTO) 49-51 MG Take 1 tablet by mouth 2 (two) times daily. 180 tablet 3  . amLODipine (NORVASC) 5 MG tablet Take 1 tablet (5 mg total) by mouth daily. 30 tablet 1   No facility-administered medications prior to visit.     ROS Review of Systems  Constitutional: Negative for activity change, appetite change, chills, fatigue and unexpected weight change.  HENT: Negative for congestion, mouth sores and sinus pressure.   Eyes: Negative for visual disturbance.  Respiratory: Negative for cough and chest tightness.   Gastrointestinal: Negative for abdominal pain and nausea.  Genitourinary: Negative for difficulty urinating, frequency and vaginal pain.  Musculoskeletal: Positive for gait problem. Negative for back pain.  Skin: Negative for pallor and rash.  Neurological: Positive for dizziness, weakness and light-headedness. Negative for tremors, numbness and headaches.    Psychiatric/Behavioral: Positive for decreased concentration. Negative for confusion and sleep disturbance. The patient is nervous/anxious.     Objective:  BP (!) 160/74   Pulse (!) 59   Wt 161 lb (73 kg)   SpO2 91%   BMI 26.79 kg/m   BP Readings from Last 3 Encounters:  08/16/16 (!) 160/74  07/22/16 (!) 116/56  06/19/16 120/56    Wt Readings from Last 3 Encounters:  08/16/16 161 lb (73 kg)  07/22/16 153 lb (69.4 kg)  06/19/16 164 lb (74.4 kg)    Physical Exam  Constitutional: She appears well-developed. No distress.  HENT:  Head: Normocephalic.  Right Ear: External ear normal.  Left Ear: External ear normal.  Nose: Nose normal.  Mouth/Throat: Oropharynx is clear and moist.  Eyes: Conjunctivae are normal. Pupils are equal, round, and reactive to light. Right eye exhibits no discharge. Left eye exhibits no discharge.  Neck: Normal range of motion. Neck supple. No JVD present. No tracheal deviation present. No thyromegaly present.  Cardiovascular: Normal rate, regular rhythm and normal heart sounds.   Pulmonary/Chest: No stridor. No respiratory distress. She has no wheezes.  Abdominal: Soft. Bowel sounds are normal. She exhibits no distension and no mass. There is no tenderness. There is no rebound and no guarding.  Musculoskeletal: She exhibits no edema or tenderness.  Lymphadenopathy:    She has no cervical adenopathy.  Neurological: She displays normal reflexes. No cranial nerve deficit. She exhibits normal muscle  tone. Coordination abnormal.  Skin: No rash noted. No erythema.  Psychiatric: She has a normal mood and affect. Her behavior is normal.  obese Disoriented   Lab Results  Component Value Date   WBC 6.1 08/13/2016   HGB 10.1 (L) 08/13/2016   HCT 30.5 (L) 08/13/2016   PLT 192 08/13/2016   GLUCOSE 134 (H) 08/13/2016   CHOL 230 (H) 11/16/2015   TRIG 97 11/16/2015   HDL 68 11/16/2015   LDLDIRECT 180.7 12/09/2011   LDLCALC 143 (H) 11/16/2015   ALT 7  08/13/2016   AST 10 08/13/2016   NA 139 08/13/2016   K 4.4 08/13/2016   CL 101 08/13/2016   CREATININE 1.55 (H) 08/13/2016   BUN 18 08/13/2016   CO2 29 08/13/2016   TSH 1.68 08/13/2016   INR 1.01 11/15/2015   HGBA1C 7.3 (H) 03/15/2016    Dg Chest 2 View  Result Date: 06/19/2016 CLINICAL DATA:  Nausea and vomiting, weakness, and altered mental status for several hours. EXAM: CHEST  2 VIEW COMPARISON:  03/22/2016 FINDINGS: The heart size and mediastinal contours are within normal limits. Aortic atherosclerosis. Both lungs are clear. The visualized skeletal structures are unremarkable. IMPRESSION: No active cardiopulmonary disease.  Aortic atherosclerosis. Electronically Signed   By: Earle Gell M.D.   On: 06/19/2016 12:45    Assessment & Plan:   There are no diagnoses linked to this encounter. I am having Ms. Sawyer maintain her aspirin, omeprazole, citalopram, sacubitril-valsartan, carvedilol, isosorbide mononitrate, KLOR-CON M20, furosemide, amLODipine, metFORMIN, and donepezil.  Meds ordered this encounter  Medications  . donepezil (ARICEPT) 5 MG tablet    Sig: Take 1 tablet by mouth daily.     Follow-up: No Follow-up on file.  Walker Kehr, MD

## 2016-08-16 NOTE — Assessment & Plan Note (Signed)
metformin

## 2016-08-16 NOTE — Assessment & Plan Note (Addendum)
Worse Restart Aricept, Vit B complex and Vit D

## 2016-08-30 ENCOUNTER — Telehealth: Payer: Self-pay | Admitting: *Deleted

## 2016-08-30 NOTE — Telephone Encounter (Signed)
-----   Message from Troy Sine, MD sent at 08/28/2016  9:33 AM EST ----- Renal fxn better; anemia is more pronounced; check stool guiacs, f/u with primary

## 2016-08-30 NOTE — Telephone Encounter (Signed)
Spoke with Mary Cook informing her of lab results and recommendations. Staff message also sent to Dr Alain Marion to review labs and call patient with recommendations. Also advised [name] to contact his office for appointment. She voiced verbal understanding.

## 2016-09-02 ENCOUNTER — Ambulatory Visit: Payer: Commercial Managed Care - HMO | Admitting: Internal Medicine

## 2016-09-04 ENCOUNTER — Ambulatory Visit: Payer: Commercial Managed Care - HMO | Admitting: Cardiovascular Disease

## 2016-09-06 ENCOUNTER — Encounter: Payer: Self-pay | Admitting: *Deleted

## 2016-09-06 ENCOUNTER — Other Ambulatory Visit (INDEPENDENT_AMBULATORY_CARE_PROVIDER_SITE_OTHER): Payer: Commercial Managed Care - HMO

## 2016-09-06 ENCOUNTER — Ambulatory Visit (INDEPENDENT_AMBULATORY_CARE_PROVIDER_SITE_OTHER): Payer: Commercial Managed Care - HMO | Admitting: Internal Medicine

## 2016-09-06 ENCOUNTER — Encounter: Payer: Self-pay | Admitting: Internal Medicine

## 2016-09-06 DIAGNOSIS — D638 Anemia in other chronic diseases classified elsewhere: Secondary | ICD-10-CM

## 2016-09-06 DIAGNOSIS — N183 Chronic kidney disease, stage 3 unspecified: Secondary | ICD-10-CM

## 2016-09-06 DIAGNOSIS — E538 Deficiency of other specified B group vitamins: Secondary | ICD-10-CM | POA: Diagnosis not present

## 2016-09-06 DIAGNOSIS — I5022 Chronic systolic (congestive) heart failure: Secondary | ICD-10-CM

## 2016-09-06 DIAGNOSIS — I5043 Acute on chronic combined systolic (congestive) and diastolic (congestive) heart failure: Secondary | ICD-10-CM

## 2016-09-06 DIAGNOSIS — I255 Ischemic cardiomyopathy: Secondary | ICD-10-CM | POA: Diagnosis not present

## 2016-09-06 DIAGNOSIS — I10 Essential (primary) hypertension: Secondary | ICD-10-CM

## 2016-09-06 LAB — BASIC METABOLIC PANEL
BUN: 14 mg/dL (ref 6–23)
CO2: 28 mEq/L (ref 19–32)
Calcium: 9.5 mg/dL (ref 8.4–10.5)
Chloride: 103 mEq/L (ref 96–112)
Creatinine, Ser: 1.3 mg/dL — ABNORMAL HIGH (ref 0.40–1.20)
GFR: 41.05 mL/min — ABNORMAL LOW (ref >60.00)
Glucose, Bld: 124 mg/dL — ABNORMAL HIGH (ref 70–99)
POTASSIUM: 4.2 meq/L (ref 3.5–5.1)
SODIUM: 137 meq/L (ref 135–145)

## 2016-09-06 LAB — CBC WITH DIFFERENTIAL/PLATELET
Basophils Absolute: 0 10*3/uL (ref 0.0–0.1)
Basophils Relative: 0.8 % (ref 0.0–3.0)
EOS ABS: 0.2 10*3/uL (ref 0.0–0.7)
Eosinophils Relative: 2.7 % (ref 0.0–5.0)
HCT: 31.9 % — ABNORMAL LOW (ref 36.0–46.0)
HEMOGLOBIN: 10.7 g/dL — AB (ref 12.0–15.0)
Lymphocytes Relative: 29.3 % (ref 12.0–46.0)
Lymphs Abs: 1.9 10*3/uL (ref 0.7–4.0)
MCHC: 33.4 g/dL (ref 30.0–36.0)
MCV: 93 fl (ref 78.0–100.0)
MONO ABS: 0.5 10*3/uL (ref 0.1–1.0)
Monocytes Relative: 8.1 % (ref 3.0–12.0)
NEUTROS ABS: 3.9 10*3/uL (ref 1.4–7.7)
Neutrophils Relative %: 59.1 % (ref 43.0–77.0)
PLATELETS: 224 10*3/uL (ref 150.0–400.0)
RBC: 3.44 Mil/uL — ABNORMAL LOW (ref 3.87–5.11)
RDW: 14.1 % (ref 11.5–15.5)
WBC: 6.6 10*3/uL (ref 4.0–10.5)

## 2016-09-06 MED ORDER — CARVEDILOL 12.5 MG PO TABS: 12.5000 mg | ORAL_TABLET | Freq: Two times a day (BID) | ORAL | 3 refills | Status: DC

## 2016-09-06 MED ORDER — CYANOCOBALAMIN 1000 MCG/ML IJ SOLN
1000.0000 ug | Freq: Once | INTRAMUSCULAR | Status: AC
  Administered 2016-09-06: 1000 ug via INTRAMUSCULAR

## 2016-09-06 NOTE — Assessment & Plan Note (Signed)
CRF, CHF CBC, other labs

## 2016-09-06 NOTE — Assessment & Plan Note (Signed)
Mr Verrastro is c/o pt's noncompliance w/meds Entresto, Lasix, Coreg

## 2016-09-06 NOTE — Assessment & Plan Note (Signed)
On [name] Mr Manella is c/o pt's noncompliance w/meds

## 2016-09-06 NOTE — Assessment & Plan Note (Signed)
Labs

## 2016-09-06 NOTE — Addendum Note (Signed)
Addended by: Cresenciano Lick on: 09/06/2016 04:29 PM   Modules accepted: Orders

## 2016-09-06 NOTE — Progress Notes (Signed)
Subjective:  Patient ID: Mary Cook, female    DOB: 12-06-1925  Age: 83 y.o. MRN: [id number]  CC: No chief complaint on file.   HPI [employer] presents for CHF, dementia, CAD f/u Mr Abresch is c/o her noncompliance w/meds  Outpatient Medications Prior to Visit  Medication Sig Dispense Refill  . aspirin 81 MG EC tablet Take 81 mg by mouth daily.      Marland Kitchen b complex vitamins tablet Take 1 tablet by mouth daily. 100 tablet 3  . carvedilol (COREG) 12.5 MG tablet Take 0.5 tablets (6.25 mg total) by mouth 2 (two) times daily with a meal. 90 tablet 3  . Cholecalciferol 2000 units CAPS 1 po qd 100 each 3  . citalopram (CELEXA) 20 MG tablet Take 1 tablet (20 mg total) by mouth daily. 90 tablet 2  . donepezil (ARICEPT) 5 MG tablet Take 1 tablet (5 mg total) by mouth daily. 90 tablet 11  . furosemide (LASIX) 40 MG tablet Take 0.5 tablets (20 mg total) by mouth daily. 90 tablet 3  . isosorbide mononitrate (IMDUR) 60 MG 24 hr tablet Take 1 tablet (60 mg total) by mouth daily. 90 tablet 3  . KLOR-CON M20 20 MEQ tablet TAKE 1 TABLET EVERY DAY 90 tablet 2  . metFORMIN (GLUCOPHAGE) 500 MG tablet Take 1 tablet (500 mg total) by mouth daily with breakfast. 90 tablet 2  . omeprazole (PRILOSEC) 20 MG capsule Take 20 mg by mouth daily.    . sacubitril-valsartan (ENTRESTO) 49-51 MG Take 1 tablet by mouth 2 (two) times daily. 180 tablet 3  . amLODipine (NORVASC) 5 MG tablet Take 1 tablet (5 mg total) by mouth daily. 30 tablet 1   No facility-administered medications prior to visit.     ROS Review of Systems  Constitutional: Negative for activity change, appetite change, chills, fatigue and unexpected weight change.  HENT: Negative for congestion, mouth sores and sinus pressure.   Eyes: Negative for visual disturbance.  Respiratory: Negative for cough and chest tightness.   Gastrointestinal: Negative for abdominal pain and nausea.  Genitourinary: Negative for difficulty urinating,  frequency and vaginal pain.  Musculoskeletal: Positive for gait problem. Negative for back pain.  Skin: Negative for pallor and rash.  Neurological: Negative for dizziness, tremors, weakness, numbness and headaches.  Psychiatric/Behavioral: Positive for behavioral problems, confusion and decreased concentration. Negative for agitation, sleep disturbance and suicidal ideas. The patient is nervous/anxious.     Objective:  BP (!) 180/82   Pulse 75   Wt 157 lb (71.2 kg)   SpO2 96%   BMI 26.13 kg/m   BP Readings from Last 3 Encounters:  09/06/16 (!) 180/82  08/16/16 (!) 160/74  07/22/16 (!) 116/56    Wt Readings from Last 3 Encounters:  09/06/16 157 lb (71.2 kg)  08/16/16 161 lb (73 kg)  07/22/16 153 lb (69.4 kg)    Physical Exam  Constitutional: She appears well-developed. No distress.  HENT:  Head: Normocephalic.  Right Ear: External ear normal.  Left Ear: External ear normal.  Nose: Nose normal.  Mouth/Throat: Oropharynx is clear and moist.  Eyes: Conjunctivae are normal. Pupils are equal, round, and reactive to light. Right eye exhibits no discharge. Left eye exhibits no discharge.  Neck: Normal range of motion. Neck supple. No JVD present. No tracheal deviation present. No thyromegaly present.  Cardiovascular: Normal rate, regular rhythm and normal heart sounds.   Pulmonary/Chest: No stridor. No respiratory distress. She has no wheezes.  Abdominal: Soft. Bowel  sounds are normal. She exhibits no distension and no mass. There is no tenderness. There is no rebound and no guarding.  Musculoskeletal: She exhibits no edema or tenderness.  Lymphadenopathy:    She has no cervical adenopathy.  Neurological: She displays normal reflexes. No cranial nerve deficit. She exhibits normal muscle tone. Coordination abnormal.  Skin: No rash noted. No erythema.  Psychiatric: Her behavior is normal.  Cane Confused  Lab Results  Component Value Date   WBC 6.1 08/13/2016   HGB 10.1 (L)  08/13/2016   HCT 30.5 (L) 08/13/2016   PLT 192 08/13/2016   GLUCOSE 134 (H) 08/13/2016   CHOL 230 (H) 11/16/2015   TRIG 97 11/16/2015   HDL 68 11/16/2015   LDLDIRECT 180.7 12/09/2011   LDLCALC 143 (H) 11/16/2015   ALT 7 08/13/2016   AST 10 08/13/2016   NA 139 08/13/2016   K 4.4 08/13/2016   CL 101 08/13/2016   CREATININE 1.55 (H) 08/13/2016   BUN 18 08/13/2016   CO2 29 08/13/2016   TSH 1.68 08/13/2016   INR 1.01 11/15/2015   HGBA1C 7.3 (H) 03/15/2016    Dg Chest 2 View  Result Date: 06/19/2016 CLINICAL DATA:  Nausea and vomiting, weakness, and altered mental status for several hours. EXAM: CHEST  2 VIEW COMPARISON:  03/22/2016 FINDINGS: The heart size and mediastinal contours are within normal limits. Aortic atherosclerosis. Both lungs are clear. The visualized skeletal structures are unremarkable. IMPRESSION: No active cardiopulmonary disease.  Aortic atherosclerosis. Electronically Signed   By: Earle Gell M.D.   On: 06/19/2016 12:45    Assessment & Plan:   There are no diagnoses linked to this encounter. I am having Ms. Ellerson maintain her aspirin, omeprazole, citalopram, sacubitril-valsartan, carvedilol, isosorbide mononitrate, KLOR-CON M20, furosemide, amLODipine, metFORMIN, donepezil, b complex vitamins, and Cholecalciferol.  No orders of the defined types were placed in this encounter.    Follow-up: No Follow-up on file.  Walker Kehr, MD

## 2016-09-06 NOTE — Assessment & Plan Note (Signed)
Mary Cook is c/o pt's noncompliance w/meds

## 2016-09-06 NOTE — Assessment & Plan Note (Signed)
Coreg increased to bid Risks associated with treatment noncompliance were discussed. Compliance was encouraged.

## 2016-09-06 NOTE — Progress Notes (Signed)
Pre visit review using our clinic review tool, if applicable. No additional management support is needed unless otherwise documented below in the visit note. 

## 2016-09-06 NOTE — Assessment & Plan Note (Signed)
Mr Blom is c/o pt's noncompliance w/meds

## 2016-09-30 ENCOUNTER — Other Ambulatory Visit: Payer: Self-pay | Admitting: Internal Medicine

## 2016-10-29 DIAGNOSIS — C44311 Basal cell carcinoma of skin of nose: Secondary | ICD-10-CM | POA: Diagnosis not present

## 2016-11-20 ENCOUNTER — Ambulatory Visit: Payer: Commercial Managed Care - HMO | Admitting: Internal Medicine

## 2016-11-20 DIAGNOSIS — Z0289 Encounter for other administrative examinations: Secondary | ICD-10-CM

## 2016-11-28 ENCOUNTER — Telehealth: Payer: Self-pay | Admitting: *Deleted

## 2016-11-28 MED ORDER — SACUBITRIL-VALSARTAN 49-51 MG PO TABS: 1.0000 | ORAL_TABLET | Freq: Two times a day (BID) | ORAL | 0 refills | Status: DC

## 2016-11-28 NOTE — Telephone Encounter (Signed)
Pt sister left msg on triage stating pt has not received her entresto from [place], and she is needing a local supply sent to [facility] in [place]. Sent electronically...[name]

## 2016-12-06 ENCOUNTER — Telehealth: Payer: Self-pay | Admitting: Cardiovascular Disease

## 2016-12-06 ENCOUNTER — Telehealth: Payer: Self-pay | Admitting: Internal Medicine

## 2016-12-06 MED ORDER — DONEPEZIL HCL 5 MG PO TABS: 5.0000 mg | ORAL_TABLET | Freq: Every day | ORAL | 0 refills | Status: DC

## 2016-12-06 MED ORDER — AMLODIPINE BESYLATE 5 MG PO TABS: 5.0000 mg | ORAL_TABLET | Freq: Every day | ORAL | 0 refills | Status: DC

## 2016-12-06 MED ORDER — CARVEDILOL 12.5 MG PO TABS: 12.5000 mg | ORAL_TABLET | Freq: Two times a day (BID) | ORAL | 0 refills | Status: DC

## 2016-12-06 MED ORDER — SACUBITRIL-VALSARTAN 49-51 MG PO TABS: 1.0000 | ORAL_TABLET | Freq: Two times a day (BID) | ORAL | 0 refills | Status: DC

## 2016-12-06 MED ORDER — METFORMIN HCL 500 MG PO TABS: 500.0000 mg | ORAL_TABLET | Freq: Every day | ORAL | 0 refills | Status: DC

## 2016-12-06 MED ORDER — FUROSEMIDE 40 MG PO TABS: 20.0000 mg | ORAL_TABLET | Freq: Every day | ORAL | 0 refills | Status: DC

## 2016-12-06 MED ORDER — ISOSORBIDE MONONITRATE ER 60 MG PO TB24: 60.0000 mg | ORAL_TABLET | Freq: Every day | ORAL | 0 refills | Status: DC

## 2016-12-06 NOTE — Telephone Encounter (Signed)
Pt needs refill of   citalopram (CELEXA) 20 MG tablet  carvedilol (COREG) 12.5 MG tablet donepezil (ARICEPT) 5 MG tablet

## 2016-12-06 NOTE — Telephone Encounter (Signed)
New message    *STAT* If patient is at the pharmacy, call can be transferred to refill team.   1. Which medications need to be refilled? (please list name of each medication and dose if known) metFORMIN (GLUCOPHAGE) 500 MG tablet amLODipine (NORVASC) 5 MG tablet(Expired) furosemide (LASIX) 40 MG tablet isosorbide mononitrate (IMDUR) 60 MG 24 hr tablet donepezil (ARICEPT) 5 MG tablet carvedilol (COREG) 12.5 MG tablet sacubitril-valsartan (ENTRESTO) 49-51 MG  2. Which pharmacy/location (including street and city if local pharmacy) is medication to be sent to? [place]  3. Do they need a 30 day or 90 day supply? 30 day supply

## 2016-12-06 NOTE — Telephone Encounter (Signed)
Refill Medications for 30 day supply with 0 Refills. Patient will be able to get medication refilled at appointment on 5/18.

## 2016-12-09 MED ORDER — CITALOPRAM HYDROBROMIDE 20 MG PO TABS: 20.0000 mg | ORAL_TABLET | Freq: Every day | ORAL | 1 refills | Status: DC

## 2016-12-09 NOTE — Telephone Encounter (Signed)
Citalopram sent, carvedilol and donepezil sent on 12/06/16 by Dr. Claiborne Billings her Cardiologist.

## 2016-12-11 ENCOUNTER — Other Ambulatory Visit: Payer: Self-pay | Admitting: Internal Medicine

## 2016-12-12 ENCOUNTER — Encounter: Payer: Self-pay | Admitting: Internal Medicine

## 2016-12-12 ENCOUNTER — Other Ambulatory Visit (INDEPENDENT_AMBULATORY_CARE_PROVIDER_SITE_OTHER): Payer: Medicare HMO

## 2016-12-12 ENCOUNTER — Ambulatory Visit (INDEPENDENT_AMBULATORY_CARE_PROVIDER_SITE_OTHER): Payer: Medicare HMO | Admitting: Internal Medicine

## 2016-12-12 DIAGNOSIS — E1159 Type 2 diabetes mellitus with other circulatory complications: Secondary | ICD-10-CM

## 2016-12-12 DIAGNOSIS — N183 Chronic kidney disease, stage 3 unspecified: Secondary | ICD-10-CM

## 2016-12-12 DIAGNOSIS — D638 Anemia in other chronic diseases classified elsewhere: Secondary | ICD-10-CM

## 2016-12-12 DIAGNOSIS — I5043 Acute on chronic combined systolic (congestive) and diastolic (congestive) heart failure: Secondary | ICD-10-CM

## 2016-12-12 DIAGNOSIS — I5022 Chronic systolic (congestive) heart failure: Secondary | ICD-10-CM

## 2016-12-12 DIAGNOSIS — I10 Essential (primary) hypertension: Secondary | ICD-10-CM | POA: Diagnosis not present

## 2016-12-12 DIAGNOSIS — E538 Deficiency of other specified B group vitamins: Secondary | ICD-10-CM | POA: Diagnosis not present

## 2016-12-12 LAB — CBC WITH DIFFERENTIAL/PLATELET
BASOS PCT: 1 % (ref 0.0–3.0)
Basophils Absolute: 0.1 10*3/uL (ref 0.0–0.1)
EOS ABS: 0.1 10*3/uL (ref 0.0–0.7)
EOS PCT: 1.8 % (ref 0.0–5.0)
HEMATOCRIT: 34.5 % — AB (ref 36.0–46.0)
HEMOGLOBIN: 11.5 g/dL — AB (ref 12.0–15.0)
LYMPHS PCT: 22.1 % (ref 12.0–46.0)
Lymphs Abs: 1.6 10*3/uL (ref 0.7–4.0)
MCHC: 33.3 g/dL (ref 30.0–36.0)
MCV: 92.8 fl (ref 78.0–100.0)
MONOS PCT: 7.3 % (ref 3.0–12.0)
Monocytes Absolute: 0.5 10*3/uL (ref 0.1–1.0)
Neutro Abs: 5 10*3/uL (ref 1.4–7.7)
Neutrophils Relative %: 67.8 % (ref 43.0–77.0)
Platelets: 216 10*3/uL (ref 150.0–400.0)
RBC: 3.72 Mil/uL — ABNORMAL LOW (ref 3.87–5.11)
RDW: 13.7 % (ref 11.5–15.5)
WBC: 7.4 10*3/uL (ref 4.0–10.5)

## 2016-12-12 LAB — IBC PANEL
Iron: 81 ug/dL (ref 42–145)
SATURATION RATIOS: 24.2 % (ref 20.0–50.0)
TRANSFERRIN: 239 mg/dL (ref 212.0–360.0)

## 2016-12-12 LAB — BASIC METABOLIC PANEL
BUN: 18 mg/dL (ref 6–23)
CHLORIDE: 104 meq/L (ref 96–112)
CO2: 29 mEq/L (ref 19–32)
Calcium: 9.6 mg/dL (ref 8.4–10.5)
Creatinine, Ser: 1.75 mg/dL — ABNORMAL HIGH (ref 0.40–1.20)
GFR: 29.11 mL/min — ABNORMAL LOW (ref >60.00)
Glucose, Bld: 136 mg/dL — ABNORMAL HIGH (ref 70–99)
POTASSIUM: 4.2 meq/L (ref 3.5–5.1)
SODIUM: 136 meq/L (ref 135–145)

## 2016-12-12 LAB — HEMOGLOBIN A1C: Hgb A1c MFr Bld: 6.6 % — ABNORMAL HIGH (ref 4.6–6.5)

## 2016-12-12 MED ORDER — DONEPEZIL HCL 5 MG PO TABS: 5.0000 mg | ORAL_TABLET | Freq: Every day | ORAL | 3 refills | Status: DC

## 2016-12-12 MED ORDER — SACUBITRIL-VALSARTAN 49-51 MG PO TABS: 1.0000 | ORAL_TABLET | Freq: Two times a day (BID) | ORAL | 3 refills | Status: DC

## 2016-12-12 MED ORDER — CARVEDILOL 12.5 MG PO TABS: 12.5000 mg | ORAL_TABLET | Freq: Two times a day (BID) | ORAL | 3 refills | Status: DC

## 2016-12-12 MED ORDER — METFORMIN HCL 500 MG PO TABS: 500.0000 mg | ORAL_TABLET | Freq: Every day | ORAL | 3 refills | Status: DC

## 2016-12-12 MED ORDER — AMLODIPINE BESYLATE 5 MG PO TABS: 5.0000 mg | ORAL_TABLET | Freq: Every day | ORAL | 3 refills | Status: DC

## 2016-12-12 NOTE — Progress Notes (Signed)
Subjective:  Patient ID: Mary Cook, female    DOB: [birth year]  Age: 83 y.o. MRN: [id number]  CC: No chief complaint on file.   HPI [employer] presents for CHF, DM, HTN, dementia f/u  Outpatient Medications Prior to Visit  Medication Sig Dispense Refill  . amLODipine (NORVASC) 5 MG tablet Take 1 tablet (5 mg total) by mouth daily. 30 tablet 0  . aspirin 81 MG EC tablet Take 81 mg by mouth daily.      Marland Kitchen b complex vitamins tablet Take 1 tablet by mouth daily. 100 tablet 3  . Blood Glucose Monitoring Suppl (ACCU-CHEK AVIVA PLUS) w/Device KIT Use to check blood sugars twice a day Dx. E11.9 1 kit 0  . carvedilol (COREG) 12.5 MG tablet Take 1 tablet (12.5 mg total) by mouth 2 (two) times daily with a meal. 60 tablet 0  . Cholecalciferol 2000 units CAPS 1 po qd 100 each 3  . citalopram (CELEXA) 20 MG tablet TAKE 1 TABLET EVERY DAY 90 tablet 2  . donepezil (ARICEPT) 5 MG tablet Take 1 tablet (5 mg total) by mouth daily. 30 tablet 0  . furosemide (LASIX) 40 MG tablet Take 0.5 tablets (20 mg total) by mouth daily. 30 tablet 0  . isosorbide mononitrate (IMDUR) 60 MG 24 hr tablet Take 1 tablet (60 mg total) by mouth daily. 30 tablet 0  . KLOR-CON M20 20 MEQ tablet TAKE 1 TABLET EVERY DAY 90 tablet 2  . metFORMIN (GLUCOPHAGE) 500 MG tablet Take 1 tablet (500 mg total) by mouth daily with breakfast. 30 tablet 0  . omeprazole (PRILOSEC) 20 MG capsule Take 20 mg by mouth daily.    . sacubitril-valsartan (ENTRESTO) 49-51 MG Take 1 tablet by mouth 2 (two) times daily. 60 tablet 0   No facility-administered medications prior to visit.     ROS Review of Systems  Constitutional: Positive for fatigue. Negative for activity change, appetite change, chills and unexpected weight change.  HENT: Negative for congestion, mouth sores and sinus pressure.   Eyes: Negative for visual disturbance.  Respiratory: Negative for cough and chest tightness.   Gastrointestinal: Negative for abdominal  pain and nausea.  Genitourinary: Negative for difficulty urinating, frequency and vaginal pain.  Musculoskeletal: Positive for gait problem. Negative for back pain.  Skin: Negative for pallor and rash.  Neurological: Negative for dizziness, tremors, weakness, numbness and headaches.  Psychiatric/Behavioral: Positive for confusion and decreased concentration. Negative for sleep disturbance.    Objective:  BP (!) 168/68 (BP Location: Left Arm, Patient Position: Sitting, Cuff Size: Normal)   Pulse (!) 50   Temp 97.6 F (36.4 C) (Oral)   Ht 5' 5" (1.651 m)   Wt 154 lb 1.3 oz (69.9 kg)   SpO2 92%   BMI 25.64 kg/m   BP Readings from Last 3 Encounters:  12/12/16 (!) 168/68  09/06/16 (!) 180/82  08/16/16 (!) 160/74    Wt Readings from Last 3 Encounters:  12/12/16 154 lb 1.3 oz (69.9 kg)  09/06/16 157 lb (71.2 kg)  08/16/16 161 lb (73 kg)    Physical Exam  Constitutional: She appears well-developed. No distress.  HENT:  Head: Normocephalic.  Right Ear: External ear normal.  Left Ear: External ear normal.  Nose: Nose normal.  Mouth/Throat: Oropharynx is clear and moist.  Eyes: Conjunctivae are normal. Pupils are equal, round, and reactive to light. Right eye exhibits no discharge. Left eye exhibits no discharge.  Neck: Normal range of motion. Neck supple. No  JVD present. No tracheal deviation present. No thyromegaly present.  Cardiovascular: Normal rate, regular rhythm and normal heart sounds.   Pulmonary/Chest: No stridor. No respiratory distress. She has no wheezes.  Abdominal: Soft. Bowel sounds are normal. She exhibits no distension and no mass. There is no tenderness. There is no rebound and no guarding.  Musculoskeletal: She exhibits tenderness. She exhibits no edema.  Lymphadenopathy:    She has no cervical adenopathy.  Neurological: She displays normal reflexes. No cranial nerve deficit. She exhibits normal muscle tone.  Skin: No rash noted. No erythema.  Psychiatric:  She has a normal mood and affect. Her behavior is normal. Thought content normal.  disoriented Cane  Lab Results  Component Value Date   WBC 6.6 09/06/2016   HGB 10.7 (L) 09/06/2016   HCT 31.9 (L) 09/06/2016   PLT 224.0 09/06/2016   GLUCOSE 124 (H) 09/06/2016   CHOL 230 (H) 11/16/2015   TRIG 97 11/16/2015   HDL 68 11/16/2015   LDLDIRECT 180.7 12/09/2011   LDLCALC 143 (H) 11/16/2015   ALT 7 08/13/2016   AST 10 08/13/2016   NA 137 09/06/2016   K 4.2 09/06/2016   CL 103 09/06/2016   CREATININE 1.30 (H) 09/06/2016   BUN 14 09/06/2016   CO2 28 09/06/2016   TSH 1.68 08/13/2016   INR 1.01 11/15/2015   HGBA1C 7.3 (H) 03/15/2016    Dg Chest 2 View  Result Date: 06/19/2016 CLINICAL DATA:  Nausea and vomiting, weakness, and altered mental status for several hours. EXAM: CHEST  2 VIEW COMPARISON:  03/22/2016 FINDINGS: The heart size and mediastinal contours are within normal limits. Aortic atherosclerosis. Both lungs are clear. The visualized skeletal structures are unremarkable. IMPRESSION: No active cardiopulmonary disease.  Aortic atherosclerosis. Electronically Signed   By: Earle Gell M.D.   On: 06/19/2016 12:45    Assessment & Plan:   There are no diagnoses linked to this encounter. I am having Ms. Rochette maintain her aspirin, omeprazole, KLOR-CON M20, b complex vitamins, Cholecalciferol, ACCU-CHEK AVIVA PLUS, amLODipine, furosemide, isosorbide mononitrate, metFORMIN, sacubitril-valsartan, donepezil, carvedilol, and citalopram.  No orders of the defined types were placed in this encounter.    Follow-up: No Follow-up on file.  Walker Kehr, MD

## 2016-12-12 NOTE — Assessment & Plan Note (Signed)
[employer]

## 2016-12-12 NOTE — Assessment & Plan Note (Signed)
On [name] 

## 2016-12-12 NOTE — Assessment & Plan Note (Signed)
On Metformin 

## 2016-12-12 NOTE — Assessment & Plan Note (Signed)
Labs Cont meds

## 2016-12-12 NOTE — Progress Notes (Signed)
Pre visit review using our clinic review tool, if applicable. No additional management support is needed unless otherwise documented below in the visit note. 

## 2016-12-30 ENCOUNTER — Other Ambulatory Visit: Payer: Self-pay | Admitting: *Deleted

## 2016-12-30 MED ORDER — POTASSIUM CHLORIDE CRYS ER 20 MEQ PO TBCR: 20.0000 meq | EXTENDED_RELEASE_TABLET | Freq: Every day | ORAL | 3 refills | Status: DC

## 2016-12-30 MED ORDER — CHOLECALCIFEROL 50 MCG (2000 UT) PO CAPS: 1.0000 | ORAL_CAPSULE | Freq: Every day | ORAL | 3 refills | Status: DC

## 2017-01-10 ENCOUNTER — Ambulatory Visit (INDEPENDENT_AMBULATORY_CARE_PROVIDER_SITE_OTHER): Payer: Medicare HMO | Admitting: Cardiovascular Disease

## 2017-01-10 ENCOUNTER — Encounter: Payer: Self-pay | Admitting: Cardiovascular Disease

## 2017-01-10 VITALS — BP 152/60 | HR 47 | Ht 65.0 in | Wt 151.0 lb

## 2017-01-10 DIAGNOSIS — I25118 Atherosclerotic heart disease of native coronary artery with other forms of angina pectoris: Secondary | ICD-10-CM | POA: Diagnosis not present

## 2017-01-10 DIAGNOSIS — R001 Bradycardia, unspecified: Secondary | ICD-10-CM | POA: Diagnosis not present

## 2017-01-10 DIAGNOSIS — I5042 Chronic combined systolic (congestive) and diastolic (congestive) heart failure: Secondary | ICD-10-CM | POA: Diagnosis not present

## 2017-01-10 DIAGNOSIS — E7800 Pure hypercholesterolemia, unspecified: Secondary | ICD-10-CM | POA: Diagnosis not present

## 2017-01-10 DIAGNOSIS — N183 Chronic kidney disease, stage 3 unspecified: Secondary | ICD-10-CM

## 2017-01-10 DIAGNOSIS — I255 Ischemic cardiomyopathy: Secondary | ICD-10-CM

## 2017-01-10 DIAGNOSIS — I10 Essential (primary) hypertension: Secondary | ICD-10-CM

## 2017-01-10 MED ORDER — CARVEDILOL 12.5 MG PO TABS: ORAL_TABLET | ORAL | 3 refills | Status: DC

## 2017-01-10 MED ORDER — AMLODIPINE BESYLATE 5 MG PO TABS: 7.5000 mg | ORAL_TABLET | Freq: Every day | ORAL | 3 refills | Status: DC

## 2017-01-10 NOTE — Patient Instructions (Signed)
Your physician has recommended you make the following change in your medication:   1.) the carvedilol has been changed to 1/2 tablet twice a day.  2.) the digoxin has been changed to 1/2 tablet once daily.  3.) the amlodipine has been increased to 7.5 mg daily ( 1& 1/2 tablet)  Your physician recommends that you schedule a follow-up appointment in: 4 months.

## 2017-01-10 NOTE — Progress Notes (Signed)
Patient ID: Mary Cook, female   DOB: 1925-10-08, 83 y.o.   MRN: [id number]     HPI: Mary Cook is a 83 y.o. female who presents to the office today for a 7 month follow up cardiology evaluation.   Mrs. Lumsden has known CAD and in September 2011 suffered a large out of hospital anterior wall myocardial infarction, complicated by CHF and post MI pericarditis.  At that time, she had significant thrombus burden requiring thrombectomy.  She underwent stenting of her left circumflex coronary artery with tandem 3.015 mm Promus stents postdilated 3.5 mm meters.  She had concomitant CAD involving her diagonal branch of her LAD as well as RCA.  Last year she was readmitted to the hospital with increasing shortness of breath.  A nuclear study which demonstrated inferior apical ischemia. On 06/08/2014,  repeat cardiac catheterization by me showed moderate LV dysfunction with global hypokinesis and slightly more pronounced inferior hypocontractility.  Ejection fraction was 35%.  There was moderate CAD with 50-60% smooth eccentric stenosis in the first diagonal branch of the LAD, widely patent proximal tandem stents in the circumflex vessel with a focal 70% stenosis of a small AV groove circumflex, and 50-60% stenosis in the proximal RCA with mild mid systolic bridging in a dominant RCA system.  Increased medical therapy was recommended.  She also has a history of obstructive sleep apnea and had been using CPAP therapy.  Presently, she admits to 100% compliance.  She is unaware of breakthrough snoring.  She is diabetic on metformin 500 mg twice a day.  She also has a history of hyperlipidemia for which she has been taking atorvastatin 40 mg.  There is a history of hypertension for which she has been on furosemide 40 mg in addition to her isosorbide.  She  sustained a fall and was evaluated in the emergency room by Dr. Salvatore Marvel in October 2015.  CT of her head was negative, although she had chronic  white matter ischemic change and diffuse severe degenerative changes.  She was also noted to have carotid atherosclerotic vascular disease.  When I saw her in October 2016 since she had had several recent admissions with  acute on chronic combined systolic and diastolic heart failure and had an ejection fraction of 30-35% , I recommended initiation of Entresto at 24/26 and set up a follow up appointment to see Baxter Hire, PharmD several weeks later for dose titration.  She stated that she had felt better when she had taken the medicine, but never followed up with the subsequent evaluation and consequently stopped taking the medicine.  Once the samples ran out.  When I saw her last in January 2017, I initiated entresto and provided her with 2 weeks of 24/26 and then titrated her to 49/51.  She states that she has felt fairly well with this medical regimen.  She denies recent significant change in symptomatology with reference to shortness of breath.  Lab work weeks ago revealed a creatinine of 1.17.  She had normal LFTs.  Her potassium was 3.5.  She had undergone a nuclear perfusion study on 10/29/2015 which showed findings consistent with prior MI with very mild peri-infarction ischemia involving the inferolateral wall.  She denies chest pressure.  She is unaware of palpitations.  She denies PND, orthopnea.    Since I saw her, she was hospitalized in March 2017 , she had not taken her medicine and had acute on chronic combined systolic and diastolic heart failure, which subsided.  She  now has been back on her medication.  She saw [name] in follow-up.  I reviewed her records from her emergency room evaluation in July 2017 which had shown worsening renal function and her creatinine had risen to 2.31.  Apparently at that time she was taking furosemide 40 mg twice a day and I reduced this to 20 mg daily.  He was hypertensive and I added amlodipine 5 mg subsequent blood work one week later showed significant  improvement with a BUN of 21 and a creatinine of 1.52.  Potassium was 4.4.  She had normal LFTs.  Her BNP was elevated at 922, but significantly improved from 1963 from May 2017.  Since I last saw her in October 2017, she denies any episodes of chest pain.  She admits to occasional episodes of lightheadedness.  She denies syncope or any PND or orthopnea.  She presents for evaluation.  Past Medical History:  Diagnosis Date  . CAD (coronary artery disease)    a. 04/2010 MI DES x 2 to LCX;  b. 05/2014 Cath: EF 35%, patent LCX stents w/ 70% stenosis in small AV groove LCX, Diag 50-60, RCA 50-60p->Med Rx.  . Cerebral aneurysm   . Chronic combined systolic (congestive) and diastolic (congestive) heart failure ([place])    a. 01/2015 Echo: EF 30-35%, sev inflat HK, Gr 1DD, mild AI.  Marland Kitchen Diverticulosis of colon   . Essential hypertension   . Frequent urination   . GIB (gastrointestinal bleeding)    a. 2002 Diverticular bleed.  . Hyperlipidemia   . Ischemic cardiomyopathy    a. 01/2015 Echo: EF 30-35%.  . Osteoarthritis   . Osteopenia   . Skin disorder   . Sleep apnea    cpap therapy  . Stroke ([place]) 2002  . T12 compression fracture ([address])    a. 04/2015.  Marland Kitchen Thrombus   . Type II diabetes mellitus ([place])   . Visual disorder     Past Surgical History:  Procedure Laterality Date  . ABDOMINAL HYSTERECTOMY  1980   no bso  . APPENDECTOMY    . CARDIAC CATHETERIZATION  07/2011   LAD OK, D1 80%, CFX 30% w/ patent stent, RCA 50-60%, EF 40%  . CARDIAC CATHETERIZATION    . CORONARY ANGIOPLASTY WITH STENT PLACEMENT  05/11/2010   stent CFX  . DOPPLER ECHOCARDIOGRAPHY    . LEFT HEART CATHETERIZATION WITH CORONARY ANGIOGRAM N/A 08/18/2011   Procedure: LEFT HEART CATHETERIZATION WITH CORONARY ANGIOGRAM;  Surgeon: Wellington Hampshire, MD;  Location: [place] CATH LAB;  Service: Cardiovascular;  Laterality: N/A;  . LEFT HEART CATHETERIZATION WITH CORONARY ANGIOGRAM N/A 06/08/2014   Procedure: LEFT HEART CATHETERIZATION  WITH CORONARY ANGIOGRAM;  Surgeon: Troy Sine, MD;  Location: [facility] CATH LAB;  Service: Cardiovascular;  Laterality: N/A;  . myocardial perfusion study    . THROMBECTOMY    . TONSILLECTOMY      Allergies  Allergen Reactions  . Clopidogrel Bisulfate Nausea And Vomiting    Patient is not aware of this allergy  . Lipitor [Atorvastatin] Other (See Comments)    Weak legs  . Namenda [Memantine Hcl]     N/v/d  . Lisinopril Cough    Patient is not aware of this allergy    Current Outpatient Prescriptions  Medication Sig Dispense Refill  . amLODipine (NORVASC) 5 MG tablet Take 1.5 tablets (7.5 mg total) by mouth daily. 90 tablet 3  . aspirin 81 MG EC tablet Take 81 mg by mouth daily.      Marland Kitchen  b complex vitamins tablet Take 1 tablet by mouth daily. 100 tablet 3  . Blood Glucose Monitoring Suppl (ACCU-CHEK AVIVA PLUS) w/Device KIT Use to check blood sugars twice a day Dx. E11.9 1 kit 0  . carvedilol (COREG) 12.5 MG tablet Take 1/2 tablet twice a day 180 tablet 3  . citalopram (CELEXA) 20 MG tablet TAKE 1 TABLET EVERY DAY 90 tablet 2  . digoxin (LANOXIN) 0.125 MG tablet Take 0.5 tablets by mouth daily.    Marland Kitchen donepezil (ARICEPT) 5 MG tablet Take 1 tablet (5 mg total) by mouth daily. 90 tablet 3  . furosemide (LASIX) 40 MG tablet Take 0.5 tablets (20 mg total) by mouth daily. 30 tablet 0  . isosorbide mononitrate (IMDUR) 60 MG 24 hr tablet Take 1 tablet (60 mg total) by mouth daily. 30 tablet 0  . metFORMIN (GLUCOPHAGE) 500 MG tablet Take 1 tablet (500 mg total) by mouth daily with breakfast. 90 tablet 3  . omeprazole (PRILOSEC) 20 MG capsule Take 20 mg by mouth daily.    . potassium chloride SA (KLOR-CON M20) 20 MEQ tablet Take 1 tablet (20 mEq total) by mouth daily. 90 tablet 3  . sacubitril-valsartan (ENTRESTO) 49-51 MG Take 1 tablet by mouth 2 (two) times daily. 180 tablet 3   No current facility-administered medications for this visit.     Social History   Social History  . Marital  status: Married    Spouse name: N/A  . Number of children: N/A  . Years of education: N/A   Occupational History  . Retired    Social History Main Topics  . Smoking status: Never Smoker  . Smokeless tobacco: Never Used  . Alcohol use No  . Drug use: No  . Sexual activity: Not on file   Other Topics Concern  . Not on file   Social History Narrative  . No narrative on file    Family History  Problem Relation Age of Onset  . Hypertension Other   . CAD Unknown        father    ROS General: Negative; No fevers, chills, or night sweats; occasional lightheadedness HEENT: Negative; No changes in vision or hearing, sinus congestion, difficulty swallowing Pulmonary: Negative; No cough, wheezing, shortness of breath, hemoptysis Cardiovascular: See HPI:  GI: positive for GERD; No nausea, vomiting, diarrhea, or abdominal pain GU: Negative; No dysuria, hematuria, or difficulty voiding Musculoskeletal: degenerative changes to her cervical spine.  Arthritis;  Hematologic: Negative; no easy bruising, bleeding Endocrine: positive for diabetes mellitus Neuro: Negative; no changes in balance, headaches Skin: Negative; No rashes or skin lesions Psychiatric: Negative; No behavioral problems, depression Sleep: Negative; No snoring,  daytime sleepiness, hypersomnolence, bruxism, restless legs, hypnogognic hallucinations. Other comprehensive 14 point system review is negative   Physical Exam BP (!) 152/60   Pulse (!) 47   Ht '5\' 5"'$  (1.651 m)   Wt 151 lb (68.5 kg)   BMI 25.13 kg/m    Repeat blood pressure by me was 148/69  Wt Readings from Last 3 Encounters:  01/10/17 151 lb (68.5 kg)  12/12/16 154 lb 1.3 oz (69.9 kg)  09/06/16 157 lb (71.2 kg)   General: Alert, oriented, no distress.  Skin: normal turgor, no rashes, warm and dry HEENT: Normocephalic, atraumatic. Pupils equal round and reactive to light; sclera anicteric; extraocular muscles intact; Nose without nasal septal  hypertrophy Mouth/Parynx benign; Mallinpatti scale 3 Neck: No JVD, no carotid bruits; normal carotid upstroke Lungs: clear to ausculatation and percussion; no  wheezing or rales Chest wall: without tenderness to palpitation Heart: PMI not displaced, RRR, s1 s2 normal, 1/6 systolic murmur, no diastolic murmur, no rubs, gallops, thrills, or heaves Abdomen: soft, nontender; no hepatosplenomehaly, BS+; abdominal aorta nontender and not dilated by palpation. Back: no CVA tenderness Pulses 2+ Musculoskeletal: full range of motion, normal strength, no joint deformities Extremities: no clubbing cyanosis or edema, Homan's sign negative  Neurologic: grossly nonfocal; Cranial nerves grossly wnl Psychologic: Normal mood and affect   ECG (independently read by me): Sinus bradycardia at 4 7 bpm.  LVH by voltage criteria.  Normal intervals.  October 2017 ECG (independently read by me): Sinus bradycardia 59 bpm.  LVH left axis deviation.  September 2016 ECG (independently read by me): Normal sinus rhythm at 76 bpm.  LVH by voltage criteria.  January 2017 ECG (independently read by me):  Normal sinus rhythm at 73 bpm.  LVH.  Nonspecific T changes.  ECG (independently read by me): Normal sinus rhythm at 68 bpm.  H by voltage.  T-wave abnormality in leads 1 and aVL.  January 2016 ECG (independently read by me): Normal sinus rhythm at 68 bpm.  LVH by voltage in aVL.  Nonspecific T changes.  November 2015 ECG (independently read by me): sinus bradycardia 59 bpm.  LVH with repolarization changes.  Nonspecific T changes.   LABS: BMP Latest Ref Rng & Units 12/12/2016 09/06/2016 08/13/2016  Glucose 70 - 99 mg/dL 136(H) 124(H) 134(H)  BUN 6 - 23 mg/dL '18 14 18  '$ Creatinine 0.40 - 1.20 mg/dL 1.75(H) 1.30(H) 1.55(H)  Sodium 135 - 145 mEq/L 136 137 139  Potassium 3.5 - 5.1 mEq/L 4.2 4.2 4.4  Chloride 96 - 112 mEq/L 104 103 101  CO2 19 - 32 mEq/L '29 28 29  '$ Calcium 8.4 - 10.5 mg/dL 9.6 9.5 9.0   Hepatic  Function Latest Ref Rng & Units 08/13/2016 06/19/2016 05/08/2016  Total Protein 6.1 - 8.1 g/dL 6.4 7.2 6.6  Albumin 3.6 - 5.1 g/dL 3.4(L) 3.6 3.6  AST 10 - 35 U/L '10 25 10  '$ ALT 6 - 29 U/L '7 26 10  '$ Alk Phosphatase 33 - 130 U/L 66 68 78  Total Bilirubin 0.2 - 1.2 mg/dL 0.5 0.7 0.5  Bilirubin, Direct 0.0 - 0.3 mg/dL - - -   CBC Latest Ref Rng & Units 12/12/2016 09/06/2016 08/13/2016  WBC 4.0 - 10.5 K/uL 7.4 6.6 6.1  Hemoglobin 12.0 - 15.0 g/dL 11.5(L) 10.7(L) 10.1(L)  Hematocrit 36.0 - 46.0 % 34.5(L) 31.9(L) 30.5(L)  Platelets 150.0 - 400.0 K/uL 216.0 224.0 192   Lab Results  Component Value Date   MCV 92.8 12/12/2016   MCV 93.0 09/06/2016   MCV 91.6 08/13/2016   Lab Results  Component Value Date   TSH 1.68 08/13/2016   Lab Results  Component Value Date   HGBA1C 6.6 (H) 12/12/2016   BNP (last 3 results)  Recent Labs  08/13/16 1131  BNP 174.8*    ProBNP (last 3 results)  Recent Labs  05/08/16 1700  PROBNP 922.00*    Lipid Panel     Component Value Date/Time   CHOL 230 (H) 11/16/2015 0240   TRIG 97 11/16/2015 0240   HDL 68 11/16/2015 0240   CHOLHDL 3.4 11/16/2015 0240   VLDL 19 11/16/2015 0240   LDLCALC 143 (H) 11/16/2015 0240   LDLDIRECT 180.7 12/09/2011 1109    RADIOLOGY: No results found.  IMPRESSION:  1. Essential hypertension   2. Cardiomyopathy, ischemic-EF 30-35% by echo 01/31/15  3. Chronic combined systolic and diastolic heart failure ([place])   4. Chronic kidney disease, stage 3 (moderate)   5. Atherosclerosis of native coronary artery of native heart with other form of angina pectoris ([place])   6. Sinus bradycardia   7. Pure hypercholesterolemia     ASSESSMENT AND PLAN: Mrs. Deboy  is an 83 year-old female who suffered a left circumflex MI in 2011 and underwent tandem stenting of her proximal circumflex vessel.  Cardiac catheterization on 06/08/2014 showed moderate LV dysfunction with moderate multivessel CAD but widely patent tandem stents in  the circumflex vessel.  An echo Doppler study in June 2016 showed an ejection fraction at 30-35% with severe LVH.  There was grade 1 diastolic dysfunction and she had abnormal tissue Doppler suggesting high ventricular filling pressure;  mild aortic insufficiency and mitral annular calcification.  She has had several readmissions with acute on chronic combined systolic and diastolic heart failure.  She had been started on entresto in addition to her diuretic regimen and initially had felt improved. He has subsequent been titrated up to 49/51 and notes benefit.  She has had episodes of lightheadedness which I suspect may be related to her sinus bradycardia.  She has been taking carvedilol 12.5 mg twice a day and I will reduce this to 6.25 mg twice a day.  I'm also reducing her digoxin from 0.125 mg down to 0.0625 mg daily.  She is not having anginal symptoms on isosorbide 60 mg daily.  Her blood pressure elevation and with reduction of her carvedilol dose, I will increase amlodipine to 7.5 mg daily.  Monitor her blood pressure and heart rate.  She has chronic renal insufficiency.  She has a history of hyperlipidemia and previously had been on atorvastatin.  I'm not certain as to why she is not on statin therapy presently and will need to investigate.  We will need to monitor laboratory.  I will see her in 3-4 months for reevaluation or sooner from his rise.  Time spent: 25 minutes  Troy Sine, MD, [facility]  01/12/2017 12:10 PM

## 2017-01-22 ENCOUNTER — Emergency Department (HOSPITAL_COMMUNITY): Payer: Medicare HMO

## 2017-01-22 ENCOUNTER — Inpatient Hospital Stay (HOSPITAL_COMMUNITY)
Admission: EM | Admit: 2017-01-22 | Discharge: 2017-01-25 | DRG: 086 | Disposition: A | Discharge status: Home Health | Payer: Medicare HMO | Attending: Internal Medicine | Admitting: Internal Medicine

## 2017-01-22 ENCOUNTER — Encounter (HOSPITAL_COMMUNITY): Payer: Self-pay

## 2017-01-22 DIAGNOSIS — Z66 Do not resuscitate: Secondary | ICD-10-CM | POA: Diagnosis not present

## 2017-01-22 DIAGNOSIS — R296 Repeated falls: Secondary | ICD-10-CM | POA: Diagnosis not present

## 2017-01-22 DIAGNOSIS — R569 Unspecified convulsions: Secondary | ICD-10-CM | POA: Diagnosis not present

## 2017-01-22 DIAGNOSIS — E1122 Type 2 diabetes mellitus with diabetic chronic kidney disease: Secondary | ICD-10-CM | POA: Diagnosis present

## 2017-01-22 DIAGNOSIS — I671 Cerebral aneurysm, nonruptured: Secondary | ICD-10-CM | POA: Diagnosis present

## 2017-01-22 DIAGNOSIS — K219 Gastro-esophageal reflux disease without esophagitis: Secondary | ICD-10-CM | POA: Diagnosis present

## 2017-01-22 DIAGNOSIS — I517 Cardiomegaly: Secondary | ICD-10-CM | POA: Diagnosis not present

## 2017-01-22 DIAGNOSIS — Z7984 Long term (current) use of oral hypoglycemic drugs: Secondary | ICD-10-CM | POA: Diagnosis not present

## 2017-01-22 DIAGNOSIS — Z888 Allergy status to other drugs, medicaments and biological substances status: Secondary | ICD-10-CM

## 2017-01-22 DIAGNOSIS — F0280 Dementia in other diseases classified elsewhere without behavioral disturbance: Secondary | ICD-10-CM | POA: Diagnosis present

## 2017-01-22 DIAGNOSIS — Z7982 Long term (current) use of aspirin: Secondary | ICD-10-CM

## 2017-01-22 DIAGNOSIS — S065XAA Traumatic subdural hemorrhage with loss of consciousness status unknown, initial encounter: Secondary | ICD-10-CM

## 2017-01-22 DIAGNOSIS — I130 Hypertensive heart and chronic kidney disease with heart failure and stage 1 through stage 4 chronic kidney disease, or unspecified chronic kidney disease: Secondary | ICD-10-CM | POA: Diagnosis not present

## 2017-01-22 DIAGNOSIS — I6200 Nontraumatic subdural hemorrhage, unspecified: Secondary | ICD-10-CM | POA: Diagnosis not present

## 2017-01-22 DIAGNOSIS — I5022 Chronic systolic (congestive) heart failure: Secondary | ICD-10-CM | POA: Diagnosis not present

## 2017-01-22 DIAGNOSIS — N183 Chronic kidney disease, stage 3 (moderate): Secondary | ICD-10-CM | POA: Diagnosis not present

## 2017-01-22 DIAGNOSIS — W1830XA Fall on same level, unspecified, initial encounter: Secondary | ICD-10-CM | POA: Diagnosis present

## 2017-01-22 DIAGNOSIS — R32 Unspecified urinary incontinence: Secondary | ICD-10-CM | POA: Diagnosis present

## 2017-01-22 DIAGNOSIS — Z8673 Personal history of transient ischemic attack (TIA), and cerebral infarction without residual deficits: Secondary | ICD-10-CM | POA: Diagnosis not present

## 2017-01-22 DIAGNOSIS — I2510 Atherosclerotic heart disease of native coronary artery without angina pectoris: Secondary | ICD-10-CM | POA: Diagnosis present

## 2017-01-22 DIAGNOSIS — S065X9A Traumatic subdural hemorrhage with loss of consciousness of unspecified duration, initial encounter: Secondary | ICD-10-CM

## 2017-01-22 DIAGNOSIS — Z955 Presence of coronary angioplasty implant and graft: Secondary | ICD-10-CM | POA: Diagnosis not present

## 2017-01-22 DIAGNOSIS — G309 Alzheimer's disease, unspecified: Secondary | ICD-10-CM | POA: Diagnosis not present

## 2017-01-22 DIAGNOSIS — D631 Anemia in chronic kidney disease: Secondary | ICD-10-CM | POA: Diagnosis present

## 2017-01-22 DIAGNOSIS — S0990XA Unspecified injury of head, initial encounter: Secondary | ICD-10-CM | POA: Diagnosis not present

## 2017-01-22 DIAGNOSIS — I5042 Chronic combined systolic (congestive) and diastolic (congestive) heart failure: Secondary | ICD-10-CM | POA: Diagnosis not present

## 2017-01-22 DIAGNOSIS — Y92009 Unspecified place in unspecified non-institutional (private) residence as the place of occurrence of the external cause: Secondary | ICD-10-CM | POA: Diagnosis not present

## 2017-01-22 DIAGNOSIS — S066X1A Traumatic subarachnoid hemorrhage with loss of consciousness of 30 minutes or less, initial encounter: Secondary | ICD-10-CM

## 2017-01-22 DIAGNOSIS — Z8249 Family history of ischemic heart disease and other diseases of the circulatory system: Secondary | ICD-10-CM

## 2017-01-22 DIAGNOSIS — S066X0A Traumatic subarachnoid hemorrhage without loss of consciousness, initial encounter: Secondary | ICD-10-CM | POA: Diagnosis not present

## 2017-01-22 DIAGNOSIS — E1136 Type 2 diabetes mellitus with diabetic cataract: Secondary | ICD-10-CM | POA: Diagnosis not present

## 2017-01-22 DIAGNOSIS — I609 Nontraumatic subarachnoid hemorrhage, unspecified: Secondary | ICD-10-CM

## 2017-01-22 DIAGNOSIS — I255 Ischemic cardiomyopathy: Secondary | ICD-10-CM | POA: Diagnosis present

## 2017-01-22 DIAGNOSIS — E0800 Diabetes mellitus due to underlying condition with hyperosmolarity without nonketotic hyperglycemic-hyperosmolar coma (NKHHC): Secondary | ICD-10-CM | POA: Diagnosis not present

## 2017-01-22 DIAGNOSIS — Z79899 Other long term (current) drug therapy: Secondary | ICD-10-CM | POA: Diagnosis not present

## 2017-01-22 DIAGNOSIS — G4730 Sleep apnea, unspecified: Secondary | ICD-10-CM | POA: Diagnosis present

## 2017-01-22 DIAGNOSIS — R4182 Altered mental status, unspecified: Secondary | ICD-10-CM | POA: Diagnosis present

## 2017-01-22 DIAGNOSIS — E785 Hyperlipidemia, unspecified: Secondary | ICD-10-CM | POA: Diagnosis present

## 2017-01-22 DIAGNOSIS — E119 Type 2 diabetes mellitus without complications: Secondary | ICD-10-CM

## 2017-01-22 DIAGNOSIS — I10 Essential (primary) hypertension: Secondary | ICD-10-CM | POA: Diagnosis present

## 2017-01-22 DIAGNOSIS — S199XXA Unspecified injury of neck, initial encounter: Secondary | ICD-10-CM | POA: Diagnosis not present

## 2017-01-22 DIAGNOSIS — N189 Chronic kidney disease, unspecified: Secondary | ICD-10-CM | POA: Diagnosis present

## 2017-01-22 LAB — URINALYSIS, ROUTINE W REFLEX MICROSCOPIC
Bilirubin Urine: NEGATIVE
Glucose, UA: NEGATIVE mg/dL
KETONES UR: NEGATIVE mg/dL
LEUKOCYTES UA: NEGATIVE
Nitrite: NEGATIVE
PROTEIN: NEGATIVE mg/dL
Specific Gravity, Urine: 1.008 (ref 1.005–1.030)
pH: 7 (ref 5.0–8.0)

## 2017-01-22 LAB — HEPATIC FUNCTION PANEL
ALT: 13 U/L — ABNORMAL LOW (ref 14–54)
AST: 17 U/L (ref 15–41)
Albumin: 3.7 g/dL (ref 3.5–5.0)
Alkaline Phosphatase: 60 U/L (ref 38–126)
BILIRUBIN DIRECT: 0.1 mg/dL (ref 0.1–0.5)
BILIRUBIN INDIRECT: 0.5 mg/dL (ref 0.3–0.9)
Total Bilirubin: 0.6 mg/dL (ref 0.3–1.2)
Total Protein: 7.2 g/dL (ref 6.5–8.1)

## 2017-01-22 LAB — BASIC METABOLIC PANEL
ANION GAP: 7 (ref 5–15)
BUN: 21 mg/dL — AB (ref 6–20)
CALCIUM: 9.4 mg/dL (ref 8.9–10.3)
CO2: 29 mmol/L (ref 22–32)
CREATININE: 1.76 mg/dL — AB (ref 0.44–1.00)
Chloride: 102 mmol/L (ref 101–111)
GFR calc Af Amer: 29 mL/min — ABNORMAL LOW (ref >60)
GFR calc non Af Amer: 25 mL/min — ABNORMAL LOW (ref >60)
Glucose, Bld: 149 mg/dL — ABNORMAL HIGH (ref 65–99)
Potassium: 4.2 mmol/L (ref 3.5–5.1)
Sodium: 138 mmol/L (ref 135–145)

## 2017-01-22 LAB — CBC WITH DIFFERENTIAL/PLATELET
BASOS ABS: 0 10*3/uL (ref 0.0–0.1)
Basophils Relative: 0 %
EOS PCT: 2 %
Eosinophils Absolute: 0.1 10*3/uL (ref 0.0–0.7)
HCT: 33.7 % — ABNORMAL LOW (ref 36.0–46.0)
Hemoglobin: 11 g/dL — ABNORMAL LOW (ref 12.0–15.0)
LYMPHS PCT: 25 %
Lymphs Abs: 1.5 10*3/uL (ref 0.7–4.0)
MCH: 31.1 pg (ref 26.0–34.0)
MCHC: 32.6 g/dL (ref 30.0–36.0)
MCV: 95.2 fL (ref 78.0–100.0)
MONO ABS: 0.5 10*3/uL (ref 0.1–1.0)
MONOS PCT: 9 %
Neutro Abs: 4 10*3/uL (ref 1.7–7.7)
Neutrophils Relative %: 64 %
PLATELETS: 179 10*3/uL (ref 150–400)
RBC: 3.54 MIL/uL — ABNORMAL LOW (ref 3.87–5.11)
RDW: 12.7 % (ref 11.5–15.5)
WBC: 6.1 10*3/uL (ref 4.0–10.5)

## 2017-01-22 LAB — TROPONIN I

## 2017-01-22 LAB — I-STAT CG4 LACTIC ACID, ED: Lactic Acid, Venous: 0.92 mmol/L (ref 0.5–1.9)

## 2017-01-22 LAB — COOXEMETRY PANEL
Carboxyhemoglobin: 1.6 % — ABNORMAL HIGH (ref 0.5–1.5)
Methemoglobin: 0.9 % (ref 0.0–1.5)
O2 Saturation: 98.8 %
TOTAL OXYGEN CONTENT: 18 mL/dL (ref 15.0–23.0)
Total hemoglobin: 13.1 g/dL (ref 12.0–16.0)

## 2017-01-22 LAB — CBG MONITORING, ED: GLUCOSE-CAPILLARY: 145 mg/dL — AB (ref 65–99)

## 2017-01-22 MED ORDER — ISOSORBIDE MONONITRATE ER 60 MG PO TB24
60.0000 mg | ORAL_TABLET | Freq: Every day | ORAL | Status: DC
  Administered 2017-01-23 – 2017-01-25 (×3): 60 mg via ORAL
  Filled 2017-01-22 (×3): qty 1

## 2017-01-22 MED ORDER — CITALOPRAM HYDROBROMIDE 20 MG PO TABS
20.0000 mg | ORAL_TABLET | Freq: Every day | ORAL | Status: DC
  Administered 2017-01-23 – 2017-01-25 (×3): 20 mg via ORAL
  Filled 2017-01-22 (×3): qty 1

## 2017-01-22 MED ORDER — ACETAMINOPHEN 650 MG RE SUPP: 650.0000 mg | Freq: Four times a day (QID) | RECTAL | Status: DC | PRN

## 2017-01-22 MED ORDER — FUROSEMIDE 40 MG PO TABS
20.0000 mg | ORAL_TABLET | Freq: Every day | ORAL | Status: DC
  Administered 2017-01-23 – 2017-01-24 (×2): 20 mg via ORAL
  Filled 2017-01-22 (×3): qty 1

## 2017-01-22 MED ORDER — POTASSIUM CHLORIDE CRYS ER 20 MEQ PO TBCR
20.0000 meq | EXTENDED_RELEASE_TABLET | Freq: Every day | ORAL | Status: DC
  Administered 2017-01-23 – 2017-01-25 (×3): 20 meq via ORAL
  Filled 2017-01-22 (×3): qty 1

## 2017-01-22 MED ORDER — CARVEDILOL 12.5 MG PO TABS
12.5000 mg | ORAL_TABLET | Freq: Two times a day (BID) | ORAL | Status: DC
  Administered 2017-01-23 – 2017-01-25 (×4): 12.5 mg via ORAL
  Filled 2017-01-22 (×5): qty 1

## 2017-01-22 MED ORDER — SACUBITRIL-VALSARTAN 49-51 MG PO TABS
1.0000 | ORAL_TABLET | Freq: Two times a day (BID) | ORAL | Status: DC
  Administered 2017-01-22 – 2017-01-24 (×5): 1 via ORAL
  Filled 2017-01-22 (×7): qty 1

## 2017-01-22 MED ORDER — DIGOXIN 125 MCG PO TABS
62.5000 ug | ORAL_TABLET | Freq: Every day | ORAL | Status: DC
  Administered 2017-01-23 – 2017-01-24 (×2): 62.5 ug via ORAL
  Filled 2017-01-22 (×3): qty 1

## 2017-01-22 MED ORDER — ACETAMINOPHEN 325 MG PO TABS: 650.0000 mg | ORAL_TABLET | Freq: Four times a day (QID) | ORAL | Status: DC | PRN

## 2017-01-22 MED ORDER — PANTOPRAZOLE SODIUM 40 MG PO TBEC
40.0000 mg | DELAYED_RELEASE_TABLET | Freq: Every day | ORAL | Status: DC
  Administered 2017-01-23 – 2017-01-25 (×3): 40 mg via ORAL
  Filled 2017-01-22 (×3): qty 1

## 2017-01-22 MED ORDER — DONEPEZIL HCL 5 MG PO TABS
5.0000 mg | ORAL_TABLET | Freq: Every day | ORAL | Status: DC
  Administered 2017-01-23 – 2017-01-25 (×3): 5 mg via ORAL
  Filled 2017-01-22 (×3): qty 1

## 2017-01-22 MED ORDER — METFORMIN HCL 500 MG PO TABS: 500.0000 mg | ORAL_TABLET | Freq: Two times a day (BID) | ORAL | Status: DC

## 2017-01-22 MED ORDER — AMLODIPINE BESYLATE 5 MG PO TABS
7.5000 mg | ORAL_TABLET | Freq: Every day | ORAL | Status: DC
  Administered 2017-01-23 – 2017-01-24 (×2): 7.5 mg via ORAL
  Filled 2017-01-22 (×2): qty 1

## 2017-01-22 NOTE — ED Provider Notes (Signed)
[facility]Y DEPT Provider Note   CSN: [id number] Arrival date & time: 01/22/17  1301  By signing my name below, I, Thelma Barge, attest that this documentation has been prepared under the direction and in the presence of Verdie Mosher, Neysa Bonito, MD. Electronically Signed: Thelma Barge, Scribe. 01/22/17. 1:26 PM.  History   Chief Complaint Chief Complaint  Patient presents with  . Altered Mental Status  Level 5 Caveat due to dementia   The history is provided by the patient and the EMS personnel.  Altered Mental Status   This is a new problem. The current episode started less than 1 hour ago. The problem has not changed since onset.   HPI Comments: Mary Cook is a 83 y.o. female who presents to the Emergency Department s/p AMS that occurred prior to arrival. Per EMS, pt lost consciousness and had urinary incontinence. EMS also reports possible carbon monoxide gas present in the house. Pt lives at home by herself.  Past Medical History:  Diagnosis Date  . CAD (coronary artery disease)    a. 04/2010 MI DES x 2 to LCX;  b. 05/2014 Cath: EF 35%, patent LCX stents w/ 70% stenosis in small AV groove LCX, Diag 50-60, RCA 50-60p->Med Rx.  . Cerebral aneurysm   . Chronic combined systolic (congestive) and diastolic (congestive) heart failure (HCC)    a. 01/2015 Echo: EF 30-35%, sev inflat HK, Gr 1DD, mild AI.  Marland Kitchen Diverticulosis of colon   . Essential hypertension   . Frequent urination   . GIB (gastrointestinal bleeding)    a. 2002 Diverticular bleed.  . Hyperlipidemia   . Ischemic cardiomyopathy    a. 01/2015 Echo: EF 30-35%.  . Osteoarthritis   . Osteopenia   . Skin disorder   . Sleep apnea    cpap therapy  . Stroke (HCC) 2002  . T12 compression fracture (HCC)    a. 04/2015.  Marland Kitchen Thrombus   . Type II diabetes mellitus (HCC)   . Visual disorder     Patient Active Problem List   Diagnosis Date Noted  . Anemia of chronic disease 09/06/2016  . Gastroenteritis 07/22/2016  . Hip  pain, chronic 06/03/2016  . Dyspnea 11/15/2015  . Acute respiratory failure with hypoxia (HCC) 11/15/2015  . CHF exacerbation (HCC) 11/15/2015  . Pain in the chest   . Chronic systolic CHF (congestive heart failure) (HCC)   . Ischemic cardiomyopathy   . T12 compression fracture (HCC)   . Intertrigo 02/28/2015  . Actinic keratoses 02/28/2015  . Noncompliance with diet and medication regimen 02/28/2015  . LBP (low back pain) 02/13/2015  . Acute on chronic renal insufficiency 02/04/2015  . Cardiomyopathy, ischemic-EF 30-35% by echo 01/31/15 02/04/2015  . Chronic kidney disease stage III 02/03/2015  . Acute on chronic combined systolic and diastolic CHF, NYHA class 3 (HCC) 01/30/2015  . Cystitis 10/28/2014  . Insomnia 10/28/2014  . Alzheimer's disease 10/28/2014  . Falls frequently 10/12/2014  . Leg weakness, bilateral 10/12/2014  . OSA on CPAP 09/05/2014  . Rapid palpitations 06/07/2014  . Acute chest pain 06/06/2014  . Chest pain 06/06/2014  . Thoracic back pain after fall  04/13/2013  . Stress at home 01/26/2013  . GERD (gastroesophageal reflux disease) 01/25/2013  . Abnormal EKG, marked new ant TWI this adm with negative Troponins,08/18/11 08/20/2011  . CAD S/P CFX PCI Sept 2011 08/18/2011  . Sleep apnea, cpap had been stopped secondary to "sinus issues" 08/18/2011  . [name] deficiency 03/20/2010  . Diabetes mellitus (HCC)  01/07/2008  . Hyperlipidemia 01/07/2008  . Essential hypertension 04/25/2007  . DIVERTICULOSIS, COLON 04/25/2007  . OSTEOARTHRITIS 04/25/2007  . Disorder of bone and cartilage 04/25/2007  . DIVERTICULITIS, HX OF 04/25/2007    Past Surgical History:  Procedure Laterality Date  . ABDOMINAL HYSTERECTOMY  1980   no bso  . APPENDECTOMY    . CARDIAC CATHETERIZATION  07/2011   LAD OK, D1 80%, CFX 30% w/ patent stent, RCA 50-60%, EF 40%  . CARDIAC CATHETERIZATION    . CORONARY ANGIOPLASTY WITH STENT PLACEMENT  05/11/2010   stent CFX  . DOPPLER ECHOCARDIOGRAPHY     . LEFT HEART CATHETERIZATION WITH CORONARY ANGIOGRAM N/A 08/18/2011   Procedure: LEFT HEART CATHETERIZATION WITH CORONARY ANGIOGRAM;  Surgeon: Wellington Hampshire, MD;  Location: [facility] CATH LAB;  Service: Cardiovascular;  Laterality: N/A;  . LEFT HEART CATHETERIZATION WITH CORONARY ANGIOGRAM N/A 06/08/2014   Procedure: LEFT HEART CATHETERIZATION WITH CORONARY ANGIOGRAM;  Surgeon: Troy Sine, MD;  Location: [facility] CATH LAB;  Service: Cardiovascular;  Laterality: N/A;  . myocardial perfusion study    . THROMBECTOMY    . TONSILLECTOMY      OB History    No data available       Home Medications    Prior to Admission medications   Medication Sig Start Date End Date Taking? Authorizing Provider  amLODipine (NORVASC) 5 MG tablet Take 1.5 tablets (7.5 mg total) by mouth daily. 01/10/17 04/10/17 Yes Troy Sine, MD  aspirin 81 MG EC tablet Take 81 mg by mouth daily.     Yes [provider]  b complex vitamins tablet Take 1 tablet by mouth daily. 08/16/16  Yes Plotnikov, Evie Lacks, MD  Blood Glucose Monitoring Suppl (ACCU-CHEK AVIVA PLUS) w/Device KIT Use to check blood sugars twice a day Dx. E11.9 09/30/16  Yes Plotnikov, Evie Lacks, MD  carvedilol (COREG) 12.5 MG tablet Take 1/2 tablet twice a day 01/10/17  Yes Troy Sine, MD  citalopram (CELEXA) 20 MG tablet TAKE 1 TABLET EVERY DAY 12/12/16  Yes Plotnikov, Evie Lacks, MD  digoxin (LANOXIN) 0.125 MG tablet Take 0.5 tablets by mouth daily. 12/28/16  Yes [provider]  donepezil (ARICEPT) 5 MG tablet Take 1 tablet (5 mg total) by mouth daily. 12/12/16  Yes Plotnikov, Evie Lacks, MD  furosemide (LASIX) 40 MG tablet Take 0.5 tablets (20 mg total) by mouth daily. 12/06/16  Yes Troy Sine, MD  isosorbide mononitrate (IMDUR) 60 MG 24 hr tablet Take 1 tablet (60 mg total) by mouth daily. 12/06/16  Yes Troy Sine, MD  metFORMIN (GLUCOPHAGE) 500 MG tablet Take 1 tablet (500 mg total) by mouth daily with breakfast. Patient taking  differently: Take 500 mg by mouth 2 (two) times daily with a meal.  12/12/16  Yes Plotnikov, Evie Lacks, MD  omeprazole (PRILOSEC) 20 MG capsule Take 20 mg by mouth daily.   Yes [provider]  potassium chloride SA (KLOR-CON M20) 20 MEQ tablet Take 1 tablet (20 mEq total) by mouth daily. 12/30/16  Yes Plotnikov, Evie Lacks, MD  sacubitril-valsartan (ENTRESTO) 49-51 MG Take 1 tablet by mouth 2 (two) times daily. 12/12/16  Yes Plotnikov, Evie Lacks, MD    Family History Family History  Problem Relation Age of Onset  . Hypertension Other   . CAD Unknown        father    Social History Social History  Substance Use Topics  . Smoking status: Never Smoker  . Smokeless tobacco: Never Used  .  Alcohol use No     Allergies   Clopidogrel bisulfate; Lipitor [atorvastatin]; Namenda [memantine hcl]; and Lisinopril   Review of Systems Review of Systems  Unable to perform ROS: Mental status change  Genitourinary:       Positive for: urinary incontinence  Neurological: Positive for syncope.  All other systems reviewed and are negative.    Physical Exam Updated Vital Signs BP (!) 173/73   Pulse 60   Temp 97.8 F (36.6 C) (Oral)   Resp 17   Ht '5\' 5"'$  (1.651 m)   Wt 68.5 kg (151 lb)   SpO2 97%   BMI 25.13 kg/m   Physical Exam Physical Exam  Nursing note and vitals reviewed. Constitutional: Well developed, well nourished, non-toxic, and in no acute distress Head: Normocephalic and atraumatic.  Mouth/Throat: Oropharynx is clear and moist.  Neck: Cervical collar in place.  Cardiovascular: Normal rate and regular rhythm.   Pulmonary/Chest: Effort normal and breath sounds normal.  Abdominal: Soft. There is no tenderness. There is no rebound and no guarding.  Musculoskeletal: Normal range of motion.  Neurological: Alert, no facial droop, fluent speech, moves all extremities symmetrically. Oriented to self but not time or situation. Only partial to location. Obeys simple  commands. Full strengths in bilateral hand grip and bilateral ankle dorsiflexion and plantarflexion. Sensation to light touch intact throughout. Skin: Skin is warm and dry.  Psychiatric: Cooperative   ED Treatments / Results  DIAGNOSTIC STUDIES: Oxygen Saturation is 95% on RA, normal by my interpretation.    COORDINATION OF CARE: 1:25 PM Discussed treatment plan with pt at bedside and pt agreed to plan. Labs (all labs ordered are listed, but only abnormal results are displayed) Labs Reviewed  CBC WITH DIFFERENTIAL/PLATELET - Abnormal; Notable for the following:       Result Value   RBC 3.54 (*)    Hemoglobin 11.0 (*)    HCT 33.7 (*)    All other components within normal limits  BASIC METABOLIC PANEL - Abnormal; Notable for the following:    Glucose, Bld 149 (*)    BUN 21 (*)    Creatinine, Ser 1.76 (*)    GFR calc non Af Amer 25 (*)    GFR calc Af Amer 29 (*)    All other components within normal limits  URINALYSIS, ROUTINE W REFLEX MICROSCOPIC - Abnormal; Notable for the following:    APPearance CLOUDY (*)    Hgb urine dipstick SMALL (*)    Bacteria, UA FEW (*)    Squamous Epithelial / LPF 6-30 (*)    All other components within normal limits  HEPATIC FUNCTION PANEL - Abnormal; Notable for the following:    ALT 13 (*)    All other components within normal limits  COOXEMETRY PANEL - Abnormal; Notable for the following:    Carboxyhemoglobin 1.6 (*)    All other components within normal limits  CBG MONITORING, ED - Abnormal; Notable for the following:    Glucose-Capillary 145 (*)    All other components within normal limits  TROPONIN I  I-STAT CG4 LACTIC ACID, ED    EKG  EKG Interpretation  Date/Time:  Wednesday Jan 22 2017 13:11:11 EDT Ventricular Rate:  55 PR Interval:    QRS Duration: 138 QT Interval:  425 QTC Calculation: 407 R Axis:   -36 Text Interpretation:  Possible atrial arrhythmia Left bundle branch block similar to prior EKG  Confirmed by Brantley Stage  [phone]) on 01/22/2017 1:28:08 PM  Radiology Dg Chest 2 View  Result Date: 01/22/2017 CLINICAL DATA:  Initial evaluation for acute altered mental status, unresponsive. EXAM: CHEST  2 VIEW COMPARISON:  Prior CT from 06/19/2016. FINDINGS: Mild cardiomegaly, stable. Mediastinal silhouette within normal limits. Aortic atherosclerosis. Lungs mildly hypoinflated. No focal infiltrates. Mild perihilar vascular congestion like related hypoinflation. No pulmonary edema. No pleural effusion. No pneumothorax. No acute osseus abnormality. IMPRESSION: 1. No active cardiopulmonary disease. 2. Stable cardiomegaly without pulmonary edema. 3. Aortic atherosclerosis. Electronically Signed   By: Jeannine Boga M.D.   On: 01/22/2017 14:23   Ct Head Wo Contrast  Result Date: 01/22/2017 CLINICAL DATA:  Fall on ground. Minimally responsive. Initial encounter. EXAM: CT HEAD WITHOUT CONTRAST CT CERVICAL SPINE WITHOUT CONTRAST TECHNIQUE: Multidetector CT imaging of the head and cervical spine was performed following the standard protocol without intravenous contrast. Multiplanar CT image reconstructions of the cervical spine were also generated. COMPARISON:  08/01/2014 FINDINGS: CT HEAD FINDINGS Brain: Small volume subarachnoid hemorrhage in the quadrigeminal plate cistern on the right. Trace hemorrhage seen in the occipital horn of the right lateral ventricle and in the septum pellucidum (the latter measuring 5 mm in maximal dimension). Remote infarct in the right parietal and occipital lobe with ex vacuo right lateral ventricular enlargement. Remote lateral lenticulostriate distribution infarct on the right. Extensive chronic small vessel ischemic gliosis in the cerebral white matter. Probable remote lacunar infarct in the left thalamus. Small remote inferior left cerebellar infarct. Vascular: Atherosclerotic calcification. Skull: Negative for fracture Sinuses/Orbits: No acute finding.  Bilateral cataract resection.  Other: Critical Value/emergent results were called by telephone at the time of interpretation on 01/22/2017 at 2:42 pm to Dr. Brantley Stage , who verbally acknowledged these results. CT CERVICAL SPINE FINDINGS Alignment: No traumatic malalignment. Facet mediated anterolisthesis at C3-4 and C4-5, mild. Skull base and vertebrae: No acute fracture. Soft tissues and spinal canal: No prevertebral fluid or swelling. No visible canal hematoma. Disc levels: Diffuse degenerative facet spurring. C5-6 and C6-7 predominant disc degeneration. No suspected degenerative cord impingement. Upper chest: No acute finding IMPRESSION: 1. Small volume subarachnoid hemorrhage in the right quadrigeminal plate cistern. 2. Small volume hemorrhage in the occipital horn right lateral ventricle and septum pellucidum. 3. Negative for cervical spine fracture. 4. Extensive remote ischemic injury. Electronically Signed   By: Monte Fantasia M.D.   On: 01/22/2017 14:45   Ct Cervical Spine Wo Contrast  Result Date: 01/22/2017 CLINICAL DATA:  Fall on ground. Minimally responsive. Initial encounter. EXAM: CT HEAD WITHOUT CONTRAST CT CERVICAL SPINE WITHOUT CONTRAST TECHNIQUE: Multidetector CT imaging of the head and cervical spine was performed following the standard protocol without intravenous contrast. Multiplanar CT image reconstructions of the cervical spine were also generated. COMPARISON:  08/01/2014 FINDINGS: CT HEAD FINDINGS Brain: Small volume subarachnoid hemorrhage in the quadrigeminal plate cistern on the right. Trace hemorrhage seen in the occipital horn of the right lateral ventricle and in the septum pellucidum (the latter measuring 5 mm in maximal dimension). Remote infarct in the right parietal and occipital lobe with ex vacuo right lateral ventricular enlargement. Remote lateral lenticulostriate distribution infarct on the right. Extensive chronic small vessel ischemic gliosis in the cerebral white matter. Probable remote lacunar  infarct in the left thalamus. Small remote inferior left cerebellar infarct. Vascular: Atherosclerotic calcification. Skull: Negative for fracture Sinuses/Orbits: No acute finding.  Bilateral cataract resection. Other: Critical Value/emergent results were called by telephone at the time of interpretation on 01/22/2017 at 2:42 pm to Dr. Brantley Stage , who verbally  acknowledged these results. CT CERVICAL SPINE FINDINGS Alignment: No traumatic malalignment. Facet mediated anterolisthesis at C3-4 and C4-5, mild. Skull base and vertebrae: No acute fracture. Soft tissues and spinal canal: No prevertebral fluid or swelling. No visible canal hematoma. Disc levels: Diffuse degenerative facet spurring. C5-6 and C6-7 predominant disc degeneration. No suspected degenerative cord impingement. Upper chest: No acute finding IMPRESSION: 1. Small volume subarachnoid hemorrhage in the right quadrigeminal plate cistern. 2. Small volume hemorrhage in the occipital horn right lateral ventricle and septum pellucidum. 3. Negative for cervical spine fracture. 4. Extensive remote ischemic injury. Electronically Signed   By: Monte Fantasia M.D.   On: 01/22/2017 14:45    Procedures Procedures (including critical care time) CRITICAL CARE Performed by: Forde Dandy   Total critical care time: 45 minutes  Critical care time was exclusive of separately billable procedures and treating other patients.  Critical care was necessary to treat or prevent imminent or life-threatening deterioration.  Critical care was time spent personally by me on the following activities: development of treatment plan with patient and/or surrogate as well as nursing, discussions with consultants, evaluation of patient's response to treatment, examination of patient, obtaining history from patient or surrogate, ordering and performing treatments and interventions, ordering and review of laboratory studies, ordering and review of radiographic studies, pulse  oximetry and re-evaluation of patient's condition.  Medications Ordered in ED Medications - No data to display   Initial Impression / Assessment and Plan / ED Course  I have reviewed the triage vital signs and the nursing notes.  Pertinent labs & imaging results that were available during my care of the patient were reviewed by me and considered in my medical decision making (see chart for details).     Additional history also obtained from the patient's husband when he is arrived to the ED. He states that patient was in her usual state of health this morning and they went for a ride in the car. Upon returning home, patient was by herself in the restroom when he heard a yelp. He came to find her unconscious on the ground and she had hit her head on the bathroom cabinet. He did not notice any seizure activity, but patient with not responsive so he subsequently called EMS. Patient with history of Alzheimer's dementia, and husband does state that she has mild confusion at times. He states she is at her mental status baseline now in ED.  In ED she is at baseline MS. She is neuro in tact. No evidence of carbon monoxide poisoning, as initially suggested by EMS due to gas exposure. No evidence of major electrolyte or metabolic derangements or infection.   CT head visualized and reviewed with radiology. Evidence of traumatic SAH with mild interventricular involvement. I spoke with Dr. Saintclair Halsted from neurosurgery who did not recommend surgical management for this. Did recommend observation in the hospital with neurology consult. Did also speak with Dr. Cheral Marker from neurology, who did not feel patient needed to be on their primary service. Recommended admission to hospitalist service. Discussed with Dr. Marin Comment who will admit to [facility].   Final Clinical Impressions(s) / ED Diagnoses   Final diagnoses:  Traumatic subarachnoid hemorrhage with loss of consciousness of 30 minutes or less, initial encounter [facility])      New Prescriptions New Prescriptions   No medications on file   I personally performed the services described in this documentation, which was scribed in my presence. The recorded information has been reviewed and is  accurate.    Forde Dandy, MD 01/22/17 [phone]

## 2017-01-22 NOTE — ED Notes (Signed)
Pt oriented at this time.  following commands. Moving all extremities.

## 2017-01-22 NOTE — ED Notes (Signed)
Skin tear to right thumb cleaned and dressing applied.

## 2017-01-22 NOTE — H&P (Signed)
History and Physical    Mary Cook HCW:[id number] DOB: 06/11/1926 DOA: 01/22/2017  PCP: Cassandria Anger, MD  Patient coming from: Home.   Chief Complaint:  [place].   HPI: Mary Cook is an 83 y.o. female with hx of dementia, but generally high functional, ambulate with assistance from her husband, hx of HTN, DM2, CAD s/p stents, fell today and hit her head.  She did not lose consciousness.  CT of the head in the ER showed small subarachnoid hemorrhage in the right quadrigeminal plate cistern.and in the occipital horn right lateral ventricle and septum pellucidum with extensive remote ischemic injury.  Her neck CT showed no acute Fx.  Neurosurgery was consulted, and deemed non surgical.  [facility] was consulted for admission, and deferred to hospitalist admission.  Her Cr was found to be 1.7, and her platelet count was normal.  She has been on ASA, but not full anticoagulation.  She is a little more confused, but is mostly at her baseline at this time.    ED Course:  See above.  Rewiew of Systems:  Constitutional: Negative for malaise, fever and chills. No significant weight loss or weight gain Eyes: Negative for eye pain, redness and discharge, diplopia, visual changes, or flashes of light. ENMT: Negative for ear pain, hoarseness, nasal congestion, sinus pressure and sore throat. No headaches; tinnitus, drooling, or problem swallowing. Cardiovascular: Negative for chest pain, palpitations, diaphoresis, dyspnea and peripheral edema. ; No orthopnea, PND Respiratory: Negative for cough, hemoptysis, wheezing and stridor. No pleuritic chestpain. Gastrointestinal: Negative for diarrhea, constipation,  melena, blood in stool, hematemesis, jaundice and rectal bleeding.    Genitourinary: Negative for frequency, dysuria, incontinence,flank pain and hematuria; Musculoskeletal: Negative for back pain and neck pain. Negative for swelling and trauma.;  Skin: . Negative for  pruritus, rash, abrasions, bruising and skin lesion.; ulcerations Neuro: Negative for headache, lightheadedness and neck stiffness. Negative for weakness, altered level of consciousness , altered mental status, extremity weakness, burning feet, involuntary movement, seizure and syncope.  Psych: negative for anxiety, depression, insomnia, tearfulness, panic attacks, hallucinations, paranoia, suicidal or homicidal ideation    Past Medical History:  Diagnosis Date  . CAD (coronary artery disease)    a. 04/2010 MI DES x 2 to LCX;  b. 05/2014 Cath: EF 35%, patent LCX stents w/ 70% stenosis in small AV groove LCX, Diag 50-60, RCA 50-60p->Med Rx.  . Cerebral aneurysm   . Chronic combined systolic (congestive) and diastolic (congestive) heart failure ([place])    a. 01/2015 Echo: EF 30-35%, sev inflat HK, Gr 1DD, mild AI.  Marland Kitchen Diverticulosis of colon   . Essential hypertension   . Frequent urination   . GIB (gastrointestinal bleeding)    a. 2002 Diverticular bleed.  . Hyperlipidemia   . Ischemic cardiomyopathy    a. 01/2015 Echo: EF 30-35%.  . Osteoarthritis   . Osteopenia   . Skin disorder   . Sleep apnea    cpap therapy  . Stroke ([place]) 2002  . T12 compression fracture ([place])    a. 04/2015.  Marland Kitchen Thrombus   . Type II diabetes mellitus ([place])   . Visual disorder     Past Surgical History:  Procedure Laterality Date  . ABDOMINAL HYSTERECTOMY  1980   no bso  . APPENDECTOMY    . CARDIAC CATHETERIZATION  07/2011   LAD OK, D1 80%, CFX 30% w/ patent stent, RCA 50-60%, EF 40%  . CARDIAC CATHETERIZATION    . CORONARY ANGIOPLASTY WITH STENT  PLACEMENT  05/11/2010   stent CFX  . DOPPLER ECHOCARDIOGRAPHY    . LEFT HEART CATHETERIZATION WITH CORONARY ANGIOGRAM N/A 08/18/2011   Procedure: LEFT HEART CATHETERIZATION WITH CORONARY ANGIOGRAM;  Surgeon: Wellington Hampshire, MD;  Location: [facility] CATH LAB;  Service: Cardiovascular;  Laterality: N/A;  . LEFT HEART CATHETERIZATION WITH CORONARY ANGIOGRAM N/A 06/08/2014    Procedure: LEFT HEART CATHETERIZATION WITH CORONARY ANGIOGRAM;  Surgeon: Troy Sine, MD;  Location: [facility] CATH LAB;  Service: Cardiovascular;  Laterality: N/A;  . myocardial perfusion study    . THROMBECTOMY    . TONSILLECTOMY       reports that she has never smoked. She has never used smokeless tobacco. She reports that she does not drink alcohol or use drugs.  Allergies  Allergen Reactions  . Clopidogrel Bisulfate Nausea And Vomiting    Patient is not aware of this allergy  . Lipitor [Atorvastatin] Other (See Comments)    Weak legs  . Namenda [Memantine Hcl]     N/v/d  . Lisinopril Cough    Patient is not aware of this allergy    Family History  Problem Relation Age of Onset  . Hypertension Other   . CAD Unknown        father     Prior to Admission medications   Medication Sig Start Date End Date Taking? Authorizing Provider  amLODipine (NORVASC) 5 MG tablet Take 1.5 tablets (7.5 mg total) by mouth daily. 01/10/17 04/10/17 Yes Troy Sine, MD  aspirin 81 MG EC tablet Take 81 mg by mouth daily.     Yes [provider]  b complex vitamins tablet Take 1 tablet by mouth daily. 08/16/16  Yes Plotnikov, Evie Lacks, MD  Blood Glucose Monitoring Suppl (ACCU-CHEK AVIVA PLUS) w/Device KIT Use to check blood sugars twice a day Dx. E11.9 09/30/16  Yes Plotnikov, Evie Lacks, MD  carvedilol (COREG) 12.5 MG tablet Take 1/2 tablet twice a day 01/10/17  Yes Troy Sine, MD  citalopram (CELEXA) 20 MG tablet TAKE 1 TABLET EVERY DAY 12/12/16  Yes Plotnikov, Evie Lacks, MD  digoxin (LANOXIN) 0.125 MG tablet Take 0.5 tablets by mouth daily. 12/28/16  Yes [provider]  donepezil (ARICEPT) 5 MG tablet Take 1 tablet (5 mg total) by mouth daily. 12/12/16  Yes Plotnikov, Evie Lacks, MD  furosemide (LASIX) 40 MG tablet Take 0.5 tablets (20 mg total) by mouth daily. 12/06/16  Yes Troy Sine, MD  isosorbide mononitrate (IMDUR) 60 MG 24 hr tablet Take 1 tablet (60 mg total) by mouth  daily. 12/06/16  Yes Troy Sine, MD  metFORMIN (GLUCOPHAGE) 500 MG tablet Take 1 tablet (500 mg total) by mouth daily with breakfast. Patient taking differently: Take 500 mg by mouth 2 (two) times daily with a meal.  12/12/16  Yes Plotnikov, Evie Lacks, MD  omeprazole (PRILOSEC) 20 MG capsule Take 20 mg by mouth daily.   Yes [provider]  potassium chloride SA (KLOR-CON M20) 20 MEQ tablet Take 1 tablet (20 mEq total) by mouth daily. 12/30/16  Yes Plotnikov, Evie Lacks, MD  sacubitril-valsartan (ENTRESTO) 49-51 MG Take 1 tablet by mouth 2 (two) times daily. 12/12/16  Yes Plotnikov, Evie Lacks, MD    Physical Exam: Vitals:   01/22/17 1500 01/22/17 1619 01/22/17 1620 01/22/17 1630  BP: (!) 154/57 (!) 145/63  (!) 173/73  Pulse: (!) 55  (!) 59 60  Resp: _0 Temp:      TempSrc:  SpO2: 100%  98% 97%  Weight:      Height:          Constitutional: NAD, calm, comfortable Vitals:   01/22/17 1500 01/22/17 1619 01/22/17 1620 01/22/17 1630  BP: (!) 154/57 (!) 145/63  (!) 173/73  Pulse: (!) 55  (!) 59 60  Resp: _0 Temp:      TempSrc:      SpO2: 100%  98% 97%  Weight:      Height:       Eyes: PERRL, lids and conjunctivae normal ENMT: Mucous membranes are moist. Posterior pharynx clear of any exudate or lesions.Normal dentition.  Neck: normal, supple, no masses, no thyromegaly Respiratory: clear to auscultation bilaterally, no wheezing, no crackles. Normal respiratory effort. No accessory muscle use.  Cardiovascular: Regular rate and rhythm, no murmurs / rubs / gallops. No extremity edema. 2+ pedal pulses. No carotid bruits.  Abdomen: no tenderness, no masses palpated. No hepatosplenomegaly. Bowel sounds positive.  Musculoskeletal: no clubbing / cyanosis. No joint deformity upper and lower extremities. Good ROM, no contractures. Normal muscle tone.  Skin: no rashes, lesions, ulcers. No induration Neurologic: CN 2-12 grossly intact. Sensation intact, DTR normal.  Strength 5/5 in all 4.  Psychiatric: Normal judgment and insight. Alert and oriented x 3. Normal mood.   Labs on Admission: I have personally reviewed following labs and imaging studies CBC:  Recent Labs Lab 01/22/17 1331  WBC 6.1  NEUTROABS 4.0  HGB 11.0*  HCT 33.7*  MCV 95.2  PLT 169   Basic Metabolic Panel:  Recent Labs Lab 01/22/17 1331  NA 138  K 4.2  CL 102  CO2 29  GLUCOSE 149*  BUN 21*  CREATININE 1.76*  CALCIUM 9.4   GFR: Estimated Creatinine Clearance: 21.5 mL/min (A) (by C-G formula based on SCr of 1.76 mg/dL (H)). Liver Function Tests:  Recent Labs Lab 01/22/17 1331  AST 17  ALT 13*  ALKPHOS 60  BILITOT 0.6  PROT 7.2  ALBUMIN 3.7   Cardiac Enzymes:  Recent Labs Lab 01/22/17 1331  TROPONINI <0.03   BNP (last 3 results)  Recent Labs  05/08/16 1700  PROBNP 922.00*   CBG:  Recent Labs Lab 01/22/17 1328  GLUCAP 145*   Urine analysis:    Component Value Date/Time   COLORURINE YELLOW 01/22/2017 1307   APPEARANCEUR CLOUDY (A) 01/22/2017 1307   LABSPEC 1.008 01/22/2017 1307   PHURINE 7.0 01/22/2017 1307   GLUCOSEU NEGATIVE 01/22/2017 1307   GLUCOSEU NEGATIVE 10/12/2014 1053   HGBUR SMALL (A) 01/22/2017 1307   BILIRUBINUR NEGATIVE 01/22/2017 1307   KETONESUR NEGATIVE 01/22/2017 1307   PROTEINUR NEGATIVE 01/22/2017 1307   UROBILINOGEN 1.0 04/28/2015 1658   NITRITE NEGATIVE 01/22/2017 1307   LEUKOCYTESUR NEGATIVE 01/22/2017 1307     Radiological Exams on Admission: Dg Chest 2 View  Result Date: 01/22/2017 CLINICAL DATA:  Initial evaluation for acute altered mental status, unresponsive. EXAM: CHEST  2 VIEW COMPARISON:  Prior CT from 06/19/2016. FINDINGS: Mild cardiomegaly, stable. Mediastinal silhouette within normal limits. Aortic atherosclerosis. Lungs mildly hypoinflated. No focal infiltrates. Mild perihilar vascular congestion like related hypoinflation. No pulmonary edema. No pleural effusion. No pneumothorax. No acute osseus  abnormality. IMPRESSION: 1. No active cardiopulmonary disease. 2. Stable cardiomegaly without pulmonary edema. 3. Aortic atherosclerosis. Electronically Signed   By: Jeannine Boga M.D.   On: 01/22/2017 14:23   Ct Head Wo Contrast  Result Date: 01/22/2017 CLINICAL DATA:  Fall on ground. Minimally responsive. Initial encounter. EXAM:  CT HEAD WITHOUT CONTRAST CT CERVICAL SPINE WITHOUT CONTRAST TECHNIQUE: Multidetector CT imaging of the head and cervical spine was performed following the standard protocol without intravenous contrast. Multiplanar CT image reconstructions of the cervical spine were also generated. COMPARISON:  08/01/2014 FINDINGS: CT HEAD FINDINGS Brain: Small volume subarachnoid hemorrhage in the quadrigeminal plate cistern on the right. Trace hemorrhage seen in the occipital horn of the right lateral ventricle and in the septum pellucidum (the latter measuring 5 mm in maximal dimension). Remote infarct in the right parietal and occipital lobe with ex vacuo right lateral ventricular enlargement. Remote lateral lenticulostriate distribution infarct on the right. Extensive chronic small vessel ischemic gliosis in the cerebral white matter. Probable remote lacunar infarct in the left thalamus. Small remote inferior left cerebellar infarct. Vascular: Atherosclerotic calcification. Skull: Negative for fracture Sinuses/Orbits: No acute finding.  Bilateral cataract resection. Other: Critical Value/emergent results were called by telephone at the time of interpretation on 01/22/2017 at 2:42 pm to Dr. Brantley Stage , who verbally acknowledged these results. CT CERVICAL SPINE FINDINGS Alignment: No traumatic malalignment. Facet mediated anterolisthesis at C3-4 and C4-5, mild. Skull base and vertebrae: No acute fracture. Soft tissues and spinal canal: No prevertebral fluid or swelling. No visible canal hematoma. Disc levels: Diffuse degenerative facet spurring. C5-6 and C6-7 predominant disc degeneration. No  suspected degenerative cord impingement. Upper chest: No acute finding IMPRESSION: 1. Small volume subarachnoid hemorrhage in the right quadrigeminal plate cistern. 2. Small volume hemorrhage in the occipital horn right lateral ventricle and septum pellucidum. 3. Negative for cervical spine fracture. 4. Extensive remote ischemic injury. Electronically Signed   By: Monte Fantasia M.D.   On: 01/22/2017 14:45   Ct Cervical Spine Wo Contrast  Result Date: 01/22/2017 CLINICAL DATA:  Fall on ground. Minimally responsive. Initial encounter. EXAM: CT HEAD WITHOUT CONTRAST CT CERVICAL SPINE WITHOUT CONTRAST TECHNIQUE: Multidetector CT imaging of the head and cervical spine was performed following the standard protocol without intravenous contrast. Multiplanar CT image reconstructions of the cervical spine were also generated. COMPARISON:  08/01/2014 FINDINGS: CT HEAD FINDINGS Brain: Small volume subarachnoid hemorrhage in the quadrigeminal plate cistern on the right. Trace hemorrhage seen in the occipital horn of the right lateral ventricle and in the septum pellucidum (the latter measuring 5 mm in maximal dimension). Remote infarct in the right parietal and occipital lobe with ex vacuo right lateral ventricular enlargement. Remote lateral lenticulostriate distribution infarct on the right. Extensive chronic small vessel ischemic gliosis in the cerebral white matter. Probable remote lacunar infarct in the left thalamus. Small remote inferior left cerebellar infarct. Vascular: Atherosclerotic calcification. Skull: Negative for fracture Sinuses/Orbits: No acute finding.  Bilateral cataract resection. Other: Critical Value/emergent results were called by telephone at the time of interpretation on 01/22/2017 at 2:42 pm to Dr. Brantley Stage , who verbally acknowledged these results. CT CERVICAL SPINE FINDINGS Alignment: No traumatic malalignment. Facet mediated anterolisthesis at C3-4 and C4-5, mild. Skull base and vertebrae: No  acute fracture. Soft tissues and spinal canal: No prevertebral fluid or swelling. No visible canal hematoma. Disc levels: Diffuse degenerative facet spurring. C5-6 and C6-7 predominant disc degeneration. No suspected degenerative cord impingement. Upper chest: No acute finding IMPRESSION: 1. Small volume subarachnoid hemorrhage in the right quadrigeminal plate cistern. 2. Small volume hemorrhage in the occipital horn right lateral ventricle and septum pellucidum. 3. Negative for cervical spine fracture. 4. Extensive remote ischemic injury. Electronically Signed   By: Monte Fantasia M.D.   On: 01/22/2017 14:45    EKG:  Independently reviewed.  Assessment/Plan Active Problems:   Diabetes mellitus ([place])   Essential hypertension   Sleep apnea, cpap had been stopped secondary to "sinus issues"   Falls frequently   Chronic kidney disease stage III   Cardiomyopathy, ischemic-EF 30-35% by echo 04/01/56   Chronic systolic CHF (congestive heart failure) (H[facility])   ICH (intracerebral hemorrhage) ([facility])    PLAN:   Traumatic subarachnoid hemorrhage:  Will admit to SDU at [facility] for closer monitoring.  Per Neurosurgery, no acute intervention is required at this time.  Neurology has been consulted (Dr Loel Dubonnet) and will follow up.  Hold ASA, lovenox or SubQ heparin and the like.  CKD:  Stable.  Follow Cr.  Ischemic CMP:  Stable and compensated.  Continue with meds.   DVT prophylaxis: SCD.  Code Status: DNR>   Discussed with husband and confirmed.  Family Communication: husband at bedside.  Disposition Plan: home when able.  Consults called: Neurosurgery, neurology.  Admission status: OBS>   Kimerly Rowand MD FACP. [facility]  If 7PM-7AM, please contact night-coverage [email or web address] Password [email or web address]  01/22/2017, 5:29 PM

## 2017-01-22 NOTE — ED Notes (Signed)
Called floor 4E [phone] no answered.
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
## 2017-01-22 NOTE — ED Notes (Signed)
Report given to [facility].

## 2017-01-23 DIAGNOSIS — N183 Chronic kidney disease, stage 3 (moderate): Secondary | ICD-10-CM

## 2017-01-23 DIAGNOSIS — S066X1A Traumatic subarachnoid hemorrhage with loss of consciousness of 30 minutes or less, initial encounter: Secondary | ICD-10-CM

## 2017-01-23 DIAGNOSIS — E0800 Diabetes mellitus due to underlying condition with hyperosmolarity without nonketotic hyperglycemic-hyperosmolar coma (NKHHC): Secondary | ICD-10-CM

## 2017-01-23 DIAGNOSIS — I255 Ischemic cardiomyopathy: Secondary | ICD-10-CM

## 2017-01-23 LAB — GLUCOSE, CAPILLARY
GLUCOSE-CAPILLARY: 181 mg/dL — AB (ref 65–99)
GLUCOSE-CAPILLARY: 220 mg/dL — AB (ref 65–99)
Glucose-Capillary: 181 mg/dL — ABNORMAL HIGH (ref 65–99)

## 2017-01-23 LAB — MRSA PCR SCREENING: MRSA BY PCR: NEGATIVE

## 2017-01-23 MED ORDER — INSULIN ASPART 100 UNIT/ML ~~LOC~~ SOLN
0.0000 [IU] | Freq: Three times a day (TID) | SUBCUTANEOUS | Status: DC
  Administered 2017-01-23 – 2017-01-24 (×3): 2 [IU] via SUBCUTANEOUS
  Administered 2017-01-24: 3 [IU] via SUBCUTANEOUS
  Administered 2017-01-24: 1 [IU] via SUBCUTANEOUS
  Administered 2017-01-25 (×2): 2 [IU] via SUBCUTANEOUS

## 2017-01-23 MED ORDER — INSULIN ASPART 100 UNIT/ML ~~LOC~~ SOLN
0.0000 [IU] | Freq: Every day | SUBCUTANEOUS | Status: DC
  Administered 2017-01-23: 2 [IU] via SUBCUTANEOUS

## 2017-01-23 NOTE — Progress Notes (Signed)
@IPLOG         PROGRESS NOTE                                                                                                                                                                                                             Patient Demographics:    Mary Cook, is a 83 y.o. female, DOB - 1926/02/15, JJH:[id number]  Admit date - 01/22/2017   Admitting Physician Mary Falconer, MD  Outpatient Primary MD for the patient is Plotnikov, Evie Lacks, MD  LOS - 0  Chief Complaint  Patient presents with  . Altered Mental Status       Brief Narrative  Mary Cook is an 83 y.o. female with hx of dementia, but generally high functional, ambulate with assistance from her husband, hx of HTN, DM2, CAD s/p stents, fell today and hit her head.  She did not lose consciousness.  CT of the head in the ER showed small subarachnoid hemorrhage in the right quadrigeminal plate cistern.and in the occipital horn right lateral ventricle and septum pellucidum with extensive remote ischemic injury.  Her neck CT showed no acute Fx.  Neurosurgery was consulted, and deemed non surgical.  [facility] service was consulted for admission, and deferred to hospitalist admission.  Her Cr was found to be 1.7, and her platelet count was normal.  She has been on ASA, but not full anticoagulation.        Subjective:    [name] today has, No headache, No chest pain, No abdominal pain - No Nausea, No new weakness tingling or numbness, No Cough - SOB.     Assessment  & Plan :    Active Problems:   Diabetes mellitus ([place])   Essential hypertension   Sleep apnea, cpap had been stopped secondary to "sinus issues"   Falls frequently   Chronic kidney disease stage III   Cardiomyopathy, ischemic-EF 30-35% by echo 03/26/84   Chronic systolic CHF (congestive heart failure) ([facility])   ICH (intracerebral hemorrhage) ([place])   1.Mechanical fall with traumatic subarachnoid hemorrhage. Neurosurgery was called to  deferred admission to neurology, neurology deferred admission to hospitalist service, patient and the hospitalist service I have consulted neurology this morning who will see the patient in consult. Will request them to comment on the need of pneumonia pain, blood pressure goal criteria, will commence PT, likely home health PT tomorrow. Mentation stable no focal deficits.  2. CK D3. Baseline creatinine close to 1.8. At baseline.  3. Chronic systolic CHF EF around 63-14%. Currently  on combination of Coreg, Imdur, Entresto and Lasix which will be continued. Clinically compensated.  4. Hypertension. Stable on combination of Norvasc, Coreg, Imdur, Entresto and diuretic.  5. CAD. No acute issues aspirin on hold due to #1 above, continue beta blocker and Imdur.  6. GERD. On PPI.  7. DM type II. We'll place on sliding scale.  CBG (last 3)   Recent Labs  01/22/17 1328  GLUCAP 145*      Diet : Diet regular Room service appropriate? Yes; Fluid consistency: Thin    Family Communication  :  Husband  Code Status :  DNR  Disposition Plan  :  TBD  Consults  :  Called Neurosurgery, trauma and neurology  Procedures  :  CT head. Small subarachnoid bleed  DVT Prophylaxis  :    SCDs    Lab Results  Component Value Date   PLT 179 01/22/2017    Inpatient Medications  Scheduled Meds: . amLODipine  7.5 mg Oral Daily  . carvedilol  12.5 mg Oral BID WC  . citalopram  20 mg Oral Daily  . digoxin  62.5 mcg Oral Daily  . donepezil  5 mg Oral Daily  . furosemide  20 mg Oral Daily  . isosorbide mononitrate  60 mg Oral Daily  . pantoprazole  40 mg Oral Daily  . potassium chloride SA  20 mEq Oral Daily  . sacubitril-valsartan  1 tablet Oral BID   Continuous Infusions: PRN Meds:.acetaminophen **OR** acetaminophen  Antibiotics  :    Anti-infectives    None         Objective:   Vitals:   01/22/17 2303 01/23/17 0358 01/23/17 0505 01/23/17 0844  BP: (!) 155/81 (!) 168/73 (!) 157/66    Pulse: 68 61 61   Resp: (!) 24 18 10    Temp: 98.3 F (36.8 C) 98.5 F (36.9 C)  97.4 F (36.3 C)  TempSrc: Oral Oral  Oral  SpO2: 94% 94% 94%   Weight:      Height:        Wt Readings from Last 3 Encounters:  01/22/17 69.4 kg (153 lb)  01/10/17 68.5 kg (151 lb)  12/12/16 69.9 kg (154 lb 1.3 oz)     Intake/Output Summary (Last 24 hours) at 01/23/17 0850 Last data filed at 01/22/17 2126  Gross per 24 hour  Intake                0 ml  Output              100 ml  Net             -100 ml     Physical Exam  Awake Alert, Oriented X 3, No new F.N deficits, Normal affect [place].AT,PERRAL, small R forehead bruise Supple Neck,No JVD, No cervical lymphadenopathy appriciated.  Symmetrical Chest wall movement, Good air movement bilaterally, CTAB RRR,No Gallops,Rubs or new Murmurs, No Parasternal Heave +ve B.Sounds, Abd Soft, No tenderness, No organomegaly appriciated, No rebound - guarding or rigidity. No Cyanosis, Clubbing or edema, No new Rash or bruise       Data Review:    CBC  Recent Labs Lab 01/22/17 1331  WBC 6.1  HGB 11.0*  HCT 33.7*  PLT 179  MCV 95.2  MCH 31.1  MCHC 32.6  RDW 12.7  LYMPHSABS 1.5  MONOABS 0.5  EOSABS 0.1  BASOSABS 0.0    Chemistries   Recent Labs Lab 01/22/17 1331  NA 138  K 4.2  CL  102  CO2 29  GLUCOSE 149*  BUN 21*  CREATININE 1.76*  CALCIUM 9.4  AST 17  ALT 13*  ALKPHOS 60  BILITOT 0.6   ------------------------------------------------------------------------------------------------------------------ No results for input(s): CHOL, HDL, LDLCALC, TRIG, CHOLHDL, LDLDIRECT in the last 72 hours.  Lab Results  Component Value Date   HGBA1C 6.6 (H) 12/12/2016   ------------------------------------------------------------------------------------------------------------------ No results for input(s): TSH, T4TOTAL, T3FREE, THYROIDAB in the last 72 hours.  Invalid input(s):  FREET3 ------------------------------------------------------------------------------------------------------------------ No results for input(s): VITAMINB12, FOLATE, FERRITIN, TIBC, IRON, RETICCTPCT in the last 72 hours.  Coagulation profile No results for input(s): INR, PROTIME in the last 168 hours.  No results for input(s): DDIMER in the last 72 hours.  Cardiac Enzymes  Recent Labs Lab 01/22/17 1331  TROPONINI <0.03   ------------------------------------------------------------------------------------------------------------------    Component Value Date/Time   BNP 174.8 (H) 08/13/2016 1131    Micro Results Recent Results (from the past 240 hour(s))  MRSA PCR Screening     Status: None   Collection Time: 01/23/17 12:00 AM  Result Value Ref Range Status   MRSA by PCR NEGATIVE NEGATIVE Final    Radiology Reports Dg Chest 2 View  Result Date: 01/22/2017 CLINICAL DATA:  Initial evaluation for acute altered mental status, unresponsive. EXAM: CHEST  2 VIEW COMPARISON:  Prior CT from 06/19/2016. FINDINGS: Mild cardiomegaly, stable. Mediastinal silhouette within normal limits. Aortic atherosclerosis. Lungs mildly hypoinflated. No focal infiltrates. Mild perihilar vascular congestion like related hypoinflation. No pulmonary edema. No pleural effusion. No pneumothorax. No acute osseus abnormality. IMPRESSION: 1. No active cardiopulmonary disease. 2. Stable cardiomegaly without pulmonary edema. 3. Aortic atherosclerosis. Electronically Signed   By: Jeannine Boga M.D.   On: 01/22/2017 14:23   Ct Head Wo Contrast  Result Date: 01/22/2017 CLINICAL DATA:  Fall on ground. Minimally responsive. Initial encounter. EXAM: CT HEAD WITHOUT CONTRAST CT CERVICAL SPINE WITHOUT CONTRAST TECHNIQUE: Multidetector CT imaging of the head and cervical spine was performed following the standard protocol without intravenous contrast. Multiplanar CT image reconstructions of the cervical spine were also  generated. COMPARISON:  08/01/2014 FINDINGS: CT HEAD FINDINGS Brain: Small volume subarachnoid hemorrhage in the quadrigeminal plate cistern on the right. Trace hemorrhage seen in the occipital horn of the right lateral ventricle and in the septum pellucidum (the latter measuring 5 mm in maximal dimension). Remote infarct in the right parietal and occipital lobe with ex vacuo right lateral ventricular enlargement. Remote lateral lenticulostriate distribution infarct on the right. Extensive chronic small vessel ischemic gliosis in the cerebral white matter. Probable remote lacunar infarct in the left thalamus. Small remote inferior left cerebellar infarct. Vascular: Atherosclerotic calcification. Skull: Negative for fracture Sinuses/Orbits: No acute finding.  Bilateral cataract resection. Other: Critical Value/emergent results were called by telephone at the time of interpretation on 01/22/2017 at 2:42 pm to Dr. Brantley Stage , who verbally acknowledged these results. CT CERVICAL SPINE FINDINGS Alignment: No traumatic malalignment. Facet mediated anterolisthesis at C3-4 and C4-5, mild. Skull base and vertebrae: No acute fracture. Soft tissues and spinal canal: No prevertebral fluid or swelling. No visible canal hematoma. Disc levels: Diffuse degenerative facet spurring. C5-6 and C6-7 predominant disc degeneration. No suspected degenerative cord impingement. Upper chest: No acute finding IMPRESSION: 1. Small volume subarachnoid hemorrhage in the right quadrigeminal plate cistern. 2. Small volume hemorrhage in the occipital horn right lateral ventricle and septum pellucidum. 3. Negative for cervical spine fracture. 4. Extensive remote ischemic injury. Electronically Signed   By: Monte Fantasia M.D.   On: 01/22/2017 14:45  Ct Cervical Spine Wo Contrast  Result Date: 01/22/2017 CLINICAL DATA:  Fall on ground. Minimally responsive. Initial encounter. EXAM: CT HEAD WITHOUT CONTRAST CT CERVICAL SPINE WITHOUT CONTRAST  TECHNIQUE: Multidetector CT imaging of the head and cervical spine was performed following the standard protocol without intravenous contrast. Multiplanar CT image reconstructions of the cervical spine were also generated. COMPARISON:  08/01/2014 FINDINGS: CT HEAD FINDINGS Brain: Small volume subarachnoid hemorrhage in the quadrigeminal plate cistern on the right. Trace hemorrhage seen in the occipital horn of the right lateral ventricle and in the septum pellucidum (the latter measuring 5 mm in maximal dimension). Remote infarct in the right parietal and occipital lobe with ex vacuo right lateral ventricular enlargement. Remote lateral lenticulostriate distribution infarct on the right. Extensive chronic small vessel ischemic gliosis in the cerebral white matter. Probable remote lacunar infarct in the left thalamus. Small remote inferior left cerebellar infarct. Vascular: Atherosclerotic calcification. Skull: Negative for fracture Sinuses/Orbits: No acute finding.  Bilateral cataract resection. Other: Critical Value/emergent results were called by telephone at the time of interpretation on 01/22/2017 at 2:42 pm to Dr. Brantley Stage , who verbally acknowledged these results. CT CERVICAL SPINE FINDINGS Alignment: No traumatic malalignment. Facet mediated anterolisthesis at C3-4 and C4-5, mild. Skull base and vertebrae: No acute fracture. Soft tissues and spinal canal: No prevertebral fluid or swelling. No visible canal hematoma. Disc levels: Diffuse degenerative facet spurring. C5-6 and C6-7 predominant disc degeneration. No suspected degenerative cord impingement. Upper chest: No acute finding IMPRESSION: 1. Small volume subarachnoid hemorrhage in the right quadrigeminal plate cistern. 2. Small volume hemorrhage in the occipital horn right lateral ventricle and septum pellucidum. 3. Negative for cervical spine fracture. 4. Extensive remote ischemic injury. Electronically Signed   By: Monte Fantasia M.D.   On: 01/22/2017  14:45    Time Spent in minutes  30   Mary Cook M.D on 01/23/2017 at 8:50 AM  Between 7am to 7pm - Pager - [phone] ( page via [email or web address], text pages only, please mention full 10 digit call back number). After 7pm go to [email or web address] - password [email or web address]

## 2017-01-23 NOTE — Progress Notes (Signed)
STROKE TEAM PROGRESS NOTE   SUBJECTIVE (INTERVAL HISTORY)  Patient is accompanied by her husband at bedside. He provides history that both of them a walking when his wife fell suddenly and hit the side of her head onto the wall and was unresponsive for a brief period. She denied any preceding symptoms like headache or passing out This is apparently the third of 4 time that she is fallen and struck her head.  OBJECTIVE Temp:  [97.6 F (36.4 C)-99.4 F (37.4 C)] 98.5 F (36.9 C) (06/01 0823) Pulse Rate:  [53-60] 56 (06/01 0824) Cardiac Rhythm: Heart block (06/01 0820) Resp:  [14-22] 18 (06/01 0824) BP: (102-145)/(51-86) 145/56 (06/01 0824) SpO2:  [90 %-98 %] 94 % (06/01 0824)  CBC:   Recent Labs Lab 01/22/17 1331  WBC 6.1  NEUTROABS 4.0  HGB 11.0*  HCT 33.7*  MCV 95.2  PLT 941    Basic Metabolic Panel:   Recent Labs Lab 01/22/17 1331  NA 138  K 4.2  CL 102  CO2 29  GLUCOSE 149*  BUN 21*  CREATININE 1.76*  CALCIUM 9.4    Lipid Panel:     Component Value Date/Time   CHOL 230 (H) 11/16/2015 0240   TRIG 97 11/16/2015 0240   HDL 68 11/16/2015 0240   CHOLHDL 3.4 11/16/2015 0240   VLDL 19 11/16/2015 0240   LDLCALC 143 (H) 11/16/2015 0240    PHYSICAL EXAM Frail elderly Caucasian lady currently not in distress. . Afebrile. Head is nontraumatic. Neck is supple without bruit.    Cardiac exam no murmur or gallop. Lungs are clear to auscultation. Distal pulses are well felt. Right temporal ecchymosis Neurological Exam ;  Awake  Alert oriented to person only. Diminished attention, registration and recall. Poor insight into her condition. Easy distractibility. Normal speech and language.eye movements full without nystagmus.fundi were not visualized. Vision acuity and fields appear normal. Hearing is normal. Palatal movements are normal. Face symmetric. Tongue midline. Normal strength, tone, reflexes and coordination. Normal sensation. Gait deferred.  ASSESSMENT/PLAN [name] is a 83 y.o. female with history of HTN, DM, CAD, aneurysm and fall today where she hit her head, found incontinent in setting of carbon monoxide. CT shows  small volume subarachnoid hemorrhage in the right quadrigeminal plate cistern; Small volume hemorrhage in the occipital horn right lateral ventricle and septum pellucidum.   Traumatic SAH  NS consult. Non-surgical  Resultant  no focal deficits  CT head 5/30 1445 small R quadrigeminal plate SAH with hemorrhage in R occipital horn and septum pellucidum. Extensive remote ischemic injury  CT spine no fx  MRA head pending   SCDs for VTE prophylaxis Diet regular Room service appropriate? 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 Followed by Neurologist: Dr. Salena Cook at [facility]. Last seen 04/2002. On Aggrenox and niacin at that time.  Hx Cerebral aneurysm per  Coronary artery disease s/p stents  Obstructive sleep apnea, stopped CPAP d/t sinus issues  Chronic combined systolic and diastolic CHF  Other Active Problems  Hx dementia on aricept  CKD stage III  GERD  Hospital day # 0  I have personally examined this patient, reviewed notes, independently viewed imaging studies, participated in medical decision making and plan of care.ROS completed by me personally and pertinent positives fully documented  I have made any  additions or clarifications directly to the above note. She has a history of recurrent falls recently and presented with a fall followed by small subarachnoid hemorrhage in the right occipital horn and basal cisterns possibly posttraumatic. Doubt this is related to aneurysm. Recommend repeat CT scan of the head and MRA of the brain remains blood vessels. Long discussion of the bedside with the patient and husband and answered questions. Discussed with Dr. Candiss Norse. Greater than 50% time during this 35 minute visit was spent on counseling and coordination of care about her traumatic subarachnoid hemorrhage, evaluation planned discussion and answering questions  Antony Contras, MD Medical Director [place] Pager: [phone] 01/24/2017 2:51 PM   To contact Stroke Continuity provider, please refer to [email or web address] After hours, contact General Neurology

## 2017-01-23 NOTE — Consult Note (Signed)
NEURO HOSPITALIST CONSULT NOTE   Requesting physician: Dr. Candiss Norse  Reason for Consult:Traumatic subarachnoid hemorrhage  History obtained from:  Patient and Chart  HPI:                                                                                                                                          Mary Cook is an 83 y.o. female who presented to [facility] Emergency Department yesterday afternoon following a fall at her home. Patient's husband reports that he found her unconscious on the bathroom floor. After rousing her, he noted she was altered and called EMS. He denies seizure activity. He stated that this is the third or fourth time she's fallen and struck her head.   On my visit, Mary Cook was resting comfortably in bed. She denies headache or pain at this time. Endorsed some room-spinning dizziness but denied symptoms on further questioning.  Per her husband, Mary Cook is typically capable of maintaining her own affairs independently, although he is always there to assist where needed. He stated that she is not to her baseline today, that she is refusing food and drink, which is uncharacteristic of her. He states that she often "zones out" in conversation and has to start all over again. Husband denies history of visual disorders other than wearing glasses.  Past medical history is significant for alzheimer's dementia (high functioning), hypertension, CAD, diabetes.  Imaging: CT head 5/30: Small volume subarachnoid hemorrhage in the right quadrigeminal plate cistern; Small volume hemorrhage in the occipital horn right lateral ventricle and septum pellucidum. Extensive remote ischemic injury. CT neck 5/30: Negative for fracture.   Husband present at bedside  Past Medical History:  Diagnosis Date  . CAD (coronary artery disease)    a. 04/2010 MI DES x 2 to LCX;  b. 05/2014 Cath: EF 35%, patent LCX stents w/ 70% stenosis in small AV groove  LCX, Diag 50-60, RCA 50-60p->Med Rx.  . Cerebral aneurysm   . Chronic combined systolic (congestive) and diastolic (congestive) heart failure ([place])    a. 01/2015 Echo: EF 30-35%, sev inflat HK, Gr 1DD, mild AI.  Marland Kitchen Diverticulosis of colon   . Essential hypertension   . Frequent urination   . GIB (gastrointestinal bleeding)    a. 2002 Diverticular bleed.  . Hyperlipidemia   . Ischemic cardiomyopathy    a. 01/2015 Echo: EF 30-35%.  . Osteoarthritis   . Osteopenia   . Skin disorder   . Sleep apnea    cpap therapy  . Stroke ([place]) 2002  . T12 compression fracture ([place])    a. 04/2015.  Marland Kitchen Thrombus   . Type II diabetes mellitus ([place])   . Visual disorder     Past Surgical History:  Procedure Laterality Date  . ABDOMINAL  HYSTERECTOMY  1980   no bso  . APPENDECTOMY    . CARDIAC CATHETERIZATION  07/2011   LAD OK, D1 80%, CFX 30% w/ patent stent, RCA 50-60%, EF 40%  . CARDIAC CATHETERIZATION    . CORONARY ANGIOPLASTY WITH STENT PLACEMENT  05/11/2010   stent CFX  . DOPPLER ECHOCARDIOGRAPHY    . LEFT HEART CATHETERIZATION WITH CORONARY ANGIOGRAM N/A 08/18/2011   Procedure: LEFT HEART CATHETERIZATION WITH CORONARY ANGIOGRAM;  Surgeon: Iran Ouch, MD;  Location: [facility] CATH LAB;  Service: Cardiovascular;  Laterality: N/A;  . LEFT HEART CATHETERIZATION WITH CORONARY ANGIOGRAM N/A 06/08/2014   Procedure: LEFT HEART CATHETERIZATION WITH CORONARY ANGIOGRAM;  Surgeon: Lennette Bihari, MD;  Location: [facility] C[facility] LAB;  Service: Cardiovascular;  Laterality: N/A;  . myocardial perfusion study    . THROMBECTOMY    . TONSILLECTOMY      Family History  Problem Relation Age of Onset  . Hypertension Other   . CAD Unknown        father    Social History:  reports that she has never smoked. She has never used smokeless tobacco. She reports that she does not drink alcohol or use drugs.  Allergies  Allergen Reactions  . Clopidogrel Bisulfate Nausea And Vomiting    Patient is not aware of this allergy   . Lipitor [Atorvastatin] Other (See Comments)    Weak legs  . Namenda [Memantine Hcl]     N/v/d  . Lisinopril Cough    Patient is not aware of this allergy    MEDICATIONS:                                                                                                                   Current Meds  Medication Sig  . amLODipine (NORVASC) 5 MG tablet Take 1.5 tablets (7.5 mg total) by mouth daily.  Marland Kitchen aspirin 81 MG EC tablet Take 81 mg by mouth daily.    Marland Kitchen b complex vitamins tablet Take 1 tablet by mouth daily.  . Blood Glucose Monitoring Suppl (ACCU-CHEK AVIVA PLUS) w/Device KIT Use to check blood sugars twice a day Dx. E11.9  . carvedilol (COREG) 12.5 MG tablet Take 1/2 tablet twice a day  . citalopram (CELEXA) 20 MG tablet TAKE 1 TABLET EVERY DAY  . digoxin (LANOXIN) 0.125 MG tablet Take 0.5 tablets by mouth daily.  Marland Kitchen donepezil (ARICEPT) 5 MG tablet Take 1 tablet (5 mg total) by mouth daily.  . furosemide (LASIX) 40 MG tablet Take 0.5 tablets (20 mg total) by mouth daily.  . isosorbide mononitrate (IMDUR) 60 MG 24 hr tablet Take 1 tablet (60 mg total) by mouth daily.  . metFORMIN (GLUCOPHAGE) 500 MG tablet Take 1 tablet (500 mg total) by mouth daily with breakfast. (Patient taking differently: Take 500 mg by mouth 2 (two) times daily with a meal. )  . omeprazole (PRILOSEC) 20 MG capsule Take 20 mg by mouth daily.  . potassium chloride SA (KLOR-CON M20) 20 MEQ tablet Take 1 tablet (20  mEq total) by mouth daily.  . sacubitril-valsartan (ENTRESTO) 49-51 MG Take 1 tablet by mouth 2 (two) times daily.     Review Of Systems:                                                                                                           History obtained from the patient  General: Negative for fever Psychological: Positive for Alzheimer's dementia Ophthalmic: Negative for blurry or double vision, loss of vision in any field ENT: Negative for sore throat, abrupt loss of hearing Respiratory:  Negative for cough, shortness of breath Cardiovascular: Negative for chest pain, edema or irregular heartbeat Gastrointestinal: Negative for abdominal pain Genito-Urinary: Negative for dysuria, hematuria Musculoskeletal: Negative for joint swelling or muscular weakness Neurological: As noted in HPI Dermatological: Negative for rash or skin changes  Blood pressure (!) 160/71, pulse 60, temperature 97.4 F (36.3 C), temperature source Oral, resp. rate 19, height '5\' 5"'$  (1.651 m), weight 69.4 kg (153 lb), SpO2 92 %.   Physical Examination:                                                                                                      General: WDWN female. Appears calm and comfortable in bed; appears sleepy. HEENT:  Normocephalic, right temporal forehead ecchymosis and abrasion. Normal external eye and conjunctiva.  Normal external ears. Normal external nose, mucus membranes and septum.  Normal pharynx. Cardiovascular: S1, S2 normal, pulses palpable throughout   Pulmonary: chest clear, no wheezing, rales, normal symmetric air entry Abdomen: Soft, non-tender Extremities: no joint deformities, effusion, or inflammation and left hand wrapped second to injury obtained during fall. Musculoskeletal: no joint tenderness, deformity or swelling Tone and bulk:normal tone throughout; no atrophy noted Skin: warm and dry, no hyperpigmentation, vitiligo, or suspicious lesions  Neurological Examination:                                                                                               Mental Status: Mary Cook is alert, oriented to self, husband, objects and hospital, but unaware of which one. Not oriented to date. Speech fluentc without evidence of aphasia. Unable to follow commands even with repeated instruction. Exam limited due to patient participation. Cranial Nerves: II: No  visual field cut. Counts fingers normally. Identifies objects visually. PERRL III,IV, VI: Ptosis not  present, extra-ocular muscle movements intact bilaterally V,VII: Smile with incidental facial asymmetry, with initial question of left facial droop - husband states that her face looks as it always has to him. Eyebrow raise is symmetric. Facial light touch and pinprick sensation intact bilaterally VIII: Hearing grossly intact to loud conversation IX,X: Uvula and palate rise symmetrically XI: SCM and bilateral shoulder shrug strength 5/5 on right, 4/5 on right XII: Midline tongue extension Motor: Right :     Upper extremity   5/5   Left:     Upper extremity   5/5          Lower extremity   5/5     Lower extremity   5/5 Pronator drift: Unable to assess Sensory: Pinprick and light touch intact throughout, bilaterally Deep Tendon Reflexes: 2+ and symmetric throughout BUE, dropped in BLE Plantars: Right: downgoing   Left: downgoing Cerebellar: Finger-to-nose test with minimal ataxia bilaterally. Gait: Not tested   Lab Results: Basic Metabolic Panel:  Recent Labs Lab 01/22/17 1331  NA 138  K 4.2  CL 102  CO2 29  GLUCOSE 149*  BUN 21*  CREATININE 1.76*  CALCIUM 9.4    Liver Function Tests:  Recent Labs Lab 01/22/17 1331  AST 17  ALT 13*  ALKPHOS 60  BILITOT 0.6  PROT 7.2  ALBUMIN 3.7   No results for input(s): LIPASE, AMYLASE in the last 168 hours. No results for input(s): AMMONIA in the last 168 hours.  CBC:  Recent Labs Lab 01/22/17 1331  WBC 6.1  NEUTROABS 4.0  HGB 11.0*  HCT 33.7*  MCV 95.2  PLT 179    Cardiac Enzymes:  Recent Labs Lab 01/22/17 1331  TROPONINI <0.03    Lipid Panel: No results for input(s): CHOL, TRIG, HDL, CHOLHDL, VLDL, LDLCALC in the last 168 hours.  CBG:  Recent Labs Lab 01/22/17 1328  GLUCAP 145*    Microbiology: Results for orders placed or performed during the hospital encounter of 01/22/17  MRSA PCR Screening     Status: None   Collection Time: 01/23/17 12:00 AM  Result Value Ref Range Status   MRSA by PCR  NEGATIVE NEGATIVE Final    Coagulation Studies: No results for input(s): LABPROT, INR in the last 72 hours.  Imaging: Dg Chest 2 View  Result Date: 01/22/2017 CLINICAL DATA:  Initial evaluation for acute altered mental status, unresponsive. EXAM: CHEST  2 VIEW COMPARISON:  Prior CT from 06/19/2016. FINDINGS: Mild cardiomegaly, stable. Mediastinal silhouette within normal limits. Aortic atherosclerosis. Lungs mildly hypoinflated. No focal infiltrates. Mild perihilar vascular congestion like related hypoinflation. No pulmonary edema. No pleural effusion. No pneumothorax. No acute osseus abnormality. IMPRESSION: 1. No active cardiopulmonary disease. 2. Stable cardiomegaly without pulmonary edema. 3. Aortic atherosclerosis. Electronically Signed   By: Rise Mu M.D.   On: 01/22/2017 14:23   Ct Head Wo Contrast  Result Date: 01/22/2017 CLINICAL DATA:  Fall on ground. Minimally responsive. Initial encounter. EXAM: CT HEAD WITHOUT CONTRAST CT CERVICAL SPINE WITHOUT CONTRAST TECHNIQUE: Multidetector CT imaging of the head and cervical spine was performed following the standard protocol without intravenous contrast. Multiplanar CT image reconstructions of the cervical spine were also generated. COMPARISON:  08/01/2014 FINDINGS: CT HEAD FINDINGS Brain: Small volume subarachnoid hemorrhage in the quadrigeminal plate cistern on the right. Trace hemorrhage seen in the occipital horn of the right lateral ventricle and in the septum pellucidum (the latter measuring 5  mm in maximal dimension). Remote infarct in the right parietal and occipital lobe with ex vacuo right lateral ventricular enlargement. Remote lateral lenticulostriate distribution infarct on the right. Extensive chronic small vessel ischemic gliosis in the cerebral white matter. Probable remote lacunar infarct in the left thalamus. Small remote inferior left cerebellar infarct. Vascular: Atherosclerotic calcification. Skull: Negative for  fracture Sinuses/Orbits: No acute finding.  Bilateral cataract resection. Other: Critical Value/emergent results were called by telephone at the time of interpretation on 01/22/2017 at 2:42 pm to Dr. Brantley Stage , who verbally acknowledged these results. CT CERVICAL SPINE FINDINGS Alignment: No traumatic malalignment. Facet mediated anterolisthesis at C3-4 and C4-5, mild. Skull base and vertebrae: No acute fracture. Soft tissues and spinal canal: No prevertebral fluid or swelling. No visible canal hematoma. Disc levels: Diffuse degenerative facet spurring. C5-6 and C6-7 predominant disc degeneration. No suspected degenerative cord impingement. Upper chest: No acute finding IMPRESSION: 1. Small volume subarachnoid hemorrhage in the right quadrigeminal plate cistern. 2. Small volume hemorrhage in the occipital horn right lateral ventricle and septum pellucidum. 3. Negative for cervical spine fracture. 4. Extensive remote ischemic injury. Electronically Signed   By: Monte Fantasia M.D.   On: 01/22/2017 14:45   Ct Cervical Spine Wo Contrast  Result Date: 01/22/2017 CLINICAL DATA:  Fall on ground. Minimally responsive. Initial encounter. EXAM: CT HEAD WITHOUT CONTRAST CT CERVICAL SPINE WITHOUT CONTRAST TECHNIQUE: Multidetector CT imaging of the head and cervical spine was performed following the standard protocol without intravenous contrast. Multiplanar CT image reconstructions of the cervical spine were also generated. COMPARISON:  08/01/2014 FINDINGS: CT HEAD FINDINGS Brain: Small volume subarachnoid hemorrhage in the quadrigeminal plate cistern on the right. Trace hemorrhage seen in the occipital horn of the right lateral ventricle and in the septum pellucidum (the latter measuring 5 mm in maximal dimension). Remote infarct in the right parietal and occipital lobe with ex vacuo right lateral ventricular enlargement. Remote lateral lenticulostriate distribution infarct on the right. Extensive chronic small vessel  ischemic gliosis in the cerebral white matter. Probable remote lacunar infarct in the left thalamus. Small remote inferior left cerebellar infarct. Vascular: Atherosclerotic calcification. Skull: Negative for fracture Sinuses/Orbits: No acute finding.  Bilateral cataract resection. Other: Critical Value/emergent results were called by telephone at the time of interpretation on 01/22/2017 at 2:42 pm to Dr. Brantley Stage , who verbally acknowledged these results. CT CERVICAL SPINE FINDINGS Alignment: No traumatic malalignment. Facet mediated anterolisthesis at C3-4 and C4-5, mild. Skull base and vertebrae: No acute fracture. Soft tissues and spinal canal: No prevertebral fluid or swelling. No visible canal hematoma. Disc levels: Diffuse degenerative facet spurring. C5-6 and C6-7 predominant disc degeneration. No suspected degenerative cord impingement. Upper chest: No acute finding IMPRESSION: 1. Small volume subarachnoid hemorrhage in the right quadrigeminal plate cistern. 2. Small volume hemorrhage in the occipital horn right lateral ventricle and septum pellucidum. 3. Negative for cervical spine fracture. 4. Extensive remote ischemic injury. Electronically Signed   By: Monte Fantasia M.D.   On: 01/22/2017 14:45    History and examination documented by Solon Augusta PA-C, [facility] 01/23/2017, 10:00 AM  Assessment: 83 year old female with small traumatic subarachnoid hemorrhage 1. CT revealed a small volume subarachnoid hemorrhage in the right quadrigeminal plate cistern. Small volume hemorrhage in the occipital horn right lateral ventricle and septum pellucidum. Extensive remote ischemic injury. 2. Given history of cerebral aneurysm, it would be reasonable in most cases to obtain CTA head to evaluate for possible enlargement and additional characterization. However,  she has poor renal function. Therefore an MRA head is the next best option. Consider discussing with interventional neuroradiology  prior to MRA as given her advanced age she may not be a good candidate for coiling.  3. CAD, HTN, DM2  Recommendations: 1. Falls risk precautions at home, including home PT evaluation 2. BP management with SBP goal of < 140 3. No anticoagulants or antiplatelet agents indefinitely, due to risk of recurrent hemorrhage from fall and/or aneurysmal rupture/leak.  Electronically signed: Dr. Kerney Elbe

## 2017-01-23 NOTE — Care Management Note (Addendum)
Case Management Note  Patient Details  Name: Mary Cook MRN: [id number] Date of Birth: 1926/03/07  Subjective/Objective:  From home with spouse, presents with SAH from fall and CHF, did not have loc, her home was positive for carbon monoxide.  She has Dementia at baseline. Has [employer].  She has a wheelchair, a rolling walker a hospital bed.  They live in [place] , Spouse states he does not think they will need [place] services.  Patient has 3 sisters in [place], they come to visit her a lot.  Spouse states he is with her every day for 24 hrs, they have been married 50 years.  He states they would like to work with [facility] if they need any [place] services.  Await pt/ot eval.   PCP   Tyrone Apple Plotnikov                 Action/Plan: NCM will follow for dc needs.  Expected Discharge Date:                  Expected Discharge Plan:  Home/Self Care  In-House Referral:     Discharge planning Services  CM Consult  Post Acute Care Choice:    Choice offered to:     DME Arranged:    DME Agency:     HH Arranged:    HH Agency:     Status of Service:  In process, will continue to follow  If discussed at Long Length of Stay Meetings, dates discussed:    Additional Comments:  Zenon Mayo, RN 01/23/2017, 12:16 PM

## 2017-01-24 ENCOUNTER — Observation Stay (HOSPITAL_COMMUNITY): Payer: Medicare HMO

## 2017-01-24 ENCOUNTER — Encounter (HOSPITAL_COMMUNITY): Payer: Self-pay | Admitting: Radiology

## 2017-01-24 DIAGNOSIS — Z79899 Other long term (current) drug therapy: Secondary | ICD-10-CM | POA: Diagnosis not present

## 2017-01-24 DIAGNOSIS — G4730 Sleep apnea, unspecified: Secondary | ICD-10-CM | POA: Diagnosis present

## 2017-01-24 DIAGNOSIS — I2510 Atherosclerotic heart disease of native coronary artery without angina pectoris: Secondary | ICD-10-CM | POA: Diagnosis present

## 2017-01-24 DIAGNOSIS — Z8249 Family history of ischemic heart disease and other diseases of the circulatory system: Secondary | ICD-10-CM | POA: Diagnosis not present

## 2017-01-24 DIAGNOSIS — E785 Hyperlipidemia, unspecified: Secondary | ICD-10-CM | POA: Diagnosis present

## 2017-01-24 DIAGNOSIS — Z955 Presence of coronary angioplasty implant and graft: Secondary | ICD-10-CM | POA: Diagnosis not present

## 2017-01-24 DIAGNOSIS — N183 Chronic kidney disease, stage 3 (moderate): Secondary | ICD-10-CM | POA: Diagnosis present

## 2017-01-24 DIAGNOSIS — S066X0A Traumatic subarachnoid hemorrhage without loss of consciousness, initial encounter: Secondary | ICD-10-CM | POA: Diagnosis present

## 2017-01-24 DIAGNOSIS — I5042 Chronic combined systolic (congestive) and diastolic (congestive) heart failure: Secondary | ICD-10-CM | POA: Diagnosis present

## 2017-01-24 DIAGNOSIS — F0280 Dementia in other diseases classified elsewhere without behavioral disturbance: Secondary | ICD-10-CM | POA: Diagnosis present

## 2017-01-24 DIAGNOSIS — K219 Gastro-esophageal reflux disease without esophagitis: Secondary | ICD-10-CM | POA: Diagnosis present

## 2017-01-24 DIAGNOSIS — I255 Ischemic cardiomyopathy: Secondary | ICD-10-CM | POA: Diagnosis present

## 2017-01-24 DIAGNOSIS — I6200 Nontraumatic subdural hemorrhage, unspecified: Secondary | ICD-10-CM | POA: Diagnosis not present

## 2017-01-24 DIAGNOSIS — I671 Cerebral aneurysm, nonruptured: Secondary | ICD-10-CM | POA: Diagnosis present

## 2017-01-24 DIAGNOSIS — Z7984 Long term (current) use of oral hypoglycemic drugs: Secondary | ICD-10-CM | POA: Diagnosis not present

## 2017-01-24 DIAGNOSIS — W1830XA Fall on same level, unspecified, initial encounter: Secondary | ICD-10-CM | POA: Diagnosis present

## 2017-01-24 DIAGNOSIS — R32 Unspecified urinary incontinence: Secondary | ICD-10-CM | POA: Diagnosis present

## 2017-01-24 DIAGNOSIS — D631 Anemia in chronic kidney disease: Secondary | ICD-10-CM | POA: Diagnosis present

## 2017-01-24 DIAGNOSIS — E1136 Type 2 diabetes mellitus with diabetic cataract: Secondary | ICD-10-CM | POA: Diagnosis present

## 2017-01-24 DIAGNOSIS — Y92009 Unspecified place in unspecified non-institutional (private) residence as the place of occurrence of the external cause: Secondary | ICD-10-CM | POA: Diagnosis not present

## 2017-01-24 DIAGNOSIS — R4182 Altered mental status, unspecified: Secondary | ICD-10-CM | POA: Diagnosis present

## 2017-01-24 DIAGNOSIS — Z66 Do not resuscitate: Secondary | ICD-10-CM | POA: Diagnosis present

## 2017-01-24 DIAGNOSIS — I609 Nontraumatic subarachnoid hemorrhage, unspecified: Secondary | ICD-10-CM | POA: Diagnosis not present

## 2017-01-24 DIAGNOSIS — E0800 Diabetes mellitus due to underlying condition with hyperosmolarity without nonketotic hyperglycemic-hyperosmolar coma (NKHHC): Secondary | ICD-10-CM | POA: Diagnosis not present

## 2017-01-24 DIAGNOSIS — Z7982 Long term (current) use of aspirin: Secondary | ICD-10-CM | POA: Diagnosis not present

## 2017-01-24 DIAGNOSIS — S066X1A Traumatic subarachnoid hemorrhage with loss of consciousness of 30 minutes or less, initial encounter: Secondary | ICD-10-CM | POA: Diagnosis not present

## 2017-01-24 DIAGNOSIS — E1122 Type 2 diabetes mellitus with diabetic chronic kidney disease: Secondary | ICD-10-CM | POA: Diagnosis present

## 2017-01-24 DIAGNOSIS — R296 Repeated falls: Secondary | ICD-10-CM | POA: Diagnosis present

## 2017-01-24 DIAGNOSIS — I130 Hypertensive heart and chronic kidney disease with heart failure and stage 1 through stage 4 chronic kidney disease, or unspecified chronic kidney disease: Secondary | ICD-10-CM | POA: Diagnosis present

## 2017-01-24 DIAGNOSIS — G309 Alzheimer's disease, unspecified: Secondary | ICD-10-CM | POA: Diagnosis present

## 2017-01-24 DIAGNOSIS — Z8673 Personal history of transient ischemic attack (TIA), and cerebral infarction without residual deficits: Secondary | ICD-10-CM | POA: Diagnosis not present

## 2017-01-24 LAB — GLUCOSE, CAPILLARY
Glucose-Capillary: 133 mg/dL — ABNORMAL HIGH (ref 65–99)
Glucose-Capillary: 165 mg/dL — ABNORMAL HIGH (ref 65–99)
Glucose-Capillary: 211 mg/dL — ABNORMAL HIGH (ref 65–99)
Glucose-Capillary: 123 mg/dL — ABNORMAL HIGH (ref 65–99)

## 2017-01-24 LAB — HEMOGLOBIN A1C
HEMOGLOBIN A1C: 6.1 % — AB (ref 4.8–5.6)
Mean Plasma Glucose: 128 mg/dL

## 2017-01-24 NOTE — Care Management Obs Status (Signed)
[place] NOTIFICATION   Patient Details  Name: Mary Cook MRN: [id number] Date of Birth: [birth year]   Medicare Observation Status Notification Given:  Yes    Zenon Mayo, RN 01/24/2017, 3:52 PM

## 2017-01-24 NOTE — Progress Notes (Signed)
@IPLOG         PROGRESS NOTE                                                                                                                                                                                                             Patient Demographics:    Mary Cook, is a 83 y.o. female, DOB - 01-21-1926, UQJ:[id number]  Admit date - 01/22/2017   Admitting Physician Orvan Falconer, MD  Outpatient Primary MD for the patient is Plotnikov, Evie Lacks, MD  LOS - 0  Chief Complaint  Patient presents with  . Altered Mental Status       Brief Narrative  Mary Cook is an 83 y.o. female with hx of dementia, but generally high functional, ambulate with assistance from her husband, hx of HTN, DM2, CAD s/p stents, fell today and hit her head.  She did not lose consciousness.  CT of the head in the ER showed small subarachnoid hemorrhage in the right quadrigeminal plate cistern.and in the occipital horn right lateral ventricle and septum pellucidum with extensive remote ischemic injury.  Her neck CT showed no acute Fx.  Neurosurgery was consulted, and deemed non surgical.  [facility] service was consulted for admission, and deferred to hospitalist admission.  Her Cr was found to be 1.7, and her platelet count was normal.  She has been on ASA, but not full anticoagulation.        Subjective:   Patient in bed, appears comfortable, denies any headache, no fever, no chest pain or pressure, no shortness of breath , no abdominal pain. No focal weakness.    Assessment  & Plan :     1. Mechanical fall with traumatic subarachnoid hemorrhage. Neurosurgery, trauma and neurology all called and informed, neurology following, patient stable, seen by PT OT, mental status stable no focal deficits, no headache. Per neuro obtaining MRA brain, case discussed with neuro IR as well. They will evaluate the patient after MRA brain.  2. CK D3. Currently at baseline of creatinine around 1.8.  3.  Chronic systolic CHF EF around 38-93%. Currently on combination of Coreg, Imdur, Entresto and Lasix which will be continued. Clinically compensated.  4. Hypertension. Stable on combination of Norvasc, Coreg, Imdur, Entresto and diuretic.  5. CAD. No acute issues aspirin on hold due to #1 above, continue beta blocker and Imdur.  6. GERD. Continue PPI.  7. DM type II. Remains stable on sliding scale insulin.  CBG (last 3)   Recent Labs  01/23/17 1658 01/23/17 2157  01/24/17 0836  GLUCAP 181* 220* 133*      Diet : Diet regular Room service appropriate? Yes; Fluid consistency: Thin    Family Communication  :  Husband  Code Status :  DNR  Disposition Plan  :  TBD  Consults  :  Called Neurosurgery, trauma and neurology, Also discussed with neuro IR doctor Deveshwar  Procedures  :    CT head. Small subarachnoid bleed  MRA Brain  DVT Prophylaxis  :    SCDs    Lab Results  Component Value Date   PLT 179 01/22/2017    Inpatient Medications  Scheduled Meds: . amLODipine  7.5 mg Oral Daily  . carvedilol  12.5 mg Oral BID WC  . citalopram  20 mg Oral Daily  . digoxin  62.5 mcg Oral Daily  . donepezil  5 mg Oral Daily  . furosemide  20 mg Oral Daily  . insulin aspart  0-5 Units Subcutaneous QHS  . insulin aspart  0-9 Units Subcutaneous TID WC  . isosorbide mononitrate  60 mg Oral Daily  . pantoprazole  40 mg Oral Daily  . potassium chloride SA  20 mEq Oral Daily  . sacubitril-valsartan  1 tablet Oral BID   Continuous Infusions: PRN Meds:.acetaminophen **OR** acetaminophen  Antibiotics  :    Anti-infectives    None         Objective:   Vitals:   01/23/17 2308 01/24/17 0333 01/24/17 0823 01/24/17 0824  BP: 102/86 135/80  (!) 145/56  Pulse: 60 (!) 58 (!) 53 (!) 56  Resp: 19 16 14 18   Temp: 99.1 F (37.3 C) 99.1 F (37.3 C) 98.5 F (36.9 C)   TempSrc: Oral Oral Oral   SpO2: 93% 98% 96% 94%  Weight:      Height:        Wt Readings from Last 3  Encounters:  01/22/17 69.4 kg (153 lb)  01/10/17 68.5 kg (151 lb)  12/12/16 69.9 kg (154 lb 1.3 oz)     Intake/Output Summary (Last 24 hours) at 01/24/17 1050 Last data filed at 01/24/17 0900  Gross per 24 hour  Intake              300 ml  Output               50 ml  Net              250 ml     Physical Exam  Awake Alert, Oriented X 3, No new F.N deficits, Normal affect [place].AT,PERRAL Supple Neck,No JVD, No cervical lymphadenopathy appriciated.  Symmetrical Chest wall movement, Good air movement bilaterally, CTAB RRR,No Gallops,Rubs or new Murmurs, No Parasternal Heave +ve B.Sounds, Abd Soft, No tenderness, No organomegaly appriciated, No rebound - guarding or rigidity. No Cyanosis, Clubbing or edema, No new Rash or bruise   Data Review:    CBC  Recent Labs Lab 01/22/17 1331  WBC 6.1  HGB 11.0*  HCT 33.7*  PLT 179  MCV 95.2  MCH 31.1  MCHC 32.6  RDW 12.7  LYMPHSABS 1.5  MONOABS 0.5  EOSABS 0.1  BASOSABS 0.0    Chemistries   Recent Labs Lab 01/22/17 1331  NA 138  K 4.2  CL 102  CO2 29  GLUCOSE 149*  BUN 21*  CREATININE 1.76*  CALCIUM 9.4  AST 17  ALT 13*  ALKPHOS 60  BILITOT 0.6   ------------------------------------------------------------------------------------------------------------------ No results for input(s): CHOL, HDL, LDLCALC, TRIG, CHOLHDL, LDLDIRECT in the last  72 hours.  Lab Results  Component Value Date   HGBA1C 6.1 (H) 01/23/2017   ------------------------------------------------------------------------------------------------------------------ No results for input(s): TSH, T4TOTAL, T3FREE, THYROIDAB in the last 72 hours.  Invalid input(s): FREET3 ------------------------------------------------------------------------------------------------------------------ No results for input(s): VITAMINB12, FOLATE, FERRITIN, TIBC, IRON, RETICCTPCT in the last 72 hours.  Coagulation profile No results for input(s): INR, PROTIME in the  last 168 hours.  No results for input(s): DDIMER in the last 72 hours.  Cardiac Enzymes  Recent Labs Lab 01/22/17 1331  TROPONINI <0.03   ------------------------------------------------------------------------------------------------------------------    Component Value Date/Time   BNP 174.8 (H) 08/13/2016 1131    Micro Results Recent Results (from the past 240 hour(s))  MRSA PCR Screening     Status: None   Collection Time: 01/23/17 12:00 AM  Result Value Ref Range Status   MRSA by PCR NEGATIVE NEGATIVE Final    Radiology Reports Dg Chest 2 View  Result Date: 01/22/2017 CLINICAL DATA:  Initial evaluation for acute altered mental status, unresponsive. EXAM: CHEST  2 VIEW COMPARISON:  Prior CT from 06/19/2016. FINDINGS: Mild cardiomegaly, stable. Mediastinal silhouette within normal limits. Aortic atherosclerosis. Lungs mildly hypoinflated. No focal infiltrates. Mild perihilar vascular congestion like related hypoinflation. No pulmonary edema. No pleural effusion. No pneumothorax. No acute osseus abnormality. IMPRESSION: 1. No active cardiopulmonary disease. 2. Stable cardiomegaly without pulmonary edema. 3. Aortic atherosclerosis. Electronically Signed   By: Jeannine Boga M.D.   On: 01/22/2017 14:23   Ct Head Wo Contrast  Result Date: 01/22/2017 CLINICAL DATA:  Fall on ground. Minimally responsive. Initial encounter. EXAM: CT HEAD WITHOUT CONTRAST CT CERVICAL SPINE WITHOUT CONTRAST TECHNIQUE: Multidetector CT imaging of the head and cervical spine was performed following the standard protocol without intravenous contrast. Multiplanar CT image reconstructions of the cervical spine were also generated. COMPARISON:  08/01/2014 FINDINGS: CT HEAD FINDINGS Brain: Small volume subarachnoid hemorrhage in the quadrigeminal plate cistern on the right. Trace hemorrhage seen in the occipital horn of the right lateral ventricle and in the septum pellucidum (the latter measuring 5 mm in  maximal dimension). Remote infarct in the right parietal and occipital lobe with ex vacuo right lateral ventricular enlargement. Remote lateral lenticulostriate distribution infarct on the right. Extensive chronic small vessel ischemic gliosis in the cerebral white matter. Probable remote lacunar infarct in the left thalamus. Small remote inferior left cerebellar infarct. Vascular: Atherosclerotic calcification. Skull: Negative for fracture Sinuses/Orbits: No acute finding.  Bilateral cataract resection. Other: Critical Value/emergent results were called by telephone at the time of interpretation on 01/22/2017 at 2:42 pm to Dr. Brantley Stage , who verbally acknowledged these results. CT CERVICAL SPINE FINDINGS Alignment: No traumatic malalignment. Facet mediated anterolisthesis at C3-4 and C4-5, mild. Skull base and vertebrae: No acute fracture. Soft tissues and spinal canal: No prevertebral fluid or swelling. No visible canal hematoma. Disc levels: Diffuse degenerative facet spurring. C5-6 and C6-7 predominant disc degeneration. No suspected degenerative cord impingement. Upper chest: No acute finding IMPRESSION: 1. Small volume subarachnoid hemorrhage in the right quadrigeminal plate cistern. 2. Small volume hemorrhage in the occipital horn right lateral ventricle and septum pellucidum. 3. Negative for cervical spine fracture. 4. Extensive remote ischemic injury. Electronically Signed   By: Monte Fantasia M.D.   On: 01/22/2017 14:45   Ct Cervical Spine Wo Contrast  Result Date: 01/22/2017 CLINICAL DATA:  Fall on ground. Minimally responsive. Initial encounter. EXAM: CT HEAD WITHOUT CONTRAST CT CERVICAL SPINE WITHOUT CONTRAST TECHNIQUE: Multidetector CT imaging of the head and cervical spine was performed following  the standard protocol without intravenous contrast. Multiplanar CT image reconstructions of the cervical spine were also generated. COMPARISON:  08/01/2014 FINDINGS: CT HEAD FINDINGS Brain: Small volume  subarachnoid hemorrhage in the quadrigeminal plate cistern on the right. Trace hemorrhage seen in the occipital horn of the right lateral ventricle and in the septum pellucidum (the latter measuring 5 mm in maximal dimension). Remote infarct in the right parietal and occipital lobe with ex vacuo right lateral ventricular enlargement. Remote lateral lenticulostriate distribution infarct on the right. Extensive chronic small vessel ischemic gliosis in the cerebral white matter. Probable remote lacunar infarct in the left thalamus. Small remote inferior left cerebellar infarct. Vascular: Atherosclerotic calcification. Skull: Negative for fracture Sinuses/Orbits: No acute finding.  Bilateral cataract resection. Other: Critical Value/emergent results were called by telephone at the time of interpretation on 01/22/2017 at 2:42 pm to Dr. Brantley Stage , who verbally acknowledged these results. CT CERVICAL SPINE FINDINGS Alignment: No traumatic malalignment. Facet mediated anterolisthesis at C3-4 and C4-5, mild. Skull base and vertebrae: No acute fracture. Soft tissues and spinal canal: No prevertebral fluid or swelling. No visible canal hematoma. Disc levels: Diffuse degenerative facet spurring. C5-6 and C6-7 predominant disc degeneration. No suspected degenerative cord impingement. Upper chest: No acute finding IMPRESSION: 1. Small volume subarachnoid hemorrhage in the right quadrigeminal plate cistern. 2. Small volume hemorrhage in the occipital horn right lateral ventricle and septum pellucidum. 3. Negative for cervical spine fracture. 4. Extensive remote ischemic injury. Electronically Signed   By: Monte Fantasia M.D.   On: 01/22/2017 14:45    Time Spent in minutes  30   Lala Lund M.D on 01/24/2017 at 10:50 AM  Between 7am to 7pm - Pager - [phone] ( page via [email or web address], text pages only, please mention full 10 digit call back number). After 7pm go to [email or web address] - password [email or web address]

## 2017-01-25 DIAGNOSIS — S066X1A Traumatic subarachnoid hemorrhage with loss of consciousness of 30 minutes or less, initial encounter: Secondary | ICD-10-CM

## 2017-01-25 LAB — GLUCOSE, CAPILLARY
GLUCOSE-CAPILLARY: 155 mg/dL — AB (ref 65–99)
Glucose-Capillary: 195 mg/dL — ABNORMAL HIGH (ref 65–99)

## 2017-01-25 LAB — TSH: TSH: 1.5 u[IU]/mL (ref 0.350–4.500)

## 2017-01-25 MED ORDER — CARVEDILOL 6.25 MG PO TABS: 6.2500 mg | ORAL_TABLET | Freq: Two times a day (BID) | ORAL | Status: DC

## 2017-01-25 MED ORDER — CARVEDILOL 6.25 MG PO TABS: 6.2500 mg | ORAL_TABLET | Freq: Two times a day (BID) | ORAL | 0 refills | Status: DC

## 2017-01-25 MED ORDER — METFORMIN HCL 500 MG PO TABS: 500.0000 mg | ORAL_TABLET | Freq: Two times a day (BID) | ORAL | Status: DC

## 2017-01-25 MED ORDER — SODIUM CHLORIDE 0.9 % IV BOLUS (SEPSIS)
250.0000 mL | Freq: Once | INTRAVENOUS | Status: AC
  Administered 2017-01-25: 250 mL via INTRAVENOUS

## 2017-01-25 MED ORDER — NIMODIPINE 30 MG PO CAPS: 60.0000 mg | ORAL_CAPSULE | ORAL | 0 refills | Status: DC

## 2017-01-25 MED ORDER — NIMODIPINE 30 MG PO CAPS
60.0000 mg | ORAL_CAPSULE | ORAL | Status: DC
  Administered 2017-01-25 (×2): 60 mg via ORAL
  Filled 2017-01-25 (×2): qty 2

## 2017-01-25 NOTE — Evaluation (Signed)
Occupational Therapy Evaluation and Discharge Patient Details Name: Mary Cook MRN: [id number] DOB: 02/18/1926 Today's Date: 01/25/2017    History of Present Illness Pt is an 83 y/o female admitted secondary to having a mechanical fall at home in which she sustained a subarachnoid hemorrhage. PMH including but not limited to dementia, HTN, DM, CAD, CHF and hx of T12 compression fx.   Clinical Impression   Per husband, pt dependent for ADL PTA. Currently pt overall max assist for ADL and min assist-min guard for functional mobility. Pt planning to d/c home with 24/7 assist from her husband. Recommending [place] aide to reduce burden of care upon return home and tub bench for increased safety with tub transfers. No further acute OT needs identified; signing off at this time. Please re-consult if needs change. Thank you for this referral.    Follow Up Recommendations  No OT follow up;Supervision/Assistance - 24 hour;Other (comment) ([place] aide)    Equipment Recommendations  Tub/shower bench    Recommendations for Other Services       Precautions / Restrictions Precautions Precautions: Fall Precaution Comments: husband reports 10-12 falls in the past 6 months Restrictions Weight Bearing Restrictions: No      Mobility Bed Mobility Overal bed mobility: Needs Assistance Bed Mobility: Supine to Sit;Sit to Supine     Supine to sit: Mod assist;+2 for physical assistance;HOB elevated Sit to supine: Min guard   General bed mobility comments: increased time, verbal and tactile cueing for sequencing, assist with LEs and trunk elevation to achieve sitting EOB, use of bed pads to position pt's hips  Transfers Overall transfer level: Needs assistance Equipment used: Rolling walker (2 wheeled) Transfers: Sit to/from Stand Sit to Stand: Min guard;Min assist         General transfer comment: min guard for safety with rise from bed, min A to rise from low toilet in pt's bathroom. pt with  preference for pulling on RW with bilateral UEs    Balance Overall balance assessment: Needs assistance;History of Falls Sitting-balance support: Feet supported Sitting balance-Leahy Scale: Poor Sitting balance - Comments: pt progressing from max A to very close min guard to sit EOB Postural control: Posterior lean Standing balance support: During functional activity;Bilateral upper extremity supported Standing balance-Leahy Scale: Poor Standing balance comment: pt reliant on bilateral UEs on RW and close min guard                           ADL either performed or assessed with clinical judgement   ADL Overall ADL's : Needs assistance/impaired Eating/Feeding: Set up;Sitting   Grooming: Minimal assistance;Sitting   Upper Body Bathing: Maximal assistance;Sitting   Lower Body Bathing: Maximal assistance;Sit to/from stand   Upper Body Dressing : Moderate assistance;Sitting   Lower Body Dressing: Maximal assistance;Sit to/from stand   Toilet Transfer: Minimal assistance;Ambulation;Regular Toilet;Grab bars;RW   Toileting- Clothing Manipulation and Hygiene: Moderate assistance;Sit to/from stand Toileting - Clothing Manipulation Details (indicate cue type and reason): pt able to perform peri care in sitting but assited to make sure she was clean after BM   Tub/Shower Transfer Details (indicate cue type and reason): Educated husband on use of tub bench vs current shower chair for safety with tub transfers and bathing. Husband is agreeable. Functional mobility during ADLs: Minimal assistance;Min guard;Rolling walker       Vision         Perception     Praxis      Pertinent Vitals/Pain  Pain Assessment: No/denies pain     Hand Dominance     Extremity/Trunk Assessment Upper Extremity Assessment Upper Extremity Assessment: Generalized weakness   Lower Extremity Assessment Lower Extremity Assessment: Defer to PT evaluation   Cervical / Trunk Assessment Cervical  / Trunk Assessment: Kyphotic   Communication Communication Communication: No difficulties   Cognition Arousal/Alertness: Awake/alert Behavior During Therapy: Flat affect Overall Cognitive Status: History of cognitive impairments - at baseline                                 General Comments: pt with dementia at baseline   General Comments       Exercises     Shoulder Instructions      Home Living Family/patient expects to be discharged to:: Private residence Living Arrangements: Spouse/significant other Available Help at Discharge: Family;Available 24 hours/day Type of Home: House Home Access: Level entry     Home Layout: Two level;Able to live on main level with bedroom/bathroom     Bathroom Shower/Tub: Tub/shower unit;Curtain   Bathroom Toilet: Standard     Home Equipment: Environmental consultant - 2 wheels;Cane - single point;Bedside commode;Shower seat;Wheelchair - manual;Hospital bed          Prior Functioning/Environment Level of Independence: Needs assistance  Gait / Transfers Assistance Needed: husband assisting with mobility; hold her arm and walks with her ADL's / Homemaking Assistance Needed: husband assisting with all ADL; reports he basically picks pt up and puts her in bath tub so she doesnt fall            OT Problem List: Decreased strength;Decreased activity tolerance;Impaired balance (sitting and/or standing);Decreased cognition;Decreased safety awareness;Decreased knowledge of use of DME or AE      OT Treatment/Interventions:      OT Goals(Current goals can be found in the care plan section) Acute Rehab OT Goals Patient Stated Goal: return home OT Goal Formulation: All assessment and education complete, DC therapy  OT Frequency:     Barriers to D/C:            Co-evaluation PT/OT/SLP Co-Evaluation/Treatment: Yes Reason for Co-Treatment: Necessary to address cognition/behavior during functional activity;For patient/therapist safety;To  address functional/ADL transfers PT goals addressed during session: Mobility/safety with mobility;Balance;Proper use of DME;Strengthening/ROM OT goals addressed during session: ADL's and self-care      AM-PAC PT "6 Clicks" Daily Activity     Outcome Measure Help from another person eating meals?: A Little Help from another person taking care of personal grooming?: A Lot Help from another person toileting, which includes using toliet, bedpan, or urinal?: A Lot Help from another person bathing (including washing, rinsing, drying)?: A Lot Help from another person to put on and taking off regular upper body clothing?: A Lot Help from another person to put on and taking off regular lower body clothing?: A Lot 6 Click Score: 13   End of Session Equipment Utilized During Treatment: Gait belt;Rolling walker Nurse Communication: Mobility status  Activity Tolerance: Patient tolerated treatment well Patient left: in bed;with call bell/phone within reach;with bed alarm set;with family/visitor present  OT Visit Diagnosis: Unsteadiness on feet (R26.81);Repeated falls (R29.6)                Time: 7858-8502 OT Time Calculation (min): 26 min Charges:  OT General Charges $OT Visit: 1 Procedure OT Evaluation $OT Eval Moderate Complexity: 1 Procedure G-Codes:     Kingsten [place] A. Ulice Brilliant, M.S., OTR/L Pager: [phone]  Binnie Kand 01/25/2017, 2:12 PM

## 2017-01-25 NOTE — Progress Notes (Signed)
Previous CM offered choice (see note) and this CM has called in referral to [facility] rep, Jermaine for HHPT/RN/Aide. No other CM needs were communicated.

## 2017-01-25 NOTE — Evaluation (Signed)
Physical Therapy Evaluation Patient Details Name: Mary Cook MRN: [id number] DOB: 1925-09-14 Today's Date: 01/25/2017   History of Present Illness  Pt is an 83 y/o female admitted secondary to having a mechanical fall at home in which she sustained a subarachnoid hemorrhage. PMH including but not limited to dementia, HTN, DM, CAD, CHF and hx of T12 compression fx.  Clinical Impression  Pt presented supine in bed with HOB elevated, awake and willing to participate in therapy session. Pt's husband was present throughout session as well. Pt's husband provided all information regarding PLOF and home environment. Pt ambulated within her with use of RW and min A for safety and stability. She was very pleasant and agreeable throughout session. Pt would continue to benefit from skilled physical therapy services at this time while admitted and after d/c to address the below listed limitations in order to improve overall safety and independence with functional mobility.     Follow Up Recommendations Home health PT;Supervision/Assistance - 24 hour;Other (comment) ([facility] aide)    Equipment Recommendations  Other (comment) (tub bench)    Recommendations for Other Services       Precautions / Restrictions Precautions Precautions: Fall Precaution Comments: husband reports 10-12 falls in the past 6 months Restrictions Weight Bearing Restrictions: No      Mobility  Bed Mobility Overal bed mobility: Needs Assistance Bed Mobility: Supine to Sit;Sit to Supine     Supine to sit: Mod assist;+2 for physical assistance;HOB elevated Sit to supine: Min guard   General bed mobility comments: increased time, verbal and tactile cueing for sequencing, assist with LEs and trunk elevation to achieve sitting EOB, use of bed pads to position pt's hips  Transfers Overall transfer level: Needs assistance Equipment used: Rolling walker (2 wheeled) Transfers: Sit to/from Stand Sit to Stand: Min guard;Min  assist         General transfer comment: min guard for safety with rise from bed, min A to rise from low toilet in pt's bathroom. pt with preference for pulling on RW with bilateral UEs  Ambulation/Gait Ambulation/Gait assistance: Min assist Ambulation Distance (Feet): 20 Feet (20' x2 with sitting rest break on toilet, +BM) Assistive device: Rolling walker (2 wheeled) Gait Pattern/deviations: Step-through pattern;Decreased step length - right;Decreased step length - left;Decreased stride length;Shuffle;Trunk flexed;Drifts right/left Gait velocity: decreased Gait velocity interpretation: <1.8 ft/sec, indicative of risk for recurrent falls General Gait Details: modest instability with ambulation but no overt LOB, min A for safety and stability with use of RW. Very slow shuffling gait  Stairs            Wheelchair Mobility    Modified Rankin (Stroke Patients Only)       Balance Overall balance assessment: Needs assistance;History of Falls Sitting-balance support: Feet supported Sitting balance-Leahy Scale: Poor Sitting balance - Comments: pt progressing from max A to very close min guard to sit EOB Postural control: Posterior lean Standing balance support: During functional activity;Bilateral upper extremity supported Standing balance-Leahy Scale: Poor Standing balance comment: pt reliant on bilateral UEs on RW and close min guard                             Pertinent Vitals/Pain Pain Assessment: No/denies pain    Home Living Family/patient expects to be discharged to:: Private residence Living Arrangements: Spouse/significant other Available Help at Discharge: Family;Available 24 hours/day Type of Home: House Home Access: Level entry     Home Layout: Two  level;Able to live on main level with bedroom/bathroom Home Equipment: Gilford Rile - 2 wheels;Cane - single point;Bedside commode;Shower seat;Wheelchair - manual;Hospital bed      Prior Function Level of  Independence: Needs assistance   Gait / Transfers Assistance Needed: husband assisting with mobility; hold her arm and walks with her  ADL's / Homemaking Assistance Needed: husband assisting with all ADL; reports he basically picks pt up and puts her in bath tub so she doesnt fall        Hand Dominance        Extremity/Trunk Assessment   Upper Extremity Assessment Upper Extremity Assessment: Defer to OT evaluation    Lower Extremity Assessment Lower Extremity Assessment: Generalized weakness    Cervical / Trunk Assessment Cervical / Trunk Assessment: Kyphotic  Communication   Communication: No difficulties  Cognition Arousal/Alertness: Awake/alert Behavior During Therapy: Flat affect Overall Cognitive Status: History of cognitive impairments - at baseline                                 General Comments: pt with dementia at baseline      General Comments      Exercises     Assessment/Plan    PT Assessment Patient needs continued PT services  PT Problem List Decreased strength;Decreased balance;Decreased mobility;Decreased coordination;Decreased cognition;Decreased knowledge of use of DME;Decreased safety awareness       PT Treatment Interventions DME instruction;Gait training;Stair training;Therapeutic activities;Functional mobility training;Balance training;Therapeutic exercise;Neuromuscular re-education;Patient/family education    PT Goals (Current goals can be found in the Care Plan section)  Acute Rehab PT Goals Patient Stated Goal: return home PT Goal Formulation: With patient/family Time For Goal Achievement: 02/08/17 Potential to Achieve Goals: Good    Frequency Min 3X/week   Barriers to discharge        Co-evaluation PT/OT/SLP Co-Evaluation/Treatment: Yes Reason for Co-Treatment: Necessary to address cognition/behavior during functional activity;For patient/therapist safety;To address functional/ADL transfers PT goals addressed  during session: Mobility/safety with mobility;Balance;Proper use of DME;Strengthening/ROM         AM-PAC PT "6 Clicks" Daily Activity  Outcome Measure Difficulty turning over in bed (including adjusting bedclothes, sheets and blankets)?: A Lot Difficulty moving from lying on back to sitting on the side of the bed? : Total Difficulty sitting down on and standing up from a chair with arms (e.g., wheelchair, bedside commode, etc,.)?: Total Help needed moving to and from a bed to chair (including a wheelchair)?: A Little Help needed walking in hospital room?: A Little Help needed climbing 3-5 steps with a railing? : A Lot 6 Click Score: 12    End of Session Equipment Utilized During Treatment: Gait belt Activity Tolerance: Patient tolerated treatment well Patient left: in bed;with call bell/phone within reach;with bed alarm set;with family/visitor present Nurse Communication: Mobility status PT Visit Diagnosis: Unsteadiness on feet (R26.81);Other abnormalities of gait and mobility (R26.89);Repeated falls (R29.6);History of falling (Z91.81)    Time: 1610-9604 PT Time Calculation (min) (ACUTE ONLY): 25 min   Charges:   PT Evaluation $PT Eval Moderate Complexity: 1 Procedure     PT G Codes:        Sherie Don, PT, DPT [place] 01/25/2017, 12:08 PM

## 2017-01-25 NOTE — Progress Notes (Signed)
STROKE TEAM PROGRESS NOTE   SUBJECTIVE (INTERVAL HISTORY)  Patient is accompanied by her husband at bedside. She remains stable. Repeat CT head .MRA brain shows no aneurysm.  OBJECTIVE Temp:  [98 F (36.7 C)-98.8 F (37.1 C)] 98 F (36.7 C) (06/02 1158) Pulse Rate:  [47-59] 47 (06/02 1158) Cardiac Rhythm: Normal sinus rhythm;Heart block (06/02 0737) Resp:  [11-21] 11 (06/02 1158) BP: (104-157)/(48-62) 124/48 (06/02 1158) SpO2:  [94 %-100 %] 94 % (06/02 1158)  CBC:   Recent Labs Lab 01/22/17 1331  WBC 6.1  NEUTROABS 4.0  HGB 11.0*  HCT 33.7*  MCV 95.2  PLT 951    Basic Metabolic Panel:   Recent Labs Lab 01/22/17 1331  NA 138  K 4.2  CL 102  CO2 29  GLUCOSE 149*  BUN 21*  CREATININE 1.76*  CALCIUM 9.4    Lipid Panel:     Component Value Date/Time   CHOL 230 (H) 11/16/2015 0240   TRIG 97 11/16/2015 0240   HDL 68 11/16/2015 0240   CHOLHDL 3.4 11/16/2015 0240   VLDL 19 11/16/2015 0240   LDLCALC 143 (H) 11/16/2015 0240    PHYSICAL EXAM Frail elderly Caucasian lady currently not in distress. . Afebrile. Head is nontraumatic. Neck is supple without bruit.    Cardiac exam no murmur or gallop. Lungs are clear to auscultation. Distal pulses are well felt. Right temporal ecchymosis Neurological Exam ;  Awake  Alert oriented to person only. Diminished attention, registration and recall. Poor insight into her condition. Easy distractibility. Normal speech and language.eye movements full without nystagmus.fundi were not visualized. Vision acuity and fields appear normal. Hearing is normal. Palatal movements are normal. Face symmetric. Tongue midline. Normal strength, tone, reflexes and coordination. Normal sensation. Gait deferred.  ASSESSMENT/PLAN [name] is a 83 y.o. female with history of HTN, DM, CAD, aneurysm and fall today where she hit her head, found incontinent in setting of carbon monoxide. CT shows  small volume subarachnoid hemorrhage in the  right quadrigeminal plate cistern; Small volume hemorrhage in the occipital horn right lateral ventricle and septum pellucidum.   Traumatic SAH  NS consult. Non-surgical  Resultant  no focal deficits  CT head 5/30 1445 small R quadrigeminal plate SAH with hemorrhage in R occipital horn and septum pellucidum. Extensive remote ischemic injury  CT spine no fx  MRA head pending   SCDs for VTE prophylaxis Diet regular Room service appropriate? 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 Followed by Neurologist: Dr. Salena Cook at [facility]. Last seen 04/2002. On Aggrenox and niacin at that time.  Hx Cerebral aneurysm per   Coronary artery disease s/p stents  Obstructive sleep apnea, stopped CPAP d/t sinus issues  Chronic combined systolic and diastolic CHF  Other Active Problems  Hx dementia on aricept  CKD stage III  GERD  Hospital day # 1   .  Long discussion of the bedside with the patient and husband and answered questions.Stroke team will sign off Discussed with Dr. Candiss Cook.   Mary Contras, MD Medical Director [facility] Pager: [phone] 01/25/2017 12:16 PM   To contact Stroke Continuity provider, please refer to [email or web address] After hours, contact General Neurology

## 2017-01-25 NOTE — Progress Notes (Signed)
Discharge instructions given to patient and patients husband, all questions answered at this time.  Prescriptions given to patients husband.  Pt. VSS with no s/s of distress noted.  Patient stable at discharge.

## 2017-01-25 NOTE — Progress Notes (Signed)
MRA head revealed vasospasm of the P1 segment of the right PCA, in the region of the quadrigeminal cistern subarachnoid hemorrhage. No aneurysm seen. Nimodipine to treat/prevent vasospasm is indicated. Nimodipine 60 mg po q4h x 21 days has been ordered. Discussed with Dr. Candiss Norse.   Electronically signed: Dr. Kerney Elbe

## 2017-01-25 NOTE — Discharge Summary (Addendum)
Mary Cook QDU:[id number] DOB: 13-Sep-1925 DOA: 01/22/2017  PCP: Cassandria Anger, MD  Admit date: 01/22/2017  Discharge date: 01/25/2017  Admitted From: Home   Disposition:  Home   Recommendations for Outpatient Follow-up:   Follow up with PCP in 1-2 weeks  PCP Please obtain BMP/CBC, 2 view CXR in 1week,  (see Discharge instructions)   PCP Please follow up on the following pending results: None   Home Health: PT,RN   Equipment/Devices: None  Consultations: Called Neurosurgery, trauma and neurology, Also discussed with neuro IR doctor Deveshwar Discharge Condition: Stable   CODE STATUS: DNR   Diet Recommendation: Heart Healthy - Low Carb   Chief Complaint  Patient presents with  . Altered Mental Status     Brief history of present illness from the day of admission and additional interim summary    Mary M Shropshireis an 83 y.o.femalewith hx of dementia, but generally high functional, ambulate with assistance from her husband, hx of HTN, DM2, CAD s/p stents, fell today and hit her head. She did not lose consciousness. CT of the head in the ER showed small subarachnoid hemorrhage in the right quadrigeminal plate cistern.and inthe occipital horn right lateral ventricle and septum pellucidum with extensive remote ischemic injury. Her neck CT showed no acute Fx. Neurosurgery was consulted, and deemed non surgical. [facility] was consulted for admission, and deferred to hospitalist admission. Her Cr was found to be 1.7, and her platelet count was normal. She has been on ASA, but not full anticoagulation.                                                                    Cook Course    1. Mechanical fall with traumatic subarachnoid hemorrhage. Neurosurgery, trauma and  neurology all called and informed, neurology Formally saw the patient, she was also seen by PT OT, mental status stable no focal deficits, no headache. Per neuro we obtained an MRA brain, case discussed with neuro IR as well. MRA brain was done as well with no signs of aneurysm but possible post subarachnoid hemorrhage vasospasm, she was placed on limited pain for 21 days, now symptom-free will be discharged home with home PT and RN. Will follow with neurology and PCP outpatient within 1-2 weeks after discharge..  2. CK D3. Currently at baseline of creatinine around 1.8.  3. Chronic systolic CHF EF around 37-54%. Currently on combination of Coreg, Imdur, Entresto and Lasix which will be continued. Clinically compensated.  Addendum soon after discharge when patient was getting out of the bed her heart rate dropped into the high 30s low 40s, blood pressure dropped a little bit as well, discussed her case with Dr. Sallyanne Kuster cardiologist on call. At this time we will stop patient's digoxin, cut home dose Coreg  in half, gentle IV fluid bolus, monitor heart rate and chonotropic response to activity. If all stable discharge with 30 day event monitor and outpatient follow-up with primary cardiologist Dr. Claiborne Billings within a week.  4. Hypertension. Stable on combination of Norvasc, Coreg, Imdur, Entresto and diuretic. For 21 days Norvasc has been held as she is on [place] to pain thereafter PCP to resume.  5. CAD. No acute issues aspirin on hold due to #1 above, continue beta blocker and Imdur.  6. GERD. Continue PPI.  7. DM type II. Resume home regimen.   Discharge diagnosis     Active Problems:   Diabetes mellitus ([place])   Essential hypertension   Sleep apnea, cpap had been stopped secondary to "sinus issues"   Falls frequently   Chronic kidney disease stage III   Cardiomyopathy, ischemic-EF 30-35% by echo 7/0/01   Chronic systolic CHF (congestive heart failure) (HCC)   ICH (intracerebral  hemorrhage) (HCC)   Traumatic subarachnoid hemorrhage with loss of consciousness of 30 minutes or less Mary Cook)    Discharge instructions    Discharge Instructions    Discharge instructions    Complete by:  As directed    You're taking a new blood pressure medication, Nimodipine pain for 21 days, after 21 days stop this medicine and go back to Norvasc as before.   Follow with Primary MD Plotnikov, Evie Lacks, MD in 7 days   Get CBC, CMP, 2 view Chest X ray checked  by Primary MD or SNF MD in 5-7 days ( we routinely change or add medications that can affect your baseline labs and fluid status, therefore we recommend that you get the mentioned basic workup next visit with your PCP, your PCP may decide not to get them or add new tests based on their clinical decision)  Activity: As tolerated with Full fall precautions use walker/cane & assistance as needed  Disposition Home   Diet: Heart Healthy  Low Carb  For Heart failure patients - Check your Weight same time everyday, if you gain over 2 pounds, or you develop in leg swelling, experience more shortness of breath or chest pain, call your Primary MD immediately. Follow Cardiac Low Salt Diet and 1.5 lit/day fluid restriction.  On your next visit with your primary care physician please Get Medicines reviewed and adjusted.  Please request your Prim.MD to go over all Cook Tests and Procedure/Radiological results at the follow up, please get all Cook records sent to your Prim MD by signing Cook release before you go home.  If you experience worsening of your admission symptoms, develop shortness of breath, life threatening emergency, suicidal or homicidal thoughts you must seek medical attention immediately by calling 911 or calling your MD immediately  if symptoms less severe.  You Must read complete instructions/literature along with all the possible adverse reactions/side effects for all the Medicines you take and that have been  prescribed to you. Take any new Medicines after you have completely understood and accpet all the possible adverse reactions/side effects.   Do not drive, operate heavy machinery, perform activities at heights, swimming or participation in water activities or provide baby sitting services if your were admitted for syncope or siezures until you have seen by Primary MD or a Neurologist and advised to do so again.  Do not drive when taking Pain medications.    Do not take more than prescribed Pain, Sleep and Anxiety Medications  Special Instructions: If you have smoked or chewed Tobacco  in the  last 2 yrs please stop smoking, stop any regular Alcohol  and or any Recreational drug use.  Wear Seat belts while driving.   Please note  You were cared for by a hospitalist during your Cook stay. If you have any questions about your discharge medications or the care you received while you were in the Cook after you are discharged, you can call the unit and asked to speak with the hospitalist on call if the hospitalist that took care of you is not available. Once you are discharged, your primary care physician will handle any further medical issues. Please note that NO REFILLS for any discharge medications will be authorized once you are discharged, as it is imperative that you return to your primary care physician (or establish a relationship with a primary care physician if you do not have one) for your aftercare needs so that they can reassess your need for medications and monitor your lab values.   Increase activity slowly    Complete by:  As directed       Discharge Medications   Allergies as of 01/25/2017      Reactions   Clopidogrel Bisulfate Nausea And Vomiting   Patient is not aware of this allergy   Lipitor [atorvastatin] Other (See Comments)   Weak legs   Namenda [memantine Hcl]    N/v/d   Lisinopril Cough   Patient is not aware of this allergy      Medication List    STOP  taking these medications   amLODipine 5 MG tablet Commonly known as:  NORVASC   digoxin 0.125 MG tablet Commonly known as:  LANOXIN     TAKE these medications   ACCU-CHEK AVIVA PLUS w/Device Kit Use to check blood sugars twice a day Dx. E11.9   aspirin 81 MG EC tablet Take 81 mg by mouth daily.   b complex vitamins tablet Take 1 tablet by mouth daily.   carvedilol 6.25 MG tablet Commonly known as:  COREG Take 1 tablet (6.25 mg total) by mouth 2 (two) times daily with a meal. What changed:  medication strength  how much to take  how to take this  when to take this  additional instructions   citalopram 20 MG tablet Commonly known as:  CELEXA TAKE 1 TABLET EVERY DAY   donepezil 5 MG tablet Commonly known as:  ARICEPT Take 1 tablet (5 mg total) by mouth daily.   furosemide 40 MG tablet Commonly known as:  LASIX Take 0.5 tablets (20 mg total) by mouth daily.   isosorbide mononitrate 60 MG 24 hr tablet Commonly known as:  IMDUR Take 1 tablet (60 mg total) by mouth daily.   metFORMIN 500 MG tablet Commonly known as:  GLUCOPHAGE Take 1 tablet (500 mg total) by mouth 2 (two) times daily with a meal. Start taking on:  01/26/2017   niMODipine 30 MG capsule Commonly known as:  NIMOTOP Take 2 capsules (60 mg total) by mouth every 4 (four) hours.   omeprazole 20 MG capsule Commonly known as:  PRILOSEC Take 20 mg by mouth daily.   potassium chloride SA 20 MEQ tablet Commonly known as:  KLOR-CON M20 Take 1 tablet (20 mEq total) by mouth daily.   sacubitril-valsartan 49-51 MG Commonly known as:  ENTRESTO Take 1 tablet by mouth 2 (two) times daily.       Follow-up Information    Plotnikov, Evie Lacks, MD. Schedule an appointment as soon as possible for a visit in 1 week(s).  Specialty:  Internal Medicine Contact information: [place] [address] [phone]        Garvin Fila, MD. Schedule an appointment as soon as possible for a  visit in 1 month(s).   Specialties:  Neurology, Radiology Contact information: [address] [place] [address] [phone]        [facility] Follow up.   Why:  home health agency will call you to schedule your at home visits. Contact information: [address] [place] [address] [phone]        Troy Sine, MD Follow up.   Specialty:  Cardiology Contact information: [address] [place] [address] [phone]           Major procedures and Radiology Reports - PLEASE review detailed and final reports thoroughly  -        Dg Chest 2 View  Result Date: 01/22/2017 CLINICAL DATA:  Initial evaluation for acute altered mental status, unresponsive. EXAM: CHEST  2 VIEW COMPARISON:  Prior CT from 06/19/2016. FINDINGS: Mild cardiomegaly, stable. Mediastinal silhouette within normal limits. Aortic atherosclerosis. Lungs mildly hypoinflated. No focal infiltrates. Mild perihilar vascular congestion like related hypoinflation. No pulmonary edema. No pleural effusion. No pneumothorax. No acute osseus abnormality. IMPRESSION: 1. No active cardiopulmonary disease. 2. Stable cardiomegaly without pulmonary edema. 3. Aortic atherosclerosis. Electronically Signed   By: Jeannine Boga M.D.   On: 01/22/2017 14:23   Ct Head Wo Contrast  Result Date: 01/24/2017 CLINICAL DATA:  Follow-up subarachnoid hemorrhage. EXAM: CT HEAD WITHOUT CONTRAST TECHNIQUE: Contiguous axial images were obtained from the base of the skull through the vertex without intravenous contrast. COMPARISON:  Head CT from 2 days ago FINDINGS: Brain: Essentially stable small volume layering hemorrhage in the occipital horn of the right lateral ventricle. Stable small volume subarachnoid hemorrhage in the right quadrigeminal plate cistern. Septum pellucidum hemorrhage has diminished/ resolved. Atrophy with ventriculomegaly, greater on the right due to  remote right temporal parietal infarct. Diffuse chronic small vessel ischemic gliosis in the cerebral white matter. Remote lateral lenticulostriate infarct on the right. Remote small inferior left cerebellar infarct. Small remote high right parietal cortex infarct. Vascular: Atherosclerotic calcification. Skull: No acute finding Sinuses/Orbits: No acute finding IMPRESSION: 1. Stable small volume subarachnoid and intraventricular hemorrhage. Septum pellucidum hemorrhage has resolved/decreased. 2. Advanced chronic ischemic injury. Electronically Signed   By: Monte Fantasia M.D.   On: 01/24/2017 16:56   Ct Head Wo Contrast  Result Date: 01/22/2017 CLINICAL DATA:  Fall on ground. Minimally responsive. Initial encounter. EXAM: CT HEAD WITHOUT CONTRAST CT CERVICAL SPINE WITHOUT CONTRAST TECHNIQUE: Multidetector CT imaging of the head and cervical spine was performed following the standard protocol without intravenous contrast. Multiplanar CT image reconstructions of the cervical spine were also generated. COMPARISON:  08/01/2014 FINDINGS: CT HEAD FINDINGS Brain: Small volume subarachnoid hemorrhage in the quadrigeminal plate cistern on the right. Trace hemorrhage seen in the occipital horn of the right lateral ventricle and in the septum pellucidum (the latter measuring 5 mm in maximal dimension). Remote infarct in the right parietal and occipital lobe with ex vacuo right lateral ventricular enlargement. Remote lateral lenticulostriate distribution infarct on the right. Extensive chronic small vessel ischemic gliosis in the cerebral white matter. Probable remote lacunar infarct in the left thalamus. Small remote inferior left cerebellar infarct. Vascular: Atherosclerotic calcification. Skull: Negative for fracture Sinuses/Orbits: No acute finding.  Bilateral cataract resection. Other: Critical Value/emergent results were called by telephone at  the time of interpretation on 01/22/2017 at 2:42 pm to Dr. Brantley Stage , who  verbally acknowledged these results. CT CERVICAL SPINE FINDINGS Alignment: No traumatic malalignment. Facet mediated anterolisthesis at C3-4 and C4-5, mild. Skull base and vertebrae: No acute fracture. Soft tissues and spinal canal: No prevertebral fluid or swelling. No visible canal hematoma. Disc levels: Diffuse degenerative facet spurring. C5-6 and C6-7 predominant disc degeneration. No suspected degenerative cord impingement. Upper chest: No acute finding IMPRESSION: 1. Small volume subarachnoid hemorrhage in the right quadrigeminal plate cistern. 2. Small volume hemorrhage in the occipital horn right lateral ventricle and septum pellucidum. 3. Negative for cervical spine fracture. 4. Extensive remote ischemic injury. Electronically Signed   By: Monte Fantasia M.D.   On: 01/22/2017 14:45   Ct Cervical Spine Wo Contrast  Result Date: 01/22/2017 CLINICAL DATA:  Fall on ground. Minimally responsive. Initial encounter. EXAM: CT HEAD WITHOUT CONTRAST CT CERVICAL SPINE WITHOUT CONTRAST TECHNIQUE: Multidetector CT imaging of the head and cervical spine was performed following the standard protocol without intravenous contrast. Multiplanar CT image reconstructions of the cervical spine were also generated. COMPARISON:  08/01/2014 FINDINGS: CT HEAD FINDINGS Brain: Small volume subarachnoid hemorrhage in the quadrigeminal plate cistern on the right. Trace hemorrhage seen in the occipital horn of the right lateral ventricle and in the septum pellucidum (the latter measuring 5 mm in maximal dimension). Remote infarct in the right parietal and occipital lobe with ex vacuo right lateral ventricular enlargement. Remote lateral lenticulostriate distribution infarct on the right. Extensive chronic small vessel ischemic gliosis in the cerebral white matter. Probable remote lacunar infarct in the left thalamus. Small remote inferior left cerebellar infarct. Vascular: Atherosclerotic calcification. Skull: Negative for fracture  Sinuses/Orbits: No acute finding.  Bilateral cataract resection. Other: Critical Value/emergent results were called by telephone at the time of interpretation on 01/22/2017 at 2:42 pm to Dr. Brantley Stage , who verbally acknowledged these results. CT CERVICAL SPINE FINDINGS Alignment: No traumatic malalignment. Facet mediated anterolisthesis at C3-4 and C4-5, mild. Skull base and vertebrae: No acute fracture. Soft tissues and spinal canal: No prevertebral fluid or swelling. No visible canal hematoma. Disc levels: Diffuse degenerative facet spurring. C5-6 and C6-7 predominant disc degeneration. No suspected degenerative cord impingement. Upper chest: No acute finding IMPRESSION: 1. Small volume subarachnoid hemorrhage in the right quadrigeminal plate cistern. 2. Small volume hemorrhage in the occipital horn right lateral ventricle and septum pellucidum. 3. Negative for cervical spine fracture. 4. Extensive remote ischemic injury. Electronically Signed   By: Monte Fantasia M.D.   On: 01/22/2017 14:45   Mr Jodene Nam Head Wo Contrast  Result Date: 01/25/2017 CLINICAL DATA:  Subarachnoid hemorrhage.  Recent fall. EXAM: MRA HEAD WITHOUT CONTRAST TECHNIQUE: Angiographic images of the Circle of Willis were obtained using MRA technique without intravenous contrast. COMPARISON:  Head CT 01/24/2017 FINDINGS: Intracranial internal carotid arteries: Normal. Anterior cerebral arteries: Normal. Middle cerebral arteries: Normal. Posterior communicating arteries: Present bilaterally. Posterior cerebral arteries: There is irregularity of the right PCA P1 segment with moderate to severe stenosis at the right P1/P2 junction. Left PCA is normal. Basilar artery: Normal. Vertebral arteries: Left dominant. Normal. Superior cerebellar arteries: Normal. Anterior inferior cerebellar arteries: Normal. Posterior inferior cerebellar arteries: Normal. Small amount of subarachnoid blood in the right quadrigeminal cistern is again noted. There is also  blood layering in the right occipital horn. IMPRESSION: 1. Irregularity of the right PCA P1 segment with moderate to severe stenosis at the P1-P2 junction may indicate vasospasm in the setting of  subarachnoid hemorrhage. Otherwise normal MRA. 2. Unchanged subarachnoid blood in the right quadrigeminal cistern and right occipital horn. Electronically Signed   By: Ulyses Jarred M.D.   On: 01/25/2017 01:20    Micro Results     Recent Results (from the past 240 hour(s))  MRSA PCR Screening     Status: None   Collection Time: 01/23/17 12:00 AM  Result Value Ref Range Status   MRSA by PCR NEGATIVE NEGATIVE Final    Today   Subjective    Talecia Stephens today has no headache,no chest abdominal pain,no new weakness tingling or numbness, feels much better wants to go home today    Objective   Blood pressure (!) 157/58, pulse (!) 56, temperature 98.2 F (36.8 C), temperature source Oral, resp. rate 11, height _0  (1.651 m), weight 69.4 kg (153 lb), SpO2 94 %.   Intake/Output Summary (Last 24 hours) at 01/25/17 0928 Last data filed at 01/25/17 0400  Gross per 24 hour  Intake              240 ml  Output                0 ml  Net              240 ml    Exam Awake Oriented x 2, No new F.N deficits, Normal affect [place].AT,PERRAL Supple Neck,No JVD, No cervical lymphadenopathy appriciated.  Symmetrical Chest wall movement, Good air movement bilaterally, CTAB RRR,No Gallops,Rubs or new Murmurs, No Parasternal Heave +ve B.Sounds, Abd Soft, Non tender, No organomegaly appriciated, No rebound -guarding or rigidity. No Cyanosis, Clubbing or edema, No new Rash or bruise   Data Review   CBC w Diff:  Lab Results  Component Value Date   WBC 6.1 01/22/2017   HGB 11.0 (L) 01/22/2017   HCT 33.7 (L) 01/22/2017   PLT 179 01/22/2017   LYMPHOPCT 25 01/22/2017   MONOPCT 9 01/22/2017   EOSPCT 2 01/22/2017   BASOPCT 0 01/22/2017    CMP:  Lab Results  Component Value Date   NA 138 01/22/2017     K 4.2 01/22/2017   CL 102 01/22/2017   CO2 29 01/22/2017   BUN 21 (H) 01/22/2017   CREATININE 1.76 (H) 01/22/2017   CREATININE 1.55 (H) 08/13/2016   PROT 7.2 01/22/2017   ALBUMIN 3.7 01/22/2017   BILITOT 0.6 01/22/2017   ALKPHOS 60 01/22/2017   AST 17 01/22/2017   ALT 13 (L) 01/22/2017  .   Total Time in preparing paper work, data evaluation and todays exam - 25 minutes  Lala Lund M.D on 01/25/2017 at 9:28 AM  [facility]   Office  [phone]

## 2017-01-25 NOTE — Discharge Instructions (Signed)
Head Injury, Adult There are many types of head injuries. Head injuries can be as minor as a bump, or they can be more severe. More severe head injuries include:  A jarring injury to the brain (concussion).  A bruise of the brain (contusion). This means there is bleeding in the brain that can cause swelling.  A cracked skull (skull fracture).  Bleeding in the brain that collects, clots, and forms a bump (hematoma).  After a head injury, you may need to be observed for a while in the emergency department or urgent care. Sometimes admission to the hospital is needed. After a head injury has happened, most problems occur within the first 24 hours, but side effects may occur up to 7-10 days after the injury. It is important to watch your condition for any changes. What are the causes? There are many possible causes of a head injury. A serious head injury may happen to someone who is in a car accident (motor vehicle collision). Other causes of major head injuries include bicycle or motorcycle accidents, sports injuries, and falls. Risk factors This condition is more likely to occur in people who:  Drink a lot of alcohol or use drugs.  Are over the age of 7.  Are at risk for falls.  What are the symptoms? There are many possible symptoms of a head injury. Visible symptoms of a head injury include a bruise, bump, or bleeding at the site of the injury. Other non-visible symptoms include:  Feeling sleepy or not being able to stay awake.  Passing out.  Headache.  Seizures.  Dizziness.  Confusion.  Memory problems.  Nausea or vomiting.  Other possible symptoms that may develop after the head injury include:  Poor attention and concentration.  Fatigue or tiring easily.  Irritability.  Being uncomfortable around bright lights or loud noises.  Anxiety or depression.  Disturbed sleep.  How is this diagnosed? This condition can usually be diagnosed based on your  symptoms, a description of the injury, and a physical exam. You may also have imaging tests done, such as a CT scan or MRI. You will also be closely watched. How is this treated? Treatment for this condition depends on the severity and type of injury you have. The main goal of treatment is to prevent complications and allow the brain time to heal. For mild head injury, you may be sent home and treatment may include:  Observation. A responsible adult should stay with you for 24 hours after your injury and check on you often.  Physical rest.  Brain rest.  Pain medicines.  For severe brain injury, treatment may include:  Close observation. This includes hospitalization with frequent physical exams. You may need to go to a hospital that specializes in head injury.  Pain medicines.  Breathing support. This may include using a ventilator.  Managing the pressure inside the brain (intracranial pressure, or ICP). This may include: ? Monitoring the ICP. ? Giving medicines to decrease the ICP. ? Positioning you to decrease the ICP.  Medicine to prevent seizures.  Surgery to stop bleeding or to remove blood clots (craniotomy).  Surgery to remove part of the skull (decompressive craniectomy). This allows room for the brain to swell.  Follow these instructions at home: Activity  Rest as much as possible and avoid activities that are physically hard or tiring.  Make sure you get enough sleep.  Limit activities that require a lot of thought or attention, such as: ? Watching TV. ?  Playing memory games and puzzles. ? Job-related work or homework. ? Working on [employer], and texting.  Avoid activities that could cause another head injury, such as playing sports, until your health care provider approves. Having another head injury, especially before the first one has healed, can be dangerous.  Ask your health care provider when it is safe for you to return to your regular  activities, including work or school. Ask your health care provider for a step-by-step plan for gradually returning to activities.  Ask your health care provider when you can drive, ride a bicycle, or use heavy machinery. Your ability to react may be slower after a brain injury. Never do these activities if you are dizzy.  Lifestyle  Do not drink alcohol until your health care provider approves, and avoid drug use. Alcohol and certain drugs may slow your recovery and can put you at risk of further injury.  If it is harder than usual to remember things, write them down.  If you are easily distracted, try to do one thing at a time.  Talk with family members or close friends when making important decisions.  Tell your friends, family, a trusted colleague, and work Freight forwarder about your injury, symptoms, and restrictions. Have them watch for any new or worsening problems.  General instructions  Take over-the-counter and prescription medicines only as told by your health care provider.  Have someone stay with you for 24 hours after your head injury. This person should watch you for any changes in your symptoms and be ready to seek medical help, as needed.  Keep all follow-up visits as told by your health care provider. This is important.  Prevention  Work on improving your balance and strength to avoid falls.  Wear a seatbelt when you are in a moving vehicle.  Wear a helmet when riding a bicycle, skiing, or doing any other sport or activity that has a risk of injury.  Drink alcohol only in moderation.  Take safety measures in your home, such as: ? Removing clutter and tripping hazards from floors and stairways. ? Using grab bars in bathrooms and handrails by stairs. ? Placing non-slip mats on floors and in bathtubs. ? Improving lighting in dim areas. Get help right away if:  You have: ? A severe headache that is not helped by medicine. ? Trouble walking, have weakness in your arms  and legs, or lose your balance. ? Clear or bloody fluid coming from your nose or ears. ? Changes in your vision. ? A seizure.  You vomit.  Your symptoms get worse.  Your speech is slurred.  You pass out.  You are sleepier and have trouble staying awake.  Your pupils change size. These symptoms may represent a serious problem that is an emergency. Do not wait to see if the symptoms will go away. Get medical help right away. Call your local emergency services (911 in the [place] Do not drive yourself to the hospital. This information is not intended to replace advice given to you by your health care provider. Make sure you discuss any questions you have with your health care provider. Document Released: 08/12/2005 Document Revised: 03/08/2016 Document Reviewed: 02/20/2016 Elsevier Interactive Patient Education  2017 [employer].   Subdural Hematoma A subdural hematoma is a collection of blood between the brain and its tough outer covering (dura). As the amount of blood increases, it puts pressure on the brain. There are two types of subdural hematomas:  Acute. This  type develops shortly after a hard, direct hit (blow) to the head and causes blood to collect very quickly. This is a medical emergency. If it is not diagnosed and treated quickly, it can lead to severe brain injury or death.  Chronic. This is when bleeding develops more slowly, over weeks or months.  What are the causes? This condition is caused by bleeding (hemorrhage) from a broken (ruptured) blood vessel. In most cases, a blood vessel ruptures and bleeds because of injury (trauma) to the head, such as from a hard, direct hit. Head trauma can happen in:  Traffic accidents.  Falls.  Assaults.  Sport injuries.  In rare cases, hemorrhage can happen without a known cause (spontaneously), especially if you take blood thinners (anticoagulants). What increases the risk? This condition is more likely to develop  in:  Older people.  Infants.  People who take blood thinners.  People who have injured their head.  People who abuse alcohol.  What are the signs or symptoms? Depending on the size of the hematoma, symptoms can vary from mild to severe and life-threatening. Symptoms in acute subdural hematoma can develop over minutes or hours. Symptoms in chronic subdural hematoma may develop over weeks or months.  Headaches.  Nausea or vomiting.  Changes in vision, such as double vision or loss of vision.  Changes in speech.  Loss of balance or difficulty walking.  Weakness, numbness, or tingling in the arms or legs on one side of the body.  Jerky movements that you cannot control (seizures).  Change in personality.  Increased sleepiness.  Memory loss.  Loss of consciousness.  Coma.  How is this diagnosed? This condition is diagnosed based on the results of:  A physical and neurological exam.  CT scan.  MRI.  How is this treated? Treatment for this condition depends on the severity and the type of subdural hematoma that you have. You may need to temporarily stop taking blood thinners, if this applies. You may be given antiseizure (anticonvulsant) medicine. Treatment for acute subdural hematoma may include:  Medicines that help the body get rid of excess fluids (diuretics). These may help reduce pressure in the brain.  Assisted breathing (ventilation). This involves using a machine called a ventilator to help you breathe. This helps to reduce pressure in the brain, especially if there is swelling of the brain.  Emergency surgery to drain blood or remove a blood clot.  Treatment for chronic subdural hematoma may include:  Observation and bed rest at the hospital.  Emergency surgery. This may be done if the bleeding is large, or if you have neurological symptoms such as weakness or numbness.  Sometimes, no treatment is needed for chronic subdural hematoma. Follow these  instructions at home: Activity  Avoid any situation where there is potential for another head injury, such as football, hockey, soccer, basketball, martial arts, downhill snow sports, and horseback riding. Do not do these activities until your health care provider approves. ? If you play a contact sport and you experience a head injury, follow advice from your health care provider about when you can return to the sport. If you get another injury while you are healing, you may experience another hemorrhage.  Avoid excessive visual stimulation while recovering. This includes working on the computer, watching TV, and reading.  Try to avoid activities that cause physical or mental stress. Stay home from work or school as directed by your health care provider.  Do not drive, ride a bicycle, or use  heavy machinery until your health care provider approves.  Do not lift anything that is heavier than 5 lb (2.3 kg) until your health care provider approves.  If physical therapy was prescribed, do exercises as told by your health care provider or physical therapist.  Rest as told by your health care provider. Rest helps the brain to heal.  Make sure you: ? Get plenty of sleep. Avoid staying up late at night. ? Keep a consistent sleep schedule. Try to go to sleep and wake up at about the same time every day. General instructions  Recovery from brain injuries varies widely. Talk with your health care provider about what to expect. Monitor your symptoms, and ask people around you to do the same.  Take over-the-counter and prescription medicines only as told by your health care provider. Do not take blood thinners or NSAIDs unless your health care provider approves. This includes aspirin, ibuprofen, naproxen, and warfarin.  Limit alcohol intake to no more than 1 drink per day for nonpregnant women and 2 drinks per day for men. One drink equals 12 oz of beer, 5 oz of wine, or 1 oz of hard liquor.  Keep  all follow-up visits as told by your health care provider. This is important. How is this prevented?  Wear protective gear, such as helmets, when participating in activities such as biking or contact sports.  Always wear a seat belt when you are in a motor vehicle.  Keep your home environment safe to reduce the risk of falling: ? Remove clutter and tripping hazards from floors and stairways, such as loose rugs and extension cords. ? Use grab bars in bathrooms and handrails by stairs. ? Place non-slip mats on floors and in bathtubs. ? Improve lighting in dim areas. Where to find more information:  [employer] of Neurological Disorders and Stroke: MasterBoxes.it  American Association of Neurological Surgeons: http://www.aans.org  American Academy of Neurology (AAN): http://keith.biz/  Brain Injury Association of America: www.biausa.org Get help right away if:  You develop symptoms of subdural hematoma.  You are taking blood thinners and you fall or you experience minor trauma to the head. If you take any blood thinners, even a very small injury can cause a subdural hematoma. You should get help right away, even if you think your symptoms are mild.  You have a bleeding disorder and you fall or you experience minor trauma to the head.  You experience a head injury and you develop any of the following symptoms: ? Clear fluid draining from your nose or ears. ? Nausea. ? Vomiting. ? Slurred speech. ? Seizures. ? Drowsiness or a decrease in alertness. ? Double vision. ? Numbness or inability to move (paralysis) in any part of your body. ? Difficulty walking or poor coordination. ? Difficulty thinking. ? Confusion or forgetfulness. ? Personality changes. ? Irrational or aggressive behavior. ? A history of heavy alcohol use. These symptoms may represent a serious problem that is an emergency. Do not wait to see if the symptoms will go away. Get medical help right away. Call your  local emergency services (911 in the [place] Do not drive yourself to the hospital. Summary  A subdural hematoma is a collection of blood between the brain and its tough outer covering.  Treatment for this condition depends on the severity and the type of subdural hemorrhage that you have.  Symptoms can vary from mild to severe and life-threatening.  Monitor your symptoms, and ask others around you to do the  same. This information is not intended to replace advice given to you by your health care provider. Make sure you discuss any questions you have with your health care provider. Document Released: 06/29/2004 Document Revised: 07/17/2016 Document Reviewed: 07/17/2016 Elsevier Interactive Patient Education  2018 [place] taking a new blood pressure medication, Nimodipine pain for 21 days, after 21 days stop this medicine and go back to Norvasc as before.   Follow with Primary MD Plotnikov, Evie Lacks, MD in 7 days   Get CBC, CMP, 2 view Chest X ray checked  by Primary MD or SNF MD in 5-7 days ( we routinely change or add medications that can affect your baseline labs and fluid status, therefore we recommend that you get the mentioned basic workup next visit with your PCP, your PCP may decide not to get them or add new tests based on their clinical decision)  Activity: As tolerated with Full fall precautions use walker/cane & assistance as needed  Disposition Home   Diet: Heart Healthy  Low Carb  For Heart failure patients - Check your Weight same time everyday, if you gain over 2 pounds, or you develop in leg swelling, experience more shortness of breath or chest pain, call your Primary MD immediately. Follow Cardiac Low Salt Diet and 1.5 lit/day fluid restriction.  On your next visit with your primary care physician please Get Medicines reviewed and adjusted.  Please request your Prim.MD to go over all Hospital Tests and Procedure/Radiological results at the follow up,  please get all Hospital records sent to your Prim MD by signing hospital release before you go home.  If you experience worsening of your admission symptoms, develop shortness of breath, life threatening emergency, suicidal or homicidal thoughts you must seek medical attention immediately by calling 911 or calling your MD immediately  if symptoms less severe.  You Must read complete instructions/literature along with all the possible adverse reactions/side effects for all the Medicines you take and that have been prescribed to you. Take any new Medicines after you have completely understood and accpet all the possible adverse reactions/side effects.   Do not drive, operate heavy machinery, perform activities at heights, swimming or participation in water activities or provide baby sitting services if your were admitted for syncope or siezures until you have seen by Primary MD or a Neurologist and advised to do so again.  Do not drive when taking Pain medications.    Do not take more than prescribed Pain, Sleep and Anxiety Medications  Special Instructions: If you have smoked or chewed Tobacco  in the last 2 yrs please stop smoking, stop any regular Alcohol  and or any Recreational drug use.  Wear Seat belts while driving.   Please note  You were cared for by a hospitalist during your hospital stay. If you have any questions about your discharge medications or the care you received while you were in the hospital after you are discharged, you can call the unit and asked to speak with the hospitalist on call if the hospitalist that took care of you is not available. Once you are discharged, your primary care physician will handle any further medical issues. Please note that NO REFILLS for any discharge medications will be authorized once you are discharged, as it is imperative that you return to your primary care physician (or establish a relationship with a primary care physician if you do not have  one) for your aftercare needs so that they can reassess your  need for medications and monitor your lab values.

## 2017-01-25 NOTE — Progress Notes (Signed)
Patients HR dropped into the thirties currently sustaining in the 40's with a BP 88/44.  Pt. Asymptomatic eating breakfast at this time.  Updated Dr. Candiss Norse and received verbal orders.  Will continue to follow up.

## 2017-01-27 DIAGNOSIS — M1990 Unspecified osteoarthritis, unspecified site: Secondary | ICD-10-CM | POA: Diagnosis not present

## 2017-01-27 DIAGNOSIS — S066X1D Traumatic subarachnoid hemorrhage with loss of consciousness of 30 minutes or less, subsequent encounter: Secondary | ICD-10-CM | POA: Diagnosis not present

## 2017-01-27 DIAGNOSIS — G4730 Sleep apnea, unspecified: Secondary | ICD-10-CM | POA: Diagnosis not present

## 2017-01-27 DIAGNOSIS — I130 Hypertensive heart and chronic kidney disease with heart failure and stage 1 through stage 4 chronic kidney disease, or unspecified chronic kidney disease: Secondary | ICD-10-CM | POA: Diagnosis not present

## 2017-01-27 DIAGNOSIS — I5022 Chronic systolic (congestive) heart failure: Secondary | ICD-10-CM | POA: Diagnosis not present

## 2017-01-27 DIAGNOSIS — N183 Chronic kidney disease, stage 3 (moderate): Secondary | ICD-10-CM | POA: Diagnosis not present

## 2017-01-27 DIAGNOSIS — E1122 Type 2 diabetes mellitus with diabetic chronic kidney disease: Secondary | ICD-10-CM | POA: Diagnosis not present

## 2017-01-27 DIAGNOSIS — F0390 Unspecified dementia without behavioral disturbance: Secondary | ICD-10-CM | POA: Diagnosis not present

## 2017-01-27 DIAGNOSIS — I2510 Atherosclerotic heart disease of native coronary artery without angina pectoris: Secondary | ICD-10-CM | POA: Diagnosis not present

## 2017-01-30 ENCOUNTER — Ambulatory Visit: Payer: Medicare HMO | Admitting: Cardiovascular Disease

## 2017-01-30 ENCOUNTER — Telehealth: Payer: Self-pay | Admitting: Internal Medicine

## 2017-01-30 DIAGNOSIS — I130 Hypertensive heart and chronic kidney disease with heart failure and stage 1 through stage 4 chronic kidney disease, or unspecified chronic kidney disease: Secondary | ICD-10-CM | POA: Diagnosis not present

## 2017-01-30 DIAGNOSIS — I2510 Atherosclerotic heart disease of native coronary artery without angina pectoris: Secondary | ICD-10-CM | POA: Diagnosis not present

## 2017-01-30 DIAGNOSIS — I5022 Chronic systolic (congestive) heart failure: Secondary | ICD-10-CM | POA: Diagnosis not present

## 2017-01-30 DIAGNOSIS — E1122 Type 2 diabetes mellitus with diabetic chronic kidney disease: Secondary | ICD-10-CM | POA: Diagnosis not present

## 2017-01-30 DIAGNOSIS — F0390 Unspecified dementia without behavioral disturbance: Secondary | ICD-10-CM | POA: Diagnosis not present

## 2017-01-30 DIAGNOSIS — G4730 Sleep apnea, unspecified: Secondary | ICD-10-CM | POA: Diagnosis not present

## 2017-01-30 DIAGNOSIS — S066X1D Traumatic subarachnoid hemorrhage with loss of consciousness of 30 minutes or less, subsequent encounter: Secondary | ICD-10-CM | POA: Diagnosis not present

## 2017-01-30 DIAGNOSIS — M1990 Unspecified osteoarthritis, unspecified site: Secondary | ICD-10-CM | POA: Diagnosis not present

## 2017-01-30 DIAGNOSIS — N183 Chronic kidney disease, stage 3 (moderate): Secondary | ICD-10-CM | POA: Diagnosis not present

## 2017-01-30 NOTE — Telephone Encounter (Signed)
[facility]  [phone]   She needs verbal for a Education officer, museum consult

## 2017-01-31 DIAGNOSIS — M1990 Unspecified osteoarthritis, unspecified site: Secondary | ICD-10-CM | POA: Diagnosis not present

## 2017-01-31 DIAGNOSIS — E1122 Type 2 diabetes mellitus with diabetic chronic kidney disease: Secondary | ICD-10-CM | POA: Diagnosis not present

## 2017-01-31 DIAGNOSIS — F0390 Unspecified dementia without behavioral disturbance: Secondary | ICD-10-CM | POA: Diagnosis not present

## 2017-01-31 DIAGNOSIS — I2510 Atherosclerotic heart disease of native coronary artery without angina pectoris: Secondary | ICD-10-CM | POA: Diagnosis not present

## 2017-01-31 DIAGNOSIS — S066X1D Traumatic subarachnoid hemorrhage with loss of consciousness of 30 minutes or less, subsequent encounter: Secondary | ICD-10-CM | POA: Diagnosis not present

## 2017-01-31 DIAGNOSIS — I5022 Chronic systolic (congestive) heart failure: Secondary | ICD-10-CM | POA: Diagnosis not present

## 2017-01-31 DIAGNOSIS — I130 Hypertensive heart and chronic kidney disease with heart failure and stage 1 through stage 4 chronic kidney disease, or unspecified chronic kidney disease: Secondary | ICD-10-CM | POA: Diagnosis not present

## 2017-01-31 DIAGNOSIS — G4730 Sleep apnea, unspecified: Secondary | ICD-10-CM | POA: Diagnosis not present

## 2017-01-31 DIAGNOSIS — N183 Chronic kidney disease, stage 3 (moderate): Secondary | ICD-10-CM | POA: Diagnosis not present

## 2017-01-31 NOTE — Telephone Encounter (Signed)
verbals given

## 2017-02-03 DIAGNOSIS — G4730 Sleep apnea, unspecified: Secondary | ICD-10-CM | POA: Diagnosis not present

## 2017-02-03 DIAGNOSIS — N183 Chronic kidney disease, stage 3 (moderate): Secondary | ICD-10-CM | POA: Diagnosis not present

## 2017-02-03 DIAGNOSIS — I130 Hypertensive heart and chronic kidney disease with heart failure and stage 1 through stage 4 chronic kidney disease, or unspecified chronic kidney disease: Secondary | ICD-10-CM | POA: Diagnosis not present

## 2017-02-03 DIAGNOSIS — I5022 Chronic systolic (congestive) heart failure: Secondary | ICD-10-CM | POA: Diagnosis not present

## 2017-02-03 DIAGNOSIS — E1122 Type 2 diabetes mellitus with diabetic chronic kidney disease: Secondary | ICD-10-CM | POA: Diagnosis not present

## 2017-02-03 DIAGNOSIS — F0390 Unspecified dementia without behavioral disturbance: Secondary | ICD-10-CM | POA: Diagnosis not present

## 2017-02-03 DIAGNOSIS — I2510 Atherosclerotic heart disease of native coronary artery without angina pectoris: Secondary | ICD-10-CM | POA: Diagnosis not present

## 2017-02-03 DIAGNOSIS — S066X1D Traumatic subarachnoid hemorrhage with loss of consciousness of 30 minutes or less, subsequent encounter: Secondary | ICD-10-CM | POA: Diagnosis not present

## 2017-02-03 DIAGNOSIS — M1990 Unspecified osteoarthritis, unspecified site: Secondary | ICD-10-CM | POA: Diagnosis not present

## 2017-02-04 DIAGNOSIS — W19XXXD Unspecified fall, subsequent encounter: Secondary | ICD-10-CM

## 2017-02-04 DIAGNOSIS — Z9181 History of falling: Secondary | ICD-10-CM

## 2017-02-04 DIAGNOSIS — I5022 Chronic systolic (congestive) heart failure: Secondary | ICD-10-CM | POA: Diagnosis not present

## 2017-02-04 DIAGNOSIS — M1990 Unspecified osteoarthritis, unspecified site: Secondary | ICD-10-CM | POA: Diagnosis not present

## 2017-02-04 DIAGNOSIS — Z7982 Long term (current) use of aspirin: Secondary | ICD-10-CM

## 2017-02-04 DIAGNOSIS — S066X1D Traumatic subarachnoid hemorrhage with loss of consciousness of 30 minutes or less, subsequent encounter: Secondary | ICD-10-CM | POA: Diagnosis not present

## 2017-02-04 DIAGNOSIS — E1122 Type 2 diabetes mellitus with diabetic chronic kidney disease: Secondary | ICD-10-CM | POA: Diagnosis not present

## 2017-02-04 DIAGNOSIS — I2510 Atherosclerotic heart disease of native coronary artery without angina pectoris: Secondary | ICD-10-CM | POA: Diagnosis not present

## 2017-02-04 DIAGNOSIS — G4730 Sleep apnea, unspecified: Secondary | ICD-10-CM | POA: Diagnosis not present

## 2017-02-04 DIAGNOSIS — F0390 Unspecified dementia without behavioral disturbance: Secondary | ICD-10-CM | POA: Diagnosis not present

## 2017-02-04 DIAGNOSIS — I130 Hypertensive heart and chronic kidney disease with heart failure and stage 1 through stage 4 chronic kidney disease, or unspecified chronic kidney disease: Secondary | ICD-10-CM | POA: Diagnosis not present

## 2017-02-04 DIAGNOSIS — N183 Chronic kidney disease, stage 3 (moderate): Secondary | ICD-10-CM | POA: Diagnosis not present

## 2017-02-05 DIAGNOSIS — G4730 Sleep apnea, unspecified: Secondary | ICD-10-CM | POA: Diagnosis not present

## 2017-02-05 DIAGNOSIS — M1990 Unspecified osteoarthritis, unspecified site: Secondary | ICD-10-CM | POA: Diagnosis not present

## 2017-02-05 DIAGNOSIS — E1122 Type 2 diabetes mellitus with diabetic chronic kidney disease: Secondary | ICD-10-CM | POA: Diagnosis not present

## 2017-02-05 DIAGNOSIS — S066X1D Traumatic subarachnoid hemorrhage with loss of consciousness of 30 minutes or less, subsequent encounter: Secondary | ICD-10-CM | POA: Diagnosis not present

## 2017-02-05 DIAGNOSIS — I5022 Chronic systolic (congestive) heart failure: Secondary | ICD-10-CM | POA: Diagnosis not present

## 2017-02-05 DIAGNOSIS — I130 Hypertensive heart and chronic kidney disease with heart failure and stage 1 through stage 4 chronic kidney disease, or unspecified chronic kidney disease: Secondary | ICD-10-CM | POA: Diagnosis not present

## 2017-02-05 DIAGNOSIS — N183 Chronic kidney disease, stage 3 (moderate): Secondary | ICD-10-CM | POA: Diagnosis not present

## 2017-02-05 DIAGNOSIS — F0390 Unspecified dementia without behavioral disturbance: Secondary | ICD-10-CM | POA: Diagnosis not present

## 2017-02-05 DIAGNOSIS — I2510 Atherosclerotic heart disease of native coronary artery without angina pectoris: Secondary | ICD-10-CM | POA: Diagnosis not present

## 2017-02-06 ENCOUNTER — Inpatient Hospital Stay: Payer: Medicare HMO | Admitting: Internal Medicine

## 2017-02-07 ENCOUNTER — Telehealth: Payer: Self-pay | Admitting: Internal Medicine

## 2017-02-07 DIAGNOSIS — I5022 Chronic systolic (congestive) heart failure: Secondary | ICD-10-CM | POA: Diagnosis not present

## 2017-02-07 DIAGNOSIS — M1990 Unspecified osteoarthritis, unspecified site: Secondary | ICD-10-CM | POA: Diagnosis not present

## 2017-02-07 DIAGNOSIS — S066X1D Traumatic subarachnoid hemorrhage with loss of consciousness of 30 minutes or less, subsequent encounter: Secondary | ICD-10-CM | POA: Diagnosis not present

## 2017-02-07 DIAGNOSIS — E1122 Type 2 diabetes mellitus with diabetic chronic kidney disease: Secondary | ICD-10-CM | POA: Diagnosis not present

## 2017-02-07 DIAGNOSIS — G4730 Sleep apnea, unspecified: Secondary | ICD-10-CM | POA: Diagnosis not present

## 2017-02-07 DIAGNOSIS — N183 Chronic kidney disease, stage 3 (moderate): Secondary | ICD-10-CM | POA: Diagnosis not present

## 2017-02-07 DIAGNOSIS — I2510 Atherosclerotic heart disease of native coronary artery without angina pectoris: Secondary | ICD-10-CM | POA: Diagnosis not present

## 2017-02-07 DIAGNOSIS — I130 Hypertensive heart and chronic kidney disease with heart failure and stage 1 through stage 4 chronic kidney disease, or unspecified chronic kidney disease: Secondary | ICD-10-CM | POA: Diagnosis not present

## 2017-02-07 DIAGNOSIS — F0390 Unspecified dementia without behavioral disturbance: Secondary | ICD-10-CM | POA: Diagnosis not present

## 2017-02-07 NOTE — Telephone Encounter (Signed)
Please advise 

## 2017-02-07 NOTE — Telephone Encounter (Signed)
Pt was recently in the hospital for a fall. The doctor there prescribed niMODipine (NIMOTOP) 30 MG. No pharmacy in [place] is able to get this and some of them do not even know what it is. Raquel Sarna from [place] said that she thinks it was prescribed to help with hemorging due to the pt having a fall and hitting her head. She was suppose to come in for a hospital follow up on 02/06/2017. The husband called to cancel it because the pt was not able to come. Raquel Sarna mentioned that the husband has been showing signs of dementia himself and that he may have not been able to get her here or encourage her to come. He is her primary caregiver. There is a hospital fu scheduled on 02/25/17. Is there another medication that Dr Alain Marion would recommend for her to take instead? Please advise.

## 2017-02-08 DIAGNOSIS — M1990 Unspecified osteoarthritis, unspecified site: Secondary | ICD-10-CM | POA: Diagnosis not present

## 2017-02-08 DIAGNOSIS — F0390 Unspecified dementia without behavioral disturbance: Secondary | ICD-10-CM | POA: Diagnosis not present

## 2017-02-08 DIAGNOSIS — G4730 Sleep apnea, unspecified: Secondary | ICD-10-CM | POA: Diagnosis not present

## 2017-02-08 DIAGNOSIS — I130 Hypertensive heart and chronic kidney disease with heart failure and stage 1 through stage 4 chronic kidney disease, or unspecified chronic kidney disease: Secondary | ICD-10-CM | POA: Diagnosis not present

## 2017-02-08 DIAGNOSIS — I2510 Atherosclerotic heart disease of native coronary artery without angina pectoris: Secondary | ICD-10-CM | POA: Diagnosis not present

## 2017-02-08 DIAGNOSIS — I5022 Chronic systolic (congestive) heart failure: Secondary | ICD-10-CM | POA: Diagnosis not present

## 2017-02-08 DIAGNOSIS — N183 Chronic kidney disease, stage 3 (moderate): Secondary | ICD-10-CM | POA: Diagnosis not present

## 2017-02-08 DIAGNOSIS — S066X1D Traumatic subarachnoid hemorrhage with loss of consciousness of 30 minutes or less, subsequent encounter: Secondary | ICD-10-CM | POA: Diagnosis not present

## 2017-02-08 DIAGNOSIS — E1122 Type 2 diabetes mellitus with diabetic chronic kidney disease: Secondary | ICD-10-CM | POA: Diagnosis not present

## 2017-02-08 NOTE — Telephone Encounter (Signed)
OK not to take it Keep/sch ROV Thx

## 2017-02-10 DIAGNOSIS — I2510 Atherosclerotic heart disease of native coronary artery without angina pectoris: Secondary | ICD-10-CM | POA: Diagnosis not present

## 2017-02-10 DIAGNOSIS — E1122 Type 2 diabetes mellitus with diabetic chronic kidney disease: Secondary | ICD-10-CM | POA: Diagnosis not present

## 2017-02-10 DIAGNOSIS — S066X1D Traumatic subarachnoid hemorrhage with loss of consciousness of 30 minutes or less, subsequent encounter: Secondary | ICD-10-CM | POA: Diagnosis not present

## 2017-02-10 DIAGNOSIS — G4730 Sleep apnea, unspecified: Secondary | ICD-10-CM | POA: Diagnosis not present

## 2017-02-10 DIAGNOSIS — M1990 Unspecified osteoarthritis, unspecified site: Secondary | ICD-10-CM | POA: Diagnosis not present

## 2017-02-10 DIAGNOSIS — I130 Hypertensive heart and chronic kidney disease with heart failure and stage 1 through stage 4 chronic kidney disease, or unspecified chronic kidney disease: Secondary | ICD-10-CM | POA: Diagnosis not present

## 2017-02-10 DIAGNOSIS — N183 Chronic kidney disease, stage 3 (moderate): Secondary | ICD-10-CM | POA: Diagnosis not present

## 2017-02-10 DIAGNOSIS — I5022 Chronic systolic (congestive) heart failure: Secondary | ICD-10-CM | POA: Diagnosis not present

## 2017-02-10 DIAGNOSIS — F0390 Unspecified dementia without behavioral disturbance: Secondary | ICD-10-CM | POA: Diagnosis not present

## 2017-02-11 ENCOUNTER — Telehealth: Payer: Self-pay | Admitting: Cardiovascular Disease

## 2017-02-11 NOTE — Telephone Encounter (Signed)
Closed Encounter  °

## 2017-02-11 NOTE — Telephone Encounter (Signed)
Spoke with Raquel Sarna and notified her of what Dr. Alain Marion stated

## 2017-02-13 DIAGNOSIS — N183 Chronic kidney disease, stage 3 (moderate): Secondary | ICD-10-CM | POA: Diagnosis not present

## 2017-02-13 DIAGNOSIS — I5022 Chronic systolic (congestive) heart failure: Secondary | ICD-10-CM | POA: Diagnosis not present

## 2017-02-13 DIAGNOSIS — E1122 Type 2 diabetes mellitus with diabetic chronic kidney disease: Secondary | ICD-10-CM | POA: Diagnosis not present

## 2017-02-13 DIAGNOSIS — I130 Hypertensive heart and chronic kidney disease with heart failure and stage 1 through stage 4 chronic kidney disease, or unspecified chronic kidney disease: Secondary | ICD-10-CM | POA: Diagnosis not present

## 2017-02-13 DIAGNOSIS — S066X1D Traumatic subarachnoid hemorrhage with loss of consciousness of 30 minutes or less, subsequent encounter: Secondary | ICD-10-CM | POA: Diagnosis not present

## 2017-02-13 DIAGNOSIS — I2510 Atherosclerotic heart disease of native coronary artery without angina pectoris: Secondary | ICD-10-CM | POA: Diagnosis not present

## 2017-02-13 DIAGNOSIS — G4730 Sleep apnea, unspecified: Secondary | ICD-10-CM | POA: Diagnosis not present

## 2017-02-13 DIAGNOSIS — F0390 Unspecified dementia without behavioral disturbance: Secondary | ICD-10-CM | POA: Diagnosis not present

## 2017-02-13 DIAGNOSIS — M1990 Unspecified osteoarthritis, unspecified site: Secondary | ICD-10-CM | POA: Diagnosis not present

## 2017-02-19 DIAGNOSIS — S066X1D Traumatic subarachnoid hemorrhage with loss of consciousness of 30 minutes or less, subsequent encounter: Secondary | ICD-10-CM | POA: Diagnosis not present

## 2017-02-19 DIAGNOSIS — G4730 Sleep apnea, unspecified: Secondary | ICD-10-CM | POA: Diagnosis not present

## 2017-02-19 DIAGNOSIS — I130 Hypertensive heart and chronic kidney disease with heart failure and stage 1 through stage 4 chronic kidney disease, or unspecified chronic kidney disease: Secondary | ICD-10-CM | POA: Diagnosis not present

## 2017-02-19 DIAGNOSIS — F0390 Unspecified dementia without behavioral disturbance: Secondary | ICD-10-CM | POA: Diagnosis not present

## 2017-02-19 DIAGNOSIS — N183 Chronic kidney disease, stage 3 (moderate): Secondary | ICD-10-CM | POA: Diagnosis not present

## 2017-02-19 DIAGNOSIS — E1122 Type 2 diabetes mellitus with diabetic chronic kidney disease: Secondary | ICD-10-CM | POA: Diagnosis not present

## 2017-02-19 DIAGNOSIS — I2510 Atherosclerotic heart disease of native coronary artery without angina pectoris: Secondary | ICD-10-CM | POA: Diagnosis not present

## 2017-02-19 DIAGNOSIS — I5022 Chronic systolic (congestive) heart failure: Secondary | ICD-10-CM | POA: Diagnosis not present

## 2017-02-19 DIAGNOSIS — M1990 Unspecified osteoarthritis, unspecified site: Secondary | ICD-10-CM | POA: Diagnosis not present

## 2017-02-21 ENCOUNTER — Ambulatory Visit: Payer: Medicare HMO | Admitting: Physician Assistant

## 2017-02-21 DIAGNOSIS — F0390 Unspecified dementia without behavioral disturbance: Secondary | ICD-10-CM | POA: Diagnosis not present

## 2017-02-21 DIAGNOSIS — I2510 Atherosclerotic heart disease of native coronary artery without angina pectoris: Secondary | ICD-10-CM | POA: Diagnosis not present

## 2017-02-21 DIAGNOSIS — M1990 Unspecified osteoarthritis, unspecified site: Secondary | ICD-10-CM | POA: Diagnosis not present

## 2017-02-21 DIAGNOSIS — S066X1D Traumatic subarachnoid hemorrhage with loss of consciousness of 30 minutes or less, subsequent encounter: Secondary | ICD-10-CM | POA: Diagnosis not present

## 2017-02-21 DIAGNOSIS — I5022 Chronic systolic (congestive) heart failure: Secondary | ICD-10-CM | POA: Diagnosis not present

## 2017-02-21 DIAGNOSIS — G4730 Sleep apnea, unspecified: Secondary | ICD-10-CM | POA: Diagnosis not present

## 2017-02-21 DIAGNOSIS — N183 Chronic kidney disease, stage 3 (moderate): Secondary | ICD-10-CM | POA: Diagnosis not present

## 2017-02-21 DIAGNOSIS — E1122 Type 2 diabetes mellitus with diabetic chronic kidney disease: Secondary | ICD-10-CM | POA: Diagnosis not present

## 2017-02-21 DIAGNOSIS — I130 Hypertensive heart and chronic kidney disease with heart failure and stage 1 through stage 4 chronic kidney disease, or unspecified chronic kidney disease: Secondary | ICD-10-CM | POA: Diagnosis not present

## 2017-02-25 ENCOUNTER — Inpatient Hospital Stay: Payer: Medicare HMO | Admitting: Internal Medicine

## 2017-02-25 DIAGNOSIS — E1122 Type 2 diabetes mellitus with diabetic chronic kidney disease: Secondary | ICD-10-CM | POA: Diagnosis not present

## 2017-02-25 DIAGNOSIS — G4730 Sleep apnea, unspecified: Secondary | ICD-10-CM | POA: Diagnosis not present

## 2017-02-25 DIAGNOSIS — I130 Hypertensive heart and chronic kidney disease with heart failure and stage 1 through stage 4 chronic kidney disease, or unspecified chronic kidney disease: Secondary | ICD-10-CM | POA: Diagnosis not present

## 2017-02-25 DIAGNOSIS — S066X1D Traumatic subarachnoid hemorrhage with loss of consciousness of 30 minutes or less, subsequent encounter: Secondary | ICD-10-CM | POA: Diagnosis not present

## 2017-02-25 DIAGNOSIS — N183 Chronic kidney disease, stage 3 (moderate): Secondary | ICD-10-CM | POA: Diagnosis not present

## 2017-02-25 DIAGNOSIS — I5022 Chronic systolic (congestive) heart failure: Secondary | ICD-10-CM | POA: Diagnosis not present

## 2017-02-25 DIAGNOSIS — M1990 Unspecified osteoarthritis, unspecified site: Secondary | ICD-10-CM | POA: Diagnosis not present

## 2017-02-25 DIAGNOSIS — I2510 Atherosclerotic heart disease of native coronary artery without angina pectoris: Secondary | ICD-10-CM | POA: Diagnosis not present

## 2017-02-25 DIAGNOSIS — F0390 Unspecified dementia without behavioral disturbance: Secondary | ICD-10-CM | POA: Diagnosis not present

## 2017-02-26 NOTE — Progress Notes (Deleted)
Cardiology Office Note    Date:  02/26/2017   ID:  Mary Cook, DOB 20-Jun-1926, MRN [id number]  PCP:  Mary Anger, MD  Cardiologist:  Dr. Claiborne Billings   No chief complaint on file.   History of Present Illness:  Mary Cook is a 83 y.o. female with PMH of Hypertension, history of GI bleed, obstructive sleep apnea on Cipro, history of CVA, DM 2 and history of CAD. He had a large out of the hospital anterior MI in September 8341 complicated by CHF and a post-MI pericarditis. She had significant thrombus burden required thrombectomy and the eventually underwent stenting of her left circumflex artery with 3.0 x 15 mm Promus DES postdilated to 3.5 mm. She had concomitant CAD involving her diagonal branch as well as her LAD and RCA. Repeat cardiac catheterization in October 2015 showed moderate LV dysfunction , EF 35%, moderate CAD with 50-80% first diagonal stenosis, 70% stenosis in small AV groove circumflex, 50-60% stenosis in proximal RCA with mild mid systolic myocardial bridging in the dominant RCA system. Medical therapy was recommended. Last Myoview on 10/29/2015 was consistent with prior MI was very mild peri-infarct ischemia involving the inferolateral wall. She had episode of acute kidney injury in July 2017 after increasing her Lasix to 40 mg twice a day, her Lasix was later reduced to 20 mg daily. She was most recently seen on 01/10/2017 at which time she was doing well. Her carvedilol was reduced to 6.25 mg twice a day to avoid significant bradycardia. Detox and was decreased to 0.0625 g daily. Amlodipine was increased to 7.5 mg daily to compensate.  She was recently admitted to hospital on 01/22/2017 after she fell and hit her head. She did not lose consciousness. However CT of the head in the ER showed small subarachnoid hemorrhage in the right quadrigeminal plate cistern and also in the occipital horn right lateral ventricle. Her neck CT was negative for acute fracture.  Neurosurgery was consulted who recommended conservative management. Her aspirin was held. According to discharge summary, serial after discharge, she was getting out of bed when her heart rate dropped down into the high 30s and low 40s. The case was discussed with Dr. Sallyanne Cook cardiologist on call, her digoxin was discontinued and carvedilol was further decreased by half. She was discharged with recommendation of a 30 day event monitor.   No EKG 30 day event monitor. Continue hold ASA  Past Medical History:  Diagnosis Date  . CAD (coronary artery disease)    a. 04/2010 MI DES x 2 to LCX;  b. 05/2014 Cath: EF 35%, patent LCX stents w/ 70% stenosis in small AV groove LCX, Diag 50-60, RCA 50-60p->Med Rx.  . Cerebral aneurysm   . Chronic combined systolic (congestive) and diastolic (congestive) heart failure ([employer])    a. 01/2015 Echo: EF 30-35%, sev inflat HK, Gr 1DD, mild AI.  Mary Cook Diverticulosis of colon   . Essential hypertension   . Frequent urination   . GIB (gastrointestinal bleeding)    a. 2002 Diverticular bleed.  . Hyperlipidemia   . Ischemic cardiomyopathy    a. 01/2015 Echo: EF 30-35%.  . Osteoarthritis   . Osteopenia   . Skin disorder   . Sleep apnea    cpap therapy  . Stroke ([place]) 2002  . T12 compression fracture (Mary Cook)    a. 04/2015.  Mary Cook Thrombus   . Type II diabetes mellitus (Mary Cook)   . Visual disorder     Past Surgical History:  Procedure Laterality Date  . ABDOMINAL HYSTERECTOMY  1980   no bso  . APPENDECTOMY    . CARDIAC CATHETERIZATION  07/2011   LAD OK, D1 80%, CFX 30% w/ patent stent, RCA 50-60%, EF 40%  . CARDIAC CATHETERIZATION    . CORONARY ANGIOPLASTY WITH STENT PLACEMENT  05/11/2010   stent CFX  . DOPPLER ECHOCARDIOGRAPHY    . LEFT HEART CATHETERIZATION WITH CORONARY ANGIOGRAM N/A 08/18/2011   Procedure: LEFT HEART CATHETERIZATION WITH CORONARY ANGIOGRAM;  Surgeon: Mary Hampshire, MD;  Location: [facility] CATH LAB;  Service: Cardiovascular;  Laterality: N/A;  .  LEFT HEART CATHETERIZATION WITH CORONARY ANGIOGRAM N/A 06/08/2014   Procedure: LEFT HEART CATHETERIZATION WITH CORONARY ANGIOGRAM;  Surgeon: Mary Sine, MD;  Location: [facility] CATH LAB;  Service: Cardiovascular;  Laterality: N/A;  . myocardial perfusion study    . THROMBECTOMY    . TONSILLECTOMY      Current Medications: Outpatient Medications Prior to Visit  Medication Sig Dispense Refill  . aspirin 81 MG EC tablet Take 81 mg by mouth daily.      Mary Cook b complex vitamins tablet Take 1 tablet by mouth daily. 100 tablet 3  . Blood Glucose Monitoring Suppl (ACCU-CHEK AVIVA PLUS) w/Device KIT Use to check blood sugars twice a day Dx. E11.9 1 kit 0  . carvedilol (COREG) 6.25 MG tablet Take 1 tablet (6.25 mg total) by mouth 2 (two) times daily with a meal. 60 tablet 0  . citalopram (CELEXA) 20 MG tablet TAKE 1 TABLET EVERY DAY 90 tablet 2  . donepezil (ARICEPT) 5 MG tablet Take 1 tablet (5 mg total) by mouth daily. 90 tablet 3  . furosemide (LASIX) 40 MG tablet Take 0.5 tablets (20 mg total) by mouth daily. 30 tablet 0  . isosorbide mononitrate (IMDUR) 60 MG 24 hr tablet Take 1 tablet (60 mg total) by mouth daily. 30 tablet 0  . metFORMIN (GLUCOPHAGE) 500 MG tablet Take 1 tablet (500 mg total) by mouth 2 (two) times daily with a meal.    . niMODipine (NIMOTOP) 30 MG capsule Take 2 capsules (60 mg total) by mouth every 4 (four) hours. 252 capsule 0  . omeprazole (PRILOSEC) 20 MG capsule Take 20 mg by mouth daily.    . potassium chloride SA (KLOR-CON M20) 20 MEQ tablet Take 1 tablet (20 mEq total) by mouth daily. 90 tablet 3  . sacubitril-valsartan (ENTRESTO) 49-51 MG Take 1 tablet by mouth 2 (two) times daily. 180 tablet 3   No facility-administered medications prior to visit.      Allergies:   Clopidogrel bisulfate; Lipitor [atorvastatin]; Namenda [memantine hcl]; and Lisinopril   Social History   Social History  . Marital status: Married    Spouse name: N/A  . Number of children: N/A  .  Years of education: N/A   Occupational History  . Retired    Social History Main Topics  . Smoking status: Never Smoker  . Smokeless tobacco: Never Used  . Alcohol use No  . Drug use: No  . Sexual activity: Not on file   Other Topics Concern  . Not on file   Social History Narrative  . No narrative on file     Family History:  The patient's ***family history includes Hypertension in her other.   ROS:   Please see the history of present illness.    ROS All other systems reviewed and are negative.   PHYSICAL EXAM:   VS:  There were no vitals taken for  this visit.   GEN: Well nourished, well developed, in no acute distress  HEENT: normal  Neck: no JVD, carotid bruits, or masses Cardiac: ***RRR; no murmurs, rubs, or gallops,no edema  Respiratory:  clear to auscultation bilaterally, normal work of breathing GI: soft, nontender, nondistended, + BS MS: no deformity or atrophy  Skin: warm and dry, no rash Neuro:  Alert and Oriented x 3, Strength and sensation are intact Psych: euthymic mood, full affect  Wt Readings from Last 3 Encounters:  01/22/17 153 lb (69.4 kg)  01/10/17 151 lb (68.5 kg)  12/12/16 154 lb 1.3 oz (69.9 kg)      Studies/Labs Reviewed:   EKG:  EKG is*** ordered today.  The ekg ordered today demonstrates ***  Recent Labs: 05/08/2016: Pro B Natriuretic peptide (BNP) 922.00 08/13/2016: Brain Natriuretic Peptide 174.8 01/22/2017: ALT 13; BUN 21; Creatinine, Ser 1.76; Hemoglobin 11.0; Platelets 179; Potassium 4.2; Sodium 138 01/25/2017: TSH 1.500   Lipid Panel    Component Value Date/Time   CHOL 230 (H) 11/16/2015 0240   TRIG 97 11/16/2015 0240   HDL 68 11/16/2015 0240   CHOLHDL 3.4 11/16/2015 0240   VLDL 19 11/16/2015 0240   LDLCALC 143 (H) 11/16/2015 0240   LDLDIRECT 180.7 12/09/2011 1109    Additional studies/ records that were reviewed today include:  ***    ASSESSMENT:    No diagnosis found.   PLAN:  In order of problems listed  above:  1. ***    Medication Adjustments/Labs and Tests Ordered: Current medicines are reviewed at length with the patient today.  Concerns regarding medicines are outlined above.  Medication changes, Labs and Tests ordered today are listed in the Patient Instructions below. There are no Patient Instructions on file for this visit.   Hilbert Corrigan, [place]  02/26/2017 10:38 PM    [facility] [place], [place], [place]  [address] Phone: [phone]; Fax: [phone]

## 2017-02-27 ENCOUNTER — Ambulatory Visit: Payer: Medicare HMO | Admitting: Physician Assistant

## 2017-02-28 ENCOUNTER — Ambulatory Visit: Payer: Self-pay | Admitting: Neurology

## 2017-02-28 ENCOUNTER — Telehealth: Payer: Self-pay

## 2017-02-28 NOTE — Telephone Encounter (Signed)
Pt no-showed her new pt appt this morning. 

## 2017-03-03 ENCOUNTER — Encounter: Payer: Self-pay | Admitting: Neurology

## 2017-03-11 DIAGNOSIS — E1122 Type 2 diabetes mellitus with diabetic chronic kidney disease: Secondary | ICD-10-CM | POA: Diagnosis not present

## 2017-03-11 DIAGNOSIS — M1990 Unspecified osteoarthritis, unspecified site: Secondary | ICD-10-CM | POA: Diagnosis not present

## 2017-03-11 DIAGNOSIS — F0390 Unspecified dementia without behavioral disturbance: Secondary | ICD-10-CM | POA: Diagnosis not present

## 2017-03-11 DIAGNOSIS — I5022 Chronic systolic (congestive) heart failure: Secondary | ICD-10-CM | POA: Diagnosis not present

## 2017-03-11 DIAGNOSIS — G4730 Sleep apnea, unspecified: Secondary | ICD-10-CM | POA: Diagnosis not present

## 2017-03-11 DIAGNOSIS — I130 Hypertensive heart and chronic kidney disease with heart failure and stage 1 through stage 4 chronic kidney disease, or unspecified chronic kidney disease: Secondary | ICD-10-CM | POA: Diagnosis not present

## 2017-03-11 DIAGNOSIS — S066X1D Traumatic subarachnoid hemorrhage with loss of consciousness of 30 minutes or less, subsequent encounter: Secondary | ICD-10-CM | POA: Diagnosis not present

## 2017-03-11 DIAGNOSIS — I2510 Atherosclerotic heart disease of native coronary artery without angina pectoris: Secondary | ICD-10-CM | POA: Diagnosis not present

## 2017-03-11 DIAGNOSIS — N183 Chronic kidney disease, stage 3 (moderate): Secondary | ICD-10-CM | POA: Diagnosis not present

## 2017-03-18 ENCOUNTER — Ambulatory Visit (INDEPENDENT_AMBULATORY_CARE_PROVIDER_SITE_OTHER): Payer: Medicare HMO | Admitting: Internal Medicine

## 2017-03-18 ENCOUNTER — Encounter: Payer: Self-pay | Admitting: Internal Medicine

## 2017-03-18 ENCOUNTER — Other Ambulatory Visit (INDEPENDENT_AMBULATORY_CARE_PROVIDER_SITE_OTHER): Payer: Medicare HMO

## 2017-03-18 DIAGNOSIS — I255 Ischemic cardiomyopathy: Secondary | ICD-10-CM | POA: Diagnosis not present

## 2017-03-18 DIAGNOSIS — F0281 Dementia in other diseases classified elsewhere with behavioral disturbance: Secondary | ICD-10-CM

## 2017-03-18 DIAGNOSIS — S06360S Traumatic hemorrhage of cerebrum, unspecified, without loss of consciousness, sequela: Secondary | ICD-10-CM | POA: Diagnosis not present

## 2017-03-18 DIAGNOSIS — S066X1S Traumatic subarachnoid hemorrhage with loss of consciousness of 30 minutes or less, sequela: Secondary | ICD-10-CM

## 2017-03-18 DIAGNOSIS — F02818 Dementia in other diseases classified elsewhere, unspecified severity, with other behavioral disturbance: Secondary | ICD-10-CM

## 2017-03-18 DIAGNOSIS — G301 Alzheimer's disease with late onset: Secondary | ICD-10-CM | POA: Diagnosis not present

## 2017-03-18 DIAGNOSIS — I5022 Chronic systolic (congestive) heart failure: Secondary | ICD-10-CM

## 2017-03-18 LAB — CBC WITH DIFFERENTIAL/PLATELET
BASOS PCT: 0.6 % (ref 0.0–3.0)
Basophils Absolute: 0 10*3/uL (ref 0.0–0.1)
EOS ABS: 0.2 10*3/uL (ref 0.0–0.7)
Eosinophils Relative: 3.1 % (ref 0.0–5.0)
HEMATOCRIT: 33.8 % — AB (ref 36.0–46.0)
HEMOGLOBIN: 11.3 g/dL — AB (ref 12.0–15.0)
LYMPHS PCT: 29.6 % (ref 12.0–46.0)
Lymphs Abs: 2 10*3/uL (ref 0.7–4.0)
MCHC: 33.3 g/dL (ref 30.0–36.0)
MCV: 95.8 fl (ref 78.0–100.0)
Monocytes Absolute: 0.6 10*3/uL (ref 0.1–1.0)
Monocytes Relative: 8.6 % (ref 3.0–12.0)
Neutro Abs: 4 10*3/uL (ref 1.4–7.7)
Neutrophils Relative %: 58.1 % (ref 43.0–77.0)
Platelets: 174 10*3/uL (ref 150.0–400.0)
RBC: 3.53 Mil/uL — ABNORMAL LOW (ref 3.87–5.11)
RDW: 13.9 % (ref 11.5–15.5)
WBC: 6.9 10*3/uL (ref 4.0–10.5)

## 2017-03-18 LAB — BASIC METABOLIC PANEL
BUN: 37 mg/dL — AB (ref 6–23)
CHLORIDE: 104 meq/L (ref 96–112)
CO2: 26 mEq/L (ref 19–32)
Calcium: 10.3 mg/dL (ref 8.4–10.5)
Creatinine, Ser: 2.14 mg/dL — ABNORMAL HIGH (ref 0.40–1.20)
GFR: 23.07 mL/min — ABNORMAL LOW (ref >60.00)
GLUCOSE: 152 mg/dL — AB (ref 70–99)
POTASSIUM: 4.3 meq/L (ref 3.5–5.1)
Sodium: 138 mEq/L (ref 135–145)

## 2017-03-18 MED ORDER — POTASSIUM CHLORIDE CRYS ER 20 MEQ PO TBCR: 20.0000 meq | EXTENDED_RELEASE_TABLET | Freq: Every day | ORAL | 3 refills | Status: DC

## 2017-03-18 NOTE — Assessment & Plan Note (Signed)
Worse after TSH

## 2017-03-18 NOTE — Assessment & Plan Note (Signed)
It was established - no need for surgery

## 2017-03-18 NOTE — Assessment & Plan Note (Signed)
Entresto

## 2017-03-18 NOTE — Progress Notes (Signed)
Subjective:  Patient ID: Mary Cook, female    DOB: Jun 24, 1926  Age: 83 y.o. MRN: [id number]  CC: No chief complaint on file.   HPI Mary Cook presents for a SAH and d/c'd on 01/25/17:  Per hx:  "Mechanical fall with traumatic subarachnoid hemorrhage. Neurosurgery, trauma and neurology all called and informed, neurology Formally saw the patient, she was also seen by PT OT, mental status stable no focal deficits, no headache. Per neuro we obtained an MRA brain, case discussed with neuro IR as well. MRA brain was done as well with no signs of aneurysm but possible post subarachnoid hemorrhage vasospasm, she was placed on limited pain for 21 days, now symptom-free will be discharged home with home PT and RN. Will follow with neurology and PCP outpatient within 1-2 weeks after discharge..  2. CK D3. Currently at baseline of creatinine around 1.8.  3. Chronic systolic CHF EF around 31-59%. Currently on combination of Coreg, Imdur, Entresto and Lasix which will be continued. Clinically compensated.  Addendum soon after discharge when patient was getting out of the bed her heart rate dropped into the high 30s low 40s, blood pressure dropped a little bit as well, discussed her case with Dr. Sallyanne Kuster cardiologist on call. At this time we will stop patient's digoxin, cut home dose Coreg in half, gentle IV fluid bolus, monitor heart rate and chonotropic response to activity. If all stable discharge with 30 day event monitor and outpatient follow-up with primary cardiologist Dr. Claiborne Billings within a week.  4. Hypertension. Stable on combination of Norvasc, Coreg, Imdur, Entresto and diuretic. For 21 days Norvasc has been held as she is on [place] to pain thereafter PCP to resume.  5. CAD. No acute issues aspirin on hold due to #1 above, continue beta blocker and Imdur.  6. GERD. Continue PPI.  7. DM type II. Resume home regimen."  F/u CAD, HTN, CHF, dementia f/u  Outpatient  Medications Prior to Visit  Medication Sig Dispense Refill  . aspirin 81 MG EC tablet Take 81 mg by mouth daily.      Marland Kitchen b complex vitamins tablet Take 1 tablet by mouth daily. 100 tablet 3  . Blood Glucose Monitoring Suppl (ACCU-CHEK AVIVA PLUS) w/Device KIT Use to check blood sugars twice a day Dx. E11.9 1 kit 0  . carvedilol (COREG) 6.25 MG tablet Take 1 tablet (6.25 mg total) by mouth 2 (two) times daily with a meal. 60 tablet 0  . citalopram (CELEXA) 20 MG tablet TAKE 1 TABLET EVERY DAY 90 tablet 2  . donepezil (ARICEPT) 5 MG tablet Take 1 tablet (5 mg total) by mouth daily. 90 tablet 3  . furosemide (LASIX) 40 MG tablet Take 0.5 tablets (20 mg total) by mouth daily. 30 tablet 0  . isosorbide mononitrate (IMDUR) 60 MG 24 hr tablet Take 1 tablet (60 mg total) by mouth daily. 30 tablet 0  . metFORMIN (GLUCOPHAGE) 500 MG tablet Take 1 tablet (500 mg total) by mouth 2 (two) times daily with a meal.    . omeprazole (PRILOSEC) 20 MG capsule Take 20 mg by mouth daily.    . potassium chloride SA (KLOR-CON M20) 20 MEQ tablet Take 1 tablet (20 mEq total) by mouth daily. 90 tablet 3  . sacubitril-valsartan (ENTRESTO) 49-51 MG Take 1 tablet by mouth 2 (two) times daily. 180 tablet 3  . niMODipine (NIMOTOP) 30 MG capsule Take 2 capsules (60 mg total) by mouth every 4 (four) hours. 252 capsule 0  No facility-administered medications prior to visit.     ROS Review of Systems  Constitutional: Positive for fatigue and unexpected weight change. Negative for activity change, appetite change and chills.  HENT: Negative for congestion, mouth sores and sinus pressure.   Eyes: Negative for visual disturbance.  Respiratory: Negative for cough and chest tightness.   Gastrointestinal: Negative for abdominal pain and nausea.  Genitourinary: Negative for difficulty urinating, frequency and vaginal pain.  Musculoskeletal: Positive for arthralgias and gait problem. Negative for back pain.  Skin: Negative for  pallor and rash.  Neurological: Negative for dizziness, tremors, weakness, numbness and headaches.  Psychiatric/Behavioral: Positive for behavioral problems, confusion and decreased concentration. Negative for sleep disturbance. The patient is nervous/anxious.     Objective:  BP 136/72 (BP Location: Left Arm, Patient Position: Sitting, Cuff Size: Normal)   Pulse (!) 52   Temp 97.8 F (36.6 C) (Oral)   Ht '5\' 5"'$  (1.651 m)   Wt 142 lb (64.4 kg)   SpO2 96%   BMI 23.63 kg/m   BP Readings from Last 3 Encounters:  03/18/17 136/72  01/25/17 (!) 124/48  01/10/17 (!) 152/60    Wt Readings from Last 3 Encounters:  03/18/17 142 lb (64.4 kg)  01/22/17 153 lb (69.4 kg)  01/10/17 151 lb (68.5 kg)    Physical Exam  Constitutional: She appears well-developed. No distress.  HENT:  Head: Normocephalic.  Right Ear: External ear normal.  Left Ear: External ear normal.  Nose: Nose normal.  Mouth/Throat: Oropharynx is clear and moist.  Eyes: Pupils are equal, round, and reactive to light. Conjunctivae are normal. Right eye exhibits no discharge. Left eye exhibits no discharge.  Neck: Normal range of motion. Neck supple. No JVD present. No tracheal deviation present. No thyromegaly present.  Cardiovascular: Normal rate, regular rhythm and normal heart sounds.   Pulmonary/Chest: No stridor. No respiratory distress. She has no wheezes.  Abdominal: Soft. Bowel sounds are normal. She exhibits no distension and no mass. There is no tenderness. There is no rebound and no guarding.  Musculoskeletal: She exhibits no edema or tenderness.  Lymphadenopathy:    She has no cervical adenopathy.  Neurological: She displays normal reflexes. No cranial nerve deficit. She exhibits normal muscle tone. Coordination abnormal.  Skin: No rash noted. No erythema.  Psychiatric: She has a normal mood and affect. Her behavior is normal. Judgment and thought content normal.   Cane Ataxic Confused  Lab Results    Component Value Date   WBC 6.1 01/22/2017   HGB 11.0 (L) 01/22/2017   HCT 33.7 (L) 01/22/2017   PLT 179 01/22/2017   GLUCOSE 149 (H) 01/22/2017   CHOL 230 (H) 11/16/2015   TRIG 97 11/16/2015   HDL 68 11/16/2015   LDLDIRECT 180.7 12/09/2011   LDLCALC 143 (H) 11/16/2015   ALT 13 (L) 01/22/2017   AST 17 01/22/2017   NA 138 01/22/2017   K 4.2 01/22/2017   CL 102 01/22/2017   CREATININE 1.76 (H) 01/22/2017   BUN 21 (H) 01/22/2017   CO2 29 01/22/2017   TSH 1.500 01/25/2017   INR 1.01 11/15/2015   HGBA1C 6.1 (H) 01/23/2017    Dg Chest 2 View  Result Date: 01/22/2017 CLINICAL DATA:  Initial evaluation for acute altered mental status, unresponsive. EXAM: CHEST  2 VIEW COMPARISON:  Prior CT from 06/19/2016. FINDINGS: Mild cardiomegaly, stable. Mediastinal silhouette within normal limits. Aortic atherosclerosis. Lungs mildly hypoinflated. No focal infiltrates. Mild perihilar vascular congestion like related hypoinflation. No pulmonary edema. No  pleural effusion. No pneumothorax. No acute osseus abnormality. IMPRESSION: 1. No active cardiopulmonary disease. 2. Stable cardiomegaly without pulmonary edema. 3. Aortic atherosclerosis. Electronically Signed   By: Jeannine Boga M.D.   On: 01/22/2017 14:23   Ct Head Wo Contrast  Result Date: 01/22/2017 CLINICAL DATA:  Fall on ground. Minimally responsive. Initial encounter. EXAM: CT HEAD WITHOUT CONTRAST CT CERVICAL SPINE WITHOUT CONTRAST TECHNIQUE: Multidetector CT imaging of the head and cervical spine was performed following the standard protocol without intravenous contrast. Multiplanar CT image reconstructions of the cervical spine were also generated. COMPARISON:  08/01/2014 FINDINGS: CT HEAD FINDINGS Brain: Small volume subarachnoid hemorrhage in the quadrigeminal plate cistern on the right. Trace hemorrhage seen in the occipital horn of the right lateral ventricle and in the septum pellucidum (the latter measuring 5 mm in maximal  dimension). Remote infarct in the right parietal and occipital lobe with ex vacuo right lateral ventricular enlargement. Remote lateral lenticulostriate distribution infarct on the right. Extensive chronic small vessel ischemic gliosis in the cerebral white matter. Probable remote lacunar infarct in the left thalamus. Small remote inferior left cerebellar infarct. Vascular: Atherosclerotic calcification. Skull: Negative for fracture Sinuses/Orbits: No acute finding.  Bilateral cataract resection. Other: Critical Value/emergent results were called by telephone at the time of interpretation on 01/22/2017 at 2:42 pm to Dr. Brantley Stage , who verbally acknowledged these results. CT CERVICAL SPINE FINDINGS Alignment: No traumatic malalignment. Facet mediated anterolisthesis at C3-4 and C4-5, mild. Skull base and vertebrae: No acute fracture. Soft tissues and spinal canal: No prevertebral fluid or swelling. No visible canal hematoma. Disc levels: Diffuse degenerative facet spurring. C5-6 and C6-7 predominant disc degeneration. No suspected degenerative cord impingement. Upper chest: No acute finding IMPRESSION: 1. Small volume subarachnoid hemorrhage in the right quadrigeminal plate cistern. 2. Small volume hemorrhage in the occipital horn right lateral ventricle and septum pellucidum. 3. Negative for cervical spine fracture. 4. Extensive remote ischemic injury. Electronically Signed   By: Monte Fantasia M.D.   On: 01/22/2017 14:45   Ct Cervical Spine Wo Contrast  Result Date: 01/22/2017 CLINICAL DATA:  Fall on ground. Minimally responsive. Initial encounter. EXAM: CT HEAD WITHOUT CONTRAST CT CERVICAL SPINE WITHOUT CONTRAST TECHNIQUE: Multidetector CT imaging of the head and cervical spine was performed following the standard protocol without intravenous contrast. Multiplanar CT image reconstructions of the cervical spine were also generated. COMPARISON:  08/01/2014 FINDINGS: CT HEAD FINDINGS Brain: Small volume  subarachnoid hemorrhage in the quadrigeminal plate cistern on the right. Trace hemorrhage seen in the occipital horn of the right lateral ventricle and in the septum pellucidum (the latter measuring 5 mm in maximal dimension). Remote infarct in the right parietal and occipital lobe with ex vacuo right lateral ventricular enlargement. Remote lateral lenticulostriate distribution infarct on the right. Extensive chronic small vessel ischemic gliosis in the cerebral white matter. Probable remote lacunar infarct in the left thalamus. Small remote inferior left cerebellar infarct. Vascular: Atherosclerotic calcification. Skull: Negative for fracture Sinuses/Orbits: No acute finding.  Bilateral cataract resection. Other: Critical Value/emergent results were called by telephone at the time of interpretation on 01/22/2017 at 2:42 pm to Dr. Brantley Stage , who verbally acknowledged these results. CT CERVICAL SPINE FINDINGS Alignment: No traumatic malalignment. Facet mediated anterolisthesis at C3-4 and C4-5, mild. Skull base and vertebrae: No acute fracture. Soft tissues and spinal canal: No prevertebral fluid or swelling. No visible canal hematoma. Disc levels: Diffuse degenerative facet spurring. C5-6 and C6-7 predominant disc degeneration. No suspected degenerative cord impingement. Upper chest:  No acute finding IMPRESSION: 1. Small volume subarachnoid hemorrhage in the right quadrigeminal plate cistern. 2. Small volume hemorrhage in the occipital horn right lateral ventricle and septum pellucidum. 3. Negative for cervical spine fracture. 4. Extensive remote ischemic injury. Electronically Signed   By: Monte Fantasia M.D.   On: 01/22/2017 14:45    Assessment & Plan:   There are no diagnoses linked to this encounter. I am having Ms. Aro maintain her aspirin, omeprazole, b complex vitamins, ACCU-CHEK AVIVA PLUS, furosemide, isosorbide mononitrate, citalopram, sacubitril-valsartan, donepezil, potassium chloride SA,  metFORMIN, niMODipine, and carvedilol.  No orders of the defined types were placed in this encounter.    Follow-up: No Follow-up on file.  Walker Kehr, MD

## 2017-03-18 NOTE — Assessment & Plan Note (Signed)
On Coreg, Digoxin, Isosorbide, Lasix, Entresto Mr Lightsey is c/o pt's noncompliance w/meds

## 2017-03-21 IMAGING — DX DG LUMBAR SPINE COMPLETE 4+V
5 series · 5 of 5 positions shown · non-contrast
Comparison: 02/11/2015

CLINICAL DATA: Status post fall 2 months ago.  Back pain.

EXAM:
LUMBAR SPINE - COMPLETE 4+ VIEW

[l-spine ap]
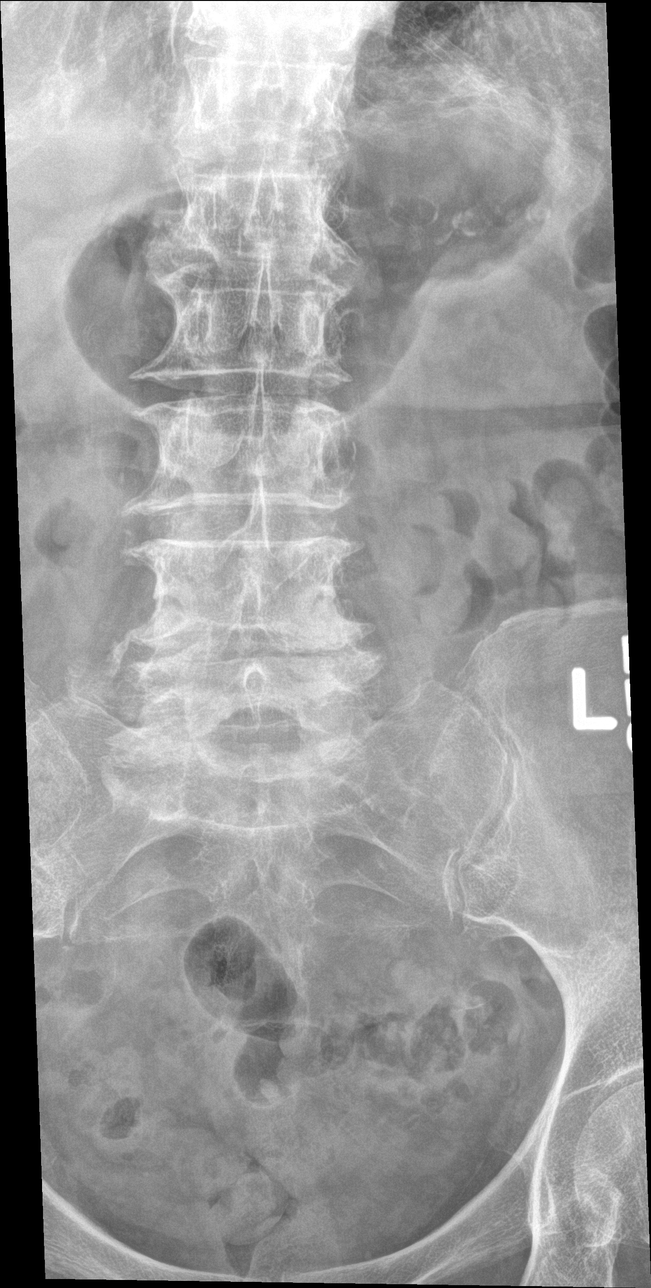

[l-spine obl (1 of 2)]
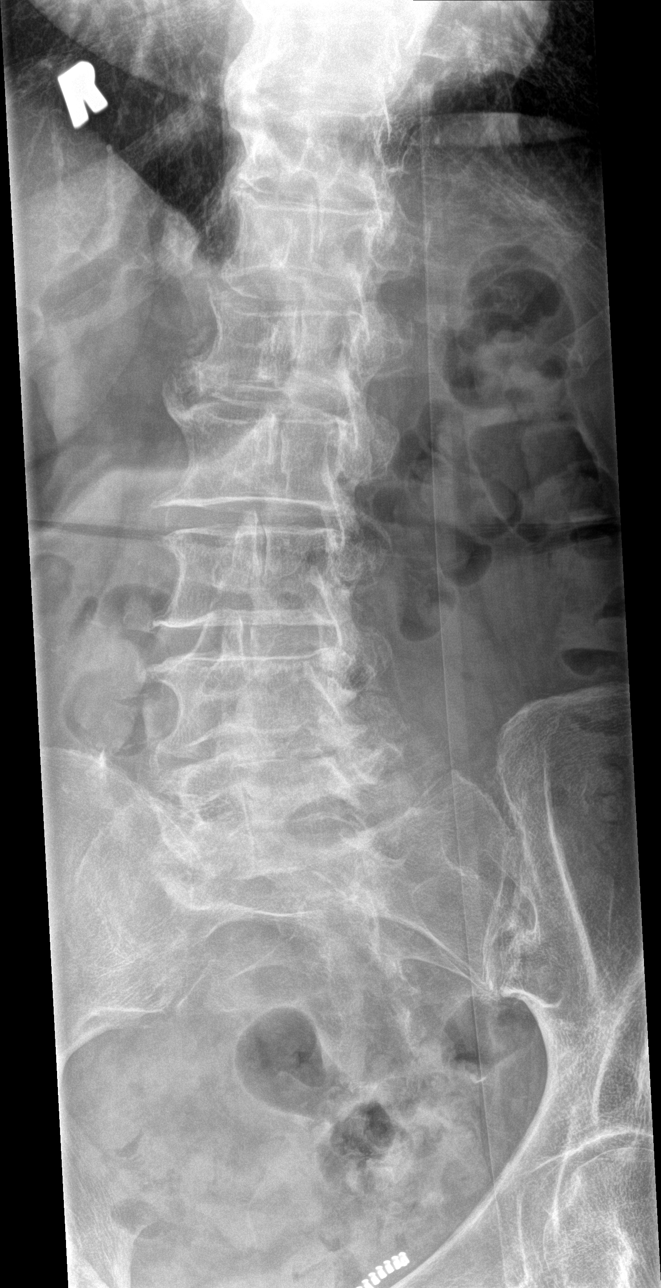

[l-spine obl (2 of 2)]
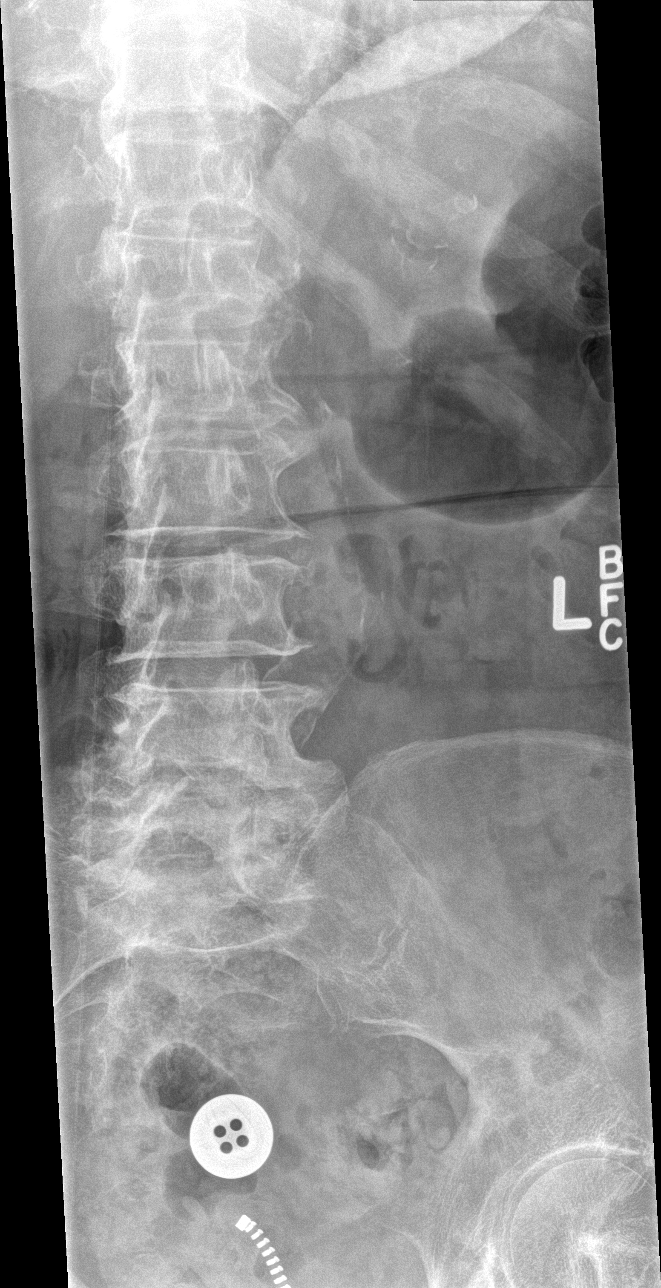

[l-spine lat]
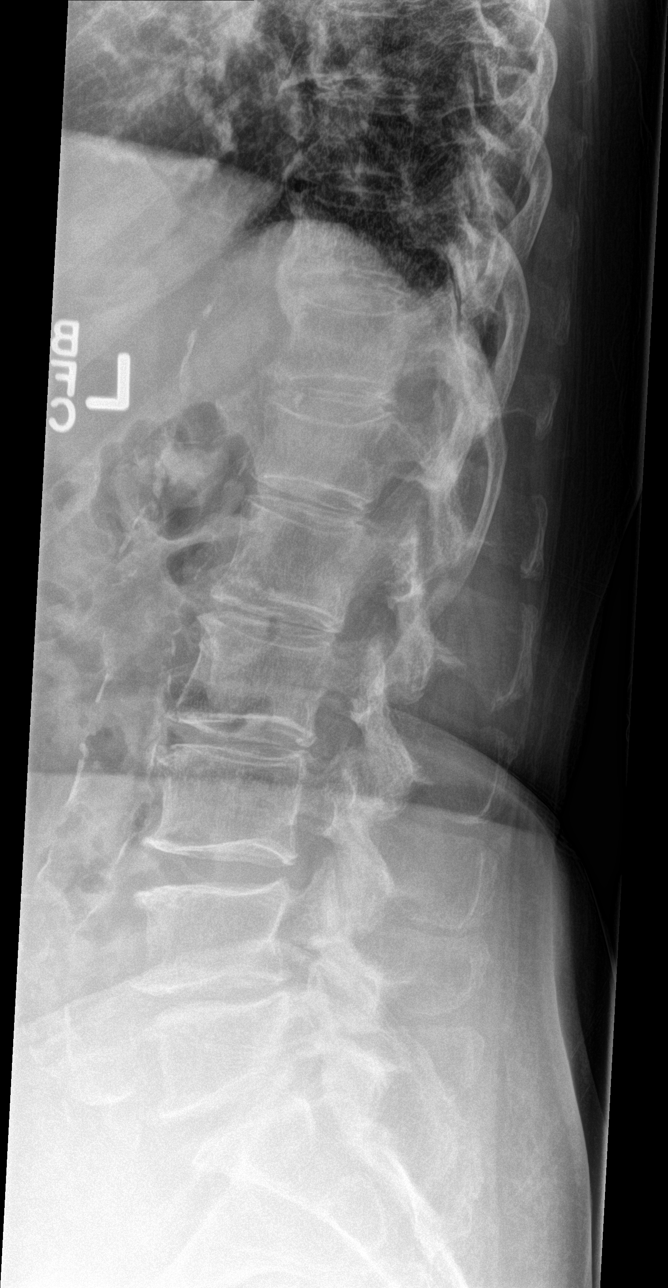

[l-spine spot]
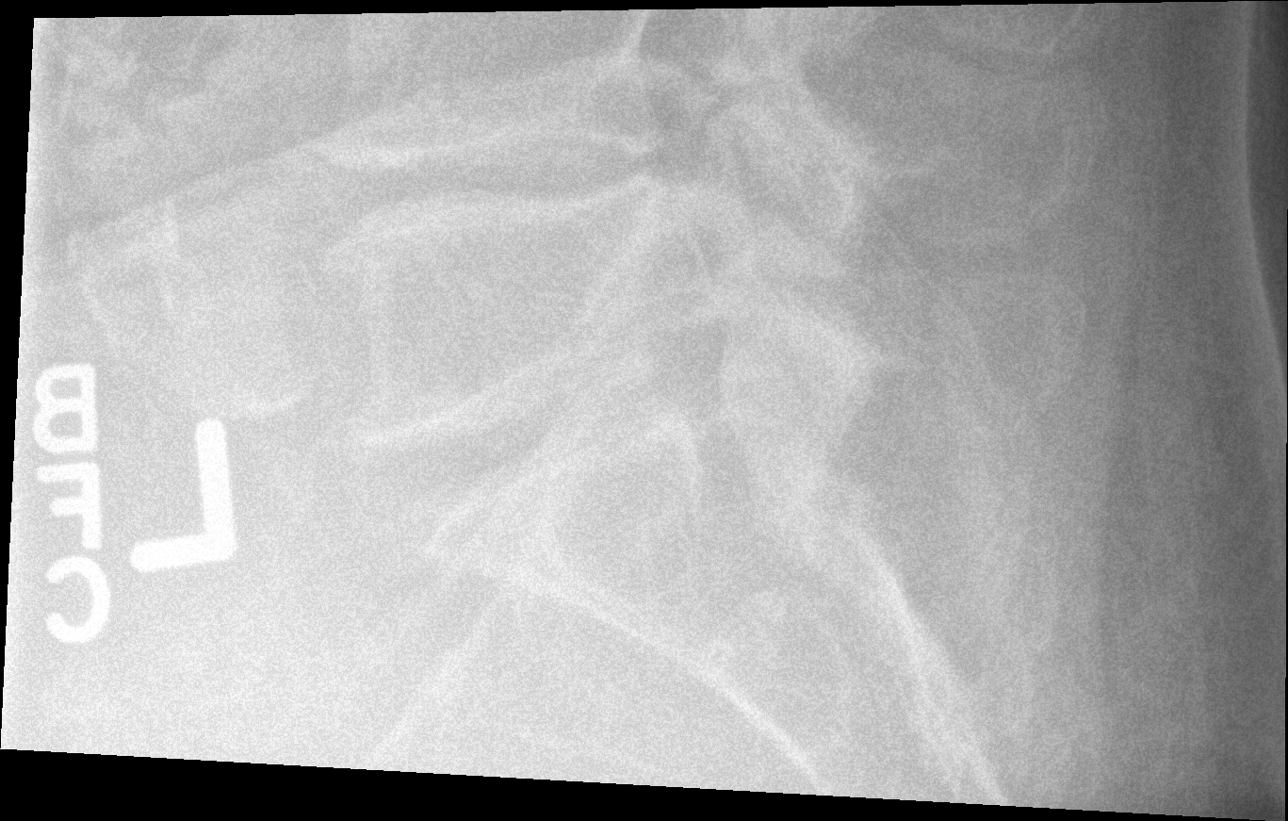

[5 of 5 positions shown; findings below may reference images not displayed]

FINDINGS: There are 5 nonrib bearing lumbar-type vertebral bodies.

Subacute T12 vertebral body compression fracture similar in
appearance to 02/11/2015.

The alignment is anatomic. There is no spondylolysis. There is no
static listhesis.

There is no acute fracture.

There is mild degenerative disc disease of the lumbar spine.

The SI joints are unremarkable.
IMPRESSION: 1. No acute osseous injury of the lumbar spine.
2. Subacute T12 vertebral body compression fracture similar in
appearance to 02/11/2015.

## 2017-03-21 IMAGING — DX DG CHEST 2V
2 series · 2 of 2 positions shown · non-contrast
Comparison: 04/18/2015.

CLINICAL DATA: Patient is was here 2 months ago. Dropped by RN. Has
had back pain. History CHF, heart stents. History of diabetes.

EXAM:
CHEST  2 VIEW

[chest lat]
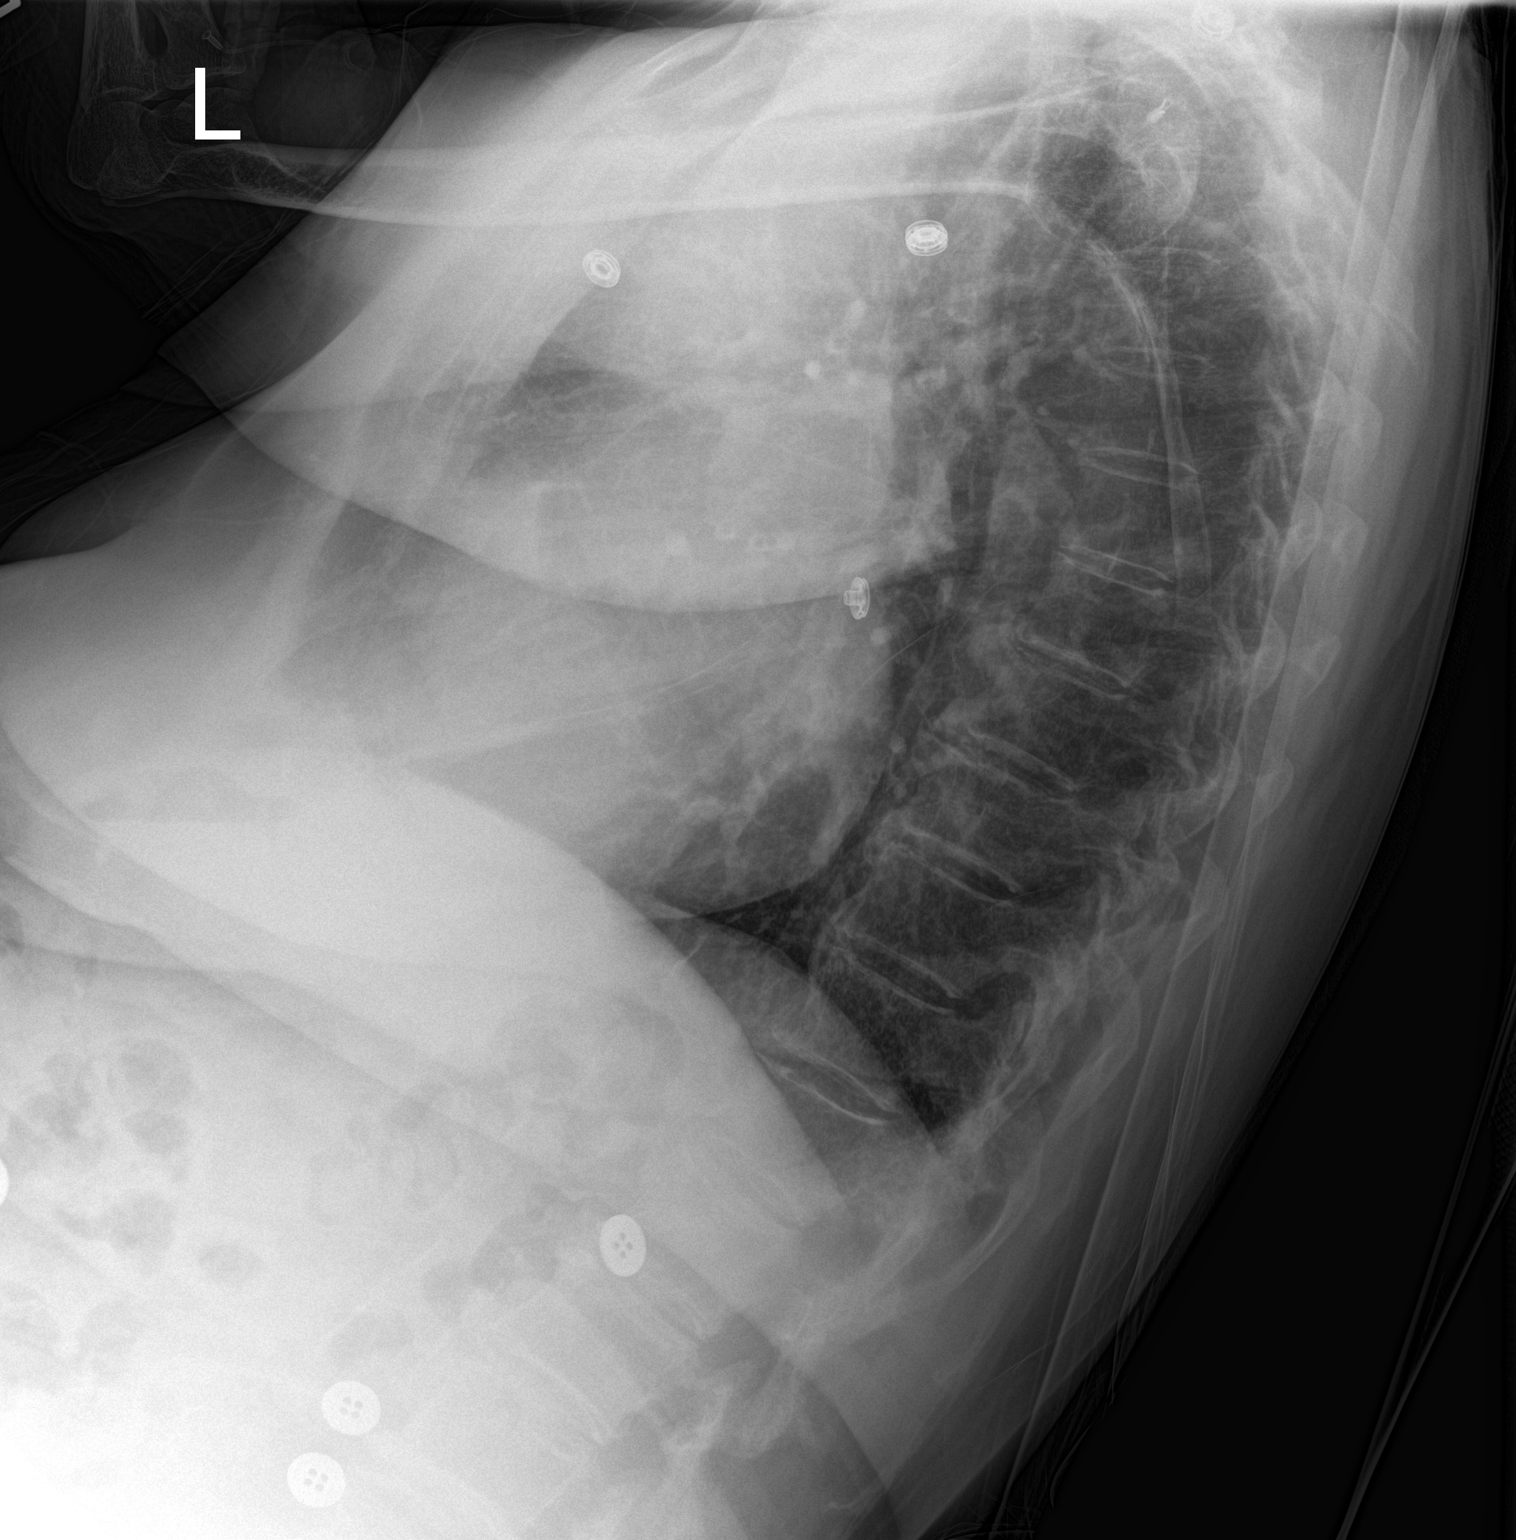

[chest ap]
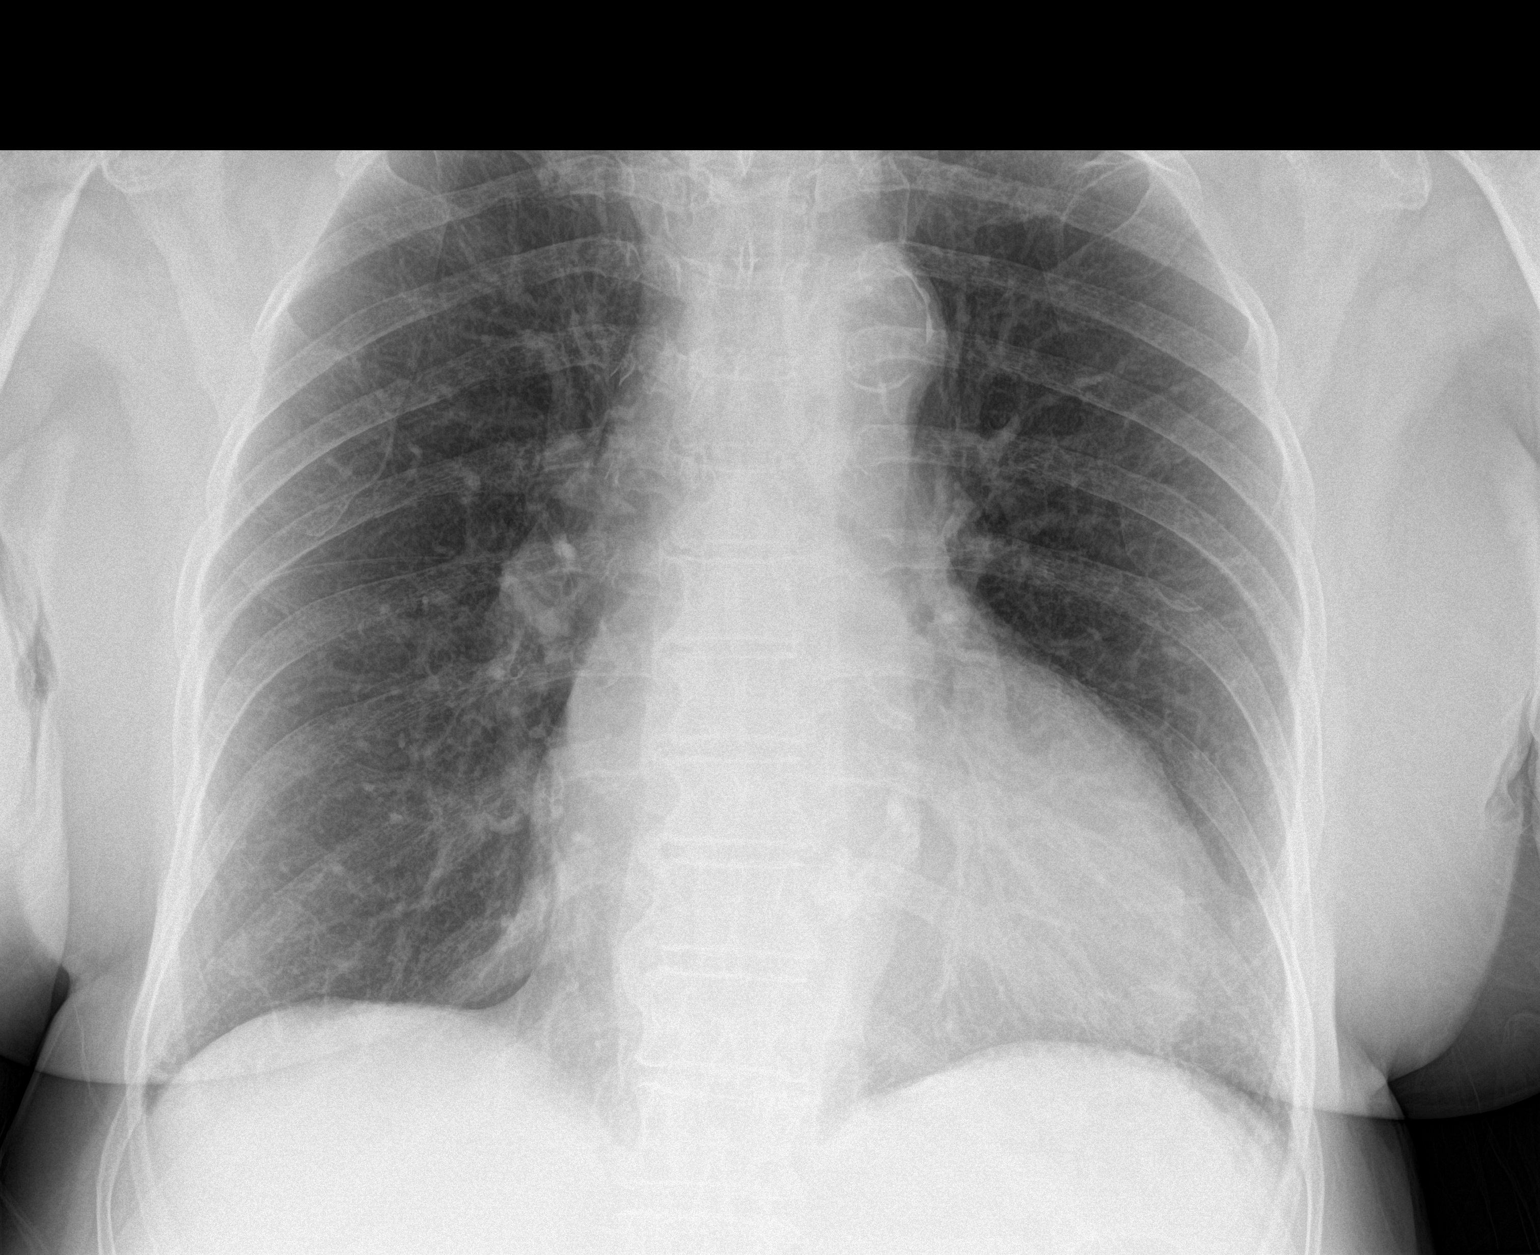

[2 of 2 positions shown; findings below may reference images not displayed]

FINDINGS: Heart is mildly enlarged. Aorta is tortuous and partially calcified.
There are no focal consolidations or pleural effusions. No pulmonary
edema. Moderate mid thoracic spondylosis.
IMPRESSION: Cardiomegaly.

## 2017-03-25 ENCOUNTER — Other Ambulatory Visit: Payer: Self-pay | Admitting: Internal Medicine

## 2017-03-26 DIAGNOSIS — F0390 Unspecified dementia without behavioral disturbance: Secondary | ICD-10-CM | POA: Diagnosis not present

## 2017-03-26 DIAGNOSIS — M1990 Unspecified osteoarthritis, unspecified site: Secondary | ICD-10-CM | POA: Diagnosis not present

## 2017-03-26 DIAGNOSIS — E1122 Type 2 diabetes mellitus with diabetic chronic kidney disease: Secondary | ICD-10-CM | POA: Diagnosis not present

## 2017-03-26 DIAGNOSIS — S066X1D Traumatic subarachnoid hemorrhage with loss of consciousness of 30 minutes or less, subsequent encounter: Secondary | ICD-10-CM | POA: Diagnosis not present

## 2017-03-26 DIAGNOSIS — G4730 Sleep apnea, unspecified: Secondary | ICD-10-CM | POA: Diagnosis not present

## 2017-03-26 DIAGNOSIS — I2510 Atherosclerotic heart disease of native coronary artery without angina pectoris: Secondary | ICD-10-CM | POA: Diagnosis not present

## 2017-03-26 DIAGNOSIS — I130 Hypertensive heart and chronic kidney disease with heart failure and stage 1 through stage 4 chronic kidney disease, or unspecified chronic kidney disease: Secondary | ICD-10-CM | POA: Diagnosis not present

## 2017-03-26 DIAGNOSIS — N183 Chronic kidney disease, stage 3 (moderate): Secondary | ICD-10-CM | POA: Diagnosis not present

## 2017-03-26 DIAGNOSIS — I5022 Chronic systolic (congestive) heart failure: Secondary | ICD-10-CM | POA: Diagnosis not present

## 2017-04-11 ENCOUNTER — Telehealth: Payer: Self-pay | Admitting: Internal Medicine

## 2017-04-11 NOTE — Telephone Encounter (Signed)
Pt husdand called stating the Pt condition is getting hard to handle and worse and would like home health nurses to start coming to their house Please advise

## 2017-04-14 NOTE — Telephone Encounter (Signed)
Ok Thx 

## 2017-04-14 NOTE — Telephone Encounter (Signed)
Okay to do?

## 2017-05-13 ENCOUNTER — Ambulatory Visit: Payer: Medicare HMO | Admitting: Cardiovascular Disease

## 2017-05-21 ENCOUNTER — Encounter (HOSPITAL_COMMUNITY): Payer: Self-pay | Admitting: Emergency Medicine

## 2017-05-21 ENCOUNTER — Emergency Department (HOSPITAL_COMMUNITY)
Admission: EM | Admit: 2017-05-21 | Discharge: 2017-05-21 | Disposition: A | Discharge status: Home / Self Care | Payer: Medicare HMO | Attending: Emergency Medicine | Admitting: Emergency Medicine

## 2017-05-21 DIAGNOSIS — N183 Chronic kidney disease, stage 3 (moderate): Secondary | ICD-10-CM | POA: Diagnosis not present

## 2017-05-21 DIAGNOSIS — Z955 Presence of coronary angioplasty implant and graft: Secondary | ICD-10-CM | POA: Insufficient documentation

## 2017-05-21 DIAGNOSIS — I130 Hypertensive heart and chronic kidney disease with heart failure and stage 1 through stage 4 chronic kidney disease, or unspecified chronic kidney disease: Secondary | ICD-10-CM | POA: Diagnosis not present

## 2017-05-21 DIAGNOSIS — G309 Alzheimer's disease, unspecified: Secondary | ICD-10-CM | POA: Insufficient documentation

## 2017-05-21 DIAGNOSIS — I5042 Chronic combined systolic (congestive) and diastolic (congestive) heart failure: Secondary | ICD-10-CM | POA: Insufficient documentation

## 2017-05-21 DIAGNOSIS — F0280 Dementia in other diseases classified elsewhere without behavioral disturbance: Secondary | ICD-10-CM | POA: Insufficient documentation

## 2017-05-21 DIAGNOSIS — Z7984 Long term (current) use of oral hypoglycemic drugs: Secondary | ICD-10-CM | POA: Diagnosis not present

## 2017-05-21 DIAGNOSIS — E1122 Type 2 diabetes mellitus with diabetic chronic kidney disease: Secondary | ICD-10-CM | POA: Insufficient documentation

## 2017-05-21 DIAGNOSIS — Z7982 Long term (current) use of aspirin: Secondary | ICD-10-CM | POA: Insufficient documentation

## 2017-05-21 DIAGNOSIS — R112 Nausea with vomiting, unspecified: Secondary | ICD-10-CM

## 2017-05-21 DIAGNOSIS — N289 Disorder of kidney and ureter, unspecified: Secondary | ICD-10-CM | POA: Insufficient documentation

## 2017-05-21 LAB — COMPREHENSIVE METABOLIC PANEL
ALBUMIN: 3.7 g/dL (ref 3.5–5.0)
ALT: 11 U/L — AB (ref 14–54)
ANION GAP: 12 (ref 5–15)
AST: 14 U/L — ABNORMAL LOW (ref 15–41)
Alkaline Phosphatase: 50 U/L (ref 38–126)
BUN: 57 mg/dL — ABNORMAL HIGH (ref 6–20)
CHLORIDE: 100 mmol/L — AB (ref 101–111)
CO2: 27 mmol/L (ref 22–32)
Calcium: 9.6 mg/dL (ref 8.9–10.3)
Creatinine, Ser: 2.73 mg/dL — ABNORMAL HIGH (ref 0.44–1.00)
GFR calc Af Amer: 17 mL/min — ABNORMAL LOW (ref >60)
GFR calc non Af Amer: 14 mL/min — ABNORMAL LOW (ref >60)
GLUCOSE: 170 mg/dL — AB (ref 65–99)
Potassium: 4.2 mmol/L (ref 3.5–5.1)
SODIUM: 139 mmol/L (ref 135–145)
Total Bilirubin: 0.9 mg/dL (ref 0.3–1.2)
Total Protein: 7.2 g/dL (ref 6.5–8.1)

## 2017-05-21 LAB — CBC
HCT: 33.7 % — ABNORMAL LOW (ref 36.0–46.0)
HEMOGLOBIN: 11.2 g/dL — AB (ref 12.0–15.0)
MCH: 31.5 pg (ref 26.0–34.0)
MCHC: 33.2 g/dL (ref 30.0–36.0)
MCV: 94.9 fL (ref 78.0–100.0)
Platelets: 157 10*3/uL (ref 150–400)
RBC: 3.55 MIL/uL — ABNORMAL LOW (ref 3.87–5.11)
RDW: 11.9 % (ref 11.5–15.5)
WBC: 7.5 10*3/uL (ref 4.0–10.5)

## 2017-05-21 LAB — CBG MONITORING, ED: GLUCOSE-CAPILLARY: 166 mg/dL — AB (ref 65–99)

## 2017-05-21 LAB — URINALYSIS, ROUTINE W REFLEX MICROSCOPIC
BILIRUBIN URINE: NEGATIVE
Glucose, UA: NEGATIVE mg/dL
HGB URINE DIPSTICK: NEGATIVE
KETONES UR: NEGATIVE mg/dL
Leukocytes, UA: NEGATIVE
Nitrite: NEGATIVE
PROTEIN: NEGATIVE mg/dL
Specific Gravity, Urine: 1.01 (ref 1.005–1.030)
pH: 5 (ref 5.0–8.0)

## 2017-05-21 LAB — LIPASE, BLOOD: Lipase: 57 U/L — ABNORMAL HIGH (ref 11–51)

## 2017-05-21 MED ORDER — ONDANSETRON HCL 4 MG PO TABS: 4.0000 mg | ORAL_TABLET | Freq: Four times a day (QID) | ORAL | 0 refills | Status: DC

## 2017-05-21 MED ORDER — SODIUM CHLORIDE 0.9 % IV BOLUS (SEPSIS)
500.0000 mL | Freq: Once | INTRAVENOUS | Status: AC
  Administered 2017-05-21: 500 mL via INTRAVENOUS

## 2017-05-21 MED ORDER — SODIUM CHLORIDE 0.9 % IV BOLUS (SEPSIS): 1000.0000 mL | Freq: Once | INTRAVENOUS | Status: DC

## 2017-05-21 MED ORDER — ONDANSETRON HCL 4 MG/2ML IJ SOLN
4.0000 mg | Freq: Once | INTRAMUSCULAR | Status: AC
  Administered 2017-05-21: 4 mg via INTRAVENOUS
  Filled 2017-05-21: qty 2

## 2017-05-21 NOTE — Discharge Instructions (Signed)
Your kidney function is elevated. This will need to be rechecked in 1-2 weeks. Increase fluids. Prescription for nausea medicine.

## 2017-05-21 NOTE — ED Triage Notes (Signed)
Pt reports 1 episode of emesis today, poor appetite for several months. Denies D/, dysuria, or abd pain.

## 2017-05-21 NOTE — ED Notes (Signed)
Pt encouraged to get a urine specimen

## 2017-05-21 NOTE — ED Notes (Signed)
Urine specimen obtained and sent to lab

## 2017-05-21 NOTE — ED Notes (Signed)
Pt and family updated on wait, state understanding.

## 2017-05-21 NOTE — ED Provider Notes (Signed)
[facility] DEPT Provider Note   CSN: [id number] Arrival date & time: 05/21/17  1250     History   Chief Complaint Chief Complaint  Patient presents with  . Emesis    HPI Mary Cook is a 83 y.o. female.  Level V caveat for mild dementia. Most of history obtained from husband and sister. Patient has vomited a couple times today and feels nauseated. No diarrhea, fever, sweats, chills, dysuria, chest pain, dyspnea.   Poor appetite for several months. Severity is mild to moderate. Nothing makes symptoms better or worse. She has multiple chronic health problems.      Past Medical History:  Diagnosis Date  . CAD (coronary artery disease)    a. 04/2010 MI DES x 2 to LCX;  b. 05/2014 Cath: EF 35%, patent LCX stents w/ 70% stenosis in small AV groove LCX, Diag 50-60, RCA 50-60p->Med Rx.  . Cerebral aneurysm   . Chronic combined systolic (congestive) and diastolic (congestive) heart failure ([place])    a. 01/2015 Echo: EF 30-35%, sev inflat HK, Gr 1DD, mild AI.  Marland Kitchen Diverticulosis of colon   . Essential hypertension   . Frequent urination   . GIB (gastrointestinal bleeding)    a. 2002 Diverticular bleed.  . Hyperlipidemia   . Ischemic cardiomyopathy    a. 01/2015 Echo: EF 30-35%.  . Osteoarthritis   . Osteopenia   . Skin disorder   . Sleep apnea    cpap therapy  . Stroke ([place]) 2002  . T12 compression fracture ([place])    a. 04/2015.  Marland Kitchen Thrombus   . Type II diabetes mellitus ([place])   . Visual disorder     Patient Active Problem List   Diagnosis Date Noted  . Traumatic subarachnoid hemorrhage with loss of consciousness of 30 minutes or less ([place])   . Anemia of chronic disease 09/06/2016  . Gastroenteritis 07/22/2016  . Hip pain, chronic 06/03/2016  . Dyspnea 11/15/2015  . Acute respiratory failure with hypoxia ([place]) 11/15/2015  . CHF exacerbation ([place]) 11/15/2015  . Pain in the chest   . Chronic systolic CHF (congestive heart failure) ([place])   . Ischemic  cardiomyopathy   . T12 compression fracture ([place])   . Intertrigo 02/28/2015  . Actinic keratoses 02/28/2015  . Noncompliance with diet and medication regimen 02/28/2015  . LBP (low back pain) 02/13/2015  . Acute on chronic renal insufficiency 02/04/2015  . Cardiomyopathy, ischemic-EF 30-35% by echo 01/31/15 02/04/2015  . Chronic kidney disease stage III 02/03/2015  . Acute on chronic combined systolic and diastolic CHF, NYHA class 3 ([place]) 01/30/2015  . Cystitis 10/28/2014  . Insomnia 10/28/2014  . Alzheimer's disease 10/28/2014  . Falls frequently 10/12/2014  . Leg weakness, bilateral 10/12/2014  . OSA on CPAP 09/05/2014  . Rapid palpitations 06/07/2014  . Acute chest pain 06/06/2014  . Chest pain 06/06/2014  . Thoracic back pain after fall  04/13/2013  . Stress at home 01/26/2013  . GERD (gastroesophageal reflux disease) 01/25/2013  . Abnormal EKG, marked new ant TWI this adm with negative Troponins,08/18/11 08/20/2011  . CAD S/P CFX PCI Sept 2011 08/18/2011  . Sleep apnea, cpap had been stopped secondary to "sinus issues" 08/18/2011  . [name] deficiency 03/20/2010  . Diabetes mellitus ([place]) 01/07/2008  . Hyperlipidemia 01/07/2008  . Essential hypertension 04/25/2007  . DIVERTICULOSIS, COLON 04/25/2007  . OSTEOARTHRITIS 04/25/2007  . Disorder of bone and cartilage 04/25/2007  . DIVERTICULITIS, HX OF 04/25/2007    Past Surgical History:  Procedure Laterality Date  .  ABDOMINAL HYSTERECTOMY  1980   no bso  . APPENDECTOMY    . CARDIAC CATHETERIZATION  07/2011   LAD OK, D1 80%, CFX 30% w/ patent stent, RCA 50-60%, EF 40%  . CARDIAC CATHETERIZATION    . CORONARY ANGIOPLASTY WITH STENT PLACEMENT  05/11/2010   stent CFX  . DOPPLER ECHOCARDIOGRAPHY    . LEFT HEART CATHETERIZATION WITH CORONARY ANGIOGRAM N/A 08/18/2011   Procedure: LEFT HEART CATHETERIZATION WITH CORONARY ANGIOGRAM;  Surgeon: Wellington Hampshire, MD;  Location: [facility] CATH LAB;  Service: Cardiovascular;  Laterality: N/A;  .  LEFT HEART CATHETERIZATION WITH CORONARY ANGIOGRAM N/A 06/08/2014   Procedure: LEFT HEART CATHETERIZATION WITH CORONARY ANGIOGRAM;  Surgeon: Troy Sine, MD;  Location: [facility] CATH LAB;  Service: Cardiovascular;  Laterality: N/A;  . myocardial perfusion study    . THROMBECTOMY    . TONSILLECTOMY      OB History    No data available       Home Medications    Prior to Admission medications   Medication Sig Start Date End Date Taking? Authorizing Provider  amLODipine (NORVASC) 5 MG tablet Take 5 mg by mouth daily.   Yes [provider]  aspirin 81 MG EC tablet Take 81 mg by mouth daily.     Yes [provider]  carvedilol (COREG) 6.25 MG tablet Take 1 tablet (6.25 mg total) by mouth 2 (two) times daily with a meal. Patient taking differently: Take 12.5 mg by mouth 2 (two) times daily with a meal.  01/25/17  Yes Thurnell Lose, MD  citalopram (CELEXA) 20 MG tablet TAKE 1 TABLET EVERY DAY 12/12/16  Yes Plotnikov, Evie Lacks, MD  digoxin (LANOXIN) 0.125 MG tablet Take 0.0625 mg by mouth daily.   Yes [provider]  donepezil (ARICEPT) 5 MG tablet Take 1 tablet (5 mg total) by mouth daily. 12/12/16  Yes Plotnikov, Evie Lacks, MD  furosemide (LASIX) 40 MG tablet Take 0.5 tablets (20 mg total) by mouth daily. Patient taking differently: Take 20 mg by mouth daily as needed for fluid.  12/06/16  Yes Troy Sine, MD  isosorbide mononitrate (IMDUR) 60 MG 24 hr tablet Take 1 tablet (60 mg total) by mouth daily. 12/06/16  Yes Troy Sine, MD  metFORMIN (GLUCOPHAGE) 500 MG tablet Take 1 tablet (500 mg total) by mouth 2 (two) times daily with a meal. Patient taking differently: Take 500 mg by mouth daily with breakfast.  01/26/17  Yes Thurnell Lose, MD  potassium chloride SA (KLOR-CON M20) 20 MEQ tablet Take 1 tablet (20 mEq total) by mouth daily. 03/18/17  Yes Plotnikov, Evie Lacks, MD  sacubitril-valsartan (ENTRESTO) 49-51 MG Take 1 tablet by mouth 2 (two) times daily.  12/12/16  Yes Plotnikov, Evie Lacks, MD  b complex vitamins tablet Take 1 tablet by mouth daily. 08/16/16   Plotnikov, Evie Lacks, MD  Blood Glucose Monitoring Suppl (ACCU-CHEK AVIVA PLUS) w/Device KIT Use to check blood sugars twice a day Dx. E11.9 09/30/16   Plotnikov, Evie Lacks, MD  niMODipine (NIMOTOP) 30 MG capsule Take 2 capsules (60 mg total) by mouth every 4 (four) hours. Patient not taking: Reported on 05/21/2017 01/25/17 02/15/17  Thurnell Lose, MD  ondansetron (ZOFRAN) 4 MG tablet Take 1 tablet (4 mg total) by mouth every 6 (six) hours. 05/21/17   Nat Christen, MD    Family History Family History  Problem Relation Age of Onset  . Hypertension Other   . CAD Unknown  father    Social History Social History  Substance Use Topics  . Smoking status: Never Smoker  . Smokeless tobacco: Never Used  . Alcohol use No     Allergies   Clopidogrel bisulfate; Lipitor [atorvastatin]; Namenda [memantine hcl]; and Lisinopril   Review of Systems Review of Systems  Unable to perform ROS: Dementia     Physical Exam Updated Vital Signs BP (!) 127/49   Pulse 72   Temp 98.3 F (36.8 C) (Oral)   Resp 19   Wt 65.8 kg (145 lb)   SpO2 96%   BMI 24.13 kg/m   Physical Exam  Constitutional:  No acute distress  HENT:  Head: Normocephalic and atraumatic.  Eyes: Conjunctivae are normal.  Neck: Neck supple.  Cardiovascular: Normal rate and regular rhythm.   Pulmonary/Chest: Effort normal and breath sounds normal.  Abdominal: Soft. Bowel sounds are normal.  Musculoskeletal: Normal range of motion.  Neurological: She is alert.  Skin: Skin is warm and dry.  Psychiatric:  Flat affect  Nursing note and vitals reviewed.    ED Treatments / Results  Labs (all labs ordered are listed, but only abnormal results are displayed) Labs Reviewed  LIPASE, BLOOD - Abnormal; Notable for the following:       Result Value   Lipase 57 (*)    All other components within normal limits    COMPREHENSIVE METABOLIC PANEL - Abnormal; Notable for the following:    Chloride 100 (*)    Glucose, Bld 170 (*)    BUN 57 (*)    Creatinine, Ser 2.73 (*)    AST 14 (*)    ALT 11 (*)    GFR calc non Af Amer 14 (*)    GFR calc Af Amer 17 (*)    All other components within normal limits  CBC - Abnormal; Notable for the following:    RBC 3.55 (*)    Hemoglobin 11.2 (*)    HCT 33.7 (*)    All other components within normal limits  URINALYSIS, ROUTINE W REFLEX MICROSCOPIC - Abnormal; Notable for the following:    APPearance HAZY (*)    All other components within normal limits  CBG MONITORING, ED - Abnormal; Notable for the following:    Glucose-Capillary 166 (*)    All other components within normal limits    EKG  EKG Interpretation None       Radiology No results found.  Procedures Procedures (including critical care time)  Medications Ordered in ED Medications  ondansetron (ZOFRAN) injection 4 mg (4 mg Intravenous Given 05/21/17 1630)  sodium chloride 0.9 % bolus 500 mL (0 mLs Intravenous Stopped 05/21/17 1953)  sodium chloride 0.9 % bolus 500 mL (0 mLs Intravenous Stopped 05/21/17 1952)     Initial Impression / Assessment and Plan / ED Course  I have reviewed the triage vital signs and the nursing notes.  Pertinent labs & imaging results that were available during my care of the patient were reviewed by me and considered in my medical decision making (see chart for details).     No acute abdomen noted. Patient feels much better after IV fluids and IV Zofran. Creatinine noted to be elevated.  I encouraged the patient and her husband to get primary care follow-up and recheck her creatinine in 1-2 weeks.  Discharge medications Zofran 4 mg  Final Clinical Impressions(s) / ED Diagnoses   Final diagnoses:  Nausea and vomiting, intractability of vomiting not specified, unspecified vomiting type  Renal insufficiency  New Prescriptions Discharge Medication List as  of 05/21/2017  7:58 PM    START taking these medications   Details  ondansetron (ZOFRAN) 4 MG tablet Take 1 tablet (4 mg total) by mouth every 6 (six) hours., Starting Wed 05/21/2017, Print         Nat Christen, MD 05/21/17 2055

## 2017-05-27 ENCOUNTER — Inpatient Hospital Stay: Payer: Medicare HMO | Admitting: Internal Medicine

## 2017-06-05 ENCOUNTER — Encounter: Payer: Self-pay | Admitting: Internal Medicine

## 2017-06-05 ENCOUNTER — Ambulatory Visit (INDEPENDENT_AMBULATORY_CARE_PROVIDER_SITE_OTHER): Payer: Medicare HMO | Admitting: Internal Medicine

## 2017-06-05 VITALS — BP 126/54 | HR 57 | Temp 97.9°F | Ht 65.0 in | Wt 140.0 lb

## 2017-06-05 DIAGNOSIS — E538 Deficiency of other specified B group vitamins: Secondary | ICD-10-CM | POA: Diagnosis not present

## 2017-06-05 DIAGNOSIS — E0800 Diabetes mellitus due to underlying condition with hyperosmolarity without nonketotic hyperglycemic-hyperosmolar coma (NKHHC): Secondary | ICD-10-CM

## 2017-06-05 DIAGNOSIS — I10 Essential (primary) hypertension: Secondary | ICD-10-CM | POA: Diagnosis not present

## 2017-06-05 DIAGNOSIS — G301 Alzheimer's disease with late onset: Secondary | ICD-10-CM | POA: Diagnosis not present

## 2017-06-05 DIAGNOSIS — F0281 Dementia in other diseases classified elsewhere with behavioral disturbance: Secondary | ICD-10-CM | POA: Diagnosis not present

## 2017-06-05 DIAGNOSIS — I5022 Chronic systolic (congestive) heart failure: Secondary | ICD-10-CM

## 2017-06-05 DIAGNOSIS — I255 Ischemic cardiomyopathy: Secondary | ICD-10-CM | POA: Diagnosis not present

## 2017-06-05 DIAGNOSIS — Z23 Encounter for immunization: Secondary | ICD-10-CM | POA: Diagnosis not present

## 2017-06-05 MED ORDER — CYANOCOBALAMIN 1000 MCG/ML IJ SOLN
1000.0000 ug | Freq: Once | INTRAMUSCULAR | Status: AC
  Administered 2017-06-05: 1000 ug via INTRAMUSCULAR

## 2017-06-05 NOTE — Assessment & Plan Note (Signed)
Presumably on Coreg, Digoxin, Isosorbide, Lasix, Entresto Mr Mcleroy is c/o pt's noncompliance w/meds Risks associated with treatment noncompliance were discussed. Compliance was encouraged.

## 2017-06-05 NOTE — Assessment & Plan Note (Signed)
Presumably on Coreg, Digoxin, Isosorbide, Lasix, Entresto Mary Cook is c/o pt's noncompliance w/meds Risks associated with treatment noncompliance were discussed. Compliance was encouraged.  Labs Flu shot

## 2017-06-05 NOTE — Assessment & Plan Note (Signed)
[name] inj 

## 2017-06-05 NOTE — Assessment & Plan Note (Signed)
Progressing sx's NHP discussed w/husband

## 2017-06-05 NOTE — Assessment & Plan Note (Signed)
Labs

## 2017-06-05 NOTE — Assessment & Plan Note (Addendum)
Presumably on Coreg, Isosorbide, Lasix, Entresto Mr [place] is c/o pt's noncompliance w/meds Risks associated with treatment noncompliance were discussed. Compliance was encouraged.

## 2017-06-05 NOTE — Progress Notes (Signed)
Subjective:  Patient ID: Mary Cook, female    DOB: 01/11/1926  Age: 83 y.o. MRN: [id number]  CC: No chief complaint on file.   HPI [employer] presents for worsening dementia, incontinence, HTN, CAD, CHF f/u Eating out twice a day   Outpatient Medications Prior to Visit  Medication Sig Dispense Refill  . amLODipine (NORVASC) 5 MG tablet Take 5 mg by mouth daily.    Marland Kitchen aspirin 81 MG EC tablet Take 81 mg by mouth daily.      Marland Kitchen b complex vitamins tablet Take 1 tablet by mouth daily. 100 tablet 3  . Blood Glucose Monitoring Suppl (ACCU-CHEK AVIVA PLUS) w/Device KIT Use to check blood sugars twice a day Dx. E11.9 1 kit 0  . carvedilol (COREG) 6.25 MG tablet Take 1 tablet (6.25 mg total) by mouth 2 (two) times daily with a meal. (Patient taking differently: Take 12.5 mg by mouth 2 (two) times daily with a meal. ) 60 tablet 0  . citalopram (CELEXA) 20 MG tablet TAKE 1 TABLET EVERY DAY 90 tablet 2  . digoxin (LANOXIN) 0.125 MG tablet Take 0.0625 mg by mouth daily.    Marland Kitchen donepezil (ARICEPT) 5 MG tablet Take 1 tablet (5 mg total) by mouth daily. 90 tablet 3  . furosemide (LASIX) 40 MG tablet Take 0.5 tablets (20 mg total) by mouth daily. (Patient taking differently: Take 20 mg by mouth daily as needed for fluid. ) 30 tablet 0  . isosorbide mononitrate (IMDUR) 60 MG 24 hr tablet Take 1 tablet (60 mg total) by mouth daily. 30 tablet 0  . metFORMIN (GLUCOPHAGE) 500 MG tablet Take 1 tablet (500 mg total) by mouth 2 (two) times daily with a meal. (Patient taking differently: Take 500 mg by mouth daily with breakfast. )    . ondansetron (ZOFRAN) 4 MG tablet Take 1 tablet (4 mg total) by mouth every 6 (six) hours. 12 tablet 0  . potassium chloride SA (KLOR-CON M20) 20 MEQ tablet Take 1 tablet (20 mEq total) by mouth daily. 90 tablet 3  . sacubitril-valsartan (ENTRESTO) 49-51 MG Take 1 tablet by mouth 2 (two) times daily. 180 tablet 3  . niMODipine (NIMOTOP) 30 MG capsule Take 2 capsules  (60 mg total) by mouth every 4 (four) hours. (Patient not taking: Reported on 05/21/2017) 252 capsule 0   No facility-administered medications prior to visit.     ROS Review of Systems  Constitutional: Positive for fatigue. Negative for activity change, appetite change, chills and unexpected weight change.  HENT: Negative for congestion, mouth sores and sinus pressure.   Eyes: Negative for visual disturbance.  Respiratory: Negative for cough and chest tightness.   Gastrointestinal: Negative for abdominal pain and nausea.  Genitourinary: Positive for enuresis. Negative for difficulty urinating, frequency and vaginal pain.  Musculoskeletal: Positive for arthralgias. Negative for back pain and gait problem.  Skin: Negative for pallor and rash.  Neurological: Positive for dizziness and weakness. Negative for tremors, numbness and headaches.  Psychiatric/Behavioral: Positive for behavioral problems, confusion and decreased concentration. Negative for sleep disturbance and suicidal ideas.    Objective:  BP (!) 126/54 (BP Location: Left Arm, Patient Position: Sitting, Cuff Size: Normal)   Pulse (!) 57   Temp 97.9 F (36.6 C) (Oral)   Ht '5\' 5"'$  (1.651 m)   Wt 140 lb (63.5 kg)   SpO2 98%   BMI 23.30 kg/m   BP Readings from Last 3 Encounters:  06/05/17 (!) 126/54  05/21/17 (!) 127/49  03/18/17 136/72    Wt Readings from Last 3 Encounters:  06/05/17 140 lb (63.5 kg)  05/21/17 145 lb (65.8 kg)  03/18/17 142 lb (64.4 kg)    Physical Exam  Constitutional: She appears well-developed. No distress.  HENT:  Head: Normocephalic.  Right Ear: External ear normal.  Left Ear: External ear normal.  Nose: Nose normal.  Mouth/Throat: Oropharynx is clear and moist.  Eyes: Pupils are equal, round, and reactive to light. Conjunctivae are normal. Right eye exhibits no discharge. Left eye exhibits no discharge.  Neck: Normal range of motion. Neck supple. No JVD present. No tracheal deviation  present. No thyromegaly present.  Cardiovascular: Normal rate, regular rhythm and normal heart sounds.   Pulmonary/Chest: No stridor. No respiratory distress. She has no wheezes.  Abdominal: Soft. Bowel sounds are normal. She exhibits no distension and no mass. There is no tenderness. There is no rebound and no guarding.  Musculoskeletal: She exhibits no edema or tenderness.  Lymphadenopathy:    She has no cervical adenopathy.  Neurological: She displays normal reflexes. No cranial nerve deficit. She exhibits normal muscle tone. Coordination abnormal.  Skin: No rash noted. No erythema.  Flat affect Confused  Lab Results  Component Value Date   WBC 7.5 05/21/2017   HGB 11.2 (L) 05/21/2017   HCT 33.7 (L) 05/21/2017   PLT 157 05/21/2017   GLUCOSE 170 (H) 05/21/2017   CHOL 230 (H) 11/16/2015   TRIG 97 11/16/2015   HDL 68 11/16/2015   LDLDIRECT 180.7 12/09/2011   LDLCALC 143 (H) 11/16/2015   ALT 11 (L) 05/21/2017   AST 14 (L) 05/21/2017   NA 139 05/21/2017   K 4.2 05/21/2017   CL 100 (L) 05/21/2017   CREATININE 2.73 (H) 05/21/2017   BUN 57 (H) 05/21/2017   CO2 27 05/21/2017   TSH 1.500 01/25/2017   INR 1.01 11/15/2015   HGBA1C 6.1 (H) 01/23/2017    No results found.  Assessment & Plan:   There are no diagnoses linked to this encounter. I am having Ms. Seyer maintain her aspirin, b complex vitamins, ACCU-CHEK AVIVA PLUS, furosemide, isosorbide mononitrate, citalopram, sacubitril-valsartan, donepezil, metFORMIN, niMODipine, carvedilol, potassium chloride SA, amLODipine, digoxin, and ondansetron.  No orders of the defined types were placed in this encounter.    Follow-up: No Follow-up on file.  Walker Kehr, MD

## 2017-06-05 NOTE — Addendum Note (Signed)
Addended by: Karren Cobble on: 06/05/2017 01:22 PM   Modules accepted: Orders

## 2017-06-16 ENCOUNTER — Telehealth: Payer: Self-pay | Admitting: Internal Medicine

## 2017-06-16 DIAGNOSIS — I509 Heart failure, unspecified: Secondary | ICD-10-CM

## 2017-06-16 DIAGNOSIS — F0281 Dementia in other diseases classified elsewhere with behavioral disturbance: Secondary | ICD-10-CM

## 2017-06-16 DIAGNOSIS — G301 Alzheimer's disease with late onset: Secondary | ICD-10-CM

## 2017-06-16 NOTE — Telephone Encounter (Signed)
Vicente Males Solicitor) has some questions about pt and would like nurse to give her a call back   Best number [phone]  Ext [phone]

## 2017-06-18 ENCOUNTER — Ambulatory Visit: Payer: Medicare HMO | Admitting: Internal Medicine

## 2017-06-19 NOTE — Telephone Encounter (Signed)
Spoke with Mary Cook with adult social services and she states there was a disturbance between pt and husband and was asking if pt has missed any appointments, is taking her medications, and is we thought is was still okay for pt to live at home or if its time for pt to live in a facility.

## 2017-07-10 NOTE — Telephone Encounter (Signed)
Mary Cook, friend called [phone]  She said that pt does not have any of her meds.  She they are both forgetting about the appts and she said they both need help.  She stated that pt is basically laying in bed dying

## 2017-07-10 NOTE — Telephone Encounter (Signed)
Please advise what to do about pt

## 2017-07-11 NOTE — Telephone Encounter (Signed)
Would it help to have hospice involved? Does Mary Cook think APS should be involved? Husband has  dementia as well. Thx

## 2017-07-11 NOTE — Telephone Encounter (Signed)
LM with [name] to call back

## 2017-07-11 NOTE — Telephone Encounter (Signed)
OK - Hospice ref was placed Thx

## 2017-07-11 NOTE — Telephone Encounter (Signed)
Mary Cook called back and was given Dr. Camila Li response, she agrees they need help ASAP, they do not have heat, they do not take their medications, she lays in bed all day and does not eat.  She would like hospice or a Education officer, museum to got out and help them,  Her sister is aware but the sister does not want to make them believe she is pushing them into a nursing home  Please advise on Hospice Referral, Home health servies and social worker referral

## 2017-07-14 NOTE — Telephone Encounter (Signed)
[name] called back, she has been informed of the notes. She understood. She just wanted to know when they would be coming out. She was informed they would call and set that up.

## 2017-07-21 NOTE — Telephone Encounter (Signed)
[name] the friend called back and wanted to know when Hospice would be going out, I said we would not have a way of knowing that and I gave her the phone number to hospice to call

## 2017-07-30 ENCOUNTER — Telehealth: Payer: Self-pay | Admitting: *Deleted

## 2017-07-30 NOTE — Telephone Encounter (Signed)
PA submitted via Covermymeds for [employer]

## 2017-08-07 ENCOUNTER — Telehealth: Payer: Self-pay | Admitting: Cardiovascular Disease

## 2017-08-07 NOTE — Telephone Encounter (Signed)
Will forward to RN with Dr Claiborne Billings

## 2017-08-07 NOTE — Telephone Encounter (Signed)
Mary Cook ( Cover My Meds) is calling because there was a error message sent and he has updated the member ID ( which was incorrect ) and you can go back onto the website and resubmit the same key which is [place]. Please call if you have any questions . Thanks

## 2017-08-18 NOTE — Telephone Encounter (Signed)
PA resubmitted via CovermyMeds

## 2017-08-20 ENCOUNTER — Telehealth: Payer: Self-pay | Admitting: Internal Medicine

## 2017-08-20 MED ORDER — POTASSIUM CHLORIDE CRYS ER 20 MEQ PO TBCR: 20.0000 meq | EXTENDED_RELEASE_TABLET | Freq: Every day | ORAL | 3 refills | Status: DC

## 2017-08-20 MED ORDER — NIMODIPINE 30 MG PO CAPS: 60.0000 mg | ORAL_CAPSULE | ORAL | 0 refills | Status: DC

## 2017-08-20 MED ORDER — DIGOXIN 125 MCG PO TABS: 0.0625 mg | ORAL_TABLET | Freq: Every day | ORAL | 30 refills | Status: DC

## 2017-08-20 MED ORDER — AMLODIPINE BESYLATE 5 MG PO TABS: 5.0000 mg | ORAL_TABLET | Freq: Every day | ORAL | 30 refills | Status: DC

## 2017-08-20 NOTE — Telephone Encounter (Signed)
Copied from [place] [id number]. Topic: Quick Communication - See Telephone Encounter >> Aug 20, 2017 11:17 AM Aurelio Brash B wrote: CRM for notification. See Telephone encounter for:  Refill  amlodipine, digoxin, potassium and nimodipine   Pt is out of medication .Marland Kitchen  [facility] 08/20/17.  If an issue with refill call care taker [phone]

## 2017-08-20 NOTE — Telephone Encounter (Signed)
Amlodipine OV: 06/05/17  Digoxin OV:06/05/17  Potassium OV 03/18/17  LR: 03/18/17 90# 3 refills Changing pharmacy to [facility] Nimodipine OV:03/18/17

## 2017-08-20 NOTE — Telephone Encounter (Signed)
[facility] had a question with the Digoxin and Amlodipine dispensing.   It looks like 1 tablet of each was ordered for 30 refills.   Made the correction that it was 30 pills of each to be refilled X1.

## 2017-09-05 ENCOUNTER — Ambulatory Visit: Payer: Medicare HMO | Admitting: Internal Medicine

## 2017-09-15 ENCOUNTER — Ambulatory Visit: Payer: Self-pay

## 2017-09-15 MED ORDER — SACUBITRIL-VALSARTAN 49-51 MG PO TABS: 1.0000 | ORAL_TABLET | Freq: Two times a day (BID) | ORAL | 0 refills | Status: DC

## 2017-09-15 NOTE — Telephone Encounter (Signed)
Patient's husband called in wanting to get a new prescription for Entresto to be sent to [facility] in [place], which is listed on the patient's profile. He said they have switched from [facility].He said she has been out of the Entresto x 2 days and he said she needs it right away. I informed him the office will be made aware and [facility].  I asked him if he would like to schedule her 3 month follow up as noted at her last OV, since the appointment was missed, he agreed and appointment made for 09/23/17.

## 2017-09-15 NOTE — Telephone Encounter (Signed)
RX sent

## 2017-09-23 ENCOUNTER — Ambulatory Visit: Payer: Medicare HMO | Admitting: Internal Medicine

## 2017-09-23 DIAGNOSIS — Z0289 Encounter for other administrative examinations: Secondary | ICD-10-CM

## 2017-10-07 ENCOUNTER — Other Ambulatory Visit: Payer: Self-pay | Admitting: Cardiovascular Disease

## 2017-10-09 ENCOUNTER — Other Ambulatory Visit: Payer: Self-pay | Admitting: Cardiovascular Disease

## 2017-10-09 NOTE — Telephone Encounter (Signed)
REFILL 

## 2017-10-10 ENCOUNTER — Other Ambulatory Visit: Payer: Self-pay | Admitting: Internal Medicine

## 2017-10-25 ENCOUNTER — Other Ambulatory Visit: Payer: Self-pay | Admitting: Cardiovascular Disease

## 2017-10-28 ENCOUNTER — Other Ambulatory Visit: Payer: Self-pay | Admitting: Cardiovascular Disease

## 2017-10-28 MED ORDER — ISOSORBIDE MONONITRATE ER 60 MG PO TB24: 60.0000 mg | ORAL_TABLET | Freq: Every day | ORAL | 0 refills | Status: DC

## 2017-10-28 NOTE — Addendum Note (Signed)
Addended by: Leanord Asal T on: 10/28/2017 07:17 AM   Modules accepted: Orders

## 2017-12-10 ENCOUNTER — Other Ambulatory Visit: Payer: Self-pay | Admitting: Internal Medicine

## 2017-12-10 ENCOUNTER — Other Ambulatory Visit: Payer: Self-pay | Admitting: Cardiovascular Disease

## 2017-12-11 ENCOUNTER — Other Ambulatory Visit: Payer: Self-pay | Admitting: Cardiovascular Disease

## 2017-12-11 NOTE — Telephone Encounter (Signed)
REFILL 

## 2018-01-16 ENCOUNTER — Encounter: Payer: Self-pay | Admitting: Nurse Practitioner

## 2018-01-16 ENCOUNTER — Ambulatory Visit (INDEPENDENT_AMBULATORY_CARE_PROVIDER_SITE_OTHER): Payer: Medicare HMO | Admitting: Nurse Practitioner

## 2018-01-16 ENCOUNTER — Other Ambulatory Visit (INDEPENDENT_AMBULATORY_CARE_PROVIDER_SITE_OTHER): Payer: Medicare HMO

## 2018-01-16 VITALS — BP 116/62 | HR 67 | Temp 97.5°F | Resp 16 | Ht 65.0 in | Wt 122.8 lb

## 2018-01-16 DIAGNOSIS — F0280 Dementia in other diseases classified elsewhere without behavioral disturbance: Secondary | ICD-10-CM

## 2018-01-16 DIAGNOSIS — E08649 Diabetes mellitus due to underlying condition with hypoglycemia without coma: Secondary | ICD-10-CM | POA: Diagnosis not present

## 2018-01-16 DIAGNOSIS — G301 Alzheimer's disease with late onset: Secondary | ICD-10-CM

## 2018-01-16 LAB — COMPREHENSIVE METABOLIC PANEL
ALK PHOS: 61 U/L (ref 39–117)
ALT: 6 U/L (ref 0–35)
AST: 8 U/L (ref 0–37)
Albumin: 3.5 g/dL (ref 3.5–5.2)
BILIRUBIN TOTAL: 0.3 mg/dL (ref 0.2–1.2)
BUN: 34 mg/dL — AB (ref 6–23)
CO2: 28 mEq/L (ref 19–32)
Calcium: 9.6 mg/dL (ref 8.4–10.5)
Chloride: 105 mEq/L (ref 96–112)
Creatinine, Ser: 2.22 mg/dL — ABNORMAL HIGH (ref 0.40–1.20)
GFR: 22.07 mL/min — ABNORMAL LOW (ref >60.00)
GLUCOSE: 143 mg/dL — AB (ref 70–99)
Potassium: 4.3 mEq/L (ref 3.5–5.1)
Sodium: 141 mEq/L (ref 135–145)
TOTAL PROTEIN: 6.8 g/dL (ref 6.0–8.3)

## 2018-01-16 LAB — CBC
HCT: 31 % — ABNORMAL LOW (ref 36.0–46.0)
Hemoglobin: 10.6 g/dL — ABNORMAL LOW (ref 12.0–15.0)
MCHC: 34.1 g/dL (ref 30.0–36.0)
MCV: 94.2 fl (ref 78.0–100.0)
PLATELETS: 233 10*3/uL (ref 150.0–400.0)
RBC: 3.29 Mil/uL — ABNORMAL LOW (ref 3.87–5.11)
RDW: 13.2 % (ref 11.5–15.5)
WBC: 6.7 10*3/uL (ref 4.0–10.5)

## 2018-01-16 NOTE — Progress Notes (Signed)
Name: LOLETHA BERTINI   MRN: [id number]    DOB: 28-Jul-1926   Date:01/16/2018       Progress Note  Subjective  Chief Complaint  Chief Complaint  Patient presents with  . Weight Loss    weak, sick on stomach, loss of appetite, has been losing weight    HPI Ms Gilkey is here today for evaluation of weight loss. She is accompanied today by her sisters who urged her to come for OV today. Her sisters are very concerned with the patients home living situation. She lives at home with her husband, who also has mild dementia. The sisters are worried that the patient is not eating, lays in bed all day, and has fallen several times on items piled up in the hallway. Per her sisters, it sounds like the patients husband collects items and piles them around the house and the patient falls over these items when she tries to walk without her walker. The patients husband administers her medications, and the sisters have no idea if she is taking the right medications or not. Her sisters are asking for her medications to be reviewed and concerned that she should not live in her home any longer. The patient states she does not want to move out of her house. She says she wants to stay at home with her husband. The patient reports weakness, nausea and dry heaves, which she says are not new for her. She denies loss of consciousness, chest pain, shortness of breath, vomiting, abdominal pain, dysuria, hematuria, rectal bleeding, pain.  Wt Readings from Last 3 Encounters:  01/16/18 122 lb 12.8 oz (55.7 kg)  06/05/17 140 lb (63.5 kg)  05/21/17 145 lb (65.8 kg)     Patient Active Problem List   Diagnosis Date Noted  . Traumatic subarachnoid hemorrhage with loss of consciousness of 30 minutes or less ([place])   . Anemia of chronic disease 09/06/2016  . Gastroenteritis 07/22/2016  . Hip pain, chronic 06/03/2016  . Dyspnea 11/15/2015  . Acute respiratory failure with hypoxia ([place]) 11/15/2015  . CHF exacerbation  ([place]) 11/15/2015  . Pain in the chest   . Chronic systolic CHF (congestive heart failure) ([place])   . Ischemic cardiomyopathy   . T12 compression fracture ([place])   . Intertrigo 02/28/2015  . Actinic keratoses 02/28/2015  . Noncompliance with diet and medication regimen 02/28/2015  . LBP (low back pain) 02/13/2015  . Acute on chronic renal insufficiency 02/04/2015  . Cardiomyopathy, ischemic-EF 30-35% by echo 01/31/15 02/04/2015  . Chronic kidney disease stage III 02/03/2015  . Acute on chronic combined systolic and diastolic CHF, NYHA class 3 ([place]) 01/30/2015  . Cystitis 10/28/2014  . Insomnia 10/28/2014  . Alzheimer's disease 10/28/2014  . Falls frequently 10/12/2014  . Leg weakness, bilateral 10/12/2014  . OSA on CPAP 09/05/2014  . Rapid palpitations 06/07/2014  . Acute chest pain 06/06/2014  . Chest pain 06/06/2014  . Thoracic back pain after fall  04/13/2013  . Stress at home 01/26/2013  . GERD (gastroesophageal reflux disease) 01/25/2013  . Abnormal EKG, marked new ant TWI this adm with negative Troponins,08/18/11 08/20/2011  . CAD S/P CFX PCI Sept 2011 08/18/2011  . Sleep apnea, cpap had been stopped secondary to "sinus issues" 08/18/2011  . [name] deficiency 03/20/2010  . Diabetes mellitus ([place]) 01/07/2008  . Hyperlipidemia 01/07/2008  . Essential hypertension 04/25/2007  . DIVERTICULOSIS, COLON 04/25/2007  . OSTEOARTHRITIS 04/25/2007  . Disorder of bone and cartilage 04/25/2007  . DIVERTICULITIS, HX OF 04/25/2007  Social History   Tobacco Use  . Smoking status: Never Smoker  . Smokeless tobacco: Never Used  Substance Use Topics  . Alcohol use: No     Current Outpatient Medications:  .  amLODipine (NORVASC) 5 MG tablet, Take 1 tablet (5 mg total) by mouth daily., Disp: 90 tablet, Rfl: 1 .  aspirin 81 MG EC tablet, Take 81 mg by mouth daily.  , Disp: , Rfl:  .  b complex vitamins tablet, Take 1 tablet by mouth daily., Disp: 100 tablet, Rfl: 3 .  Blood Glucose  Monitoring Suppl (ACCU-CHEK AVIVA PLUS) w/Device KIT, Use to check blood sugars twice a day Dx. E11.9, Disp: 1 kit, Rfl: 0 .  carvedilol (COREG) 12.5 MG tablet, Take 1 tablet (12.5 mg total) by mouth 2 (two) times daily with a meal. Patient needs office visit before refills will be given, Disp: 180 tablet, Rfl: 0 .  carvedilol (COREG) 6.25 MG tablet, Take 1 tablet (6.25 mg total) by mouth 2 (two) times daily with a meal. (Patient taking differently: Take 12.5 mg by mouth 2 (two) times daily with a meal. ), Disp: 60 tablet, Rfl: 0 .  citalopram (CELEXA) 20 MG tablet, TAKE 1 TABLET EVERY DAY, Disp: 90 tablet, Rfl: 2 .  digoxin (LANOXIN) 0.125 MG tablet, Take a half tablet by mouth daily. Patient needs office visit before refills will be given, Disp: 45 tablet, Rfl: 0 .  donepezil (ARICEPT) 5 MG tablet, Take 1 tablet (5 mg total) by mouth daily., Disp: 90 tablet, Rfl: 3 .  furosemide (LASIX) 40 MG tablet, Take 0.5 tablets (20 mg total) by mouth daily. (Patient taking differently: Take 20 mg by mouth daily as needed for fluid. ), Disp: 30 tablet, Rfl: 0 .  isosorbide mononitrate (IMDUR) 60 MG 24 hr tablet, Take 1 tablet (60 mg total) by mouth daily. NEED OV., Disp: 90 tablet, Rfl: 0 .  metFORMIN (GLUCOPHAGE) 500 MG tablet, Take 1 tablet (500 mg total) by mouth 2 (two) times daily. Patient needs office visit before refills will be given, Disp: 180 tablet, Rfl: 0 .  niMODipine (NIMOTOP) 30 MG capsule, Take 2 capsules (60 mg total) by mouth every 4 (four) hours for 21 days., Disp: 252 capsule, Rfl: 0 .  ondansetron (ZOFRAN) 4 MG tablet, Take 1 tablet (4 mg total) by mouth every 6 (six) hours., Disp: 12 tablet, Rfl: 0 .  potassium chloride SA (KLOR-CON M20) 20 MEQ tablet, Take 1 tablet (20 mEq total) by mouth daily., Disp: 90 tablet, Rfl: 3 .  sacubitril-valsartan (ENTRESTO) 49-51 MG, Take 1 tablet by mouth 2 (two) times daily., Disp: 60 tablet, Rfl: 0  Allergies  Allergen Reactions  . Clopidogrel Bisulfate  Nausea And Vomiting    Patient is not aware of this allergy  . Lipitor [Atorvastatin] Other (See Comments)    Weak legs  . Namenda [Memantine Hcl]     N/v/d  . Lisinopril Cough    Patient is not aware of this allergy    ROS  No other specific complaints in a complete review of systems (except as listed in HPI above).  Objective  Vitals:   01/16/18 1406  BP: 116/62  Pulse: 67  Resp: 16  Temp: (!) 97.5 F (36.4 C)  TempSrc: Oral  SpO2: 97%  Weight: 122 lb 12.8 oz (55.7 kg)  Height: '5\' 5"'$  (1.651 m)   Body mass index is 20.43 kg/m.  Nursing Note and Vital Signs reviewed.  Physical Exam Vital signs reviewed. Constitutional: Patient  appears well-nourished. No distress.  HEENT: head atraumatic, normocephalic, pupils equal and reactive to light, EOM's intact, neck supple without lymphadenopathy, oropharynx pink and moist without exudate Cardiovascular: Normal rate, regular rhythm, S1/S2 present.  Distal pulses intact. Pulmonary/Chest: Effort normal and breath sounds clear. No respiratory distress or retractions. Musculoskeletal: normal range of motion, no gross deformities. BUE and BLE non-tender to palpation. Abdominal: Soft and non-tender, bowel sounds present x4 quadrants.   Neurological: She is alert. No cranial nerve deficit. Coordination is abnormal. Speech and gait are normal.  Psychiatric: Patient has a flat affect, patient is pleasant but confused, answers most questions appropriately.   Assessment & Plan  1. Late onset Alzheimer's disease without behavioral disturbance I discussed this case with my supervising physician who is also this patients PCP. Historically there have been multiple resources offered to this patient for [facility] and discussions to move patient and husband to assisted living without follow through by patient or family Recommended for family to take patient to ER due to their concern and request for immediate access to resources today. The family  continues to request for intervention here today, we discussed referral to [facility] which may have been done in past but I will place again Medication reconciliation was done and it appears that all medications are  present per current list. Will update CBC, CMET today  -Red flags and when to present for emergency care or RTC including fever >101.18F, chest pain, shortness of breath, new/worsening/un-resolving symptoms, reviewed with patient at time of visit. Follow up and care instructions discussed and provided in AVS.  - CBC; Future - Comprehensive metabolic panel; Future

## 2018-01-16 NOTE — Patient Instructions (Signed)
I would recommend further evaluation at the Emergency Department if you are concerned with the safety or home situation of your sister, where there are resources such as social work available to help in these situations.

## 2018-01-22 ENCOUNTER — Encounter: Payer: Self-pay | Admitting: *Deleted

## 2018-01-22 ENCOUNTER — Other Ambulatory Visit: Payer: Self-pay | Admitting: *Deleted

## 2018-01-22 NOTE — Patient Outreach (Signed)
[facility]) Care Management  01/22/2018  [place] 1925/10/22 [id number]   Referral received via MD office on 5/28  RN spoke with pt who provider permission to speak with her spouse Clance Boll). Caregiver indicates he is the primary provider for this pt and has limited support system but get alone well in managing her care. Spouse states he takes his wife "everywhere I go". RN explained the purpose of today's call and offered assistance for case management services or any community resources to assist with caring for his spouse. RN inquired on pt's medical history with diabetes and spouse states the pt's readings have been good with no instructions or recommendations requested to perform any glucometer check. States they have never had a device to complete this task by any of her providers and does not feel it is necessary being that her provider has never mentioned it to the pt. Spouse states he is managing again the pt's care very well and does not need the offered assistance at this time but very appreciative for the call and services mentioned. RN provided RN contact name and number and again offered available home visits to assist the home further for safety for this pt or any needs regarding medication or again community resources. Spouse again appreciative but opt to decline all available services offered via [facility] services. Spouse aware the pt's primary provider will be notified of case closure and the decline for [facility] services at this time.   Raina Mina, RN Care Management Coordinator [place] Office [phone]

## 2018-02-06 ENCOUNTER — Encounter: Payer: Self-pay | Admitting: Nurse Practitioner

## 2018-03-31 ENCOUNTER — Encounter (HOSPITAL_COMMUNITY): Payer: Self-pay | Admitting: Emergency Medicine

## 2018-03-31 ENCOUNTER — Other Ambulatory Visit: Payer: Self-pay

## 2018-03-31 ENCOUNTER — Emergency Department (HOSPITAL_COMMUNITY): Payer: Medicare HMO

## 2018-03-31 ENCOUNTER — Inpatient Hospital Stay (HOSPITAL_COMMUNITY)
Admission: EM | Admit: 2018-03-31 | Discharge: 2018-04-09 | DRG: 683 | Disposition: A | Discharge status: Home / Self Care | Payer: Medicare HMO | Attending: Internal Medicine | Admitting: Internal Medicine

## 2018-03-31 DIAGNOSIS — I10 Essential (primary) hypertension: Secondary | ICD-10-CM | POA: Diagnosis present

## 2018-03-31 DIAGNOSIS — I5022 Chronic systolic (congestive) heart failure: Secondary | ICD-10-CM | POA: Diagnosis not present

## 2018-03-31 DIAGNOSIS — Z66 Do not resuscitate: Secondary | ICD-10-CM | POA: Diagnosis present

## 2018-03-31 DIAGNOSIS — R001 Bradycardia, unspecified: Secondary | ICD-10-CM | POA: Diagnosis present

## 2018-03-31 DIAGNOSIS — I252 Old myocardial infarction: Secondary | ICD-10-CM

## 2018-03-31 DIAGNOSIS — K219 Gastro-esophageal reflux disease without esophagitis: Secondary | ICD-10-CM | POA: Diagnosis not present

## 2018-03-31 DIAGNOSIS — I2510 Atherosclerotic heart disease of native coronary artery without angina pectoris: Secondary | ICD-10-CM | POA: Diagnosis present

## 2018-03-31 DIAGNOSIS — E785 Hyperlipidemia, unspecified: Secondary | ICD-10-CM | POA: Diagnosis not present

## 2018-03-31 DIAGNOSIS — E1121 Type 2 diabetes mellitus with diabetic nephropathy: Secondary | ICD-10-CM | POA: Diagnosis present

## 2018-03-31 DIAGNOSIS — G309 Alzheimer's disease, unspecified: Secondary | ICD-10-CM | POA: Diagnosis present

## 2018-03-31 DIAGNOSIS — E1122 Type 2 diabetes mellitus with diabetic chronic kidney disease: Secondary | ICD-10-CM | POA: Diagnosis present

## 2018-03-31 DIAGNOSIS — Z8249 Family history of ischemic heart disease and other diseases of the circulatory system: Secondary | ICD-10-CM | POA: Diagnosis not present

## 2018-03-31 DIAGNOSIS — I491 Atrial premature depolarization: Secondary | ICD-10-CM | POA: Diagnosis not present

## 2018-03-31 DIAGNOSIS — G4733 Obstructive sleep apnea (adult) (pediatric): Secondary | ICD-10-CM | POA: Diagnosis not present

## 2018-03-31 DIAGNOSIS — E875 Hyperkalemia: Secondary | ICD-10-CM | POA: Diagnosis present

## 2018-03-31 DIAGNOSIS — I5042 Chronic combined systolic (congestive) and diastolic (congestive) heart failure: Secondary | ICD-10-CM | POA: Diagnosis not present

## 2018-03-31 DIAGNOSIS — I959 Hypotension, unspecified: Secondary | ICD-10-CM | POA: Diagnosis present

## 2018-03-31 DIAGNOSIS — Z955 Presence of coronary angioplasty implant and graft: Secondary | ICD-10-CM | POA: Diagnosis not present

## 2018-03-31 DIAGNOSIS — E119 Type 2 diabetes mellitus without complications: Secondary | ICD-10-CM

## 2018-03-31 DIAGNOSIS — D649 Anemia, unspecified: Secondary | ICD-10-CM | POA: Diagnosis present

## 2018-03-31 DIAGNOSIS — F0280 Dementia in other diseases classified elsewhere without behavioral disturbance: Secondary | ICD-10-CM | POA: Diagnosis present

## 2018-03-31 DIAGNOSIS — E0822 Diabetes mellitus due to underlying condition with diabetic chronic kidney disease: Secondary | ICD-10-CM | POA: Diagnosis not present

## 2018-03-31 DIAGNOSIS — I5043 Acute on chronic combined systolic (congestive) and diastolic (congestive) heart failure: Secondary | ICD-10-CM | POA: Diagnosis not present

## 2018-03-31 DIAGNOSIS — M8580 Other specified disorders of bone density and structure, unspecified site: Secondary | ICD-10-CM | POA: Diagnosis present

## 2018-03-31 DIAGNOSIS — D631 Anemia in chronic kidney disease: Secondary | ICD-10-CM | POA: Diagnosis present

## 2018-03-31 DIAGNOSIS — R0689 Other abnormalities of breathing: Secondary | ICD-10-CM | POA: Diagnosis not present

## 2018-03-31 DIAGNOSIS — R4182 Altered mental status, unspecified: Secondary | ICD-10-CM | POA: Diagnosis not present

## 2018-03-31 DIAGNOSIS — N183 Chronic kidney disease, stage 3 (moderate): Secondary | ICD-10-CM | POA: Diagnosis present

## 2018-03-31 DIAGNOSIS — R531 Weakness: Secondary | ICD-10-CM | POA: Diagnosis not present

## 2018-03-31 DIAGNOSIS — R0902 Hypoxemia: Secondary | ICD-10-CM | POA: Diagnosis not present

## 2018-03-31 DIAGNOSIS — I130 Hypertensive heart and chronic kidney disease with heart failure and stage 1 through stage 4 chronic kidney disease, or unspecified chronic kidney disease: Secondary | ICD-10-CM | POA: Diagnosis not present

## 2018-03-31 DIAGNOSIS — E860 Dehydration: Secondary | ICD-10-CM

## 2018-03-31 DIAGNOSIS — G301 Alzheimer's disease with late onset: Secondary | ICD-10-CM | POA: Diagnosis not present

## 2018-03-31 DIAGNOSIS — R32 Unspecified urinary incontinence: Secondary | ICD-10-CM | POA: Diagnosis present

## 2018-03-31 DIAGNOSIS — N179 Acute kidney failure, unspecified: Principal | ICD-10-CM | POA: Diagnosis present

## 2018-03-31 DIAGNOSIS — F0281 Dementia in other diseases classified elsewhere with behavioral disturbance: Secondary | ICD-10-CM | POA: Diagnosis not present

## 2018-03-31 DIAGNOSIS — Z9989 Dependence on other enabling machines and devices: Secondary | ICD-10-CM

## 2018-03-31 DIAGNOSIS — N170 Acute kidney failure with tubular necrosis: Secondary | ICD-10-CM | POA: Diagnosis not present

## 2018-03-31 DIAGNOSIS — R410 Disorientation, unspecified: Secondary | ICD-10-CM | POA: Diagnosis not present

## 2018-03-31 LAB — URINALYSIS, ROUTINE W REFLEX MICROSCOPIC
Bilirubin Urine: NEGATIVE
Glucose, UA: NEGATIVE mg/dL
HGB URINE DIPSTICK: NEGATIVE
Ketones, ur: NEGATIVE mg/dL
Nitrite: NEGATIVE
PROTEIN: NEGATIVE mg/dL
SPECIFIC GRAVITY, URINE: 1.013 (ref 1.005–1.030)
pH: 5 (ref 5.0–8.0)

## 2018-03-31 LAB — COMPREHENSIVE METABOLIC PANEL
ALBUMIN: 4 g/dL (ref 3.5–5.0)
ALK PHOS: 57 U/L (ref 38–126)
ALT: 15 U/L (ref 0–44)
ANION GAP: 10 (ref 5–15)
AST: 18 U/L (ref 15–41)
BILIRUBIN TOTAL: 0.9 mg/dL (ref 0.3–1.2)
BUN: 87 mg/dL — ABNORMAL HIGH (ref 8–23)
CO2: 27 mmol/L (ref 22–32)
CREATININE: 3.88 mg/dL — AB (ref 0.44–1.00)
Calcium: 10 mg/dL (ref 8.9–10.3)
Chloride: 101 mmol/L (ref 98–111)
GFR calc Af Amer: 11 mL/min — ABNORMAL LOW (ref >60)
GFR calc non Af Amer: 9 mL/min — ABNORMAL LOW (ref >60)
GLUCOSE: 196 mg/dL — AB (ref 70–99)
Potassium: 5.2 mmol/L — ABNORMAL HIGH (ref 3.5–5.1)
Sodium: 138 mmol/L (ref 135–145)
TOTAL PROTEIN: 7.3 g/dL (ref 6.5–8.1)

## 2018-03-31 LAB — CBC WITH DIFFERENTIAL/PLATELET
BASOS PCT: 0 %
Basophils Absolute: 0 10*3/uL (ref 0.0–0.1)
EOS ABS: 0.1 10*3/uL (ref 0.0–0.7)
Eosinophils Relative: 1 %
HEMATOCRIT: 35.1 % — AB (ref 36.0–46.0)
Hemoglobin: 11.9 g/dL — ABNORMAL LOW (ref 12.0–15.0)
Lymphocytes Relative: 21 %
Lymphs Abs: 1.5 10*3/uL (ref 0.7–4.0)
MCH: 32 pg (ref 26.0–34.0)
MCHC: 33.9 g/dL (ref 30.0–36.0)
MCV: 94.4 fL (ref 78.0–100.0)
MONO ABS: 0.5 10*3/uL (ref 0.1–1.0)
MONOS PCT: 8 %
NEUTROS ABS: 5 10*3/uL (ref 1.7–7.7)
Neutrophils Relative %: 70 %
Platelets: 181 10*3/uL (ref 150–400)
RBC: 3.72 MIL/uL — ABNORMAL LOW (ref 3.87–5.11)
RDW: 12.6 % (ref 11.5–15.5)
WBC: 7.1 10*3/uL (ref 4.0–10.5)

## 2018-03-31 LAB — I-STAT CHEM 8, ED
BUN: 86 mg/dL — ABNORMAL HIGH (ref 8–23)
CREATININE: 3.9 mg/dL — AB (ref 0.44–1.00)
Calcium, Ion: 1.18 mmol/L (ref 1.15–1.40)
Chloride: 100 mmol/L (ref 98–111)
GLUCOSE: 191 mg/dL — AB (ref 70–99)
HCT: 34 % — ABNORMAL LOW (ref 36.0–46.0)
HEMOGLOBIN: 11.6 g/dL — AB (ref 12.0–15.0)
POTASSIUM: 4.9 mmol/L (ref 3.5–5.1)
Sodium: 137 mmol/L (ref 135–145)
TCO2: 25 mmol/L (ref 22–32)

## 2018-03-31 LAB — I-STAT TROPONIN, ED: TROPONIN I, POC: 0.02 ng/mL (ref 0.00–0.08)

## 2018-03-31 MED ORDER — HEPARIN SODIUM (PORCINE) 5000 UNIT/ML IJ SOLN
5000.0000 [IU] | Freq: Three times a day (TID) | INTRAMUSCULAR | Status: DC
  Administered 2018-04-01 – 2018-04-09 (×23): 5000 [IU] via SUBCUTANEOUS
  Filled 2018-03-31 (×23): qty 1

## 2018-03-31 MED ORDER — ONDANSETRON HCL 4 MG/2ML IJ SOLN: 4.0000 mg | Freq: Four times a day (QID) | INTRAMUSCULAR | Status: DC | PRN

## 2018-03-31 MED ORDER — SODIUM CHLORIDE 0.45 % IV SOLN
INTRAVENOUS | Status: AC
  Administered 2018-04-01 (×2): via INTRAVENOUS

## 2018-03-31 MED ORDER — ACETAMINOPHEN 650 MG RE SUPP: 650.0000 mg | Freq: Four times a day (QID) | RECTAL | Status: DC | PRN

## 2018-03-31 MED ORDER — SODIUM CHLORIDE 0.9 % IV BOLUS
1000.0000 mL | Freq: Once | INTRAVENOUS | Status: AC
  Administered 2018-03-31: 1000 mL via INTRAVENOUS

## 2018-03-31 MED ORDER — ACETAMINOPHEN 325 MG PO TABS: 650.0000 mg | ORAL_TABLET | Freq: Four times a day (QID) | ORAL | Status: DC | PRN

## 2018-03-31 MED ORDER — SODIUM CHLORIDE 0.9 % IV BOLUS: 1000.0000 mL | Freq: Once | INTRAVENOUS | Status: DC

## 2018-03-31 MED ORDER — ONDANSETRON HCL 4 MG PO TABS: 4.0000 mg | ORAL_TABLET | Freq: Four times a day (QID) | ORAL | Status: DC | PRN

## 2018-03-31 NOTE — ED Notes (Signed)
Pts sisters express concerns about pt's welfare in current living conditions. The sisters state current residence is unfit/unsanitary to live. They also state husband/current care taker has been diagnosed with dementia and is not fit to properly care for wife. Will notify MD of situation.

## 2018-03-31 NOTE — H&P (Signed)
History and Physical    Mary Cook BTD:[id number] DOB: 01-19-1926 DOA: 03/31/2018  PCP: Cassandria Anger, MD   Patient coming from: Home.  I have personally briefly reviewed patient's old medical records in [place]  Chief Complaint: AMS.  HPI: Mary Cook is a 84 y.o. female with medical history significant of CAD, cerebral aneurysm, chronic combined systolic and diastolic CHF, diverticulosis of colon, essential hypertension, history of GI bleed, hyperlipidemia, osteopenia, sleep apnea on CPAP, T12 compression fracture, history of CVA, type 2 diabetes who was brought to the emergency department due to [place].  Per husband, she has not have fever, chills, dyspnea, nausea, vomiting, diarrhea, or dysuria.  However she has urinary incontinence.  She has not been eating or drinking much recently.  Her sister called the staff and stated that her place is not fit or cleaning of for them to live there.  The patient's primary caregiver is her husband, who according to the patient's sister has dementia as well, but not as advanced.  No further history available  ED Course: Initial vital signs are, temperature is 98.6 F, pulse 52, respirations 17, blood pressure 109/45 mmHg and O2 sat 100% on room air.  She was given a 1000 mL of NS bolus.  I added another thousand mL of NS bolus.    Her urinalysis was amber color, cloudy appearance, trace leukocyte esterase with few bacteria microscopic, but otherwise unremarkable.  White count 7.1 with a normal differential, hemoglobin 11.9 g/dL and platelets 181.  Troponin level was normal.  Sodium 138, potassium 5.2, chloride 101 and CO2 27 mmol/L.  Her BUN was 87, creatinine 3.88 and glucose 196 mg/dL.  Her LFTs were within normal limits.  Digoxin level was 0.7 ng/mL.  Review of Systems: As per HPI otherwise 10 point review of systems negative.   Past Medical History:  Diagnosis Date  . CAD (coronary artery disease)    a. 04/2010  MI DES x 2 to LCX;  b. 05/2014 Cath: EF 35%, patent LCX stents w/ 70% stenosis in small AV groove LCX, Diag 50-60, RCA 50-60p->Med Rx.  . Cerebral aneurysm   . Chronic combined systolic (congestive) and diastolic (congestive) heart failure ([place])    a. 01/2015 Echo: EF 30-35%, sev inflat HK, Gr 1DD, mild AI.  Marland Kitchen Diverticulosis of colon   . Essential hypertension   . Frequent urination   . GIB (gastrointestinal bleeding)    a. 2002 Diverticular bleed.  . Hyperlipidemia   . Ischemic cardiomyopathy    a. 01/2015 Echo: EF 30-35%.  . Osteoarthritis   . Osteopenia   . Skin disorder   . Sleep apnea    cpap therapy  . Stroke ([place]) 2002  . T12 compression fracture ([place])    a. 04/2015.  Marland Kitchen Thrombus   . Type II diabetes mellitus ([place])   . Visual disorder     Past Surgical History:  Procedure Laterality Date  . ABDOMINAL HYSTERECTOMY  1980   no bso  . APPENDECTOMY    . CARDIAC CATHETERIZATION  07/2011   LAD OK, D1 80%, CFX 30% w/ patent stent, RCA 50-60%, EF 40%  . CARDIAC CATHETERIZATION    . CORONARY ANGIOPLASTY WITH STENT PLACEMENT  05/11/2010   stent CFX  . DOPPLER ECHOCARDIOGRAPHY    . LEFT HEART CATHETERIZATION WITH CORONARY ANGIOGRAM N/A 08/18/2011   Procedure: LEFT HEART CATHETERIZATION WITH CORONARY ANGIOGRAM;  Surgeon: Wellington Hampshire, MD;  Location: [facility]  CATH LAB;  Service: Cardiovascular;  Laterality: N/A;  . LEFT HEART CATHETERIZATION WITH CORONARY ANGIOGRAM N/A 06/08/2014   Procedure: LEFT HEART CATHETERIZATION WITH CORONARY ANGIOGRAM;  Surgeon: Thomas A Kelly, MD;  Location: [facility] CATH LAB;  Service: Cardiovascular;  Laterality: N/A;  . myocardial perfusion study    . THROMBECTOMY    . TONSILLECTOMY       reports that she has never smoked. She has never used smokeless tobacco. She reports that she does not drink alcohol or use drugs.  Allergies  Allergen Reactions  . Clopidogrel Bisulfate Nausea And Vomiting    Patient is not aware of this allergy  . Lipitor [Atorvastatin]  Other (See Comments)    Weak legs  . Namenda [Memantine Hcl]     N/v/d  . Lisinopril Cough    Patient is not aware of this allergy    Family History  Problem Relation Age of Onset  . Hypertension Other   . CAD Unknown        father    Prior to Admission medications   Medication Sig Start Date End Date Taking? Authorizing Provider  aspirin 81 MG EC tablet Take 81 mg by mouth daily.     Yes [provider]  carvedilol (COREG) 12.5 MG tablet Take 1 tablet (12.5 mg total) by mouth 2 (two) times daily with a meal. Patient needs office visit before refills will be given 12/11/17  Yes Plotnikov, Aleksei V, MD  digoxin (LANOXIN) 0.125 MG tablet Take a half tablet by mouth daily. Patient needs office visit before refills will be given Patient taking differently: Take 0.0625 mg by mouth daily.  12/11/17  Yes Plotnikov, Aleksei V, MD  isosorbide mononitrate (IMDUR) 60 MG 24 hr tablet Take 1 tablet (60 mg total) by mouth daily. NEED OV. 12/11/17  Yes Kelly, Thomas A, MD  metFORMIN (GLUCOPHAGE) 500 MG tablet Take 1 tablet (500 mg total) by mouth 2 (two) times daily. Patient needs office visit before refills will be given 12/11/17  Yes Plotnikov, Aleksei V, MD  potassium chloride SA (KLOR-CON M20) 20 MEQ tablet Take 1 tablet (20 mEq total) by mouth daily. 08/20/17  Yes Plotnikov, Aleksei V, MD  amLODipine (NORVASC) 5 MG tablet Take 1 tablet (5 mg total) by mouth daily. 10/10/17   Plotnikov, Aleksei V, MD  Blood Glucose Monitoring Suppl (ACCU-CHEK AVIVA PLUS) w/Device KIT Use to check blood sugars twice a day Dx. E11.9 09/30/16   Plotnikov, Aleksei V, MD  sacubitril-valsartan (ENTRESTO) 49-51 MG Take 1 tablet by mouth 2 (two) times daily. 09/15/17   Plotnikov, Aleksei V, MD    Physical Exam: Vitals:   03/31/18 2118 03/31/18 2230 03/31/18 2300 03/31/18 2330  BP:  100/78 (!) 107/44 (!) 100/46  Pulse:  (!) 52    Resp:  16 15 18  Temp: 98.6 F (37 C)     TempSrc: Rectal     SpO2:  100%      Weight:        Constitutional: Frail, elderly female.  In NAD, calm, comfortable Eyes: PERRL, lids and conjunctivae normal ENMT: Mucous membranes are mildly dry. Posterior pharynx clear of any exudate or lesions. Neck: normal, supple, no masses, no thyromegaly Respiratory: Decreased breath sounds on bases, otherwise clear to auscultation bilaterally, no wheezing, no crackles. Normal respiratory effort. No accessory muscle use.  Cardiovascular: Bradycardic at 56 bpm, no murmurs / rubs / gallops. No extremity edema. 2+ pedal pulses. No carotid bruits.  Abdomen: Soft, no tenderness, no masses   palpated. No hepatosplenomegaly. Bowel sounds positive.  Musculoskeletal: no clubbing / cyanosis. Good ROM, no contractures. Normal muscle tone.  Skin: Decreased skin turgor.  Small ecchymosis areas.   Neurologic: CN 2-12 grossly intact. Sensation intact, DTR normal. Strength 5/5 in all 4.  Psychiatric:  Alert and oriented x 1, partially oriented to place, disoriented to time, date and situation.     Labs on Admission: I have personally reviewed following labs and imaging studies  CBC: Recent Labs  Lab 03/31/18 2028 03/31/18 2041  WBC 7.1  --   NEUTROABS 5.0  --   HGB 11.9* 11.6*  HCT 35.1* 34.0*  MCV 94.4  --   PLT 181  --    Basic Metabolic Panel: Recent Labs  Lab 03/31/18 2028 03/31/18 2041  NA 138 137  K 5.2* 4.9  CL 101 100  CO2 27  --   GLUCOSE 196* 191*  BUN 87* 86*  CREATININE 3.88* 3.90*  CALCIUM 10.0  --    GFR: Estimated Creatinine Clearance: 8.4 mL/min (A) (by C-G formula based on SCr of 3.9 mg/dL (H)). Liver Function Tests: Recent Labs  Lab 03/31/18 2028  AST 18  ALT 15  ALKPHOS 57  BILITOT 0.9  PROT 7.3  ALBUMIN 4.0   No results for input(s): LIPASE, AMYLASE in the last 168 hours. No results for input(s): AMMONIA in the last 168 hours. Coagulation Profile: No results for input(s): INR, PROTIME in the last 168 hours. Cardiac Enzymes: No results for  input(s): CKTOTAL, CKMB, CKMBINDEX, TROPONINI in the last 168 hours. BNP (last 3 results) No results for input(s): PROBNP in the last 8760 hours. HbA1C: No results for input(s): HGBA1C in the last 72 hours. CBG: No results for input(s): GLUCAP in the last 168 hours. Lipid Profile: No results for input(s): CHOL, HDL, LDLCALC, TRIG, CHOLHDL, LDLDIRECT in the last 72 hours. Thyroid Function Tests: No results for input(s): TSH, T4TOTAL, FREET4, T3FREE, THYROIDAB in the last 72 hours. Anemia Panel: No results for input(s): VITAMINB12, FOLATE, FERRITIN, TIBC, IRON, RETICCTPCT in the last 72 hours. Urine analysis:    Component Value Date/Time   COLORURINE AMBER (A) 03/31/2018 2007   APPEARANCEUR CLOUDY (A) 03/31/2018 2007   LABSPEC 1.013 03/31/2018 2007   PHURINE 5.0 03/31/2018 2007   GLUCOSEU NEGATIVE 03/31/2018 2007   GLUCOSEU NEGATIVE 10/12/2014 1053   HGBUR NEGATIVE 03/31/2018 2007   BILIRUBINUR NEGATIVE 03/31/2018 2007   KETONESUR NEGATIVE 03/31/2018 2007   PROTEINUR NEGATIVE 03/31/2018 2007   UROBILINOGEN 1.0 04/28/2015 1658   NITRITE NEGATIVE 03/31/2018 2007   LEUKOCYTESUR TRACE (A) 03/31/2018 2007    Radiological Exams on Admission: Dg Chest 2 View  Result Date: 03/31/2018 CLINICAL DATA:  Weakness. EXAM: CHEST - 2 VIEW COMPARISON:  01/22/2017 FINDINGS: Heart size is normal. Prominent mitral annular calcifications. Lungs are clear without pulmonary edema. Atherosclerotic calcifications at the aortic arch. No pleural effusions. Degenerative changes in the thoracic spine. IMPRESSION: No active cardiopulmonary disease. Electronically Signed   By: Adam  Henn M.D.   On: 03/31/2018 21:01   Ct Head Wo Contrast  Result Date: 03/31/2018 CLINICAL DATA:  89-year-old with altered mental status.  Dementia. EXAM: CT HEAD WITHOUT CONTRAST TECHNIQUE: Contiguous axial images were obtained from the base of the skull through the vertex without intravenous contrast. COMPARISON:  01/24/2017 FINDINGS:  Brain: Stable atrophy. Evidence for old infarct in the inferior left cerebellum. Encephalomalacia related to an old infarct in the right parietal lobe. Again noted is evidence for an old infarct involving the   right lateral lenticulostriate distribution. Extensive low density throughout the white matter is suggestive for chronic small vessel ischemic disease. Stable ex vacuo dilatation of the right lateral ventricle. No evidence for acute hemorrhage, mass lesion, midline shift, hydrocephalus or new large infarct. Vascular: No hyperdense vessel or unexpected calcification. Skull: Normal. Negative for fracture or focal lesion. Sinuses/Orbits: No acute finding. Other: None. IMPRESSION: No acute intracranial abnormality. Stable atrophy with extensive old ischemic disease. Electronically Signed   By: Markus Daft M.D.   On: 03/31/2018 21:08    EKG: Independently reviewed.  Vent. rate 55 BPM PR interval * ms QRS duration 159 ms QT/QTc 484/463 ms P-R-T axes 0 -24 144 Sinus bradycardia Left bundle branch block Not significantly changed from previous tracings.  Assessment/Plan Principal Problem:   AKI (acute kidney injury) ([place]) Secondary to decreased oral intake. Per husband, her oral intake has decreased significantly in the past week. Continue gentle IV fluids. Monitor intake and output. Daily weights. Hold Entresto and metformin. Follow-up renal function and electrolytes. Consider further work-up and nephrology consult if no improvement.  Active Problems:   Diabetes mellitus (HCC)  Carb modified diet. Hold metformin due to AKI. CBG monitoring with sensitive RI sliding scale.    Hyperlipidemia Not on medical therapy.    Essential hypertension Hold Entresto and carvedilol. Monitor blood pressure, heart rate, renal function electrolytes.    GERD (gastroesophageal reflux disease) Protonix 40 mg p.o. daily.    OSA on CPAP Supplemental oxygen or CPAP at bedtime.    Alzheimer's  disease Supportive care.    Chronic systolic CHF (congestive heart failure) (HCC) Signs of decompensation at this time. Delene Loll has been held due to acute kidney injury. Beta-blocker and digoxin Being held due to bradycardia.    Hyperkalemia Hold Entresto. Follow-up potassium level.    Anemia Monitor hematocrit and hemoglobin.   DVT prophylaxis: Heparin SQ. Code Status: Full code. Family Communication: Her husband was present in the ED room. Disposition Plan: Observation for gentle IV hydration. Consults called: Admission status: Observation/telemetry.   Reubin Milan MD [facility] Pager [phone].  If 7PM-7AM, please contact night-coverage [email or web address] Password [email or web address]  03/31/2018, 11:46 PM

## 2018-03-31 NOTE — ED Provider Notes (Signed)
[facility] EMERGENCY DEPARTMENT Provider Note   CSN: [id number] Arrival date & time: 03/31/18  2000     History   Chief Complaint Chief Complaint  Patient presents with  . Altered Mental Status    HPI Mary Cook is a 84 y.o. female.  Patient brought in for lethargy.  She has not been eating or drinking for approximately 1 to 2 months.  She has dementia.  The history is provided by a relative and the EMS personnel. No language interpreter was used.  Altered Mental Status   This is a recurrent problem. The current episode started more than 2 days ago. The problem has not changed since onset.Associated symptoms include confusion. Risk factors: Dementia. Her past medical history is significant for seizures and hypertension.    Past Medical History:  Diagnosis Date  . CAD (coronary artery disease)    a. 04/2010 MI DES x 2 to LCX;  b. 05/2014 Cath: EF 35%, patent LCX stents w/ 70% stenosis in small AV groove LCX, Diag 50-60, RCA 50-60p->Med Rx.  . Cerebral aneurysm   . Chronic combined systolic (congestive) and diastolic (congestive) heart failure ([place])    a. 01/2015 Echo: EF 30-35%, sev inflat HK, Gr 1DD, mild AI.  Marland Kitchen Diverticulosis of colon   . Essential hypertension   . Frequent urination   . GIB (gastrointestinal bleeding)    a. 2002 Diverticular bleed.  . Hyperlipidemia   . Ischemic cardiomyopathy    a. 01/2015 Echo: EF 30-35%.  . Osteoarthritis   . Osteopenia   . Skin disorder   . Sleep apnea    cpap therapy  . Stroke ([place]) 2002  . T12 compression fracture ([place])    a. 04/2015.  Marland Kitchen Thrombus   . Type II diabetes mellitus ([place])   . Visual disorder     Patient Active Problem List   Diagnosis Date Noted  . Traumatic subarachnoid hemorrhage with loss of consciousness of 30 minutes or less ([place])   . Anemia of chronic disease 09/06/2016  . Gastroenteritis 07/22/2016  . Hip pain, chronic 06/03/2016  . Dyspnea 11/15/2015  . Acute respiratory failure with hypoxia  ([place]) 11/15/2015  . CHF exacerbation ([place]) 11/15/2015  . Pain in the chest   . Chronic systolic CHF (congestive heart failure) ([place])   . Ischemic cardiomyopathy   . T12 compression fracture ([place])   . Intertrigo 02/28/2015  . Actinic keratoses 02/28/2015  . Noncompliance with diet and medication regimen 02/28/2015  . LBP (low back pain) 02/13/2015  . Acute on chronic renal insufficiency 02/04/2015  . Cardiomyopathy, ischemic-EF 30-35% by echo 01/31/15 02/04/2015  . Chronic kidney disease stage III 02/03/2015  . Acute on chronic combined systolic and diastolic CHF, NYHA class 3 ([place]) 01/30/2015  . Cystitis 10/28/2014  . Insomnia 10/28/2014  . Alzheimer's disease 10/28/2014  . Falls frequently 10/12/2014  . Leg weakness, bilateral 10/12/2014  . OSA on CPAP 09/05/2014  . Rapid palpitations 06/07/2014  . Acute chest pain 06/06/2014  . Chest pain 06/06/2014  . Thoracic back pain after fall  04/13/2013  . Stress at home 01/26/2013  . GERD (gastroesophageal reflux disease) 01/25/2013  . Abnormal EKG, marked new ant TWI this adm with negative Troponins,08/18/11 08/20/2011  . CAD S/P CFX PCI Sept 2011 08/18/2011  . Sleep apnea, cpap had been stopped secondary to "sinus issues" 08/18/2011  . [name] deficiency 03/20/2010  . Diabetes mellitus ([place]) 01/07/2008  . Hyperlipidemia 01/07/2008  . Essential hypertension 04/25/2007  . DIVERTICULOSIS, COLON 04/25/2007  .  OSTEOARTHRITIS 04/25/2007  . Disorder of bone and cartilage 04/25/2007  . DIVERTICULITIS, HX OF 04/25/2007    Past Surgical History:  Procedure Laterality Date  . ABDOMINAL HYSTERECTOMY  1980   no bso  . APPENDECTOMY    . CARDIAC CATHETERIZATION  07/2011   LAD OK, D1 80%, CFX 30% w/ patent stent, RCA 50-60%, EF 40%  . CARDIAC CATHETERIZATION    . CORONARY ANGIOPLASTY WITH STENT PLACEMENT  05/11/2010   stent CFX  . DOPPLER ECHOCARDIOGRAPHY    . LEFT HEART CATHETERIZATION WITH CORONARY ANGIOGRAM N/A 08/18/2011   Procedure: LEFT  HEART CATHETERIZATION WITH CORONARY ANGIOGRAM;  Surgeon: Wellington Hampshire, MD;  Location: [facility] CATH LAB;  Service: Cardiovascular;  Laterality: N/A;  . LEFT HEART CATHETERIZATION WITH CORONARY ANGIOGRAM N/A 06/08/2014   Procedure: LEFT HEART CATHETERIZATION WITH CORONARY ANGIOGRAM;  Surgeon: Troy Sine, MD;  Location[facility] CATH LAB;  Service: Cardiovascular;  Laterality: N/A;  . myocardial perfusion study    . THROMBECTOMY    . TONSILLECTOMY       OB History   None      Home Medications    Prior to Admission medications   Medication Sig Start Date End Date Taking? Authorizing Provider  aspirin 81 MG EC tablet Take 81 mg by mouth daily.     Yes [provider]  amLODipine (NORVASC) 5 MG tablet Take 1 tablet (5 mg total) by mouth daily. 10/10/17   Plotnikov, Evie Lacks, MD  b complex vitamins tablet Take 1 tablet by mouth daily. 08/16/16   Plotnikov, Evie Lacks, MD  Blood Glucose Monitoring Suppl (ACCU-CHEK AVIVA PLUS) w/Device KIT Use to check blood sugars twice a day Dx. E11.9 09/30/16   Plotnikov, Evie Lacks, MD  carvedilol (COREG) 12.5 MG tablet Take 1 tablet (12.5 mg total) by mouth 2 (two) times daily with a meal. Patient needs office visit before refills will be given 12/11/17   Plotnikov, Evie Lacks, MD  digoxin (LANOXIN) 0.125 MG tablet Take a half tablet by mouth daily. Patient needs office visit before refills will be given 12/11/17   Plotnikov, Evie Lacks, MD  donepezil (ARICEPT) 5 MG tablet Take 1 tablet (5 mg total) by mouth daily. 12/12/16   Plotnikov, Evie Lacks, MD  furosemide (LASIX) 40 MG tablet Take 0.5 tablets (20 mg total) by mouth daily. Patient taking differently: Take 20 mg by mouth daily as needed for fluid.  12/06/16   Troy Sine, MD  isosorbide mononitrate (IMDUR) 60 MG 24 hr tablet Take 1 tablet (60 mg total) by mouth daily. NEED OV. 12/11/17   Troy Sine, MD  metFORMIN (GLUCOPHAGE) 500 MG tablet Take 1 tablet (500 mg total) by mouth 2 (two) times daily.  Patient needs office visit before refills will be given 12/11/17   Plotnikov, Evie Lacks, MD  potassium chloride SA (KLOR-CON M20) 20 MEQ tablet Take 1 tablet (20 mEq total) by mouth daily. 08/20/17   Plotnikov, Evie Lacks, MD  sacubitril-valsartan (ENTRESTO) 49-51 MG Take 1 tablet by mouth 2 (two) times daily. 09/15/17   Plotnikov, Evie Lacks, MD    Family History Family History  Problem Relation Age of Onset  . Hypertension Other   . CAD Unknown        father    Social History Social History   Tobacco Use  . Smoking status: Never Smoker  . Smokeless tobacco: Never Used  Substance Use Topics  . Alcohol use: No  . Drug use: No  Allergies   Clopidogrel bisulfate; Lipitor [atorvastatin]; Namenda [memantine hcl]; and Lisinopril   Review of Systems Review of Systems  Unable to perform ROS: Dementia  Psychiatric/Behavioral: Positive for confusion.     Physical Exam Updated Vital Signs BP (!) 109/45   Temp 98.6 F (37 C) (Rectal)   Resp 17   Wt 54.4 kg (120 lb)   BMI 19.97 kg/m   Physical Exam  Constitutional: She appears well-developed.  HENT:  Head: Normocephalic.  Mucous membranes dry  Eyes: Conjunctivae and EOM are normal. No scleral icterus.  Neck: Neck supple. No thyromegaly present.  Cardiovascular: Normal rate and regular rhythm. Exam reveals no gallop and no friction rub.  No murmur heard. Pulmonary/Chest: No stridor. She has no wheezes. She has no rales. She exhibits no tenderness.  Abdominal: She exhibits no distension. There is no tenderness. There is no rebound.  Musculoskeletal: Normal range of motion. She exhibits no edema.  Lymphadenopathy:    She has no cervical adenopathy.  Neurological: She is alert. She exhibits normal muscle tone. Coordination normal.  Patient oriented to person and place only.  Skin: No rash noted. No erythema.     ED Treatments / Results  Labs (all labs ordered are listed, but only abnormal results are  displayed) Labs Reviewed  CBC WITH DIFFERENTIAL/PLATELET - Abnormal; Notable for the following components:      Result Value   RBC 3.72 (*)    Hemoglobin 11.9 (*)    HCT 35.1 (*)    All other components within normal limits  COMPREHENSIVE METABOLIC PANEL - Abnormal; Notable for the following components:   Potassium 5.2 (*)    Glucose, Bld 196 (*)    BUN 87 (*)    Creatinine, Ser 3.88 (*)    GFR calc non Af Amer 9 (*)    GFR calc Af Amer 11 (*)    All other components within normal limits  URINALYSIS, ROUTINE W REFLEX MICROSCOPIC - Abnormal; Notable for the following components:   Color, Urine AMBER (*)    APPearance CLOUDY (*)    Leukocytes, UA TRACE (*)    Bacteria, UA FEW (*)    All other components within normal limits  I-STAT CHEM 8, ED - Abnormal; Notable for the following components:   BUN 86 (*)    Creatinine, Ser 3.90 (*)    Glucose, Bld 191 (*)    Hemoglobin 11.6 (*)    HCT 34.0 (*)    All other components within normal limits  URINE CULTURE  I-STAT TROPONIN, ED    EKG None  Radiology Dg Chest 2 View  Result Date: 03/31/2018 CLINICAL DATA:  Weakness. EXAM: CHEST - 2 VIEW COMPARISON:  01/22/2017 FINDINGS: Heart size is normal. Prominent mitral annular calcifications. Lungs are clear without pulmonary edema. Atherosclerotic calcifications at the aortic arch. No pleural effusions. Degenerative changes in the thoracic spine. IMPRESSION: No active cardiopulmonary disease. Electronically Signed   By: Markus Daft M.D.   On: 03/31/2018 21:01   Ct Head Wo Contrast  Result Date: 03/31/2018 CLINICAL DATA:  84 year old with altered mental status.  Dementia. EXAM: CT HEAD WITHOUT CONTRAST TECHNIQUE: Contiguous axial images were obtained from the base of the skull through the vertex without intravenous contrast. COMPARISON:  01/24/2017 FINDINGS: Brain: Stable atrophy. Evidence for old infarct in the inferior left cerebellum. Encephalomalacia related to an old infarct in the right  parietal lobe. Again noted is evidence for an old infarct involving the right lateral lenticulostriate distribution. Extensive low density  throughout the white matter is suggestive for chronic small vessel ischemic disease. Stable ex vacuo dilatation of the right lateral ventricle. No evidence for acute hemorrhage, mass lesion, midline shift, hydrocephalus or new large infarct. Vascular: No hyperdense vessel or unexpected calcification. Skull: Normal. Negative for fracture or focal lesion. Sinuses/Orbits: No acute finding. Other: None. IMPRESSION: No acute intracranial abnormality. Stable atrophy with extensive old ischemic disease. Electronically Signed   By: Markus Daft M.D.   On: 03/31/2018 21:08    Procedures Procedures (including critical care time)  Medications Ordered in ED Medications  sodium chloride 0.9 % bolus 1,000 mL (1,000 mLs Intravenous New Bag/Given 03/31/18 2032)     Initial Impression / Assessment and Plan / ED Course  I have reviewed the triage vital signs and the nursing notes.  Pertinent labs & imaging results that were available during my care of the patient were reviewed by me and considered in my medical decision making (see chart for details).     Labs show patient has an AKI.  She also may have a urinary tract infection.  Culture pending.  Diagnosis dementia with AKI.  She will be admitted to medicine  Final Clinical Impressions(s) / ED Diagnoses   Final diagnoses:  Dehydration    ED Discharge Orders    None       Milton Ferguson, MD 03/31/18 2159

## 2018-03-31 NOTE — ED Triage Notes (Signed)
[facility] called out for altered mental status. Pt with Hx of dementia lives with husband who also has dementia. Pt has no complaints at this time. Denies SOB, CP, nausea, or vomiting.

## 2018-04-01 ENCOUNTER — Encounter (HOSPITAL_COMMUNITY): Payer: Self-pay

## 2018-04-01 ENCOUNTER — Other Ambulatory Visit: Payer: Self-pay

## 2018-04-01 DIAGNOSIS — I5022 Chronic systolic (congestive) heart failure: Secondary | ICD-10-CM

## 2018-04-01 DIAGNOSIS — G301 Alzheimer's disease with late onset: Secondary | ICD-10-CM

## 2018-04-01 DIAGNOSIS — F0281 Dementia in other diseases classified elsewhere with behavioral disturbance: Secondary | ICD-10-CM

## 2018-04-01 DIAGNOSIS — E0822 Diabetes mellitus due to underlying condition with diabetic chronic kidney disease: Secondary | ICD-10-CM

## 2018-04-01 DIAGNOSIS — N183 Chronic kidney disease, stage 3 (moderate): Secondary | ICD-10-CM

## 2018-04-01 DIAGNOSIS — E860 Dehydration: Secondary | ICD-10-CM

## 2018-04-01 DIAGNOSIS — R001 Bradycardia, unspecified: Secondary | ICD-10-CM | POA: Diagnosis present

## 2018-04-01 LAB — COMPREHENSIVE METABOLIC PANEL
ALBUMIN: 3.5 g/dL (ref 3.5–5.0)
ALT: 13 U/L (ref 0–44)
ANION GAP: 12 (ref 5–15)
AST: 15 U/L (ref 15–41)
Alkaline Phosphatase: 54 U/L (ref 38–126)
BILIRUBIN TOTAL: 0.7 mg/dL (ref 0.3–1.2)
BUN: 86 mg/dL — ABNORMAL HIGH (ref 8–23)
CALCIUM: 9.5 mg/dL (ref 8.9–10.3)
CO2: 23 mmol/L (ref 22–32)
Chloride: 104 mmol/L (ref 98–111)
Creatinine, Ser: 3.59 mg/dL — ABNORMAL HIGH (ref 0.44–1.00)
GFR calc non Af Amer: 10 mL/min — ABNORMAL LOW (ref >60)
GFR, EST AFRICAN AMERICAN: 12 mL/min — AB (ref >60)
GLUCOSE: 148 mg/dL — AB (ref 70–99)
POTASSIUM: 4.6 mmol/L (ref 3.5–5.1)
Sodium: 139 mmol/L (ref 135–145)
TOTAL PROTEIN: 6.6 g/dL (ref 6.5–8.1)

## 2018-04-01 LAB — GLUCOSE, CAPILLARY
GLUCOSE-CAPILLARY: 156 mg/dL — AB (ref 70–99)
Glucose-Capillary: 118 mg/dL — ABNORMAL HIGH (ref 70–99)

## 2018-04-01 LAB — CBC
HEMATOCRIT: 33.5 % — AB (ref 36.0–46.0)
HEMOGLOBIN: 11.1 g/dL — AB (ref 12.0–15.0)
MCH: 31.4 pg (ref 26.0–34.0)
MCHC: 33.1 g/dL (ref 30.0–36.0)
MCV: 94.6 fL (ref 78.0–100.0)
Platelets: 178 10*3/uL (ref 150–400)
RBC: 3.54 MIL/uL — ABNORMAL LOW (ref 3.87–5.11)
RDW: 12.5 % (ref 11.5–15.5)
WBC: 7.4 10*3/uL (ref 4.0–10.5)

## 2018-04-01 LAB — DIGOXIN LEVEL: DIGOXIN LVL: 0.7 ng/mL — AB (ref 0.8–2.0)

## 2018-04-01 MED ORDER — METFORMIN HCL 500 MG PO TABS: 500.0000 mg | ORAL_TABLET | Freq: Two times a day (BID) | ORAL | Status: DC

## 2018-04-01 MED ORDER — LORAZEPAM 2 MG/ML IJ SOLN
0.5000 mg | Freq: Once | INTRAMUSCULAR | Status: AC
  Administered 2018-04-01: 0.5 mg via INTRAVENOUS
  Filled 2018-04-01: qty 1

## 2018-04-01 MED ORDER — INSULIN ASPART 100 UNIT/ML ~~LOC~~ SOLN
0.0000 [IU] | Freq: Three times a day (TID) | SUBCUTANEOUS | Status: DC
  Administered 2018-04-02: 1 [IU] via SUBCUTANEOUS
  Administered 2018-04-03 – 2018-04-04 (×2): 2 [IU] via SUBCUTANEOUS
  Administered 2018-04-05: 1 [IU] via SUBCUTANEOUS
  Administered 2018-04-05 – 2018-04-06 (×2): 2 [IU] via SUBCUTANEOUS
  Administered 2018-04-06 – 2018-04-07 (×2): 1 [IU] via SUBCUTANEOUS
  Administered 2018-04-08: 2 [IU] via SUBCUTANEOUS
  Administered 2018-04-08: 1 [IU] via SUBCUTANEOUS

## 2018-04-01 MED ORDER — ISOSORBIDE MONONITRATE ER 60 MG PO TB24: 60.0000 mg | ORAL_TABLET | Freq: Every day | ORAL | Status: DC

## 2018-04-01 MED ORDER — POTASSIUM CHLORIDE CRYS ER 20 MEQ PO TBCR
20.0000 meq | EXTENDED_RELEASE_TABLET | Freq: Every day | ORAL | Status: DC
  Administered 2018-04-01 – 2018-04-09 (×9): 20 meq via ORAL
  Filled 2018-04-01 (×9): qty 1

## 2018-04-01 MED ORDER — CARVEDILOL 12.5 MG PO TABS: 12.5000 mg | ORAL_TABLET | Freq: Two times a day (BID) | ORAL | Status: DC

## 2018-04-01 MED ORDER — ASPIRIN EC 81 MG PO TBEC
81.0000 mg | DELAYED_RELEASE_TABLET | Freq: Every day | ORAL | Status: DC
  Administered 2018-04-01 – 2018-04-09 (×9): 81 mg via ORAL
  Filled 2018-04-01 (×9): qty 1

## 2018-04-01 NOTE — ED Notes (Signed)
Pt continues to bend elbow and fluids will not flow when bent. I continue to remind patient/husband to keep arm straight.

## 2018-04-01 NOTE — ED Notes (Signed)
Pt becoming increasingly uncooperative. Keeps pulling off leads, will not keep on nasal canula, and keeps taking off blankets/gown.

## 2018-04-01 NOTE — Progress Notes (Signed)
PROGRESS NOTE    Mary Cook  MHD:[id number] DOB: 28-Aug-1925 DOA: 03/31/2018 PCP: Cassandria Anger, MD     Brief Narrative:  68 with PMH significant for chronic combined systolic and diastolic HF, HTN, type 2 diabetes with nephropathy, CKD stage 3, HLD and dementia; who came to ED complaining of generalized weakness and worsening mentation. Patient found to be dehydrated with acute on chronic renal failure and bradycardia.    Assessment & Plan: 1-AKI (acute kidney injury) on CKD stage 3 (HCC) -appears to be due to dehydration and pre-renal azotemia. -will continue IVF's and follow Cr trend  -continue holding nephrotoxic agents for now and make sure BP is above 211 systolic. -follow clinical response   2-Diabetes mellitus with nephropathy ([place]):  -will hold oral hypoglycemic agents and continue SSI for now -follow CBG's and adjust hypoglycemic regimen as needed. -will a1c  3-Essential hypertension -BP is soft to low currently -will continue holding antihypertensive drugs.  4-GERD (gastroesophageal reflux disease)-continue PPI  5-OSA on CPAP -patient on oxygen currently; no complaining of SOB. -will encourage CPAP QHS.  6-Alzheimer's disease -continue supportive care -no agitation during examination  7-Chronic systolic CHF (congestive heart failure) ([place]) -patient is dehydrated and volume depleted currently.  -follow daily weights, strict I's and O's and low sodium diet. -for now will hold nitrates and diuretics.    8-Hyperkalemia -now resolved; in fact with some hypokalemia now. Will follow trend. Monitor on telemetry fro another 24 hours.  9-Anemia of chronic kidney disease  -no signs of acute bleeding -no transfusion needed -will need SNF for rehab at dis    10-Sinus bradydia -will hold b-blocker and CC blockers initially.  DVT prophylaxis: heparin  Code Status: DNR Family Communication: husband and caregiver at bedside. Disposition Plan: remains  inpatient, continue IVF's, wean oxygen off, follow renal function trend.   Consultants:   None   Procedures:   See below for x-ray reports   Antimicrobials:  Anti-infectives (From admission, onward)   None       Subjective: Afebrile, no CP,  no nausea, no vomiting. Eating and drinking better. Still weak and using [place] supplementation.  Objective: Vitals:   04/01/18 0821 04/01/18 1416 04/01/18 2107 04/01/18 2118  BP:  (!) 118/49 (!) 93/48   Pulse:  (!) 49 (!) 53   Resp:  15 16   Temp:  97.7 F (36.5 C) 97.7 F (36.5 C)   TempSrc:  Oral Oral   SpO2:  100% 100% 98%  Weight: 53.8 kg (118 lb 9.7 oz)     Height: 5\' 3"  (1.6 m)       Intake/Output Summary (Last 24 hours) at 04/01/2018 2149 Last data filed at 04/01/2018 1600 Gross per 24 hour  Intake 820.8 ml  Output -  Net 820.8 ml   Filed Weights   03/31/18 2006 04/01/18 0821  Weight: 54.4 kg (120 lb) 53.8 kg (118 lb 9.7 oz)    Examination: General exam: Alert, awake, oriented x 2; able to follow simple commands and per family at bedside doing better and eating/drinking better. Respiratory system: Clear to auscultation. Respiratory effort normal. Cardiovascular system:Rate controlled, no rubs, no gallops, no JVD. Gastrointestinal system: Abdomen is nondistended, soft and nontender. No organomegaly or masses felt. Normal bowel sounds heard. Central nervous system: Alert and oriented. No focal neurological deficits. Extremities: No Cyanosis, no edema. Skin: No rashes, lesions or ulcers Psychiatry: Mood & affect appropriate.    Data Reviewed: I have personally reviewed following labs and imaging studies  CBC: Recent Labs  Lab 03/31/18 2028 03/31/18 2041 04/01/18 0453  WBC 7.1  --  7.4  NEUTROABS 5.0  --   --   HGB 11.9* 11.6* 11.1*  HCT 35.1* 34.0* 33.5*  MCV 94.4  --  94.6  PLT 181  --  784   Basic Metabolic Panel: Recent Labs  Lab 03/31/18 2028 03/31/18 2041 04/01/18 0453  NA 138 137 139  K 5.2* 4.9  4.6  CL 101 100 104  CO2 27  --  23  GLUCOSE 196* 191* 148*  BUN 87* 86* 86*  CREATININE 3.88* 3.90* 3.59*  CALCIUM 10.0  --  9.5   GFR: Estimated Creatinine Clearance: 8.8 mL/min (A) (by C-G formula based on SCr of 3.59 mg/dL (H)).   Liver Function Tests: Recent Labs  Lab 03/31/18 2028 04/01/18 0453  AST 18 15  ALT 15 13  ALKPHOS 57 54  BILITOT 0.9 0.7  PROT 7.3 6.6  ALBUMIN 4.0 3.5   CBG: Recent Labs  Lab 04/01/18 1638  GLUCAP 156*   Urine analysis:    Component Value Date/Time   COLORURINE AMBER (A) 03/31/2018 2007   APPEARANCEUR CLOUDY (A) 03/31/2018 2007   LABSPEC 1.013 03/31/2018 2007   PHURINE 5.0 03/31/2018 2007   GLUCOSEU NEGATIVE 03/31/2018 2007   GLUCOSEU NEGATIVE 10/12/2014 1053   HGBUR NEGATIVE 03/31/2018 2007   BILIRUBINUR NEGATIVE 03/31/2018 2007   KETONESUR NEGATIVE 03/31/2018 2007   PROTEINUR NEGATIVE 03/31/2018 2007   UROBILINOGEN 1.0 04/28/2015 1658   NITRITE NEGATIVE 03/31/2018 2007   LEUKOCYTESUR TRACE (A) 03/31/2018 2007     Radiology Studies: Dg Chest 2 View  Result Date: 03/31/2018 CLINICAL DATA:  Weakness. EXAM: CHEST - 2 VIEW COMPARISON:  01/22/2017 FINDINGS: Heart size is normal. Prominent mitral annular calcifications. Lungs are clear without pulmonary edema. Atherosclerotic calcifications at the aortic arch. No pleural effusions. Degenerative changes in the thoracic spine. IMPRESSION: No active cardiopulmonary disease. Electronically Signed   By: Markus Daft M.D.   On: 03/31/2018 21:01   Ct Head Wo Contrast  Result Date: 03/31/2018 CLINICAL DATA:  84 year old with altered mental status.  Dementia. EXAM: CT HEAD WITHOUT CONTRAST TECHNIQUE: Contiguous axial images were obtained from the base of the skull through the vertex without intravenous contrast. COMPARISON:  01/24/2017 FINDINGS: Brain: Stable atrophy. Evidence for old infarct in the inferior left cerebellum. Encephalomalacia related to an old infarct in the right parietal lobe.  Again noted is evidence for an old infarct involving the right lateral lenticulostriate distribution. Extensive low density throughout the white matter is suggestive for chronic small vessel ischemic disease. Stable ex vacuo dilatation of the right lateral ventricle. No evidence for acute hemorrhage, mass lesion, midline shift, hydrocephalus or new large infarct. Vascular: No hyperdense vessel or unexpected calcification. Skull: Normal. Negative for fracture or focal lesion. Sinuses/Orbits: No acute finding. Other: None. IMPRESSION: No acute intracranial abnormality. Stable atrophy with extensive old ischemic disease. Electronically Signed   By: Markus Daft M.D.   On: 03/31/2018 21:08     Scheduled Meds: . aspirin EC  81 mg Oral Daily  . heparin  5,000 Units Subcutaneous Q8H  . potassium chloride SA  20 mEq Oral Daily   Continuous Infusions: . sodium chloride 88 mL/hr at 04/01/18 2120  . sodium chloride       LOS: 1 day    Time spent: 30 minutes     Barton Dubois, MD [facility] Pager [phone]  If 7PM-7AM, please contact night-coverage [email or web address] Password [facility] 04/01/2018,  9:49 PM

## 2018-04-01 NOTE — Progress Notes (Signed)
Caregiver, Cora Collum, of pt, is very concerned about pt's living conditions and says that she needs to be in a nursing home. She states that the pt;s home has belongings piled up to the ceiling and that the husband is a hoarder and that he also has dementia and is not fit to care for the pt.

## 2018-04-02 LAB — BASIC METABOLIC PANEL
Anion gap: 6 (ref 5–15)
BUN: 69 mg/dL — AB (ref 8–23)
CHLORIDE: 106 mmol/L (ref 98–111)
CO2: 26 mmol/L (ref 22–32)
Calcium: 9.2 mg/dL (ref 8.9–10.3)
Creatinine, Ser: 2.62 mg/dL — ABNORMAL HIGH (ref 0.44–1.00)
GFR calc Af Amer: 18 mL/min — ABNORMAL LOW (ref >60)
GFR calc non Af Amer: 15 mL/min — ABNORMAL LOW (ref >60)
GLUCOSE: 110 mg/dL — AB (ref 70–99)
POTASSIUM: 4.4 mmol/L (ref 3.5–5.1)
SODIUM: 138 mmol/L (ref 135–145)

## 2018-04-02 LAB — GLUCOSE, CAPILLARY
GLUCOSE-CAPILLARY: 137 mg/dL — AB (ref 70–99)
GLUCOSE-CAPILLARY: 133 mg/dL — AB (ref 70–99)
GLUCOSE-CAPILLARY: 112 mg/dL — AB (ref 70–99)
Glucose-Capillary: 115 mg/dL — ABNORMAL HIGH (ref 70–99)

## 2018-04-02 LAB — HEMOGLOBIN A1C
HEMOGLOBIN A1C: 5.9 % — AB (ref 4.8–5.6)
Mean Plasma Glucose: 122.63 mg/dL

## 2018-04-02 MED ORDER — SODIUM CHLORIDE 0.45 % IV SOLN
INTRAVENOUS | Status: DC
  Administered 2018-04-03: 06:00:00 via INTRAVENOUS

## 2018-04-02 NOTE — Care Management Note (Signed)
Case Management Note  Patient Details  Name: Mary Cook MRN: [id number] Date of Birth: 1926/08/20  Subjective/Objective:     Admitted with AKI. From home, lives with husband who is primary caregiver. Chart documents pt's sisters have concern for husbands ability to care for pt. PT has seen pt and has determined she is at baseline and recommends HH PT. CM has dicussed this with husband at bedside who is very agreeable, he is aware they have 48 hrs to make first visit and has no preference of provider. Per husband pt has walker and all necessary DME.                Action/Plan: [facility] rep, given referral. CM has reached out to sister Cleo Stanton Kidney could not be contacted via phone and not in room). CM explained recommendations, DC plan and steps sisters could make to ensure pt's safety in the home including making APS referral. CM ensured sister we would also order CSW to [facility] services. CM will attempt to continue attempts to contact sister Stanton Kidney also prior to DC to deliver same message ans Cleo does not feel she could do so "correctly".   Expected Discharge Date:     04/02/2018             Expected Discharge Plan:  [place]  In-House Referral:  NA  Discharge planning Services  CM Consult  Post Acute Care Choice:  Home Health Choice offered to:  Spouse  DME Arranged:    DME Agency:     HH Arranged:  RN, PT, Social Work [employer] Agency:  [place]  Status of Service:  Completed, signed off  If discussed at [employer], dates discussed:    Additional Comments:  Sherald Barge, RN 04/02/2018, 1:23 PM

## 2018-04-02 NOTE — Plan of Care (Signed)
  Problem: Acute Rehab PT Goals(only PT should resolve) Goal: Pt Will Go Supine/Side To Sit Outcome: Progressing Flowsheets (Taken 04/02/2018 1413) Pt will go Supine/Side to Sit: with min guard assist Goal: Patient Will Transfer Sit To/From Stand Outcome: Progressing Flowsheets (Taken 04/02/2018 1413) Patient will transfer sit to/from stand: with min guard assist Goal: Pt Will Transfer Bed To Chair/Chair To Bed Outcome: Progressing Flowsheets (Taken 04/02/2018 1413) Pt will Transfer Bed to Chair/Chair to Bed: min guard assist Goal: Pt Will Ambulate Outcome: Progressing Flowsheets (Taken 04/02/2018 1413) Pt will Ambulate: 25 feet; with minimal assist; with rolling walker   2:14 PM, 04/02/18 Lonell Grandchild, MPT Physical Therapist with [facility] [phone] office [phone] mobile phone

## 2018-04-02 NOTE — Progress Notes (Signed)
4 different nurses have stuck patient for a new IV and have been unsuccessful. Paged Dr. Dyann Kief. He wants [place] to keep trying due to pt's kidney function.  Have called ED for a nurse attempt.

## 2018-04-02 NOTE — Evaluation (Signed)
Physical Therapy Evaluation Patient Details Name: Mary Cook MRN: [id number] DOB: October 18, 1925 Today's Date: 04/02/2018   History of Present Illness  Mary Cook is a 84 y.o. female with medical history significant of CAD, cerebral aneurysm, chronic combined systolic and diastolic CHF, diverticulosis of colon, essential hypertension, history of GI bleed, hyperlipidemia, osteopenia, sleep apnea on CPAP, T12 compression fracture, history of CVA, type 2 diabetes who was brought to the emergency department due to [place].  Per husband, she has not have fever, chills, dyspnea, nausea, vomiting, diarrhea, or dysuria.  However she has urinary incontinence.  She has not been eating or drinking much recently.  Her sister called the staff and stated that her place is not fit or cleaning of for them to live there.  The patient's primary caregiver is her husband, who according to the patient's sister has dementia as well, but not as advanced.  No further history available    Clinical Impression  Patient limited for functional mobility as stated below secondary to BLE weakness, fatigue and poor standing balance.  Patient tolerated sitting up in chair after therapy with her spouse present in room.  Patient will benefit from continued physical therapy in hospital and recommended venue below to increase strength, balance, endurance for safe ADLs and gait.     Follow Up Recommendations Home health PT;Supervision/Assistance - 24 hour;Supervision for mobility/OOB    Equipment Recommendations  None recommended by PT    Recommendations for Other Services       Precautions / Restrictions Precautions Precautions: Fall Restrictions Weight Bearing Restrictions: No      Mobility  Bed Mobility Overal bed mobility: Needs Assistance Bed Mobility: Supine to Sit     Supine to sit: Min assist        Transfers Overall transfer level: Needs assistance Equipment used: Rolling walker (2  wheeled) Transfers: Sit to/from [facility] Sit to Stand: Min assist Stand pivot transfers: Min assist;Mod assist       General transfer comment: slow labored movement  Ambulation/Gait Ambulation/Gait assistance: Min assist;Mod assist Gait Distance (Feet): 8 Feet Assistive device: Rolling walker (2 wheeled) Gait Pattern/deviations: Decreased step length - right;Decreased step length - left;Decreased stride length Gait velocity: slow   General Gait Details: limited to a 8-10 unsteady slow steps at bedside mostly due to c/o of dizziness and incontinent of urine when standing  Stairs            Wheelchair Mobility    Modified Rankin (Stroke Patients Only)       Balance Overall balance assessment: Needs assistance Sitting-balance support: Feet supported;No upper extremity supported Sitting balance-Leahy Scale: Good     Standing balance support: Bilateral upper extremity supported;During functional activity Standing balance-Leahy Scale: Fair                               Pertinent Vitals/Pain Pain Assessment: No/denies pain    Home Living Family/patient expects to be discharged to:: Private residence Living Arrangements: Spouse/significant other Available Help at Discharge: Family;Available 24 hours/day Type of Home: House Home Access: Level entry;Stairs to enter       Home Equipment: [place] - 2 wheels;Cane - single point;Wheelchair - manual;Shower seat      Prior Function Level of Independence: Needs assistance   Gait / Transfers Assistance Needed: assisted by spouse, holds her arm and walks with her, assist out of bed, to bathroom, etc.  ADL's / Homemaking Assistance Needed:  assisted by spouse        Hand Dominance        Extremity/Trunk Assessment   Upper Extremity Assessment Upper Extremity Assessment: Generalized weakness    Lower Extremity Assessment Lower Extremity Assessment: Generalized weakness     Cervical / Trunk Assessment Cervical / Trunk Assessment: Normal  Communication   Communication: No difficulties  Cognition Arousal/Alertness: Awake/alert Behavior During Therapy: WFL for tasks assessed/performed Overall Cognitive Status: Within Functional Limits for tasks assessed                                        General Comments      Exercises     Assessment/Plan    PT Assessment Patient needs continued PT services  PT Problem List Decreased strength;Decreased activity tolerance;Decreased balance;Decreased mobility       PT Treatment Interventions Gait training;Stair training;Functional mobility training;Therapeutic activities;Therapeutic exercise;Patient/family education    PT Goals (Current goals can be found in the Care Plan section)  Acute Rehab PT Goals Patient Stated Goal: return home with spouse to assist PT Goal Formulation: With patient/family Time For Goal Achievement: 04/09/18 Potential to Achieve Goals: Good    Frequency Min 3X/week   Barriers to discharge        Co-evaluation               AM-PAC PT "6 Clicks" Daily Activity  Outcome Measure Difficulty turning over in bed (including adjusting bedclothes, sheets and blankets)?: A Little Difficulty moving from lying on back to sitting on the side of the bed? : A Lot Difficulty sitting down on and standing up from a chair with arms (e.g., wheelchair, bedside commode, etc,.)?: A Lot Help needed moving to and from a bed to chair (including a wheelchair)?: A Lot Help needed walking in hospital room?: A Lot Help needed climbing 3-5 steps with a railing? : Total 6 Click Score: 12    End of Session   Activity Tolerance: Patient tolerated treatment well;Patient limited by fatigue(Patient limited secondary to dizziness) Patient left: in chair;with call bell/phone within reach;with chair alarm set;with family/visitor present Nurse Communication: Mobility status;Other  (comment)(nurisng staff aware patient left up in chair ) PT Visit Diagnosis: Unsteadiness on feet (R26.81);Other abnormalities of gait and mobility (R26.89)    Time: 6295-2841 PT Time Calculation (min) (ACUTE ONLY): 34 min   Charges:   PT Evaluation $PT Eval Moderate Complexity: 1 Mod PT Treatments $Therapeutic Activity: 23-37 mins        2:12 PM, 04/02/18 Lonell Grandchild, MPT Physical Therapist with [facility] [phone] office [phone] mobile phone w

## 2018-04-02 NOTE — Care Management (Signed)
CM has spoken with sister, Mary Cook Cook. Mary Cook verbalizes same concerns as other sister, Mary Cook. Sisters do not want to upset husband saying "he really is a good person". CM has made APS referral on their behalf. Made them aware pt will DC 8.9.19 at earliest.   Of note, sister Mary Cook Cook has asked that Mary Cook be contacted with information as Mary Cook Cook has had a CVA in the past and easily forgets.

## 2018-04-02 NOTE — Progress Notes (Signed)
PROGRESS NOTE    Mary Cook  LPF:[id number] DOB: 10-Apr-1926 DOA: 03/31/2018 PCP: Cassandria Anger, MD     Brief Narrative:  43 with PMH significant for chronic combined systolic and diastolic HF, HTN, type 2 diabetes with nephropathy, CKD stage 3, HLD and dementia; who came to ED complaining of generalized weakness and worsening mentation. Patient found to be dehydrated with acute on chronic renal failure and bradycardia.    Assessment & Plan: 1-AKI (acute kidney injury) on CKD stage 3 (HCC) -appears to be due to dehydration and pre-renal azotemia. -will continue IVF's and follow Cr trend  -continue holding nephrotoxic agents for now and make sure BP is above 532 systolic. -follow clinical response   2-Diabetes mellitus with nephropathy ([place]):  -will hold oral hypoglycemic agents and continue SSI for now -follow CBG's and adjust hypoglycemic regimen as needed. -A1C 5.9  3-Essential hypertension -BP is soft to low currently -will continue holding antihypertensive drugs.  4-GERD (gastroesophageal reflux disease)-continue PPI  5-OSA on CPAP -patient on oxygen currently; no complaining of SOB. -will encourage CPAP QHS.  6-Alzheimer's disease -continue supportive care -no agitation during examination  7-Chronic systolic CHF (congestive heart failure) ([place]) -patient is dehydrated and volume depleted currently.  -follow daily weights, strict I's and O's and low sodium diet. -for now will continue holding nitrates and diuretics.    8-Hyperkalemia -now resolved; in fact with some hypokalemia now. Will follow trend. Monitor on telemetry fro another 24 hours.  9-Anemia of chronic kidney disease  -no signs of acute bleeding. -no transfusion needed at this time. -follow Hgb trend     10-Sinus bradydia -will continue holding b-blocker and CC blockers currently.  11-social issues and weakness -APS was contacted and patient had an open case; she will need to be  placed for further/better care. -will follow CM and SW assistance at discharge  DVT prophylaxis: heparin  Code Status: DNR Family Communication: husband and caregiver at bedside. Disposition Plan: remains inpatient, continue IVF's, continue to wean oxygen off, follow renal function trend.   Consultants:   None   Procedures:   See below for x-ray reports   Antimicrobials:  Anti-infectives (From admission, onward)   None      Subjective: Afebrile, no chest pain, no nausea, no vomiting.  Patient is continued to have improvement in her oral intake.  Objective: Vitals:   04/01/18 1416 04/01/18 2107 04/01/18 2118 04/02/18 0532  BP: (!) 118/49 (!) 93/48  (!) 121/40  Pulse: (!) 49 (!) 53  93  Resp: 15 16  14   Temp: 97.7 F (36.5 C) 97.7 F (36.5 C)  97.7 F (36.5 C)  TempSrc: Oral Oral  Oral  SpO2: 100% 100% 98% 100%  Weight:      Height:        Intake/Output Summary (Last 24 hours) at 04/02/2018 1833 Last data filed at 04/02/2018 0900 Gross per 24 hour  Intake 360 ml  Output 400 ml  Net -40 ml   Filed Weights   03/31/18 2006 04/01/18 0821  Weight: 54.4 kg 53.8 kg    Examination: General exam: Alert, awake, oriented x 2; following commands and per family very close to her baseline. Patient reports improvement in her appetite and also expressed increase in her urine output.  Respiratory system: Clear to auscultation. Respiratory effort normal. Cardiovascular system:Rate controlled; no rubs, no gallops, no JVD. Gastrointestinal system: Abdomen is nondistended, soft and nontender. No organomegaly or masses felt. Normal bowel sounds heard. Central nervous system: Alert  and oriented. No focal neurological deficits. Extremities: No Cyanosis, no edema Skin: No rashes, lesions or ulcers Psychiatry: Mood & affect appropriate.    Data Reviewed: I have personally reviewed following labs and imaging studies  CBC: Recent Labs  Lab 03/31/18 2028 03/31/18 2041  04/01/18 0453  WBC 7.1  --  7.4  NEUTROABS 5.0  --   --   HGB 11.9* 11.6* 11.1*  HCT 35.1* 34.0* 33.5*  MCV 94.4  --  94.6  PLT 181  --  272   Basic Metabolic Panel: Recent Labs  Lab 03/31/18 2028 03/31/18 2041 04/01/18 0453 04/02/18 0502  NA 138 137 139 138  K 5.2* 4.9 4.6 4.4  CL 101 100 104 106  CO2 27  --  23 26  GLUCOSE 196* 191* 148* 110*  BUN 87* 86* 86* 69*  CREATININE 3.88* 3.90* 3.59* 2.62*  CALCIUM 10.0  --  9.5 9.2   GFR: Estimated Creatinine Clearance: 12 mL/min (A) (by C-G formula based on SCr of 2.62 mg/dL (H)).   Liver Function Tests: Recent Labs  Lab 03/31/18 2028 04/01/18 0453  AST 18 15  ALT 15 13  ALKPHOS 57 54  BILITOT 0.9 0.7  PROT 7.3 6.6  ALBUMIN 4.0 3.5   CBG: Recent Labs  Lab 04/01/18 1638 04/01/18 2206 04/02/18 0808 04/02/18 1130 04/02/18 1641  GLUCAP 156* 118* 115* 137* 133*   Urine analysis:    Component Value Date/Time   COLORURINE AMBER (A) 03/31/2018 2007   APPEARANCEUR CLOUDY (A) 03/31/2018 2007   LABSPEC 1.013 03/31/2018 2007   PHURINE 5.0 03/31/2018 2007   GLUCOSEU NEGATIVE 03/31/2018 2007   GLUCOSEU NEGATIVE 10/12/2014 1053   HGBUR NEGATIVE 03/31/2018 2007   BILIRUBINUR NEGATIVE 03/31/2018 2007   KETONESUR NEGATIVE 03/31/2018 2007   PROTEINUR NEGATIVE 03/31/2018 2007   UROBILINOGEN 1.0 04/28/2015 1658   NITRITE NEGATIVE 03/31/2018 2007   LEUKOCYTESUR TRACE (A) 03/31/2018 2007    Radiology Studies: Dg Chest 2 View  Result Date: 03/31/2018 CLINICAL DATA:  Weakness. EXAM: CHEST - 2 VIEW COMPARISON:  01/22/2017 FINDINGS: Heart size is normal. Prominent mitral annular calcifications. Lungs are clear without pulmonary edema. Atherosclerotic calcifications at the aortic arch. No pleural effusions. Degenerative changes in the thoracic spine. IMPRESSION: No active cardiopulmonary disease. Electronically Signed   By: Markus Daft M.D.   On: 03/31/2018 21:01   Ct Head Wo Contrast  Result Date: 03/31/2018 CLINICAL DATA:   84 year old with altered mental status.  Dementia. EXAM: CT HEAD WITHOUT CONTRAST TECHNIQUE: Contiguous axial images were obtained from the base of the skull through the vertex without intravenous contrast. COMPARISON:  01/24/2017 FINDINGS: Brain: Stable atrophy. Evidence for old infarct in the inferior left cerebellum. Encephalomalacia related to an old infarct in the right parietal lobe. Again noted is evidence for an old infarct involving the right lateral lenticulostriate distribution. Extensive low density throughout the white matter is suggestive for chronic small vessel ischemic disease. Stable ex vacuo dilatation of the right lateral ventricle. No evidence for acute hemorrhage, mass lesion, midline shift, hydrocephalus or new large infarct. Vascular: No hyperdense vessel or unexpected calcification. Skull: Normal. Negative for fracture or focal lesion. Sinuses/Orbits: No acute finding. Other: None. IMPRESSION: No acute intracranial abnormality. Stable atrophy with extensive old ischemic disease. Electronically Signed   By: Markus Daft M.D.   On: 03/31/2018 21:08     Scheduled Meds: . aspirin EC  81 mg Oral Daily  . heparin  5,000 Units Subcutaneous Q8H  . insulin aspart  0-9 Units Subcutaneous TID WC  . potassium chloride SA  20 mEq Oral Daily   Continuous Infusions: . sodium chloride    . sodium chloride       LOS: 2 days    Time spent: 30 minutes     Barton Dubois, MD [facility] Pager [phone]  If 7PM-7AM, please contact night-coverage [email or web address] Password [facility] 04/02/2018, 6:33 PM

## 2018-04-02 NOTE — Care Management (Addendum)
CM contacted by [facility] with APS. Pt has open case. Pt needs to be placed. They would like [place]. Tammy at [facility] coming in AM on 04/03/18 to see pt. CM has dicussed with CSW, MD and pt's sister cleo.

## 2018-04-03 ENCOUNTER — Other Ambulatory Visit: Payer: Self-pay | Admitting: Internal Medicine

## 2018-04-03 LAB — BASIC METABOLIC PANEL
ANION GAP: 5 (ref 5–15)
BUN: 53 mg/dL — ABNORMAL HIGH (ref 8–23)
CO2: 27 mmol/L (ref 22–32)
Calcium: 9.1 mg/dL (ref 8.9–10.3)
Chloride: 108 mmol/L (ref 98–111)
Creatinine, Ser: 2.07 mg/dL — ABNORMAL HIGH (ref 0.44–1.00)
GFR calc Af Amer: 23 mL/min — ABNORMAL LOW (ref >60)
GFR, EST NON AFRICAN AMERICAN: 20 mL/min — AB (ref >60)
GLUCOSE: 119 mg/dL — AB (ref 70–99)
POTASSIUM: 4.1 mmol/L (ref 3.5–5.1)
Sodium: 140 mmol/L (ref 135–145)

## 2018-04-03 LAB — GLUCOSE, CAPILLARY
Glucose-Capillary: 110 mg/dL — ABNORMAL HIGH (ref 70–99)
Glucose-Capillary: 157 mg/dL — ABNORMAL HIGH (ref 70–99)
Glucose-Capillary: 116 mg/dL — ABNORMAL HIGH (ref 70–99)
Glucose-Capillary: 121 mg/dL — ABNORMAL HIGH (ref 70–99)

## 2018-04-03 MED ORDER — CARVEDILOL 3.125 MG PO TABS
3.1250 mg | ORAL_TABLET | Freq: Two times a day (BID) | ORAL | Status: DC
  Administered 2018-04-03 – 2018-04-09 (×12): 3.125 mg via ORAL
  Filled 2018-04-03 (×13): qty 1

## 2018-04-03 MED ORDER — ISOSORBIDE MONONITRATE ER 30 MG PO TB24
15.0000 mg | ORAL_TABLET | Freq: Every day | ORAL | Status: DC
  Administered 2018-04-03 – 2018-04-09 (×7): 15 mg via ORAL
  Filled 2018-04-03 (×7): qty 1

## 2018-04-03 MED ORDER — TUBERCULIN PPD 5 UNIT/0.1ML ID SOLN
5.0000 [IU] | Freq: Once | INTRADERMAL | Status: AC
  Administered 2018-04-03: 5 [IU] via INTRADERMAL
  Filled 2018-04-03: qty 0.1

## 2018-04-03 NOTE — Care Management Important Message (Signed)
Important Message  Patient Details  Name: Mary Cook MRN: [id number] Date of Birth: 10-11-1925   Medicare Important Message Given:  Yes    Shelda Altes 04/03/2018, 11:31 AM

## 2018-04-03 NOTE — NC FL2 (Signed)
[place] MEDICAID FL2 LEVEL OF CARE SCREENING TOOL     IDENTIFICATION  Patient Name: Mary Cook Birthdate: Feb 10, 1926 Sex: female Admission Date (Current Location): 03/31/2018  [facility] and [place] Number:  [employer] and Address:  [facility] [address], [place]      Provider Number: [id number]  Attending Physician Name and Address:  Barton Dubois, MD  Relative Name and Phone Number:       Current Level of Care: Hospital Recommended Level of Care: [place] Prior Approval Number:    Date Approved/Denied:   PASRR Number: [id number])  Discharge Plan: SNF    Current Diagnoses: Patient Active Problem List   Diagnosis Date Noted  . Sinus bradycardia 04/01/2018  . AKI (acute kidney injury) ([place]) 03/31/2018  . Hyperkalemia 03/31/2018  . Anemia 03/31/2018  . Traumatic subarachnoid hemorrhage with loss of consciousness of 30 minutes or less ([place])   . Anemia of chronic disease 09/06/2016  . Gastroenteritis 07/22/2016  . Hip pain, chronic 06/03/2016  . Dyspnea 11/15/2015  . Acute respiratory failure with hypoxia ([place]) 11/15/2015  . CHF exacerbation ([place]) 11/15/2015  . Pain in the chest   . Chronic systolic CHF (congestive heart failure) ([place])   . Ischemic cardiomyopathy   . T12 compression fracture ([place])   . Intertrigo 02/28/2015  . Actinic keratoses 02/28/2015  . Noncompliance with diet and medication regimen 02/28/2015  . LBP (low back pain) 02/13/2015  . Acute on chronic renal insufficiency 02/04/2015  . Cardiomyopathy, ischemic-EF 30-35% by echo 01/31/15 02/04/2015  . Chronic kidney disease stage III 02/03/2015  . Acute on chronic combined systolic and diastolic CHF, NYHA class 3 ([place]) 01/30/2015  . Cystitis 10/28/2014  . Insomnia 10/28/2014  . Alzheimer's disease 10/28/2014  . Falls frequently 10/12/2014  . Leg weakness, bilateral 10/12/2014  . OSA on CPAP 09/05/2014  . Rapid  palpitations 06/07/2014  . Acute chest pain 06/06/2014  . Chest pain 06/06/2014  . Thoracic back pain after fall  04/13/2013  . Stress at home 01/26/2013  . GERD (gastroesophageal reflux disease) 01/25/2013  . Abnormal EKG, marked new ant TWI this adm with negative Troponins,08/18/11 08/20/2011  . CAD S/P CFX PCI Sept 2011 08/18/2011  . Sleep apnea, cpap had been stopped secondary to "sinus issues" 08/18/2011  . [name] deficiency 03/20/2010  . Diabetes mellitus ([place]) 01/07/2008  . Hyperlipidemia 01/07/2008  . Essential hypertension 04/25/2007  . DIVERTICULOSIS, COLON 04/25/2007  . OSTEOARTHRITIS 04/25/2007  . Disorder of bone and cartilage 04/25/2007  . DIVERTICULITIS, HX OF 04/25/2007    Orientation RESPIRATION BLADDER Height & Weight     Self  Normal Incontinent Weight: 118 lb 9.7 oz (53.8 kg) Height:  5\' 3"  (160 cm)  BEHAVIORAL SYMPTOMS/MOOD NEUROLOGICAL BOWEL NUTRITION STATUS      Incontinent Diet(heart healthy/carb modified)  AMBULATORY STATUS COMMUNICATION OF NEEDS Skin   Limited Assist Verbally Normal                       Personal Care Assistance Level of Assistance  Bathing, Feeding, Dressing Bathing Assistance: Limited assistance Feeding assistance: (P) Independent Dressing Assistance: Limited assistance     Functional Limitations Info             SPECIAL CARE FACTORS FREQUENCY                       Contractures Contractures Info: Not present    Additional Factors Info  Code Status, Allergies Code Status Info: DNR Allergies Info: Clopidogrel Bisulfate, Lipitor, Namenda, LIsinopril           Current Medications (04/03/2018):  This is the current hospital active medication list Current Facility-Administered Medications  Medication Dose Route Frequency Provider Last Rate Last Dose  . acetaminophen (TYLENOL) tablet 650 mg  650 mg Oral Q6H PRN Reubin Milan, MD       Or  . acetaminophen (TYLENOL) suppository 650 mg  650 mg Rectal Q6H  PRN Reubin Milan, MD      . aspirin EC tablet 81 mg  81 mg Oral Daily Reubin Milan, MD   81 mg at 04/03/18 0813  . carvedilol (COREG) tablet 3.125 mg  3.125 mg Oral BID WC Barton Dubois, MD      . heparin injection 5,000 Units  5,000 Units Subcutaneous Q8H Reubin Milan, MD   5,000 Units at 04/03/18 [phone]  . insulin aspart (novoLOG) injection 0-9 Units  0-9 Units Subcutaneous TID WC Barton Dubois, MD   2 Units at 04/03/18 1225  . isosorbide mononitrate (IMDUR) 24 hr tablet 15 mg  15 mg Oral Daily Barton Dubois, MD   15 mg at 04/03/18 1227  . ondansetron (ZOFRAN) tablet 4 mg  4 mg Oral Q6H PRN Reubin Milan, MD       Or  . ondansetron [facility]) injection 4 mg  4 mg Intravenous Q6H PRN Reubin Milan, MD      . potassium chloride SA (K-DUR,KLOR-CON) CR tablet 20 mEq  20 mEq Oral Daily Reubin Milan, MD   20 mEq at 04/03/18 0813  . tuberculin injection 5 Units  5 Units Intradermal Once Barton Dubois, MD   5 Units at 04/03/18 1224     Discharge Medications: Please see discharge summary for a list of discharge medications.  Relevant Imaging Results:  Relevant Lab Results:   Additional Information SSN [id number], Clydene Pugh, LCSW

## 2018-04-03 NOTE — Progress Notes (Signed)
PROGRESS NOTE    Mary Cook  DSK:[id number] DOB: 1926/02/28 DOA: 03/31/2018 PCP: Cassandria Anger, MD     Brief Narrative:  63 with PMH significant for chronic combined systolic and diastolic HF, HTN, type 2 diabetes with nephropathy, CKD stage 3, HLD and dementia; who came to ED complaining of generalized weakness and worsening mentation. Patient found to be dehydrated with acute on chronic renal failure and bradycardia.    Assessment & Plan: 1-AKI (acute kidney injury) on CKD stage 3 (HCC) -appears to be due to dehydration, decrease perfusion with low BP and pre-renal azotemia. -improved/resolved with IVF's. -continue holding entresto -will resume low dose coreg and 1/2 dose imdur -follow clinical response  -encourage to maintain adequate hydration.  2-Diabetes mellitus with nephropathy ([place]):  -will continue holding oral hypoglycemic agents and continue SSI for now -follow CBG's and adjust hypoglycemic regimen as needed. -A1C 5.9; most likely discharge on half dose of current hypoglycemic regimen   3-Essential hypertension -BP is stable and rising. -will resume low dose coreg and 1/2 dose imdur.  -follow VS  4-GERD (gastroesophageal reflux disease)-continue PPI  5-OSA on CPAP -will encourage CPAP QHS. -no complaints of SOB and off oxygen supplementation.  6-Alzheimer's disease -continue supportive care -no agitation during examination  7-Chronic systolic CHF (congestive heart failure) (HCC) -stable and compensated  -follow daily weights, strict I's and O's and low sodium diet. -will resume low dose nitrates and B-blocker -most likely resume entresto prior to discharge.    8-Hyperkalemia/hypokalemia -now resolved -will follow electrolytes trend -replete as needed -monitor for 24 more hours on telemetry   9-Anemia of chronic kidney disease  -no signs of acute bleeding. -no transfusion needed at this time. -follow Hgb trend     10-Sinus  bradydia -will discontinue calcium channel blocker -ok to use very low dose of coreg. -will monitor for 24 hours on tele.  11-social issues and weakness -APS was contacted and patient had an open case; she will need to be placed for further/better care. -will follow CM and SW assistance at discharge  DVT prophylaxis: heparin  Code Status: DNR Family Communication: husband and caregiver at bedside. Disposition Plan: remains inpatient, follow renal function trend, electrolytes and HR. Follow volume status and hydration. IVF's discontinued.  Consultants:   None   Procedures:   See below for x-ray reports   Antimicrobials:  Anti-infectives (From admission, onward)   None      Subjective: No fever, no acute distress. Patient following commands appropriately and with improved oral intake.   Objective: Vitals:   04/02/18 1400 04/02/18 1959 04/02/18 2056 04/03/18 0521  BP: (!) 109/56  135/82 (!) 123/48  Pulse: 88  (!) 55 (!) 51  Resp: 18  16 16   Temp: 97.6 F (36.4 C)  97.6 F (36.4 C) (!) 97.3 F (36.3 C)  TempSrc:      SpO2: 100% 98% (!) 89% 100%  Weight:      Height:        Intake/Output Summary (Last 24 hours) at 04/03/2018 1046 Last data filed at 04/03/2018 0500 Gross per 24 hour  Intake 300 ml  Output 1500 ml  Net -1200 ml   Filed Weights   03/31/18 2006 04/01/18 0821  Weight: 54.4 kg 53.8 kg    Examination: General exam: Alert, awake, oriented x 2; denies CP, SOB, nausea and vomiting. Eating better and in NAD. Respiratory system: Clear to auscultation. Respiratory effort normal. Cardiovascular system:S1 and S2, no rubs, no gallops; rate controlled.  Gastrointestinal system: Abdomen is nondistended, soft and nontender. No organomegaly or masses felt. Normal bowel sounds heard. Central nervous system: Alert and oriented. No focal neurological deficits. Extremities: No C/C/E, +pedal pulses Skin: No rashes, lesions or ulcers Psychiatry: Mood & affect  appropriate.    Data Reviewed: I have personally reviewed following labs and imaging studies  CBC: Recent Labs  Lab 03/31/18 2028 03/31/18 2041 04/01/18 0453  WBC 7.1  --  7.4  NEUTROABS 5.0  --   --   HGB 11.9* 11.6* 11.1*  HCT 35.1* 34.0* 33.5*  MCV 94.4  --  94.6  PLT 181  --  097   Basic Metabolic Panel: Recent Labs  Lab 03/31/18 2028 03/31/18 2041 04/01/18 0453 04/02/18 0502 04/03/18 0452  NA 138 137 139 138 140  K 5.2* 4.9 4.6 4.4 4.1  CL 101 100 104 106 108  CO2 27  --  23 26 27   GLUCOSE 196* 191* 148* 110* 119*  BUN 87* 86* 86* 69* 53*  CREATININE 3.88* 3.90* 3.59* 2.62* 2.07*  CALCIUM 10.0  --  9.5 9.2 9.1   GFR: Estimated Creatinine Clearance: 15.2 mL/min (A) (by C-G formula based on SCr of 2.07 mg/dL (H)).   Liver Function Tests: Recent Labs  Lab 03/31/18 2028 04/01/18 0453  AST 18 15  ALT 15 13  ALKPHOS 57 54  BILITOT 0.9 0.7  PROT 7.3 6.6  ALBUMIN 4.0 3.5   CBG: Recent Labs  Lab 04/02/18 0808 04/02/18 1130 04/02/18 1641 04/02/18 2059 04/03/18 0758  GLUCAP 115* 137* 133* 112* 110*   Urine analysis:    Component Value Date/Time   COLORURINE AMBER (A) 03/31/2018 2007   APPEARANCEUR CLOUDY (A) 03/31/2018 2007   LABSPEC 1.013 03/31/2018 2007   PHURINE 5.0 03/31/2018 2007   GLUCOSEU NEGATIVE 03/31/2018 2007   GLUCOSEU NEGATIVE 10/12/2014 1053   HGBUR NEGATIVE 03/31/2018 2007   BILIRUBINUR NEGATIVE 03/31/2018 2007   KETONESUR NEGATIVE 03/31/2018 2007   PROTEINUR NEGATIVE 03/31/2018 2007   UROBILINOGEN 1.0 04/28/2015 1658   NITRITE NEGATIVE 03/31/2018 2007   LEUKOCYTESUR TRACE (A) 03/31/2018 2007    Radiology Studies: No results found.   Scheduled Meds: . aspirin EC  81 mg Oral Daily  . carvedilol  3.125 mg Oral BID WC  . heparin  5,000 Units Subcutaneous Q8H  . insulin aspart  0-9 Units Subcutaneous TID WC  . isosorbide mononitrate  15 mg Oral Daily  . potassium chloride SA  20 mEq Oral Daily  . tuberculin  5 Units  Intradermal Once   Continuous Infusions:    LOS: 3 days    Time spent: 30 minutes   Barton Dubois, MD [facility] Pager [phone]  If 7PM-7AM, please contact night-coverage [email or web address] Password [email or web address] 04/03/2018, 10:46 AM

## 2018-04-03 NOTE — Clinical Social Work Note (Signed)
Mary Cook at [facility] filed for guardianship today. Patient will be medicaid pending at a SNF facility.   LCSW faxed clinicals to potential placement facilities.     Mary Cook, Mary Pugh, LCSW

## 2018-04-04 LAB — URINE CULTURE: Culture: 60000 — AB

## 2018-04-04 LAB — GLUCOSE, CAPILLARY
GLUCOSE-CAPILLARY: 114 mg/dL — AB (ref 70–99)
Glucose-Capillary: 164 mg/dL — ABNORMAL HIGH (ref 70–99)
Glucose-Capillary: 116 mg/dL — ABNORMAL HIGH (ref 70–99)
Glucose-Capillary: 158 mg/dL — ABNORMAL HIGH (ref 70–99)

## 2018-04-04 NOTE — Progress Notes (Signed)
PROGRESS NOTE    Mary Cook  CNO:[id number] DOB: [birth year] DOA: 03/31/2018 PCP: Cassandria Anger, MD     Brief Narrative:  63 with PMH significant for chronic combined systolic and diastolic HF, HTN, type 2 diabetes with nephropathy, CKD stage 3, HLD and dementia; who came to ED complaining of generalized weakness and worsening mentation. Patient found to be dehydrated with acute on chronic renal failure and bradycardia.    Assessment & Plan: 1-AKI (acute kidney injury) on CKD stage 3 (HCC) -appears to be due to dehydration, decrease perfusion with low BP and pre-renal azotemia. -improved/resolved with IVF's. -continue holding entresto for now -has tolerated resumption of low dose coreg and imdur. -follow clinical response  -encourage to maintain adequate hydration.  2-Diabetes mellitus with nephropathy ([place]):  -will continue holding oral hypoglycemic agents and continue SSI for now -follow CBG's and adjust hypoglycemic regimen as needed. -A1C 5.9; most likely discharge on half dose of current hypoglycemic regimen   3-Essential hypertension -BP is stable and well controlled. -will continue low dose coreg and 1/2 dose imdur.  -follow VS  4-GERD (gastroesophageal reflux disease)-continue PPI  5-OSA on CPAP -will encourage CPAP QHS. -no complaints of SOB and off oxygen supplementation.  6-Alzheimer's disease -continue supportive care -no agitation during examination  7-Chronic systolic CHF (congestive heart failure) (HCC) -stable and compensated  -follow daily weights, strict I's and O's and low sodium diet. -will continue low dose nitrates and B-blocker -most likely resume entresto prior to discharge.    8-Hyperkalemia/hypokalemia -now resolved -will follow electrolytes trend -replete as needed -Okay to discontinue telemetry.  9-Anemia of chronic kidney disease  -no signs of acute bleeding. -no transfusion needed at this time. -follow Hgb trend  daily    10-Sinus bradydia -will discontinue calcium channel blocker -Heart rate has tolerated well with the use of low-dose Coreg. -will discontinue telemetry.  11-social issues and weakness -APS was contacted and patient had an open case; she will need to be placed for further/better care. -will follow CM and SW assistance at discharge  DVT prophylaxis: heparin  Code Status: DNR Family Communication: husband and caregiver at bedside. Disposition Plan: remains inpatient, follow renal function trend, electrolytes and HR. Follow volume status and hydration. IVF's discontinued.  Patient is medically stable and at this moment awaiting acceptance/insurance approval to be discharged to facility.  Consultants:   None   Procedures:   See below for x-ray reports   Antimicrobials:  Anti-infectives (From admission, onward)   None      Subjective: No fever, no acute distress.  Patient denies chest pain or shortness of breath.  Good by mouth intake has been reported and she also expressed good urine output.  Patient has lost her IV at this moment.  Objective: Vitals:   04/03/18 1919 04/04/18 0531 04/04/18 0915 04/04/18 1358  BP: (!) 115/56 (!) 119/56  (!) 101/58  Pulse: (!) 59 (!) 58 62 (!) 57  Resp: 14 14  16   Temp: 98.6 F (37 C) 97.6 F (36.4 C)  98.2 F (36.8 C)  TempSrc: Oral Oral  Oral  SpO2: 99% 99%  100%  Weight:      Height:        Intake/Output Summary (Last 24 hours) at 04/04/2018 1922 Last data filed at 04/04/2018 1300 Gross per 24 hour  Intake 360 ml  Output 201 ml  Net 159 ml   Filed Weights   03/31/18 2006 04/01/18 0821  Weight: 54.4 kg 53.8 kg  Examination: General exam: Alert, awake, oriented x 2; in no acute distress and denying chest pain, shortness of breath, nausea, vomiting or any other complaints.  Continues to have good by mouth intake. Respiratory system: Clear to auscultation. Respiratory effort normal. Cardiovascular system: No murmurs,  rubs, gallops.  Rate controlled. Gastrointestinal system: Abdomen is nondistended, soft and nontender. No organomegaly or masses felt. Normal bowel sounds heard. Central nervous system: Alert and oriented. No focal neurological deficits. Extremities: No C/C/E, +pedal pulses Skin: No rashes, lesions or ulcers Psychiatry: Judgement and insight impair secondary to dementia.  Mood & affect appropriate.    Data Reviewed: I have personally reviewed following labs and imaging studies  CBC: Recent Labs  Lab 03/31/18 2028 03/31/18 2041 04/01/18 0453  WBC 7.1  --  7.4  NEUTROABS 5.0  --   --   HGB 11.9* 11.6* 11.1*  HCT 35.1* 34.0* 33.5*  MCV 94.4  --  94.6  PLT 181  --  449   Basic Metabolic Panel: Recent Labs  Lab 03/31/18 2028 03/31/18 2041 04/01/18 0453 04/02/18 0502 04/03/18 0452  NA 138 137 139 138 140  K 5.2* 4.9 4.6 4.4 4.1  CL 101 100 104 106 108  CO2 27  --  23 26 27   GLUCOSE 196* 191* 148* 110* 119*  BUN 87* 86* 86* 69* 53*  CREATININE 3.88* 3.90* 3.59* 2.62* 2.07*  CALCIUM 10.0  --  9.5 9.2 9.1   GFR: Estimated Creatinine Clearance: 15.2 mL/min (A) (by C-G formula based on SCr of 2.07 mg/dL (H)).   Liver Function Tests: Recent Labs  Lab 03/31/18 2028 04/01/18 0453  AST 18 15  ALT 15 13  ALKPHOS 57 54  BILITOT 0.9 0.7  PROT 7.3 6.6  ALBUMIN 4.0 3.5   CBG: Recent Labs  Lab 04/03/18 1650 04/03/18 2112 04/04/18 0735 04/04/18 1140 04/04/18 1628  GLUCAP 116* 121* 114* 164* 116*   Urine analysis:    Component Value Date/Time   COLORURINE AMBER (A) 03/31/2018 2007   APPEARANCEUR CLOUDY (A) 03/31/2018 2007   LABSPEC 1.013 03/31/2018 2007   PHURINE 5.0 03/31/2018 2007   GLUCOSEU NEGATIVE 03/31/2018 2007   GLUCOSEU NEGATIVE 10/12/2014 1053   HGBUR NEGATIVE 03/31/2018 2007   BILIRUBINUR NEGATIVE 03/31/2018 2007   KETONESUR NEGATIVE 03/31/2018 2007   PROTEINUR NEGATIVE 03/31/2018 2007   UROBILINOGEN 1.0 04/28/2015 1658   NITRITE NEGATIVE 03/31/2018  2007   LEUKOCYTESUR TRACE (A) 03/31/2018 2007    Radiology Studies: No results found.   Scheduled Meds: . aspirin EC  81 mg Oral Daily  . carvedilol  3.125 mg Oral BID WC  . heparin  5,000 Units Subcutaneous Q8H  . insulin aspart  0-9 Units Subcutaneous TID WC  . isosorbide mononitrate  15 mg Oral Daily  . potassium chloride SA  20 mEq Oral Daily  . tuberculin  5 Units Intradermal Once   Continuous Infusions:    LOS: 4 days    Time spent: 30 minutes   Barton Dubois, MD [facility] Pager [phone]  If 7PM-7AM, please contact night-coverage [email or web address] Password [email or web address] 04/04/2018, 7:22 PM

## 2018-04-04 NOTE — Clinical Social Work Note (Signed)
CSW spoke to Jolene Schimke, [place] supervisor, [phone], she confirmed that patient is under guardianship of [facility].  CSW presented bed offers, and she chose [facility], [place] unable to get a hold of [employer] to inform them of the bed offer acceptance.  Patient is [employer] and will need insurance approval.  CSW updated bedside nurse.  Jones Broom. Norval Morton, MSW, [place]  04/04/2018 6:35 PM

## 2018-04-05 LAB — GLUCOSE, CAPILLARY
GLUCOSE-CAPILLARY: 129 mg/dL — AB (ref 70–99)
GLUCOSE-CAPILLARY: 163 mg/dL — AB (ref 70–99)
Glucose-Capillary: 82 mg/dL (ref 70–99)
Glucose-Capillary: 121 mg/dL — ABNORMAL HIGH (ref 70–99)

## 2018-04-05 NOTE — Progress Notes (Signed)
PROGRESS NOTE    Mary Cook  WVP:[id number] DOB: [birth year] DOA: 03/31/2018 PCP: Cassandria Anger, MD     Brief Narrative:  62 with PMH significant for chronic combined systolic and diastolic HF, HTN, type 2 diabetes with nephropathy, CKD stage 3, HLD and dementia; who came to ED complaining of generalized weakness and worsening mentation. Patient found to be dehydrated with acute on chronic renal failure and bradycardia.    Assessment & Plan: 1-AKI (acute kidney injury) on CKD stage 3 (HCC) -appears to be due to dehydration, decrease perfusion with low BP and pre-renal azotemia. -improved/resolved with IVF's. -continue holding entresto for now -has tolerated resumption of low dose coreg and imdur. -follow BMET in am -encourage to maintain adequate hydration.  2-Diabetes mellitus with nephropathy ([place]):  -will continue holding oral hypoglycemic agents and continue SSI for now -follow CBG's and adjust hypoglycemic regimen as needed. -A1C 5.9; most likely discharge on half dose of current hypoglycemic regimen  And modified carb diet.  3-Essential hypertension -BP is stable and well controlled. -will continue low dose coreg and 1/2 dose imdur.  -follow VS  4-GERD (gastroesophageal reflux disease)-continue PPI  5-OSA on CPAP -encourage CPAP QHS; patient has decline use. -no complaints of SOB and no requiring oxygen supplementation.  6-Alzheimer's disease -continue supportive care -no agitation during examination; following commands and answering question appropriately.  7-Chronic systolic CHF (congestive heart failure) (HCC) -stable and compensated  -follow daily weights, strict I's and O's and low sodium diet. -will continue low dose nitrates and B-blocker -most likely resume low dose entresto at discharge.    8-Hyperkalemia/hypokalemia -now resolved -will follow electrolytes trend in am -replete as needed  9-Anemia of chronic kidney disease  -no signs of  acute bleeding. -no transfusion needed at this time. -follow Hgb trend in am    10-Sinus bradydia -will discontinue calcium channel blocker -Heart rate has tolerated well with the use of low-dose Coreg.  11-social issues and weakness -APS was contacted and patient had an open case; she will need to be placed for further/better care. -will follow CM and SW assistance at discharge  DVT prophylaxis: heparin  Code Status: DNR Family Communication: husband and caregiver at bedside. Disposition Plan: remains inpatient, follow renal function trend, electrolytes and HR. Follow volume status and hydration. IVF's discontinued.  Patient is medically stable and at this moment awaiting acceptance/insurance approval to be discharged to facility.  Consultants:   None   Procedures:   See below for x-ray reports   Antimicrobials:  Anti-infectives (From admission, onward)   None      Subjective: Afebrile, no chest pain, no shortness of breath, no acute events or distress.  Patient reports continue having good oral intake and also with good urine output.  Vital signs are stable.  Objective: Vitals:   04/04/18 2029 04/04/18 2148 04/05/18 0554 04/05/18 1346  BP:  (!) 106/55 107/68 108/90  Pulse:  60 (!) 56 60  Resp:  18 16 16   Temp:  98.4 F (36.9 C) 98 F (36.7 C)   TempSrc:  Oral Oral   SpO2: 96% 100% 100% 99%  Weight:      Height:        Intake/Output Summary (Last 24 hours) at 04/05/2018 1809 Last data filed at 04/05/2018 1200 Gross per 24 hour  Intake 480 ml  Output -  Net 480 ml   Filed Weights   03/31/18 2006 04/01/18 0821  Weight: 54.4 kg 53.8 kg    Examination: General exam:  Alert, awake, oriented x 2; in no acute distress and currently denying chest pain, shortness of breath, nausea, vomiting or any other complaints.  No overnight events reported. Respiratory system: Clear to auscultation. Respiratory effort normal. Cardiovascular system:RRR. No murmurs, rubs,  gallops. Gastrointestinal system: Abdomen is nondistended, soft and nontender. No organomegaly or masses felt. Normal bowel sounds heard. Central nervous system: Alert and oriented. No focal neurological deficits. Extremities: No C/C/E, +pedal pulses Skin: No rashes, no petechiae, lesions or ulcers Psychiatry: Judgement and insight impaired secondary to underlying dementia. Mood & affect appropriate.    Data Reviewed: I have personally reviewed following labs and imaging studies  CBC: Recent Labs  Lab 03/31/18 2028 03/31/18 2041 04/01/18 0453  WBC 7.1  --  7.4  NEUTROABS 5.0  --   --   HGB 11.9* 11.6* 11.1*  HCT 35.1* 34.0* 33.5*  MCV 94.4  --  94.6  PLT 181  --  878   Basic Metabolic Panel: Recent Labs  Lab 03/31/18 2028 03/31/18 2041 04/01/18 0453 04/02/18 0502 04/03/18 0452  NA 138 137 139 138 140  K 5.2* 4.9 4.6 4.4 4.1  CL 101 100 104 106 108  CO2 27  --  23 26 27   GLUCOSE 196* 191* 148* 110* 119*  BUN 87* 86* 86* 69* 53*  CREATININE 3.88* 3.90* 3.59* 2.62* 2.07*  CALCIUM 10.0  --  9.5 9.2 9.1   GFR: Estimated Creatinine Clearance: 15.2 mL/min (A) (by C-G formula based on SCr of 2.07 mg/dL (H)).   Liver Function Tests: Recent Labs  Lab 03/31/18 2028 04/01/18 0453  AST 18 15  ALT 15 13  ALKPHOS 57 54  BILITOT 0.9 0.7  PROT 7.3 6.6  ALBUMIN 4.0 3.5   CBG: Recent Labs  Lab 04/04/18 1628 04/04/18 2148 04/05/18 0733 04/05/18 1125 04/05/18 1541  GLUCAP 116* 158* 129* 163* 82   Urine analysis:    Component Value Date/Time   COLORURINE AMBER (A) 03/31/2018 2007   APPEARANCEUR CLOUDY (A) 03/31/2018 2007   LABSPEC 1.013 03/31/2018 2007   PHURINE 5.0 03/31/2018 2007   GLUCOSEU NEGATIVE 03/31/2018 2007   GLUCOSEU NEGATIVE 10/12/2014 1053   HGBUR NEGATIVE 03/31/2018 2007   BILIRUBINUR NEGATIVE 03/31/2018 2007   KETONESUR NEGATIVE 03/31/2018 2007   PROTEINUR NEGATIVE 03/31/2018 2007   UROBILINOGEN 1.0 04/28/2015 1658   NITRITE NEGATIVE 03/31/2018  2007   LEUKOCYTESUR TRACE (A) 03/31/2018 2007    Radiology Studies: No results found.   Scheduled Meds: . aspirin EC  81 mg Oral Daily  . carvedilol  3.125 mg Oral BID WC  . heparin  5,000 Units Subcutaneous Q8H  . insulin aspart  0-9 Units Subcutaneous TID WC  . isosorbide mononitrate  15 mg Oral Daily  . potassium chloride SA  20 mEq Oral Daily   Continuous Infusions:    LOS: 5 days    Time spent: 30 minutes   Barton Dubois, MD [facility] Pager [phone]  If 7PM-7AM, please contact night-coverage [email or web address] Password [facility] 04/05/2018, 6:09 PM

## 2018-04-06 DIAGNOSIS — I10 Essential (primary) hypertension: Secondary | ICD-10-CM

## 2018-04-06 DIAGNOSIS — K219 Gastro-esophageal reflux disease without esophagitis: Secondary | ICD-10-CM

## 2018-04-06 DIAGNOSIS — G4733 Obstructive sleep apnea (adult) (pediatric): Secondary | ICD-10-CM

## 2018-04-06 DIAGNOSIS — R001 Bradycardia, unspecified: Secondary | ICD-10-CM

## 2018-04-06 DIAGNOSIS — D631 Anemia in chronic kidney disease: Secondary | ICD-10-CM

## 2018-04-06 DIAGNOSIS — I5043 Acute on chronic combined systolic (congestive) and diastolic (congestive) heart failure: Secondary | ICD-10-CM

## 2018-04-06 DIAGNOSIS — E875 Hyperkalemia: Secondary | ICD-10-CM

## 2018-04-06 DIAGNOSIS — Z9989 Dependence on other enabling machines and devices: Secondary | ICD-10-CM

## 2018-04-06 LAB — CBC
HEMATOCRIT: 31.4 % — AB (ref 36.0–46.0)
Hemoglobin: 10.4 g/dL — ABNORMAL LOW (ref 12.0–15.0)
MCH: 31.9 pg (ref 26.0–34.0)
MCHC: 33.1 g/dL (ref 30.0–36.0)
MCV: 96.3 fL (ref 78.0–100.0)
Platelets: 160 10*3/uL (ref 150–400)
RBC: 3.26 MIL/uL — ABNORMAL LOW (ref 3.87–5.11)
RDW: 12.8 % (ref 11.5–15.5)
WBC: 5.8 10*3/uL (ref 4.0–10.5)

## 2018-04-06 LAB — BASIC METABOLIC PANEL
Anion gap: 7 (ref 5–15)
BUN: 44 mg/dL — AB (ref 8–23)
CHLORIDE: 110 mmol/L (ref 98–111)
CO2: 25 mmol/L (ref 22–32)
Calcium: 9.1 mg/dL (ref 8.9–10.3)
Creatinine, Ser: 1.75 mg/dL — ABNORMAL HIGH (ref 0.44–1.00)
GFR calc Af Amer: 29 mL/min — ABNORMAL LOW (ref >60)
GFR calc non Af Amer: 25 mL/min — ABNORMAL LOW (ref >60)
GLUCOSE: 114 mg/dL — AB (ref 70–99)
POTASSIUM: 4.5 mmol/L (ref 3.5–5.1)
SODIUM: 142 mmol/L (ref 135–145)

## 2018-04-06 LAB — GLUCOSE, CAPILLARY
GLUCOSE-CAPILLARY: 164 mg/dL — AB (ref 70–99)
GLUCOSE-CAPILLARY: 150 mg/dL — AB (ref 70–99)
Glucose-Capillary: 113 mg/dL — ABNORMAL HIGH (ref 70–99)
Glucose-Capillary: 133 mg/dL — ABNORMAL HIGH (ref 70–99)

## 2018-04-06 MED ORDER — GLIPIZIDE 5 MG PO TABS: 2.5000 mg | ORAL_TABLET | Freq: Two times a day (BID) | ORAL | 1 refills | Status: AC

## 2018-04-06 MED ORDER — SACUBITRIL-VALSARTAN 49-51 MG PO TABS
0.5000 | ORAL_TABLET | Freq: Two times a day (BID) | ORAL | 0 refills | Status: AC
Start: 2018-04-06 — End: ?

## 2018-04-06 MED ORDER — CARVEDILOL 3.125 MG PO TABS: 3.1250 mg | ORAL_TABLET | Freq: Every day | ORAL | 1 refills | Status: AC

## 2018-04-06 MED ORDER — ISOSORBIDE MONONITRATE ER 30 MG PO TB24: 15.0000 mg | ORAL_TABLET | Freq: Every day | ORAL | 1 refills | Status: AC

## 2018-04-06 NOTE — Clinical Social Work Note (Signed)
Mary Cook is seeking authorization. Patient can discharge once they receive authorization.     Mary Cook, Clydene Pugh, LCSW

## 2018-04-06 NOTE — Progress Notes (Signed)
Rounding completed. Patient sleeping on right side. Husband in room. No distress noted. Call bell within reach, bed in low position. Will continue to monitor.

## 2018-04-06 NOTE — Care Management Important Message (Signed)
Important Message  Patient Details  Name: Mary Cook MRN: [id number] Date of Birth: [birth year]   Medicare Important Message Given:  Yes    Shelda Altes 04/06/2018, 12:25 PM

## 2018-04-06 NOTE — Progress Notes (Signed)
Phone call made to Social Worker, Nira Conn, to obtain status on patients discharge planning. Nira Conn states patient is not ready for discharge at this moment but she will let me know when everything has been approved. Nira Conn states that she is talking with the Guardian in regards to discharge today. Will continue to monitor.

## 2018-04-06 NOTE — Discharge Summary (Addendum)
Physician Discharge Summary  Mary Cook TDS:[id number] DOB: July 10, 1926 DOA: 03/31/2018  PCP: Cassandria Anger, MD  Admit date: 03/31/2018 Discharge date: 04/09/2018 Time spent: 35 minutes  Recommendations for Outpatient Follow-up:  Check BMET to follow-up on renal function in 3-4 days.  Please adjust blood pressure medications as needed. Metformin discontinued due to renal function and started on low-dose glipizide.  Discharge Diagnoses:  Principal Problem:   AKI (acute kidney injury) chronic kidney disease stage II-III (HCC)  Active Problems:   Diabetes mellitus type II, controlled ([place])   Hyperlipidemia   Essential hypertension   GERD (gastroesophageal reflux disease)   OSA on CPAP   Alzheimer's disease   Chronic systolic CHF (congestive heart failure) (HCC)   Hyperkalemia   Anemia   Sinus bradycardia   Discharge Condition: Fair.  Discharge to ALF.  Diet recommendation: heart healthy diet and modified carbohydrates diet.  Code status: DO NOT RESUSCITATE  Filed Weights   03/31/18 2006 04/01/18 0821  Weight: 54.4 kg 53.8 kg    Brief History of present illness:  85 with PMH significant for chronic combined systolic and diastolic HF, HTN, type 2 diabetes with nephropathy, CKD stage 3, HLD and dementia; who came to ED complaining of generalized weakness and worsening mentation. Patient found to be dehydrated with acute on chronic renal failure and bradycardia.   Hospital Course:   Principal problem AKI (acute kidney injury) on CKD stage 3 (HCC) Dehydration with hypotension and prerenal azotemia.  Improved with IV fluids.  All blood pressure medications were held including Entresto. Renal function now at baseline.  Resumed Coreg and Imdur at low-dose.  Also resumed Entresto upon discharge.  Amlodipine discontinued. Encouraged on adequate hydration.  Monitor renal function in the next 3-4 days.     Active problems Diabetes mellitus with nephropathy ([place]):   Metformin discontinued.  A1c of 5.9.  Started on low-dose glipizide twice daily.  Monitor CBG as outpatient.  Essential hypertension Blood pressure now improving.  Resumed low-dose Coreg and Imdur.  Resume Entresto upon discharge.   OSA on CPAP Patient nonadherent.      Hyperkalemia/hypokalemia .  Resolved  Anemia of chronic kidney disease  Stable.  Follow as outpatient.    Sinus bradycardia Amlodipine discontinued.  Now better.  Resumed low-dose Coreg.  Generalized weakness. PT initially recommended SNF.  Daughter and patient had an open case.  Not accepted by SNF as her spouse to the social services that he will not going to liquidate the assets.  Was accepted by [facility].  Alzheimer's dementia. Supportive care.  Chronic combined systolic and diastolic CHF [facility]) Patient euvolemic on presentation.  Last EF of 20-25%.  Resume low-dose Imdur, beta-blocker and Entresto upon discharge.  Continue digoxin.  Monitor I/O and weight as outpatient.  UA with culture growing 60,000 E. coli colonies. Asymptomatic.  No antibiotics needed.  Generalized weakness  Procedures:  See below for x-ray reports   Consultations:  None   Family communication: Husband at bedside.   Patient stable to be discharged to ALF.  Physical exam Vitals: Heart rate 61/minute Temperature 98 F Blood pressure 146/61 mmHg, O2 sat 99% on room air O2 sat 99% on room air  Discharge Instructions   Allergies as of 04/06/2018      Reactions   Clopidogrel Bisulfate Nausea And Vomiting   Patient is not aware of this allergy   Lipitor [atorvastatin] Other (See Comments)   Weak legs   Namenda [memantine Hcl]    N/v/d  Lisinopril Cough   Patient is not aware of this allergy      Medication List    STOP taking these medications   amLODipine 5 MG tablet Commonly known as:  NORVASC   metFORMIN 500 MG tablet Commonly known as:  GLUCOPHAGE   potassium chloride SA 20 MEQ  tablet Commonly known as:  K-DUR,KLOR-CON     TAKE these medications   ACCU-CHEK AVIVA PLUS w/Device Kit Use to check blood sugars twice a day Dx. E11.9   aspirin 81 MG EC tablet Take 81 mg by mouth daily.   carvedilol 3.125 MG tablet Commonly known as:  COREG Take 1 tablet (3.125 mg total) by mouth daily. What changed:    medication strength  how much to take  when to take this  additional instructions   digoxin 0.125 MG tablet Commonly known as:  LANOXIN Take a half tablet by mouth daily. Patient needs office visit before refills will be given What changed:    how much to take  how to take this  when to take this  additional instructions   glipiZIDE 5 MG tablet Commonly known as:  GLUCOTROL Take 0.5 tablets (2.5 mg total) by mouth 2 (two) times daily before a meal.   isosorbide mononitrate 30 MG 24 hr tablet Commonly known as:  IMDUR Take 0.5 tablets (15 mg total) by mouth daily. What changed:    medication strength  how much to take  additional instructions   sacubitril-valsartan 49-51 MG Commonly known as:  ENTRESTO Take 0.5 tablets by mouth 2 (two) times daily. What changed:  how much to take      Allergies  Allergen Reactions  . Clopidogrel Bisulfate Nausea And Vomiting    Patient is not aware of this allergy  . Lipitor [Atorvastatin] Other (See Comments)    Weak legs  . Namenda [Memantine Hcl]     N/v/d  . Lisinopril Cough    Patient is not aware of this allergy    The results of significant diagnostics from this hospitalization (including imaging, microbiology, ancillary and laboratory) are listed below for reference.    Significant Diagnostic Studies: Dg Chest 2 View  Result Date: 03/31/2018 CLINICAL DATA:  Weakness. EXAM: CHEST - 2 VIEW COMPARISON:  01/22/2017 FINDINGS: Heart size is normal. Prominent mitral annular calcifications. Lungs are clear without pulmonary edema. Atherosclerotic calcifications at the aortic arch. No pleural  effusions. Degenerative changes in the thoracic spine. IMPRESSION: No active cardiopulmonary disease. Electronically Signed   By: Markus Daft M.D.   On: 03/31/2018 21:01   Ct Head Wo Contrast  Result Date: 03/31/2018 CLINICAL DATA:  84 year old with altered mental status.  Dementia. EXAM: CT HEAD WITHOUT CONTRAST TECHNIQUE: Contiguous axial images were obtained from the base of the skull through the vertex without intravenous contrast. COMPARISON:  01/24/2017 FINDINGS: Brain: Stable atrophy. Evidence for old infarct in the inferior left cerebellum. Encephalomalacia related to an old infarct in the right parietal lobe. Again noted is evidence for an old infarct involving the right lateral lenticulostriate distribution. Extensive low density throughout the white matter is suggestive for chronic small vessel ischemic disease. Stable ex vacuo dilatation of the right lateral ventricle. No evidence for acute hemorrhage, mass lesion, midline shift, hydrocephalus or new large infarct. Vascular: No hyperdense vessel or unexpected calcification. Skull: Normal. Negative for fracture or focal lesion. Sinuses/Orbits: No acute finding. Other: None. IMPRESSION: No acute intracranial abnormality. Stable atrophy with extensive old ischemic disease. Electronically Signed   By: Markus Daft  M.D.   On: 03/31/2018 21:08    Microbiology: Recent Results (from the past 240 hour(s))  Urine Culture     Status: Abnormal   Collection Time: 03/31/18  8:07 PM  Result Value Ref Range Status   Specimen Description   Final    URINE, RANDOM Performed at [facility], [address], [place], [place] [address]    Special Requests   Final    NONE Performed at [facility], [address] [place], [place] [address]    Culture 60,000 COLONIES/mL ESCHERICHIA COLI (A)  Final   Report Status 04/04/2018 FINAL  Final   Organism ID, Bacteria ESCHERICHIA COLI (A)  Final      Susceptibility   Escherichia coli - MIC*    AMPICILLIN <=2  SENSITIVE Sensitive     CEFAZOLIN <=4 SENSITIVE Sensitive     CEFTRIAXONE <=1 SENSITIVE Sensitive     CIPROFLOXACIN <=0.25 SENSITIVE Sensitive     GENTAMICIN <=1 SENSITIVE Sensitive     IMIPENEM <=0.25 SENSITIVE Sensitive     NITROFURANTOIN <=16 SENSITIVE Sensitive     TRIMETH/SULFA <=20 SENSITIVE Sensitive     AMPICILLIN/SULBACTAM <=2 SENSITIVE Sensitive     PIP/TAZO <=4 SENSITIVE Sensitive     Extended ESBL NEGATIVE Sensitive     * 60,000 COLONIES/mL ESCHERICHIA COLI     Labs: Basic Metabolic Panel: Recent Labs  Lab 03/31/18 2028 03/31/18 2041 04/01/18 0453 04/02/18 0502 04/03/18 0452 04/06/18 0406  NA 138 137 139 138 140 142  K 5.2* 4.9 4.6 4.4 4.1 4.5  CL 101 100 104 106 108 110  CO2 27  --  '23 26 27 25  '$ GLUCOSE 196* 191* 148* 110* 119* 114*  BUN 87* 86* 86* 69* 53* 44*  CREATININE 3.88* 3.90* 3.59* 2.62* 2.07* 1.75*  CALCIUM 10.0  --  9.5 9.2 9.1 9.1   Liver Function Tests: Recent Labs  Lab 03/31/18 2028 04/01/18 0453  AST 18 15  ALT 15 13  ALKPHOS 57 54  BILITOT 0.9 0.7  PROT 7.3 6.6  ALBUMIN 4.0 3.5   CBC: Recent Labs  Lab 03/31/18 2028 03/31/18 2041 04/01/18 0453 04/06/18 0406  WBC 7.1  --  7.4 5.8  NEUTROABS 5.0  --   --   --   HGB 11.9* 11.6* 11.1* 10.4*  HCT 35.1* 34.0* 33.5* 31.4*  MCV 94.4  --  94.6 96.3  PLT 181  --  178 160   CBG: Recent Labs  Lab 04/05/18 0733 04/05/18 1125 04/05/18 1541 04/05/18 2223 04/06/18 0725  GLUCAP 129* 163* 82 121* 113*    Signed: Gearald Stonebraker MD.  [facility] 04/06/2018, 8:16 AM

## 2018-04-06 NOTE — Progress Notes (Signed)
Physical Therapy Treatment Patient Details Name: Mary Cook MRN: [id number] DOB: 1925/12/25 Today's Date: 04/06/2018    History of Present Illness Mary Cook is a 84 y.o. female with medical history significant of CAD, cerebral aneurysm, chronic combined systolic and diastolic CHF, diverticulosis of colon, essential hypertension, history of GI bleed, hyperlipidemia, osteopenia, sleep apnea on CPAP, T12 compression fracture, history of CVA, type 2 diabetes who was brought to the emergency department due to [place].  Per husband, she has not have fever, chills, dyspnea, nausea, vomiting, diarrhea, or dysuria.  However she has urinary incontinence.  She has not been eating or drinking much recently.  Her sister called the staff and stated that her place is not fit or cleaning of for them to live there.  The patient's primary caregiver is her husband, who according to the patient's sister has dementia as well, but not as advanced.  No further history available    PT Comments    Patient demonstrates increased endurance/distance for gait training with slow labored unsteady cadence, requires assistance to turn RW when patient making turn to go back to bedside, limited for gait secondary to c/o fatigue and tolerated sitting up in chair with family present at bedside after therapy.    Follow Up Recommendations  SNF;Supervision/Assistance - 24 hour     Equipment Recommendations  None recommended by PT    Recommendations for Other Services       Precautions / Restrictions Precautions Precautions: Fall Restrictions Weight Bearing Restrictions: No    Mobility  Bed Mobility Overal bed mobility: Needs Assistance Bed Mobility: Supine to Sit     Supine to sit: Min assist;Mod assist     General bed mobility comments: slow labored movement, requires frequent verbal/tactile cue for proper use of BUE for sitting up/scooting forward  Transfers Overall transfer level: Needs  assistance Equipment used: Rolling walker (2 wheeled) Transfers: Sit to/from [facility] Sit to Stand: Min assist Stand pivot transfers: Min assist;Mod assist       General transfer comment: slow labored movement  Ambulation/Gait Ambulation/Gait assistance: Min assist;Mod assist Gait Distance (Feet): 22 Feet Assistive device: Rolling walker (2 wheeled) Gait Pattern/deviations: Decreased step length - right;Decreased step length - left;Decreased stride length Gait velocity: slow   General Gait Details: demonstrates slow labored unsteady cadence, diffiuclty making turns requiring assistance to move RW, limited secondary to c/o fatigue   Stairs             Wheelchair Mobility    Modified Rankin (Stroke Patients Only)       Balance Overall balance assessment: Needs assistance Sitting-balance support: Feet supported;No upper extremity supported Sitting balance-Leahy Scale: Good     Standing balance support: During functional activity;Bilateral upper extremity supported Standing balance-Leahy Scale: Fair                              Cognition Arousal/Alertness: Awake/alert Behavior During Therapy: WFL for tasks assessed/performed Overall Cognitive Status: Within Functional Limits for tasks assessed                                        Exercises General Exercises - Lower Extremity Long Arc Quad: Seated;AROM;Strengthening;Both;5 reps Hip Flexion/Marching: Seated;AROM;Strengthening;5 reps;Both Toe Raises: Seated;AROM;Strengthening;Both;10 reps Heel Raises: Seated;AROM;Strengthening;Both;10 reps    General Comments        Pertinent Vitals/Pain Pain  Assessment: No/denies pain    Home Living                      Prior Function            PT Goals (current goals can now be found in the care plan section) Acute Rehab PT Goals Patient Stated Goal: return home PT Goal Formulation: With  patient/family Time For Goal Achievement: 04/13/18 Potential to Achieve Goals: Good Progress towards PT goals: Progressing toward goals    Frequency    Min 3X/week      PT Plan Current plan remains appropriate    Co-evaluation              AM-PAC PT "6 Clicks" Daily Activity  Outcome Measure  Difficulty turning over in bed (including adjusting bedclothes, sheets and blankets)?: A Little Difficulty moving from lying on back to sitting on the side of the bed? : A Lot Difficulty sitting down on and standing up from a chair with arms (e.g., wheelchair, bedside commode, etc,.)?: A Lot Help needed moving to and from a bed to chair (including a wheelchair)?: A Lot Help needed walking in hospital room?: A Lot Help needed climbing 3-5 steps with a railing? : Total 6 Click Score: 12    End of Session   Activity Tolerance: Patient tolerated treatment well;Patient limited by fatigue Patient left: in chair;with call bell/phone within reach;with chair alarm set Nurse Communication: Mobility status PT Visit Diagnosis: Unsteadiness on feet (R26.81);Other abnormalities of gait and mobility (R26.89);Muscle weakness (generalized) (M62.81)     Time: 1103-1130 PT Time Calculation (min) (ACUTE ONLY): 27 min  Charges:  $Therapeutic Exercise: 8-22 mins $Therapeutic Activity: 8-22 mins                     2:01 PM, 04/06/18 Lonell Grandchild, MPT Physical Therapist with [facility] 336 [phone] office [phone] mobile phone

## 2018-04-07 LAB — GLUCOSE, CAPILLARY
GLUCOSE-CAPILLARY: 111 mg/dL — AB (ref 70–99)
GLUCOSE-CAPILLARY: 110 mg/dL — AB (ref 70–99)
GLUCOSE-CAPILLARY: 180 mg/dL — AB (ref 70–99)
Glucose-Capillary: 126 mg/dL — ABNORMAL HIGH (ref 70–99)

## 2018-04-07 NOTE — Progress Notes (Signed)
PROGRESS NOTE    Mary Cook  SEG:[id number] DOB: [birth year] DOA: 03/31/2018 PCP: Cassandria Anger, MD     Brief Narrative:  62 with PMH significant for chronic combined systolic and diastolic HF, HTN, type 2 diabetes with nephropathy, CKD stage 3, HLD and dementia; who came to ED complaining of generalized weakness and worsening mentation. Patient found to be dehydrated with acute on chronic renal failure and bradycardia.    Assessment & Plan: 1-AKI (acute kidney injury) on CKD stage 3 (HCC) -appears to be due to dehydration, decrease perfusion with low BP and pre-renal azotemia. -improved/resolved with IVF's. -continue holding entresto for now -has tolerated resumption of low dose coreg and imdur. -follow BMET in am -encourage to maintain adequate hydration.  2-Diabetes mellitus with nephropathy ([place]):  -will continue holding oral hypoglycemic agents and continue SSI for now -follow CBG's and adjust hypoglycemic regimen as needed. -A1C 5.9; most likely discharge on half dose of current hypoglycemic regimen  And modified carb diet.  3-Essential hypertension -BP is stable and well controlled. -will continue low dose coreg and 1/2 dose imdur.  -follow VS  4-GERD (gastroesophageal reflux disease)-continue PPI  5-OSA on CPAP -encourage CPAP QHS; patient has decline use. -no complaints of SOB and no requiring oxygen supplementation.  6-Alzheimer's disease -continue supportive care -no agitation during examination; following commands and answering question appropriately.  7-Chronic systolic CHF (congestive heart failure) (HCC) -stable and compensated  -follow daily weights, strict I's and O's and low sodium diet. -will continue low dose nitrates and B-blocker -most likely resume low dose entresto at discharge.    8-Hyperkalemia/hypokalemia -now resolved -will follow electrolytes trend in am -replete as needed  9-Anemia of chronic kidney disease  -no signs of  acute bleeding. -no transfusion needed at this time. -follow Hgb trend in am    10-Sinus bradydia -will discontinue calcium channel blocker -Heart rate has tolerated well with the use of low-dose Coreg.  11-social issues and weakness -APS was contacted and patient had an open case; she will need to be placed for further/better care. -will follow CM and SW assistance at discharge  DVT prophylaxis: heparin  Code Status: DNR Family Communication: husband and caregiver at bedside. Disposition Plan: Patient is medically stable and at this moment awaiting acceptance/insurance approval to be discharged to facility.  Consultants:   None   Procedures:   See below for x-ray reports   Antimicrobials:  Anti-infectives (From admission, onward)   None      Subjective: Afebrile, no chest pain, no shortness of breath, no nausea, no vomiting.  Per family at bedside patient is eating and drinking well.  No overnight events.  Patient waiting for insurance authorization in order to be discharged.  Objective: Vitals:   04/06/18 0559 04/06/18 1348 04/06/18 2126 04/07/18 0630  BP: (!) 98/50 (!) 98/45 (!) 109/41 (!) 125/57  Pulse: (!) 59 60 (!) 59 (!) 58  Resp: 15 16 16 17   Temp: 98 F (36.7 C) 98.2 F (36.8 C)  98.7 F (37.1 C)  TempSrc: Oral Oral    SpO2: 98% 100% 99% 99%  Weight:      Height:        Intake/Output Summary (Last 24 hours) at 04/07/2018 1532 Last data filed at 04/07/2018 1400 Gross per 24 hour  Intake 720 ml  Output -  Net 720 ml   Filed Weights   03/31/18 2006 04/01/18 0821  Weight: 54.4 kg 53.8 kg    Examination: General exam: Alert, awake, oriented  x 2; in no acute distress denying any chest pain or shortness of breath.  Patient is tolerating diet without problem maintaining adequate hydration.  No overnight events. Respiratory system: Clear to auscultation. Respiratory effort normal. Cardiovascular system:RRR. No murmurs, rubs, gallops. Gastrointestinal  system: Abdomen is nondistended, soft and nontender. No organomegaly or masses felt. Normal bowel sounds heard. Central nervous system: Alert and oriented. No focal neurological deficits. Extremities: No C/C/E, +pedal pulses Skin: No rashes, lesions or ulcers Psychiatry: Judgement and insight abnormal in the setting of dementia and chronic cognitive impairment. Mood & affect appropriate.    Data Reviewed: I have personally reviewed following labs and imaging studies  CBC: Recent Labs  Lab 03/31/18 2028 03/31/18 2041 04/01/18 0453 04/06/18 0406  WBC 7.1  --  7.4 5.8  NEUTROABS 5.0  --   --   --   HGB 11.9* 11.6* 11.1* 10.4*  HCT 35.1* 34.0* 33.5* 31.4*  MCV 94.4  --  94.6 96.3  PLT 181  --  178 476   Basic Metabolic Panel: Recent Labs  Lab 03/31/18 2028 03/31/18 2041 04/01/18 0453 04/02/18 0502 04/03/18 0452 04/06/18 0406  NA 138 137 139 138 140 142  K 5.2* 4.9 4.6 4.4 4.1 4.5  CL 101 100 104 106 108 110  CO2 27  --  23 26 27 25   GLUCOSE 196* 191* 148* 110* 119* 114*  BUN 87* 86* 86* 69* 53* 44*  CREATININE 3.88* 3.90* 3.59* 2.62* 2.07* 1.75*  CALCIUM 10.0  --  9.5 9.2 9.1 9.1   GFR: Estimated Creatinine Clearance: 18 mL/min (A) (by C-G formula based on SCr of 1.75 mg/dL (H)).   Liver Function Tests: Recent Labs  Lab 03/31/18 2028 04/01/18 0453  AST 18 15  ALT 15 13  ALKPHOS 57 54  BILITOT 0.9 0.7  PROT 7.3 6.6  ALBUMIN 4.0 3.5   CBG: Recent Labs  Lab 04/06/18 1105 04/06/18 1609 04/06/18 2128 04/07/18 0756 04/07/18 1154  GLUCAP 164* 150* 133* 111* 126*   Urine analysis:    Component Value Date/Time   COLORURINE AMBER (A) 03/31/2018 2007   APPEARANCEUR CLOUDY (A) 03/31/2018 2007   LABSPEC 1.013 03/31/2018 2007   PHURINE 5.0 03/31/2018 2007   GLUCOSEU NEGATIVE 03/31/2018 2007   GLUCOSEU NEGATIVE 10/12/2014 1053   HGBUR NEGATIVE 03/31/2018 2007   BILIRUBINUR NEGATIVE 03/31/2018 2007   KETONESUR NEGATIVE 03/31/2018 2007   PROTEINUR NEGATIVE  03/31/2018 2007   UROBILINOGEN 1.0 04/28/2015 1658   NITRITE NEGATIVE 03/31/2018 2007   LEUKOCYTESUR TRACE (A) 03/31/2018 2007    Radiology Studies: No results found.   Scheduled Meds: . aspirin EC  81 mg Oral Daily  . carvedilol  3.125 mg Oral BID WC  . heparin  5,000 Units Subcutaneous Q8H  . insulin aspart  0-9 Units Subcutaneous TID WC  . isosorbide mononitrate  15 mg Oral Daily  . potassium chloride SA  20 mEq Oral Daily   Continuous Infusions:    LOS: 7 days    Time spent: 25 minutes   Barton Dubois, MD [facility] Pager [phone]  If 7PM-7AM, please contact night-coverage [email or web address] Password [facility] 04/07/2018, 3:32 PM

## 2018-04-07 NOTE — Clinical Social Work Note (Signed)
Facility is awaiting authorization from [facility] for SNF placement.    LCSW will continue to follow up with facility regarding authorization.     Haruka Kowaleski, Clydene Pugh, LCSW

## 2018-04-07 NOTE — Care Management Note (Signed)
Case Management Note  Patient Details  Name: VERNECIA UMBLE MRN: [id number] Date of Birth: [birth year]   If discussed at Long Length of Stay Meetings, dates discussed:  04/07/2018  Additional Comments:   Carlinda Ohlson, Chauncey Reading, RN 04/07/2018, 12:07 PM

## 2018-04-08 DIAGNOSIS — N170 Acute kidney failure with tubular necrosis: Secondary | ICD-10-CM

## 2018-04-08 LAB — GLUCOSE, CAPILLARY
GLUCOSE-CAPILLARY: 90 mg/dL (ref 70–99)
GLUCOSE-CAPILLARY: 132 mg/dL — AB (ref 70–99)
GLUCOSE-CAPILLARY: 173 mg/dL — AB (ref 70–99)
Glucose-Capillary: 182 mg/dL — ABNORMAL HIGH (ref 70–99)

## 2018-04-08 NOTE — Clinical Social Work Note (Signed)
[facility] will not be able to accept patient as a resident due to her spouse telling DSS he will not liquidate assets.   Lynelle Smoke and Butch Penny at [facility] assessed patient for possible admission to [facility].     Anthonee Gelin, Clydene Pugh, LCSW

## 2018-04-08 NOTE — Progress Notes (Signed)
PROGRESS NOTE                                                                                                                                                                                                             Patient Demographics:    Mary Cook, is a 84 y.o. female, DOB - 24-Feb-1926, QPR:[id number]  Admit date - 03/31/2018   Admitting Physician Reubin Milan, MD  Outpatient Primary MD for the patient is Plotnikov, Evie Lacks, MD  LOS - 8  Outpatient Specialists: none  Chief Complaint  Patient presents with  . Altered Mental Status       Brief Narrative   84 year old female with chronic combined systolic and diastolic CHF, stage III chronic kidney disease, type 2 diabetes mellitus with nephropathy, dementia and hyperlipidemia presented with generalized weakness mental status. Patient found to be in acute on chronic renal failure and dehydration.   Subjective:   Patient denies any symptoms. No overnight events.   Assessment  & Plan :    Principal Problem: Acute kidney injury on chronic kidney disease stage III (HCC) Prerenal secondary to dehydration. Improved with IV fluids. And distal on hold. Resumed low-dose Coreg and Imdur. Follow labs in a.m.  Active Problems: Diabetes mellitus with nephropathy Oral hypoglycemics on hold due to acute kidney injury. A1c of 5.9. Possibly discharge her off home meds and have them be resumed at a low dose if renal function stable as outpt.  OSA on CPAP Patient declining use.    Chronic systolic CHF Euvolemic. Continue low-dose beta blocker and Imdur. entresto held due to dehydration and acute kidney injury.  Alzheimer's dementia   Hypo/ hyperkalemia Resolved.      Code Status : DO NOT RESUSCITATE  Family Communication  : husband at bedside  Disposition Plan  : initially plan to SNF but likely Go to ALF. Possibly tomorrow.  Barriers For  Discharge : improving symptoms  Consults  :  none Procedures  : none  DVT Prophylaxis  :  Subcutaneous heparin  Lab Results  Component Value Date   PLT 160 04/06/2018    Antibiotics  :    Anti-infectives (From admission, onward)   None        Objective:   Vitals:   04/08/18 0516 04/08/18 0621 04/08/18 0826 04/08/18 1414  BP: (!) 118/35 (!) 107/43  131/75 (!) 108/58  Pulse: (!) 56 (!) 54 (!) 55 (!) 59  Resp: 16   18  Temp: 98.3 F (36.8 C)   97.6 F (36.4 C)  TempSrc: Oral   Oral  SpO2: 98%   100%  Weight:      Height:        Wt Readings from Last 3 Encounters:  04/08/18 54 kg  01/16/18 55.7 kg  06/05/17 63.5 kg     Intake/Output Summary (Last 24 hours) at 04/08/2018 1730 Last data filed at 04/08/2018 1300 Gross per 24 hour  Intake 720 ml  Output -  Net 720 ml     Physical Exam  Gen: not in distress HEENT:  moist mucosa, supple neck Chest: clear b/l, no added sounds CVS: N S1&S2, no murmurs,  GI: soft, NT, ND,  Musculoskeletal: warm, no edema    Data Review:    CBC Recent Labs  Lab 04/06/18 0406  WBC 5.8  HGB 10.4*  HCT 31.4*  PLT 160  MCV 96.3  MCH 31.9  MCHC 33.1  RDW 12.8    Chemistries  Recent Labs  Lab 04/02/18 0502 04/03/18 0452 04/06/18 0406  NA 138 140 142  K 4.4 4.1 4.5  CL 106 108 110  CO2 26 27 25   GLUCOSE 110* 119* 114*  BUN 69* 53* 44*  CREATININE 2.62* 2.07* 1.75*  CALCIUM 9.2 9.1 9.1   ------------------------------------------------------------------------------------------------------------------ No results for input(s): CHOL, HDL, LDLCALC, TRIG, CHOLHDL, LDLDIRECT in the last 72 hours.  Lab Results  Component Value Date   HGBA1C 5.9 (H) 04/01/2018   ------------------------------------------------------------------------------------------------------------------ No results for input(s): TSH, T4TOTAL, T3FREE, THYROIDAB in the last 72 hours.  Invalid input(s):  FREET3 ------------------------------------------------------------------------------------------------------------------ No results for input(s): VITAMINB12, FOLATE, FERRITIN, TIBC, IRON, RETICCTPCT in the last 72 hours.  Coagulation profile No results for input(s): INR, PROTIME in the last 168 hours.  No results for input(s): DDIMER in the last 72 hours.  Cardiac Enzymes No results for input(s): CKMB, TROPONINI, MYOGLOBIN in the last 168 hours.  Invalid input(s): CK ------------------------------------------------------------------------------------------------------------------    Component Value Date/Time   BNP 174.8 (H) 08/13/2016 1131    Inpatient Medications  Scheduled Meds: . aspirin EC  81 mg Oral Daily  . carvedilol  3.125 mg Oral BID WC  . heparin  5,000 Units Subcutaneous Q8H  . insulin aspart  0-9 Units Subcutaneous TID WC  . isosorbide mononitrate  15 mg Oral Daily  . potassium chloride SA  20 mEq Oral Daily   Continuous Infusions: PRN Meds:.acetaminophen **OR** acetaminophen, ondansetron **OR** ondansetron (ZOFRAN) IV  Micro Results Recent Results (from the past 240 hour(s))  Urine Culture     Status: Abnormal   Collection Time: 03/31/18  8:07 PM  Result Value Ref Range Status   Specimen Description   Final    URINE, RANDOM Performed at [facility], [address], [place], [place] [address]    Special Requests   Final    NONE Performed at [facility], [address], [place], [place] [address]    Culture 60,000 COLONIES/mL ESCHERICHIA COLI (A)  Final   Report Status 04/04/2018 FINAL  Final   Organism ID, Bacteria ESCHERICHIA COLI (A)  Final      Susceptibility   Escherichia coli - MIC*    AMPICILLIN <=2 SENSITIVE Sensitive     CEFAZOLIN <=4 SENSITIVE Sensitive     CEFTRIAXONE <=1 SENSITIVE Sensitive     CIPROFLOXACIN <=0.25 SENSITIVE Sensitive     GENTAMICIN <=1 SENSITIVE  Sensitive     IMIPENEM <=0.25 SENSITIVE Sensitive     NITROFURANTOIN <=16  SENSITIVE Sensitive     TRIMETH/SULFA <=20 SENSITIVE Sensitive     AMPICILLIN/SULBACTAM <=2 SENSITIVE Sensitive     PIP/TAZO <=4 SENSITIVE Sensitive     Extended ESBL NEGATIVE Sensitive     * 60,000 COLONIES/mL ESCHERICHIA COLI    Radiology Reports Dg Chest 2 View  Result Date: 03/31/2018 CLINICAL DATA:  Weakness. EXAM: CHEST - 2 VIEW COMPARISON:  01/22/2017 FINDINGS: Heart size is normal. Prominent mitral annular calcifications. Lungs are clear without pulmonary edema. Atherosclerotic calcifications at the aortic arch. No pleural effusions. Degenerative changes in the thoracic spine. IMPRESSION: No active cardiopulmonary disease. Electronically Signed   By: Markus Daft M.D.   On: 03/31/2018 21:01   Ct Head Wo Contrast  Result Date: 03/31/2018 CLINICAL DATA:  84 year old with altered mental status.  Dementia. EXAM: CT HEAD WITHOUT CONTRAST TECHNIQUE: Contiguous axial images were obtained from the base of the skull through the vertex without intravenous contrast. COMPARISON:  01/24/2017 FINDINGS: Brain: Stable atrophy. Evidence for old infarct in the inferior left cerebellum. Encephalomalacia related to an old infarct in the right parietal lobe. Again noted is evidence for an old infarct involving the right lateral lenticulostriate distribution. Extensive low density throughout the white matter is suggestive for chronic small vessel ischemic disease. Stable ex vacuo dilatation of the right lateral ventricle. No evidence for acute hemorrhage, mass lesion, midline shift, hydrocephalus or new large infarct. Vascular: No hyperdense vessel or unexpected calcification. Skull: Normal. Negative for fracture or focal lesion. Sinuses/Orbits: No acute finding. Other: None. IMPRESSION: No acute intracranial abnormality. Stable atrophy with extensive old ischemic disease. Electronically Signed   By: Markus Daft M.D.   On: 03/31/2018 21:08    Time Spent in minutes  25   Damauri Minion M.D on 04/08/2018 at 5:30  PM  Between 7am to 7pm - Pager - [phone]  After 7pm go to [email or web address] - password [facility]  [facility] -  Office  [phone]

## 2018-04-08 NOTE — Care Management Important Message (Signed)
Important Message  Patient Details  Name: Mary Cook MRN: [id number] Date of Birth: Mar 18, 1926   Medicare Important Message Given:  Yes    Shelda Altes 04/08/2018, 11:24 AM

## 2018-04-08 NOTE — NC FL2 (Signed)
[place] MEDICAID FL2 LEVEL OF CARE SCREENING TOOL     IDENTIFICATION  Patient Name: Mary Cook Birthdate: [birth year] Sex: female Admission Date (Current Location): 03/31/2018  [facility] [place] Number:  [employer] and Address:  [place] [address] [place]      Provider Number: [id number]  Attending Physician Name and Address:  Louellen Molder, MD  Relative Name and Phone Number:       Current Level of Care: Hospital Recommended Level of Care: [place] Prior Approval Number:    Date Approved/Denied:   PASRR Number: [id number])  Discharge Plan: Other (Comment)(ALF)    Current Diagnoses: Patient Active Problem List   Diagnosis Date Noted  . Sinus bradycardia 04/01/2018  . AKI (acute kidney injury) ([place]) 03/31/2018  . Hyperkalemia 03/31/2018  . Anemia 03/31/2018  . Traumatic subarachnoid hemorrhage with loss of consciousness of 30 minutes or less ([place])   . Anemia of chronic disease 09/06/2016  . Gastroenteritis 07/22/2016  . Hip pain, chronic 06/03/2016  . Dyspnea 11/15/2015  . Acute respiratory failure with hypoxia ([place]) 11/15/2015  . CHF exacerbation ([place]) 11/15/2015  . Pain in the chest   . Chronic systolic CHF (congestive heart failure) ([place])   . Ischemic cardiomyopathy   . T12 compression fracture ([place])   . Intertrigo 02/28/2015  . Actinic keratoses 02/28/2015  . Noncompliance with diet and medication regimen 02/28/2015  . LBP (low back pain) 02/13/2015  . Acute on chronic renal insufficiency 02/04/2015  . Cardiomyopathy, ischemic-EF 30-35% by echo 01/31/15 02/04/2015  . Chronic kidney disease stage III 02/03/2015  . Acute on chronic combined systolic and diastolic CHF, NYHA class 3 ([place]) 01/30/2015  . Cystitis 10/28/2014  . Insomnia 10/28/2014  . Alzheimer's disease 10/28/2014  . Falls frequently 10/12/2014  . Leg weakness, bilateral 10/12/2014  . OSA on CPAP 09/05/2014   . Rapid palpitations 06/07/2014  . Acute chest pain 06/06/2014  . Chest pain 06/06/2014  . Thoracic back pain after fall  04/13/2013  . Stress at home 01/26/2013  . GERD (gastroesophageal reflux disease) 01/25/2013  . Abnormal EKG, marked new ant TWI this adm with negative Troponins,08/18/11 08/20/2011  . CAD S/P CFX PCI Sept 2011 08/18/2011  . Sleep apnea, cpap had been stopped secondary to "sinus issues" 08/18/2011  . [name] deficiency 03/20/2010  . Diabetes mellitus ([place]) 01/07/2008  . Hyperlipidemia 01/07/2008  . Essential hypertension 04/25/2007  . DIVERTICULOSIS, COLON 04/25/2007  . OSTEOARTHRITIS 04/25/2007  . Disorder of bone and cartilage 04/25/2007  . DIVERTICULITIS, HX OF 04/25/2007    Orientation RESPIRATION BLADDER Height & Weight     Self  Normal Incontinent Weight: 119 lb 0.8 oz (54 kg) Height:  5\' 3"  (160 cm)  BEHAVIORAL SYMPTOMS/MOOD NEUROLOGICAL BOWEL NUTRITION STATUS      Incontinent Diet(heart healthy/carb modified. (which facilty states equates to their regular diet/no concentrated sweets) )  AMBULATORY STATUS COMMUNICATION OF NEEDS Skin   Limited Assist Verbally Normal                       Personal Care Assistance Level of Assistance  Bathing, Feeding, Dressing Bathing Assistance: Limited assistance Feeding assistance: Independent Dressing Assistance: Limited assistance     Functional Limitations Info             SPECIAL CARE FACTORS FREQUENCY  PT (By licensed PT)     PT Frequency: 3x/week  Contractures Contractures Info: Not present    Additional Factors Info  Code Status, Allergies Code Status Info: DNR Allergies Info: Clopidogrel Bisulfate, Lipitor, Namenda, LIsinopril           Current Medications (04/08/2018):  This is the current hospital active medication list Current Facility-Administered Medications  Medication Dose Route Frequency Provider Last Rate Last Dose  . acetaminophen (TYLENOL) tablet 650 mg   650 mg Oral Q6H PRN Barton Dubois, MD       Or  . acetaminophen (TYLENOL) suppository 650 mg  650 mg Rectal Q6H PRN Barton Dubois, MD      . aspirin EC tablet 81 mg  81 mg Oral Daily Barton Dubois, MD   81 mg at 04/08/18 0827  . carvedilol (COREG) tablet 3.125 mg  3.125 mg Oral BID WC Barton Dubois, MD   3.125 mg at 04/08/18 0827  . heparin injection 5,000 Units  5,000 Units Subcutaneous Q8H Barton Dubois, MD   5,000 Units at 04/08/18 0615  . insulin aspart (novoLOG) injection 0-9 Units  0-9 Units Subcutaneous TID WC Barton Dubois, MD   1 Units at 04/08/18 1224  . isosorbide mononitrate (IMDUR) 24 hr tablet 15 mg  15 mg Oral Daily Barton Dubois, MD   15 mg at 04/08/18 0827  . ondansetron (ZOFRAN) tablet 4 mg  4 mg Oral Q6H PRN Barton Dubois, MD       Or  . ondansetron [facility]) injection 4 mg  4 mg Intravenous Q6H PRN Barton Dubois, MD      . potassium chloride SA (K-DUR,KLOR-CON) CR tablet 20 mEq  20 mEq Oral Daily Barton Dubois, MD   20 mEq at 04/08/18 0340     Discharge Medications: Please see discharge summary for a list of discharge medications.  Relevant Imaging Results:  Relevant Lab Results:   Additional Information SSN [id number] Clydene Pugh, LCSW

## 2018-04-09 LAB — GLUCOSE, CAPILLARY
GLUCOSE-CAPILLARY: 120 mg/dL — AB (ref 70–99)
Glucose-Capillary: 88 mg/dL (ref 70–99)

## 2018-04-09 LAB — BASIC METABOLIC PANEL
ANION GAP: 6 (ref 5–15)
BUN: 43 mg/dL — ABNORMAL HIGH (ref 8–23)
CALCIUM: 9.2 mg/dL (ref 8.9–10.3)
CO2: 25 mmol/L (ref 22–32)
CREATININE: 1.85 mg/dL — AB (ref 0.44–1.00)
Chloride: 109 mmol/L (ref 98–111)
GFR, EST AFRICAN AMERICAN: 27 mL/min — AB (ref >60)
GFR, EST NON AFRICAN AMERICAN: 23 mL/min — AB (ref >60)
Glucose, Bld: 109 mg/dL — ABNORMAL HIGH (ref 70–99)
Potassium: 5 mmol/L (ref 3.5–5.1)
SODIUM: 140 mmol/L (ref 135–145)

## 2018-04-09 MED ORDER — CARVEDILOL 3.125 MG PO TABS: 3.1250 mg | ORAL_TABLET | Freq: Two times a day (BID) | ORAL | 0 refills | Status: DC

## 2018-04-09 MED ORDER — ISOSORBIDE MONONITRATE ER 30 MG PO TB24: 30.0000 mg | ORAL_TABLET | Freq: Every day | ORAL | 0 refills | Status: DC

## 2018-04-09 NOTE — Progress Notes (Signed)
Physical Therapy Treatment Patient Details Name: Mary Cook MRN: [id number] DOB: October 02, 1925 Today's Date: 04/09/2018    History of Present Illness Mary Cook is a 84 y.o. female with medical history significant of CAD, cerebral aneurysm, chronic combined systolic and diastolic CHF, diverticulosis of colon, essential hypertension, history of GI bleed, hyperlipidemia, osteopenia, sleep apnea on CPAP, T12 compression fracture, history of CVA, type 2 diabetes who was brought to the emergency department due to [place].  Per husband, she has not have fever, chills, dyspnea, nausea, vomiting, diarrhea, or dysuria.  However she has urinary incontinence.  She has not been eating or drinking much recently.  Her sister called the staff and stated that her place is not fit or cleaning of for them to live there.  The patient's primary caregiver is her husband, who according to the patient's sister has dementia as well, but not as advanced.  No further history available    PT Comments    Overall patient demonstrates increased tolerance for out of bed and taking steps with RW, limited mostly due to c/o fatigue, requires frequent verbal/tactile cueing for safety due to mildly impulsive behavior and tolerated sitting up in chair with her spouse present at bedside.  Patient will benefit from continued physical therapy in hospital and recommended venue below to increase strength, balance, endurance for safe ADLs and gait.   Follow Up Recommendations  SNF;Supervision/Assistance - 24 hour     Equipment Recommendations  None recommended by PT    Recommendations for Other Services       Precautions / Restrictions Precautions Precautions: Fall Restrictions Weight Bearing Restrictions: No    Mobility  Bed Mobility Overal bed mobility: Needs Assistance Bed Mobility: Supine to Sit     Supine to sit: Min assist        Transfers Overall transfer level: Needs assistance Equipment used:  Rolling walker (2 wheeled) Transfers: Sit to/from [facility] Sit to Stand: Min assist Stand pivot transfers: Min assist       General transfer comment: slow labored movement  Ambulation/Gait Ambulation/Gait assistance: Min assist Gait Distance (Feet): 35 Feet Assistive device: Rolling walker (2 wheeled) Gait Pattern/deviations: Decreased step length - right;Decreased step length - left;Decreased stride length Gait velocity: slow   General Gait Details: slow labored cadence with decreased step/stride length,, once fatigued moves slower with increased fall risk   Stairs             Wheelchair Mobility    Modified Rankin (Stroke Patients Only)       Balance Overall balance assessment: Needs assistance Sitting-balance support: Feet supported;No upper extremity supported Sitting balance-Leahy Scale: Good     Standing balance support: Bilateral upper extremity supported;During functional activity Standing balance-Leahy Scale: Fair                              Cognition Arousal/Alertness: Awake/alert Behavior During Therapy: WFL for tasks assessed/performed Overall Cognitive Status: Within Functional Limits for tasks assessed                                        Exercises General Exercises - Lower Extremity Long Arc Quad: Seated;AROM;Strengthening;Both;5 reps Hip Flexion/Marching: Seated;AROM;Strengthening;5 reps;Both Toe Raises: Seated;AROM;Strengthening;Both;10 reps Heel Raises: Seated;AROM;Strengthening;Both;10 reps    General Comments        Pertinent Vitals/Pain Pain Assessment: No/denies pain  Home Living                      Prior Function            PT Goals (current goals can now be found in the care plan section) Acute Rehab PT Goals Patient Stated Goal: return home PT Goal Formulation: With patient/family Time For Goal Achievement: 04/13/18 Potential to Achieve Goals:  Good Progress towards PT goals: Progressing toward goals    Frequency    Min 3X/week      PT Plan Current plan remains appropriate    Co-evaluation              AM-PAC PT "6 Clicks" Daily Activity  Outcome Measure  Difficulty turning over in bed (including adjusting bedclothes, sheets and blankets)?: A Little Difficulty moving from lying on back to sitting on the side of the bed? : A Little Difficulty sitting down on and standing up from a chair with arms (e.g., wheelchair, bedside commode, etc,.)?: A Little Help needed moving to and from a bed to chair (including a wheelchair)?: A Little Help needed walking in hospital room?: A Lot Help needed climbing 3-5 steps with a railing? : A Lot 6 Click Score: 16    End of Session   Activity Tolerance: Patient tolerated treatment well;Patient limited by fatigue Patient left: in chair;with call bell/phone within reach;with family/visitor present Nurse Communication: Mobility status PT Visit Diagnosis: Unsteadiness on feet (R26.81);Other abnormalities of gait and mobility (R26.89);Muscle weakness (generalized) (M62.81)     Time: 7159-5396 PT Time Calculation (min) (ACUTE ONLY): 22 min  Charges:  $Therapeutic Activity: 8-22 mins                     2:01 PM, 04/09/18 Lonell Grandchild, MPT Physical Therapist with [facility] [phone] office [phone] mobile phone

## 2018-04-09 NOTE — [place] FL2 (Signed)
[place] MEDICAID FL2 LEVEL OF CARE SCREENING TOOL     IDENTIFICATION  Patient Name: Mary Cook Birthdate: Aug 05, 1926 Sex: female Admission Date (Current Location): 03/31/2018  [facility] and [place] Number:  [employer] and Address:  [place] [address] [place]      Provider Number: [id number]  Attending Physician Name and Address:  Louellen Molder, MD  Relative Name and Phone Number:       Current Level of Care: Hospital Recommended Level of Care: [place] Prior Approval Number:    Date Approved/Denied:   PASRR Number: [id number])  Discharge Plan: Other (Comment)(ALF)    Current Diagnoses: Patient Active Problem List   Diagnosis Date Noted  . Sinus bradycardia 04/01/2018  . AKI (acute kidney injury) ([place]) 03/31/2018  . Hyperkalemia 03/31/2018  . Anemia 03/31/2018  . Traumatic subarachnoid hemorrhage with loss of consciousness of 30 minutes or less ([place])   . Anemia of chronic disease 09/06/2016  . Gastroenteritis 07/22/2016  . Hip pain, chronic 06/03/2016  . Dyspnea 11/15/2015  . Acute respiratory failure with hypoxia ([place]) 11/15/2015  . CHF exacerbation ([place]) 11/15/2015  . Pain in the chest   . Chronic systolic CHF (congestive heart failure) ([place])   . Ischemic cardiomyopathy   . T12 compression fracture ([place])   . Intertrigo 02/28/2015  . Actinic keratoses 02/28/2015  . Noncompliance with diet and medication regimen 02/28/2015  . LBP (low back pain) 02/13/2015  . Acute on chronic renal insufficiency 02/04/2015  . Cardiomyopathy, ischemic-EF 30-35% by echo 01/31/15 02/04/2015  . Chronic kidney disease stage III 02/03/2015  . Acute on chronic combined systolic and diastolic CHF, NYHA class 3 ([place]) 01/30/2015  . Cystitis 10/28/2014  . Insomnia 10/28/2014  . Alzheimer's disease 10/28/2014  . Falls frequently 10/12/2014  . Leg weakness, bilateral 10/12/2014  . OSA on CPAP 09/05/2014   . Rapid palpitations 06/07/2014  . Acute chest pain 06/06/2014  . Chest pain 06/06/2014  . Thoracic back pain after fall  04/13/2013  . Stress at home 01/26/2013  . GERD (gastroesophageal reflux disease) 01/25/2013  . Abnormal EKG, marked new ant TWI this adm with negative Troponins,08/18/11 08/20/2011  . CAD S/P CFX PCI Sept 2011 08/18/2011  . Sleep apnea, cpap had been stopped secondary to "sinus issues" 08/18/2011  . [name] deficiency 03/20/2010  . Diabetes mellitus ([place]) 01/07/2008  . Hyperlipidemia 01/07/2008  . Essential hypertension 04/25/2007  . DIVERTICULOSIS, COLON 04/25/2007  . OSTEOARTHRITIS 04/25/2007  . Disorder of bone and cartilage 04/25/2007  . DIVERTICULITIS, HX OF 04/25/2007    Orientation RESPIRATION BLADDER Height & Weight     Self  Normal Incontinent Weight: 118 lb 13.3 oz (53.9 kg) Height:  _0  (160 cm)  BEHAVIORAL SYMPTOMS/MOOD NEUROLOGICAL BOWEL NUTRITION STATUS      Incontinent Diet(heart healthy/carb modified (which facility states equates to their reduced concentrated sweets with no added salt at table))  AMBULATORY STATUS COMMUNICATION OF NEEDS Skin   Limited Assist Verbally Normal                       Personal Care Assistance Level of Assistance  Bathing, Feeding, Dressing Bathing Assistance: Limited assistance Feeding assistance: Independent Dressing Assistance: Limited assistance     Functional Limitations Info             SPECIAL CARE FACTORS FREQUENCY  PT (By licensed PT)     PT Frequency: 3x/week  Contractures Contractures Info: Not present    Additional Factors Info  Code Status, Allergies Code Status Info: DNR Allergies Info: Clopidogrel Bisulfate, Lipitor, Namenda, LIsinopril           Current Medications (04/09/2018):  This is the current hospital active medication list Current Facility-Administered Medications  Medication Dose Route Frequency Provider Last Rate Last Dose  . acetaminophen  (TYLENOL) tablet 650 mg  650 mg Oral Q6H PRN Barton Dubois, MD       Or  . acetaminophen (TYLENOL) suppository 650 mg  650 mg Rectal Q6H PRN Barton Dubois, MD      . aspirin EC tablet 81 mg  81 mg Oral Daily Barton Dubois, MD   81 mg at 04/09/18 9507  . carvedilol (COREG) tablet 3.125 mg  3.125 mg Oral BID WC Barton Dubois, MD   3.125 mg at 04/09/18 0824  . heparin injection 5,000 Units  5,000 Units Subcutaneous Q8H Barton Dubois, MD   5,000 Units at 04/09/18 0504  . insulin aspart (novoLOG) injection 0-9 Units  0-9 Units Subcutaneous TID WC Barton Dubois, MD   2 Units at 04/08/18 1735  . isosorbide mononitrate (IMDUR) 24 hr tablet 15 mg  15 mg Oral Daily Barton Dubois, MD   15 mg at 04/09/18 0824  . ondansetron (ZOFRAN) tablet 4 mg  4 mg Oral Q6H PRN Barton Dubois, MD       Or  . ondansetron [facility]) injection 4 mg  4 mg Intravenous Q6H PRN Barton Dubois, MD      . potassium chloride SA (K-DUR,KLOR-CON) CR tablet 20 mEq  20 mEq Oral Daily Barton Dubois, MD   20 mEq at 04/09/18 2257     Discharge Medications: TAKE these medications   ACCU-CHEK AVIVA PLUS w/Device Kit Use to check blood sugars twice a day Dx. E11.9   aspirin 81 MG EC tablet Take 81 mg by mouth daily.   carvedilol 3.125 MG tablet Commonly known as:  COREG Take 1 tablet (3.125 mg total) by mouth daily. What changed:    medication strength  how much to take  when to take this  additional instructions   digoxin 0.125 MG tablet Commonly known as:  LANOXIN Take a half tablet by mouth daily. Patient needs office visit before refills will be given What changed:    how much to take  how to take this  when to take this  additional instructions   glipiZIDE 5 MG tablet Commonly known as:  GLUCOTROL Take 0.5 tablets (2.5 mg total) by mouth 2 (two) times daily before a meal.   isosorbide mononitrate 30 MG 24 hr tablet Commonly known as:  IMDUR Take 0.5 tablets (15 mg total) by mouth daily. What  changed:    medication strength  how much to take  additional instructions   sacubitril-valsartan 49-51 MG Commonly known as:  ENTRESTO Take 0.5 tablets by mouth 2 (two) times daily. What changed:  how much to take       Relevant Imaging Results:  Relevant Lab Results:   Additional Information SSN [id number], Clydene Pugh, LCSW

## 2018-04-09 NOTE — Clinical Social Work Note (Signed)
Patient discharged to [facility]. Facility provided transport. Patriciaann Clan with [facility], Interim Guardian notified. Husband is aware.  LCSW signing off.     Mayfield Schoene, Clydene Pugh, LCSW

## 2018-04-09 NOTE — Progress Notes (Signed)
Late entry.  Tuberculin 5 units was administered intradermally on 04/03/2018 at 12:24 PM. PPD was read on 04/05/2018 at 3:38 PM, there was no induration at the injection site.

## 2018-04-09 NOTE — Progress Notes (Signed)
AVS and medical transport printed and left in packet at desk for EMS.

## 2018-04-10 ENCOUNTER — Telehealth: Payer: Self-pay | Admitting: *Deleted

## 2018-04-10 NOTE — Telephone Encounter (Signed)
Pt was on TCM report admitted 03/31/18 for weakness and worsening mentation. Patient found to be dehydrated with acute on chronic renal failure and bradycardia. Pt D/C 04/09/18 to [facility] ALF...[name]

## 2018-04-13 ENCOUNTER — Telehealth: Payer: Self-pay | Admitting: Internal Medicine

## 2018-04-13 DIAGNOSIS — G309 Alzheimer's disease, unspecified: Secondary | ICD-10-CM | POA: Diagnosis not present

## 2018-04-13 DIAGNOSIS — G301 Alzheimer's disease with late onset: Secondary | ICD-10-CM | POA: Diagnosis not present

## 2018-04-13 DIAGNOSIS — F0280 Dementia in other diseases classified elsewhere without behavioral disturbance: Secondary | ICD-10-CM | POA: Diagnosis not present

## 2018-04-13 DIAGNOSIS — E1122 Type 2 diabetes mellitus with diabetic chronic kidney disease: Secondary | ICD-10-CM | POA: Diagnosis not present

## 2018-04-13 DIAGNOSIS — N183 Chronic kidney disease, stage 3 (moderate): Secondary | ICD-10-CM | POA: Diagnosis not present

## 2018-04-13 DIAGNOSIS — E1121 Type 2 diabetes mellitus with diabetic nephropathy: Secondary | ICD-10-CM | POA: Diagnosis not present

## 2018-04-13 DIAGNOSIS — I5042 Chronic combined systolic (congestive) and diastolic (congestive) heart failure: Secondary | ICD-10-CM | POA: Diagnosis not present

## 2018-04-13 DIAGNOSIS — I130 Hypertensive heart and chronic kidney disease with heart failure and stage 1 through stage 4 chronic kidney disease, or unspecified chronic kidney disease: Secondary | ICD-10-CM | POA: Diagnosis not present

## 2018-04-13 DIAGNOSIS — I2510 Atherosclerotic heart disease of native coronary artery without angina pectoris: Secondary | ICD-10-CM | POA: Diagnosis not present

## 2018-04-13 DIAGNOSIS — D631 Anemia in chronic kidney disease: Secondary | ICD-10-CM | POA: Diagnosis not present

## 2018-04-13 DIAGNOSIS — G4733 Obstructive sleep apnea (adult) (pediatric): Secondary | ICD-10-CM | POA: Diagnosis not present

## 2018-04-13 NOTE — Telephone Encounter (Signed)
Copied from [place] [id number]. Topic: General - Other >> Apr 13, 2018  3:26 PM Margot Ables wrote: Pt is at [facility] long term care facility - [facility] hospital for complications of kidney failure Requesting VO for PT  2x week for 4 weeks for exercise and gait training Secure VM if not able to answer

## 2018-04-13 NOTE — Telephone Encounter (Signed)
Verbals given, FYI 

## 2018-04-14 DIAGNOSIS — N183 Chronic kidney disease, stage 3 (moderate): Secondary | ICD-10-CM | POA: Diagnosis not present

## 2018-04-14 DIAGNOSIS — I130 Hypertensive heart and chronic kidney disease with heart failure and stage 1 through stage 4 chronic kidney disease, or unspecified chronic kidney disease: Secondary | ICD-10-CM | POA: Diagnosis not present

## 2018-04-14 DIAGNOSIS — G4733 Obstructive sleep apnea (adult) (pediatric): Secondary | ICD-10-CM | POA: Diagnosis not present

## 2018-04-14 DIAGNOSIS — I5042 Chronic combined systolic (congestive) and diastolic (congestive) heart failure: Secondary | ICD-10-CM | POA: Diagnosis not present

## 2018-04-14 DIAGNOSIS — E1122 Type 2 diabetes mellitus with diabetic chronic kidney disease: Secondary | ICD-10-CM | POA: Diagnosis not present

## 2018-04-14 DIAGNOSIS — I2510 Atherosclerotic heart disease of native coronary artery without angina pectoris: Secondary | ICD-10-CM | POA: Diagnosis not present

## 2018-04-14 DIAGNOSIS — D631 Anemia in chronic kidney disease: Secondary | ICD-10-CM | POA: Diagnosis not present

## 2018-04-14 DIAGNOSIS — G309 Alzheimer's disease, unspecified: Secondary | ICD-10-CM | POA: Diagnosis not present

## 2018-04-14 DIAGNOSIS — F0280 Dementia in other diseases classified elsewhere without behavioral disturbance: Secondary | ICD-10-CM | POA: Diagnosis not present

## 2018-04-15 NOTE — Telephone Encounter (Signed)
Thx

## 2018-04-16 DIAGNOSIS — E1122 Type 2 diabetes mellitus with diabetic chronic kidney disease: Secondary | ICD-10-CM | POA: Diagnosis not present

## 2018-04-16 DIAGNOSIS — N183 Chronic kidney disease, stage 3 (moderate): Secondary | ICD-10-CM | POA: Diagnosis not present

## 2018-04-16 DIAGNOSIS — G4733 Obstructive sleep apnea (adult) (pediatric): Secondary | ICD-10-CM | POA: Diagnosis not present

## 2018-04-16 DIAGNOSIS — I130 Hypertensive heart and chronic kidney disease with heart failure and stage 1 through stage 4 chronic kidney disease, or unspecified chronic kidney disease: Secondary | ICD-10-CM | POA: Diagnosis not present

## 2018-04-16 DIAGNOSIS — D631 Anemia in chronic kidney disease: Secondary | ICD-10-CM | POA: Diagnosis not present

## 2018-04-16 DIAGNOSIS — G309 Alzheimer's disease, unspecified: Secondary | ICD-10-CM | POA: Diagnosis not present

## 2018-04-16 DIAGNOSIS — I5042 Chronic combined systolic (congestive) and diastolic (congestive) heart failure: Secondary | ICD-10-CM | POA: Diagnosis not present

## 2018-04-16 DIAGNOSIS — F0280 Dementia in other diseases classified elsewhere without behavioral disturbance: Secondary | ICD-10-CM | POA: Diagnosis not present

## 2018-04-16 DIAGNOSIS — I2510 Atherosclerotic heart disease of native coronary artery without angina pectoris: Secondary | ICD-10-CM | POA: Diagnosis not present

## 2018-04-21 DIAGNOSIS — F0280 Dementia in other diseases classified elsewhere without behavioral disturbance: Secondary | ICD-10-CM | POA: Diagnosis not present

## 2018-04-21 DIAGNOSIS — I2510 Atherosclerotic heart disease of native coronary artery without angina pectoris: Secondary | ICD-10-CM | POA: Diagnosis not present

## 2018-04-21 DIAGNOSIS — G309 Alzheimer's disease, unspecified: Secondary | ICD-10-CM | POA: Diagnosis not present

## 2018-04-21 DIAGNOSIS — N183 Chronic kidney disease, stage 3 (moderate): Secondary | ICD-10-CM | POA: Diagnosis not present

## 2018-04-21 DIAGNOSIS — I130 Hypertensive heart and chronic kidney disease with heart failure and stage 1 through stage 4 chronic kidney disease, or unspecified chronic kidney disease: Secondary | ICD-10-CM | POA: Diagnosis not present

## 2018-04-21 DIAGNOSIS — G4733 Obstructive sleep apnea (adult) (pediatric): Secondary | ICD-10-CM | POA: Diagnosis not present

## 2018-04-21 DIAGNOSIS — I5042 Chronic combined systolic (congestive) and diastolic (congestive) heart failure: Secondary | ICD-10-CM | POA: Diagnosis not present

## 2018-04-21 DIAGNOSIS — D631 Anemia in chronic kidney disease: Secondary | ICD-10-CM | POA: Diagnosis not present

## 2018-04-21 DIAGNOSIS — E1122 Type 2 diabetes mellitus with diabetic chronic kidney disease: Secondary | ICD-10-CM | POA: Diagnosis not present

## 2018-04-22 DIAGNOSIS — D631 Anemia in chronic kidney disease: Secondary | ICD-10-CM | POA: Diagnosis not present

## 2018-04-22 DIAGNOSIS — I5042 Chronic combined systolic (congestive) and diastolic (congestive) heart failure: Secondary | ICD-10-CM | POA: Diagnosis not present

## 2018-04-22 DIAGNOSIS — G309 Alzheimer's disease, unspecified: Secondary | ICD-10-CM | POA: Diagnosis not present

## 2018-04-22 DIAGNOSIS — E1122 Type 2 diabetes mellitus with diabetic chronic kidney disease: Secondary | ICD-10-CM | POA: Diagnosis not present

## 2018-04-22 DIAGNOSIS — F0280 Dementia in other diseases classified elsewhere without behavioral disturbance: Secondary | ICD-10-CM | POA: Diagnosis not present

## 2018-04-22 DIAGNOSIS — I130 Hypertensive heart and chronic kidney disease with heart failure and stage 1 through stage 4 chronic kidney disease, or unspecified chronic kidney disease: Secondary | ICD-10-CM | POA: Diagnosis not present

## 2018-04-22 DIAGNOSIS — G4733 Obstructive sleep apnea (adult) (pediatric): Secondary | ICD-10-CM | POA: Diagnosis not present

## 2018-04-22 DIAGNOSIS — I2510 Atherosclerotic heart disease of native coronary artery without angina pectoris: Secondary | ICD-10-CM | POA: Diagnosis not present

## 2018-04-22 DIAGNOSIS — N183 Chronic kidney disease, stage 3 (moderate): Secondary | ICD-10-CM | POA: Diagnosis not present

## 2018-04-23 DIAGNOSIS — I130 Hypertensive heart and chronic kidney disease with heart failure and stage 1 through stage 4 chronic kidney disease, or unspecified chronic kidney disease: Secondary | ICD-10-CM | POA: Diagnosis not present

## 2018-04-23 DIAGNOSIS — G309 Alzheimer's disease, unspecified: Secondary | ICD-10-CM | POA: Diagnosis not present

## 2018-04-23 DIAGNOSIS — I2510 Atherosclerotic heart disease of native coronary artery without angina pectoris: Secondary | ICD-10-CM | POA: Diagnosis not present

## 2018-04-23 DIAGNOSIS — E1122 Type 2 diabetes mellitus with diabetic chronic kidney disease: Secondary | ICD-10-CM | POA: Diagnosis not present

## 2018-04-23 DIAGNOSIS — I5042 Chronic combined systolic (congestive) and diastolic (congestive) heart failure: Secondary | ICD-10-CM | POA: Diagnosis not present

## 2018-04-23 DIAGNOSIS — D631 Anemia in chronic kidney disease: Secondary | ICD-10-CM | POA: Diagnosis not present

## 2018-04-23 DIAGNOSIS — F0280 Dementia in other diseases classified elsewhere without behavioral disturbance: Secondary | ICD-10-CM | POA: Diagnosis not present

## 2018-04-23 DIAGNOSIS — G4733 Obstructive sleep apnea (adult) (pediatric): Secondary | ICD-10-CM | POA: Diagnosis not present

## 2018-04-23 DIAGNOSIS — N183 Chronic kidney disease, stage 3 (moderate): Secondary | ICD-10-CM | POA: Diagnosis not present

## 2018-04-28 DIAGNOSIS — F0280 Dementia in other diseases classified elsewhere without behavioral disturbance: Secondary | ICD-10-CM | POA: Diagnosis not present

## 2018-04-28 DIAGNOSIS — Z1331 Encounter for screening for depression: Secondary | ICD-10-CM | POA: Diagnosis not present

## 2018-04-28 DIAGNOSIS — N183 Chronic kidney disease, stage 3 (moderate): Secondary | ICD-10-CM | POA: Diagnosis not present

## 2018-04-28 DIAGNOSIS — E1165 Type 2 diabetes mellitus with hyperglycemia: Secondary | ICD-10-CM | POA: Diagnosis not present

## 2018-04-28 DIAGNOSIS — Z0001 Encounter for general adult medical examination with abnormal findings: Secondary | ICD-10-CM | POA: Diagnosis not present

## 2018-04-28 DIAGNOSIS — E785 Hyperlipidemia, unspecified: Secondary | ICD-10-CM | POA: Diagnosis not present

## 2018-04-28 DIAGNOSIS — Z1389 Encounter for screening for other disorder: Secondary | ICD-10-CM | POA: Diagnosis not present

## 2018-04-28 DIAGNOSIS — G301 Alzheimer's disease with late onset: Secondary | ICD-10-CM | POA: Diagnosis not present

## 2018-04-28 DIAGNOSIS — Z23 Encounter for immunization: Secondary | ICD-10-CM | POA: Diagnosis not present

## 2018-04-28 DIAGNOSIS — I5042 Chronic combined systolic (congestive) and diastolic (congestive) heart failure: Secondary | ICD-10-CM | POA: Diagnosis not present

## 2018-04-28 DIAGNOSIS — Z0000 Encounter for general adult medical examination without abnormal findings: Secondary | ICD-10-CM | POA: Diagnosis not present

## 2018-04-28 DIAGNOSIS — D631 Anemia in chronic kidney disease: Secondary | ICD-10-CM | POA: Diagnosis not present

## 2018-04-28 DIAGNOSIS — G4733 Obstructive sleep apnea (adult) (pediatric): Secondary | ICD-10-CM | POA: Diagnosis not present

## 2018-04-28 DIAGNOSIS — E1121 Type 2 diabetes mellitus with diabetic nephropathy: Secondary | ICD-10-CM | POA: Diagnosis not present

## 2018-04-28 DIAGNOSIS — I130 Hypertensive heart and chronic kidney disease with heart failure and stage 1 through stage 4 chronic kidney disease, or unspecified chronic kidney disease: Secondary | ICD-10-CM | POA: Diagnosis not present

## 2018-04-28 DIAGNOSIS — I2510 Atherosclerotic heart disease of native coronary artery without angina pectoris: Secondary | ICD-10-CM | POA: Diagnosis not present

## 2018-04-28 DIAGNOSIS — E1122 Type 2 diabetes mellitus with diabetic chronic kidney disease: Secondary | ICD-10-CM | POA: Diagnosis not present

## 2018-04-28 DIAGNOSIS — I10 Essential (primary) hypertension: Secondary | ICD-10-CM | POA: Diagnosis not present

## 2018-04-28 DIAGNOSIS — G309 Alzheimer's disease, unspecified: Secondary | ICD-10-CM | POA: Diagnosis not present

## 2018-04-29 DIAGNOSIS — N183 Chronic kidney disease, stage 3 (moderate): Secondary | ICD-10-CM | POA: Diagnosis not present

## 2018-04-29 DIAGNOSIS — I130 Hypertensive heart and chronic kidney disease with heart failure and stage 1 through stage 4 chronic kidney disease, or unspecified chronic kidney disease: Secondary | ICD-10-CM | POA: Diagnosis not present

## 2018-04-29 DIAGNOSIS — I2510 Atherosclerotic heart disease of native coronary artery without angina pectoris: Secondary | ICD-10-CM | POA: Diagnosis not present

## 2018-04-29 DIAGNOSIS — D631 Anemia in chronic kidney disease: Secondary | ICD-10-CM | POA: Diagnosis not present

## 2018-04-29 DIAGNOSIS — G309 Alzheimer's disease, unspecified: Secondary | ICD-10-CM | POA: Diagnosis not present

## 2018-04-29 DIAGNOSIS — I5042 Chronic combined systolic (congestive) and diastolic (congestive) heart failure: Secondary | ICD-10-CM | POA: Diagnosis not present

## 2018-04-29 DIAGNOSIS — F0280 Dementia in other diseases classified elsewhere without behavioral disturbance: Secondary | ICD-10-CM | POA: Diagnosis not present

## 2018-04-29 DIAGNOSIS — G4733 Obstructive sleep apnea (adult) (pediatric): Secondary | ICD-10-CM | POA: Diagnosis not present

## 2018-04-29 DIAGNOSIS — E1122 Type 2 diabetes mellitus with diabetic chronic kidney disease: Secondary | ICD-10-CM | POA: Diagnosis not present

## 2018-04-30 DIAGNOSIS — D631 Anemia in chronic kidney disease: Secondary | ICD-10-CM | POA: Diagnosis not present

## 2018-04-30 DIAGNOSIS — I2510 Atherosclerotic heart disease of native coronary artery without angina pectoris: Secondary | ICD-10-CM | POA: Diagnosis not present

## 2018-04-30 DIAGNOSIS — G4733 Obstructive sleep apnea (adult) (pediatric): Secondary | ICD-10-CM | POA: Diagnosis not present

## 2018-04-30 DIAGNOSIS — G309 Alzheimer's disease, unspecified: Secondary | ICD-10-CM | POA: Diagnosis not present

## 2018-04-30 DIAGNOSIS — N183 Chronic kidney disease, stage 3 (moderate): Secondary | ICD-10-CM | POA: Diagnosis not present

## 2018-04-30 DIAGNOSIS — I130 Hypertensive heart and chronic kidney disease with heart failure and stage 1 through stage 4 chronic kidney disease, or unspecified chronic kidney disease: Secondary | ICD-10-CM | POA: Diagnosis not present

## 2018-04-30 DIAGNOSIS — I5042 Chronic combined systolic (congestive) and diastolic (congestive) heart failure: Secondary | ICD-10-CM | POA: Diagnosis not present

## 2018-04-30 DIAGNOSIS — E1122 Type 2 diabetes mellitus with diabetic chronic kidney disease: Secondary | ICD-10-CM | POA: Diagnosis not present

## 2018-04-30 DIAGNOSIS — F0280 Dementia in other diseases classified elsewhere without behavioral disturbance: Secondary | ICD-10-CM | POA: Diagnosis not present

## 2018-05-04 DIAGNOSIS — G4733 Obstructive sleep apnea (adult) (pediatric): Secondary | ICD-10-CM | POA: Diagnosis not present

## 2018-05-04 DIAGNOSIS — N183 Chronic kidney disease, stage 3 (moderate): Secondary | ICD-10-CM | POA: Diagnosis not present

## 2018-05-04 DIAGNOSIS — D631 Anemia in chronic kidney disease: Secondary | ICD-10-CM | POA: Diagnosis not present

## 2018-05-04 DIAGNOSIS — G309 Alzheimer's disease, unspecified: Secondary | ICD-10-CM | POA: Diagnosis not present

## 2018-05-04 DIAGNOSIS — I130 Hypertensive heart and chronic kidney disease with heart failure and stage 1 through stage 4 chronic kidney disease, or unspecified chronic kidney disease: Secondary | ICD-10-CM | POA: Diagnosis not present

## 2018-05-04 DIAGNOSIS — I5042 Chronic combined systolic (congestive) and diastolic (congestive) heart failure: Secondary | ICD-10-CM | POA: Diagnosis not present

## 2018-05-04 DIAGNOSIS — F0280 Dementia in other diseases classified elsewhere without behavioral disturbance: Secondary | ICD-10-CM | POA: Diagnosis not present

## 2018-05-04 DIAGNOSIS — E1122 Type 2 diabetes mellitus with diabetic chronic kidney disease: Secondary | ICD-10-CM | POA: Diagnosis not present

## 2018-05-04 DIAGNOSIS — I2510 Atherosclerotic heart disease of native coronary artery without angina pectoris: Secondary | ICD-10-CM | POA: Diagnosis not present

## 2018-05-05 DIAGNOSIS — I130 Hypertensive heart and chronic kidney disease with heart failure and stage 1 through stage 4 chronic kidney disease, or unspecified chronic kidney disease: Secondary | ICD-10-CM | POA: Diagnosis not present

## 2018-05-05 DIAGNOSIS — G309 Alzheimer's disease, unspecified: Secondary | ICD-10-CM | POA: Diagnosis not present

## 2018-05-05 DIAGNOSIS — I2510 Atherosclerotic heart disease of native coronary artery without angina pectoris: Secondary | ICD-10-CM | POA: Diagnosis not present

## 2018-05-05 DIAGNOSIS — D631 Anemia in chronic kidney disease: Secondary | ICD-10-CM | POA: Diagnosis not present

## 2018-05-05 DIAGNOSIS — G4733 Obstructive sleep apnea (adult) (pediatric): Secondary | ICD-10-CM | POA: Diagnosis not present

## 2018-05-05 DIAGNOSIS — F0280 Dementia in other diseases classified elsewhere without behavioral disturbance: Secondary | ICD-10-CM | POA: Diagnosis not present

## 2018-05-05 DIAGNOSIS — N183 Chronic kidney disease, stage 3 (moderate): Secondary | ICD-10-CM | POA: Diagnosis not present

## 2018-05-05 DIAGNOSIS — I5042 Chronic combined systolic (congestive) and diastolic (congestive) heart failure: Secondary | ICD-10-CM | POA: Diagnosis not present

## 2018-05-05 DIAGNOSIS — E1122 Type 2 diabetes mellitus with diabetic chronic kidney disease: Secondary | ICD-10-CM | POA: Diagnosis not present

## 2018-05-07 DIAGNOSIS — F0280 Dementia in other diseases classified elsewhere without behavioral disturbance: Secondary | ICD-10-CM | POA: Diagnosis not present

## 2018-05-07 DIAGNOSIS — D631 Anemia in chronic kidney disease: Secondary | ICD-10-CM | POA: Diagnosis not present

## 2018-05-07 DIAGNOSIS — I5042 Chronic combined systolic (congestive) and diastolic (congestive) heart failure: Secondary | ICD-10-CM | POA: Diagnosis not present

## 2018-05-07 DIAGNOSIS — I130 Hypertensive heart and chronic kidney disease with heart failure and stage 1 through stage 4 chronic kidney disease, or unspecified chronic kidney disease: Secondary | ICD-10-CM | POA: Diagnosis not present

## 2018-05-07 DIAGNOSIS — N183 Chronic kidney disease, stage 3 (moderate): Secondary | ICD-10-CM | POA: Diagnosis not present

## 2018-05-07 DIAGNOSIS — I2510 Atherosclerotic heart disease of native coronary artery without angina pectoris: Secondary | ICD-10-CM | POA: Diagnosis not present

## 2018-05-07 DIAGNOSIS — E1122 Type 2 diabetes mellitus with diabetic chronic kidney disease: Secondary | ICD-10-CM | POA: Diagnosis not present

## 2018-05-07 DIAGNOSIS — G4733 Obstructive sleep apnea (adult) (pediatric): Secondary | ICD-10-CM | POA: Diagnosis not present

## 2018-05-07 DIAGNOSIS — G309 Alzheimer's disease, unspecified: Secondary | ICD-10-CM | POA: Diagnosis not present

## 2018-05-08 ENCOUNTER — Encounter (HOSPITAL_COMMUNITY): Payer: Self-pay | Admitting: Emergency Medicine

## 2018-05-08 ENCOUNTER — Inpatient Hospital Stay (HOSPITAL_COMMUNITY)
Admission: EM | Admit: 2018-05-08 | Discharge: 2018-05-11 | DRG: 291 | Disposition: A | Discharge status: Skilled Nursing Facility | Payer: Medicare HMO | Attending: Family Medicine | Admitting: Family Medicine

## 2018-05-08 ENCOUNTER — Other Ambulatory Visit: Payer: Self-pay

## 2018-05-08 ENCOUNTER — Emergency Department (HOSPITAL_COMMUNITY): Payer: Medicare HMO

## 2018-05-08 DIAGNOSIS — R0902 Hypoxemia: Secondary | ICD-10-CM | POA: Diagnosis not present

## 2018-05-08 DIAGNOSIS — N189 Chronic kidney disease, unspecified: Secondary | ICD-10-CM | POA: Diagnosis present

## 2018-05-08 DIAGNOSIS — N390 Urinary tract infection, site not specified: Secondary | ICD-10-CM | POA: Diagnosis not present

## 2018-05-08 DIAGNOSIS — E1122 Type 2 diabetes mellitus with diabetic chronic kidney disease: Secondary | ICD-10-CM | POA: Diagnosis not present

## 2018-05-08 DIAGNOSIS — Z8673 Personal history of transient ischemic attack (TIA), and cerebral infarction without residual deficits: Secondary | ICD-10-CM

## 2018-05-08 DIAGNOSIS — I255 Ischemic cardiomyopathy: Secondary | ICD-10-CM | POA: Diagnosis not present

## 2018-05-08 DIAGNOSIS — M1990 Unspecified osteoarthritis, unspecified site: Secondary | ICD-10-CM | POA: Diagnosis present

## 2018-05-08 DIAGNOSIS — I130 Hypertensive heart and chronic kidney disease with heart failure and stage 1 through stage 4 chronic kidney disease, or unspecified chronic kidney disease: Secondary | ICD-10-CM | POA: Diagnosis not present

## 2018-05-08 DIAGNOSIS — Z66 Do not resuscitate: Secondary | ICD-10-CM | POA: Diagnosis present

## 2018-05-08 DIAGNOSIS — I340 Nonrheumatic mitral (valve) insufficiency: Secondary | ICD-10-CM | POA: Diagnosis not present

## 2018-05-08 DIAGNOSIS — D638 Anemia in other chronic diseases classified elsewhere: Secondary | ICD-10-CM | POA: Diagnosis not present

## 2018-05-08 DIAGNOSIS — M8580 Other specified disorders of bone density and structure, unspecified site: Secondary | ICD-10-CM | POA: Diagnosis present

## 2018-05-08 DIAGNOSIS — Z79899 Other long term (current) drug therapy: Secondary | ICD-10-CM

## 2018-05-08 DIAGNOSIS — I080 Rheumatic disorders of both mitral and aortic valves: Secondary | ICD-10-CM | POA: Diagnosis present

## 2018-05-08 DIAGNOSIS — I4891 Unspecified atrial fibrillation: Secondary | ICD-10-CM | POA: Diagnosis not present

## 2018-05-08 DIAGNOSIS — I2510 Atherosclerotic heart disease of native coronary artery without angina pectoris: Secondary | ICD-10-CM | POA: Diagnosis present

## 2018-05-08 DIAGNOSIS — I10 Essential (primary) hypertension: Secondary | ICD-10-CM | POA: Diagnosis present

## 2018-05-08 DIAGNOSIS — I5043 Acute on chronic combined systolic (congestive) and diastolic (congestive) heart failure: Secondary | ICD-10-CM | POA: Diagnosis not present

## 2018-05-08 DIAGNOSIS — I110 Hypertensive heart disease with heart failure: Secondary | ICD-10-CM | POA: Diagnosis not present

## 2018-05-08 DIAGNOSIS — Z90710 Acquired absence of both cervix and uterus: Secondary | ICD-10-CM | POA: Diagnosis not present

## 2018-05-08 DIAGNOSIS — F0281 Dementia in other diseases classified elsewhere with behavioral disturbance: Secondary | ICD-10-CM | POA: Diagnosis not present

## 2018-05-08 DIAGNOSIS — I313 Pericardial effusion (noninflammatory): Secondary | ICD-10-CM | POA: Diagnosis present

## 2018-05-08 DIAGNOSIS — Z7982 Long term (current) use of aspirin: Secondary | ICD-10-CM

## 2018-05-08 DIAGNOSIS — I252 Old myocardial infarction: Secondary | ICD-10-CM | POA: Diagnosis not present

## 2018-05-08 DIAGNOSIS — J9811 Atelectasis: Secondary | ICD-10-CM | POA: Diagnosis present

## 2018-05-08 DIAGNOSIS — I509 Heart failure, unspecified: Secondary | ICD-10-CM

## 2018-05-08 DIAGNOSIS — G4733 Obstructive sleep apnea (adult) (pediatric): Secondary | ICD-10-CM | POA: Diagnosis present

## 2018-05-08 DIAGNOSIS — F0280 Dementia in other diseases classified elsewhere without behavioral disturbance: Secondary | ICD-10-CM | POA: Diagnosis present

## 2018-05-08 DIAGNOSIS — B9620 Unspecified Escherichia coli [E. coli] as the cause of diseases classified elsewhere: Secondary | ICD-10-CM | POA: Diagnosis present

## 2018-05-08 DIAGNOSIS — E785 Hyperlipidemia, unspecified: Secondary | ICD-10-CM | POA: Diagnosis present

## 2018-05-08 DIAGNOSIS — Z8249 Family history of ischemic heart disease and other diseases of the circulatory system: Secondary | ICD-10-CM

## 2018-05-08 DIAGNOSIS — D631 Anemia in chronic kidney disease: Secondary | ICD-10-CM | POA: Diagnosis present

## 2018-05-08 DIAGNOSIS — G309 Alzheimer's disease, unspecified: Secondary | ICD-10-CM | POA: Diagnosis present

## 2018-05-08 DIAGNOSIS — N183 Chronic kidney disease, stage 3 (moderate): Secondary | ICD-10-CM | POA: Diagnosis present

## 2018-05-08 DIAGNOSIS — E119 Type 2 diabetes mellitus without complications: Secondary | ICD-10-CM

## 2018-05-08 DIAGNOSIS — R0602 Shortness of breath: Secondary | ICD-10-CM | POA: Diagnosis not present

## 2018-05-08 DIAGNOSIS — Z955 Presence of coronary angioplasty implant and graft: Secondary | ICD-10-CM

## 2018-05-08 DIAGNOSIS — Z7984 Long term (current) use of oral hypoglycemic drugs: Secondary | ICD-10-CM

## 2018-05-08 DIAGNOSIS — K219 Gastro-esophageal reflux disease without esophagitis: Secondary | ICD-10-CM | POA: Diagnosis present

## 2018-05-08 DIAGNOSIS — J80 Acute respiratory distress syndrome: Secondary | ICD-10-CM | POA: Diagnosis not present

## 2018-05-08 DIAGNOSIS — E0822 Diabetes mellitus due to underlying condition with diabetic chronic kidney disease: Secondary | ICD-10-CM | POA: Diagnosis not present

## 2018-05-08 DIAGNOSIS — Z888 Allergy status to other drugs, medicaments and biological substances status: Secondary | ICD-10-CM

## 2018-05-08 DIAGNOSIS — G301 Alzheimer's disease with late onset: Secondary | ICD-10-CM | POA: Diagnosis not present

## 2018-05-08 DIAGNOSIS — R0600 Dyspnea, unspecified: Secondary | ICD-10-CM | POA: Diagnosis not present

## 2018-05-08 DIAGNOSIS — I351 Nonrheumatic aortic (valve) insufficiency: Secondary | ICD-10-CM | POA: Diagnosis not present

## 2018-05-08 DIAGNOSIS — I5042 Chronic combined systolic (congestive) and diastolic (congestive) heart failure: Secondary | ICD-10-CM | POA: Diagnosis present

## 2018-05-08 LAB — URINALYSIS, ROUTINE W REFLEX MICROSCOPIC
Bilirubin Urine: NEGATIVE
Glucose, UA: NEGATIVE mg/dL
Hgb urine dipstick: NEGATIVE
Ketones, ur: 5 mg/dL — AB
Leukocytes, UA: NEGATIVE
Nitrite: POSITIVE — AB
Protein, ur: NEGATIVE mg/dL
Specific Gravity, Urine: 1.016 (ref 1.005–1.030)
pH: 5 (ref 5.0–8.0)

## 2018-05-08 LAB — BASIC METABOLIC PANEL
Anion gap: 7 (ref 5–15)
BUN: 23 mg/dL (ref 8–23)
CO2: 24 mmol/L (ref 22–32)
Calcium: 8.9 mg/dL (ref 8.9–10.3)
Chloride: 108 mmol/L (ref 98–111)
Creatinine, Ser: 1.49 mg/dL — ABNORMAL HIGH (ref 0.44–1.00)
GFR calc Af Amer: 34 mL/min — ABNORMAL LOW (ref >60)
GFR calc non Af Amer: 30 mL/min — ABNORMAL LOW (ref >60)
Glucose, Bld: 83 mg/dL (ref 70–99)
Potassium: 3.8 mmol/L (ref 3.5–5.1)
Sodium: 139 mmol/L (ref 135–145)

## 2018-05-08 LAB — CBC WITH DIFFERENTIAL/PLATELET
Basophils Absolute: 0 10*3/uL (ref 0.0–0.1)
Basophils Relative: 0 %
Eosinophils Absolute: 0.2 10*3/uL (ref 0.0–0.7)
Eosinophils Relative: 2 %
HCT: 25.8 % — ABNORMAL LOW (ref 36.0–46.0)
Hemoglobin: 8.3 g/dL — ABNORMAL LOW (ref 12.0–15.0)
Lymphocytes Relative: 21 %
Lymphs Abs: 1.7 10*3/uL (ref 0.7–4.0)
MCH: 31.1 pg (ref 26.0–34.0)
MCHC: 32.2 g/dL (ref 30.0–36.0)
MCV: 96.6 fL (ref 78.0–100.0)
Monocytes Absolute: 1.1 10*3/uL — ABNORMAL HIGH (ref 0.1–1.0)
Monocytes Relative: 13 %
Neutro Abs: 5.1 10*3/uL (ref 1.7–7.7)
Neutrophils Relative %: 64 %
Platelets: 252 10*3/uL (ref 150–400)
RBC: 2.67 MIL/uL — ABNORMAL LOW (ref 3.87–5.11)
RDW: 12.8 % (ref 11.5–15.5)
WBC: 8 10*3/uL (ref 4.0–10.5)

## 2018-05-08 LAB — BRAIN NATRIURETIC PEPTIDE: B Natriuretic Peptide: 1993 pg/mL — ABNORMAL HIGH (ref 0.0–100.0)

## 2018-05-08 LAB — TROPONIN I
Troponin I: 0.05 ng/mL (ref <0.03)
Troponin I: 0.1 ng/mL (ref <0.03)
Troponin I: 0.04 ng/mL (ref <0.03)

## 2018-05-08 LAB — POC OCCULT BLOOD, ED: FECAL OCCULT BLD: NEGATIVE

## 2018-05-08 MED ORDER — ONDANSETRON HCL 4 MG/2ML IJ SOLN: 4.0000 mg | Freq: Four times a day (QID) | INTRAMUSCULAR | Status: DC | PRN

## 2018-05-08 MED ORDER — ACETAMINOPHEN 325 MG PO TABS: 650.0000 mg | ORAL_TABLET | ORAL | Status: DC | PRN

## 2018-05-08 MED ORDER — MIRTAZAPINE 15 MG PO TABS
15.0000 mg | ORAL_TABLET | Freq: Every day | ORAL | Status: DC
  Administered 2018-05-08 – 2018-05-10 (×3): 15 mg via ORAL
  Filled 2018-05-08 (×3): qty 1

## 2018-05-08 MED ORDER — ISOSORBIDE MONONITRATE ER 30 MG PO TB24
15.0000 mg | ORAL_TABLET | Freq: Every day | ORAL | Status: DC
  Administered 2018-05-09 – 2018-05-11 (×3): 15 mg via ORAL
  Filled 2018-05-08 (×3): qty 1

## 2018-05-08 MED ORDER — SODIUM CHLORIDE 0.9% FLUSH
3.0000 mL | Freq: Two times a day (BID) | INTRAVENOUS | Status: DC
  Administered 2018-05-08 – 2018-05-11 (×5): 3 mL via INTRAVENOUS

## 2018-05-08 MED ORDER — ENOXAPARIN SODIUM 30 MG/0.3ML ~~LOC~~ SOLN
30.0000 mg | SUBCUTANEOUS | Status: DC
  Administered 2018-05-08 – 2018-05-10 (×3): 30 mg via SUBCUTANEOUS
  Filled 2018-05-08 (×3): qty 0.3

## 2018-05-08 MED ORDER — POTASSIUM CHLORIDE CRYS ER 20 MEQ PO TBCR
20.0000 meq | EXTENDED_RELEASE_TABLET | Freq: Two times a day (BID) | ORAL | Status: DC
  Administered 2018-05-08 – 2018-05-11 (×6): 20 meq via ORAL
  Filled 2018-05-08 (×6): qty 1

## 2018-05-08 MED ORDER — DOCUSATE SODIUM 100 MG PO CAPS
100.0000 mg | ORAL_CAPSULE | Freq: Two times a day (BID) | ORAL | Status: DC
  Administered 2018-05-08 – 2018-05-11 (×6): 100 mg via ORAL
  Filled 2018-05-08 (×6): qty 1

## 2018-05-08 MED ORDER — SODIUM CHLORIDE 0.9% FLUSH: 3.0000 mL | INTRAVENOUS | Status: DC | PRN

## 2018-05-08 MED ORDER — GLIPIZIDE 5 MG PO TABS
2.5000 mg | ORAL_TABLET | Freq: Two times a day (BID) | ORAL | Status: DC
  Filled 2018-05-08: qty 1

## 2018-05-08 MED ORDER — SACUBITRIL-VALSARTAN 49-51 MG PO TABS
0.5000 | ORAL_TABLET | Freq: Two times a day (BID) | ORAL | Status: DC
  Administered 2018-05-09 – 2018-05-11 (×5): 0.5 via ORAL
  Filled 2018-05-08 (×9): qty 0.5

## 2018-05-08 MED ORDER — CARVEDILOL 3.125 MG PO TABS
3.1250 mg | ORAL_TABLET | Freq: Every day | ORAL | Status: DC
  Administered 2018-05-09 – 2018-05-11 (×3): 3.125 mg via ORAL
  Filled 2018-05-08 (×3): qty 1

## 2018-05-08 MED ORDER — SODIUM CHLORIDE 0.9 % IV SOLN: 250.0000 mL | INTRAVENOUS | Status: DC | PRN

## 2018-05-08 MED ORDER — FUROSEMIDE 10 MG/ML IJ SOLN: 40.0000 mg | Freq: Two times a day (BID) | INTRAMUSCULAR | Status: DC

## 2018-05-08 MED ORDER — POTASSIUM CHLORIDE CRYS ER 20 MEQ PO TBCR
40.0000 meq | EXTENDED_RELEASE_TABLET | Freq: Once | ORAL | Status: AC
  Administered 2018-05-08: 40 meq via ORAL
  Filled 2018-05-08: qty 2

## 2018-05-08 MED ORDER — SODIUM CHLORIDE 0.9 % IV SOLN
1.0000 g | INTRAVENOUS | Status: DC
  Administered 2018-05-08: 1 g via INTRAVENOUS
  Filled 2018-05-08 (×4): qty 10

## 2018-05-08 MED ORDER — FUROSEMIDE 10 MG/ML IJ SOLN
60.0000 mg | Freq: Once | INTRAMUSCULAR | Status: AC
  Administered 2018-05-08: 60 mg via INTRAVENOUS
  Filled 2018-05-08: qty 6

## 2018-05-08 MED ORDER — DIGOXIN 125 MCG PO TABS
0.0625 mg | ORAL_TABLET | Freq: Every day | ORAL | Status: DC
  Administered 2018-05-09 – 2018-05-11 (×3): 0.0625 mg via ORAL
  Filled 2018-05-08 (×3): qty 1

## 2018-05-08 MED ORDER — ASPIRIN EC 81 MG PO TBEC
81.0000 mg | DELAYED_RELEASE_TABLET | Freq: Every day | ORAL | Status: DC
  Administered 2018-05-09 – 2018-05-11 (×3): 81 mg via ORAL
  Filled 2018-05-08 (×6): qty 1

## 2018-05-08 NOTE — ED Provider Notes (Signed)
[facility] EMERGENCY DEPARTMENT Provider Note   CSN: [id number] Arrival date & time: 05/08/18  1244     History   Chief Complaint Chief Complaint  Patient presents with  . Shortness of Breath    HPI Mary Cook is a 84 y.o. female.  HPI   84 year old female with dyspnea.  She is coming from nursing facility.  Apparently symptoms started today. She denies any other symptoms. She denies CP or acute pain anywhere else. No cough. No fever. No unusual leg pain or swelling.   Past Medical History:  Diagnosis Date  . CAD (coronary artery disease)    a. 04/2010 MI DES x 2 to LCX;  b. 05/2014 Cath: EF 35%, patent LCX stents w/ 70% stenosis in small AV groove LCX, Diag 50-60, RCA 50-60p->Med Rx.  . Cerebral aneurysm   . Chronic combined systolic (congestive) and diastolic (congestive) heart failure ([place])    a. 01/2015 Echo: EF 30-35%, sev inflat HK, Gr 1DD, mild AI.  Marland Kitchen Diverticulosis of colon   . Essential hypertension   . Frequent urination   . GIB (gastrointestinal bleeding)    a. 2002 Diverticular bleed.  . Hyperlipidemia   . Ischemic cardiomyopathy    a. 01/2015 Echo: EF 30-35%.  . Osteoarthritis   . Osteopenia   . Skin disorder   . Sleep apnea    cpap therapy  . Stroke ([place]) 2002  . T12 compression fracture ([place])    a. 04/2015.  Marland Kitchen Thrombus   . Type II diabetes mellitus ([place])   . Visual disorder     Patient Active Problem List   Diagnosis Date Noted  . Sinus bradycardia 04/01/2018  . AKI (acute kidney injury) ([place]) 03/31/2018  . Hyperkalemia 03/31/2018  . Anemia 03/31/2018  . Traumatic subarachnoid hemorrhage with loss of consciousness of 30 minutes or less ([place])   . Anemia of chronic disease 09/06/2016  . Gastroenteritis 07/22/2016  . Hip pain, chronic 06/03/2016  . Dyspnea 11/15/2015  . Acute respiratory failure with hypoxia ([place]) 11/15/2015  . CHF exacerbation ([place]) 11/15/2015  . Pain in the chest   . Chronic systolic CHF (congestive heart failure)  ([place])   . Ischemic cardiomyopathy   . T12 compression fracture ([place])   . Intertrigo 02/28/2015  . Actinic keratoses 02/28/2015  . Noncompliance with diet and medication regimen 02/28/2015  . LBP (low back pain) 02/13/2015  . Acute on chronic renal insufficiency 02/04/2015  . Cardiomyopathy, ischemic-EF 30-35% by echo 01/31/15 02/04/2015  . Chronic kidney disease stage III 02/03/2015  . Acute on chronic combined systolic and diastolic CHF, NYHA class 3 ([place]) 01/30/2015  . Cystitis 10/28/2014  . Insomnia 10/28/2014  . Alzheimer's disease 10/28/2014  . Falls frequently 10/12/2014  . Leg weakness, bilateral 10/12/2014  . OSA on CPAP 09/05/2014  . Rapid palpitations 06/07/2014  . Acute chest pain 06/06/2014  . Chest pain 06/06/2014  . Thoracic back pain after fall  04/13/2013  . Stress at home 01/26/2013  . GERD (gastroesophageal reflux disease) 01/25/2013  . Abnormal EKG, marked new ant TWI this adm with negative Troponins,08/18/11 08/20/2011  . CAD S/P CFX PCI Sept 2011 08/18/2011  . Sleep apnea, cpap had been stopped secondary to "sinus issues" 08/18/2011  . [name] deficiency 03/20/2010  . Diabetes mellitus ([place]) 01/07/2008  . Hyperlipidemia 01/07/2008  . Essential hypertension 04/25/2007  . DIVERTICULOSIS, COLON 04/25/2007  . OSTEOARTHRITIS 04/25/2007  . Disorder of bone and cartilage 04/25/2007  . DIVERTICULITIS, HX OF 04/25/2007  Past Surgical History:  Procedure Laterality Date  . ABDOMINAL HYSTERECTOMY  1980   no bso  . APPENDECTOMY    . CARDIAC CATHETERIZATION  07/2011   LAD OK, D1 80%, CFX 30% w/ patent stent, RCA 50-60%, EF 40%  . CARDIAC CATHETERIZATION    . CORONARY ANGIOPLASTY WITH STENT PLACEMENT  05/11/2010   stent CFX  . DOPPLER ECHOCARDIOGRAPHY    . LEFT HEART CATHETERIZATION WITH CORONARY ANGIOGRAM N/A 08/18/2011   Procedure: LEFT HEART CATHETERIZATION WITH CORONARY ANGIOGRAM;  Surgeon: Wellington Hampshire, MD;  Location: [facility] CATH LAB;  Service: Cardiovascular;   Laterality: N/A;  . LEFT HEART CATHETERIZATION WITH CORONARY ANGIOGRAM N/A 06/08/2014   Procedure: LEFT HEART CATHETERIZATION WITH CORONARY ANGIOGRAM;  Surgeon: Troy Sine, MD;  Location: [facility] CATH LAB;  Service: Cardiovascular;  Laterality: N/A;  . myocardial perfusion study    . THROMBECTOMY    . TONSILLECTOMY       OB History   None      Home Medications    Prior to Admission medications   Medication Sig Start Date End Date Taking? Authorizing Provider  acetaminophen (TYLENOL) 325 MG tablet Take 325 mg by mouth every 12 (twelve) hours as needed for mild pain or headache.   Yes [provider]  aspirin 81 MG EC tablet Take 81 mg by mouth daily.     Yes [provider]  Blood Glucose Monitoring Suppl (ACCU-CHEK AVIVA PLUS) w/Device KIT Use to check blood sugars twice a day Dx. E11.9 09/30/16  Yes Plotnikov, Evie Lacks, MD  carvedilol (COREG) 3.125 MG tablet Take 1 tablet (3.125 mg total) by mouth daily. 04/06/18  Yes Barton Dubois, MD  digoxin (LANOXIN) 0.125 MG tablet Take a half tablet by mouth daily. Patient needs office visit before refills will be given Patient taking differently: Take 0.0625 mg by mouth daily.  12/11/17  Yes Plotnikov, Evie Lacks, MD  docusate sodium (COLACE) 100 MG capsule Take 100 mg by mouth 2 (two) times daily.   Yes [provider]  glipiZIDE (GLUCOTROL) 5 MG tablet Take 0.5 tablets (2.5 mg total) by mouth 2 (two) times daily before a meal. 04/06/18 04/06/19 Yes Barton Dubois, MD  isosorbide mononitrate (IMDUR) 30 MG 24 hr tablet Take 0.5 tablets (15 mg total) by mouth daily. 04/06/18  Yes Barton Dubois, MD  mirtazapine (REMERON) 15 MG tablet Take 1 tablet by mouth at bedtime. 05/08/18  Yes [provider]  sacubitril-valsartan (ENTRESTO) 49-51 MG Take 0.5 tablets by mouth 2 (two) times daily. 04/06/18  Yes Barton Dubois, MD    Family History Family History  Problem Relation Age of Onset  . Hypertension Other   . CAD  Unknown        father    Social History Social History   Tobacco Use  . Smoking status: Never Smoker  . Smokeless tobacco: Never Used  Substance Use Topics  . Alcohol use: No  . Drug use: No     Allergies   Clopidogrel bisulfate; Lipitor [atorvastatin]; Namenda [memantine hcl]; and Lisinopril   Review of Systems Review of Systems  All systems reviewed and negative, other than as noted in HPI.  Physical Exam Updated Vital Signs BP (!) 143/72 (BP Location: Right Arm)   Pulse 80   Temp 98 F (36.7 C) (Oral)   Resp 18   Wt 53.5 kg   SpO2 97%   BMI 20.90 kg/m   Physical Exam  Constitutional: She appears well-developed and well-nourished. No distress.  HENT:  Head: Normocephalic and atraumatic.  Eyes: Conjunctivae are normal. Right eye exhibits no discharge. Left eye exhibits no discharge.  Neck: Neck supple.  Cardiovascular: Normal rate, regular rhythm and normal heart sounds. Exam reveals no gallop and no friction rub.  No murmur heard. Pulmonary/Chest: Effort normal and breath sounds normal. No respiratory distress.  Abdominal: Soft. She exhibits no distension. There is no tenderness.  Musculoskeletal: She exhibits no tenderness.  Very mild symmetric LE edema  Neurological: She is alert.  Skin: Skin is warm and dry.  Psychiatric: She has a normal mood and affect. Her behavior is normal. Thought content normal.  Nursing note and vitals reviewed.    ED Treatments / Results  Labs (all labs ordered are listed, but only abnormal results are displayed) Labs Reviewed  CBC WITH DIFFERENTIAL/PLATELET - Abnormal; Notable for the following components:      Result Value   RBC 2.67 (*)    Hemoglobin 8.3 (*)    HCT 25.8 (*)    Monocytes Absolute 1.1 (*)    All other components within normal limits  BASIC METABOLIC PANEL - Abnormal; Notable for the following components:   Creatinine, Ser 1.49 (*)    GFR calc non Af Amer 30 (*)    GFR calc Af Amer 34 (*)    All  other components within normal limits  BRAIN NATRIURETIC PEPTIDE - Abnormal; Notable for the following components:   B Natriuretic Peptide 1,993.0 (*)    All other components within normal limits  TROPONIN I - Abnormal; Notable for the following components:   Troponin I 0.05 (*)    All other components within normal limits  URINALYSIS, ROUTINE W REFLEX MICROSCOPIC - Abnormal; Notable for the following components:   Ketones, ur 5 (*)    Nitrite POSITIVE (*)    Bacteria, UA FEW (*)    All other components within normal limits  OCCULT BLOOD X 1 CARD TO LAB, STOOL    EKG EKG Interpretation  Date/Time:  Friday May 08 2018 13:00:21 EDT Ventricular Rate:  80 PR Interval:    QRS Duration: 110 QT Interval:  411 QTC Calculation: 475 R Axis:   -38 Text Interpretation:  Sinus rhythm Incomplete left bundle branch block Confirmed by Virgel Manifold [phone]) on 05/08/2018 2:46:54 PM   Radiology Dg Chest 2 View  Result Date: 05/08/2018 CLINICAL DATA:  Dyspnea. EXAM: CHEST - 2 VIEW COMPARISON:  Radiographs of March 31, 2018. FINDINGS: Stable cardiomegaly. Atherosclerosis of thoracic aorta is noted. Mild bibasilar subsegmental atelectasis or edema is noted with small pleural effusions. No pneumothorax is noted. Bony thorax is unremarkable. IMPRESSION: Interval development of mild bibasilar subsegmental atelectasis or edema with small pleural effusions. Aortic Atherosclerosis (ICD10-I70.0). Electronically Signed   By: Marijo Conception, M.D.   On: 05/08/2018 14:32    Procedures Procedures (including critical care time)  Medications Ordered in ED Medications  furosemide (LASIX) injection 60 mg (has no administration in time range)  potassium chloride SA (K-DUR,KLOR-CON) CR tablet 40 mEq (has no administration in time range)     Initial Impression / Assessment and Plan / ED Course  I have reviewed the triage vital signs and the nursing notes.  Pertinent labs & imaging results that were  available during my care of the patient were reviewed by me and considered in my medical decision making (see chart for details).     84 year old female with dyspnea.  Heart failure exacerbation.  Chest x-ray with some mild edema.  BNP  is significantly elevated increased from priors.  Also appears to be increasingly anemic.  Hemoglobin is down 2g/dl from a month ago.  May be dilutional.  Stool was Hemoccult negative. She is symptomatic. Dyspneic at rest. Borderline o2 sats at rest. Lasix ordered. Troponin mildly elevated. She denies CP. EKG w/o significant change from prior. Admit. Family updated.   Final Clinical Impressions(s) / ED Diagnoses   Final diagnoses:  Acute on chronic combined systolic and diastolic congestive heart failure [facility])    ED Discharge Orders    None       Virgel Manifold, MD 05/08/18 [phone]

## 2018-05-08 NOTE — H&P (Addendum)
History and Physical  RAVEN FURNAS DXA:[id number] DOB: 03/22/1926 DOA: 05/08/2018  Referring physician: Drr Wilson Singer, ED physician PCP: Cassandria Anger, MD  Outpatient Specialists:   Patient Coming From: [facility]  Chief Complaint: Shortness of breath  HPI: Mary Cook is a 84 y.o. female with a history of diabetes, stroke, coronary artery disease, combined systolic diastolic heart failure, sleep apnea, osteoarthritis, hypertension.  Patient brought to hospital for shortness of breath.  Patient is a poor historian secondary to Alzheimer's.  She lives at [facility].  Because of her shortness of breath, she was brought to the hospital for evaluation.  Initially patient 9% on room air.  Patient was given 60 mg of Lasix and oxygen saturation began to increase to mid 90s.  Chest x-ray shows pleural effusions with subsegmental atelectasis/edema.  BNP elevated at 1100.  Slightly elevated troponin at 0.04.  Review of Systems:   Review of systems unreliable secondary to Alzheimer's, however  Pt complains of urinary frequency.  Pt denies any fevers, chills, nausea, vomiting, diarrhea, constipation, abdominal pain, shortness of breath, dyspnea on exertion, orthopnea, cough, wheezing, palpitations, headache, vision changes, lightheadedness, dizziness, melena, rectal bleeding.  Review of systems are otherwise negative  Past Medical History:  Diagnosis Date  . CAD (coronary artery disease)    a. 04/2010 MI DES x 2 to LCX;  b. 05/2014 Cath: EF 35%, patent LCX stents w/ 70% stenosis in small AV groove LCX, Diag 50-60, RCA 50-60p->Med Rx.  . Cerebral aneurysm   . Chronic combined systolic (congestive) and diastolic (congestive) heart failure ([place])    a. 01/2015 Echo: EF 30-35%, sev inflat HK, Gr 1DD, mild AI.  Marland Kitchen Diverticulosis of colon   . Essential hypertension   . Frequent urination   . GIB (gastrointestinal bleeding)    a. 2002 Diverticular bleed.  .  Hyperlipidemia   . Ischemic cardiomyopathy    a. 01/2015 Echo: EF 30-35%.  . Osteoarthritis   . Osteopenia   . Skin disorder   . Sleep apnea    cpap therapy  . Stroke ([place]) 2002  . T12 compression fracture ([place])    a. 04/2015.  Marland Kitchen Thrombus   . Type II diabetes mellitus ([place])   . Visual disorder    Past Surgical History:  Procedure Laterality Date  . ABDOMINAL HYSTERECTOMY  1980   no bso  . APPENDECTOMY    . CARDIAC CATHETERIZATION  07/2011   LAD OK, D1 80%, CFX 30% w/ patent stent, RCA 50-60%, EF 40%  . CARDIAC CATHETERIZATION    . CORONARY ANGIOPLASTY WITH STENT PLACEMENT  05/11/2010   stent CFX  . DOPPLER ECHOCARDIOGRAPHY    . LEFT HEART CATHETERIZATION WITH CORONARY ANGIOGRAM N/A 08/18/2011   Procedure: LEFT HEART CATHETERIZATION WITH CORONARY ANGIOGRAM;  Surgeon: Wellington Hampshire, MD;  Location: [facility] CATH LAB;  Service: Cardiovascular;  Laterality: N/A;  . LEFT HEART CATHETERIZATION WITH CORONARY ANGIOGRAM N/A 06/08/2014   Procedure: LEFT HEART CATHETERIZATION WITH CORONARY ANGIOGRAM;  Surgeon: Troy Sine, MD;  Location: [facility] CATH LAB;  Service: Cardiovascular;  Laterality: N/A;  . myocardial perfusion study    . THROMBECTOMY    . TONSILLECTOMY     Social History:  reports that she has never smoked. She has never used smokeless tobacco. She reports that she does not drink alcohol or use drugs. Patient lives at [facility] facility  Allergies  Allergen Reactions  . Clopidogrel Bisulfate Nausea And Vomiting    Patient is  not aware of this allergy  . Lipitor [Atorvastatin] Other (See Comments)    Weak legs  . Namenda [Memantine Hcl]     N/v/d  . Lisinopril Cough    Patient is not aware of this allergy    Family History  Problem Relation Age of Onset  . Hypertension Other   . CAD Unknown        father      Prior to Admission medications   Medication Sig Start Date End Date Taking? Authorizing Provider  acetaminophen (TYLENOL) 325 MG tablet Take 325 mg by  mouth every 12 (twelve) hours as needed for mild pain or headache.   Yes [provider]  aspirin 81 MG EC tablet Take 81 mg by mouth daily.     Yes [provider]  Blood Glucose Monitoring Suppl (ACCU-CHEK AVIVA PLUS) w/Device KIT Use to check blood sugars twice a day Dx. E11.9 09/30/16  Yes Plotnikov, Evie Lacks, MD  carvedilol (COREG) 3.125 MG tablet Take 1 tablet (3.125 mg total) by mouth daily. 04/06/18  Yes Barton Dubois, MD  digoxin (LANOXIN) 0.125 MG tablet Take a half tablet by mouth daily. Patient needs office visit before refills will be given Patient taking differently: Take 0.0625 mg by mouth daily.  12/11/17  Yes Plotnikov, Evie Lacks, MD  docusate sodium (COLACE) 100 MG capsule Take 100 mg by mouth 2 (two) times daily.   Yes [provider]  glipiZIDE (GLUCOTROL) 5 MG tablet Take 0.5 tablets (2.5 mg total) by mouth 2 (two) times daily before a meal. 04/06/18 04/06/19 Yes Barton Dubois, MD  isosorbide mononitrate (IMDUR) 30 MG 24 hr tablet Take 0.5 tablets (15 mg total) by mouth daily. 04/06/18  Yes Barton Dubois, MD  mirtazapine (REMERON) 15 MG tablet Take 1 tablet by mouth at bedtime. 05/08/18  Yes [provider]  sacubitril-valsartan (ENTRESTO) 49-51 MG Take 0.5 tablets by mouth 2 (two) times daily. 04/06/18  Yes Barton Dubois, MD    Physical Exam: BP 127/90   Pulse 88   Temp 98 F (36.7 C) (Oral)   Resp (!) 29   Wt 53.5 kg   SpO2 94%   BMI 20.90 kg/m   . General: Elderly Caucasian female. Awake and alert and oriented x3. No acute cardiopulmonary distress.  Marland Kitchen HEENT: Normocephalic atraumatic.  Right and left ears normal in appearance.  Pupils equal, round, reactive to light. Extraocular muscles are intact. Sclerae anicteric and noninjected.  Moist mucosal membranes. No mucosal lesions.  . Neck: Neck supple without lymphadenopathy. No carotid bruits. No masses palpated.  . Cardiovascular: Regular rate with normal S1-S2 sounds. No murmurs, rubs,  gallops auscultated.  Elevated JVD to approximately 8 cm.  Marland Kitchen Respiratory: Good respiratory effort.  Rales in bases bilaterally.  No wheezes or rhonchi.  No accessory muscle use. . Abdomen: Soft, nontender, nondistended. Active bowel sounds. No masses or hepatosplenomegaly  . Skin: No rashes, lesions, or ulcerations.  Dry, warm to touch. 2+ dorsalis pedis and radial pulses. . Musculoskeletal: No calf or leg pain. All major joints not erythematous nontender.  No upper or lower joint deformation.  Good ROM.  No contractures  . Psychiatric: Intact judgment and insight. Pleasant and cooperative. . Neurologic: No focal neurological deficits. Strength is 5/5 and symmetric in upper and lower extremities.  Cranial nerves II through XII are grossly intact.           Labs on Admission: I have personally reviewed following labs and imaging studies  CBC: Recent  Labs  Lab 05/08/18 1406  WBC 8.0  NEUTROABS 5.1  HGB 8.3*  HCT 25.8*  MCV 96.6  PLT 144   Basic Metabolic Panel: Recent Labs  Lab 05/08/18 1406  NA 139  K 3.8  CL 108  CO2 24  GLUCOSE 83  BUN 23  CREATININE 1.49*  CALCIUM 8.9   GFR: Estimated Creatinine Clearance: 20.8 mL/min (A) (by C-G formula based on SCr of 1.49 mg/dL (H)). Liver Function Tests: No results for input(s): AST, ALT, ALKPHOS, BILITOT, PROT, ALBUMIN in the last 168 hours. No results for input(s): LIPASE, AMYLASE in the last 168 hours. No results for input(s): AMMONIA in the last 168 hours. Coagulation Profile: No results for input(s): INR, PROTIME in the last 168 hours. Cardiac Enzymes: Recent Labs  Lab 05/08/18 1406  TROPONINI 0.05*   BNP (last 3 results) No results for input(s): PROBNP in the last 8760 hours. HbA1C: No results for input(s): HGBA1C in the last 72 hours. CBG: No results for input(s): GLUCAP in the last 168 hours. Lipid Profile: No results for input(s): CHOL, HDL, LDLCALC, TRIG, CHOLHDL, LDLDIRECT in the last 72 hours. Thyroid  Function Tests: No results for input(s): TSH, T4TOTAL, FREET4, T3FREE, THYROIDAB in the last 72 hours. Anemia Panel: No results for input(s): VITAMINB12, FOLATE, FERRITIN, TIBC, IRON, RETICCTPCT in the last 72 hours. Urine analysis:    Component Value Date/Time   COLORURINE YELLOW 05/08/2018 1448   APPEARANCEUR CLEAR 05/08/2018 1448   LABSPEC 1.016 05/08/2018 1448   PHURINE 5.0 05/08/2018 1448   GLUCOSEU NEGATIVE 05/08/2018 1448   GLUCOSEU NEGATIVE 10/12/2014 1053   HGBUR NEGATIVE 05/08/2018 1448   BILIRUBINUR NEGATIVE 05/08/2018 1448   KETONESUR 5 (A) 05/08/2018 1448   PROTEINUR NEGATIVE 05/08/2018 1448   UROBILINOGEN 1.0 04/28/2015 1658   NITRITE POSITIVE (A) 05/08/2018 1448   LEUKOCYTESUR NEGATIVE 05/08/2018 1448   Sepsis Labs: '@LABRCNTIP'$ (procalcitonin:4,lacticidven:4) )No results found for this or any previous visit (from the past 240 hour(s)).   Radiological Exams on Admission: Dg Chest 2 View  Result Date: 05/08/2018 CLINICAL DATA:  Dyspnea. EXAM: CHEST - 2 VIEW COMPARISON:  Radiographs of March 31, 2018. FINDINGS: Stable cardiomegaly. Atherosclerosis of thoracic aorta is noted. Mild bibasilar subsegmental atelectasis or edema is noted with small pleural effusions. No pneumothorax is noted. Bony thorax is unremarkable. IMPRESSION: Interval development of mild bibasilar subsegmental atelectasis or edema with small pleural effusions. Aortic Atherosclerosis (ICD10-I70.0). Electronically Signed   By: Marijo Conception, M.D.   On: 05/08/2018 14:32    EKG: Independently reviewed.  Sinus rhythm with left axis deviation.  Old septal infarct.  No acute ST changes  Assessment/Plan: Principal Problem:   CHF exacerbation (HCC) Active Problems:   Diabetes mellitus ([place])   Essential hypertension   SOB (shortness of breath)   Alzheimer's disease   Acute on chronic combined systolic and diastolic CHF, NYHA class 3 (HCC)   Chronic kidney disease stage III   Cardiomyopathy, ischemic-EF  30-35% by echo 01/31/15   Anemia of chronic disease    This patient was discussed with the ED physician, including pertinent vitals, physical exam findings, labs, and imaging.  We also discussed care given by the ED provider.  1. Shortness of breath a. Secondary to heart failure b. Hypoxic on presentation, however improving 2. Acute on chronic combined systolic and diastolic CHF  Telemetry monitoring Strict I/O Daily Weights Diuresis: Lasix 40 mg IV twice daily Potassium: 40 mEq twice a day by mouth Echo cardiac exam tomorrow Patient currently on  ARB and digoxin Repeat BMP tomorrow As troponin slightly elevated, will repeat and discontinue if normalized 3. UTI a. Rocephin b. Urine culture 4. Diabetes a. Continue home medications 5. Hypertension a. Continue home medications 6. Alzheimer's 7. Chronic kidney disease a. Creatinine currently at baseline 8. Cardiomyopathy a. Echo tomorrow 9. Anemia of chronic disease a. Slightly more anemic than at baseline b. Question whether this is dilutional c. Repeat CBC in the morning  DVT prophylaxis: Lovenox Consultants: None Code Status: DNR Family Communication: Patient's 2 sisters are present during interview and exam Disposition Plan: Patient to return [facility] to [facility] [place] following admission   Jacob Moores [facility] Pager [phone]  If 7PM-7AM, please contact night-coverage [email or web address] Password [email or web address]

## 2018-05-08 NOTE — ED Notes (Signed)
Date and time results received: 05/08/18 1543   Test: Troponin  Critical Value: 0.05  Name of Provider Notified: Dr. Wilson Singer  Orders Received? Or Actions Taken?: No new orders given.

## 2018-05-08 NOTE — ED Triage Notes (Signed)
Pt from [facility]. EMS called out for SOB.  Pt 90% on RA.  VSS

## 2018-05-09 ENCOUNTER — Inpatient Hospital Stay (HOSPITAL_COMMUNITY): Payer: Medicare HMO

## 2018-05-09 DIAGNOSIS — E0822 Diabetes mellitus due to underlying condition with diabetic chronic kidney disease: Secondary | ICD-10-CM

## 2018-05-09 DIAGNOSIS — I255 Ischemic cardiomyopathy: Secondary | ICD-10-CM

## 2018-05-09 DIAGNOSIS — I5043 Acute on chronic combined systolic (congestive) and diastolic (congestive) heart failure: Secondary | ICD-10-CM

## 2018-05-09 DIAGNOSIS — G301 Alzheimer's disease with late onset: Secondary | ICD-10-CM

## 2018-05-09 DIAGNOSIS — I340 Nonrheumatic mitral (valve) insufficiency: Secondary | ICD-10-CM

## 2018-05-09 DIAGNOSIS — D638 Anemia in other chronic diseases classified elsewhere: Secondary | ICD-10-CM

## 2018-05-09 DIAGNOSIS — R0602 Shortness of breath: Secondary | ICD-10-CM

## 2018-05-09 DIAGNOSIS — I10 Essential (primary) hypertension: Secondary | ICD-10-CM

## 2018-05-09 DIAGNOSIS — I351 Nonrheumatic aortic (valve) insufficiency: Secondary | ICD-10-CM

## 2018-05-09 DIAGNOSIS — F0281 Dementia in other diseases classified elsewhere with behavioral disturbance: Secondary | ICD-10-CM

## 2018-05-09 DIAGNOSIS — N183 Chronic kidney disease, stage 3 (moderate): Secondary | ICD-10-CM

## 2018-05-09 LAB — TROPONIN I: Troponin I: 0.04 ng/mL (ref <0.03)

## 2018-05-09 LAB — BASIC METABOLIC PANEL
Anion gap: 11 (ref 5–15)
BUN: 23 mg/dL (ref 8–23)
CHLORIDE: 106 mmol/L (ref 98–111)
CO2: 25 mmol/L (ref 22–32)
CREATININE: 1.7 mg/dL — AB (ref 0.44–1.00)
Calcium: 9.2 mg/dL (ref 8.9–10.3)
GFR calc Af Amer: 29 mL/min — ABNORMAL LOW (ref >60)
GFR calc non Af Amer: 25 mL/min — ABNORMAL LOW (ref >60)
GLUCOSE: 138 mg/dL — AB (ref 70–99)
POTASSIUM: 4.1 mmol/L (ref 3.5–5.1)
Sodium: 142 mmol/L (ref 135–145)

## 2018-05-09 LAB — CBC
HEMATOCRIT: 30.3 % — AB (ref 36.0–46.0)
HEMOGLOBIN: 9.6 g/dL — AB (ref 12.0–15.0)
MCH: 30.8 pg (ref 26.0–34.0)
MCHC: 31.7 g/dL (ref 30.0–36.0)
MCV: 97.1 fL (ref 78.0–100.0)
Platelets: 279 10*3/uL (ref 150–400)
RBC: 3.12 MIL/uL — AB (ref 3.87–5.11)
RDW: 12.8 % (ref 11.5–15.5)
WBC: 7.5 10*3/uL (ref 4.0–10.5)

## 2018-05-09 LAB — ECHOCARDIOGRAM COMPLETE
Height: 65 in
Weight: 2007.07 oz

## 2018-05-09 LAB — MRSA PCR SCREENING: MRSA by PCR: NEGATIVE

## 2018-05-09 MED ORDER — FUROSEMIDE 10 MG/ML IJ SOLN
30.0000 mg | Freq: Every day | INTRAMUSCULAR | Status: DC
  Administered 2018-05-09: 30 mg via INTRAVENOUS
  Filled 2018-05-09: qty 4

## 2018-05-09 MED ORDER — LORAZEPAM 2 MG/ML IJ SOLN: 0.5000 mg | Freq: Once | INTRAMUSCULAR | Status: DC

## 2018-05-09 MED ORDER — LORAZEPAM 2 MG/ML IJ SOLN
0.5000 mg | Freq: Once | INTRAMUSCULAR | Status: AC
  Administered 2018-05-09: 0.5 mg via INTRAVENOUS
  Filled 2018-05-09: qty 1

## 2018-05-09 MED ORDER — FUROSEMIDE 10 MG/ML IJ SOLN: 30.0000 mg | Freq: Every day | INTRAMUSCULAR | Status: DC

## 2018-05-09 MED ORDER — FUROSEMIDE 10 MG/ML IJ SOLN
40.0000 mg | Freq: Every day | INTRAMUSCULAR | Status: DC
  Filled 2018-05-09: qty 4

## 2018-05-09 NOTE — Progress Notes (Signed)
*  PRELIMINARY RESULTS* Echocardiogram 2D Echocardiogram has been performed.  Mary Cook 05/09/2018, 3:13 PM

## 2018-05-09 NOTE — Progress Notes (Signed)
PROGRESS NOTE    Mary Cook  FMB:[id number]  DOB: [birth year]  DOA: 05/08/2018 PCP: Cassandria Anger, MD   Brief Admission Hx: Mary Cook is a 84 y.o. female with a history of diabetes, stroke, coronary artery disease, combined systolic diastolic heart failure, sleep apnea, osteoarthritis, hypertension.  Patient brought to hospital for shortness of breath.  Patient is a poor historian secondary to Alzheimer's.  She lives at [facility] facility.   MDM/Assessment & Plan:   1. Acute on chronic combined systolic and diastolic CHF - Pt has diuresed very well overnight and will reduce Lasix dose as creatinine is bumping.  The patient is clinically improving.  See orders.  Continue current management. 2. Question of UTI- urinalysis is not convincing for UTI we will discontinue ceftriaxone, follow urine culture.  Patient clinically asymptomatic however she is demented. 3. Type 2 diabetes mellitus- discontinue glipizide while in the hospital due to high risk for hypoglycemia and also I am concerned about her creatinine rising. 4. Essential hypertension-resumed home medications and following. 5. Alzheimer's dementia-family has been visiting closely for support. 6. Anemia of chronic disease- hemoglobin has improved overnight.  It was likely dilutional due to volume overload.  Much improved after diuresis.  DVT prophylaxis: Lovenox Code Status: DNR Family Communication: At bedside Disposition Plan: Return to SNF in 1 to 2 days  Subjective: Patient denies any specific complaints however she is demented.  Objective: Vitals:   05/08/18 1738 05/08/18 2101 05/09/18 0500 05/09/18 0514  BP: (!) 150/75 130/63  140/64  Pulse: 64 76  98  Resp: 18 18  16   Temp: 97.7 F (36.5 C) 98.2 F (36.8 C)  98.2 F (36.8 C)  TempSrc: Oral Oral  Oral  SpO2: (!) 82% 95%  94%  Weight: 61.1 kg  56.9 kg   Height: 5\' 5"  (1.651 m)       Intake/Output Summary (Last 24 hours) at  05/09/2018 0838 Last data filed at 05/09/2018 0500 Gross per 24 hour  Intake 101.24 ml  Output 1451 ml  Net -1349.76 ml   Filed Weights   05/08/18 1257 05/08/18 1738 05/09/18 0500  Weight: 53.5 kg 61.1 kg 56.9 kg   REVIEW OF SYSTEMS  Unable to obtain due to dementia  Exam:  General exam: elderly female with dementia, arousable, NAD, cooperative.  Respiratory system: bibasilar crackles. No increased work of breathing. Cardiovascular system: normal S1 & S2 heard.  Gastrointestinal system: Abdomen is nondistended, soft and nontender. Normal bowel sounds heard. Central nervous system: Alert and demented. No focal neurological deficits. Extremities: no cyanosis or clubbing, no pretibial edema.   Data Reviewed: Basic Metabolic Panel: Recent Labs  Lab 05/08/18 1406 05/09/18 0509  NA 139 142  K 3.8 4.1  CL 108 106  CO2 24 25  GLUCOSE 83 138*  BUN 23 23  CREATININE 1.49* 1.70*  CALCIUM 8.9 9.2   Liver Function Tests: No results for input(s): AST, ALT, ALKPHOS, BILITOT, PROT, ALBUMIN in the last 168 hours. No results for input(s): LIPASE, AMYLASE in the last 168 hours. No results for input(s): AMMONIA in the last 168 hours. CBC: Recent Labs  Lab 05/08/18 1406 05/09/18 0509  WBC 8.0 7.5  NEUTROABS 5.1  --   HGB 8.3* 9.6*  HCT 25.8* 30.3*  MCV 96.6 97.1  PLT 252 279   Cardiac Enzymes: Recent Labs  Lab 05/08/18 1406 05/08/18 1741 05/08/18 2253 05/09/18 0509  TROPONINI 0.05* 0.10* 0.04* 0.04*   CBG (last 3)  No  results for input(s): GLUCAP in the last 72 hours. No results found for this or any previous visit (from the past 240 hour(s)).   Studies: Dg Chest 2 View  Result Date: 05/08/2018 CLINICAL DATA:  Dyspnea. EXAM: CHEST - 2 VIEW COMPARISON:  Radiographs of March 31, 2018. FINDINGS: Stable cardiomegaly. Atherosclerosis of thoracic aorta is noted. Mild bibasilar subsegmental atelectasis or edema is noted with small pleural effusions. No pneumothorax is noted.  Bony thorax is unremarkable. IMPRESSION: Interval development of mild bibasilar subsegmental atelectasis or edema with small pleural effusions. Aortic Atherosclerosis (ICD10-I70.0). Electronically Signed   By: Marijo Conception, M.D.   On: 05/08/2018 14:32     Scheduled Meds: . aspirin EC  81 mg Oral Daily  . carvedilol  3.125 mg Oral Daily  . digoxin  0.0625 mg Oral Daily  . docusate sodium  100 mg Oral BID  . enoxaparin (LOVENOX) injection  30 mg Subcutaneous Q24H  . furosemide  30 mg Intravenous Daily  . isosorbide mononitrate  15 mg Oral Daily  . mirtazapine  15 mg Oral QHS  . potassium chloride  20 mEq Oral BID  . sacubitril-valsartan  0.5 tablet Oral BID  . sodium chloride flush  3 mL Intravenous Q12H   Continuous Infusions: . sodium chloride      Principal Problem:   CHF exacerbation (HCC) Active Problems:   Diabetes mellitus ([place])   Essential hypertension   SOB (shortness of breath)   Alzheimer's disease   Acute on chronic combined systolic and diastolic CHF, NYHA class 3 (HCC)   Chronic kidney disease stage III   Cardiomyopathy, ischemic-EF 30-35% by echo 01/31/15   Anemia of chronic disease   Time spent:   Irwin Brakeman, MD, FAAFP [facility] Pager [phone]  If 7PM-7AM, please contact night-coverage [email or web address] Password [email or web address] 05/09/2018, 8:38 AM    LOS: 1 day

## 2018-05-09 NOTE — Progress Notes (Signed)
Patient is having trouble with memory and is sometimes confused. Will hold CPAP for now. Not sure if she wears at nursing home. She stopped her last sleep study at CPAP 11.

## 2018-05-09 NOTE — Progress Notes (Signed)
CPAP is still on hold due to dementia. Nursing aware.

## 2018-05-10 MED ORDER — SODIUM CHLORIDE 0.9 % IV SOLN
1.0000 g | INTRAVENOUS | Status: DC
  Administered 2018-05-10: 1 g via INTRAVENOUS
  Filled 2018-05-10 (×3): qty 10

## 2018-05-10 MED ORDER — FUROSEMIDE 40 MG PO TABS
40.0000 mg | ORAL_TABLET | Freq: Every day | ORAL | Status: DC
  Administered 2018-05-10 – 2018-05-11 (×2): 40 mg via ORAL
  Filled 2018-05-10 (×2): qty 1

## 2018-05-10 NOTE — Progress Notes (Signed)
PROGRESS NOTE   Mary Cook  ZGY:[id number]  DOB: 08-Nov-1925  DOA: 05/08/2018 PCP: Cassandria Anger, MD   Brief Admission Hx: Mary Cook is a 84 y.o. female with a history of diabetes, stroke, coronary artery disease, combined systolic diastolic heart failure, sleep apnea, osteoarthritis, hypertension.  Patient brought to hospital for shortness of breath.  Patient is a poor historian secondary to Alzheimer's.  She lives at [facility] facility.   MDM/Assessment & Plan:   1. Acute on chronic combined systolic and diastolic CHF - Pt continues to diurese well. She is now net negative 2.3L.  The patient is clinically improving.  Transition to oral lasix today 40 mg.  See orders.   2. UTI- Urine culture positive for >100,000 gram negative rods.  Ceftriaxone ordered.   She had pansensitive E coli 1 month ago.  3. Type 2 diabetes mellitus- discontinue glipizide while in the hospital due to high risk for hypoglycemia and also I am concerned about her creatinine rising. 4. Essential hypertension-resumed home medications and following. 5. Alzheimer's dementia-family has been visiting closely for support. 6. Anemia of chronic disease- hemoglobin much better.   It was likely dilutional due to volume overload.  Much improved after diuresis.  DVT prophylaxis: Lovenox Code Status: DNR Family Communication: At bedside Disposition Plan: Return to SNF tomorrow  Subjective: Patient denies any specific complaints.  (Pt has dementia)   Objective: Vitals:   05/09/18 0514 05/09/18 1508 05/09/18 2130 05/10/18 0502  BP: 140/64 (!) 141/65 130/82 94/75  Pulse: 98 93 90 82  Resp: 16 (!) 22 16 16   Temp: 98.2 F (36.8 C) 97.8 F (36.6 C) 97.8 F (36.6 C) 98 F (36.7 C)  TempSrc: Oral  Oral Oral  SpO2: 94% 94% (!) 74% 90%  Weight:    57.7 kg  Height:        Intake/Output Summary (Last 24 hours) at 05/10/2018 1048 Last data filed at 05/10/2018 0800 Gross per 24 hour  Intake  0 ml  Output 950 ml  Net -950 ml   Filed Weights   05/08/18 1738 05/09/18 0500 05/10/18 0502  Weight: 61.1 kg 56.9 kg 57.7 kg   REVIEW OF SYSTEMS  Unable to obtain due to dementia  Exam:  General exam: elderly female with dementia, arousable, NAD, cooperative.  Respiratory system: bibasilar crackles. No increased work of breathing. Cardiovascular system: normal S1 & S2 heard.  Gastrointestinal system: Abdomen is nondistended, soft and nontender. Normal bowel sounds heard. Central nervous system: Alert and demented. No focal neurological deficits. Extremities: no cyanosis or clubbing, no pretibial edema.   Data Reviewed: Basic Metabolic Panel: Recent Labs  Lab 05/08/18 1406 05/09/18 0509  NA 139 142  K 3.8 4.1  CL 108 106  CO2 24 25  GLUCOSE 83 138*  BUN 23 23  CREATININE 1.49* 1.70*  CALCIUM 8.9 9.2   Liver Function Tests: No results for input(s): AST, ALT, ALKPHOS, BILITOT, PROT, ALBUMIN in the last 168 hours. No results for input(s): LIPASE, AMYLASE in the last 168 hours. No results for input(s): AMMONIA in the last 168 hours. CBC: Recent Labs  Lab 05/08/18 1406 05/09/18 0509  WBC 8.0 7.5  NEUTROABS 5.1  --   HGB 8.3* 9.6*  HCT 25.8* 30.3*  MCV 96.6 97.1  PLT 252 279   Cardiac Enzymes: Recent Labs  Lab 05/08/18 1406 05/08/18 1741 05/08/18 2253 05/09/18 0509  TROPONINI 0.05* 0.10* 0.04* 0.04*   CBG (last 3)  No results for input(s):  GLUCAP in the last 72 hours. Recent Results (from the past 240 hour(s))  Culture, Urine     Status: Abnormal (Preliminary result)   Collection Time: 05/08/18  4:45 PM  Result Value Ref Range Status   Specimen Description   Final    URINE, CLEAN CATCH Performed at [facility], [address] [place], [place] [address]    Special Requests   Final    NONE Performed at [facility], [address] [place], [place] [address]    Culture >=100,000 COLONIES/mL GRAM NEGATIVE RODS (A)  Final   Report Status PENDING   Incomplete  MRSA PCR Screening     Status: None   Collection Time: 05/09/18  6:16 AM  Result Value Ref Range Status   MRSA by PCR NEGATIVE NEGATIVE Final    Comment:        The GeneXpert MRSA Assay (FDA approved for NASAL specimens only), is one component of a comprehensive MRSA colonization surveillance program. It is not intended to diagnose MRSA infection nor to guide or monitor treatment for MRSA infections. Performed at [facility], [address], [place], [place] [address]      Studies: Dg Chest 2 View  Result Date: 05/08/2018 CLINICAL DATA:  Dyspnea. EXAM: CHEST - 2 VIEW COMPARISON:  Radiographs of March 31, 2018. FINDINGS: Stable cardiomegaly. Atherosclerosis of thoracic aorta is noted. Mild bibasilar subsegmental atelectasis or edema is noted with small pleural effusions. No pneumothorax is noted. Bony thorax is unremarkable. IMPRESSION: Interval development of mild bibasilar subsegmental atelectasis or edema with small pleural effusions. Aortic Atherosclerosis (ICD10-I70.0). Electronically Signed   By: Marijo Conception, M.D.   On: 05/08/2018 14:32   Scheduled Meds: . aspirin EC  81 mg Oral Daily  . carvedilol  3.125 mg Oral Daily  . digoxin  0.0625 mg Oral Daily  . docusate sodium  100 mg Oral BID  . enoxaparin (LOVENOX) injection  30 mg Subcutaneous Q24H  . furosemide  40 mg Oral Daily  . isosorbide mononitrate  15 mg Oral Daily  . LORazepam  0.5 mg Intravenous Once  . mirtazapine  15 mg Oral QHS  . potassium chloride  20 mEq Oral BID  . sacubitril-valsartan  0.5 tablet Oral BID  . sodium chloride flush  3 mL Intravenous Q12H   Continuous Infusions: . sodium chloride      Principal Problem:   CHF exacerbation (HCC) Active Problems:   Diabetes mellitus ([place])   Essential hypertension   SOB (shortness of breath)   Alzheimer's disease   Acute on chronic combined systolic and diastolic CHF, NYHA class 3 (HCC)   Chronic kidney disease stage III    Cardiomyopathy, ischemic-EF 30-35% by echo 01/31/15   Anemia of chronic disease  Time spent:   Irwin Brakeman, MD, FAAFP [facility] Pager [phone]  If 7PM-7AM, please contact night-coverage [email or web address] Password [email or web address] 05/10/2018, 10:48 AM    LOS: 2 days

## 2018-05-10 NOTE — Progress Notes (Signed)
Patient placed on 2 liters of oxygen during nigh for low oxygen saturation.  Oxygen increased to low 90s.

## 2018-05-11 LAB — URINE CULTURE

## 2018-05-11 MED ORDER — FUROSEMIDE 20 MG PO TABS: 20.0000 mg | ORAL_TABLET | ORAL | Status: DC

## 2018-05-11 MED ORDER — POTASSIUM CHLORIDE CRYS ER 20 MEQ PO TBCR: 20.0000 meq | EXTENDED_RELEASE_TABLET | ORAL | Status: DC

## 2018-05-11 MED ORDER — POTASSIUM CHLORIDE CRYS ER 20 MEQ PO TBCR: 20.0000 meq | EXTENDED_RELEASE_TABLET | Freq: Every day | ORAL | Status: DC

## 2018-05-11 MED ORDER — DIGOXIN 125 MCG PO TABS: 0.0625 mg | ORAL_TABLET | Freq: Every day | ORAL | Status: AC

## 2018-05-11 MED ORDER — CEPHALEXIN 250 MG PO CAPS: 250.0000 mg | ORAL_CAPSULE | Freq: Three times a day (TID) | ORAL | 0 refills | Status: AC

## 2018-05-11 NOTE — [place] FL2 (Addendum)
[place] MEDICAID FL2 LEVEL OF CARE SCREENING TOOL     IDENTIFICATION  Patient Name: Mary Cook Birthdate: [birth year] Sex: female Admission Date (Current Location): 05/08/2018  [facility] and [place] Number:  [employer] and Address:  [facility] [address], [place]      Provider Number: [id number]  Attending Physician Name and Address:  Murlean Iba, MD  Relative Name and Phone Number:       Current Level of Care: Hospital Recommended Level of Care: [place] Prior Approval Number:    Date Approved/Denied:   PASRR Number:    Discharge Plan: Other (Comment)([facility])    Current Diagnoses: Patient Active Problem List   Diagnosis Date Noted  . Sinus bradycardia 04/01/2018  . Anemia 03/31/2018  . Traumatic subarachnoid hemorrhage with loss of consciousness of 30 minutes or less ([place])   . Anemia of chronic disease 09/06/2016  . Gastroenteritis 07/22/2016  . Hip pain, chronic 06/03/2016  . Dyspnea 11/15/2015  . Acute respiratory failure with hypoxia ([place]) 11/15/2015  . CHF exacerbation ([place]) 11/15/2015  . Pain in the chest   . Chronic systolic CHF (congestive heart failure) ([place])   . Ischemic cardiomyopathy   . T12 compression fracture ([place])   . Intertrigo 02/28/2015  . Actinic keratoses 02/28/2015  . Noncompliance with diet and medication regimen 02/28/2015  . LBP (low back pain) 02/13/2015  . Acute on chronic renal insufficiency 02/04/2015  . Cardiomyopathy, ischemic-EF 30-35% by echo 01/31/15 02/04/2015  . Chronic kidney disease stage III 02/03/2015  . Acute on chronic combined systolic and diastolic CHF, NYHA class 3 ([place]) 01/30/2015  . Cystitis 10/28/2014  . Insomnia 10/28/2014  . Alzheimer's disease 10/28/2014  . Falls frequently 10/12/2014  . Leg weakness, bilateral 10/12/2014  . OSA on CPAP 09/05/2014  . Rapid palpitations 06/07/2014  . Acute chest pain 06/06/2014  . Chest pain  06/06/2014  . Thoracic back pain after fall  04/13/2013  . Stress at home 01/26/2013  . GERD (gastroesophageal reflux disease) 01/25/2013  . SOB (shortness of breath) 08/28/2011  . Abnormal EKG, marked new ant TWI this adm with negative Troponins,08/18/11 08/20/2011  . CAD S/P CFX PCI Sept 2011 08/18/2011  . Sleep apnea, cpap had been stopped secondary to "sinus issues" 08/18/2011  . [name] deficiency 03/20/2010  . Diabetes mellitus ([place]) 01/07/2008  . Hyperlipidemia 01/07/2008  . Essential hypertension 04/25/2007  . DIVERTICULOSIS, COLON 04/25/2007  . OSTEOARTHRITIS 04/25/2007  . Disorder of bone and cartilage 04/25/2007  . DIVERTICULITIS, HX OF 04/25/2007    Orientation RESPIRATION BLADDER Height & Weight     Self, Place  Normal Incontinent Weight: 115 lb 3.2 oz (52.3 kg) Height:  _0  (165.1 cm)  BEHAVIORAL SYMPTOMS/MOOD NEUROLOGICAL BOWEL NUTRITION STATUS      Incontinent Diet(Low sodium heart healthy (at facility patient is on a reduced concentrated sweets, no added table salt diet).)  AMBULATORY STATUS COMMUNICATION OF NEEDS Skin   Limited Assist Verbally Normal                       Personal Care Assistance Level of Assistance  Bathing, Feeding, Dressing Bathing Assistance: Limited assistance Feeding assistance: Independent Dressing Assistance: Limited assistance     Functional Limitations Info  Sight, Hearing, Speech Sight Info: Adequate Hearing Info: Adequate Speech Info: Adequate    SPECIAL CARE FACTORS FREQUENCY  Contractures Contractures Info: Not present    Additional Factors Info  Code Status, Allergies Code Status Info: DNR Allergies Info: Clopidogrel Bisulfate, Lipitor, Nameda, Lisinopril           Current Medications (05/11/2018):  This is the current hospital active medication list Current Facility-Administered Medications  Medication Dose Route Frequency Provider Last Rate Last Dose  . 0.9 %  sodium chloride  infusion  250 mL Intravenous PRN Truett Mainland, DO      . acetaminophen (TYLENOL) tablet 650 mg  650 mg Oral Q4H PRN Truett Mainland, DO      . aspirin EC tablet 81 mg  81 mg Oral Daily Truett Mainland, DO   81 mg at 05/11/18 6578  . carvedilol (COREG) tablet 3.125 mg  3.125 mg Oral Daily Truett Mainland, DO   3.125 mg at 05/11/18 4696  . cefTRIAXone (ROCEPHIN) 1 g in sodium chloride 0.9 % 100 mL IVPB  1 g Intravenous Q24H Murlean Iba, MD   Stopped at 05/10/18 1307  . digoxin (LANOXIN) tablet 0.0625 mg  0.0625 mg Oral Daily Truett Mainland, DO   0.0625 mg at 05/11/18 2952  . docusate sodium (COLACE) capsule 100 mg  100 mg Oral BID Truett Mainland, DO   100 mg at 05/11/18 8413  . enoxaparin (LOVENOX) injection 30 mg  30 mg Subcutaneous Q24H Truett Mainland, DO   30 mg at 05/10/18 1733  . furosemide (LASIX) tablet 40 mg  40 mg Oral Daily Johnson, Clanford L, MD   40 mg at 05/11/18 0921  . isosorbide mononitrate (IMDUR) 24 hr tablet 15 mg  15 mg Oral Daily Truett Mainland, DO   15 mg at 05/11/18 0920  . LORazepam (ATIVAN) injection 0.5 mg  0.5 mg Intravenous Once Schorr, Rhetta Mura, NP      . mirtazapine (REMERON) tablet 15 mg  15 mg Oral QHS Truett Mainland, DO   15 mg at 05/10/18 2201  . ondansetron (ZOFRAN) injection 4 mg  4 mg Intravenous Q6H PRN Truett Mainland, DO      . potassium chloride SA (K-DUR,KLOR-CON) CR tablet 20 mEq  20 mEq Oral BID Truett Mainland, DO   20 mEq at 05/11/18 2440  . sacubitril-valsartan (ENTRESTO) 49-51 mg per tablet  0.5 tablet Oral BID Truett Mainland, DO   0.5 tablet at 05/11/18 1027  . sodium chloride flush (NS) 0.9 % injection 3 mL  3 mL Intravenous Q12H Truett Mainland, DO   3 mL at 05/11/18 2536  . sodium chloride flush (NS) 0.9 % injection 3 mL  3 mL Intravenous PRN Truett Mainland, DO         Discharge Medications: Medication List    TAKE these medications   ACCU-CHEK AVIVA PLUS w/Device Kit Use to check blood sugars twice a day  Dx. E11.9   acetaminophen 325 MG tablet Commonly known as:  TYLENOL Take 325 mg by mouth every 12 (twelve) hours as needed for mild pain or headache.   aspirin 81 MG EC tablet Take 81 mg by mouth daily.   carvedilol 3.125 MG tablet Commonly known as:  COREG Take 1 tablet (3.125 mg total) by mouth daily.   cephALEXin 250 MG capsule Commonly known as:  KEFLEX Take 1 capsule (250 mg total) by mouth 3 (three) times daily for 2 days. Start taking on:  05/12/2018   digoxin 0.125 MG tablet Commonly known as:  LANOXIN Take 0.5 tablets (  0.0625 mg total) by mouth daily.   docusate sodium 100 MG capsule Commonly known as:  COLACE Take 100 mg by mouth 2 (two) times daily.   furosemide 20 MG tablet Commonly known as:  LASIX Take 1 tablet (20 mg total) by mouth every other day. Start taking on:  05/12/2018   glipiZIDE 5 MG tablet Commonly known as:  GLUCOTROL Take 0.5 tablets (2.5 mg total) by mouth 2 (two) times daily before a meal.   isosorbide mononitrate 30 MG 24 hr tablet Commonly known as:  IMDUR Take 0.5 tablets (15 mg total) by mouth daily.   mirtazapine 15 MG tablet Commonly known as:  REMERON Take 1 tablet by mouth at bedtime.   potassium chloride SA 20 MEQ tablet Commonly known as:  K-DUR,KLOR-CON Take 1 tablet (20 mEq total) by mouth every other day. Start taking on:  05/12/2018   sacubitril-valsartan 49-51 MG Commonly known as:  ENTRESTO Take 0.5 tablets by mouth 2 (two) times daily.   Relevant Imaging Results:  Relevant Lab Results:   Additional Information SSN [id number] Clydene Pugh, LCSW

## 2018-05-11 NOTE — Progress Notes (Addendum)
Pt discharged back to [facility] today per Dr. Wynetta Emery. Pt's IV site D/C'd and WDL. Pt's VSS. AVS provided to [facility] Caregiver. Pt left floor in WC in stable condition accompanied facility employee. Voicemail left with legal guardian, Betsey Amen.

## 2018-05-11 NOTE — Clinical Social Work Note (Signed)
Facility notified of discharge and clinicals sent to facility. Mr. Mary Cook was at bedside and advised of discharge.   LCSW signing off.    Perry Molla, Clydene Pugh, LCSW

## 2018-05-11 NOTE — Discharge Summary (Addendum)
Physician Discharge Summary  Mary Cook GYJ:[id number] DOB: 11/12/1925 DOA: 05/08/2018  PCP: Cassandria Anger, MD Cardiologist: Dr. Shelva Majestic Admit date: 05/08/2018 Discharge date: 05/11/2018  Admitted From: [facility] Disposition: [place] Recommendations for Outpatient Follow-up:  1. Follow up with cardiologist Dr. Claiborne Billings in 2 weeks. 2. Please monitor blood glucose 3 times per day. 3. Please monitor weights at least once per week. 4. Please check BMP and CBC in 1 week to follow-up levels.  Discharge Condition: Stable CODE STATUS: DNR  Brief Hospitalization Summary: Please see all hospital notes, images, labs for full details of the hospitalization. Dr. Glenna Durand HPI: Mary Cook is a 84 y.o. female with a history of diabetes, stroke, coronary artery disease, combined systolic diastolic heart failure, sleep apnea, osteoarthritis, hypertension.  Patient brought to hospital for shortness of breath.  Patient is a poor historian secondary to Alzheimer's.  She lives at [facility] facility.  Because of her shortness of breath, she was brought to the hospital for evaluation.  Initially patient 9% on room air.  Patient was given 60 mg of Lasix and oxygen saturation began to increase to mid 90s.  Chest x-ray shows pleural effusions with subsegmental atelectasis/edema.  BNP elevated at 1100.  Slightly elevated troponin at 0.04. Brief Admission Hx: Mary M Shropshireis a 84 y.o.femalewith a history of diabetes, stroke, coronary artery disease, combined systolic diastolic heart failure, sleep apnea, osteoarthritis, hypertension. Patient brought to hospital for shortness of breath. Patient is a poor historian secondary to Alzheimer's. She lives at [facility] facility.   MDM/Assessment & Plan:   1. Acute on chronic combined systolic and diastolic CHF - Pt continues to diurese well. She is now net negative 2.3L.  The patient is clinically improving.   Transitioned to oral lasix.    Discharged on Lasix 20 mg p.o. every other day.  Monitor intake and output and weigh weekly.  The patient should follow-up with her cardiologist Dr. Shelva Majestic in 2 weeks for recheck.   The patient's echocardiogram is noted below her EF is 30-35% which is improved from the previous echocardiogram that was 20-25%.  Please see full echo report below.  The patient had not been taking any Lasix prior to this admission but she will be discharged on Lasix 20 mg every other day until she follows up with her cardiologist for further management. 2. UTI- Urine culture positive for >100,000 gram negative rods.  Ceftriaxone treated in the hospital and will discharge on cephalexin x2 more days.   She had pansensitive E coli in the urine culture. 3. Type 2 diabetes mellitus- resume home treatment plan and follow CBGs closely.. 4. Essential hypertension-resumed home medications and following. 5. Alzheimer's dementia-family has been visiting closely for support.  Patient has had no behavioral problems while in the hospital. 6. Anemia of chronic disease- hemoglobin much better.   It was likely dilutional due to volume overload.  Much improved after diuresis.  Recheck CBC in 1 week outpatient.  DVT prophylaxis: Lovenox Code Status: DNR Family Communication: At bedside Disposition Plan: Return to SNF  Discharge Diagnoses:  Principal Problem:   CHF exacerbation ([place]) Active Problems:   Diabetes mellitus ([place])   Essential hypertension   SOB (shortness of breath)   Alzheimer's disease   Acute on chronic combined systolic and diastolic CHF, NYHA class 3 (HCC)   Chronic kidney disease stage III   Cardiomyopathy, ischemic-EF 30-35% by echo 01/31/15   Anemia of chronic disease  Discharge Instructions: Discharge  Instructions    Call MD for:  difficulty breathing, headache or visual disturbances   Complete by:  As directed    Call MD for:  extreme fatigue   Complete by:  As  directed    Call MD for:  persistant dizziness or light-headedness   Complete by:  As directed    Call MD for:  persistant nausea and vomiting   Complete by:  As directed    Diet - low sodium heart healthy   Complete by:  As directed    Increase activity slowly   Complete by:  As directed      Allergies as of 05/11/2018      Reactions   Clopidogrel Bisulfate Nausea And Vomiting   Patient is not aware of this allergy   Lipitor [atorvastatin] Other (See Comments)   Weak legs   Namenda [memantine Hcl]    N/v/d   Lisinopril Cough   Patient is not aware of this allergy      Medication List    TAKE these medications   ACCU-CHEK AVIVA PLUS w/Device Kit Use to check blood sugars twice a day Dx. E11.9   acetaminophen 325 MG tablet Commonly known as:  TYLENOL Take 325 mg by mouth every 12 (twelve) hours as needed for mild pain or headache.   aspirin 81 MG EC tablet Take 81 mg by mouth daily.   carvedilol 3.125 MG tablet Commonly known as:  COREG Take 1 tablet (3.125 mg total) by mouth daily.   cephALEXin 250 MG capsule Commonly known as:  KEFLEX Take 1 capsule (250 mg total) by mouth 3 (three) times daily for 2 days. Start taking on:  05/12/2018   digoxin 0.125 MG tablet Commonly known as:  LANOXIN Take 0.5 tablets (0.0625 mg total) by mouth daily.   docusate sodium 100 MG capsule Commonly known as:  COLACE Take 100 mg by mouth 2 (two) times daily.   furosemide 20 MG tablet Commonly known as:  LASIX Take 1 tablet (20 mg total) by mouth every other day. Start taking on:  05/12/2018   glipiZIDE 5 MG tablet Commonly known as:  GLUCOTROL Take 0.5 tablets (2.5 mg total) by mouth 2 (two) times daily before a meal.   isosorbide mononitrate 30 MG 24 hr tablet Commonly known as:  IMDUR Take 0.5 tablets (15 mg total) by mouth daily.   mirtazapine 15 MG tablet Commonly known as:  REMERON Take 1 tablet by mouth at bedtime.   potassium chloride SA 20 MEQ tablet Commonly  known as:  K-DUR,KLOR-CON Take 1 tablet (20 mEq total) by mouth every other day. Start taking on:  05/12/2018   sacubitril-valsartan 49-51 MG Commonly known as:  ENTRESTO Take 0.5 tablets by mouth 2 (two) times daily.      Follow-up Information    Troy Sine, MD. Schedule an appointment as soon as possible for a visit in 2 week(s).   Specialty:  Cardiology Why:  Hospital Follow up Heart Failure exacerbation Contact information: [address] [place] [address] [phone]          Allergies  Allergen Reactions  . Clopidogrel Bisulfate Nausea And Vomiting    Patient is not aware of this allergy  . Lipitor [Atorvastatin] Other (See Comments)    Weak legs  . Namenda [Memantine Hcl]     N/v/d  . Lisinopril Cough    Patient is not aware of this allergy   Allergies as of 05/11/2018  Reactions   Clopidogrel Bisulfate Nausea And Vomiting   Patient is not aware of this allergy   Lipitor [atorvastatin] Other (See Comments)   Weak legs   Namenda [memantine Hcl]    N/v/d   Lisinopril Cough   Patient is not aware of this allergy      Medication List    TAKE these medications   ACCU-CHEK AVIVA PLUS w/Device Kit Use to check blood sugars twice a day Dx. E11.9   acetaminophen 325 MG tablet Commonly known as:  TYLENOL Take 325 mg by mouth every 12 (twelve) hours as needed for mild pain or headache.   aspirin 81 MG EC tablet Take 81 mg by mouth daily.   carvedilol 3.125 MG tablet Commonly known as:  COREG Take 1 tablet (3.125 mg total) by mouth daily.   cephALEXin 250 MG capsule Commonly known as:  KEFLEX Take 1 capsule (250 mg total) by mouth 3 (three) times daily for 2 days. Start taking on:  05/12/2018   digoxin 0.125 MG tablet Commonly known as:  LANOXIN Take 0.5 tablets (0.0625 mg total) by mouth daily.   docusate sodium 100 MG capsule Commonly known as:  COLACE Take 100 mg by mouth 2 (two) times daily.   furosemide 20 MG  tablet Commonly known as:  LASIX Take 1 tablet (20 mg total) by mouth every other day. Start taking on:  05/12/2018   glipiZIDE 5 MG tablet Commonly known as:  GLUCOTROL Take 0.5 tablets (2.5 mg total) by mouth 2 (two) times daily before a meal.   isosorbide mononitrate 30 MG 24 hr tablet Commonly known as:  IMDUR Take 0.5 tablets (15 mg total) by mouth daily.   mirtazapine 15 MG tablet Commonly known as:  REMERON Take 1 tablet by mouth at bedtime.   potassium chloride SA 20 MEQ tablet Commonly known as:  K-DUR,KLOR-CON Take 1 tablet (20 mEq total) by mouth every other day. Start taking on:  05/12/2018   sacubitril-valsartan 49-51 MG Commonly known as:  ENTRESTO Take 0.5 tablets by mouth 2 (two) times daily.       Procedures/Studies: Dg Chest 2 View  Result Date: 05/08/2018 CLINICAL DATA:  Dyspnea. EXAM: CHEST - 2 VIEW COMPARISON:  Radiographs of March 31, 2018. FINDINGS: Stable cardiomegaly. Atherosclerosis of thoracic aorta is noted. Mild bibasilar subsegmental atelectasis or edema is noted with small pleural effusions. No pneumothorax is noted. Bony thorax is unremarkable. IMPRESSION: Interval development of mild bibasilar subsegmental atelectasis or edema with small pleural effusions. Aortic Atherosclerosis (ICD10-I70.0). Electronically Signed   By: Marijo Conception, M.D.   On: 05/08/2018 14:32     Echocardiogram 05/10/18 Study Conclusions  - Left ventricle: The cavity size was normal. Wall thickness was  normal. Systolic function was moderately to severely reduced. The estimated ejection fraction was in the range of 30% to 35%.    Diffuse hypokinesis. There is severe hypokinesis of the inferolateral and inferior myocardium. Indeterminate diastolic function. - Aortic valve: Mildly calcified annulus. Trileaflet. There was  mild regurgitation. - Mitral valve: Moderately to severely calcified annulus. Mildly calcified leaflets . There was mild to moderate regurgitation. - Right  atrium: Central venous pressure (est): 3 mm Hg. - Atrial septum: No defect or patent foramen ovale was identified. - Pulmonary arteries: PA peak pressure: 40 mm Hg (S). - Pericardium, extracardiac: A small pericardial effusion was identified anterior to the heart.  Subjective: Pt without complaints.  Pt eating and drinking well.  Pt has no difficulty breathing.  Pt  has dementia   Discharge Exam: Vitals:   05/11/18 0547 05/11/18 0830  BP: (!) 144/65 (!) 147/84  Pulse: 75 84  Resp:  12  Temp: 98.7 F (37.1 C) 98.9 F (37.2 C)  SpO2: 93% 95%   Vitals:   05/10/18 2125 05/11/18 0500 05/11/18 0547 05/11/18 0830  BP: (!) 148/55  (!) 144/65 (!) 147/84  Pulse: (!) 115  75 84  Resp: 16   12  Temp: 98.2 F (36.8 C)  98.7 F (37.1 C) 98.9 F (37.2 C)  TempSrc:   Oral Oral  SpO2: (!) 71%  93% 95%  Weight:  52.3 kg    Height:       General exam: elderly female with dementia, arousable, NAD, cooperative.  Respiratory system: bibasilar crackles. No increased work of breathing. Cardiovascular system: normal S1 & S2 heard.  Gastrointestinal system: Abdomen is nondistended, soft and nontender. Normal bowel sounds heard. Central nervous system: Alert and demented. No focal neurological deficits. Extremities: no cyanosis or clubbing, no pretibial edema.     The results of significant diagnostics from this hospitalization (including imaging, microbiology, ancillary and laboratory) are listed below for reference.     Microbiology: Recent Results (from the past 240 hour(s))  Culture, Urine     Status: Abnormal   Collection Time: 05/08/18  4:45 PM  Result Value Ref Range Status   Specimen Description   Final    URINE, CLEAN CATCH Performed at [facility], [address] [place], [place] [address]    Special Requests   Final    NONE Performed at [facility], [address] [place], [place] [address]    Culture >=100,000 COLONIES/mL ESCHERICHIA COLI (A)  Final   Report Status  05/11/2018 FINAL  Final   Organism ID, Bacteria ESCHERICHIA COLI (A)  Final      Susceptibility   Escherichia coli - MIC*    AMPICILLIN <=2 SENSITIVE Sensitive     CEFAZOLIN <=4 SENSITIVE Sensitive     CEFTRIAXONE <=1 SENSITIVE Sensitive     CIPROFLOXACIN <=0.25 SENSITIVE Sensitive     GENTAMICIN <=1 SENSITIVE Sensitive     IMIPENEM <=0.25 SENSITIVE Sensitive     NITROFURANTOIN <=16 SENSITIVE Sensitive     TRIMETH/SULFA <=20 SENSITIVE Sensitive     AMPICILLIN/SULBACTAM <=2 SENSITIVE Sensitive     PIP/TAZO <=4 SENSITIVE Sensitive     Extended ESBL NEGATIVE Sensitive     * >=100,000 COLONIES/mL ESCHERICHIA COLI  MRSA PCR Screening     Status: None   Collection Time: 05/09/18  6:16 AM  Result Value Ref Range Status   MRSA by PCR NEGATIVE NEGATIVE Final    Comment:        The GeneXpert MRSA Assay (FDA approved for NASAL specimens only), is one component of a comprehensive MRSA colonization surveillance program. It is not intended to diagnose MRSA infection nor to guide or monitor treatment for MRSA infections. Performed at [facility], [address], [place], [place] [address]      Labs: BNP (last 3 results) Recent Labs    05/08/18 1406  BNP 0,349.1*   Basic Metabolic Panel: Recent Labs  Lab 05/08/18 1406 05/09/18 0509  NA 139 142  K 3.8 4.1  CL 108 106  CO2 24 25  GLUCOSE 83 138*  BUN 23 23  CREATININE 1.49* 1.70*  CALCIUM 8.9 9.2   Liver Function Tests: No results for input(s): AST, ALT, ALKPHOS, BILITOT, PROT, ALBUMIN in the last 168 hours. No results for input(s): LIPASE,  AMYLASE in the last 168 hours. No results for input(s): AMMONIA in the last 168 hours. CBC: Recent Labs  Lab 05/08/18 1406 05/09/18 0509  WBC 8.0 7.5  NEUTROABS 5.1  --   HGB 8.3* 9.6*  HCT 25.8* 30.3*  MCV 96.6 97.1  PLT 252 279   Cardiac Enzymes: Recent Labs  Lab 05/08/18 1406 05/08/18 1741 05/08/18 2253 05/09/18 0509  TROPONINI 0.05* 0.10* 0.04* 0.04*    BNP: Invalid input(s): POCBNP CBG: No results for input(s): GLUCAP in the last 168 hours. D-Dimer No results for input(s): DDIMER in the last 72 hours. Hgb A1c No results for input(s): HGBA1C in the last 72 hours. Lipid Profile No results for input(s): CHOL, HDL, LDLCALC, TRIG, CHOLHDL, LDLDIRECT in the last 72 hours. Thyroid function studies No results for input(s): TSH, T4TOTAL, T3FREE, THYROIDAB in the last 72 hours.  Invalid input(s): FREET3 Anemia work up No results for input(s): VITAMINB12, FOLATE, FERRITIN, TIBC, IRON, RETICCTPCT in the last 72 hours. Urinalysis    Component Value Date/Time   COLORURINE YELLOW 05/08/2018 1448   APPEARANCEUR CLEAR 05/08/2018 1448   LABSPEC 1.016 05/08/2018 1448   PHURINE 5.0 05/08/2018 1448   GLUCOSEU NEGATIVE 05/08/2018 1448   GLUCOSEU NEGATIVE 10/12/2014 1053   HGBUR NEGATIVE 05/08/2018 1448   BILIRUBINUR NEGATIVE 05/08/2018 1448   KETONESUR 5 (A) 05/08/2018 1448   PROTEINUR NEGATIVE 05/08/2018 1448   UROBILINOGEN 1.0 04/28/2015 1658   NITRITE POSITIVE (A) 05/08/2018 1448   LEUKOCYTESUR NEGATIVE 05/08/2018 1448   Sepsis Labs Invalid input(s): PROCALCITONIN,  WBC,  LACTICIDVEN Microbiology Recent Results (from the past 240 hour(s))  Culture, Urine     Status: Abnormal   Collection Time: 05/08/18  4:45 PM  Result Value Ref Range Status   Specimen Description   Final    URINE, CLEAN CATCH Performed at [facility], [address] [place], [place] [address]    Special Requests   Final    NONE Performed at [facility], [address] [place], [place] [address]    Culture >=100,000 COLONIES/mL ESCHERICHIA COLI (A)  Final   Report Status 05/11/2018 FINAL  Final   Organism ID, Bacteria ESCHERICHIA COLI (A)  Final      Susceptibility   Escherichia coli - MIC*    AMPICILLIN <=2 SENSITIVE Sensitive     CEFAZOLIN <=4 SENSITIVE Sensitive     CEFTRIAXONE <=1 SENSITIVE Sensitive     CIPROFLOXACIN <=0.25 SENSITIVE Sensitive      GENTAMICIN <=1 SENSITIVE Sensitive     IMIPENEM <=0.25 SENSITIVE Sensitive     NITROFURANTOIN <=16 SENSITIVE Sensitive     TRIMETH/SULFA <=20 SENSITIVE Sensitive     AMPICILLIN/SULBACTAM <=2 SENSITIVE Sensitive     PIP/TAZO <=4 SENSITIVE Sensitive     Extended ESBL NEGATIVE Sensitive     * >=100,000 COLONIES/mL ESCHERICHIA COLI  MRSA PCR Screening     Status: None   Collection Time: 05/09/18  6:16 AM  Result Value Ref Range Status   MRSA by PCR NEGATIVE NEGATIVE Final    Comment:        The GeneXpert MRSA Assay (FDA approved for NASAL specimens only), is one component of a comprehensive MRSA colonization surveillance program. It is not intended to diagnose MRSA infection nor to guide or monitor treatment for MRSA infections. Performed at [facility], [address], [place], [place] [address]    Time coordinating discharge: 35 mins  SIGNED:  Irwin Brakeman, MD  [facility] 05/11/2018, 11:52 AM Pager [phone]  If  7PM-7AM, please contact night-coverage [email or web address] Password [email or web address]

## 2018-05-11 NOTE — Care Management Important Message (Signed)
Important Message  Patient Details  Name: Mary Cook MRN: [id number] Date of Birth: [birth year]   Medicare Important Message Given:  Yes    Shelda Altes 05/11/2018, 10:33 AM

## 2018-05-11 NOTE — Care Management Note (Signed)
Case Management Note  Patient Details  Name: Mary Cook MRN: [id number] Date of Birth: 1926/01/08       Admitted with CHF. Pt coming from [facility], was recently placed in LTC. Pt will return to ALF today with referral for [facility] nursing in the [facility]. CSW to arrange for return to facility and transportation. [facility] rep, given referral.              Expected Discharge Date:  05/11/18               Expected Discharge Plan:  Assisted Living / Rest Home(with home heath)  In-House Referral:  NA  Discharge planning Services  CM Consult  Post Acute Care Choice:  Home Health Choice offered to:  Patient  HH Arranged:  RN [place] Agency:  [facility]  Status of Service:  Completed, signed off  Sherald Barge, RN 05/11/2018, 1:41 PM

## 2018-05-12 NOTE — Care Management (Signed)
CM received call from Tammy at [facility] about concerns with [employer] Vest. CM unable to speak with guardian prior to DC on 05/11/18 and choice of [facility] provider was made based on pt's Dx, and readmissions.  Pt has DSS guardian, Rip Harbour, [place] spoke with Guardian and educated on Reds vest program. Guardian will have to make decision about [facility] provider for this patient.

## 2018-05-13 ENCOUNTER — Telehealth: Payer: Self-pay

## 2018-05-13 ENCOUNTER — Telehealth: Payer: Self-pay | Admitting: Cardiovascular Disease

## 2018-05-13 DIAGNOSIS — G4733 Obstructive sleep apnea (adult) (pediatric): Secondary | ICD-10-CM | POA: Diagnosis not present

## 2018-05-13 DIAGNOSIS — Z23 Encounter for immunization: Secondary | ICD-10-CM | POA: Diagnosis not present

## 2018-05-13 DIAGNOSIS — I130 Hypertensive heart and chronic kidney disease with heart failure and stage 1 through stage 4 chronic kidney disease, or unspecified chronic kidney disease: Secondary | ICD-10-CM | POA: Diagnosis not present

## 2018-05-13 DIAGNOSIS — G309 Alzheimer's disease, unspecified: Secondary | ICD-10-CM | POA: Diagnosis not present

## 2018-05-13 DIAGNOSIS — I2510 Atherosclerotic heart disease of native coronary artery without angina pectoris: Secondary | ICD-10-CM | POA: Diagnosis not present

## 2018-05-13 DIAGNOSIS — E1122 Type 2 diabetes mellitus with diabetic chronic kidney disease: Secondary | ICD-10-CM | POA: Diagnosis not present

## 2018-05-13 DIAGNOSIS — D631 Anemia in chronic kidney disease: Secondary | ICD-10-CM | POA: Diagnosis not present

## 2018-05-13 DIAGNOSIS — Z111 Encounter for screening for respiratory tuberculosis: Secondary | ICD-10-CM | POA: Diagnosis not present

## 2018-05-13 DIAGNOSIS — F0280 Dementia in other diseases classified elsewhere without behavioral disturbance: Secondary | ICD-10-CM | POA: Diagnosis not present

## 2018-05-13 DIAGNOSIS — N183 Chronic kidney disease, stage 3 (moderate): Secondary | ICD-10-CM | POA: Diagnosis not present

## 2018-05-13 DIAGNOSIS — I5042 Chronic combined systolic (congestive) and diastolic (congestive) heart failure: Secondary | ICD-10-CM | POA: Diagnosis not present

## 2018-05-13 NOTE — Telephone Encounter (Signed)
Pt is on TCM list. Dc'ed on 05/11/2018 after admission for acute on chronic combined systolic and diastolic congestive heart failure. Pt to follow up with cardiology.

## 2018-05-14 DIAGNOSIS — N390 Urinary tract infection, site not specified: Secondary | ICD-10-CM | POA: Diagnosis not present

## 2018-05-14 DIAGNOSIS — E1121 Type 2 diabetes mellitus with diabetic nephropathy: Secondary | ICD-10-CM | POA: Diagnosis not present

## 2018-05-14 DIAGNOSIS — N183 Chronic kidney disease, stage 3 (moderate): Secondary | ICD-10-CM | POA: Diagnosis not present

## 2018-05-14 DIAGNOSIS — E785 Hyperlipidemia, unspecified: Secondary | ICD-10-CM | POA: Diagnosis not present

## 2018-05-14 DIAGNOSIS — I5043 Acute on chronic combined systolic (congestive) and diastolic (congestive) heart failure: Secondary | ICD-10-CM | POA: Diagnosis not present

## 2018-05-14 DIAGNOSIS — E1165 Type 2 diabetes mellitus with hyperglycemia: Secondary | ICD-10-CM | POA: Diagnosis not present

## 2018-05-14 DIAGNOSIS — I10 Essential (primary) hypertension: Secondary | ICD-10-CM | POA: Diagnosis not present

## 2018-05-16 DIAGNOSIS — I2510 Atherosclerotic heart disease of native coronary artery without angina pectoris: Secondary | ICD-10-CM | POA: Diagnosis not present

## 2018-05-16 DIAGNOSIS — I5042 Chronic combined systolic (congestive) and diastolic (congestive) heart failure: Secondary | ICD-10-CM | POA: Diagnosis not present

## 2018-05-16 DIAGNOSIS — N183 Chronic kidney disease, stage 3 (moderate): Secondary | ICD-10-CM | POA: Diagnosis not present

## 2018-05-16 DIAGNOSIS — G309 Alzheimer's disease, unspecified: Secondary | ICD-10-CM | POA: Diagnosis not present

## 2018-05-16 DIAGNOSIS — F0280 Dementia in other diseases classified elsewhere without behavioral disturbance: Secondary | ICD-10-CM | POA: Diagnosis not present

## 2018-05-16 DIAGNOSIS — E1122 Type 2 diabetes mellitus with diabetic chronic kidney disease: Secondary | ICD-10-CM | POA: Diagnosis not present

## 2018-05-16 DIAGNOSIS — D631 Anemia in chronic kidney disease: Secondary | ICD-10-CM | POA: Diagnosis not present

## 2018-05-16 DIAGNOSIS — I130 Hypertensive heart and chronic kidney disease with heart failure and stage 1 through stage 4 chronic kidney disease, or unspecified chronic kidney disease: Secondary | ICD-10-CM | POA: Diagnosis not present

## 2018-05-16 DIAGNOSIS — G4733 Obstructive sleep apnea (adult) (pediatric): Secondary | ICD-10-CM | POA: Diagnosis not present

## 2018-05-18 DIAGNOSIS — D631 Anemia in chronic kidney disease: Secondary | ICD-10-CM | POA: Diagnosis not present

## 2018-05-18 DIAGNOSIS — F0280 Dementia in other diseases classified elsewhere without behavioral disturbance: Secondary | ICD-10-CM | POA: Diagnosis not present

## 2018-05-18 DIAGNOSIS — N183 Chronic kidney disease, stage 3 (moderate): Secondary | ICD-10-CM | POA: Diagnosis not present

## 2018-05-18 DIAGNOSIS — G4733 Obstructive sleep apnea (adult) (pediatric): Secondary | ICD-10-CM | POA: Diagnosis not present

## 2018-05-18 DIAGNOSIS — I5042 Chronic combined systolic (congestive) and diastolic (congestive) heart failure: Secondary | ICD-10-CM | POA: Diagnosis not present

## 2018-05-18 DIAGNOSIS — G309 Alzheimer's disease, unspecified: Secondary | ICD-10-CM | POA: Diagnosis not present

## 2018-05-18 DIAGNOSIS — I130 Hypertensive heart and chronic kidney disease with heart failure and stage 1 through stage 4 chronic kidney disease, or unspecified chronic kidney disease: Secondary | ICD-10-CM | POA: Diagnosis not present

## 2018-05-18 DIAGNOSIS — I2510 Atherosclerotic heart disease of native coronary artery without angina pectoris: Secondary | ICD-10-CM | POA: Diagnosis not present

## 2018-05-18 DIAGNOSIS — E1122 Type 2 diabetes mellitus with diabetic chronic kidney disease: Secondary | ICD-10-CM | POA: Diagnosis not present

## 2018-05-20 DIAGNOSIS — E1122 Type 2 diabetes mellitus with diabetic chronic kidney disease: Secondary | ICD-10-CM | POA: Diagnosis not present

## 2018-05-20 DIAGNOSIS — I5042 Chronic combined systolic (congestive) and diastolic (congestive) heart failure: Secondary | ICD-10-CM | POA: Diagnosis not present

## 2018-05-20 DIAGNOSIS — G309 Alzheimer's disease, unspecified: Secondary | ICD-10-CM | POA: Diagnosis not present

## 2018-05-20 DIAGNOSIS — D631 Anemia in chronic kidney disease: Secondary | ICD-10-CM | POA: Diagnosis not present

## 2018-05-20 DIAGNOSIS — I130 Hypertensive heart and chronic kidney disease with heart failure and stage 1 through stage 4 chronic kidney disease, or unspecified chronic kidney disease: Secondary | ICD-10-CM | POA: Diagnosis not present

## 2018-05-20 DIAGNOSIS — G4733 Obstructive sleep apnea (adult) (pediatric): Secondary | ICD-10-CM | POA: Diagnosis not present

## 2018-05-20 DIAGNOSIS — I2510 Atherosclerotic heart disease of native coronary artery without angina pectoris: Secondary | ICD-10-CM | POA: Diagnosis not present

## 2018-05-20 DIAGNOSIS — N183 Chronic kidney disease, stage 3 (moderate): Secondary | ICD-10-CM | POA: Diagnosis not present

## 2018-05-20 DIAGNOSIS — F0280 Dementia in other diseases classified elsewhere without behavioral disturbance: Secondary | ICD-10-CM | POA: Diagnosis not present

## 2018-05-22 ENCOUNTER — Encounter (HOSPITAL_COMMUNITY): Payer: Self-pay

## 2018-05-22 ENCOUNTER — Inpatient Hospital Stay (HOSPITAL_COMMUNITY)
Admission: EM | Admit: 2018-05-22 | Discharge: 2018-05-25 | DRG: 194 | Disposition: A | Discharge status: Home Health | Payer: Medicare HMO | Attending: Internal Medicine | Admitting: Internal Medicine

## 2018-05-22 ENCOUNTER — Emergency Department (HOSPITAL_COMMUNITY): Payer: Medicare HMO

## 2018-05-22 ENCOUNTER — Other Ambulatory Visit: Payer: Self-pay

## 2018-05-22 DIAGNOSIS — I130 Hypertensive heart and chronic kidney disease with heart failure and stage 1 through stage 4 chronic kidney disease, or unspecified chronic kidney disease: Secondary | ICD-10-CM | POA: Diagnosis not present

## 2018-05-22 DIAGNOSIS — R627 Adult failure to thrive: Secondary | ICD-10-CM

## 2018-05-22 DIAGNOSIS — M1990 Unspecified osteoarthritis, unspecified site: Secondary | ICD-10-CM | POA: Diagnosis present

## 2018-05-22 DIAGNOSIS — M8580 Other specified disorders of bone density and structure, unspecified site: Secondary | ICD-10-CM | POA: Diagnosis not present

## 2018-05-22 DIAGNOSIS — K219 Gastro-esophageal reflux disease without esophagitis: Secondary | ICD-10-CM | POA: Diagnosis not present

## 2018-05-22 DIAGNOSIS — I10 Essential (primary) hypertension: Secondary | ICD-10-CM | POA: Diagnosis present

## 2018-05-22 DIAGNOSIS — Z955 Presence of coronary angioplasty implant and graft: Secondary | ICD-10-CM

## 2018-05-22 DIAGNOSIS — Z8673 Personal history of transient ischemic attack (TIA), and cerebral infarction without residual deficits: Secondary | ICD-10-CM

## 2018-05-22 DIAGNOSIS — R778 Other specified abnormalities of plasma proteins: Secondary | ICD-10-CM

## 2018-05-22 DIAGNOSIS — I255 Ischemic cardiomyopathy: Secondary | ICD-10-CM | POA: Diagnosis present

## 2018-05-22 DIAGNOSIS — J40 Bronchitis, not specified as acute or chronic: Secondary | ICD-10-CM | POA: Diagnosis not present

## 2018-05-22 DIAGNOSIS — G4733 Obstructive sleep apnea (adult) (pediatric): Secondary | ICD-10-CM | POA: Diagnosis present

## 2018-05-22 DIAGNOSIS — R0902 Hypoxemia: Secondary | ICD-10-CM | POA: Diagnosis present

## 2018-05-22 DIAGNOSIS — R7989 Other specified abnormal findings of blood chemistry: Secondary | ICD-10-CM

## 2018-05-22 DIAGNOSIS — E785 Hyperlipidemia, unspecified: Secondary | ICD-10-CM | POA: Diagnosis present

## 2018-05-22 DIAGNOSIS — D638 Anemia in other chronic diseases classified elsewhere: Secondary | ICD-10-CM | POA: Diagnosis not present

## 2018-05-22 DIAGNOSIS — G309 Alzheimer's disease, unspecified: Secondary | ICD-10-CM | POA: Diagnosis present

## 2018-05-22 DIAGNOSIS — J189 Pneumonia, unspecified organism: Secondary | ICD-10-CM | POA: Diagnosis not present

## 2018-05-22 DIAGNOSIS — Z66 Do not resuscitate: Secondary | ICD-10-CM | POA: Diagnosis not present

## 2018-05-22 DIAGNOSIS — R509 Fever, unspecified: Secondary | ICD-10-CM | POA: Diagnosis not present

## 2018-05-22 DIAGNOSIS — I5022 Chronic systolic (congestive) heart failure: Secondary | ICD-10-CM | POA: Diagnosis present

## 2018-05-22 DIAGNOSIS — N189 Chronic kidney disease, unspecified: Secondary | ICD-10-CM | POA: Diagnosis present

## 2018-05-22 DIAGNOSIS — Z888 Allergy status to other drugs, medicaments and biological substances status: Secondary | ICD-10-CM

## 2018-05-22 DIAGNOSIS — I2510 Atherosclerotic heart disease of native coronary artery without angina pectoris: Secondary | ICD-10-CM | POA: Diagnosis present

## 2018-05-22 DIAGNOSIS — Y95 Nosocomial condition: Secondary | ICD-10-CM | POA: Diagnosis present

## 2018-05-22 DIAGNOSIS — F0280 Dementia in other diseases classified elsewhere without behavioral disturbance: Secondary | ICD-10-CM | POA: Diagnosis present

## 2018-05-22 DIAGNOSIS — I252 Old myocardial infarction: Secondary | ICD-10-CM

## 2018-05-22 DIAGNOSIS — Z79899 Other long term (current) drug therapy: Secondary | ICD-10-CM

## 2018-05-22 DIAGNOSIS — N183 Chronic kidney disease, stage 3 (moderate): Secondary | ICD-10-CM | POA: Diagnosis present

## 2018-05-22 DIAGNOSIS — G301 Alzheimer's disease with late onset: Secondary | ICD-10-CM | POA: Diagnosis not present

## 2018-05-22 DIAGNOSIS — N179 Acute kidney failure, unspecified: Secondary | ICD-10-CM | POA: Diagnosis not present

## 2018-05-22 DIAGNOSIS — Z6820 Body mass index (BMI) 20.0-20.9, adult: Secondary | ICD-10-CM

## 2018-05-22 DIAGNOSIS — Z7984 Long term (current) use of oral hypoglycemic drugs: Secondary | ICD-10-CM

## 2018-05-22 DIAGNOSIS — G4730 Sleep apnea, unspecified: Secondary | ICD-10-CM | POA: Diagnosis present

## 2018-05-22 DIAGNOSIS — Z7982 Long term (current) use of aspirin: Secondary | ICD-10-CM

## 2018-05-22 DIAGNOSIS — F0281 Dementia in other diseases classified elsewhere with behavioral disturbance: Secondary | ICD-10-CM | POA: Diagnosis not present

## 2018-05-22 DIAGNOSIS — E1122 Type 2 diabetes mellitus with diabetic chronic kidney disease: Secondary | ICD-10-CM | POA: Diagnosis not present

## 2018-05-22 LAB — CBC WITH DIFFERENTIAL/PLATELET
BASOS PCT: 0 %
Basophils Absolute: 0 10*3/uL (ref 0.0–0.1)
EOS ABS: 0.2 10*3/uL (ref 0.0–0.7)
EOS PCT: 2 %
HCT: 28.3 % — ABNORMAL LOW (ref 36.0–46.0)
HEMOGLOBIN: 9.3 g/dL — AB (ref 12.0–15.0)
Lymphocytes Relative: 11 %
Lymphs Abs: 1 10*3/uL (ref 0.7–4.0)
MCH: 31 pg (ref 26.0–34.0)
MCHC: 32.9 g/dL (ref 30.0–36.0)
MCV: 94.3 fL (ref 78.0–100.0)
MONO ABS: 0.9 10*3/uL (ref 0.1–1.0)
MONOS PCT: 9 %
NEUTROS PCT: 78 %
Neutro Abs: 7.1 10*3/uL (ref 1.7–7.7)
PLATELETS: 225 10*3/uL (ref 150–400)
RBC: 3 MIL/uL — ABNORMAL LOW (ref 3.87–5.11)
RDW: 12.1 % (ref 11.5–15.5)
WBC: 9.1 10*3/uL (ref 4.0–10.5)

## 2018-05-22 LAB — INFLUENZA PANEL BY PCR (TYPE A & B)
INFLAPCR: NEGATIVE
INFLBPCR: NEGATIVE

## 2018-05-22 LAB — TROPONIN I: Troponin I: 0.03 ng/mL (ref <0.03)

## 2018-05-22 LAB — URINALYSIS, ROUTINE W REFLEX MICROSCOPIC
Bilirubin Urine: NEGATIVE
GLUCOSE, UA: NEGATIVE mg/dL
Hgb urine dipstick: NEGATIVE
Ketones, ur: NEGATIVE mg/dL
LEUKOCYTES UA: NEGATIVE
NITRITE: NEGATIVE
PROTEIN: NEGATIVE mg/dL
Specific Gravity, Urine: 1.013 (ref 1.005–1.030)
pH: 6 (ref 5.0–8.0)

## 2018-05-22 LAB — DIGOXIN LEVEL: Digoxin Level: 0.4 ng/mL — ABNORMAL LOW (ref 0.8–2.0)

## 2018-05-22 LAB — COMPREHENSIVE METABOLIC PANEL
ALK PHOS: 59 U/L (ref 38–126)
ALT: 11 U/L (ref 0–44)
AST: 15 U/L (ref 15–41)
Albumin: 3 g/dL — ABNORMAL LOW (ref 3.5–5.0)
Anion gap: 6 (ref 5–15)
BUN: 20 mg/dL (ref 8–23)
CALCIUM: 8.8 mg/dL — AB (ref 8.9–10.3)
CO2: 24 mmol/L (ref 22–32)
CREATININE: 1.5 mg/dL — AB (ref 0.44–1.00)
Chloride: 106 mmol/L (ref 98–111)
GFR calc non Af Amer: 29 mL/min — ABNORMAL LOW (ref >60)
GFR, EST AFRICAN AMERICAN: 34 mL/min — AB (ref >60)
Glucose, Bld: 132 mg/dL — ABNORMAL HIGH (ref 70–99)
Potassium: 3.8 mmol/L (ref 3.5–5.1)
Sodium: 136 mmol/L (ref 135–145)
Total Bilirubin: 0.6 mg/dL (ref 0.3–1.2)
Total Protein: 6.5 g/dL (ref 6.5–8.1)

## 2018-05-22 LAB — I-STAT CG4 LACTIC ACID, ED
Lactic Acid, Venous: 0.36 mmol/L — ABNORMAL LOW (ref 0.5–1.9)
Lactic Acid, Venous: 0.82 mmol/L (ref 0.5–1.9)

## 2018-05-22 MED ORDER — METHYLPREDNISOLONE SODIUM SUCC 40 MG IJ SOLR
40.0000 mg | Freq: Once | INTRAMUSCULAR | Status: AC
  Administered 2018-05-23: 40 mg via INTRAVENOUS
  Filled 2018-05-22: qty 1

## 2018-05-22 MED ORDER — SODIUM CHLORIDE 0.9 % IV SOLN
500.0000 mg | INTRAVENOUS | Status: DC
  Administered 2018-05-22: 500 mg via INTRAVENOUS
  Filled 2018-05-22: qty 500

## 2018-05-22 MED ORDER — SODIUM CHLORIDE 0.9 % IV BOLUS (SEPSIS)
1000.0000 mL | Freq: Once | INTRAVENOUS | Status: AC
  Administered 2018-05-23: 1000 mL via INTRAVENOUS

## 2018-05-22 MED ORDER — SODIUM CHLORIDE 0.9 % IV SOLN
2.0000 g | INTRAVENOUS | Status: DC
  Administered 2018-05-22: 2 g via INTRAVENOUS
  Filled 2018-05-22: qty 20

## 2018-05-22 MED ORDER — KETOROLAC TROMETHAMINE 15 MG/ML IJ SOLN
15.0000 mg | Freq: Once | INTRAMUSCULAR | Status: AC
  Administered 2018-05-23: 15 mg via INTRAVENOUS
  Filled 2018-05-22: qty 1

## 2018-05-22 MED ORDER — SACUBITRIL-VALSARTAN 49-51 MG PO TABS
0.5000 | ORAL_TABLET | Freq: Two times a day (BID) | ORAL | Status: DC
  Administered 2018-05-23 – 2018-05-25 (×6): 0.5 via ORAL
  Filled 2018-05-22 (×10): qty 0.5

## 2018-05-22 MED ORDER — SODIUM CHLORIDE 0.9 % IV BOLUS (SEPSIS)
1000.0000 mL | Freq: Once | INTRAVENOUS | Status: AC
  Administered 2018-05-22: 1000 mL via INTRAVENOUS

## 2018-05-22 MED ORDER — IPRATROPIUM-ALBUTEROL 0.5-2.5 (3) MG/3ML IN SOLN
3.0000 mL | Freq: Once | RESPIRATORY_TRACT | Status: AC
  Administered 2018-05-23: 3 mL via RESPIRATORY_TRACT
  Filled 2018-05-22: qty 3

## 2018-05-22 MED ORDER — MAGNESIUM SULFATE 2 GM/50ML IV SOLN
2.0000 g | Freq: Once | INTRAVENOUS | Status: AC
  Administered 2018-05-23: 2 g via INTRAVENOUS
  Filled 2018-05-22: qty 50

## 2018-05-22 MED ORDER — SODIUM CHLORIDE 0.9 % IV SOLN
1.0000 g | INTRAVENOUS | Status: DC
  Administered 2018-05-22 – 2018-05-23 (×2): 1 g via INTRAVENOUS
  Filled 2018-05-22 (×4): qty 1

## 2018-05-22 MED ORDER — VANCOMYCIN HCL 10 G IV SOLR
1500.0000 mg | Freq: Once | INTRAVENOUS | Status: AC
  Administered 2018-05-23: 1500 mg via INTRAVENOUS
  Filled 2018-05-22 (×2): qty 1500

## 2018-05-22 MED ORDER — MIRTAZAPINE 15 MG PO TABS
15.0000 mg | ORAL_TABLET | Freq: Every day | ORAL | Status: DC
  Administered 2018-05-23 – 2018-05-24 (×2): 15 mg via ORAL
  Filled 2018-05-22 (×3): qty 1

## 2018-05-22 MED ORDER — ACETAMINOPHEN 500 MG PO TABS
1000.0000 mg | ORAL_TABLET | Freq: Once | ORAL | Status: AC
  Administered 2018-05-22: 1000 mg via ORAL
  Filled 2018-05-22: qty 2

## 2018-05-22 MED ORDER — VANCOMYCIN HCL IN DEXTROSE 750-5 MG/150ML-% IV SOLN
750.0000 mg | INTRAVENOUS | Status: DC
  Administered 2018-05-24: 750 mg via INTRAVENOUS
  Filled 2018-05-22 (×3): qty 150

## 2018-05-22 NOTE — Progress Notes (Signed)
Pharmacy Antibiotic Note  Mary Cook is a 84 y.o. female admitted on 05/22/2018 with pneumonia.  Pharmacy has been consulted for Vancomycin dosing.  Plan: Vancomycin 1500 mg x 1 dose Vancomycin 750 mg IV every 24 hours.  Goal trough 15-20 mcg/mL.  Cefepime 1000 mg IV every 24 hours Monitor labs, c/s, and vanco trough as indicated  Height: 5\' 5"  (165.1 cm) Weight: 140 lb (63.5 kg) IBW/kg (Calculated) : 57  Temp (24hrs), Avg:100.1 F (37.8 C), Min:98.9 F (37.2 C), Max:101.3 F (38.5 C)  Recent Labs  Lab 05/22/18 2003 05/22/18 2015 05/22/18 2211  WBC 9.1  --   --   CREATININE 1.50*  --   --   LATICACIDVEN  --  0.36* 0.82    Estimated Creatinine Clearance: 22.4 mL/min (A) (by C-G formula based on SCr of 1.5 mg/dL (H)).    Allergies  Allergen Reactions  . Clopidogrel Bisulfate Nausea And Vomiting    Patient is not aware of this allergy  . Lipitor [Atorvastatin] Other (See Comments)    Weak legs  . Namenda [Memantine Hcl]     N/v/d  . Lisinopril Cough    Patient is not aware of this allergy    Antimicrobials this admission: Vanco 9/27 >>  Cefepime 9/27 >>   Dose adjustments this admission: Vanco/Cefepime  Microbiology results: 9/27 BCx: pending 9/27 UCx: pending  9/27 Sputum: pending    Thank you for allowing pharmacy to be a part of this patient's care.  Ramond Craver 05/22/2018 10:55 PM

## 2018-05-22 NOTE — ED Notes (Signed)
Date and time results received: 05/22/18 2056 (use smartphrase ".now" to insert current time)  Test: Troponin Critical Value: 0.03  Name of Provider Notified: Dr Gilford Raid  Orders Received? Or Actions Taken?:

## 2018-05-22 NOTE — Progress Notes (Signed)
Pt admitted to 320 from ED.  Pt oriented to room environment with pt orientation to self only.  Pt placed on high risk fall precautions due to medical record review.  No family present at this time, phoned pt husband with no answer and voicemail box full, unable to leave a message.  Dr Olevia Bowens notified of pt transfer, no admission orders present at this time.  Pt with NS bolus completion with arrival to floor and cefepime infusing via PIV #20 LAC.  Will continue to monitor and await admission orders.

## 2018-05-22 NOTE — ED Triage Notes (Signed)
Pt arrived from [facility] via [facility] for fever, EMS stated non productive cough. Pt states she generally has not been feeling well.

## 2018-05-22 NOTE — ED Notes (Signed)
Pt denies n/v/d, no recent antibiotic tx

## 2018-05-22 NOTE — ED Provider Notes (Signed)
[facility] EMERGENCY DEPARTMENT Provider Note   CSN: [id number] Arrival date & time: 05/22/18  1951     History   Chief Complaint Chief Complaint  Patient presents with  . Fever    HPI Mary Cook is a 84 y.o. female.  Pt presents to the ED today with fever.  She has also had a cough.  No treatment for fever given.  The pt denies any pain.  She said she does not feel well.     Past Medical History:  Diagnosis Date  . CAD (coronary artery disease)    a. 04/2010 MI DES x 2 to LCX;  b. 05/2014 Cath: EF 35%, patent LCX stents w/ 70% stenosis in small AV groove LCX, Diag 50-60, RCA 50-60p->Med Rx.  . Cerebral aneurysm   . Chronic combined systolic (congestive) and diastolic (congestive) heart failure ([place])    a. 01/2015 Echo: EF 30-35%, sev inflat HK, Gr 1DD, mild AI.  Marland Kitchen Diverticulosis of colon   . Essential hypertension   . Frequent urination   . GIB (gastrointestinal bleeding)    a. 2002 Diverticular bleed.  . Hyperlipidemia   . Ischemic cardiomyopathy    a. 01/2015 Echo: EF 30-35%.  . Osteoarthritis   . Osteopenia   . Skin disorder   . Sleep apnea    cpap therapy  . Stroke ([place]) 2002  . T12 compression fracture ([place])    a. 04/2015.  Marland Kitchen Thrombus   . Type II diabetes mellitus ([place])   . Visual disorder     Patient Active Problem List   Diagnosis Date Noted  . Sinus bradycardia 04/01/2018  . Anemia 03/31/2018  . Traumatic subarachnoid hemorrhage with loss of consciousness of 30 minutes or less ([place])   . Anemia of chronic disease 09/06/2016  . Gastroenteritis 07/22/2016  . Hip pain, chronic 06/03/2016  . Dyspnea 11/15/2015  . Acute respiratory failure with hypoxia ([place]) 11/15/2015  . CHF exacerbation ([place]) 11/15/2015  . Pain in the chest   . Chronic systolic CHF (congestive heart failure) ([place])   . Ischemic cardiomyopathy   . T12 compression fracture ([place])   . Intertrigo 02/28/2015  . Actinic keratoses 02/28/2015  . Noncompliance with diet and  medication regimen 02/28/2015  . LBP (low back pain) 02/13/2015  . Acute on chronic renal insufficiency 02/04/2015  . Cardiomyopathy, ischemic-EF 30-35% by echo 01/31/15 02/04/2015  . Chronic kidney disease stage III 02/03/2015  . Acute on chronic combined systolic and diastolic CHF, NYHA class 3 ([place]) 01/30/2015  . Cystitis 10/28/2014  . Insomnia 10/28/2014  . Alzheimer's disease 10/28/2014  . Falls frequently 10/12/2014  . Leg weakness, bilateral 10/12/2014  . OSA on CPAP 09/05/2014  . Rapid palpitations 06/07/2014  . Acute chest pain 06/06/2014  . Chest pain 06/06/2014  . Thoracic back pain after fall  04/13/2013  . Stress at home 01/26/2013  . GERD (gastroesophageal reflux disease) 01/25/2013  . SOB (shortness of breath) 08/28/2011  . Abnormal EKG, marked new ant TWI this adm with negative Troponins,08/18/11 08/20/2011  . CAD S/P CFX PCI Sept 2011 08/18/2011  . Sleep apnea, cpap had been stopped secondary to "sinus issues" 08/18/2011  . [name] deficiency 03/20/2010  . Diabetes mellitus ([place]) 01/07/2008  . Hyperlipidemia 01/07/2008  . Essential hypertension 04/25/2007  . DIVERTICULOSIS, COLON 04/25/2007  . OSTEOARTHRITIS 04/25/2007  . Disorder of bone and cartilage 04/25/2007  . DIVERTICULITIS, HX OF 04/25/2007    Past Surgical History:  Procedure Laterality Date  . ABDOMINAL HYSTERECTOMY  1980   no bso  . APPENDECTOMY    . CARDIAC CATHETERIZATION  07/2011   LAD OK, D1 80%, CFX 30% w/ patent stent, RCA 50-60%, EF 40%  . CARDIAC CATHETERIZATION    . CORONARY ANGIOPLASTY WITH STENT PLACEMENT  05/11/2010   stent CFX  . DOPPLER ECHOCARDIOGRAPHY    . LEFT HEART CATHETERIZATION WITH CORONARY ANGIOGRAM N/A 08/18/2011   Procedure: LEFT HEART CATHETERIZATION WITH CORONARY ANGIOGRAM;  Surgeon: Wellington Hampshire, MD;  Location: [facility] CATH LAB;  Service: Cardiovascular;  Laterality: N/A;  . LEFT HEART CATHETERIZATION WITH CORONARY ANGIOGRAM N/A 06/08/2014   Procedure: LEFT HEART  CATHETERIZATION WITH CORONARY ANGIOGRAM;  Surgeon: Troy Sine, MD;  Location: [facility] CATH LAB;  Service: Cardiovascular;  Laterality: N/A;  . myocardial perfusion study    . THROMBECTOMY    . TONSILLECTOMY       OB History   None      Home Medications    Prior to Admission medications   Medication Sig Start Date End Date Taking? Authorizing Provider  acetaminophen (TYLENOL) 325 MG tablet Take 325 mg by mouth every 12 (twelve) hours as needed for mild pain or headache.    [provider]  aspirin 81 MG EC tablet Take 81 mg by mouth daily.      [provider]  Blood Glucose Monitoring Suppl (ACCU-CHEK AVIVA PLUS) w/Device KIT Use to check blood sugars twice a day Dx. E11.9 09/30/16   Plotnikov, Evie Lacks, MD  carvedilol (COREG) 3.125 MG tablet Take 1 tablet (3.125 mg total) by mouth daily. 04/06/18   Barton Dubois, MD  digoxin (LANOXIN) 0.125 MG tablet Take 0.5 tablets (0.0625 mg total) by mouth daily. 05/11/18   Johnson, Clanford L, MD  docusate sodium (COLACE) 100 MG capsule Take 100 mg by mouth 2 (two) times daily.    [provider]  furosemide (LASIX) 20 MG tablet Take 1 tablet (20 mg total) by mouth every other day. 05/12/18   Johnson, Clanford L, MD  glipiZIDE (GLUCOTROL) 5 MG tablet Take 0.5 tablets (2.5 mg total) by mouth 2 (two) times daily before a meal. 04/06/18 04/06/19  Barton Dubois, MD  isosorbide mononitrate (IMDUR) 30 MG 24 hr tablet Take 0.5 tablets (15 mg total) by mouth daily. 04/06/18   Barton Dubois, MD  mirtazapine (REMERON) 15 MG tablet Take 1 tablet by mouth at bedtime. 05/08/18   [provider]  potassium chloride SA (K-DUR,KLOR-CON) 20 MEQ tablet Take 1 tablet (20 mEq total) by mouth every other day. 05/12/18   Johnson, Clanford L, MD  sacubitril-valsartan (ENTRESTO) 49-51 MG Take 0.5 tablets by mouth 2 (two) times daily. 04/06/18   Barton Dubois, MD    Family History Family History  Problem Relation Age of Onset  .  Hypertension Other   . CAD Unknown        father    Social History Social History   Tobacco Use  . Smoking status: Never Smoker  . Smokeless tobacco: Never Used  Substance Use Topics  . Alcohol use: No  . Drug use: No     Allergies   Clopidogrel bisulfate; Lipitor [atorvastatin]; Namenda [memantine hcl]; and Lisinopril   Review of Systems Review of Systems  Constitutional: Positive for fever.  Respiratory: Positive for cough and shortness of breath.   All other systems reviewed and are negative.    Physical Exam Updated Vital Signs BP (!) 113/48   Pulse (!) 43   Temp 98.9 F (37.2 C) (Oral)  Resp 19   Ht '5\' 5"'$  (1.651 m)   Wt 63.5 kg   SpO2 97%   BMI 23.30 kg/m   Physical Exam  Constitutional: She is oriented to person, place, and time. She appears well-developed and well-nourished.  HENT:  Head: Normocephalic and atraumatic.  Right Ear: External ear normal.  Left Ear: External ear normal.  Nose: Nose normal.  Mouth/Throat: Oropharynx is clear and moist.  Eyes: Pupils are equal, round, and reactive to light. Conjunctivae and EOM are normal.  Neck: Normal range of motion. Neck supple.  Cardiovascular: Normal rate, regular rhythm, normal heart sounds and intact distal pulses.  Pulmonary/Chest: Effort normal. She has rales.  Abdominal: Soft. Bowel sounds are normal.  Musculoskeletal: Normal range of motion.  Neurological: She is alert and oriented to person, place, and time.  Skin: Skin is warm. Capillary refill takes less than 2 seconds.  Psychiatric: She has a normal mood and affect. Her behavior is normal. Judgment and thought content normal.  Nursing note and vitals reviewed.    ED Treatments / Results  Labs (all labs ordered are listed, but only abnormal results are displayed) Labs Reviewed  COMPREHENSIVE METABOLIC PANEL - Abnormal; Notable for the following components:      Result Value   Glucose, Bld 132 (*)    Creatinine, Ser 1.50 (*)     Calcium 8.8 (*)    Albumin 3.0 (*)    GFR calc non Af Amer 29 (*)    GFR calc Af Amer 34 (*)    All other components within normal limits  CBC WITH DIFFERENTIAL/PLATELET - Abnormal; Notable for the following components:   RBC 3.00 (*)    Hemoglobin 9.3 (*)    HCT 28.3 (*)    All other components within normal limits  TROPONIN I - Abnormal; Notable for the following components:   Troponin I 0.03 (*)    All other components within normal limits  I-STAT CG4 LACTIC ACID, ED - Abnormal; Notable for the following components:   Lactic Acid, Venous 0.36 (*)    All other components within normal limits  CULTURE, BLOOD (ROUTINE X 2)  CULTURE, BLOOD (ROUTINE X 2)  URINE CULTURE  URINALYSIS, ROUTINE W REFLEX MICROSCOPIC  INFLUENZA PANEL BY PCR (TYPE A & B)  DIGOXIN LEVEL  I-STAT CG4 LACTIC ACID, ED    EKG EKG Interpretation  Date/Time:  Friday May 22 2018 19:53:03 EDT Ventricular Rate:  92 PR Interval:    QRS Duration: 119 QT Interval:  359 QTC Calculation: 445 R Axis:   -36 Text Interpretation:  Sinus rhythm Multiform ventricular premature complexes Short PR interval Incomplete right bundle branch block LVH with secondary repolarization abnormality Probable anterior infarct, old No significant change since last tracing Confirmed by Isla Pence [phone]) on 05/22/2018 8:43:28 PM   Radiology Dg Chest Portable 1 View  Result Date: 05/22/2018 CLINICAL DATA:  Fever. EXAM: PORTABLE CHEST 1 VIEW COMPARISON:  May 08, 2018 FINDINGS: Stable cardiomegaly. The hila and mediastinum are unchanged. No pneumothorax. No pulmonary nodules or masses. No focal infiltrates. IMPRESSION: No active disease. Electronically Signed   By: Dorise Bullion III M.D   On: 05/22/2018 21:00    Procedures Procedures (including critical care time)  Medications Ordered in ED Medications  sodium chloride 0.9 % bolus 1,000 mL (1,000 mLs Intravenous New Bag/Given 05/22/18 2045)    And  sodium chloride  0.9 % bolus 1,000 mL (has no administration in time range)  acetaminophen (TYLENOL) tablet 1,000 mg (1,000  mg Oral Given 05/22/18 2039)     Initial Impression / Assessment and Plan / ED Course  I have reviewed the triage vital signs and the nursing notes.  Pertinent labs & imaging results that were available during my care of the patient were reviewed by me and considered in my medical decision making (see chart for details).    I suspected pt had pna due to fever and cough, but CXR read as negative.  Pt given IV rocephin and zithromax in case the pna has not shown up yet on cxr.  Pt given sepsis fluids and 1 g tylenol.  The pt's vital signs have improved, but she still is very weak.  She can't even sit up by herself.  I added on a flu test and a dig level.  These are still pending.  Pt d/w Dr. Olevia Bowens (triad) for admission.  Final Clinical Impressions(s) / ED Diagnoses   Final diagnoses:  Fever, unspecified fever cause  Bronchitis  Failure to thrive in adult    ED Discharge Orders    None       Isla Pence, MD 05/22/18 2240

## 2018-05-22 NOTE — ED Notes (Signed)
Pt able to tolerate standing only to bedside commode. O2 sats remained at 95%.

## 2018-05-23 ENCOUNTER — Encounter (HOSPITAL_COMMUNITY): Payer: Self-pay | Admitting: General Practice

## 2018-05-23 ENCOUNTER — Other Ambulatory Visit: Payer: Self-pay

## 2018-05-23 DIAGNOSIS — J189 Pneumonia, unspecified organism: Principal | ICD-10-CM

## 2018-05-23 LAB — GLUCOSE, CAPILLARY
GLUCOSE-CAPILLARY: 53 mg/dL — AB (ref 70–99)
GLUCOSE-CAPILLARY: 115 mg/dL — AB (ref 70–99)
Glucose-Capillary: 144 mg/dL — ABNORMAL HIGH (ref 70–99)
Glucose-Capillary: 191 mg/dL — ABNORMAL HIGH (ref 70–99)
Glucose-Capillary: 76 mg/dL (ref 70–99)

## 2018-05-23 LAB — STREP PNEUMONIAE URINARY ANTIGEN: STREP PNEUMO URINARY ANTIGEN: NEGATIVE

## 2018-05-23 LAB — PHOSPHORUS: Phosphorus: 3 mg/dL (ref 2.5–4.6)

## 2018-05-23 LAB — MAGNESIUM: MAGNESIUM: 2 mg/dL (ref 1.7–2.4)

## 2018-05-23 MED ORDER — ACETAMINOPHEN 325 MG PO TABS: 650.0000 mg | ORAL_TABLET | Freq: Four times a day (QID) | ORAL | Status: DC | PRN

## 2018-05-23 MED ORDER — DEXTROSE 50 % IV SOLN
INTRAVENOUS | Status: AC
  Administered 2018-05-23: 50 mL via INTRAVENOUS
  Filled 2018-05-23: qty 50

## 2018-05-23 MED ORDER — ALBUTEROL SULFATE (2.5 MG/3ML) 0.083% IN NEBU: 1.2500 mg | INHALATION_SOLUTION | Freq: Four times a day (QID) | RESPIRATORY_TRACT | Status: DC | PRN

## 2018-05-23 MED ORDER — ONDANSETRON HCL 4 MG/2ML IJ SOLN: 4.0000 mg | Freq: Four times a day (QID) | INTRAMUSCULAR | Status: DC | PRN

## 2018-05-23 MED ORDER — IPRATROPIUM BROMIDE 0.02 % IN SOLN
0.5000 mg | Freq: Four times a day (QID) | RESPIRATORY_TRACT | Status: DC
  Administered 2018-05-23 (×3): 0.5 mg via RESPIRATORY_TRACT
  Filled 2018-05-23 (×3): qty 2.5

## 2018-05-23 MED ORDER — IPRATROPIUM BROMIDE 0.02 % IN SOLN
0.5000 mg | Freq: Three times a day (TID) | RESPIRATORY_TRACT | Status: DC
  Administered 2018-05-24: 0.5 mg via RESPIRATORY_TRACT
  Filled 2018-05-23: qty 2.5

## 2018-05-23 MED ORDER — POTASSIUM CHLORIDE CRYS ER 20 MEQ PO TBCR
20.0000 meq | EXTENDED_RELEASE_TABLET | ORAL | Status: DC
  Administered 2018-05-24: 20 meq via ORAL
  Filled 2018-05-23 (×2): qty 1

## 2018-05-23 MED ORDER — ISOSORBIDE MONONITRATE ER 30 MG PO TB24
15.0000 mg | ORAL_TABLET | Freq: Every day | ORAL | Status: DC
  Administered 2018-05-23 – 2018-05-25 (×3): 15 mg via ORAL
  Filled 2018-05-23 (×4): qty 1

## 2018-05-23 MED ORDER — DIGOXIN 125 MCG PO TABS
0.0625 mg | ORAL_TABLET | Freq: Every day | ORAL | Status: DC
  Administered 2018-05-23 – 2018-05-25 (×3): 0.0625 mg via ORAL
  Filled 2018-05-23 (×4): qty 1

## 2018-05-23 MED ORDER — HEPARIN SODIUM (PORCINE) 5000 UNIT/ML IJ SOLN
5000.0000 [IU] | Freq: Three times a day (TID) | INTRAMUSCULAR | Status: DC
  Administered 2018-05-23 – 2018-05-25 (×8): 5000 [IU] via SUBCUTANEOUS
  Filled 2018-05-23 (×8): qty 1

## 2018-05-23 MED ORDER — ASPIRIN EC 81 MG PO TBEC
81.0000 mg | DELAYED_RELEASE_TABLET | Freq: Every day | ORAL | Status: DC
  Administered 2018-05-23 – 2018-05-25 (×3): 81 mg via ORAL
  Filled 2018-05-23 (×7): qty 1

## 2018-05-23 MED ORDER — FUROSEMIDE 20 MG PO TABS
20.0000 mg | ORAL_TABLET | ORAL | Status: DC
  Administered 2018-05-24: 20 mg via ORAL
  Filled 2018-05-23 (×3): qty 1

## 2018-05-23 MED ORDER — ONDANSETRON HCL 4 MG PO TABS: 4.0000 mg | ORAL_TABLET | Freq: Four times a day (QID) | ORAL | Status: DC | PRN

## 2018-05-23 MED ORDER — ENSURE ENLIVE PO LIQD
237.0000 mL | Freq: Two times a day (BID) | ORAL | Status: DC
  Administered 2018-05-23 – 2018-05-24 (×2): 237 mL via ORAL

## 2018-05-23 MED ORDER — CARVEDILOL 3.125 MG PO TABS
3.1250 mg | ORAL_TABLET | Freq: Every day | ORAL | Status: DC
  Administered 2018-05-23 – 2018-05-25 (×3): 3.125 mg via ORAL
  Filled 2018-05-23 (×4): qty 1

## 2018-05-23 MED ORDER — FUROSEMIDE 20 MG PO TABS: 20.0000 mg | ORAL_TABLET | ORAL | Status: DC

## 2018-05-23 MED ORDER — ACETAMINOPHEN 650 MG RE SUPP: 650.0000 mg | Freq: Four times a day (QID) | RECTAL | Status: DC | PRN

## 2018-05-23 MED ORDER — SODIUM CHLORIDE 0.9 % IV SOLN
INTRAVENOUS | Status: DC | PRN
  Administered 2018-05-23: 1000 mL via INTRAVENOUS

## 2018-05-23 MED ORDER — GLIPIZIDE 5 MG PO TABS
2.5000 mg | ORAL_TABLET | Freq: Two times a day (BID) | ORAL | Status: DC
  Administered 2018-05-23 – 2018-05-24 (×3): 2.5 mg via ORAL
  Filled 2018-05-23 (×3): qty 1

## 2018-05-23 MED ORDER — DEXTROSE 50 % IV SOLN
1.0000 | Freq: Once | INTRAVENOUS | Status: AC
  Administered 2018-05-23: 50 mL via INTRAVENOUS

## 2018-05-23 NOTE — Progress Notes (Signed)
Patient admitted to the hospital earlier this morning by Dr. Olevia Bowens.  Patient seen and examined.  She is feeling better.  Denies any shortness of breath.  She does have a cough.  Chest exam shows coarse breath sounds bilaterally.  No pedal edema  Patient was admitted to the hospital with fever and cough.  Found to have evidence of healthcare associated pneumonia.  Started on intravenous antibiotics.  The remainder of her medical problems remained stable.  Continue treatments for at least another 24 hours.  If she does not have any further fevers and continues to improve, anticipate de-escalating antibiotics tomorrow.  Mary Cook

## 2018-05-23 NOTE — Progress Notes (Signed)
Initial Nutrition Assessment  DOCUMENTATION CODES:  Not applicable  INTERVENTION:  Continue Ensure Enlive po BID, each supplement provides 350 kcal and 20 grams of protein  Monitor PO intake and will follow up as warranted  NUTRITION DIAGNOSIS:  Increased nutrient needs related to acute illness(HCAP) as evidenced by estimated nutritional requirements for this condition  GOAL:  Patient will meet greater than or equal to 90% of their needs  MONITOR:  PO intake, Supplement acceptance, Diet advancement, Labs, Weight trends  REASON FOR ASSESSMENT:  Malnutrition Screening Tool    ASSESSMENT:  84 y/o female PMHx Dementia, CAD, CHF, HTN, HLD, CVA, ckd3, dm2. Had been hospitalized 8/6-8/15 for dehydration and 9/13-9/16 for UTI & CHF exacerbation. She represents w/ progressively worsening cough x1 week and fever x1 day. Clinical picture consistent w/ HCAP. Admitted for management.   RD operating remotely. Patient had been admitted to the hospital 2 weeks ago. Per review of that encounter, she never ate more than 25% of a documented meal. She also was hospitalized 8/6-8/15. Per review of that encounter, patient ate extremely well the second part of the admission, 50-100%  Patient denied any n/v/d on presentation to the ED. Apparently patient has been so weak she could hardly sit herself up.   Weight wise, the patient was 140-145 lbs at this time last year. She appears to have lost a sizeable amount of weight over there winter, as she was 122 lbs 13 oz at her PCP visit 5/24. She actually had that encounter DUE to the wt loss. Apparently the patient had been living on her own at that time. Noted she is now at an ALF. Question if change in living environment has  Worsened patient dementia and intake. Patient was 118 lbs 10 oz when presented in August for dehydration.  She was just discharged 9/16 after diuresed at 115 lbs. She is now 127.56 lbs-suspect she has some edema.   At this time, there is  no documented patient intake. Continue oral supplements and monitor PO intake.   Labs: BUN/creat: 20/1.5, Albumin:3.0, H/h:9.3/28.3. BGs 130-145 Meds: Glipizide, Ensure BID, KCL, Remeron, Lasix, ivf, iv abx   Recent Labs  Lab 05/22/18 2003  NA 136  K 3.8  CL 106  CO2 24  BUN 20  CREATININE 1.50*  CALCIUM 8.8*  MG 2.0  PHOS 3.0  GLUCOSE 132*   NUTRITION - FOCUSED PHYSICAL EXAM: Unable to assess  Diet Order:   Diet Order            Diet heart healthy/carb modified Room service appropriate? Yes; Fluid consistency: Thin  Diet effective now             EDUCATION NEEDS:  None identified at this time  Skin:  Skin Assessment: Reviewed RN Assessment  Last BM:  Unknown  Height:  Ht Readings from Last 1 Encounters:  05/22/18 5\' 5"  (1.651 m)   Weight:  Wt Readings from Last 1 Encounters:  05/22/18 56.5 kg   Wt Readings from Last 10 Encounters:  05/22/18 56.5 kg  05/11/18 52.3 kg  04/09/18 53.9 kg  01/16/18 55.7 kg  06/05/17 63.5 kg  05/21/17 65.8 kg  03/18/17 64.4 kg  01/22/17 69.4 kg  01/10/17 68.5 kg  12/12/16 69.9 kg   Ideal Body Weight:  56.82 kg  BMI:  Body mass index is 20.73 kg/m.  Recent dry weight 115 lbs Estimated Nutritional Needs:  Kcal:  1550-1750 kcals (30-33 kcal/kg dry weight) Protein:  73-83g Pro (1.4-1.6 g/kg dry weight)  Fluid:  Per MD fluid goals   Burtis Junes RD, LDN, CNSC Clinical Nutrition Available Tues-Sat via Pager: [phone] 05/23/2018 10:31 AM

## 2018-05-23 NOTE — H&P (Signed)
History and Physical    Mary Cook HCW:[id number] DOB: 01/25/1926 DOA: 05/22/2018  PCP: Cassandria Anger, MD   Patient coming from: [facility].  I have personally briefly reviewed patient's old medical records in [place]  Chief Complaint: Fever and cough.  HPI: Mary Cook is a 84 y.o. female with medical history significant of CAD, chronic combined systolic and diastolic heart failure, history of cerebral aneurysm, diverticulosis, essential hypertension, frequent urination, history of diverticular bleed, hyperlipidemia, osteoarthritis, osteopenia, unspecified skin disorder, sleep apnea no longer on CPAP, history of CVA, history of T12 compression fracture, type 2 diabetes who is brought to the emergency department due to fever earlier today and progressively worse cough since Saturday.  The patient describes his cough as wet, but states that she is unable to expectorate it.  She denies having fever or night sweats prior to today to her knowledge, but complains of fatigue, dyspnea, wheezing and sore throat earlier in the week.  No sick contacts to her knowledge, however the patient has been admitted to the hospital twice in less than 2 months.  Most recently due to acute on chronic combined CHF and UTI.  She denies chest pain, palpitations, dizziness, diaphoresis, post discharge lower extremities edema, PND or orthopnea.  Denies abdominal pain, nausea, emesis, diarrhea, melena or hematochezia.  No dysuria, frequency or hematuria.  No polyuria, polydipsia, polyphagia or blurred vision.  ED Course: Initial vital signs temperature 101.3 F, pulse 91, respirations 18, blood pressure 167/75 mmHg and O2 sat 96% on room air.  The patient was started on IV antibiotics and was given 2 L of NS bolus in the emergency department.  Her urinalysis was normal.  White count was 9.1 with 78% neutrophils, 11% lymphocytes and 9% monocytes.  Hemoglobin 9.3 g/dL and  platelets 225.  Lactic acid was low or normal x2.  CMP shows a glucose of 132, creatinine of 1.5 and calcium of 8.8 mg/dL.  However, albumin is 3.0 g/dL so calcium level is normal when corrected.  All other CMP values are normal.  Troponin was 0.03 and digoxin 0.4 ng/mL.  Her EKG shows sinus rhythm with multiple PVCs, incomplete RBBB, LVH per electrocardiographic criteria, but no significant or new changes.  Her chest radiograph did not show any acute cardiopulmonary pathology.  Review of Systems: As per HPI otherwise 10 point review of systems negative.   Past Medical History:  Diagnosis Date  . CAD (coronary artery disease)    a. 04/2010 MI DES x 2 to LCX;  b. 05/2014 Cath: EF 35%, patent LCX stents w/ 70% stenosis in small AV groove LCX, Diag 50-60, RCA 50-60p->Med Rx.  . Cerebral aneurysm   . Chronic combined systolic (congestive) and diastolic (congestive) heart failure ([place])    a. 01/2015 Echo: EF 30-35%, sev inflat HK, Gr 1DD, mild AI.  Marland Kitchen Diverticulosis of colon   . Essential hypertension   . Frequent urination   . GIB (gastrointestinal bleeding)    a. 2002 Diverticular bleed.  . Hyperlipidemia   . Ischemic cardiomyopathy    a. 01/2015 Echo: EF 30-35%.  . Osteoarthritis   . Osteopenia   . Skin disorder   . Sleep apnea    cpap therapy  . Stroke ([place]) 2002  . T12 compression fracture ([place])    a. 04/2015.  Marland Kitchen Thrombus   . Type II diabetes mellitus ([place])   . Visual disorder     Past Surgical History:  Procedure Laterality Date  .  ABDOMINAL HYSTERECTOMY  1980   no bso  . APPENDECTOMY    . CARDIAC CATHETERIZATION  07/2011   LAD OK, D1 80%, CFX 30% w/ patent stent, RCA 50-60%, EF 40%  . CARDIAC CATHETERIZATION    . CORONARY ANGIOPLASTY WITH STENT PLACEMENT  05/11/2010   stent CFX  . DOPPLER ECHOCARDIOGRAPHY    . LEFT HEART CATHETERIZATION WITH CORONARY ANGIOGRAM N/A 08/18/2011   Procedure: LEFT HEART CATHETERIZATION WITH CORONARY ANGIOGRAM;  Surgeon: Wellington Hampshire, MD;   Location: [facility] CATH LAB;  Service: Cardiovascular;  Laterality: N/A;  . LEFT HEART CATHETERIZATION WITH CORONARY ANGIOGRAM N/A 06/08/2014   Procedure: LEFT HEART CATHETERIZATION WITH CORONARY ANGIOGRAM;  Surgeon: Troy Sine, MD;  Location: [facility] CATH LAB;  Service: Cardiovascular;  Laterality: N/A;  . myocardial perfusion study    . THROMBECTOMY    . TONSILLECTOMY       reports that she has never smoked. She has never used smokeless tobacco. She reports that she does not drink alcohol or use drugs.  Allergies  Allergen Reactions  . Clopidogrel Bisulfate Nausea And Vomiting    Patient is not aware of this allergy  . Lipitor [Atorvastatin] Other (See Comments)    Weak legs  . Namenda [Memantine Hcl]     N/v/d  . Lisinopril Cough    Patient is not aware of this allergy    Family History  Problem Relation Age of Onset  . Hypertension Other   . CAD Unknown        father    Prior to Admission medications   Medication Sig Start Date End Date Taking? Authorizing Provider  acetaminophen (TYLENOL) 325 MG tablet Take 325 mg by mouth every 12 (twelve) hours as needed for mild pain or headache.   Yes [provider]  aspirin 81 MG EC tablet Take 81 mg by mouth daily.     Yes [provider]  Blood Glucose Monitoring Suppl (ACCU-CHEK AVIVA PLUS) w/Device KIT Use to check blood sugars twice a day Dx. E11.9 09/30/16  Yes Plotnikov, Evie Lacks, MD  carvedilol (COREG) 3.125 MG tablet Take 1 tablet (3.125 mg total) by mouth daily. 04/06/18  Yes Barton Dubois, MD  digoxin (LANOXIN) 0.125 MG tablet Take 0.5 tablets (0.0625 mg total) by mouth daily. 05/11/18  Yes Johnson, Clanford L, MD  furosemide (LASIX) 20 MG tablet Take 1 tablet (20 mg total) by mouth every other day. 05/12/18  Yes Johnson, Clanford L, MD  glipiZIDE (GLUCOTROL) 5 MG tablet Take 0.5 tablets (2.5 mg total) by mouth 2 (two) times daily before a meal. 04/06/18 04/06/19 Yes Barton Dubois, MD  isosorbide mononitrate (IMDUR)  30 MG 24 hr tablet Take 0.5 tablets (15 mg total) by mouth daily. 04/06/18  Yes Barton Dubois, MD  mirtazapine (REMERON) 15 MG tablet Take 1 tablet by mouth at bedtime. 05/08/18  Yes [provider]  potassium chloride SA (K-DUR,KLOR-CON) 20 MEQ tablet Take 1 tablet (20 mEq total) by mouth every other day. 05/12/18  Yes Johnson, Clanford L, MD  sacubitril-valsartan (ENTRESTO) 49-51 MG Take 0.5 tablets by mouth 2 (two) times daily. 04/06/18  Yes Barton Dubois, MD    Physical Exam: Vitals:   05/22/18 2214 05/22/18 2230 05/22/18 2300 05/22/18 2352  BP:  (!) 113/48 (!) 122/47 (!) 129/108  Pulse:  (!) 43 73 70  Resp:  _0 Temp: 98.9 F (37.2 C)   98.5 F (36.9 C)  TempSrc: Oral   Oral  SpO2:  97% 98%  100%  Weight:    56.5 kg  Height:    _0  (1.651 m)    Constitutional: Elderly, frail, looks acutely ill. Eyes: PERRL, lids and conjunctivae normal ENMT: Mucous membranes are mildly dry. Posterior pharynx clear of any exudate or lesions. Neck: normal, supple, no masses, no thyromegaly Respiratory: Decreased breath sounds with wheezing and rhonchi bilaterally. Normal respiratory effort. No accessory muscle use.  Cardiovascular: Regular rate and rhythm, no murmurs / rubs / gallops. No extremity edema. 2+ pedal pulses. No carotid bruits.  Abdomen: Soft, no tenderness, no masses palpated. No hepatosplenomegaly. Bowel sounds positive.  Musculoskeletal: no clubbing / cyanosis. Good ROM, no contractures. Normal muscle tone.  Skin: Multiple areas of ecchymosis, particularly on upper extremities. Neurologic: CN 2-12 grossly intact. Sensation intact, DTR normal. Strength 5/5 in all 4.  Psychiatric: Normal judgment and insight. Alert and oriented x 2, partially oriented to time. Normal mood.   Labs on Admission: I have personally reviewed following labs and imaging studies  CBC: Recent Labs  Lab 05/22/18 2003  WBC 9.1  NEUTROABS 7.1  HGB 9.3*  HCT 28.3*  MCV 94.3  PLT 502    Basic Metabolic Panel: Recent Labs  Lab 05/22/18 2003  NA 136  K 3.8  CL 106  CO2 24  GLUCOSE 132*  BUN 20  CREATININE 1.50*  CALCIUM 8.8*  MG 2.0  PHOS 3.0   GFR: Estimated Creatinine Clearance: 22.2 mL/min (A) (by C-G formula based on SCr of 1.5 mg/dL (H)). Liver Function Tests: Recent Labs  Lab 05/22/18 2003  AST 15  ALT 11  ALKPHOS 59  BILITOT 0.6  PROT 6.5  ALBUMIN 3.0*   No results for input(s): LIPASE, AMYLASE in the last 168 hours. No results for input(s): AMMONIA in the last 168 hours. Coagulation Profile: No results for input(s): INR, PROTIME in the last 168 hours. Cardiac Enzymes: Recent Labs  Lab 05/22/18 2003  TROPONINI 0.03*   BNP (last 3 results) No results for input(s): PROBNP in the last 8760 hours. HbA1C: No results for input(s): HGBA1C in the last 72 hours. CBG: No results for input(s): GLUCAP in the last 168 hours. Lipid Profile: No results for input(s): CHOL, HDL, LDLCALC, TRIG, CHOLHDL, LDLDIRECT in the last 72 hours. Thyroid Function Tests: No results for input(s): TSH, T4TOTAL, FREET4, T3FREE, THYROIDAB in the last 72 hours. Anemia Panel: No results for input(s): VITAMINB12, FOLATE, FERRITIN, TIBC, IRON, RETICCTPCT in the last 72 hours. Urine analysis:    Component Value Date/Time   COLORURINE YELLOW 05/22/2018 2051   APPEARANCEUR CLEAR 05/22/2018 2051   LABSPEC 1.013 05/22/2018 2051   PHURINE 6.0 05/22/2018 2051   GLUCOSEU NEGATIVE 05/22/2018 2051   GLUCOSEU NEGATIVE 10/12/2014 1053   HGBUR NEGATIVE 05/22/2018 2051   [place] NEGATIVE 05/22/2018 2051   [place] NEGATIVE 05/22/2018 2051   PROTEINUR NEGATIVE 05/22/2018 2051   UROBILINOGEN 1.0 04/28/2015 1658   NITRITE NEGATIVE 05/22/2018 2051   LEUKOCYTESUR NEGATIVE 05/22/2018 2051    Radiological Exams on Admission: Dg Chest Portable 1 View  Result Date: 05/22/2018 CLINICAL DATA:  Fever. EXAM: PORTABLE CHEST 1 VIEW COMPARISON:  May 08, 2018 FINDINGS: Stable  cardiomegaly. The hila and mediastinum are unchanged. No pneumothorax. No pulmonary nodules or masses. No focal infiltrates. IMPRESSION: No active disease. Electronically Signed   By: Dorise Bullion III M.D   On: 05/22/2018 21:00    EKG: Independently reviewed.  Vent. rate 92 BPM PR interval * ms QRS duration 119 ms QT/QTc 359/445 ms P-R-T axes  0 -36 152 Sinus rhythm Multiform ventricular premature complexes Short PR interval Incomplete right bundle branch block LVH with secondary repolarization abnormality Probable anterior infarct, old No significant changes when compared to old cardiogram.  Assessment/Plan Principal Problem:   HCAP (healthcare-associated pneumonia) The patient has a clinical picture of pneumonia in the setting of sepsis and normal UA. Noticed to be hypoxic in the low 90s when talking and on [place] oxygen. Admit to telemetry/inpatient. Continue supplemental oxygen. Bronchodilators as needed. Cefepime 1 g IVPB every 8 hours.   Vancomycin per pharmacy. Follow-up blood cultures and sensitivity. Check sputum Gram stain, culture and sensitivity. Check strep pneumoniae urinary antigen.  Active Problems:   Diabetes mellitus (HCC)  Carb modified diet. Hold metformin due to AKI. CBG monitoring with sensitive RI sliding scale.    Hyperlipidemia Not on medical therapy.    Essential hypertension Hold Entresto and carvedilol. Monitor blood pressure, heart rate, renal function electrolytes.    GERD (gastroesophageal reflux disease) Protonix 40 mg p.o. daily.    OSA on CPAP No longer on CPAP at bedtime.    Alzheimer's disease Continue supportive care.    Chronic systolic CHF (congestive heart failure) (HCC) No signs of decompensation at this time. Continue Entresto. Continue carvedilol and digoxin.    Chronic kidney disease stage III Monitor renal function electrolytes.    Anemia of chronic disease Monitor hematocrit and hemoglobin.    Elevated  troponin Trend troponin level.     DVT prophylaxis: Lovenox SQ. Code Status: DNR. Family Communication: None at bedside. Disposition Plan: Admit for HCAP antibiotic therapy for several days. Consults called: Admission status: Inpatient/telemetry.   Reubin Milan MD [facility] Pager [phone].  If 7PM-7AM, please contact night-coverage [email or web address] Password [email or web address]  05/23/2018, 1:30 AM

## 2018-05-23 NOTE — Progress Notes (Signed)
PHARMACY NOTE:  ANTIMICROBIAL RENAL DOSAGE ADJUSTMENT  Current antimicrobial regimen includes a mismatch between antimicrobial dosage and estimated renal function.  As per policy approved by the Pharmacy & Therapeutics and Medical Executive Committees, the antimicrobial dosage will be adjusted accordingly.  Current antimicrobial dosage:  Cefepime 1 gram Q8 hours  Indication: PNA  Renal Function:  Estimated Creatinine Clearance: 22.2 mL/min (A) (by C-G formula based on SCr of 1.5 mg/dL (H)). []      On intermittent HD, scheduled: []      On CRRT    Antimicrobial dosage has been changed to:  Cefepime 1 gram Q24 hours  Additional comments:   Thank you for allowing pharmacy to be a part of this patient's care.  Nyra Capes, [facility] 05/23/2018 1:30 AM

## 2018-05-24 DIAGNOSIS — D638 Anemia in other chronic diseases classified elsewhere: Secondary | ICD-10-CM

## 2018-05-24 DIAGNOSIS — N183 Chronic kidney disease, stage 3 (moderate): Secondary | ICD-10-CM

## 2018-05-24 DIAGNOSIS — F0281 Dementia in other diseases classified elsewhere with behavioral disturbance: Secondary | ICD-10-CM

## 2018-05-24 DIAGNOSIS — I5022 Chronic systolic (congestive) heart failure: Secondary | ICD-10-CM

## 2018-05-24 DIAGNOSIS — G301 Alzheimer's disease with late onset: Secondary | ICD-10-CM

## 2018-05-24 DIAGNOSIS — I10 Essential (primary) hypertension: Secondary | ICD-10-CM

## 2018-05-24 LAB — URINE CULTURE

## 2018-05-24 LAB — BASIC METABOLIC PANEL
Anion gap: 9 (ref 5–15)
BUN: 24 mg/dL — ABNORMAL HIGH (ref 8–23)
CALCIUM: 8.7 mg/dL — AB (ref 8.9–10.3)
CO2: 23 mmol/L (ref 22–32)
CREATININE: 1.45 mg/dL — AB (ref 0.44–1.00)
Chloride: 111 mmol/L (ref 98–111)
GFR calc non Af Amer: 31 mL/min — ABNORMAL LOW (ref >60)
GFR, EST AFRICAN AMERICAN: 36 mL/min — AB (ref >60)
Glucose, Bld: 89 mg/dL (ref 70–99)
Potassium: 3.2 mmol/L — ABNORMAL LOW (ref 3.5–5.1)
SODIUM: 143 mmol/L (ref 135–145)

## 2018-05-24 LAB — CBC
HCT: 28.7 % — ABNORMAL LOW (ref 36.0–46.0)
Hemoglobin: 9.1 g/dL — ABNORMAL LOW (ref 12.0–15.0)
MCH: 30.5 pg (ref 26.0–34.0)
MCHC: 31.7 g/dL (ref 30.0–36.0)
MCV: 96.3 fL (ref 78.0–100.0)
PLATELETS: 228 10*3/uL (ref 150–400)
RBC: 2.98 MIL/uL — AB (ref 3.87–5.11)
RDW: 12.4 % (ref 11.5–15.5)
WBC: 8 10*3/uL (ref 4.0–10.5)

## 2018-05-24 LAB — GLUCOSE, CAPILLARY
GLUCOSE-CAPILLARY: 79 mg/dL (ref 70–99)
Glucose-Capillary: 100 mg/dL — ABNORMAL HIGH (ref 70–99)
Glucose-Capillary: 111 mg/dL — ABNORMAL HIGH (ref 70–99)
Glucose-Capillary: 119 mg/dL — ABNORMAL HIGH (ref 70–99)
Glucose-Capillary: 93 mg/dL (ref 70–99)

## 2018-05-24 MED ORDER — IPRATROPIUM-ALBUTEROL 0.5-2.5 (3) MG/3ML IN SOLN: 3.0000 mL | Freq: Four times a day (QID) | RESPIRATORY_TRACT | Status: DC | PRN

## 2018-05-24 MED ORDER — INSULIN ASPART 100 UNIT/ML ~~LOC~~ SOLN: 0.0000 [IU] | Freq: Three times a day (TID) | SUBCUTANEOUS | Status: DC

## 2018-05-24 MED ORDER — AMOXICILLIN-POT CLAVULANATE 875-125 MG PO TABS
1.0000 | ORAL_TABLET | Freq: Two times a day (BID) | ORAL | Status: DC
  Administered 2018-05-24 – 2018-05-25 (×2): 1 via ORAL
  Filled 2018-05-24 (×3): qty 1

## 2018-05-24 MED ORDER — POTASSIUM CHLORIDE CRYS ER 20 MEQ PO TBCR
40.0000 meq | EXTENDED_RELEASE_TABLET | Freq: Once | ORAL | Status: AC
  Administered 2018-05-24: 40 meq via ORAL
  Filled 2018-05-24: qty 2

## 2018-05-24 NOTE — Progress Notes (Signed)
PT refused IV. Contacted physician for midlevel, ok for refusal at this time. Continue to monitor.

## 2018-05-24 NOTE — Progress Notes (Signed)
PROGRESS NOTE    Mary Cook  NID:[id number] DOB: [birth year] DOA: 05/22/2018 PCP: Cassandria Anger, MD    Brief Narrative:  84 year old female who is a resident of an assisted living facility, admitted to the hospital with fever and cough.  Found to have healthcare associated pneumonia.  Treated with intravenous antibiotics.  Overall fevers have improved.  Respiratory status appears to be stabilizing.  Seen by physical therapy recommended discharge to skilled nursing facility.  Will request social work to assist with bed placement.   Assessment & Plan:   Principal Problem:   HCAP (healthcare-associated pneumonia) Active Problems:   Hyperlipidemia   Essential hypertension   Sleep apnea, cpap had been stopped secondary to "sinus issues"   GERD (gastroesophageal reflux disease)   Alzheimer's disease   Chronic kidney disease stage III   Chronic systolic CHF (congestive heart failure) (HCC)   Anemia of chronic disease   Elevated troponin   1. Healthcare associated pneumonia.  Patient presented with hypoxia, cough and fever.  Found to have evidence of pneumonia on admission.  Started on vancomycin and cefepime.  Overall fevers have resolved.  Respiratory status appears to be stabilizing.  Will de-escalate antibiotics to Augmentin.  Continue pulmonary hygiene. 2. Diabetes.  Continue on sliding scale insulin.  Blood sugar stable.  Holding metformin. 3. Chronic systolic congestive heart failure.  Appears to be compensated.  Continue on Entresto, carvedilol, Imdur and digoxin 4. Chronic kidney disease stage III.  Creatinine is currently at baseline.  Continue to monitor. 5. Anemia of chronic disease.  Likely related to renal disease.  Hemoglobin is currently stable.  Continue to monitor. 6. Obstructive sleep apnea.  She does not use CPAP at bedtime. 7. GERD.  Continue on Protonix   DVT prophylaxis: Heparin Code Status: DNR Family Communication: No family present Disposition  Plan: Seen by physical therapy and a skilled nursing facility placement   Consultants:     Procedures:     Antimicrobials:   Vancomycin 9/27 > 9/29  Cefepime 9/27 > 9/29  Augmentin 9/29 >   Subjective: Feeling better today.  Shortness of breath improving.  Continues to have a cough.  Objective: Vitals:   05/23/18 2151 05/24/18 0534 05/24/18 1048 05/24/18 1311  BP: (!) 143/67 (!) 150/72  (!) 126/54  Pulse: 76 81  71  Resp: 16 16  16   Temp: 98.4 F (36.9 C) 98.4 F (36.9 C)  98.9 F (37.2 C)  TempSrc: Oral Oral  Oral  SpO2: 98% 96% 98% 95%  Weight:      Height:        Intake/Output Summary (Last 24 hours) at 05/24/2018 1730 Last data filed at 05/24/2018 1220 Gross per 24 hour  Intake 702.32 ml  Output 550 ml  Net 152.32 ml   Filed Weights   05/22/18 1948 05/22/18 2352  Weight: 63.5 kg 56.5 kg    Examination:  General exam: Appears calm and comfortable  Respiratory system: bilateral rhonchi. Respiratory effort normal. Cardiovascular system: S1 & S2 heard, RRR. No JVD, murmurs, rubs, gallops or clicks. No pedal edema. Gastrointestinal system: Abdomen is nondistended, soft and nontender. No organomegaly or masses felt. Normal bowel sounds heard. Central nervous system: No focal neurological deficits. Extremities: Symmetric 5 x 5 power. Skin: No rashes, lesions or ulcers Psychiatry: pleasant, cooperates with exam, engages in conversation.     Data Reviewed: I have personally reviewed following labs and imaging studies  CBC: Recent Labs  Lab 05/22/18 2003 05/24/18 0613  WBC 9.1  8.0  NEUTROABS 7.1  --   HGB 9.3* 9.1*  HCT 28.3* 28.7*  MCV 94.3 96.3  PLT 225 182   Basic Metabolic Panel: Recent Labs  Lab 05/22/18 2003 05/24/18 0613  NA 136 143  K 3.8 3.2*  CL 106 111  CO2 24 23  GLUCOSE 132* 89  BUN 20 24*  CREATININE 1.50* 1.45*  CALCIUM 8.8* 8.7*  MG 2.0  --   PHOS 3.0  --    GFR: Estimated Creatinine Clearance: 23 mL/min (A) (by  C-G formula based on SCr of 1.45 mg/dL (H)). Liver Function Tests: Recent Labs  Lab 05/22/18 2003  AST 15  ALT 11  ALKPHOS 59  BILITOT 0.6  PROT 6.5  ALBUMIN 3.0*   No results for input(s): LIPASE, AMYLASE in the last 168 hours. No results for input(s): AMMONIA in the last 168 hours. Coagulation Profile: No results for input(s): INR, PROTIME in the last 168 hours. Cardiac Enzymes: Recent Labs  Lab 05/22/18 2003  TROPONINI 0.03*   BNP (last 3 results) No results for input(s): PROBNP in the last 8760 hours. HbA1C: No results for input(s): HGBA1C in the last 72 hours. CBG: Recent Labs  Lab 05/23/18 2252 05/24/18 0104 05/24/18 0737 05/24/18 1134 05/24/18 1629  GLUCAP 115* 100* 79 111* 119*   Lipid Profile: No results for input(s): CHOL, HDL, LDLCALC, TRIG, CHOLHDL, LDLDIRECT in the last 72 hours. Thyroid Function Tests: No results for input(s): TSH, T4TOTAL, FREET4, T3FREE, THYROIDAB in the last 72 hours. Anemia Panel: No results for input(s): VITAMINB12, FOLATE, FERRITIN, TIBC, IRON, RETICCTPCT in the last 72 hours. Sepsis Labs: Recent Labs  Lab 05/22/18 2015 05/22/18 2211  LATICACIDVEN 0.36* 0.82    Recent Results (from the past 240 hour(s))  Blood Culture (routine x 2)     Status: None (Preliminary result)   Collection Time: 05/22/18  8:31 PM  Result Value Ref Range Status   Specimen Description RIGHT ANTECUBITAL  Final   Special Requests   Final    BOTTLES DRAWN AEROBIC AND ANAEROBIC Blood Culture results may not be optimal due to an inadequate volume of blood received in culture bottles   Culture   Final    NO GROWTH 2 DAYS Performed at [facility], [address] [place], [place] [address]    Report Status PENDING  Incomplete  Blood Culture (routine x 2)     Status: None (Preliminary result)   Collection Time: 05/22/18  8:32 PM  Result Value Ref Range Status   Specimen Description BLOOD LEFT HAND  Final   Special Requests   Final    BOTTLES DRAWN  AEROBIC ONLY Blood Culture adequate volume   Culture   Final    NO GROWTH 2 DAYS Performed at [facility], [address], [place], [place] [address]    Report Status PENDING  Incomplete  Urine culture     Status: None   Collection Time: 05/22/18  8:51 PM  Result Value Ref Range Status   Specimen Description   Final    URINE, CLEAN CATCH Performed at [facility], [address], [place], [place] [address]    Special Requests   Final    NONE Performed at [facility], [address], [place], [place] [address]    Culture   Final    Multiple bacterial morphotypes present, none predominant. Suggest appropriate recollection if clinically indicated.   Report Status 05/24/2018 FINAL  Final         Radiology Studies: Dg  Chest Portable 1 View  Result Date: 05/22/2018 CLINICAL DATA:  Fever. EXAM: PORTABLE CHEST 1 VIEW COMPARISON:  May 08, 2018 FINDINGS: Stable cardiomegaly. The hila and mediastinum are unchanged. No pneumothorax. No pulmonary nodules or masses. No focal infiltrates. IMPRESSION: No active disease. Electronically Signed   By: Dorise Bullion III M.D   On: 05/22/2018 21:00        Scheduled Meds: . amoxicillin-clavulanate  1 tablet Oral Q12H  . aspirin EC  81 mg Oral Daily  . carvedilol  3.125 mg Oral Daily  . digoxin  0.0625 mg Oral Daily  . feeding supplement (ENSURE ENLIVE)  237 mL Oral BID BM  . furosemide  20 mg Oral QODAY  . heparin  5,000 Units Subcutaneous Q8H  . insulin aspart  0-9 Units Subcutaneous TID WC  . isosorbide mononitrate  15 mg Oral Daily  . mirtazapine  15 mg Oral QHS  . potassium chloride SA  20 mEq Oral QODAY  . sacubitril-valsartan  0.5 tablet Oral BID   Continuous Infusions: . sodium chloride Stopped (05/23/18 0103)     LOS: 2 days    Time spent: 32mins    Kathie Dike, MD [facility] Pager [phone]  If 7PM-7AM, please contact night-coverage [email or web address] Password [email or web address] 05/24/2018, 5:30 PM

## 2018-05-24 NOTE — Plan of Care (Signed)
  Problem: Acute Rehab PT Goals(only PT should resolve) Goal: Pt Will Go Supine/Side To Sit Outcome: Progressing Flowsheets (Taken 05/24/2018 1216) Pt will go Supine/Side to Sit: with supervision Goal: Patient Will Transfer Sit To/From Stand Outcome: Progressing Flowsheets (Taken 05/24/2018 1216) Patient will transfer sit to/from stand: with min guard assist Goal: Pt Will Transfer Bed To Chair/Chair To Bed Outcome: Progressing Flowsheets (Taken 05/24/2018 1216) Pt will Transfer Bed to Chair/Chair to Bed: min guard assist Goal: Pt Will Ambulate Outcome: Progressing Flowsheets (Taken 05/24/2018 1216) Pt will Ambulate: 50 feet; with min guard assist; with rolling walker   12:16 PM, 05/24/18 Lonell Grandchild, MPT Physical Therapist with [facility] [phone] office [phone] mobile phone

## 2018-05-24 NOTE — Progress Notes (Signed)
PT given one amp of dextrose due to critically low glucose. Glucose now 115. Continue to monitor.

## 2018-05-24 NOTE — Evaluation (Signed)
Physical Therapy Evaluation Patient Details Name: Mary Cook MRN: [id number] DOB: 1926/04/04 Today's Date: 05/24/2018   History of Present Illness  Mary Cook is a 84 y.o. female with medical history significant of CAD, chronic combined systolic and diastolic heart failure, history of cerebral aneurysm, diverticulosis, essential hypertension, frequent urination, history of diverticular bleed, hyperlipidemia, osteoarthritis, osteopenia, unspecified skin disorder, sleep apnea no longer on CPAP, history of CVA, history of T12 compression fracture, type 2 diabetes who is brought to the emergency department due to fever earlier today and progressively worse cough since Saturday.  The patient describes his cough as wet, but states that she is unable to expectorate it.  She denies having fever or night sweats prior to today to her knowledge, but complains of fatigue, dyspnea, wheezing and sore throat earlier in the week.  No sick contacts to her knowledge, however the patient has been admitted to the hospital twice in less than 2 months.  Most recently due to acute on chronic combined CHF and UTI.  She denies chest pain, palpitations, dizziness, diaphoresis, post discharge lower extremities edema, PND or orthopnea.  Denies abdominal pain, nausea, emesis, diarrhea, melena or hematochezia.  No dysuria, frequency or hematuria.  No polyuria, polydipsia, polyphagia or blurred vision.    Clinical Impression  Patient slightly impulsive and requires repeated verbal cueing for safety, demonstrates slow labored movement for sitting up and during ambulation in hallway, once fatigued, cadence slows with decreased balance requiring more assistance to avoid falling.  Patient requested to go back to bed after therapy secondary to c/o fatigue.  Patient will benefit from continued physical therapy in hospital and recommended venue below to increase strength, balance, endurance for safe ADLs and gait.    Follow  Up Recommendations SNF;Supervision for mobility/OOB;Supervision/Assistance - 24 hour    Equipment Recommendations  None recommended by PT    Recommendations for Other Services       Precautions / Restrictions Precautions Precautions: Fall Restrictions Weight Bearing Restrictions: No      Mobility  Bed Mobility Overal bed mobility: Needs Assistance Bed Mobility: Supine to Sit;Sit to Supine     Supine to sit: Min guard Sit to supine: Min assist   General bed mobility comments: requires assistance to move legs back onto bed  Transfers Overall transfer level: Needs assistance Equipment used: Rolling walker (2 wheeled) Transfers: Sit to/from [facility] Sit to Stand: Min assist;Mod assist Stand pivot transfers: Min assist;Mod assist       General transfer comment: has difficulty for sit to stands due to BLE weakness  Ambulation/Gait Ambulation/Gait assistance: Min assist Gait Distance (Feet): 35 Feet Assistive device: Rolling walker (2 wheeled) Gait Pattern/deviations: Decreased step length - right;Decreased step length - left;Decreased stride length Gait velocity: decreased   General Gait Details: slow labored cadence, once fatigued has difficulty maintaining balance, increased fall risk  Stairs            Wheelchair Mobility    Modified Rankin (Stroke Patients Only)       Balance Overall balance assessment: Needs assistance Sitting-balance support: No upper extremity supported;Feet supported Sitting balance-Leahy Scale: Good     Standing balance support: During functional activity;No upper extremity supported Standing balance-Leahy Scale: Poor Standing balance comment: fair using RW                             Pertinent Vitals/Pain Pain Assessment: No/denies pain    Home Living Family/patient  expects to be discharged to:: Assisted living               Home Equipment: Gilford Rile - 2 wheels;Cane - single  point;Wheelchair - manual;Shower seat      Prior Function Level of Independence: Needs assistance   Gait / Transfers Assistance Needed: assisted by spouse, holds her arm and walks with her, assist out of bed, to bathroom, etc.  ADL's / Homemaking Assistance Needed: assisted by spouse        Hand Dominance        Extremity/Trunk Assessment   Upper Extremity Assessment Upper Extremity Assessment: Generalized weakness    Lower Extremity Assessment Lower Extremity Assessment: Generalized weakness    Cervical / Trunk Assessment Cervical / Trunk Assessment: Normal  Communication   Communication: No difficulties  Cognition Arousal/Alertness: Awake/alert Behavior During Therapy: WFL for tasks assessed/performed Overall Cognitive Status: History of cognitive impairments - at baseline                                        General Comments      Exercises     Assessment/Plan    PT Assessment Patient needs continued PT services  PT Problem List Decreased strength;Decreased activity tolerance;Decreased balance;Decreased mobility       PT Treatment Interventions Gait training;Stair training;Functional mobility training;Therapeutic activities;Therapeutic exercise;Patient/family education    PT Goals (Current goals can be found in the Care Plan section)  Acute Rehab PT Goals Patient Stated Goal: return home PT Goal Formulation: With patient Time For Goal Achievement: 06/07/18 Potential to Achieve Goals: Good    Frequency Min 3X/week   Barriers to discharge        Co-evaluation               AM-PAC PT "6 Clicks" Daily Activity  Outcome Measure Difficulty turning over in bed (including adjusting bedclothes, sheets and blankets)?: A Little Difficulty moving from lying on back to sitting on the side of the bed? : A Little Difficulty sitting down on and standing up from a chair with arms (e.g., wheelchair, bedside commode, etc,.)?: A Lot Help  needed moving to and from a bed to chair (including a wheelchair)?: A Little Help needed walking in hospital room?: A Little Help needed climbing 3-5 steps with a railing? : A Lot 6 Click Score: 16    End of Session   Activity Tolerance: Patient tolerated treatment well;Patient limited by fatigue Patient left: in bed;with call bell/phone within reach;with bed alarm set Nurse Communication: Mobility status PT Visit Diagnosis: Unsteadiness on feet (R26.81);Other abnormalities of gait and mobility (R26.89);Muscle weakness (generalized) (M62.81)    Time: 2542-7062 PT Time Calculation (min) (ACUTE ONLY): 22 min   Charges:   PT Evaluation $PT Eval Moderate Complexity: 1 Mod PT Treatments $Therapeutic Activity: 23-37 mins        12:14 PM, 05/24/18 Lonell Grandchild, MPT Physical Therapist with [facility] [phone] office [phone] mobile phone

## 2018-05-25 DIAGNOSIS — K219 Gastro-esophageal reflux disease without esophagitis: Secondary | ICD-10-CM

## 2018-05-25 LAB — GLUCOSE, CAPILLARY
Glucose-Capillary: 104 mg/dL — ABNORMAL HIGH (ref 70–99)
Glucose-Capillary: 113 mg/dL — ABNORMAL HIGH (ref 70–99)

## 2018-05-25 LAB — CREATININE, SERUM
Creatinine, Ser: 1.33 mg/dL — ABNORMAL HIGH (ref 0.44–1.00)
GFR calc Af Amer: 39 mL/min — ABNORMAL LOW (ref >60)
GFR calc non Af Amer: 34 mL/min — ABNORMAL LOW (ref >60)

## 2018-05-25 MED ORDER — GUAIFENESIN ER 600 MG PO TB12: 600.0000 mg | ORAL_TABLET | Freq: Two times a day (BID) | ORAL | 2 refills | Status: AC

## 2018-05-25 MED ORDER — AMOXICILLIN-POT CLAVULANATE 500-125 MG PO TABS: 1.0000 | ORAL_TABLET | Freq: Two times a day (BID) | ORAL | Status: DC

## 2018-05-25 MED ORDER — ALBUTEROL SULFATE HFA 108 (90 BASE) MCG/ACT IN AERS: 2.0000 | INHALATION_SPRAY | Freq: Four times a day (QID) | RESPIRATORY_TRACT | 2 refills | Status: AC | PRN

## 2018-05-25 MED ORDER — AMOXICILLIN-POT CLAVULANATE 500-125 MG PO TABS: 1.0000 | ORAL_TABLET | Freq: Two times a day (BID) | ORAL | 0 refills | Status: DC

## 2018-05-25 NOTE — NC FL2 (Addendum)
[place] MEDICAID FL2 LEVEL OF CARE SCREENING TOOL     IDENTIFICATION  Patient Name: Mary Cook Birthdate: 10-Jul-1926 Sex: female Admission Date (Current Location): 05/22/2018  [facility] and [place] Number:  [employer] and Address:  [place] [address], [place]      Provider Number: [id number]  Attending Physician Name and Address:  Kathie Dike, MD  Relative Name and Phone Number:       Current Level of Care: Hospital Recommended Level of Care: [place] Prior Approval Number:    Date Approved/Denied:   PASRR Number:    Discharge Plan: Other (Comment)(ALF)    Current Diagnoses: Patient Active Problem List   Diagnosis Date Noted  . HCAP (healthcare-associated pneumonia) 05/22/2018  . Elevated troponin 05/22/2018  . Sinus bradycardia 04/01/2018  . Anemia 03/31/2018  . Traumatic subarachnoid hemorrhage with loss of consciousness of 30 minutes or less ([place])   . Anemia of chronic disease 09/06/2016  . Gastroenteritis 07/22/2016  . Hip pain, chronic 06/03/2016  . Dyspnea 11/15/2015  . Acute respiratory failure with hypoxia ([place]) 11/15/2015  . CHF exacerbation ([place]) 11/15/2015  . Pain in the chest   . Chronic systolic CHF (congestive heart failure) ([place])   . Ischemic cardiomyopathy   . T12 compression fracture ([place])   . Intertrigo 02/28/2015  . Actinic keratoses 02/28/2015  . Noncompliance with diet and medication regimen 02/28/2015  . LBP (low back pain) 02/13/2015  . Acute on chronic renal insufficiency 02/04/2015  . Cardiomyopathy, ischemic-EF 30-35% by echo 01/31/15 02/04/2015  . Chronic kidney disease stage III 02/03/2015  . Acute on chronic combined systolic and diastolic CHF, NYHA class 3 ([place]) 01/30/2015  . Cystitis 10/28/2014  . Insomnia 10/28/2014  . Alzheimer's disease 10/28/2014  . Falls frequently 10/12/2014  . Leg weakness, bilateral 10/12/2014  . OSA on CPAP 09/05/2014  . Rapid  palpitations 06/07/2014  . Acute chest pain 06/06/2014  . Chest pain 06/06/2014  . Thoracic back pain after fall  04/13/2013  . Stress at home 01/26/2013  . GERD (gastroesophageal reflux disease) 01/25/2013  . SOB (shortness of breath) 08/28/2011  . Abnormal EKG, marked new ant TWI this adm with negative Troponins,08/18/11 08/20/2011  . CAD S/P CFX PCI Sept 2011 08/18/2011  . Sleep apnea, cpap had been stopped secondary to "sinus issues" 08/18/2011  . [name] deficiency 03/20/2010  . Diabetes mellitus ([place]) 01/07/2008  . Hyperlipidemia 01/07/2008  . Essential hypertension 04/25/2007  . DIVERTICULOSIS, COLON 04/25/2007  . OSTEOARTHRITIS 04/25/2007  . Disorder of bone and cartilage 04/25/2007  . DIVERTICULITIS, HX OF 04/25/2007    Orientation RESPIRATION BLADDER Height & Weight     Self  Normal Incontinent Weight: 124 lb 9 oz (56.5 kg) Height:  '5\' 5"'$  (165.1 cm)  BEHAVIORAL SYMPTOMS/MOOD NEUROLOGICAL BOWEL NUTRITION STATUS      Incontinent Diet(heart healthy/carb modified. (At facility, patient is on a reduced concentrated sweets diet))  AMBULATORY STATUS COMMUNICATION OF NEEDS Skin   Limited Assist Verbally Normal                       Personal Care Assistance Level of Assistance  Bathing, Feeding, Dressing Bathing Assistance: Limited assistance Feeding assistance: Independent Dressing Assistance: Limited assistance     Functional Limitations Info  Sight, Hearing, Speech Sight Info: Adequate Hearing Info: Adequate Speech Info: Adequate    SPECIAL CARE FACTORS FREQUENCY  PT (By licensed PT)     PT Frequency: 3x/week  Contractures Contractures Info: Not present    Additional Factors Info  Code Status, Allergies Code Status Info: DNR Allergies Info: Clopidogrel Bisulfate, Lipitor, [place], Linisopril,            Current Medications (05/25/2018):  This is the current hospital active medication list Current Facility-Administered Medications   Medication Dose Route Frequency Provider Last Rate Last Dose  . 0.9 %  sodium chloride infusion   Intravenous PRN Reubin Milan, MD   Stopped at 05/23/18 0103  . acetaminophen (TYLENOL) tablet 650 mg  650 mg Oral Q6H PRN Reubin Milan, MD       Or  . acetaminophen (TYLENOL) suppository 650 mg  650 mg Rectal Q6H PRN Reubin Milan, MD      . amoxicillin-clavulanate (AUGMENTIN) 500-125 MG per tablet 500 mg  1 tablet Oral BID Kathie Dike, MD      . aspirin EC tablet 81 mg  81 mg Oral Daily Reubin Milan, MD   81 mg at 05/25/18 1058  . carvedilol (COREG) tablet 3.125 mg  3.125 mg Oral Daily Reubin Milan, MD   3.125 mg at 05/25/18 1058  . digoxin (LANOXIN) tablet 0.0625 mg  0.0625 mg Oral Daily Reubin Milan, MD   0.0625 mg at 05/25/18 1059  . feeding supplement (ENSURE ENLIVE) (ENSURE ENLIVE) liquid 237 mL  237 mL Oral BID BM Reubin Milan, MD   237 mL at 05/24/18 1744  . furosemide (LASIX) tablet 20 mg  20 mg Oral QODAY Reubin Milan, MD   20 mg at 05/24/18 1030  . heparin injection 5,000 Units  5,000 Units Subcutaneous Q8H Reubin Milan, MD   5,000 Units at 05/25/18 [phone]  . insulin aspart (novoLOG) injection 0-9 Units  0-9 Units Subcutaneous TID WC Constantin Hillery, MD      . ipratropium-albuterol (DUONEB) 0.5-2.5 (3) MG/3ML nebulizer solution 3 mL  3 mL Nebulization QID PRN Kathie Dike, MD      . isosorbide mononitrate (IMDUR) 24 hr tablet 15 mg  15 mg Oral Daily Reubin Milan, MD   15 mg at 05/25/18 1058  . mirtazapine (REMERON) tablet 15 mg  15 mg Oral QHS Reubin Milan, MD   15 mg at 05/24/18 2231  . ondansetron (ZOFRAN) tablet 4 mg  4 mg Oral Q6H PRN Reubin Milan, MD       Or  . ondansetron [facility]) injection 4 mg  4 mg Intravenous Q6H PRN Reubin Milan, MD      . potassium chloride SA (K-DUR,KLOR-CON) CR tablet 20 mEq  20 mEq Oral Glenna Durand, MD   20 mEq at 05/24/18 1031  .  sacubitril-valsartan (ENTRESTO) 49-51 mg per tablet  0.5 tablet Oral BID Reubin Milan, MD   0.5 tablet at 05/25/18 1100     Discharge Medications: Medication List    TAKE these medications   ACCU-CHEK AVIVA PLUS w/Device Kit Use to check blood sugars twice a day Dx. E11.9   acetaminophen 325 MG tablet Commonly known as:  TYLENOL Take 325 mg by mouth every 12 (twelve) hours as needed for mild pain or headache.   albuterol 108 (90 Base) MCG/ACT inhaler Commonly known as:  PROVENTIL HFA;VENTOLIN HFA Inhale 2 puffs into the lungs every 6 (six) hours as needed for wheezing or shortness of breath.   amoxicillin-clavulanate 500-125 MG tablet Commonly known as:  AUGMENTIN Take 1 tablet (500 mg total) by mouth 2 (two) times daily.  aspirin 81 MG EC tablet Take 81 mg by mouth daily.   carvedilol 3.125 MG tablet Commonly known as:  COREG Take 1 tablet (3.125 mg total) by mouth daily.   digoxin 0.125 MG tablet Commonly known as:  LANOXIN Take 0.5 tablets (0.0625 mg total) by mouth daily.   furosemide 20 MG tablet Commonly known as:  LASIX Take 1 tablet (20 mg total) by mouth every other day.   glipiZIDE 5 MG tablet Commonly known as:  GLUCOTROL Take 0.5 tablets (2.5 mg total) by mouth 2 (two) times daily before a meal.   guaiFENesin 600 MG 12 hr tablet Commonly known as:  MUCINEX Take 1 tablet (600 mg total) by mouth 2 (two) times daily.   isosorbide mononitrate 30 MG 24 hr tablet Commonly known as:  IMDUR Take 0.5 tablets (15 mg total) by mouth daily.   mirtazapine 15 MG tablet Commonly known as:  REMERON Take 1 tablet by mouth at bedtime.   potassium chloride SA 20 MEQ tablet Commonly known as:  K-DUR,KLOR-CON Take 1 tablet (20 mEq total) by mouth every other day.   sacubitril-valsartan 49-51 MG Commonly known as:  ENTRESTO Take 0.5 tablets by mouth 2 (two) times daily.     Relevant Imaging Results:  Relevant Lab Results:   Additional  Information SSN [id number], Clydene Pugh, LCSW

## 2018-05-25 NOTE — Clinical Social Work Note (Signed)
LCSW spoke with Butch Penny at [facility] and discussed SNF recommendation made by PT. Butch Penny stated that patient was at her baseline and they would take her back. LCSW advised that patient would discharge today.     Isolde Skaff, Clydene Pugh, LCSW

## 2018-05-25 NOTE — Care Management Important Message (Signed)
Important Message  Patient Details  Name: Mary Cook MRN: [id number] Date of Birth: 10/06/1925   Medicare Important Message Given:  Yes      Pt. didn't sign.   Holli Humbles Smith 05/25/2018, 12:48 PM

## 2018-05-25 NOTE — Clinical Social Work Note (Addendum)
Message left for Mary Cook, sister, advising of discharge.   Facility notified of discharge and discharge clinicals sent. Facility to transport.   LCSW signing off.    Noris Kulinski, Clydene Pugh, LCSW

## 2018-05-25 NOTE — Discharge Summary (Signed)
Physician Discharge Summary  Mary Cook HWE:[id number] DOB: 07-14-1926 DOA: 05/22/2018  PCP: Cassandria Anger, MD  Admit date: 05/22/2018 Discharge date: 05/25/2018  Admitted From: ALF Disposition:  ALF  Recommendations for Outpatient Follow-up:  1. Follow up with PCP in 1-2 weeks 2. Please obtain BMP/CBC in one week  Home Health: HH RN, PT Equipment/Devices:  Discharge Condition:stable CODE STATUS:DNR Diet recommendation: heart healthy, carb modified  Brief/Interim Summary: 84 year old female with a history of hypertension, diabetes, chronic kidney disease stage III, chronic systolic CHF, sleep apnea, who is a resident of an assisted living facility, presents to the hospital with complaints of fever and cough.  Found to have evidence of healthcare associated pneumonia.  She was treated with intravenous antibiotics.  Fevers have resolved and overall breathing has improved.  She continues to have a mild cough which is now nonproductive.  Shortness of breath is better and she is able to ambulate without significant shortness of breath.  She is been transitioned to oral antibiotics.  She is felt safe to return to her assisted living facility with home health therapy.  The remainder of her chronic medical issues have remained stable.  Discharge Diagnoses:  Principal Problem:   HCAP (healthcare-associated pneumonia) Active Problems:   Hyperlipidemia   Essential hypertension   Sleep apnea, cpap had been stopped secondary to "sinus issues"   GERD (gastroesophageal reflux disease)   Alzheimer's disease   Chronic kidney disease stage III   Chronic systolic CHF (congestive heart failure) (HCC)   Anemia of chronic disease   Elevated troponin    Discharge Instructions  Discharge Instructions    Diet - low sodium heart healthy   Complete by:  As directed    Increase activity slowly   Complete by:  As directed      Allergies as of 05/25/2018      Reactions   Clopidogrel  Bisulfate Nausea And Vomiting   Patient is not aware of this allergy   Lipitor [atorvastatin] Other (See Comments)   Weak legs   Namenda [memantine Hcl]    N/v/d   Lisinopril Cough   Patient is not aware of this allergy      Medication List    TAKE these medications   ACCU-CHEK AVIVA PLUS w/Device Kit Use to check blood sugars twice a day Dx. E11.9   acetaminophen 325 MG tablet Commonly known as:  TYLENOL Take 325 mg by mouth every 12 (twelve) hours as needed for mild pain or headache.   albuterol 108 (90 Base) MCG/ACT inhaler Commonly known as:  PROVENTIL HFA;VENTOLIN HFA Inhale 2 puffs into the lungs every 6 (six) hours as needed for wheezing or shortness of breath.   amoxicillin-clavulanate 500-125 MG tablet Commonly known as:  AUGMENTIN Take 1 tablet (500 mg total) by mouth 2 (two) times daily.   aspirin 81 MG EC tablet Take 81 mg by mouth daily.   carvedilol 3.125 MG tablet Commonly known as:  COREG Take 1 tablet (3.125 mg total) by mouth daily.   digoxin 0.125 MG tablet Commonly known as:  LANOXIN Take 0.5 tablets (0.0625 mg total) by mouth daily.   furosemide 20 MG tablet Commonly known as:  LASIX Take 1 tablet (20 mg total) by mouth every other day.   glipiZIDE 5 MG tablet Commonly known as:  GLUCOTROL Take 0.5 tablets (2.5 mg total) by mouth 2 (two) times daily before a meal.   guaiFENesin 600 MG 12 hr tablet Commonly known as:  MUCINEX Take 1 tablet (  600 mg total) by mouth 2 (two) times daily.   isosorbide mononitrate 30 MG 24 hr tablet Commonly known as:  IMDUR Take 0.5 tablets (15 mg total) by mouth daily.   mirtazapine 15 MG tablet Commonly known as:  REMERON Take 1 tablet by mouth at bedtime.   potassium chloride SA 20 MEQ tablet Commonly known as:  K-DUR,KLOR-CON Take 1 tablet (20 mEq total) by mouth every other day.   sacubitril-valsartan 49-51 MG Commonly known as:  ENTRESTO Take 0.5 tablets by mouth 2 (two) times daily.        Allergies  Allergen Reactions  . Clopidogrel Bisulfate Nausea And Vomiting    Patient is not aware of this allergy  . Lipitor [Atorvastatin] Other (See Comments)    Weak legs  . Namenda [Memantine Hcl]     N/v/d  . Lisinopril Cough    Patient is not aware of this allergy    Consultations:     Procedures/Studies: Dg Chest 2 View  Result Date: 05/08/2018 CLINICAL DATA:  Dyspnea. EXAM: CHEST - 2 VIEW COMPARISON:  Radiographs of March 31, 2018. FINDINGS: Stable cardiomegaly. Atherosclerosis of thoracic aorta is noted. Mild bibasilar subsegmental atelectasis or edema is noted with small pleural effusions. No pneumothorax is noted. Bony thorax is unremarkable. IMPRESSION: Interval development of mild bibasilar subsegmental atelectasis or edema with small pleural effusions. Aortic Atherosclerosis (ICD10-I70.0). Electronically Signed   By: Marijo Conception, M.D.   On: 05/08/2018 14:32   Dg Chest Portable 1 View  Result Date: 05/22/2018 CLINICAL DATA:  Fever. EXAM: PORTABLE CHEST 1 VIEW COMPARISON:  May 08, 2018 FINDINGS: Stable cardiomegaly. The hila and mediastinum are unchanged. No pneumothorax. No pulmonary nodules or masses. No focal infiltrates. IMPRESSION: No active disease. Electronically Signed   By: Dorise Bullion III M.D   On: 05/22/2018 21:00       Subjective: Feeling better. No wheezing. Cough is less productive  Discharge Exam: Vitals:   05/24/18 1311 05/24/18 2128 05/25/18 0537 05/25/18 1058  BP: (!) 126/54 (!) 155/65 (!) 167/71   Pulse: 71 73 75 82  Resp: _0 Temp: 98.9 F (37.2 C) 98.5 F (36.9 C) (!) 97.2 F (36.2 C)   TempSrc: Oral Oral    SpO2: 95% 97% 100%   Weight:      Height:        General: Pt is alert, awake, not in acute distress Cardiovascular: RRR, S1/S2 +, no rubs, no gallops Respiratory: CTA bilaterally, no wheezing, no rhonchi Abdominal: Soft, NT, ND, bowel sounds + Extremities: no edema, no cyanosis    The results of  significant diagnostics from this hospitalization (including imaging, microbiology, ancillary and laboratory) are listed below for reference.     Microbiology: Recent Results (from the past 240 hour(s))  Blood Culture (routine x 2)     Status: None (Preliminary result)   Collection Time: 05/22/18  8:31 PM  Result Value Ref Range Status   Specimen Description RIGHT ANTECUBITAL  Final   Special Requests   Final    BOTTLES DRAWN AEROBIC AND ANAEROBIC Blood Culture results may not be optimal due to an inadequate volume of blood received in culture bottles   Culture   Final    NO GROWTH 3 DAYS Performed at [facility], [address] [place], [place] [address]    Report Status PENDING  Incomplete  Blood Culture (routine x 2)     Status: None (Preliminary result)   Collection Time: 05/22/18  8:32  PM  Result Value Ref Range Status   Specimen Description BLOOD LEFT HAND  Final   Special Requests   Final    BOTTLES DRAWN AEROBIC ONLY Blood Culture adequate volume   Culture   Final    NO GROWTH 3 DAYS Performed at [facility], [address], [place], [place] [address]    Report Status PENDING  Incomplete  Urine culture     Status: None   Collection Time: 05/22/18  8:51 PM  Result Value Ref Range Status   Specimen Description   Final    URINE, CLEAN CATCH Performed at [facility], [address], [place], [place] [address]    Special Requests   Final    NONE Performed at [facility], [address] [place], [place] [address]    Culture   Final    Multiple bacterial morphotypes present, none predominant. Suggest appropriate recollection if clinically indicated.   Report Status 05/24/2018 FINAL  Final     Labs: BNP (last 3 results) Recent Labs    05/08/18 1406  BNP 7,341.9*   Basic Metabolic Panel: Recent Labs  Lab 05/22/18 2003 05/24/18 0613 05/25/18 0545  NA 136 143  --   K 3.8 3.2*  --   CL 106 111  --   CO2 24 23  --   GLUCOSE 132* 89  --   BUN 20 24*  --    CREATININE 1.50* 1.45* 1.33*  CALCIUM 8.8* 8.7*  --   MG 2.0  --   --   PHOS 3.0  --   --    Liver Function Tests: Recent Labs  Lab 05/22/18 2003  AST 15  ALT 11  ALKPHOS 59  BILITOT 0.6  PROT 6.5  ALBUMIN 3.0*   No results for input(s): LIPASE, AMYLASE in the last 168 hours. No results for input(s): AMMONIA in the last 168 hours. CBC: Recent Labs  Lab 05/22/18 2003 05/24/18 0613  WBC 9.1 8.0  NEUTROABS 7.1  --   HGB 9.3* 9.1*  HCT 28.3* 28.7*  MCV 94.3 96.3  PLT 225 228   Cardiac Enzymes: Recent Labs  Lab 05/22/18 2003  TROPONINI 0.03*   BNP: Invalid input(s): POCBNP CBG: Recent Labs  Lab 05/24/18 1134 05/24/18 1629 05/24/18 2127 05/25/18 0736 05/25/18 1131  GLUCAP 111* 119* 93 104* 113*   D-Dimer No results for input(s): DDIMER in the last 72 hours. Hgb A1c No results for input(s): HGBA1C in the last 72 hours. Lipid Profile No results for input(s): CHOL, HDL, LDLCALC, TRIG, CHOLHDL, LDLDIRECT in the last 72 hours. Thyroid function studies No results for input(s): TSH, T4TOTAL, T3FREE, THYROIDAB in the last 72 hours.  Invalid input(s): FREET3 Anemia work up No results for input(s): VITAMINB12, FOLATE, FERRITIN, TIBC, IRON, RETICCTPCT in the last 72 hours. Urinalysis    Component Value Date/Time   COLORURINE YELLOW 05/22/2018 2051   APPEARANCEUR CLEAR 05/22/2018 2051   LABSPEC 1.013 05/22/2018 2051   PHURINE 6.0 05/22/2018 2051   GLUCOSEU NEGATIVE 05/22/2018 2051   GLUCOSEU NEGATIVE 10/12/2014 1053   HGBUR NEGATIVE 05/22/2018 2051   [place] NEGATIVE 05/22/2018 2051   [place] NEGATIVE 05/22/2018 2051   PROTEINUR NEGATIVE 05/22/2018 2051   UROBILINOGEN 1.0 04/28/2015 1658   NITRITE NEGATIVE 05/22/2018 2051   LEUKOCYTESUR NEGATIVE 05/22/2018 2051   Sepsis Labs Invalid input(s): PROCALCITONIN,  WBC,  LACTICIDVEN Microbiology Recent Results (from the past 240 hour(s))  Blood Culture (routine x 2)     Status: None (Preliminary result)  Collection Time: 05/22/18  8:31 PM  Result Value Ref Range Status   Specimen Description RIGHT ANTECUBITAL  Final   Special Requests   Final    BOTTLES DRAWN AEROBIC AND ANAEROBIC Blood Culture results may not be optimal due to an inadequate volume of blood received in culture bottles   Culture   Final    NO GROWTH 3 DAYS Performed at [facility], [address] [place], [place] [address]    Report Status PENDING  Incomplete  Blood Culture (routine x 2)     Status: None (Preliminary result)   Collection Time: 05/22/18  8:32 PM  Result Value Ref Range Status   Specimen Description BLOOD LEFT HAND  Final   Special Requests   Final    BOTTLES DRAWN AEROBIC ONLY Blood Culture adequate volume   Culture   Final    NO GROWTH 3 DAYS Performed at [facility], [address] [place], [place] [address]    Report Status PENDING  Incomplete  Urine culture     Status: None   Collection Time: 05/22/18  8:51 PM  Result Value Ref Range Status   Specimen Description   Final    URINE, CLEAN CATCH Performed at [facility], [address] [place], [place] [address]    Special Requests   Final    NONE Performed at [facility], [address] [place], [place] [address]    Culture   Final    Multiple bacterial morphotypes present, none predominant. Suggest appropriate recollection if clinically indicated.   Report Status 05/24/2018 FINAL  Final     Time coordinating discharge: 59mns  SIGNED:   JKathie Dike MD  [facility] 05/25/2018, 2:03 PM Pager   If 7PM-7AM, please contact night-coverage [email or web address] Password [email or web address]

## 2018-05-25 NOTE — Care Management Note (Signed)
Case Management Note  Patient Details  Name: JESI JURGENS MRN: [id number] Date of Birth: 1926-07-19  Subjective/Objective:       Admitted with pnuemonia. From [facility]F. Pt active with [facility] pta.              Action/Plan: DC back to facility today. Will resume [facility] services. Vaughan Basta, [facility] rep, aware of DC today. Pt will need order to resume services. CSW to make arrangements for return to facility.  Expected Discharge Date:  05/25/18               Expected Discharge Plan:  Assisted Living / Rest Home(with home health)  In-House Referral:  Clinical Social Work  Discharge planning Services  CM Consult  Post Acute Care Choice:  Home Health, Resumption of Svcs/PTA Provider Choice offered to:  NA  HH Arranged:  Therapist, sports, PT [facility] Agency:  [place]  Status of Service:  Completed, signed off  Sherald Barge, RN 05/25/2018, 2:05 PM

## 2018-05-25 NOTE — Progress Notes (Signed)
AVS packet given to transporter for [facility] at discharge.  Patient transported by NT via w/c to entrance at discharge.  Patient stable at time of discharge.

## 2018-05-26 ENCOUNTER — Telehealth: Payer: Self-pay | Admitting: *Deleted

## 2018-05-26 DIAGNOSIS — E1122 Type 2 diabetes mellitus with diabetic chronic kidney disease: Secondary | ICD-10-CM | POA: Diagnosis not present

## 2018-05-26 DIAGNOSIS — N183 Chronic kidney disease, stage 3 (moderate): Secondary | ICD-10-CM | POA: Diagnosis not present

## 2018-05-26 DIAGNOSIS — I2510 Atherosclerotic heart disease of native coronary artery without angina pectoris: Secondary | ICD-10-CM | POA: Diagnosis not present

## 2018-05-26 DIAGNOSIS — F0280 Dementia in other diseases classified elsewhere without behavioral disturbance: Secondary | ICD-10-CM | POA: Diagnosis not present

## 2018-05-26 DIAGNOSIS — I130 Hypertensive heart and chronic kidney disease with heart failure and stage 1 through stage 4 chronic kidney disease, or unspecified chronic kidney disease: Secondary | ICD-10-CM | POA: Diagnosis not present

## 2018-05-26 DIAGNOSIS — D631 Anemia in chronic kidney disease: Secondary | ICD-10-CM | POA: Diagnosis not present

## 2018-05-26 DIAGNOSIS — I5042 Chronic combined systolic (congestive) and diastolic (congestive) heart failure: Secondary | ICD-10-CM | POA: Diagnosis not present

## 2018-05-26 DIAGNOSIS — G4733 Obstructive sleep apnea (adult) (pediatric): Secondary | ICD-10-CM | POA: Diagnosis not present

## 2018-05-26 DIAGNOSIS — G309 Alzheimer's disease, unspecified: Secondary | ICD-10-CM | POA: Diagnosis not present

## 2018-05-26 NOTE — Telephone Encounter (Signed)
Pt was on TCM report needing to get hosp f/u appt in 7/14 days. Called to get pt set-up for appt per nurse "Butch Penny" at the ALF. Pt see their primary MD. She no longer see Dr. Alain Marion as her primary doctor.Marland KitchenJohny Cook

## 2018-05-27 DIAGNOSIS — G309 Alzheimer's disease, unspecified: Secondary | ICD-10-CM | POA: Diagnosis not present

## 2018-05-27 DIAGNOSIS — D631 Anemia in chronic kidney disease: Secondary | ICD-10-CM | POA: Diagnosis not present

## 2018-05-27 DIAGNOSIS — I130 Hypertensive heart and chronic kidney disease with heart failure and stage 1 through stage 4 chronic kidney disease, or unspecified chronic kidney disease: Secondary | ICD-10-CM | POA: Diagnosis not present

## 2018-05-27 DIAGNOSIS — I5042 Chronic combined systolic (congestive) and diastolic (congestive) heart failure: Secondary | ICD-10-CM | POA: Diagnosis not present

## 2018-05-27 DIAGNOSIS — N183 Chronic kidney disease, stage 3 (moderate): Secondary | ICD-10-CM | POA: Diagnosis not present

## 2018-05-27 DIAGNOSIS — G4733 Obstructive sleep apnea (adult) (pediatric): Secondary | ICD-10-CM | POA: Diagnosis not present

## 2018-05-27 DIAGNOSIS — F0280 Dementia in other diseases classified elsewhere without behavioral disturbance: Secondary | ICD-10-CM | POA: Diagnosis not present

## 2018-05-27 DIAGNOSIS — I2510 Atherosclerotic heart disease of native coronary artery without angina pectoris: Secondary | ICD-10-CM | POA: Diagnosis not present

## 2018-05-27 DIAGNOSIS — E1122 Type 2 diabetes mellitus with diabetic chronic kidney disease: Secondary | ICD-10-CM | POA: Diagnosis not present

## 2018-05-27 LAB — CULTURE, BLOOD (ROUTINE X 2)
Culture: NO GROWTH
Culture: NO GROWTH
Special Requests: ADEQUATE

## 2018-05-28 ENCOUNTER — Ambulatory Visit (INDEPENDENT_AMBULATORY_CARE_PROVIDER_SITE_OTHER): Payer: Medicare HMO | Admitting: Physician Assistant

## 2018-05-28 ENCOUNTER — Encounter: Payer: Self-pay | Admitting: Physician Assistant

## 2018-05-28 VITALS — BP 110/52 | HR 74 | Ht 65.0 in | Wt 127.5 lb

## 2018-05-28 DIAGNOSIS — G309 Alzheimer's disease, unspecified: Secondary | ICD-10-CM | POA: Diagnosis not present

## 2018-05-28 DIAGNOSIS — G4733 Obstructive sleep apnea (adult) (pediatric): Secondary | ICD-10-CM | POA: Diagnosis not present

## 2018-05-28 DIAGNOSIS — I2510 Atherosclerotic heart disease of native coronary artery without angina pectoris: Secondary | ICD-10-CM

## 2018-05-28 DIAGNOSIS — I5043 Acute on chronic combined systolic (congestive) and diastolic (congestive) heart failure: Secondary | ICD-10-CM | POA: Diagnosis not present

## 2018-05-28 DIAGNOSIS — D631 Anemia in chronic kidney disease: Secondary | ICD-10-CM | POA: Diagnosis not present

## 2018-05-28 DIAGNOSIS — E1122 Type 2 diabetes mellitus with diabetic chronic kidney disease: Secondary | ICD-10-CM | POA: Diagnosis not present

## 2018-05-28 DIAGNOSIS — I10 Essential (primary) hypertension: Secondary | ICD-10-CM

## 2018-05-28 DIAGNOSIS — E785 Hyperlipidemia, unspecified: Secondary | ICD-10-CM

## 2018-05-28 DIAGNOSIS — I130 Hypertensive heart and chronic kidney disease with heart failure and stage 1 through stage 4 chronic kidney disease, or unspecified chronic kidney disease: Secondary | ICD-10-CM | POA: Diagnosis not present

## 2018-05-28 DIAGNOSIS — E119 Type 2 diabetes mellitus without complications: Secondary | ICD-10-CM | POA: Diagnosis not present

## 2018-05-28 DIAGNOSIS — I255 Ischemic cardiomyopathy: Secondary | ICD-10-CM | POA: Diagnosis not present

## 2018-05-28 DIAGNOSIS — N183 Chronic kidney disease, stage 3 (moderate): Secondary | ICD-10-CM | POA: Diagnosis not present

## 2018-05-28 DIAGNOSIS — F0280 Dementia in other diseases classified elsewhere without behavioral disturbance: Secondary | ICD-10-CM | POA: Diagnosis not present

## 2018-05-28 DIAGNOSIS — I5042 Chronic combined systolic (congestive) and diastolic (congestive) heart failure: Secondary | ICD-10-CM | POA: Diagnosis not present

## 2018-05-28 LAB — BASIC METABOLIC PANEL
BUN/Creatinine Ratio: 17 (ref 12–28)
BUN: 28 mg/dL (ref 10–36)
CALCIUM: 9.4 mg/dL (ref 8.7–10.3)
CHLORIDE: 106 mmol/L (ref 96–106)
CO2: 22 mmol/L (ref 20–29)
Creatinine, Ser: 1.61 mg/dL — ABNORMAL HIGH (ref 0.57–1.00)
GFR calc Af Amer: 32 mL/min/{1.73_m2} — ABNORMAL LOW (ref >59)
GFR calc non Af Amer: 28 mL/min/{1.73_m2} — ABNORMAL LOW (ref >59)
Glucose: 132 mg/dL — ABNORMAL HIGH (ref 65–99)
Potassium: 4.6 mmol/L (ref 3.5–5.2)
Sodium: 142 mmol/L (ref 134–144)

## 2018-05-28 MED ORDER — FUROSEMIDE 20 MG PO TABS: 20.0000 mg | ORAL_TABLET | ORAL | 0 refills | Status: AC

## 2018-05-28 MED ORDER — POTASSIUM CHLORIDE CRYS ER 20 MEQ PO TBCR: 20.0000 meq | EXTENDED_RELEASE_TABLET | ORAL | 0 refills | Status: AC

## 2018-05-28 NOTE — Progress Notes (Signed)
Cardiology Office Note    Date:  05/30/2018   ID:  Mary Cook, DOB 27-Apr-1926, MRN [id number]  PCP:  No primary care provider on file.  Cardiologist:  Dr. Claiborne Billings   Chief Complaint  Patient presents with  . Hospitalization Follow-up    seen for Dr. Claiborne Billings.     History of Present Illness:  Mary Cook is a 84 y.o. female with PMH of CAD, cerebral aneurysm, chronic combined systolic and diastolic heart failure, hypertension, GIB, hyperlipidemia, ischemic cardiomyopathy, CVA, OSA and DM2.  In September 2011, she suffered a large out of the hospital MI complicated by CHF and post MI pericarditis.  She had a significant thrombus burden requiring thrombectomy.  She underwent stenting of her left circumflex artery with DES.  She also had residual disease in diagonal branch and RCA.  She had a repeat cardiac catheterization in October 2015 which showed moderate LV dysfunction, EF 35%, 50 to 60% eccentric stenosis in first D1, widely patent proximal left circumflex stents with focal 70% stenosis in a small AV groove left circumflex, 50 to 60% proximal RCA with very mild mid systolic bridging in a dominant RCA system.  Medical therapy was recommended.  Myoview obtained in March 2017 was consistent with prior MI was very mild peri-infarct ischemia involving the inferolateral wall.  She was last seen by Dr. Claiborne Billings in May 2018, at which time she complained of occasional dizziness.  Her carvedilol was reduced to 6.25 mg twice daily, digoxin reduced to 0.0625 mg daily.  Since the last office visit, patient was admitted to the hospital in August 2019 with generalized weakness and worsening mentation.  She was found to be dehydrated with acute on chronic renal insufficiency and bradycardia.  Symptoms improved with IV fluids.  All blood pressure medication were held including Entresto.  Once renal function returned to normal, she was resumed on carvedilol and the Imdur added low-dose and also resumed  Entresto upon discharge.  Amlodipine was discontinued.  Patient was readmitted to the hospital in September with worsening shortness of breath.  She was diagnosed with acute on chronic combined systolic and diastolic heart failure.  She underwent diuresis and removed 2.3 L.  She was discharged on 20 mg Lasix every other day.  Echocardiogram was repeated on 05/09/2018 which showed EF 30 to 35%, mild AI, mild to moderate MR, PA peak pressure 40 mmHg, small pericardial effusion was noted.  EF has improved from the previous 20 to 25% in 2017.  Patient was again readmitted near the end of September with fever and cough.  He she was found to have evidence of healthcare associated pneumonia and this was treated with IV antibiotic.  She currently resides in the [facility] long-term nursing facility in [place].  She says her breathing is still not at baseline yet.  On physical exam, she has crackles in bilateral bases of the lung.  I think majority of this is likely atelectasis since she is quite sedentary.  According to her nursing aide, the only time she moves around is doing physical therapy 3 times a week.  I do think physical therapy is beneficial for her.  Her blood pressure is stable on today's exam.  She denies any orthopnea or PND.  Her weight did increase by 3 pounds overnight, I recommended for her to take extra dose of Lasix along with potassium for tomorrow before resuming every other day of Lasix on Saturday.  Otherwise she denies any chest pain.  Her  most recent lab work obtained in September this year does show that her HDL is 68, LDL 103, total cholesterol 188, triglyceride 78.  She has a history of statin intolerance with Lipitor causing weakness.  I did not add on any statin today, however this can be reconsidered on follow-up if she is feeling better.  Otherwise I went through her nursing facility medication list and compared to 2 hours, the medication list is correct.  Past Medical History:    Diagnosis Date  . CAD (coronary artery disease)    a. 04/2010 MI DES x 2 to LCX;  b. 05/2014 Cath: EF 35%, patent LCX stents w/ 70% stenosis in small AV groove LCX, Diag 50-60, RCA 50-60p->Med Rx.  . Cerebral aneurysm   . Chronic combined systolic (congestive) and diastolic (congestive) heart failure ([place])    a. 01/2015 Echo: EF 30-35%, sev inflat HK, Gr 1DD, mild AI.  Marland Kitchen Diverticulosis of colon   . Essential hypertension   . Frequent urination   . GIB (gastrointestinal bleeding)    a. 2002 Diverticular bleed.  . Hyperlipidemia   . Ischemic cardiomyopathy    a. 01/2015 Echo: EF 30-35%.  . Osteoarthritis   . Osteopenia   . Skin disorder   . Sleep apnea    cpap therapy  . Stroke ([place]) 2002  . T12 compression fracture ([place])    a. 04/2015.  Marland Kitchen Thrombus   . Type II diabetes mellitus ([place])   . Visual disorder     Past Surgical History:  Procedure Laterality Date  . ABDOMINAL HYSTERECTOMY  1980   no bso  . APPENDECTOMY    . CARDIAC CATHETERIZATION  07/2011   LAD OK, D1 80%, CFX 30% w/ patent stent, RCA 50-60%, EF 40%  . CARDIAC CATHETERIZATION    . CORONARY ANGIOPLASTY WITH STENT PLACEMENT  05/11/2010   stent CFX  . DOPPLER ECHOCARDIOGRAPHY    . LEFT HEART CATHETERIZATION WITH CORONARY ANGIOGRAM N/A 08/18/2011   Procedure: LEFT HEART CATHETERIZATION WITH CORONARY ANGIOGRAM;  Surgeon: Wellington Hampshire, MD;  Location: [facility] CATH LAB;  Service: Cardiovascular;  Laterality: N/A;  . LEFT HEART CATHETERIZATION WITH CORONARY ANGIOGRAM N/A 06/08/2014   Procedure: LEFT HEART CATHETERIZATION WITH CORONARY ANGIOGRAM;  Surgeon: Troy Sine, MD;  Location: [facility] CATH LAB;  Service: Cardiovascular;  Laterality: N/A;  . myocardial perfusion study    . THROMBECTOMY    . TONSILLECTOMY      Current Medications: Outpatient Medications Prior to Visit  Medication Sig Dispense Refill  . acetaminophen (TYLENOL) 325 MG tablet Take 325 mg by mouth every 12 (twelve) hours as needed for mild pain or  headache.    . albuterol (PROVENTIL HFA;VENTOLIN HFA) 108 (90 Base) MCG/ACT inhaler Inhale 2 puffs into the lungs every 6 (six) hours as needed for wheezing or shortness of breath. 1 Inhaler 2  . amoxicillin-clavulanate (AUGMENTIN) 500-125 MG tablet Take 1 tablet (500 mg total) by mouth 2 (two) times daily. 6 tablet 0  . aspirin 81 MG EC tablet Take 81 mg by mouth daily.      . Blood Glucose Monitoring Suppl (ACCU-CHEK AVIVA PLUS) w/Device KIT Use to check blood sugars twice a day Dx. E11.9 1 kit 0  . carvedilol (COREG) 3.125 MG tablet Take 1 tablet (3.125 mg total) by mouth daily. 30 tablet 1  . digoxin (LANOXIN) 0.125 MG tablet Take 0.5 tablets (0.0625 mg total) by mouth daily.    Marland Kitchen glipiZIDE (GLUCOTROL) 5 MG tablet Take 0.5 tablets (2.5 mg  total) by mouth 2 (two) times daily before a meal. 30 tablet 1  . guaiFENesin (MUCINEX) 600 MG 12 hr tablet Take 1 tablet (600 mg total) by mouth 2 (two) times daily. 60 tablet 2  . isosorbide mononitrate (IMDUR) 30 MG 24 hr tablet Take 0.5 tablets (15 mg total) by mouth daily. 30 tablet 1  . mirtazapine (REMERON) 15 MG tablet Take 1 tablet by mouth at bedtime.    . sacubitril-valsartan (ENTRESTO) 49-51 MG Take 0.5 tablets by mouth 2 (two) times daily. 60 tablet 0  . furosemide (LASIX) 20 MG tablet Take 1 tablet (20 mg total) by mouth every other day.    . potassium chloride SA (K-DUR,KLOR-CON) 20 MEQ tablet Take 1 tablet (20 mEq total) by mouth every other day.     No facility-administered medications prior to visit.      Allergies:   Clopidogrel bisulfate; Lipitor [atorvastatin]; Namenda [memantine hcl]; and Lisinopril   Social History   Socioeconomic History  . Marital status: Married    Spouse name: Not on file  . Number of children: Not on file  . Years of education: Not on file  . Highest education level: Not on file  Occupational History  . Occupation: Retired  Scientific laboratory technician  . Financial resource strain: Not on file  . Food insecurity:     Worry: Not on file    Inability: Not on file  . Transportation needs:    Medical: Not on file    Non-medical: Not on file  Tobacco Use  . Smoking status: Never Smoker  . Smokeless tobacco: Never Used  Substance and Sexual Activity  . Alcohol use: No  . Drug use: No  . Sexual activity: Not on file  Lifestyle  . Physical activity:    Days per week: Not on file    Minutes per session: Not on file  . Stress: Not on file  Relationships  . Social connections:    Talks on phone: Not on file    Gets together: Not on file    Attends religious service: Not on file    Active member of club or organization: Not on file    Attends meetings of clubs or organizations: Not on file    Relationship status: Not on file  Other Topics Concern  . Not on file  Social History Narrative  . Not on file     Family History:  The patient's family history includes CAD in her unknown relative; Hypertension in her other.   ROS:   Please see the history of present illness.    ROS All other systems reviewed and are negative.   PHYSICAL EXAM:   VS:  BP (!) 110/52   Pulse 74   Ht '5\' 5"'$  (1.651 m)   Wt 127 lb 8 oz (57.8 kg)   SpO2 95%   BMI 21.22 kg/m    GEN: Well nourished, well developed, in no acute distress  HEENT: normal  Neck: no JVD, carotid bruits, or masses Cardiac: RRR; no murmurs, rubs, or gallops. Trace edema  Respiratory:  clear to auscultation bilaterally, normal work of breathing GI: soft, nontender, nondistended, + BS MS: no deformity or atrophy  Skin: warm and dry, no rash Neuro:  Alert and Oriented x 3, Strength and sensation are intact Psych: euthymic mood, full affect  Wt Readings from Last 3 Encounters:  05/28/18 127 lb 8 oz (57.8 kg)  05/22/18 124 lb 9 oz (56.5 kg)  05/11/18 115 lb 3.2 oz (  52.3 kg)      Studies/Labs Reviewed:   EKG:  EKG is not ordered today.    Recent Labs: 05/08/2018: B Natriuretic Peptide 1,993.0 05/22/2018: ALT 11; Magnesium 2.0 05/24/2018:  Hemoglobin 9.1; Platelets 228 05/28/2018: BUN 28; Creatinine, Ser 1.61; Potassium 4.6; Sodium 142   Lipid Panel    Component Value Date/Time   CHOL 230 (H) 11/16/2015 0240   TRIG 97 11/16/2015 0240   HDL 68 11/16/2015 0240   CHOLHDL 3.4 11/16/2015 0240   VLDL 19 11/16/2015 0240   LDLCALC 143 (H) 11/16/2015 0240   LDLDIRECT 180.7 12/09/2011 1109    Additional studies/ records that were reviewed today include:   Echo 05/09/2018 LV EF: 30% -   35% Study Conclusions  - Left ventricle: The cavity size was normal. Wall thickness was   normal. Systolic function was moderately to severely reduced. The   estimated ejection fraction was in the range of 30% to 35%.   Diffuse hypokinesis. There is severe hypokinesis of the   inferolateral and inferior myocardium. Indeterminate diastolic   function. - Aortic valve: Mildly calcified annulus. Trileaflet. There was   mild regurgitation. - Mitral valve: Moderately to severely calcified annulus. Mildly   calcified leaflets . There was mild to moderate regurgitation. - Right atrium: Central venous pressure (est): 3 mm Hg. - Atrial septum: No defect or patent foramen ovale was identified. - Pulmonary arteries: PA peak pressure: 40 mm Hg (S). - Pericardium, extracardiac: A small pericardial effusion was   identified anterior to the heart.    ASSESSMENT:    1. Acute on chronic combined systolic and diastolic CHF, NYHA class 3 ([place])   2. Coronary artery disease involving native coronary artery of native heart without angina pectoris   3. Essential hypertension   4. Hyperlipidemia, unspecified hyperlipidemia type   5. Ischemic cardiomyopathy   6. Controlled type 2 diabetes mellitus without complication, without long-term current use of insulin (HCC)      PLAN:  In order of problems listed above:  1. Acute on chronic combined systolic and diastolic heart failure: Her weight has increased by 3 pounds overnight, I would recommend to take  extra dose of Lasix and potassium for tomorrow before going back to Lasix every other day.  Obtain basic metabolic panel today  2. CAD: Denies any chest pain  3. Hypertension: Blood pressure stable  4. Hyperlipidemia: Not on statin, will readdress at follow-up once she is feeling better  5. Ischemic cardiomyopathy: On carvedilol, digoxin and Entresto.  6. DM2: Managed by primary care provider    Medication Adjustments/Labs and Tests Ordered: Current medicines are reviewed at length with the patient today.  Concerns regarding medicines are outlined above.  Medication changes, Labs and Tests ordered today are listed in the Patient Instructions below. Patient Instructions  Medication Instructions:  Your physician has recommended you make the following change in your medication: TAKE an extra dosage of Lasix '20mg'$  and Potassium 20 mEq tomorrow 05/29/18  Labwork: Bmet today  Testing/Procedures: None ordered  Follow-Up: Your physician recommends that you schedule a follow-up appointment in: 1 month with Dr.Kelly   Any Other Special Instructions Will Be Listed Below (If Applicable).     If you need a refill on your cardiac medications before your next appointment, please call your pharmacy.      Hilbert Corrigan, [place]  05/30/2018 12:43 AM    [address], [place],   [address] Phone: [phone]; Fax: [phone]  [phone]

## 2018-05-28 NOTE — Patient Instructions (Addendum)
Medication Instructions:  Your physician has recommended you make the following change in your medication: TAKE an extra dosage of Lasix 20mg  and Potassium 20 mEq tomorrow 05/29/18  Labwork: Bmet today  Testing/Procedures: None ordered  Follow-Up: Your physician recommends that you schedule a follow-up appointment in: 1 month with Dr.Kelly   Any Other Special Instructions Will Be Listed Below (If Applicable).     If you need a refill on your cardiac medications before your next appointment, please call your pharmacy.

## 2018-05-30 ENCOUNTER — Encounter: Payer: Self-pay | Admitting: Physician Assistant

## 2018-06-01 ENCOUNTER — Inpatient Hospital Stay (HOSPITAL_COMMUNITY)
Admission: EM | Admit: 2018-06-01 | Discharge: 2018-06-06 | DRG: 481 | Disposition: A | Discharge status: Other Acute Inpatient Hospital | Payer: Medicare HMO | Attending: Internal Medicine | Admitting: Internal Medicine

## 2018-06-01 ENCOUNTER — Emergency Department (HOSPITAL_COMMUNITY): Payer: Medicare HMO

## 2018-06-01 ENCOUNTER — Other Ambulatory Visit: Payer: Self-pay

## 2018-06-01 ENCOUNTER — Encounter (HOSPITAL_COMMUNITY): Payer: Self-pay | Admitting: *Deleted

## 2018-06-01 DIAGNOSIS — Z419 Encounter for procedure for purposes other than remedying health state, unspecified: Secondary | ICD-10-CM

## 2018-06-01 DIAGNOSIS — S72002A Fracture of unspecified part of neck of left femur, initial encounter for closed fracture: Secondary | ICD-10-CM | POA: Diagnosis not present

## 2018-06-01 DIAGNOSIS — Z79899 Other long term (current) drug therapy: Secondary | ICD-10-CM | POA: Diagnosis not present

## 2018-06-01 DIAGNOSIS — S0990XA Unspecified injury of head, initial encounter: Secondary | ICD-10-CM | POA: Diagnosis not present

## 2018-06-01 DIAGNOSIS — F0280 Dementia in other diseases classified elsewhere without behavioral disturbance: Secondary | ICD-10-CM | POA: Diagnosis present

## 2018-06-01 DIAGNOSIS — Z7401 Bed confinement status: Secondary | ICD-10-CM | POA: Diagnosis not present

## 2018-06-01 DIAGNOSIS — N183 Chronic kidney disease, stage 3 (moderate): Secondary | ICD-10-CM | POA: Diagnosis not present

## 2018-06-01 DIAGNOSIS — G301 Alzheimer's disease with late onset: Secondary | ICD-10-CM

## 2018-06-01 DIAGNOSIS — I5042 Chronic combined systolic (congestive) and diastolic (congestive) heart failure: Secondary | ICD-10-CM | POA: Diagnosis not present

## 2018-06-01 DIAGNOSIS — I2510 Atherosclerotic heart disease of native coronary artery without angina pectoris: Secondary | ICD-10-CM | POA: Diagnosis not present

## 2018-06-01 DIAGNOSIS — Z66 Do not resuscitate: Secondary | ICD-10-CM | POA: Diagnosis present

## 2018-06-01 DIAGNOSIS — E1122 Type 2 diabetes mellitus with diabetic chronic kidney disease: Secondary | ICD-10-CM | POA: Diagnosis present

## 2018-06-01 DIAGNOSIS — Z8673 Personal history of transient ischemic attack (TIA), and cerebral infarction without residual deficits: Secondary | ICD-10-CM | POA: Diagnosis not present

## 2018-06-01 DIAGNOSIS — R079 Chest pain, unspecified: Secondary | ICD-10-CM | POA: Diagnosis present

## 2018-06-01 DIAGNOSIS — K219 Gastro-esophageal reflux disease without esophagitis: Secondary | ICD-10-CM | POA: Diagnosis present

## 2018-06-01 DIAGNOSIS — Z4789 Encounter for other orthopedic aftercare: Secondary | ICD-10-CM | POA: Diagnosis not present

## 2018-06-01 DIAGNOSIS — S72142A Displaced intertrochanteric fracture of left femur, initial encounter for closed fracture: Secondary | ICD-10-CM | POA: Diagnosis not present

## 2018-06-01 DIAGNOSIS — Z9989 Dependence on other enabling machines and devices: Secondary | ICD-10-CM

## 2018-06-01 DIAGNOSIS — E0822 Diabetes mellitus due to underlying condition with diabetic chronic kidney disease: Secondary | ICD-10-CM | POA: Diagnosis not present

## 2018-06-01 DIAGNOSIS — S0181XA Laceration without foreign body of other part of head, initial encounter: Secondary | ICD-10-CM

## 2018-06-01 DIAGNOSIS — I252 Old myocardial infarction: Secondary | ICD-10-CM | POA: Diagnosis not present

## 2018-06-01 DIAGNOSIS — D62 Acute posthemorrhagic anemia: Secondary | ICD-10-CM | POA: Diagnosis not present

## 2018-06-01 DIAGNOSIS — F0281 Dementia in other diseases classified elsewhere with behavioral disturbance: Secondary | ICD-10-CM | POA: Diagnosis not present

## 2018-06-01 DIAGNOSIS — Z888 Allergy status to other drugs, medicaments and biological substances status: Secondary | ICD-10-CM

## 2018-06-01 DIAGNOSIS — Z955 Presence of coronary angioplasty implant and graft: Secondary | ICD-10-CM

## 2018-06-01 DIAGNOSIS — G4733 Obstructive sleep apnea (adult) (pediatric): Secondary | ICD-10-CM

## 2018-06-01 DIAGNOSIS — M1990 Unspecified osteoarthritis, unspecified site: Secondary | ICD-10-CM | POA: Diagnosis present

## 2018-06-01 DIAGNOSIS — W19XXXA Unspecified fall, initial encounter: Secondary | ICD-10-CM | POA: Diagnosis present

## 2018-06-01 DIAGNOSIS — I130 Hypertensive heart and chronic kidney disease with heart failure and stage 1 through stage 4 chronic kidney disease, or unspecified chronic kidney disease: Secondary | ICD-10-CM | POA: Diagnosis present

## 2018-06-01 DIAGNOSIS — Y92129 Unspecified place in nursing home as the place of occurrence of the external cause: Secondary | ICD-10-CM | POA: Diagnosis not present

## 2018-06-01 DIAGNOSIS — I5022 Chronic systolic (congestive) heart failure: Secondary | ICD-10-CM | POA: Diagnosis not present

## 2018-06-01 DIAGNOSIS — S72009A Fracture of unspecified part of neck of unspecified femur, initial encounter for closed fracture: Secondary | ICD-10-CM | POA: Diagnosis present

## 2018-06-01 DIAGNOSIS — I255 Ischemic cardiomyopathy: Secondary | ICD-10-CM | POA: Diagnosis present

## 2018-06-01 DIAGNOSIS — N189 Chronic kidney disease, unspecified: Secondary | ICD-10-CM | POA: Diagnosis present

## 2018-06-01 DIAGNOSIS — E785 Hyperlipidemia, unspecified: Secondary | ICD-10-CM | POA: Diagnosis present

## 2018-06-01 DIAGNOSIS — G309 Alzheimer's disease, unspecified: Secondary | ICD-10-CM | POA: Diagnosis present

## 2018-06-01 DIAGNOSIS — R296 Repeated falls: Secondary | ICD-10-CM | POA: Diagnosis not present

## 2018-06-01 DIAGNOSIS — I469 Cardiac arrest, cause unspecified: Secondary | ICD-10-CM | POA: Diagnosis not present

## 2018-06-01 DIAGNOSIS — M8580 Other specified disorders of bone density and structure, unspecified site: Secondary | ICD-10-CM | POA: Diagnosis present

## 2018-06-01 DIAGNOSIS — S72145D Nondisplaced intertrochanteric fracture of left femur, subsequent encounter for closed fracture with routine healing: Secondary | ICD-10-CM | POA: Diagnosis not present

## 2018-06-01 DIAGNOSIS — R0902 Hypoxemia: Secondary | ICD-10-CM | POA: Diagnosis not present

## 2018-06-01 DIAGNOSIS — Z794 Long term (current) use of insulin: Secondary | ICD-10-CM

## 2018-06-01 DIAGNOSIS — S2242XA Multiple fractures of ribs, left side, initial encounter for closed fracture: Secondary | ICD-10-CM | POA: Diagnosis not present

## 2018-06-01 DIAGNOSIS — Z4732 Aftercare following explantation of hip joint prosthesis: Secondary | ICD-10-CM | POA: Diagnosis not present

## 2018-06-01 DIAGNOSIS — D638 Anemia in other chronic diseases classified elsewhere: Secondary | ICD-10-CM | POA: Diagnosis not present

## 2018-06-01 DIAGNOSIS — I10 Essential (primary) hypertension: Secondary | ICD-10-CM | POA: Diagnosis not present

## 2018-06-01 DIAGNOSIS — D500 Iron deficiency anemia secondary to blood loss (chronic): Secondary | ICD-10-CM | POA: Diagnosis not present

## 2018-06-01 DIAGNOSIS — S01112A Laceration without foreign body of left eyelid and periocular area, initial encounter: Secondary | ICD-10-CM | POA: Diagnosis not present

## 2018-06-01 DIAGNOSIS — M2550 Pain in unspecified joint: Secondary | ICD-10-CM | POA: Diagnosis not present

## 2018-06-01 DIAGNOSIS — M6281 Muscle weakness (generalized): Secondary | ICD-10-CM | POA: Diagnosis not present

## 2018-06-01 DIAGNOSIS — Z7982 Long term (current) use of aspirin: Secondary | ICD-10-CM

## 2018-06-01 DIAGNOSIS — R609 Edema, unspecified: Secondary | ICD-10-CM | POA: Diagnosis not present

## 2018-06-01 DIAGNOSIS — F329 Major depressive disorder, single episode, unspecified: Secondary | ICD-10-CM | POA: Diagnosis not present

## 2018-06-01 DIAGNOSIS — S199XXA Unspecified injury of neck, initial encounter: Secondary | ICD-10-CM | POA: Diagnosis not present

## 2018-06-01 DIAGNOSIS — D649 Anemia, unspecified: Secondary | ICD-10-CM | POA: Diagnosis not present

## 2018-06-01 DIAGNOSIS — R410 Disorientation, unspecified: Secondary | ICD-10-CM | POA: Diagnosis not present

## 2018-06-01 DIAGNOSIS — I429 Cardiomyopathy, unspecified: Secondary | ICD-10-CM | POA: Diagnosis not present

## 2018-06-01 DIAGNOSIS — E119 Type 2 diabetes mellitus without complications: Secondary | ICD-10-CM

## 2018-06-01 LAB — COMPREHENSIVE METABOLIC PANEL
ALT: 14 U/L (ref 0–44)
ANION GAP: 6 (ref 5–15)
AST: 15 U/L (ref 15–41)
Albumin: 2.9 g/dL — ABNORMAL LOW (ref 3.5–5.0)
Alkaline Phosphatase: 50 U/L (ref 38–126)
BUN: 24 mg/dL — ABNORMAL HIGH (ref 8–23)
CHLORIDE: 109 mmol/L (ref 98–111)
CO2: 25 mmol/L (ref 22–32)
Calcium: 8.8 mg/dL — ABNORMAL LOW (ref 8.9–10.3)
Creatinine, Ser: 1.46 mg/dL — ABNORMAL HIGH (ref 0.44–1.00)
GFR calc non Af Amer: 30 mL/min — ABNORMAL LOW (ref >60)
GFR, EST AFRICAN AMERICAN: 35 mL/min — AB (ref >60)
Glucose, Bld: 119 mg/dL — ABNORMAL HIGH (ref 70–99)
Potassium: 3.7 mmol/L (ref 3.5–5.1)
SODIUM: 140 mmol/L (ref 135–145)
Total Bilirubin: 0.5 mg/dL (ref 0.3–1.2)
Total Protein: 6.3 g/dL — ABNORMAL LOW (ref 6.5–8.1)

## 2018-06-01 LAB — GLUCOSE, CAPILLARY
GLUCOSE-CAPILLARY: 128 mg/dL — AB (ref 70–99)
GLUCOSE-CAPILLARY: 136 mg/dL — AB (ref 70–99)
Glucose-Capillary: 140 mg/dL — ABNORMAL HIGH (ref 70–99)

## 2018-06-01 LAB — CBC WITH DIFFERENTIAL/PLATELET
BASOS PCT: 0 %
Basophils Absolute: 0 10*3/uL (ref 0.0–0.1)
Eosinophils Absolute: 0.3 10*3/uL (ref 0.0–0.7)
Eosinophils Relative: 3 %
HEMATOCRIT: 26.7 % — AB (ref 36.0–46.0)
Hemoglobin: 8.5 g/dL — ABNORMAL LOW (ref 12.0–15.0)
LYMPHS ABS: 1.3 10*3/uL (ref 0.7–4.0)
Lymphocytes Relative: 13 %
MCH: 30.5 pg (ref 26.0–34.0)
MCHC: 31.8 g/dL (ref 30.0–36.0)
MCV: 95.7 fL (ref 78.0–100.0)
MONOS PCT: 11 %
Monocytes Absolute: 1 10*3/uL (ref 0.1–1.0)
NEUTROS ABS: 7 10*3/uL (ref 1.7–7.7)
NEUTROS PCT: 73 %
Platelets: 224 10*3/uL (ref 150–400)
RBC: 2.79 MIL/uL — AB (ref 3.87–5.11)
RDW: 12.5 % (ref 11.5–15.5)
WBC: 9.5 10*3/uL (ref 4.0–10.5)

## 2018-06-01 LAB — CBG MONITORING, ED: GLUCOSE-CAPILLARY: 138 mg/dL — AB (ref 70–99)

## 2018-06-01 LAB — SAMPLE TO BLOOD BANK

## 2018-06-01 LAB — ABO/RH: ABO/RH(D): A POS

## 2018-06-01 LAB — DIGOXIN LEVEL: Digoxin Level: 0.4 ng/mL — ABNORMAL LOW (ref 0.8–2.0)

## 2018-06-01 LAB — PROTIME-INR
INR: 1.07
Prothrombin Time: 13.8 seconds (ref 11.4–15.2)

## 2018-06-01 LAB — APTT: aPTT: 30 seconds (ref 24–36)

## 2018-06-01 LAB — TYPE AND SCREEN
ABO/RH(D): A POS
Antibody Screen: NEGATIVE

## 2018-06-01 MED ORDER — MIRTAZAPINE 15 MG PO TABS
15.0000 mg | ORAL_TABLET | Freq: Every day | ORAL | Status: DC
  Administered 2018-06-01 – 2018-06-05 (×5): 15 mg via ORAL
  Filled 2018-06-01 (×5): qty 1

## 2018-06-01 MED ORDER — FERROUS SULFATE 325 (65 FE) MG PO TABS
325.0000 mg | ORAL_TABLET | Freq: Every day | ORAL | Status: DC
  Administered 2018-06-03 – 2018-06-06 (×4): 325 mg via ORAL
  Filled 2018-06-01 (×4): qty 1

## 2018-06-01 MED ORDER — MORPHINE SULFATE (PF) 2 MG/ML IV SOLN
0.5000 mg | INTRAVENOUS | Status: DC | PRN
  Administered 2018-06-01 (×2): 0.5 mg via INTRAVENOUS
  Administered 2018-06-02: 1 mg via INTRAVENOUS
  Administered 2018-06-02: 0.5 mg via INTRAVENOUS
  Filled 2018-06-01 (×4): qty 1

## 2018-06-01 MED ORDER — FENTANYL CITRATE (PF) 100 MCG/2ML IJ SOLN
50.0000 ug | Freq: Once | INTRAMUSCULAR | Status: AC
  Administered 2018-06-01: 50 ug via INTRAMUSCULAR
  Filled 2018-06-01: qty 2

## 2018-06-01 MED ORDER — INSULIN ASPART 100 UNIT/ML ~~LOC~~ SOLN
0.0000 [IU] | Freq: Three times a day (TID) | SUBCUTANEOUS | Status: DC
  Administered 2018-06-01 – 2018-06-02 (×4): 1 [IU] via SUBCUTANEOUS
  Administered 2018-06-03: 2 [IU] via SUBCUTANEOUS
  Administered 2018-06-03: 1 [IU] via SUBCUTANEOUS
  Administered 2018-06-05 – 2018-06-06 (×2): 2 [IU] via SUBCUTANEOUS

## 2018-06-01 MED ORDER — FUROSEMIDE 20 MG PO TABS
20.0000 mg | ORAL_TABLET | ORAL | Status: DC
  Administered 2018-06-04 – 2018-06-06 (×2): 20 mg via ORAL
  Filled 2018-06-01 (×3): qty 1

## 2018-06-01 MED ORDER — CHLORHEXIDINE GLUCONATE 4 % EX LIQD
60.0000 mL | Freq: Once | CUTANEOUS | Status: AC
  Administered 2018-06-02: 4 via TOPICAL
  Filled 2018-06-01: qty 60

## 2018-06-01 MED ORDER — DOCUSATE SODIUM 100 MG PO CAPS
100.0000 mg | ORAL_CAPSULE | Freq: Two times a day (BID) | ORAL | Status: DC
  Administered 2018-06-01: 100 mg via ORAL
  Filled 2018-06-01: qty 1

## 2018-06-01 MED ORDER — GUAIFENESIN ER 600 MG PO TB12
600.0000 mg | ORAL_TABLET | Freq: Two times a day (BID) | ORAL | Status: DC
  Administered 2018-06-01 – 2018-06-06 (×9): 600 mg via ORAL
  Filled 2018-06-01 (×9): qty 1

## 2018-06-01 MED ORDER — LIDOCAINE-EPINEPHRINE (PF) 2 %-1:200000 IJ SOLN
10.0000 mL | Freq: Once | INTRAMUSCULAR | Status: AC
  Administered 2018-06-01: 10 mL
  Filled 2018-06-01: qty 20

## 2018-06-01 MED ORDER — POVIDONE-IODINE 10 % EX SWAB
2.0000 "application " | Freq: Once | CUTANEOUS | Status: AC
  Administered 2018-06-02: 2 via TOPICAL

## 2018-06-01 MED ORDER — HYDROCODONE-ACETAMINOPHEN 5-325 MG PO TABS
1.0000 | ORAL_TABLET | Freq: Four times a day (QID) | ORAL | Status: DC | PRN
  Administered 2018-06-03: 1 via ORAL
  Administered 2018-06-03 (×2): 2 via ORAL
  Administered 2018-06-06: 1 via ORAL
  Filled 2018-06-01: qty 2
  Filled 2018-06-01: qty 1
  Filled 2018-06-01 (×2): qty 2
  Filled 2018-06-01: qty 1
  Filled 2018-06-01 (×2): qty 2

## 2018-06-01 MED ORDER — CARVEDILOL 3.125 MG PO TABS
3.1250 mg | ORAL_TABLET | Freq: Every day | ORAL | Status: DC
  Administered 2018-06-02 – 2018-06-06 (×5): 3.125 mg via ORAL
  Filled 2018-06-01 (×5): qty 1

## 2018-06-01 MED ORDER — SACUBITRIL-VALSARTAN 49-51 MG PO TABS
0.5000 | ORAL_TABLET | Freq: Two times a day (BID) | ORAL | Status: DC
  Administered 2018-06-01 – 2018-06-06 (×10): 0.5 via ORAL
  Filled 2018-06-01 (×10): qty 0.5

## 2018-06-01 MED ORDER — DIGOXIN 0.0625 MG HALF TABLET
0.0625 mg | ORAL_TABLET | Freq: Every day | ORAL | Status: DC
  Administered 2018-06-02 – 2018-06-06 (×5): 0.0625 mg via ORAL
  Filled 2018-06-01 (×5): qty 1

## 2018-06-01 MED ORDER — ISOSORBIDE MONONITRATE ER 30 MG PO TB24
15.0000 mg | ORAL_TABLET | Freq: Every day | ORAL | Status: DC
  Administered 2018-06-02 – 2018-06-06 (×5): 15 mg via ORAL
  Filled 2018-06-01 (×5): qty 1

## 2018-06-01 MED ORDER — HEPARIN SODIUM (PORCINE) 5000 UNIT/ML IJ SOLN
5000.0000 [IU] | Freq: Three times a day (TID) | INTRAMUSCULAR | Status: DC
  Administered 2018-06-01: 5000 [IU] via SUBCUTANEOUS
  Filled 2018-06-01: qty 1

## 2018-06-01 MED ORDER — CEFAZOLIN SODIUM-DEXTROSE 2-4 GM/100ML-% IV SOLN
2.0000 g | INTRAVENOUS | Status: AC
  Administered 2018-06-02: 2 g via INTRAVENOUS
  Filled 2018-06-01 (×2): qty 100

## 2018-06-01 MED ORDER — HYDRALAZINE HCL 20 MG/ML IJ SOLN: 10.0000 mg | INTRAMUSCULAR | Status: DC | PRN

## 2018-06-01 MED ORDER — POTASSIUM CHLORIDE CRYS ER 10 MEQ PO TBCR
10.0000 meq | EXTENDED_RELEASE_TABLET | Freq: Every day | ORAL | Status: DC
  Administered 2018-06-04 – 2018-06-06 (×3): 10 meq via ORAL
  Filled 2018-06-01 (×4): qty 1

## 2018-06-01 MED ORDER — FENTANYL CITRATE (PF) 100 MCG/2ML IJ SOLN
50.0000 ug | Freq: Once | INTRAMUSCULAR | Status: AC
  Administered 2018-06-01: 50 ug via INTRAVENOUS
  Filled 2018-06-01: qty 2

## 2018-06-01 MED ORDER — PANTOPRAZOLE SODIUM 40 MG PO TBEC
40.0000 mg | DELAYED_RELEASE_TABLET | Freq: Every day | ORAL | Status: DC
  Administered 2018-06-02 – 2018-06-06 (×5): 40 mg via ORAL
  Filled 2018-06-01 (×5): qty 1

## 2018-06-01 NOTE — Clinical Social Work Note (Signed)
Patient is transferring to [facility].  Please reconsult CSW once patient is admitted to [facility].    LCSW signing off.    Dayvin Aber, Clydene Pugh, LCSW

## 2018-06-01 NOTE — Progress Notes (Signed)
Patient came from [facility]. Alert and oriented x 2. Husband and sisters at beside. Reports no pain at this time. Vital signs stable. RN will continue to monitor.

## 2018-06-01 NOTE — ED Provider Notes (Signed)
[facility] EMERGENCY DEPARTMENT Provider Note   CSN: [id number] Arrival date & time: 06/01/18  0434  Time seen 05:00 AM   History   Chief Complaint Chief Complaint  Patient presents with  . Fall    HPI Amanda SHERRINA ZAUGG is a 84 y.o. female.  HPI patient states she was standing up when she turned around and got her feet tangled and she fell.  She cannot tell me where she was going or what she was trying to do.  She complains of pain in her left leg.  Patient has a laceration also noted near her left eyebrow.  She denies any pain in her extremities, chest, or abdomen.  She states her head was hurting but not now  PCP Rosita Fire, MD   Past Medical History:  Diagnosis Date  . CAD (coronary artery disease)    a. 04/2010 MI DES x 2 to LCX;  b. 05/2014 Cath: EF 35%, patent LCX stents w/ 70% stenosis in small AV groove LCX, Diag 50-60, RCA 50-60p->Med Rx.  . Cerebral aneurysm   . Chronic combined systolic (congestive) and diastolic (congestive) heart failure ([place])    a. 01/2015 Echo: EF 30-35%, sev inflat HK, Gr 1DD, mild AI.  Marland Kitchen Diverticulosis of colon   . Essential hypertension   . Frequent urination   . GIB (gastrointestinal bleeding)    a. 2002 Diverticular bleed.  . Hyperlipidemia   . Ischemic cardiomyopathy    a. 01/2015 Echo: EF 30-35%.  . Osteoarthritis   . Osteopenia   . Skin disorder   . Sleep apnea    cpap therapy  . Stroke ([place]) 2002  . T12 compression fracture ([place])    a. 04/2015.  Marland Kitchen Thrombus   . Type II diabetes mellitus ([place])   . Visual disorder     Patient Active Problem List   Diagnosis Date Noted  . HCAP (healthcare-associated pneumonia) 05/22/2018  . Elevated troponin 05/22/2018  . Sinus bradycardia 04/01/2018  . Anemia 03/31/2018  . Traumatic subarachnoid hemorrhage with loss of consciousness of 30 minutes or less ([place])   . Anemia of chronic disease 09/06/2016  . Gastroenteritis 07/22/2016  . Hip pain, chronic 06/03/2016  . Dyspnea  11/15/2015  . Acute respiratory failure with hypoxia ([place]) 11/15/2015  . CHF exacerbation ([place]) 11/15/2015  . Pain in the chest   . Chronic systolic CHF (congestive heart failure) ([place])   . Ischemic cardiomyopathy   . T12 compression fracture ([place])   . Intertrigo 02/28/2015  . Actinic keratoses 02/28/2015  . Noncompliance with diet and medication regimen 02/28/2015  . LBP (low back pain) 02/13/2015  . Acute on chronic renal insufficiency 02/04/2015  . Cardiomyopathy, ischemic-EF 30-35% by echo 01/31/15 02/04/2015  . Chronic kidney disease stage III 02/03/2015  . Acute on chronic combined systolic and diastolic CHF, NYHA class 3 ([place]) 01/30/2015  . Cystitis 10/28/2014  . Insomnia 10/28/2014  . Alzheimer's disease ([place]) 10/28/2014  . Falls frequently 10/12/2014  . Leg weakness, bilateral 10/12/2014  . OSA on CPAP 09/05/2014  . Rapid palpitations 06/07/2014  . Acute chest pain 06/06/2014  . Chest pain 06/06/2014  . Thoracic back pain after fall  04/13/2013  . Stress at home 01/26/2013  . GERD (gastroesophageal reflux disease) 01/25/2013  . SOB (shortness of breath) 08/28/2011  . Abnormal EKG, marked new ant TWI this adm with negative Troponins,08/18/11 08/20/2011  . CAD S/P CFX PCI Sept 2011 08/18/2011  . Sleep apnea, cpap had been stopped secondary to "sinus issues"  08/18/2011  . [name] deficiency 03/20/2010  . Diabetes mellitus ([place]) 01/07/2008  . Hyperlipidemia 01/07/2008  . Essential hypertension 04/25/2007  . DIVERTICULOSIS, COLON 04/25/2007  . OSTEOARTHRITIS 04/25/2007  . Disorder of bone and cartilage 04/25/2007  . DIVERTICULITIS, HX OF 04/25/2007    Past Surgical History:  Procedure Laterality Date  . ABDOMINAL HYSTERECTOMY  1980   no bso  . APPENDECTOMY    . CARDIAC CATHETERIZATION  07/2011   LAD OK, D1 80%, CFX 30% w/ patent stent, RCA 50-60%, EF 40%  . CARDIAC CATHETERIZATION    . CORONARY ANGIOPLASTY WITH STENT PLACEMENT  05/11/2010   stent CFX  . DOPPLER  ECHOCARDIOGRAPHY    . LEFT HEART CATHETERIZATION WITH CORONARY ANGIOGRAM N/A 08/18/2011   Procedure: LEFT HEART CATHETERIZATION WITH CORONARY ANGIOGRAM;  Surgeon: Wellington Hampshire, MD;  Location: [facility] CATH LAB;  Service: Cardiovascular;  Laterality: N/A;  . LEFT HEART CATHETERIZATION WITH CORONARY ANGIOGRAM N/A 06/08/2014   Procedure: LEFT HEART CATHETERIZATION WITH CORONARY ANGIOGRAM;  Surgeon: Troy Sine, MD;  Location: [facility] CATH LAB;  Service: Cardiovascular;  Laterality: N/A;  . myocardial perfusion study    . THROMBECTOMY    . TONSILLECTOMY       OB History   None      Home Medications    Prior to Admission medications   Medication Sig Start Date End Date Taking? Authorizing Provider  acetaminophen (TYLENOL) 325 MG tablet Take 325 mg by mouth every 12 (twelve) hours as needed for mild pain or headache.    [provider]  albuterol (PROVENTIL HFA;VENTOLIN HFA) 108 (90 Base) MCG/ACT inhaler Inhale 2 puffs into the lungs every 6 (six) hours as needed for wheezing or shortness of breath. 05/25/18   Kathie Dike, MD  amoxicillin-clavulanate (AUGMENTIN) 500-125 MG tablet Take 1 tablet (500 mg total) by mouth 2 (two) times daily. 05/25/18   Kathie Dike, MD  aspirin 81 MG EC tablet Take 81 mg by mouth daily.      [provider]  Blood Glucose Monitoring Suppl (ACCU-CHEK AVIVA PLUS) w/Device KIT Use to check blood sugars twice a day Dx. E11.9 09/30/16   Plotnikov, Evie Lacks, MD  carvedilol (COREG) 3.125 MG tablet Take 1 tablet (3.125 mg total) by mouth daily. 04/06/18   Barton Dubois, MD  digoxin (LANOXIN) 0.125 MG tablet Take 0.5 tablets (0.0625 mg total) by mouth daily. 05/11/18   Johnson, Clanford L, MD  furosemide (LASIX) 20 MG tablet Take 1 tablet (20 mg total) by mouth every other day. 05/28/18   Almyra Deforest, PA  glipiZIDE (GLUCOTROL) 5 MG tablet Take 0.5 tablets (2.5 mg total) by mouth 2 (two) times daily before a meal. 04/06/18 04/06/19  Barton Dubois, MD    guaiFENesin (MUCINEX) 600 MG 12 hr tablet Take 1 tablet (600 mg total) by mouth 2 (two) times daily. 05/25/18 05/25/19  Kathie Dike, MD  isosorbide mononitrate (IMDUR) 30 MG 24 hr tablet Take 0.5 tablets (15 mg total) by mouth daily. 04/06/18   Barton Dubois, MD  mirtazapine (REMERON) 15 MG tablet Take 1 tablet by mouth at bedtime. 05/08/18   [provider]  potassium chloride SA (K-DUR,KLOR-CON) 20 MEQ tablet Take 1 tablet (20 mEq total) by mouth every other day. 05/28/18   Almyra Deforest, PA  sacubitril-valsartan (ENTRESTO) 49-51 MG Take 0.5 tablets by mouth 2 (two) times daily. 04/06/18   Barton Dubois, MD    Family History Family History  Problem Relation Age of Onset  . Hypertension Other   .  CAD Unknown        father    Social History Social History   Tobacco Use  . Smoking status: Never Smoker  . Smokeless tobacco: Never Used  Substance Use Topics  . Alcohol use: No  . Drug use: No  lives in [place]   Allergies   Clopidogrel bisulfate; Lipitor [atorvastatin]; Namenda [memantine hcl]; and Lisinopril   Review of Systems Review of Systems  All other systems reviewed and are negative.    Physical Exam Updated Vital Signs BP (!) 177/78   Pulse 83   Temp 97.9 F (36.6 C) (Oral)   Resp 20   Ht '5\' 5"'$  (1.651 m)   Wt 57.8 kg   SpO2 97%   BMI 21.20 kg/m   Vital signs normal except for hypertension   Physical Exam  Constitutional: She appears well-developed and well-nourished.  Non-toxic appearance. She does not appear ill. No distress.  Elderly female who is awake and cooperative  HENT:  Head: Normocephalic.  Right Ear: External ear normal.  Left Ear: External ear normal.  Nose: Nose normal. No mucosal edema or rhinorrhea.  Mouth/Throat: Oropharynx is clear and moist and mucous membranes are normal. No dental abscesses or uvula swelling.  Patient has a 2 cm linear laceration in her lateral left eyebrow without active bleeding.  Eyes: Pupils are equal,  round, and reactive to light. Conjunctivae and EOM are normal.  Neck: Normal range of motion and full passive range of motion without pain. Neck supple.  Cardiovascular: Normal rate, regular rhythm and normal heart sounds. Exam reveals no gallop and no friction rub.  No murmur heard. Pulmonary/Chest: Effort normal and breath sounds normal. No respiratory distress. She has no wheezes. She has no rhonchi. She has no rales. She exhibits no tenderness and no crepitus.  She had no pain to stressing her ribs.  Abdominal: Soft. Normal appearance and bowel sounds are normal. She exhibits no distension. There is no tenderness. There is no rebound and no guarding.  Musculoskeletal: Normal range of motion. She exhibits edema, tenderness and deformity.  Moves all extremities well except for her left lower extremity.  She has shortening of the left lower extremity and internal rotation.  She has tenderness and swelling in her left hip area.  Neurological: She is alert. She has normal strength. No cranial nerve deficit.  Cooperative  Skin: Skin is warm, dry and intact. No rash noted. No erythema. No pallor.  Psychiatric: She has a normal mood and affect. Her speech is normal and behavior is normal. Her mood appears not anxious.  Nursing note and vitals reviewed.       ED Treatments / Results  Labs (all labs ordered are listed, but only abnormal results are displayed) Results for orders placed or performed during the hospital encounter of 06/01/18  Comprehensive metabolic panel  Result Value Ref Range   Sodium 140 135 - 145 mmol/L   Potassium 3.7 3.5 - 5.1 mmol/L   Chloride 109 98 - 111 mmol/L   CO2 25 22 - 32 mmol/L   Glucose, Bld 119 (H) 70 - 99 mg/dL   BUN 24 (H) 8 - 23 mg/dL   Creatinine, Ser 1.46 (H) 0.44 - 1.00 mg/dL   Calcium 8.8 (L) 8.9 - 10.3 mg/dL   Total Protein 6.3 (L) 6.5 - 8.1 g/dL   Albumin 2.9 (L) 3.5 - 5.0 g/dL   AST 15 15 - 41 U/L   ALT 14 0 - 44 U/L   Alkaline Phosphatase  50  38 - 126 U/L   Total Bilirubin 0.5 0.3 - 1.2 mg/dL   GFR calc non Af Amer 30 (L) >60 mL/min   GFR calc Af Amer 35 (L) >60 mL/min   Anion gap 6 5 - 15  CBC with Differential  Result Value Ref Range   WBC 9.5 4.0 - 10.5 K/uL   RBC 2.79 (L) 3.87 - 5.11 MIL/uL   Hemoglobin 8.5 (L) 12.0 - 15.0 g/dL   HCT 26.7 (L) 36.0 - 46.0 %   MCV 95.7 78.0 - 100.0 fL   MCH 30.5 26.0 - 34.0 pg   MCHC 31.8 30.0 - 36.0 g/dL   RDW 12.5 11.5 - 15.5 %   Platelets 224 150 - 400 K/uL   Neutrophils Relative % 73 %   Neutro Abs 7.0 1.7 - 7.7 K/uL   Lymphocytes Relative 13 %   Lymphs Abs 1.3 0.7 - 4.0 K/uL   Monocytes Relative 11 %   Monocytes Absolute 1.0 0.1 - 1.0 K/uL   Eosinophils Relative 3 %   Eosinophils Absolute 0.3 0.0 - 0.7 K/uL   Basophils Relative 0 %   Basophils Absolute 0.0 0.0 - 0.1 K/uL  Protime-INR  Result Value Ref Range   Prothrombin Time 13.8 11.4 - 15.2 seconds   INR 1.07   APTT  Result Value Ref Range   aPTT 30 24 - 36 seconds  Digoxin level  Result Value Ref Range   Digoxin Level 0.4 (L) 0.8 - 2.0 ng/mL   Laboratory interpretation all normal except subtherapeutic Dilantin level, stable anemia, stable renal insufficiency    EKG EKG Interpretation  Date/Time:  Monday June 01 2018 04:43:26 EDT Ventricular Rate:  81 PR Interval:    QRS Duration: 113 QT Interval:  387 QTC Calculation: 450 R Axis:   -43 Text Interpretation:  Accelerated junctional rhythm Borderline IVCD with LAD Anterior infarct, old Abnormal T, consider ischemia, lateral leads No significant change since last tracing 22 May 2018 Confirmed by Rolland Porter [phone]) on 06/01/2018 5:22:52 AM   Radiology Dg Chest 1 View  Result Date: 06/01/2018 CLINICAL DATA:  Fall tonight with left hip pain.  Hip fracture. EXAM: CHEST  1 VIEW COMPARISON:  Radiograph 05/22/2018.  CT 10/29/2015 FINDINGS: The cardiomediastinal contours are normal. Mild cardiomegaly with aortic atherosclerosis. Pulmonary vasculature is normal. No  consolidation, pleural effusion, or pneumothorax. Left lateral sixth rib fracture of uncertain acuity. No acute osseous abnormalities are seen. IMPRESSION: 1. Indeterminate left lateral sixth rib fracture. No pulmonary complication. 2. Mild cardiomegaly with Aortic Atherosclerosis (ICD10-I70.0). Electronically Signed   By: Keith Rake M.D.   On: 06/01/2018 06:30    Ct Head Wo Contrast Ct Cervical Spine Wo Contrast  Result Date: 06/01/2018 CLINICAL DATA:  Fall in nursing home striking head. Laceration to eyebrow. EXAM: CT HEAD WITHOUT CONTRAST CT CERVICAL SPINE WITHOUT CONTRAST TECHNIQUE: Multidetector CT imaging of the head and cervical spine was performed following the standard protocol without intravenous contrast. Multiplanar CT image reconstructions of the cervical spine were also generated. COMPARISON:  Head CT 03/31/2018. Head and cervical spine CT 01/22/2017 FINDINGS: CT HEAD FINDINGS Brain: No intracranial hemorrhage. No subdural or extra-axial fluid collection. Generalized atrophy with ex vacuo dilatation of the right lateral ventricle. Multifocal remote infarcts in the right MCA distribution, remote right basal gangliar infarcts. Remote left cerebellar infarct. The basilar cisterns are patent. Vascular: Atherosclerosis of skullbase vasculature without hyperdense vessel or abnormal calcification. Skull: No fracture or focal lesion. Sinuses/Orbits: New opacification of sphenoid  sinus, ethmoid sinuses, and maxillary sinuses with fluid levels. New partial opacification of right mastoid air cells. No visualized fracture. Other: None. CT CERVICAL SPINE FINDINGS Alignment: Trace anterolisthesis of C4 on C5 and C7 on T1, unchanged from prior exam. No traumatic subluxation. Skull base and vertebrae: No acute fracture. Vertebral body heights are maintained. The dens and skull base are intact. Soft tissues and spinal canal: No prevertebral fluid or swelling. No visible canal hematoma. Disc levels: Disc  space narrowing and endplate spurring most prominent at C5-C6 and C6-C7. Multilevel facet arthropathy. Upper chest: Biapical pleuroparenchymal scarring. No acute findings. Other: None. IMPRESSION: 1.  No acute intracranial abnormality.  No skull fracture. 2. Unchanged atrophy and multifocal remote ischemia. 3. Near pan sinusitis, new since CT 2 months ago. 4. Degenerative change in the cervical spine without acute fracture. Electronically Signed   By: Keith Rake M.D.   On: 06/01/2018 06:17    Dg Hip Unilat With Pelvis 2-3 Views Left  Result Date: 06/01/2018 CLINICAL DATA:  Fall tonight with left hip pain. EXAM: DG HIP (WITH OR WITHOUT PELVIS) 2-3V LEFT COMPARISON:  None. FINDINGS: Comminuted displaced intertrochanteric left femur fracture with apex lateral and anterior angulation. Femoral head remains seated. No additional fracture of the pelvis. The pubic rami are intact. IMPRESSION: Comminuted, angulated, and displaced intertrochanteric left femur fracture. Electronically Signed   By: Keith Rake M.D.   On: 06/01/2018 06:28    Procedures .Marland KitchenLaceration Repair Date/Time: 06/01/2018 7:47 AM Performed by: Rolland Porter, MD Authorized by: Rolland Porter, MD   Consent:    Consent obtained:  Verbal   Consent given by:  Patient Anesthesia (see MAR for exact dosages):    Anesthesia method:  Local infiltration   Local anesthetic:  Lidocaine 2% WITH epi Laceration details:    Location:  Face   Face location:  L eyebrow   Length (cm):  2   Laceration depth: subcutaneous. Repair type:    Repair type:  Simple Pre-procedure details:    Preparation:  Patient was prepped and draped in usual sterile fashion and imaging obtained to evaluate for foreign bodies Exploration:    Hemostasis achieved with:  Direct pressure   Wound exploration: entire depth of wound probed and visualized     Wound extent: no foreign bodies/material noted     Contaminated: no   Treatment:    Area cleansed with:  Betadine  and saline   Amount of cleaning:  Standard Skin repair:    Repair method:  Sutures   Suture size:  4-0   Suture material:  Prolene   Suture technique:  Simple interrupted Approximation:    Approximation:  Close Post-procedure details:    Dressing:  Antibiotic ointment and sterile dressing   Patient tolerance of procedure:  Tolerated well, no immediate complications   (including critical care time)  Medications Ordered in ED Medications  fentaNYL (SUBLIMAZE) injection 50 mcg (has no administration in time range)  fentaNYL (SUBLIMAZE) injection 50 mcg (50 mcg Intramuscular Given 06/01/18 0526)  lidocaine-EPINEPHrine (XYLOCAINE W/EPI) 2 %-1:200000 (PF) injection 10 mL (10 mLs Infiltration Given by Other 06/01/18 6967)     Initial Impression / Assessment and Plan / ED Course  I have reviewed the triage vital signs and the nursing notes.  Pertinent labs & imaging results that were available during my care of the patient were reviewed by me and considered in my medical decision making (see chart for details).     Nurses were having trouble starting her IV,  she was given fentanyl IM.  Laboratory testing was ordered and x-rays were obtained of her left hip.  Since she hit her head CT of the head and cervical spine were also ordered.  Recheck at 7 AM I informed patient that she did have a fracture of her hip.  She states she does not have an orthopedist.  I offered to give her more pain medicine but she states she did need it at this time.  She was advised to let the nurse know when she needs more.  Unfortunately we do not have an orthopedist here today.  I have put in a call to the orthopedist in [place].  When I went in to recheck the patient at 7:50 she states she now needs more pain medication which was ordered.  I informed the patient that she would most likely be transferred to [facility] or [facility] for repair of her hip.  08:20 AM orthopedics hasn't called back yet, Dr Sabra Heck  will talk to ortho and hospitalist about admission.   Final Clinical Impressions(s) / ED Diagnoses   Final diagnoses:  Closed displaced intertrochanteric fracture of left femur, initial encounter [facility])  Facial laceration, initial encounter  Fall at nursing home, initial encounter    Plan admission  Rolland Porter, MD, Barbette Or, MD 06/01/18 [phone]

## 2018-06-01 NOTE — Progress Notes (Signed)
Mary Cook, BSW with [facility] gave verbal consent for surgery 06/01/2018.

## 2018-06-01 NOTE — ED Notes (Signed)
D[employer] Case Worker (power of attorney)   [phone] ext [phone]

## 2018-06-01 NOTE — ED Triage Notes (Signed)
Pt is a resident of [facility] who fell tonight, pt has laceration to left eyebrow area, shortening to left leg,

## 2018-06-01 NOTE — H&P (Signed)
History and Physical  Mary Cook ZHY:[id number] DOB: 1926-06-13 DOA: 06/01/2018  Referring physician: Sabra Heck MD PCP: Rosita Fire, MD   Chief Complaint: Fall   HPI: Mary Cook is a 84 y.o. female with chronic combined systolic and diastolic heart failure, cardiomyopathy, HTN, CAD, type 2 DM, OSA and other history detailed below presented to ED after a fall at [facility] living facility.  She sustained a small laceration above left eyebrow which was repaired in ED.  patient states she was standing up when she turned around and got her feet tangled and she fell.  She cannot tell me where she was going or what she was trying to do.  She complains of pain in her left leg especially when moving her left leg.  X-rays reveal that she has suffered an intertrochanteric fracture of the left femur.  Dr. Percell Miller with orthopedics who has requested that patient be transferred to [facility] so that she can have surgery tomorrow.  She needs to be NPO after midnight.    Review of Systems: All systems reviewed and apart from history of presenting illness, are negative.  Past Medical History:  Diagnosis Date  . CAD (coronary artery disease)    a. 04/2010 MI DES x 2 to LCX;  b. 05/2014 Cath: EF 35%, patent LCX stents w/ 70% stenosis in small AV groove LCX, Diag 50-60, RCA 50-60p->Med Rx.  . Cerebral aneurysm   . Chronic combined systolic (congestive) and diastolic (congestive) heart failure ([place])    a. 01/2015 Echo: EF 30-35%, sev inflat HK, Gr 1DD, mild AI.  Marland Kitchen Diverticulosis of colon   . Essential hypertension   . Frequent urination   . GIB (gastrointestinal bleeding)    a. 2002 Diverticular bleed.  . Hyperlipidemia   . Ischemic cardiomyopathy    a. 01/2015 Echo: EF 30-35%.  . Osteoarthritis   . Osteopenia   . Skin disorder   . Sleep apnea    cpap therapy  . Stroke ([place]) 2002  . T12 compression fracture ([place])    a. 04/2015.  Marland Kitchen Thrombus   . Type II diabetes mellitus ([place])   .  Visual disorder    Past Surgical History:  Procedure Laterality Date  . ABDOMINAL HYSTERECTOMY  1980   no bso  . APPENDECTOMY    . CARDIAC CATHETERIZATION  07/2011   LAD OK, D1 80%, CFX 30% w/ patent stent, RCA 50-60%, EF 40%  . CARDIAC CATHETERIZATION    . CORONARY ANGIOPLASTY WITH STENT PLACEMENT  05/11/2010   stent CFX  . DOPPLER ECHOCARDIOGRAPHY    . LEFT HEART CATHETERIZATION WITH CORONARY ANGIOGRAM N/A 08/18/2011   Procedure: LEFT HEART CATHETERIZATION WITH CORONARY ANGIOGRAM;  Surgeon: Wellington Hampshire, MD;  Location: [facility] CATH LAB;  Service: Cardiovascular;  Laterality: N/A;  . LEFT HEART CATHETERIZATION WITH CORONARY ANGIOGRAM N/A 06/08/2014   Procedure: LEFT HEART CATHETERIZATION WITH CORONARY ANGIOGRAM;  Surgeon: Troy Sine, MD;  Location: [facility] CATH LAB;  Service: Cardiovascular;  Laterality: N/A;  . myocardial perfusion study    . THROMBECTOMY    . TONSILLECTOMY     Social History:  reports that she has never smoked. She has never used smokeless tobacco. She reports that she does not drink alcohol or use drugs.  Allergies  Allergen Reactions  . Clopidogrel Bisulfate Nausea And Vomiting    Patient is not aware of this allergy  . Lipitor [Atorvastatin] Other (See Comments)    Weak legs  . Namenda [Memantine Hcl]  N/v/d  . Lisinopril Cough    Patient is not aware of this allergy    Family History  Problem Relation Age of Onset  . Hypertension Other   . CAD Unknown        father    Prior to Admission medications   Medication Sig Start Date End Date Taking? Authorizing Provider  acetaminophen (TYLENOL) 325 MG tablet Take 325 mg by mouth every 12 (twelve) hours as needed for mild pain or headache.    [provider]  albuterol (PROVENTIL HFA;VENTOLIN HFA) 108 (90 Base) MCG/ACT inhaler Inhale 2 puffs into the lungs every 6 (six) hours as needed for wheezing or shortness of breath. 05/25/18   Kathie Dike, MD  aspirin 81 MG EC tablet Take 81 mg by mouth  daily.      [provider]  Blood Glucose Monitoring Suppl (ACCU-CHEK AVIVA PLUS) w/Device KIT Use to check blood sugars twice a day Dx. E11.9 09/30/16   Plotnikov, Evie Lacks, MD  carvedilol (COREG) 3.125 MG tablet Take 1 tablet (3.125 mg total) by mouth daily. 04/06/18   Barton Dubois, MD  digoxin (LANOXIN) 0.125 MG tablet Take 0.5 tablets (0.0625 mg total) by mouth daily. 05/11/18   Ardena Gangl L, MD  furosemide (LASIX) 20 MG tablet Take 1 tablet (20 mg total) by mouth every other day. 05/28/18   Almyra Deforest, PA  glipiZIDE (GLUCOTROL) 5 MG tablet Take 0.5 tablets (2.5 mg total) by mouth 2 (two) times daily before a meal. 04/06/18 04/06/19  Barton Dubois, MD  guaiFENesin (MUCINEX) 600 MG 12 hr tablet Take 1 tablet (600 mg total) by mouth 2 (two) times daily. 05/25/18 05/25/19  Kathie Dike, MD  isosorbide mononitrate (IMDUR) 30 MG 24 hr tablet Take 0.5 tablets (15 mg total) by mouth daily. 04/06/18   Barton Dubois, MD  mirtazapine (REMERON) 15 MG tablet Take 1 tablet by mouth at bedtime. 05/08/18   [provider]  potassium chloride SA (K-DUR,KLOR-CON) 20 MEQ tablet Take 1 tablet (20 mEq total) by mouth every other day. 05/28/18   Almyra Deforest, PA  sacubitril-valsartan (ENTRESTO) 49-51 MG Take 0.5 tablets by mouth 2 (two) times daily. 04/06/18   Barton Dubois, MD   Physical Exam: Vitals:   06/01/18 0900 06/01/18 0915 06/01/18 0930 06/01/18 1000  BP: (!) 171/71  (!) 133/92 (!) 170/72  Pulse: 83 86 85   Resp: 17 18 (!) 21 19  Temp:      TempSrc:      SpO2: 95% 95% 98%   Weight:      Height:       Constitutional: She appears well-developed and well-nourished.  Non-toxic appearance. She does not appear ill. No distress.  Elderly female who is awake and cooperative  HENT: Normocephalic. She has a repaired laceration over left eyebrow.  Mouth/Throat: Oropharynx is clear and moist and mucous membranes are normal.   Patient has a 2 cm linear laceration in her lateral left eyebrow  without active bleeding.  Eyes: Pupils are equal, round, and reactive to light. Conjunctivae and EOM are normal.  Cardiovascular: normal s1,s2 sounds. Exam reveals no gallop and no friction rub.  Pulmonary/Chest: Effort normal and breath sounds normal. No respiratory distress. She has no wheezes. She has no rhonchi. She had no pain to stressing her ribs.  Abdominal: Soft. Normal appearance and bowel sounds are normal. She exhibits no distension. There is no tenderness. There is no rebound and no guarding.  Musculoskeletal: Normal range of motion. She exhibits  edema, tenderness and deformity. Moves all extremities well except for her left lower extremity which is slightly internally rotated.  She has shortening of the left lower extremity and internal rotation.  She has tenderness and swelling in her left hip area.  Neurological: She is alert. She has normal strength. No cranial nerve deficit. Cooperative  Skin: Skin is warm, dry and intact. No rash noted. No erythema. No pallor.  Psychiatric: She has a normal mood and affect. Her speech is normal and behavior is normal.   Labs on Admission:  Basic Metabolic Panel: Recent Labs  Lab 05/28/18 1055 06/01/18 0546  NA 142 140  K 4.6 3.7  CL 106 109  CO2 22 25  GLUCOSE 132* 119*  BUN 28 24*  CREATININE 1.61* 1.46*  CALCIUM 9.4 8.8*   Liver Function Tests: Recent Labs  Lab 06/01/18 0546  AST 15  ALT 14  ALKPHOS 50  BILITOT 0.5  PROT 6.3*  ALBUMIN 2.9*   No results for input(s): LIPASE, AMYLASE in the last 168 hours. No results for input(s): AMMONIA in the last 168 hours. CBC: Recent Labs  Lab 06/01/18 0546  WBC 9.5  NEUTROABS 7.0  HGB 8.5*  HCT 26.7*  MCV 95.7  PLT 224   Cardiac Enzymes: No results for input(s): CKTOTAL, CKMB, CKMBINDEX, TROPONINI in the last 168 hours.  BNP (last 3 results) No results for input(s): PROBNP in the last 8760 hours. CBG: Recent Labs  Lab 05/25/18 1131  GLUCAP 113*    Radiological  Exams on Admission: Dg Chest 1 View  Result Date: 06/01/2018 CLINICAL DATA:  Fall tonight with left hip pain.  Hip fracture. EXAM: CHEST  1 VIEW COMPARISON:  Radiograph 05/22/2018.  CT 10/29/2015 FINDINGS: The cardiomediastinal contours are normal. Mild cardiomegaly with aortic atherosclerosis. Pulmonary vasculature is normal. No consolidation, pleural effusion, or pneumothorax. Left lateral sixth rib fracture of uncertain acuity. No acute osseous abnormalities are seen. IMPRESSION: 1. Indeterminate left lateral sixth rib fracture. No pulmonary complication. 2. Mild cardiomegaly with Aortic Atherosclerosis (ICD10-I70.0). Electronically Signed   By: Keith Rake M.D.   On: 06/01/2018 06:30   Ct Head Wo Contrast  Result Date: 06/01/2018 CLINICAL DATA:  Fall in nursing home striking head. Laceration to eyebrow. EXAM: CT HEAD WITHOUT CONTRAST CT CERVICAL SPINE WITHOUT CONTRAST TECHNIQUE: Multidetector CT imaging of the head and cervical spine was performed following the standard protocol without intravenous contrast. Multiplanar CT image reconstructions of the cervical spine were also generated. COMPARISON:  Head CT 03/31/2018. Head and cervical spine CT 01/22/2017 FINDINGS: CT HEAD FINDINGS Brain: No intracranial hemorrhage. No subdural or extra-axial fluid collection. Generalized atrophy with ex vacuo dilatation of the right lateral ventricle. Multifocal remote infarcts in the right MCA distribution, remote right basal gangliar infarcts. Remote left cerebellar infarct. The basilar cisterns are patent. Vascular: Atherosclerosis of skullbase vasculature without hyperdense vessel or abnormal calcification. Skull: No fracture or focal lesion. Sinuses/Orbits: New opacification of sphenoid sinus, ethmoid sinuses, and maxillary sinuses with fluid levels. New partial opacification of right mastoid air cells. No visualized fracture. Other: None. CT CERVICAL SPINE FINDINGS Alignment: Trace anterolisthesis of C4 on C5  and C7 on T1, unchanged from prior exam. No traumatic subluxation. Skull base and vertebrae: No acute fracture. Vertebral body heights are maintained. The dens and skull base are intact. Soft tissues and spinal canal: No prevertebral fluid or swelling. No visible canal hematoma. Disc levels: Disc space narrowing and endplate spurring most prominent at C5-C6 and C6-C7. Multilevel facet arthropathy.  Upper chest: Biapical pleuroparenchymal scarring. No acute findings. Other: None. IMPRESSION: 1.  No acute intracranial abnormality.  No skull fracture. 2. Unchanged atrophy and multifocal remote ischemia. 3. Near pan sinusitis, new since CT 2 months ago. 4. Degenerative change in the cervical spine without acute fracture. Electronically Signed   By: Keith Rake M.D.   On: 06/01/2018 06:17   Ct Cervical Spine Wo Contrast  Result Date: 06/01/2018 CLINICAL DATA:  Fall in nursing home striking head. Laceration to eyebrow. EXAM: CT HEAD WITHOUT CONTRAST CT CERVICAL SPINE WITHOUT CONTRAST TECHNIQUE: Multidetector CT imaging of the head and cervical spine was performed following the standard protocol without intravenous contrast. Multiplanar CT image reconstructions of the cervical spine were also generated. COMPARISON:  Head CT 03/31/2018. Head and cervical spine CT 01/22/2017 FINDINGS: CT HEAD FINDINGS Brain: No intracranial hemorrhage. No subdural or extra-axial fluid collection. Generalized atrophy with ex vacuo dilatation of the right lateral ventricle. Multifocal remote infarcts in the right MCA distribution, remote right basal gangliar infarcts. Remote left cerebellar infarct. The basilar cisterns are patent. Vascular: Atherosclerosis of skullbase vasculature without hyperdense vessel or abnormal calcification. Skull: No fracture or focal lesion. Sinuses/Orbits: New opacification of sphenoid sinus, ethmoid sinuses, and maxillary sinuses with fluid levels. New partial opacification of right mastoid air cells. No  visualized fracture. Other: None. CT CERVICAL SPINE FINDINGS Alignment: Trace anterolisthesis of C4 on C5 and C7 on T1, unchanged from prior exam. No traumatic subluxation. Skull base and vertebrae: No acute fracture. Vertebral body heights are maintained. The dens and skull base are intact. Soft tissues and spinal canal: No prevertebral fluid or swelling. No visible canal hematoma. Disc levels: Disc space narrowing and endplate spurring most prominent at C5-C6 and C6-C7. Multilevel facet arthropathy. Upper chest: Biapical pleuroparenchymal scarring. No acute findings. Other: None. IMPRESSION: 1.  No acute intracranial abnormality.  No skull fracture. 2. Unchanged atrophy and multifocal remote ischemia. 3. Near pan sinusitis, new since CT 2 months ago. 4. Degenerative change in the cervical spine without acute fracture. Electronically Signed   By: Keith Rake M.D.   On: 06/01/2018 06:17   Dg Hip Unilat With Pelvis 2-3 Views Left  Result Date: 06/01/2018 CLINICAL DATA:  Fall tonight with left hip pain. EXAM: DG HIP (WITH OR WITHOUT PELVIS) 2-3V LEFT COMPARISON:  None. FINDINGS: Comminuted displaced intertrochanteric left femur fracture with apex lateral and anterior angulation. Femoral head remains seated. No additional fracture of the pelvis. The pubic rami are intact. IMPRESSION: Comminuted, angulated, and displaced intertrochanteric left femur fracture. Electronically Signed   By: Keith Rake M.D.   On: 06/01/2018 06:28   EKG: Independently reviewed. Accelerated junctional rhythm  Assessment/Plan Principal Problem:   Hip fracture (HCC) Active Problems:   Diabetes mellitus ([place])   Essential hypertension   GERD (gastroesophageal reflux disease)   OSA on CPAP   Falls frequently   Alzheimer's disease ([place])   Chronic kidney disease stage III   Cardiomyopathy, ischemic-EF 30-35% by echo 10/02/04   Chronic systolic CHF (congestive heart failure) (HCC)   Anemia of chronic disease  1. Acute  left hip fracture - The patient requires operative management for this fracture.  Dr. Percell Miller was consulted and will see the patient at [facility] and is planning to take patient to the OR on 10/8.  The patient will be started on the hip fracture orderset bundle.  NPO after midnight orders placed. IV pain management ordered.  Monitor hemoglobin.  Type and screen for blood if needed.  2. OSA on CPAP - arrange for nightly CPAP.  3. Anemia of chronic disease with likely acute blood loss anemia - will monitor hemoglobin. Type and screen.  4. GERD -  protonix 40 mg daily ordered.  5. Chronic systolic CHF - patient is compensated, will keep her on her cardiac medications including entresto and lasix.  Her most recent echo from 09/19 reveals EF 30-35%.  6. Stage 3 CKD - stable creatinine, following.  7. Type 2 diabetes mellitus - will provide supplemental sliding scale coverage while in hospital.  Follow CBG.  8. Frequent falls - Obtain PT eval when surgically stabilized.    DVT Prophylaxis: subcutaneous heparin Code Status: DNR  Family Communication: patient   Disposition Plan: transfer to [facility]    Time spent: 6  mins  Irwin Brakeman, MD [facility] Pager [phone]  If 7PM-7AM, please contact night-coverage [email or web address] Password [email or web address] 06/01/2018, 10:27 AM

## 2018-06-01 NOTE — ED Notes (Signed)
[facility] arrived to transport pt to [facility].  

## 2018-06-02 ENCOUNTER — Inpatient Hospital Stay (HOSPITAL_COMMUNITY): Payer: Medicare HMO | Admitting: Anesthesiology

## 2018-06-02 ENCOUNTER — Inpatient Hospital Stay (HOSPITAL_COMMUNITY): Payer: Medicare HMO

## 2018-06-02 ENCOUNTER — Encounter (HOSPITAL_COMMUNITY)
Admission: EM | Disposition: A | Discharge status: Other Acute Inpatient Hospital | Payer: Self-pay | Source: Home / Self Care | Attending: Internal Medicine

## 2018-06-02 ENCOUNTER — Encounter (HOSPITAL_COMMUNITY): Payer: Self-pay

## 2018-06-02 DIAGNOSIS — S72142A Displaced intertrochanteric fracture of left femur, initial encounter for closed fracture: Secondary | ICD-10-CM | POA: Diagnosis present

## 2018-06-02 HISTORY — PX: FEMUR IM NAIL: SHX1597

## 2018-06-02 LAB — CBC
HCT: 26.5 % — ABNORMAL LOW (ref 36.0–46.0)
Hemoglobin: 8.5 g/dL — ABNORMAL LOW (ref 12.0–15.0)
MCH: 30.2 pg (ref 26.0–34.0)
MCHC: 32.1 g/dL (ref 30.0–36.0)
MCV: 94.3 fL (ref 80.0–100.0)
Platelets: 208 10*3/uL (ref 150–400)
RBC: 2.81 MIL/uL — ABNORMAL LOW (ref 3.87–5.11)
RDW: 12.7 % (ref 11.5–15.5)
WBC: 7.7 10*3/uL (ref 4.0–10.5)

## 2018-06-02 LAB — SURGICAL PCR SCREEN
MRSA, PCR: NEGATIVE
STAPHYLOCOCCUS AUREUS: NEGATIVE

## 2018-06-02 LAB — BASIC METABOLIC PANEL
ANION GAP: 11 (ref 5–15)
BUN: 27 mg/dL — ABNORMAL HIGH (ref 8–23)
CALCIUM: 8.9 mg/dL (ref 8.9–10.3)
CO2: 23 mmol/L (ref 22–32)
Chloride: 107 mmol/L (ref 98–111)
Creatinine, Ser: 1.6 mg/dL — ABNORMAL HIGH (ref 0.44–1.00)
GFR calc Af Amer: 32 mL/min — ABNORMAL LOW (ref >60)
GFR calc non Af Amer: 27 mL/min — ABNORMAL LOW (ref >60)
GLUCOSE: 122 mg/dL — AB (ref 70–99)
Potassium: 4.6 mmol/L (ref 3.5–5.1)
Sodium: 141 mmol/L (ref 135–145)

## 2018-06-02 LAB — GLUCOSE, CAPILLARY
GLUCOSE-CAPILLARY: 103 mg/dL — AB (ref 70–99)
Glucose-Capillary: 125 mg/dL — ABNORMAL HIGH (ref 70–99)
Glucose-Capillary: 88 mg/dL (ref 70–99)
Glucose-Capillary: 140 mg/dL — ABNORMAL HIGH (ref 70–99)

## 2018-06-02 LAB — VITAMIN D 25 HYDROXY (VIT D DEFICIENCY, FRACTURES): VIT D 25 HYDROXY: 21.1 ng/mL — AB (ref 30.0–100.0)

## 2018-06-02 LAB — PREPARE RBC (CROSSMATCH)

## 2018-06-02 LAB — TROPONIN I: Troponin I: 0.03 ng/mL (ref <0.03)

## 2018-06-02 SURGERY — INSERTION, INTRAMEDULLARY ROD, FEMUR
Anesthesia: Spinal | Laterality: Left

## 2018-06-02 MED ORDER — 0.9 % SODIUM CHLORIDE (POUR BTL) OPTIME
TOPICAL | Status: DC | PRN
  Administered 2018-06-02: 1000 mL

## 2018-06-02 MED ORDER — ONDANSETRON HCL 4 MG PO TABS: 4.0000 mg | ORAL_TABLET | Freq: Four times a day (QID) | ORAL | Status: DC | PRN

## 2018-06-02 MED ORDER — FENTANYL CITRATE (PF) 100 MCG/2ML IJ SOLN: 25.0000 ug | INTRAMUSCULAR | Status: DC | PRN

## 2018-06-02 MED ORDER — FENTANYL CITRATE (PF) 100 MCG/2ML IJ SOLN
INTRAMUSCULAR | Status: AC
  Filled 2018-06-02: qty 2

## 2018-06-02 MED ORDER — SODIUM CHLORIDE 0.9 % IV SOLN
INTRAVENOUS | Status: DC | PRN
  Administered 2018-06-02: 50 ug/min via INTRAVENOUS

## 2018-06-02 MED ORDER — ACETAMINOPHEN 325 MG PO TABS
650.0000 mg | ORAL_TABLET | Freq: Four times a day (QID) | ORAL | Status: DC | PRN
  Filled 2018-06-02: qty 2

## 2018-06-02 MED ORDER — EPHEDRINE 5 MG/ML INJ
INTRAVENOUS | Status: AC
  Filled 2018-06-02: qty 10

## 2018-06-02 MED ORDER — SODIUM CHLORIDE 0.9 % IV SOLN
INTRAVENOUS | Status: AC
  Filled 2018-06-02: qty 500000

## 2018-06-02 MED ORDER — DOCUSATE SODIUM 100 MG PO CAPS
100.0000 mg | ORAL_CAPSULE | Freq: Two times a day (BID) | ORAL | Status: DC
  Administered 2018-06-02 – 2018-06-06 (×8): 100 mg via ORAL
  Filled 2018-06-02 (×8): qty 1

## 2018-06-02 MED ORDER — PROPOFOL 500 MG/50ML IV EMUL
INTRAVENOUS | Status: DC | PRN
  Administered 2018-06-02: 10 ug/kg/min via INTRAVENOUS

## 2018-06-02 MED ORDER — LACTATED RINGERS IV SOLN
INTRAVENOUS | Status: DC
  Administered 2018-06-02: 18:00:00 via INTRAVENOUS

## 2018-06-02 MED ORDER — ENOXAPARIN SODIUM 30 MG/0.3ML ~~LOC~~ SOLN
30.0000 mg | SUBCUTANEOUS | Status: DC
  Administered 2018-06-03: 30 mg via SUBCUTANEOUS
  Filled 2018-06-02: qty 0.3

## 2018-06-02 MED ORDER — ONDANSETRON HCL 4 MG/2ML IJ SOLN
INTRAMUSCULAR | Status: DC | PRN
  Administered 2018-06-02: 4 mg via INTRAVENOUS

## 2018-06-02 MED ORDER — CEFAZOLIN SODIUM-DEXTROSE 2-4 GM/100ML-% IV SOLN
2.0000 g | Freq: Four times a day (QID) | INTRAVENOUS | Status: AC
  Administered 2018-06-02 – 2018-06-03 (×2): 2 g via INTRAVENOUS
  Filled 2018-06-02 (×2): qty 100

## 2018-06-02 MED ORDER — LACTATED RINGERS IV SOLN
INTRAVENOUS | Status: DC
  Administered 2018-06-02: 12:00:00 via INTRAVENOUS

## 2018-06-02 MED ORDER — BUPIVACAINE-EPINEPHRINE (PF) 0.5% -1:200000 IJ SOLN
INTRAMUSCULAR | Status: AC
  Filled 2018-06-02: qty 30

## 2018-06-02 MED ORDER — ACETAMINOPHEN 500 MG PO TABS
500.0000 mg | ORAL_TABLET | Freq: Four times a day (QID) | ORAL | Status: AC
  Administered 2018-06-02 – 2018-06-03 (×4): 500 mg via ORAL
  Filled 2018-06-02 (×4): qty 1

## 2018-06-02 MED ORDER — FENTANYL CITRATE (PF) 100 MCG/2ML IJ SOLN
INTRAMUSCULAR | Status: DC | PRN
  Administered 2018-06-02 (×2): 25 ug via INTRAVENOUS

## 2018-06-02 MED ORDER — HYDROCODONE-ACETAMINOPHEN 5-325 MG PO TABS: 1.0000 | ORAL_TABLET | Freq: Four times a day (QID) | ORAL | 0 refills | Status: AC | PRN

## 2018-06-02 MED ORDER — EPHEDRINE SULFATE-NACL 50-0.9 MG/10ML-% IV SOSY
PREFILLED_SYRINGE | INTRAVENOUS | Status: DC | PRN
  Administered 2018-06-02 (×2): 10 mg via INTRAVENOUS
  Administered 2018-06-02: 5 mg via INTRAVENOUS

## 2018-06-02 MED ORDER — METOCLOPRAMIDE HCL 5 MG/ML IJ SOLN: 5.0000 mg | Freq: Three times a day (TID) | INTRAMUSCULAR | Status: DC | PRN

## 2018-06-02 MED ORDER — ENOXAPARIN SODIUM 30 MG/0.3ML ~~LOC~~ SOLN: 30.0000 mg | SUBCUTANEOUS | 0 refills | Status: DC

## 2018-06-02 MED ORDER — METOCLOPRAMIDE HCL 5 MG PO TABS: 5.0000 mg | ORAL_TABLET | Freq: Three times a day (TID) | ORAL | Status: DC | PRN

## 2018-06-02 MED ORDER — FLEET ENEMA 7-19 GM/118ML RE ENEM: 1.0000 | ENEMA | Freq: Once | RECTAL | Status: DC | PRN

## 2018-06-02 MED ORDER — BISACODYL 10 MG RE SUPP: 10.0000 mg | Freq: Every day | RECTAL | Status: DC | PRN

## 2018-06-02 MED ORDER — ONDANSETRON HCL 4 MG/2ML IJ SOLN: 4.0000 mg | Freq: Four times a day (QID) | INTRAMUSCULAR | Status: DC | PRN

## 2018-06-02 MED ORDER — POLYETHYLENE GLYCOL 3350 17 G PO PACK: 17.0000 g | PACK | Freq: Every day | ORAL | Status: DC | PRN

## 2018-06-02 MED ORDER — ONDANSETRON HCL 4 MG/2ML IJ SOLN: 4.0000 mg | Freq: Once | INTRAMUSCULAR | Status: DC | PRN

## 2018-06-02 MED ORDER — PROPOFOL 10 MG/ML IV BOLUS
INTRAVENOUS | Status: DC | PRN
  Administered 2018-06-02: 20 mg via INTRAVENOUS

## 2018-06-02 MED ORDER — DEXAMETHASONE SODIUM PHOSPHATE 10 MG/ML IJ SOLN
INTRAMUSCULAR | Status: DC | PRN
  Administered 2018-06-02: 6 mg via INTRAVENOUS

## 2018-06-02 MED ORDER — SODIUM CHLORIDE 0.9% IV SOLUTION: Freq: Once | INTRAVENOUS | Status: DC

## 2018-06-02 SURGICAL SUPPLY — 54 items
BAG SPEC THK2 15X12 ZIP CLS (MISCELLANEOUS) ×1
BAG ZIPLOCK 12X15 (MISCELLANEOUS) ×3 IMPLANT
BIT DRILL AO GAMMA 4.2X180 (BIT) ×2 IMPLANT
BLADE SURG 15 STRL LF DISP TIS (BLADE) ×1 IMPLANT
BLADE SURG 15 STRL SS (BLADE) ×3
BNDG COHESIVE 4X5 TAN STRL (GAUZE/BANDAGES/DRESSINGS) ×1 IMPLANT
CLOSURE WOUND 1/2 X4 (GAUZE/BANDAGES/DRESSINGS) ×1
COVER MAYO STAND STRL (DRAPES) ×2 IMPLANT
COVER SURGICAL LIGHT HANDLE (MISCELLANEOUS) ×3 IMPLANT
COVER WAND RF STERILE (DRAPES) ×2 IMPLANT
DRAPE C-ARM 42X120 X-RAY (DRAPES) ×2 IMPLANT
DRAPE ORTHO SPLIT 77X108 STRL (DRAPES)
DRAPE SHEET LG 3/4 BI-LAMINATE (DRAPES) ×1 IMPLANT
DRAPE STERI IOBAN 125X83 (DRAPES) ×3 IMPLANT
DRAPE SURG ORHT 6 SPLT 77X108 (DRAPES) ×3 IMPLANT
DRSG MEPILEX BORDER 4X4 (GAUZE/BANDAGES/DRESSINGS) ×2 IMPLANT
DRSG PAD ABDOMINAL 8X10 ST (GAUZE/BANDAGES/DRESSINGS) ×1 IMPLANT
DURAPREP 26ML APPLICATOR (WOUND CARE) ×3 IMPLANT
ELECT REM PT RETURN 15FT ADLT (MISCELLANEOUS) ×3 IMPLANT
GAUZE SPONGE 4X4 12PLY STRL (GAUZE/BANDAGES/DRESSINGS) ×3 IMPLANT
GLOVE BIO SURGEON STRL SZ7.5 (GLOVE) ×3 IMPLANT
GLOVE BIO SURGEON STRL SZ8.5 (GLOVE) ×1 IMPLANT
GLOVE BIOGEL PI IND STRL 7.0 (GLOVE) IMPLANT
GLOVE BIOGEL PI IND STRL 7.5 (GLOVE) IMPLANT
GLOVE BIOGEL PI IND STRL 8 (GLOVE) IMPLANT
GLOVE BIOGEL PI INDICATOR 7.0 (GLOVE) ×4
GLOVE BIOGEL PI INDICATOR 7.5 (GLOVE) ×4
GLOVE BIOGEL PI INDICATOR 8 (GLOVE) ×4
GLOVE SURG ORTHO 8.0 STRL STRW (GLOVE) ×1 IMPLANT
GOWN SPEC L4 XLG W/TWL (GOWN DISPOSABLE) ×4 IMPLANT
GOWN STRL REUS W/TWL LRG LVL3 (GOWN DISPOSABLE) ×2 IMPLANT
GUIDEROD T2 3X1000 (ROD) ×2 IMPLANT
K-WIRE  3.2X450M STR (WIRE) ×8
K-WIRE 3.2X450M STR (WIRE) ×4
KIT BASIN OR (CUSTOM PROCEDURE TRAY) ×3 IMPLANT
KIT NAIL LONG 10X360MMX125 (Nail) ×2 IMPLANT
KWIRE 3.2X450M STR (WIRE) IMPLANT
LEGGING LITHOTOMY PAIR STRL (DRAPES) ×1 IMPLANT
MANIFOLD NEPTUNE II (INSTRUMENTS) ×3 IMPLANT
PACK GENERAL/GYN (CUSTOM PROCEDURE TRAY) ×3 IMPLANT
POSITIONER SURGICAL ARM (MISCELLANEOUS) ×3 IMPLANT
REAMER SHAFT BIXCUT (INSTRUMENTS) ×4 IMPLANT
SCREW LAG GAMMA 3 TI 10.5X90MM (Screw) ×2 IMPLANT
SCREW LOCKING T2 F/T  5MMX40MM (Screw) ×2 IMPLANT
SCREW LOCKING T2 F/T 5MMX40MM (Screw) IMPLANT
SPONGE LAP 4X18 RFD (DISPOSABLE) ×1 IMPLANT
STAPLER VISISTAT (STAPLE) ×1 IMPLANT
STRIP CLOSURE SKIN 1/2X4 (GAUZE/BANDAGES/DRESSINGS) ×1 IMPLANT
SUT MNCRL AB 4-0 PS2 18 (SUTURE) ×2 IMPLANT
SUT VIC AB 0 CT1 27 (SUTURE) ×3
SUT VIC AB 0 CT1 27XBRD ANTBC (SUTURE) ×2 IMPLANT
SUT VIC AB 2-0 CT1 27 (SUTURE) ×3
SUT VIC AB 2-0 CT1 27XBRD (SUTURE) ×2 IMPLANT
TOWEL OR 17X26 10 PK STRL BLUE (TOWEL DISPOSABLE) ×2 IMPLANT

## 2018-06-02 NOTE — H&P (View-Only) (Signed)
ORTHOPAEDIC CONSULTATION  REQUESTING PHYSICIAN: Murlean Iba, MD  Chief Complaint: Left hip pain, fall  HPI: Mary Cook is a 84 y.o. female who complains of left hip pain after a fall at [facility] facility yesterday.  She does not remember falling nor the circumstances surrounding same.  He presented to the emergency department where a laceration above her left eyebrow was repaired and x-rays of her hip showed a closed displaced left intertrochanteric femur fracture.  Orthopedics was consulted for evaluation. Today her pain is controlled.  She reports being previously ambulatory without aid and hopes to be able to walk again independently.   Past Medical History:  Diagnosis Date  . CAD (coronary artery disease)    a. 04/2010 MI DES x 2 to LCX;  b. 05/2014 Cath: EF 35%, patent LCX stents w/ 70% stenosis in small AV groove LCX, Diag 50-60, RCA 50-60p->Med Rx.  . Cerebral aneurysm   . Chronic combined systolic (congestive) and diastolic (congestive) heart failure ([place])    a. 01/2015 Echo: EF 30-35%, sev inflat HK, Gr 1DD, mild AI.  Marland Kitchen Diverticulosis of colon   . Essential hypertension   . Frequent urination   . GIB (gastrointestinal bleeding)    a. 2002 Diverticular bleed.  . Hyperlipidemia   . Ischemic cardiomyopathy    a. 01/2015 Echo: EF 30-35%.  . Osteoarthritis   . Osteopenia   . Skin disorder   . Sleep apnea    cpap therapy  . Stroke ([place]) 2002  . T12 compression fracture ([place])    a. 04/2015.  Marland Kitchen Thrombus   . Type II diabetes mellitus ([place])   . Visual disorder    Past Surgical History:  Procedure Laterality Date  . ABDOMINAL HYSTERECTOMY  1980   no bso  . APPENDECTOMY    . CARDIAC CATHETERIZATION  07/2011   LAD OK, D1 80%, CFX 30% w/ patent stent, RCA 50-60%, EF 40%  . CARDIAC CATHETERIZATION    . CORONARY ANGIOPLASTY WITH STENT PLACEMENT  05/11/2010   stent CFX  . DOPPLER ECHOCARDIOGRAPHY    . LEFT HEART CATHETERIZATION WITH CORONARY  ANGIOGRAM N/A 08/18/2011   Procedure: LEFT HEART CATHETERIZATION WITH CORONARY ANGIOGRAM;  Surgeon: Wellington Hampshire, MD;  Location: [facility] CATH LAB;  Service: Cardiovascular;  Laterality: N/A;  . LEFT HEART CATHETERIZATION WITH CORONARY ANGIOGRAM N/A 06/08/2014   Procedure: LEFT HEART CATHETERIZATION WITH CORONARY ANGIOGRAM;  Surgeon: Troy Sine, MD;  Location: [facility] CATH LAB;  Service: Cardiovascular;  Laterality: N/A;  . myocardial perfusion study    . THROMBECTOMY    . TONSILLECTOMY     Social History   Socioeconomic History  . Marital status: Married    Spouse name: Not on file  . Number of children: Not on file  . Years of education: Not on file  . Highest education level: Not on file  Occupational History  . Occupation: Retired  Scientific laboratory technician  . Financial resource strain: Not on file  . Food insecurity:    Worry: Not on file    Inability: Not on file  . Transportation needs:    Medical: Not on file    Non-medical: Not on file  Tobacco Use  . Smoking status: Never Smoker  . Smokeless tobacco: Never Used  Substance and Sexual Activity  . Alcohol use: No  . Drug use: No  . Sexual activity: Not on file  Lifestyle  . Physical activity:    Days per week: Not on file  Minutes per session: Not on file  . Stress: Not on file  Relationships  . Social connections:    Talks on phone: Not on file    Gets together: Not on file    Attends religious service: Not on file    Active member of club or organization: Not on file    Attends meetings of clubs or organizations: Not on file    Relationship status: Not on file  Other Topics Concern  . Not on file  Social History Narrative  . Not on file   Family History  Problem Relation Age of Onset  . Hypertension Other   . CAD Unknown        father   Allergies  Allergen Reactions  . Clopidogrel Bisulfate Nausea And Vomiting    Patient is not aware of this allergy  . Lipitor [Atorvastatin] Other (See Comments)    Weak legs    . Namenda [Memantine Hcl]     N/v/d  . Lisinopril Cough    Patient is not aware of this allergy   Prior to Admission medications   Medication Sig Start Date End Date Taking? Authorizing Provider  acetaminophen (TYLENOL) 325 MG tablet Take 325 mg by mouth every 12 (twelve) hours as needed for mild pain or headache.   Yes [provider]  albuterol (PROVENTIL HFA;VENTOLIN HFA) 108 (90 Base) MCG/ACT inhaler Inhale 2 puffs into the lungs every 6 (six) hours as needed for wheezing or shortness of breath. 05/25/18  Yes Kathie Dike, MD  aspirin 81 MG EC tablet Take 81 mg by mouth daily.     Yes [provider]  Blood Glucose Monitoring Suppl (ACCU-CHEK AVIVA PLUS) w/Device KIT Use to check blood sugars twice a day Dx. E11.9 09/30/16  Yes Plotnikov, Evie Lacks, MD  carvedilol (COREG) 3.125 MG tablet Take 1 tablet (3.125 mg total) by mouth daily. 04/06/18  Yes Barton Dubois, MD  digoxin (LANOXIN) 0.125 MG tablet Take 0.5 tablets (0.0625 mg total) by mouth daily. 05/11/18  Yes Johnson, Clanford L, MD  docusate sodium (COLACE) 100 MG capsule Take 100 mg by mouth at bedtime.   Yes [provider]  furosemide (LASIX) 20 MG tablet Take 1 tablet (20 mg total) by mouth every other day. 05/28/18  Yes Almyra Deforest, PA  glipiZIDE (GLUCOTROL) 5 MG tablet Take 0.5 tablets (2.5 mg total) by mouth 2 (two) times daily before a meal. 04/06/18 04/06/19 Yes Barton Dubois, MD  guaiFENesin (MUCINEX) 600 MG 12 hr tablet Take 1 tablet (600 mg total) by mouth 2 (two) times daily. 05/25/18 05/25/19 Yes Memon, Jolaine Artist, MD  guaiFENesin-dextromethorphan (ROBITUSSIN DM) 100-10 MG/5ML syrup Take 5 mLs by mouth 4 (four) times daily as needed for cough.   Yes [provider]  isosorbide mononitrate (IMDUR) 30 MG 24 hr tablet Take 0.5 tablets (15 mg total) by mouth daily. 04/06/18  Yes Barton Dubois, MD  mirtazapine (REMERON) 15 MG tablet Take 15 mg by mouth at bedtime.  05/08/18  Yes [provider]  potassium chloride SA (K-DUR,KLOR-CON) 20 MEQ tablet Take 1 tablet (20 mEq total) by mouth every other day. 05/28/18  Yes Meng, Isaac Laud, PA  sacubitril-valsartan (ENTRESTO) 49-51 MG Take 0.5 tablets by mouth 2 (two) times daily. 04/06/18  Yes Barton Dubois, MD  amoxicillin-clavulanate (AUGMENTIN) 500-125 MG tablet Take 1 tablet by mouth 2 (two) times daily.    [provider]   Dg Chest 1 View  Result Date: 06/01/2018 CLINICAL DATA:  Fall tonight with  left hip pain.  Hip fracture. EXAM: CHEST  1 VIEW COMPARISON:  Radiograph 05/22/2018.  CT 10/29/2015 FINDINGS: The cardiomediastinal contours are normal. Mild cardiomegaly with aortic atherosclerosis. Pulmonary vasculature is normal. No consolidation, pleural effusion, or pneumothorax. Left lateral sixth rib fracture of uncertain acuity. No acute osseous abnormalities are seen. IMPRESSION: 1. Indeterminate left lateral sixth rib fracture. No pulmonary complication. 2. Mild cardiomegaly with Aortic Atherosclerosis (ICD10-I70.0). Electronically Signed   By: Keith Rake M.D.   On: 06/01/2018 06:30   Ct Head Wo Contrast  Result Date: 06/01/2018 CLINICAL DATA:  Fall in nursing home striking head. Laceration to eyebrow. EXAM: CT HEAD WITHOUT CONTRAST CT CERVICAL SPINE WITHOUT CONTRAST TECHNIQUE: Multidetector CT imaging of the head and cervical spine was performed following the standard protocol without intravenous contrast. Multiplanar CT image reconstructions of the cervical spine were also generated. COMPARISON:  Head CT 03/31/2018. Head and cervical spine CT 01/22/2017 FINDINGS: CT HEAD FINDINGS Brain: No intracranial hemorrhage. No subdural or extra-axial fluid collection. Generalized atrophy with ex vacuo dilatation of the right lateral ventricle. Multifocal remote infarcts in the right MCA distribution, remote right basal gangliar infarcts. Remote left cerebellar infarct. The basilar cisterns are patent. Vascular: Atherosclerosis of skullbase  vasculature without hyperdense vessel or abnormal calcification. Skull: No fracture or focal lesion. Sinuses/Orbits: New opacification of sphenoid sinus, ethmoid sinuses, and maxillary sinuses with fluid levels. New partial opacification of right mastoid air cells. No visualized fracture. Other: None. CT CERVICAL SPINE FINDINGS Alignment: Trace anterolisthesis of C4 on C5 and C7 on T1, unchanged from prior exam. No traumatic subluxation. Skull base and vertebrae: No acute fracture. Vertebral body heights are maintained. The dens and skull base are intact. Soft tissues and spinal canal: No prevertebral fluid or swelling. No visible canal hematoma. Disc levels: Disc space narrowing and endplate spurring most prominent at C5-C6 and C6-C7. Multilevel facet arthropathy. Upper chest: Biapical pleuroparenchymal scarring. No acute findings. Other: None. IMPRESSION: 1.  No acute intracranial abnormality.  No skull fracture. 2. Unchanged atrophy and multifocal remote ischemia. 3. Near pan sinusitis, new since CT 2 months ago. 4. Degenerative change in the cervical spine without acute fracture. Electronically Signed   By: Keith Rake M.D.   On: 06/01/2018 06:17   Ct Cervical Spine Wo Contrast  Result Date: 06/01/2018 CLINICAL DATA:  Fall in nursing home striking head. Laceration to eyebrow. EXAM: CT HEAD WITHOUT CONTRAST CT CERVICAL SPINE WITHOUT CONTRAST TECHNIQUE: Multidetector CT imaging of the head and cervical spine was performed following the standard protocol without intravenous contrast. Multiplanar CT image reconstructions of the cervical spine were also generated. COMPARISON:  Head CT 03/31/2018. Head and cervical spine CT 01/22/2017 FINDINGS: CT HEAD FINDINGS Brain: No intracranial hemorrhage. No subdural or extra-axial fluid collection. Generalized atrophy with ex vacuo dilatation of the right lateral ventricle. Multifocal remote infarcts in the right MCA distribution, remote right basal gangliar infarcts.  Remote left cerebellar infarct. The basilar cisterns are patent. Vascular: Atherosclerosis of skullbase vasculature without hyperdense vessel or abnormal calcification. Skull: No fracture or focal lesion. Sinuses/Orbits: New opacification of sphenoid sinus, ethmoid sinuses, and maxillary sinuses with fluid levels. New partial opacification of right mastoid air cells. No visualized fracture. Other: None. CT CERVICAL SPINE FINDINGS Alignment: Trace anterolisthesis of C4 on C5 and C7 on T1, unchanged from prior exam. No traumatic subluxation. Skull base and vertebrae: No acute fracture. Vertebral body heights are maintained. The dens and skull base are intact. Soft tissues and spinal canal: No prevertebral fluid or  swelling. No visible canal hematoma. Disc levels: Disc space narrowing and endplate spurring most prominent at C5-C6 and C6-C7. Multilevel facet arthropathy. Upper chest: Biapical pleuroparenchymal scarring. No acute findings. Other: None. IMPRESSION: 1.  No acute intracranial abnormality.  No skull fracture. 2. Unchanged atrophy and multifocal remote ischemia. 3. Near pan sinusitis, new since CT 2 months ago. 4. Degenerative change in the cervical spine without acute fracture. Electronically Signed   By: Keith Rake M.D.   On: 06/01/2018 06:17   Dg Hip Unilat With Pelvis 2-3 Views Left  Result Date: 06/01/2018 CLINICAL DATA:  Fall tonight with left hip pain. EXAM: DG HIP (WITH OR WITHOUT PELVIS) 2-3V LEFT COMPARISON:  None. FINDINGS: Comminuted displaced intertrochanteric left femur fracture with apex lateral and anterior angulation. Femoral head remains seated. No additional fracture of the pelvis. The pubic rami are intact. IMPRESSION: Comminuted, angulated, and displaced intertrochanteric left femur fracture. Electronically Signed   By: Keith Rake M.D.   On: 06/01/2018 06:28    Positive ROS: All other systems have been reviewed and were otherwise negative with the exception of those  mentioned in the HPI and as above.  Objective: Labs cbc Recent Labs    06/01/18 0546  WBC 9.5  HGB 8.5*  HCT 26.7*  PLT 224    Labs inflam No results for input(s): CRP in the last 72 hours.  Invalid input(s): ESR  Labs coag Recent Labs    06/01/18 0546  INR 1.07    Recent Labs    06/01/18 0546  NA 140  K 3.7  CL 109  CO2 25  GLUCOSE 119*  BUN 24*  CREATININE 1.46*  CALCIUM 8.8*    Physical Exam: Vitals:   06/01/18 2054 06/02/18 0508  BP: (!) 163/71 (!) 151/56  Pulse: 86 77  Resp: (!) 21 (!) 21  Temp: 98.2 F (36.8 C) 98 F (36.7 C)  SpO2: 95% 98%   General: Alert, no acute distress.  Supine in bed on arrival.  Calm and interactive.  Responds appropriately to questions. Mental status: Alert and Oriented to person. Neurologic: Speech Clear and organized, no gross focal findings or movement disorder appreciated. Respiratory: No cyanosis, no use of accessory musculature Cardiovascular: No pedal edema GI: Abdomen is soft and non-tender, non-distended. Skin: Warm and dry.  No lesions in the area of chief complaint. Extremities: Warm and well perfused w/o edema Psychiatric: Patient is competent for consent with normal mood and affect  MUSCULOSKELETAL:  Left hip pain with range of motion and palpation laterally.  No ecchymosis or lesions.  Dorsiflexion, plantarflexion, EHL, FHL intact.  Sensation grossly intact distally. Other extremities are atraumatic with painless ROM and NVI.  Assessment / Plan: Principal Problem:   Closed intertrochanteric fracture of left femur (HCC) Active Problems:   Diabetes mellitus ([place])   Essential hypertension   GERD (gastroesophageal reflux disease)   OSA on CPAP   Falls frequently   Alzheimer's disease ([place])   Chronic kidney disease stage III   Cardiomyopathy, ischemic-EF 30-35% by echo 03/27/94   Chronic systolic CHF (congestive heart failure) (HCC)   Anemia of chronic disease   Hip fracture (HCC)   Closed  displaced left intertrochanteric femur fracture Plan for Operative fixation by Dr. Alain Marion today -NPO  -Medicine team to admit and perform pre-op clearance -PT/OT post op -Smart Wick Foley okay prn for comfort   The risks benefits and alternatives of the procedure were discussed with the patient including but not limited to infection, bleeding,  nerve injury, the need for revision surgery, blood clots, cardiopulmonary complications, morbidity, mortality, among others.  The patient verbalizes understanding and a goal of mobilizing independently.  She wishes to proceed with surgery.    Weightbearing: Bedrest.  Will amend post op. VTE prophylaxis:  SCDs.  Pain control:  Minimize narcotics if able. Follow-up plan: Will follow in acute inpatient post-op phase.  Plan for outpatient follow up with Dr. Alain Marion in about 2 weeks. Contact information:  Edmonia Lynch MD, Roxan Hockey PA-C  Prudencio Burly III PA-C 06/02/2018 6:41 AM

## 2018-06-02 NOTE — Consult Note (Signed)
ORTHOPAEDIC CONSULTATION  REQUESTING PHYSICIAN: Murlean Iba, MD  Chief Complaint: Left hip pain, fall  HPI: Mary Cook is a 84 y.o. female who complains of left hip pain after a fall at [facility] yesterday.  She does not remember falling nor the circumstances surrounding same.  He presented to the emergency department where a laceration above her left eyebrow was repaired and x-rays of her hip showed a closed displaced left intertrochanteric femur fracture.  Orthopedics was consulted for evaluation. Today her pain is controlled.  She reports being previously ambulatory without aid and hopes to be able to walk again independently.   Past Medical History:  Diagnosis Date  . CAD (coronary artery disease)    a. 04/2010 MI DES x 2 to LCX;  b. 05/2014 Cath: EF 35%, patent LCX stents w/ 70% stenosis in small AV groove LCX, Diag 50-60, RCA 50-60p->Med Rx.  . Cerebral aneurysm   . Chronic combined systolic (congestive) and diastolic (congestive) heart failure ([place])    a. 01/2015 Echo: EF 30-35%, sev inflat HK, Gr 1DD, mild AI.  Marland Kitchen Diverticulosis of colon   . Essential hypertension   . Frequent urination   . GIB (gastrointestinal bleeding)    a. 2002 Diverticular bleed.  . Hyperlipidemia   . Ischemic cardiomyopathy    a. 01/2015 Echo: EF 30-35%.  . Osteoarthritis   . Osteopenia   . Skin disorder   . Sleep apnea    cpap therapy  . Stroke ([place]) 2002  . T12 compression fracture ([place])    a. 04/2015.  Marland Kitchen Thrombus   . Type II diabetes mellitus ([place])   . Visual disorder    Past Surgical History:  Procedure Laterality Date  . ABDOMINAL HYSTERECTOMY  1980   no bso  . APPENDECTOMY    . CARDIAC CATHETERIZATION  07/2011   LAD OK, D1 80%, CFX 30% w/ patent stent, RCA 50-60%, EF 40%  . CARDIAC CATHETERIZATION    . CORONARY ANGIOPLASTY WITH STENT PLACEMENT  05/11/2010   stent CFX  . DOPPLER ECHOCARDIOGRAPHY    . LEFT HEART CATHETERIZATION WITH CORONARY  ANGIOGRAM N/A 08/18/2011   Procedure: LEFT HEART CATHETERIZATION WITH CORONARY ANGIOGRAM;  Surgeon: Wellington Hampshire, MD;  Location: [facility] CATH [facility];  Service: Cardiovascular;  Laterality: N/A;  . LEFT HEART CATHETERIZATION WITH CORONARY ANGIOGRAM N/A 06/08/2014   Procedure: LEFT HEART CATHETERIZATION WITH CORONARY ANGIOGRAM;  Surgeon: Troy Sine, MD;  Location: [facility] CATH LAB;  Service: Cardiovascular;  Laterality: N/A;  . myocardial perfusion study    . THROMBECTOMY    . TONSILLECTOMY     Social History   Socioeconomic History  . Marital status: Married    Spouse name: Not on file  . Number of children: Not on file  . Years of education: Not on file  . Highest education level: Not on file  Occupational History  . Occupation: Retired  Scientific laboratory technician  . Financial resource strain: Not on file  . Food insecurity:    Worry: Not on file    Inability: Not on file  . Transportation needs:    Medical: Not on file    Non-medical: Not on file  Tobacco Use  . Smoking status: Never Smoker  . Smokeless tobacco: Never Used  Substance and Sexual Activity  . Alcohol use: No  . Drug use: No  . Sexual activity: Not on file  Lifestyle  . Physical activity:    Days per week: Not on file  Minutes per session: Not on file  . Stress: Not on file  Relationships  . Social connections:    Talks on phone: Not on file    Gets together: Not on file    Attends religious service: Not on file    Active member of club or organization: Not on file    Attends meetings of clubs or organizations: Not on file    Relationship status: Not on file  Other Topics Concern  . Not on file  Social History Narrative  . Not on file   Family History  Problem Relation Age of Onset  . Hypertension Other   . CAD Unknown        father   Allergies  Allergen Reactions  . Clopidogrel Bisulfate Nausea And Vomiting    Patient is not aware of this allergy  . Lipitor [Atorvastatin] Other (See Comments)    Weak legs    . Namenda [Memantine Hcl]     N/v/d  . Lisinopril Cough    Patient is not aware of this allergy   Prior to Admission medications   Medication Sig Start Date End Date Taking? Authorizing Provider  acetaminophen (TYLENOL) 325 MG tablet Take 325 mg by mouth every 12 (twelve) hours as needed for mild pain or headache.   Yes [provider]  albuterol (PROVENTIL HFA;VENTOLIN HFA) 108 (90 Base) MCG/ACT inhaler Inhale 2 puffs into the lungs every 6 (six) hours as needed for wheezing or shortness of breath. 05/25/18  Yes Kathie Dike, MD  aspirin 81 MG EC tablet Take 81 mg by mouth daily.     Yes [provider]  Blood Glucose Monitoring Suppl (ACCU-CHEK AVIVA PLUS) w/Device KIT Use to check blood sugars twice a day Dx. E11.9 09/30/16  Yes Plotnikov, Evie Lacks, MD  carvedilol (COREG) 3.125 MG tablet Take 1 tablet (3.125 mg total) by mouth daily. 04/06/18  Yes Barton Dubois, MD  digoxin (LANOXIN) 0.125 MG tablet Take 0.5 tablets (0.0625 mg total) by mouth daily. 05/11/18  Yes Johnson, Clanford L, MD  docusate sodium (COLACE) 100 MG capsule Take 100 mg by mouth at bedtime.   Yes [provider]  furosemide (LASIX) 20 MG tablet Take 1 tablet (20 mg total) by mouth every other day. 05/28/18  Yes Almyra Deforest, PA  glipiZIDE (GLUCOTROL) 5 MG tablet Take 0.5 tablets (2.5 mg total) by mouth 2 (two) times daily before a meal. 04/06/18 04/06/19 Yes Barton Dubois, MD  guaiFENesin (MUCINEX) 600 MG 12 hr tablet Take 1 tablet (600 mg total) by mouth 2 (two) times daily. 05/25/18 05/25/19 Yes Memon, Jolaine Artist, MD  guaiFENesin-dextromethorphan (ROBITUSSIN DM) 100-10 MG/5ML syrup Take 5 mLs by mouth 4 (four) times daily as needed for cough.   Yes [provider]  isosorbide mononitrate (IMDUR) 30 MG 24 hr tablet Take 0.5 tablets (15 mg total) by mouth daily. 04/06/18  Yes Barton Dubois, MD  mirtazapine (REMERON) 15 MG tablet Take 15 mg by mouth at bedtime.  05/08/18  Yes [provider]  potassium chloride SA (K-DUR,KLOR-CON) 20 MEQ tablet Take 1 tablet (20 mEq total) by mouth every other day. 05/28/18  Yes Meng, Isaac Laud, PA  sacubitril-valsartan (ENTRESTO) 49-51 MG Take 0.5 tablets by mouth 2 (two) times daily. 04/06/18  Yes Barton Dubois, MD  amoxicillin-clavulanate (AUGMENTIN) 500-125 MG tablet Take 1 tablet by mouth 2 (two) times daily.    [provider]   Dg Chest 1 View  Result Date: 06/01/2018 CLINICAL DATA:  Fall tonight with  left hip pain.  Hip fracture. EXAM: CHEST  1 VIEW COMPARISON:  Radiograph 05/22/2018.  CT 10/29/2015 FINDINGS: The cardiomediastinal contours are normal. Mild cardiomegaly with aortic atherosclerosis. Pulmonary vasculature is normal. No consolidation, pleural effusion, or pneumothorax. Left lateral sixth rib fracture of uncertain acuity. No acute osseous abnormalities are seen. IMPRESSION: 1. Indeterminate left lateral sixth rib fracture. No pulmonary complication. 2. Mild cardiomegaly with Aortic Atherosclerosis (ICD10-I70.0). Electronically Signed   By: Keith Rake M.D.   On: 06/01/2018 06:30   Ct Head Wo Contrast  Result Date: 06/01/2018 CLINICAL DATA:  Fall in nursing home striking head. Laceration to eyebrow. EXAM: CT HEAD WITHOUT CONTRAST CT CERVICAL SPINE WITHOUT CONTRAST TECHNIQUE: Multidetector CT imaging of the head and cervical spine was performed following the standard protocol without intravenous contrast. Multiplanar CT image reconstructions of the cervical spine were also generated. COMPARISON:  Head CT 03/31/2018. Head and cervical spine CT 01/22/2017 FINDINGS: CT HEAD FINDINGS Brain: No intracranial hemorrhage. No subdural or extra-axial fluid collection. Generalized atrophy with ex vacuo dilatation of the right lateral ventricle. Multifocal remote infarcts in the right MCA distribution, remote right basal gangliar infarcts. Remote left cerebellar infarct. The basilar cisterns are patent. Vascular: Atherosclerosis of skullbase  vasculature without hyperdense vessel or abnormal calcification. Skull: No fracture or focal lesion. Sinuses/Orbits: New opacification of sphenoid sinus, ethmoid sinuses, and maxillary sinuses with fluid levels. New partial opacification of right mastoid air cells. No visualized fracture. Other: None. CT CERVICAL SPINE FINDINGS Alignment: Trace anterolisthesis of C4 on C5 and C7 on T1, unchanged from prior exam. No traumatic subluxation. Skull base and vertebrae: No acute fracture. Vertebral body heights are maintained. The dens and skull base are intact. Soft tissues and spinal canal: No prevertebral fluid or swelling. No visible canal hematoma. Disc levels: Disc space narrowing and endplate spurring most prominent at C5-C6 and C6-C7. Multilevel facet arthropathy. Upper chest: Biapical pleuroparenchymal scarring. No acute findings. Other: None. IMPRESSION: 1.  No acute intracranial abnormality.  No skull fracture. 2. Unchanged atrophy and multifocal remote ischemia. 3. Near pan sinusitis, new since CT 2 months ago. 4. Degenerative change in the cervical spine without acute fracture. Electronically Signed   By: Keith Rake M.D.   On: 06/01/2018 06:17   Ct Cervical Spine Wo Contrast  Result Date: 06/01/2018 CLINICAL DATA:  Fall in nursing home striking head. Laceration to eyebrow. EXAM: CT HEAD WITHOUT CONTRAST CT CERVICAL SPINE WITHOUT CONTRAST TECHNIQUE: Multidetector CT imaging of the head and cervical spine was performed following the standard protocol without intravenous contrast. Multiplanar CT image reconstructions of the cervical spine were also generated. COMPARISON:  Head CT 03/31/2018. Head and cervical spine CT 01/22/2017 FINDINGS: CT HEAD FINDINGS Brain: No intracranial hemorrhage. No subdural or extra-axial fluid collection. Generalized atrophy with ex vacuo dilatation of the right lateral ventricle. Multifocal remote infarcts in the right MCA distribution, remote right basal gangliar infarcts.  Remote left cerebellar infarct. The basilar cisterns are patent. Vascular: Atherosclerosis of skullbase vasculature without hyperdense vessel or abnormal calcification. Skull: No fracture or focal lesion. Sinuses/Orbits: New opacification of sphenoid sinus, ethmoid sinuses, and maxillary sinuses with fluid levels. New partial opacification of right mastoid air cells. No visualized fracture. Other: None. CT CERVICAL SPINE FINDINGS Alignment: Trace anterolisthesis of C4 on C5 and C7 on T1, unchanged from prior exam. No traumatic subluxation. Skull base and vertebrae: No acute fracture. Vertebral body heights are maintained. The dens and skull base are intact. Soft tissues and spinal canal: No prevertebral fluid or  swelling. No visible canal hematoma. Disc levels: Disc space narrowing and endplate spurring most prominent at C5-C6 and C6-C7. Multilevel facet arthropathy. Upper chest: Biapical pleuroparenchymal scarring. No acute findings. Other: None. IMPRESSION: 1.  No acute intracranial abnormality.  No skull fracture. 2. Unchanged atrophy and multifocal remote ischemia. 3. Near pan sinusitis, new since CT 2 months ago. 4. Degenerative change in the cervical spine without acute fracture. Electronically Signed   By: Keith Rake M.D.   On: 06/01/2018 06:17   Dg Hip Unilat With Pelvis 2-3 Views Left  Result Date: 06/01/2018 CLINICAL DATA:  Fall tonight with left hip pain. EXAM: DG HIP (WITH OR WITHOUT PELVIS) 2-3V LEFT COMPARISON:  None. FINDINGS: Comminuted displaced intertrochanteric left femur fracture with apex lateral and anterior angulation. Femoral head remains seated. No additional fracture of the pelvis. The pubic rami are intact. IMPRESSION: Comminuted, angulated, and displaced intertrochanteric left femur fracture. Electronically Signed   By: Keith Rake M.D.   On: 06/01/2018 06:28    Positive ROS: All other systems have been reviewed and were otherwise negative with the exception of those  mentioned in the HPI and as above.  Objective: Labs cbc Recent Labs    06/01/18 0546  WBC 9.5  HGB 8.5*  HCT 26.7*  PLT 224    Labs inflam No results for input(s): CRP in the last 72 hours.  Invalid input(s): ESR  Labs coag Recent Labs    06/01/18 0546  INR 1.07    Recent Labs    06/01/18 0546  NA 140  K 3.7  CL 109  CO2 25  GLUCOSE 119*  BUN 24*  CREATININE 1.46*  CALCIUM 8.8*    Physical Exam: Vitals:   06/01/18 2054 06/02/18 0508  BP: (!) 163/71 (!) 151/56  Pulse: 86 77  Resp: (!) 21 (!) 21  Temp: 98.2 F (36.8 C) 98 F (36.7 C)  SpO2: 95% 98%   General: Alert, no acute distress.  Supine in bed on arrival.  Calm and interactive.  Responds appropriately to questions. Mental status: Alert and Oriented to person. Neurologic: Speech Clear and organized, no gross focal findings or movement disorder appreciated. Respiratory: No cyanosis, no use of accessory musculature Cardiovascular: No pedal edema GI: Abdomen is soft and non-tender, non-distended. Skin: Warm and dry.  No lesions in the area of chief complaint. Extremities: Warm and well perfused w/o edema Psychiatric: Patient is competent for consent with normal mood and affect  MUSCULOSKELETAL:  Left hip pain with range of motion and palpation laterally.  No ecchymosis or lesions.  Dorsiflexion, plantarflexion, EHL, FHL intact.  Sensation grossly intact distally. Other extremities are atraumatic with painless ROM and NVI.  Assessment / Plan: Principal Problem:   Closed intertrochanteric fracture of left femur (HCC) Active Problems:   Diabetes mellitus ([place])   Essential hypertension   GERD (gastroesophageal reflux disease)   OSA on CPAP   Falls frequently   Alzheimer's disease ([place])   Chronic kidney disease stage III   Cardiomyopathy, ischemic-EF 30-35% by echo 04/03/20   Chronic systolic CHF (congestive heart failure) (HCC)   Anemia of chronic disease   Hip fracture (HCC)   Closed  displaced left intertrochanteric femur fracture Plan for Operative fixation by Dr. Alain Marion today -NPO  -Medicine team to admit and perform pre-op clearance -PT/OT post op -Smart Wick Foley okay prn for comfort   The risks benefits and alternatives of the procedure were discussed with the patient including but not limited to infection, bleeding,  nerve injury, the need for revision surgery, blood clots, cardiopulmonary complications, morbidity, mortality, among others.  The patient verbalizes understanding and a goal of mobilizing independently.  She wishes to proceed with surgery.    Weightbearing: Bedrest.  Will amend post op. VTE prophylaxis:  SCDs.  Pain control:  Minimize narcotics if able. Follow-up plan: Will follow in acute inpatient post-op phase.  Plan for outpatient follow up with Dr. Alain Marion in about 2 weeks. Contact information:  Edmonia Lynch MD, Roxan Hockey PA-C  Prudencio Burly III PA-C 06/02/2018 6:41 AM

## 2018-06-02 NOTE — Anesthesia Postprocedure Evaluation (Signed)
Anesthesia Post Note  Patient: Mary Cook  Procedure(s) Performed: INTRAMEDULLARY (IM) NAIL FEMORAL (Left )     Patient location during evaluation: PACU Anesthesia Type: Spinal Level of consciousness: awake and alert Pain management: pain level controlled Vital Signs Assessment: post-procedure vital signs reviewed and stable Respiratory status: spontaneous breathing and respiratory function stable Cardiovascular status: blood pressure returned to baseline and stable Postop Assessment: no headache, no backache, spinal receding and no apparent nausea or vomiting Anesthetic complications: no    Last Vitals:  Vitals:   06/02/18 1507 06/02/18 1512  BP: (!) 116/53 (!) 116/53  Pulse: 71 71  Resp: 16   Temp: (!) 36.3 C (!) 36.3 C  SpO2: 95% 94%    Last Pain:  Vitals:   06/02/18 1507  TempSrc:   PainSc: 0-No pain                 Montez Hageman

## 2018-06-02 NOTE — Transfer of Care (Signed)
Immediate Anesthesia Transfer of Care Note  Patient: Mary Cook  Procedure(s) Performed: Procedure(s): INTRAMEDULLARY (IM) NAIL FEMORAL (Left)  Patient Location: PACU  Anesthesia Type:Spinal Level of Consciousness:  sedated, patient cooperative and responds to stimulation  Airway & Oxygen Therapy:Patient Spontanous Breathing and Patient connected to face mask oxgen  Post-op Assessment:  Report given to PACU RN and Post -op Vital signs reviewed and stable  Post vital signs:  Reviewed and stable  Last Vitals:  Vitals:   06/01/18 2054 06/02/18 0508  BP: (!) 163/71 (!) 151/56  Pulse: 86 77  Resp: (!) 21 (!) 21  Temp: 36.8 C 36.7 C  SpO2: 19% 41%    Complications: No apparent anesthesia complications

## 2018-06-02 NOTE — Progress Notes (Signed)
Patient returned from surgery. Vital signs stable. Alert to self and place. Family at bedside. Reports no pain. RN will continue to monitor.

## 2018-06-02 NOTE — Progress Notes (Signed)
PROGRESS NOTE    Mary Cook  VOJ:[id number] DOB: 04-Jun-1926 DOA: 06/01/2018 PCP: Rosita Fire, MD   Brief Narrative: Mary Cook is a 84 y.o.  female with chronic combined systolic and diastolic heart failure, cardiomyopathy, HTN, CAD, type 2 DM, OSA. Patient presented after a fall and found to have a left    Assessment & Plan:   Principal Problem:   Closed intertrochanteric fracture of left femur ([place]) Active Problems:   Diabetes mellitus ([place])   Essential hypertension   GERD (gastroesophageal reflux disease)   OSA on CPAP   Falls frequently   Alzheimer's disease ([place])   Chronic kidney disease stage III   Cardiomyopathy, ischemic-EF 30-35% by echo 05/04/36   Chronic systolic CHF (congestive heart failure) ([facility])   Anemia of chronic disease   Hip fracture ([facility])   Left hip fracture Plan for surgery today. Patient is moderate risk. Currently having some chest pain, but more likely reflux. Patient is a moderate risk for surgery. Benefits seem to outweigh the risk as patient is mobile at baseline and lives at an ALF. -Orthopedic surgery recommendations: surgery today  Chest pain Non-specific. Does not seem cardiac. EKG unchanged. Troponin baseline  GERD -Continue Protonix  OSA -Continue CPAP  Chronic systolic heart failure Compensated. On lasix as an outpatient. Last EF of 30-35%. Follows with cardiology as an outpatient. -Continue lasix  CKD stage 3 Stable.  Diabetes mellitus, type 2 -Continue sliding scale   DVT prophylaxis: Heparin subq Code Status:   Code Status: DNR Family Communication: None at bedside Disposition Plan: Discharge pending   Consultants:   Orthopedic surgery  Procedures:   None  Antimicrobials:  None    Subjective: Some chest pain. Non-radiating. No dyspnea.  Objective: Vitals:   06/01/18 1517 06/01/18 1621 06/01/18 2054 06/02/18 0508  BP: (!) 153/74 (!) 159/72 (!) 163/71 (!) 151/56  Pulse: 86 89 86 77    Resp: 16 18 (!) 21 (!) 21  Temp:  98.2 F (36.8 C) 98.2 F (36.8 C) 98 F (36.7 C)  TempSrc:  Oral Oral Oral  SpO2: 99% 97% 95% 98%  Weight:      Height:        Intake/Output Summary (Last 24 hours) at 06/02/2018 1019 Last data filed at 06/02/2018 0600 Gross per 24 hour  Intake -  Output 200 ml  Net -200 ml   Filed Weights   06/01/18 0440  Weight: 57.8 kg    Examination:  General exam: Appears calm and comfortable Respiratory system: Clear to auscultation. Respiratory effort normal. Cardiovascular system: S1 & S2 heard, RRR. No murmurs. Gastrointestinal system: Abdomen is nondistended, soft and nontender. No organomegaly or masses felt. Normal bowel sounds heard. Central nervous system: Alert and oriented to person only. No focal neurological deficits. Extremities: No edema. No calf tenderness. Left hip with ice pack. Skin: No cyanosis. No rashes Psychiatry: Judgement and insight appear normal. Mood & affect appropriate.     Data Reviewed: I have personally reviewed following labs and imaging studies  CBC: Recent Labs  Lab 06/01/18 0546 06/02/18 0630  WBC 9.5 7.7  NEUTROABS 7.0  --   HGB 8.5* 8.5*  HCT 26.7* 26.5*  MCV 95.7 94.3  PLT 224 169   Basic Metabolic Panel: Recent Labs  Lab 05/28/18 1055 06/01/18 0546 06/02/18 0630  NA 142 140 141  K 4.6 3.7 4.6  CL 106 109 107  CO2 22 25 23   GLUCOSE 132* 119* 122*  BUN 28 24* 27*  CREATININE 1.61* 1.46* 1.60*  CALCIUM 9.4 8.8* 8.9   GFR: Estimated Creatinine Clearance: 21 mL/min (A) (by C-G formula based on SCr of 1.6 mg/dL (H)). Liver Function Tests: Recent Labs  Lab 06/01/18 0546  AST 15  ALT 14  ALKPHOS 50  BILITOT 0.5  PROT 6.3*  ALBUMIN 2.9*   No results for input(s): LIPASE, AMYLASE in the last 168 hours. No results for input(s): AMMONIA in the last 168 hours. Coagulation Profile: Recent Labs  Lab 06/01/18 0546  INR 1.07   Cardiac Enzymes: No results for input(s): CKTOTAL, CKMB,  CKMBINDEX, TROPONINI in the last 168 hours. BNP (last 3 results) No results for input(s): PROBNP in the last 8760 hours. HbA1C: No results for input(s): HGBA1C in the last 72 hours. CBG: Recent Labs  Lab 06/01/18 1133 06/01/18 1308 06/01/18 1624 06/01/18 2056 06/02/18 0735  GLUCAP 138* 140* 128* 136* 125*   Lipid Profile: No results for input(s): CHOL, HDL, LDLCALC, TRIG, CHOLHDL, LDLDIRECT in the last 72 hours. Thyroid Function Tests: No results for input(s): TSH, T4TOTAL, FREET4, T3FREE, THYROIDAB in the last 72 hours. Anemia Panel: No results for input(s): VITAMINB12, FOLATE, FERRITIN, TIBC, IRON, RETICCTPCT in the last 72 hours. Sepsis Labs: No results for input(s): PROCALCITON, LATICACIDVEN in the last 168 hours.  Recent Results (from the past 240 hour(s))  Surgical pcr screen     Status: None   Collection Time: 06/01/18 10:53 PM  Result Value Ref Range Status   MRSA, PCR NEGATIVE NEGATIVE Final   Staphylococcus aureus NEGATIVE NEGATIVE Final    Comment: (NOTE) The Xpert SA Assay (FDA approved for NASAL specimens in patients 4 years of age and older), is one component of a comprehensive surveillance program. It is not intended to diagnose infection nor to guide or monitor treatment. Performed at [facility], [place] [address], [place], [place] [address]          Radiology Studies: Dg Chest 1 View  Result Date: 06/01/2018 CLINICAL DATA:  Fall tonight with left hip pain.  Hip fracture. EXAM: CHEST  1 VIEW COMPARISON:  Radiograph 05/22/2018.  CT 10/29/2015 FINDINGS: The cardiomediastinal contours are normal. Mild cardiomegaly with aortic atherosclerosis. Pulmonary vasculature is normal. No consolidation, pleural effusion, or pneumothorax. Left lateral sixth rib fracture of uncertain acuity. No acute osseous abnormalities are seen. IMPRESSION: 1. Indeterminate left lateral sixth rib fracture. No pulmonary complication. 2. Mild cardiomegaly with Aortic  Atherosclerosis (ICD10-I70.0). Electronically Signed   By: Keith Rake M.D.   On: 06/01/2018 06:30   Ct Head Wo Contrast  Result Date: 06/01/2018 CLINICAL DATA:  Fall in nursing home striking head. Laceration to eyebrow. EXAM: CT HEAD WITHOUT CONTRAST CT CERVICAL SPINE WITHOUT CONTRAST TECHNIQUE: Multidetector CT imaging of the head and cervical spine was performed following the standard protocol without intravenous contrast. Multiplanar CT image reconstructions of the cervical spine were also generated. COMPARISON:  Head CT 03/31/2018. Head and cervical spine CT 01/22/2017 FINDINGS: CT HEAD FINDINGS Brain: No intracranial hemorrhage. No subdural or extra-axial fluid collection. Generalized atrophy with ex vacuo dilatation of the right lateral ventricle. Multifocal remote infarcts in the right MCA distribution, remote right basal gangliar infarcts. Remote left cerebellar infarct. The basilar cisterns are patent. Vascular: Atherosclerosis of skullbase vasculature without hyperdense vessel or abnormal calcification. Skull: No fracture or focal lesion. Sinuses/Orbits: New opacification of sphenoid sinus, ethmoid sinuses, and maxillary sinuses with fluid levels. New partial opacification of right mastoid air cells. No visualized fracture. Other: None. CT CERVICAL SPINE FINDINGS  Alignment: Trace anterolisthesis of C4 on C5 and C7 on T1, unchanged from prior exam. No traumatic subluxation. Skull base and vertebrae: No acute fracture. Vertebral body heights are maintained. The dens and skull base are intact. Soft tissues and spinal canal: No prevertebral fluid or swelling. No visible canal hematoma. Disc levels: Disc space narrowing and endplate spurring most prominent at C5-C6 and C6-C7. Multilevel facet arthropathy. Upper chest: Biapical pleuroparenchymal scarring. No acute findings. Other: None. IMPRESSION: 1.  No acute intracranial abnormality.  No skull fracture. 2. Unchanged atrophy and multifocal remote  ischemia. 3. Near pan sinusitis, new since CT 2 months ago. 4. Degenerative change in the cervical spine without acute fracture. Electronically Signed   By: Keith Rake M.D.   On: 06/01/2018 06:17   Ct Cervical Spine Wo Contrast  Result Date: 06/01/2018 CLINICAL DATA:  Fall in nursing home striking head. Laceration to eyebrow. EXAM: CT HEAD WITHOUT CONTRAST CT CERVICAL SPINE WITHOUT CONTRAST TECHNIQUE: Multidetector CT imaging of the head and cervical spine was performed following the standard protocol without intravenous contrast. Multiplanar CT image reconstructions of the cervical spine were also generated. COMPARISON:  Head CT 03/31/2018. Head and cervical spine CT 01/22/2017 FINDINGS: CT HEAD FINDINGS Brain: No intracranial hemorrhage. No subdural or extra-axial fluid collection. Generalized atrophy with ex vacuo dilatation of the right lateral ventricle. Multifocal remote infarcts in the right MCA distribution, remote right basal gangliar infarcts. Remote left cerebellar infarct. The basilar cisterns are patent. Vascular: Atherosclerosis of skullbase vasculature without hyperdense vessel or abnormal calcification. Skull: No fracture or focal lesion. Sinuses/Orbits: New opacification of sphenoid sinus, ethmoid sinuses, and maxillary sinuses with fluid levels. New partial opacification of right mastoid air cells. No visualized fracture. Other: None. CT CERVICAL SPINE FINDINGS Alignment: Trace anterolisthesis of C4 on C5 and C7 on T1, unchanged from prior exam. No traumatic subluxation. Skull base and vertebrae: No acute fracture. Vertebral body heights are maintained. The dens and skull base are intact. Soft tissues and spinal canal: No prevertebral fluid or swelling. No visible canal hematoma. Disc levels: Disc space narrowing and endplate spurring most prominent at C5-C6 and C6-C7. Multilevel facet arthropathy. Upper chest: Biapical pleuroparenchymal scarring. No acute findings. Other: None.  IMPRESSION: 1.  No acute intracranial abnormality.  No skull fracture. 2. Unchanged atrophy and multifocal remote ischemia. 3. Near pan sinusitis, new since CT 2 months ago. 4. Degenerative change in the cervical spine without acute fracture. Electronically Signed   By: Keith Rake M.D.   On: 06/01/2018 06:17   Dg Hip Unilat With Pelvis 2-3 Views Left  Result Date: 06/01/2018 CLINICAL DATA:  Fall tonight with left hip pain. EXAM: DG HIP (WITH OR WITHOUT PELVIS) 2-3V LEFT COMPARISON:  None. FINDINGS: Comminuted displaced intertrochanteric left femur fracture with apex lateral and anterior angulation. Femoral head remains seated. No additional fracture of the pelvis. The pubic rami are intact. IMPRESSION: Comminuted, angulated, and displaced intertrochanteric left femur fracture. Electronically Signed   By: Keith Rake M.D.   On: 06/01/2018 06:28        Scheduled Meds: . carvedilol  3.125 mg Oral Daily  . digoxin  0.0625 mg Oral Daily  . docusate sodium  100 mg Oral BID  . [START ON 06/03/2018] ferrous sulfate  325 mg Oral Q breakfast  . furosemide  20 mg Oral QODAY  . guaiFENesin  600 mg Oral BID  . heparin  5,000 Units Subcutaneous Q8H  . insulin aspart  0-9 Units Subcutaneous TID WC  . isosorbide  mononitrate  15 mg Oral Daily  . mirtazapine  15 mg Oral QHS  . pantoprazole  40 mg Oral Daily  . potassium chloride SA  10 mEq Oral Daily  . sacubitril-valsartan  0.5 tablet Oral BID   Continuous Infusions: .  ceFAZolin (ANCEF) IV       LOS: 1 day     Cordelia Poche, MD [facility] 06/02/2018, 10:19 AM Pager: [phone]  If 7PM-7AM, please contact night-coverage [email or web address] 06/02/2018, 10:19 AM

## 2018-06-02 NOTE — Op Note (Signed)
DATE OF SURGERY:  06/02/2018  TIME: 12:45 PM  PATIENT NAME:  Mary Cook  AGE: 84 y.o.  PRE-OPERATIVE DIAGNOSIS:  fractured left hip  POST-OPERATIVE DIAGNOSIS:  SAME  PROCEDURE:  INTRAMEDULLARY (IM) NAIL FEMORAL  SURGEON:  Renette Butters  ASSISTANT:  Roxan Hockey, PA-C, he was present and scrubbed throughout the case, critical for completion in a timely fashion, and for retraction, instrumentation, and closure.   OPERATIVE IMPLANTS: [employer]ryker Gamma Nail  PREOPERATIVE INDICATIONS:  [name] is a 84 y.o. year old who fell and suffered a hip fracture. She was brought into the ER and then admitted and optimized and then elected for surgical intervention.    The risks benefits and alternatives were discussed with the patient including but not limited to the risks of nonoperative treatment, versus surgical intervention including infection, bleeding, nerve injury, malunion, nonunion, hardware prominence, hardware failure, need for hardware removal, blood clots, cardiopulmonary complications, morbidity, mortality, among others, and they were willing to proceed.    OPERATIVE PROCEDURE:  The patient was brought to the operating room and placed in the supine position. General anesthesia was administered. She was placed on the fracture table.  Closed reduction was performed under C-arm guidance. Time out was then performed after sterile prep and drape. She received preoperative antibiotics.  Incision was made proximal to the greater trochanter. A guidewire was placed in the appropriate position. Confirmation was made on AP and lateral views. The above-named nail was opened. I opened the proximal femur with a reamer. I then placed the nail by hand easily down. I did not need to ream the femur.  Once the nail was completely seated, I placed a guidepin into the femoral head into the center center position. I measured the length, and then reamed the lateral cortex and up into the  head. I then placed the lag screw. Slight compression was applied. Anatomic fixation achieved. Bone quality was mediocre.  I then secured the proximal interlocking bolt, and took off a half a turn, and then removed the instruments, and took final C-arm pictures AP and lateral the entire length of the leg.  I then used perfect circles technique to place a distal interlock screw.   Anatomic reconstruction was achieved, and the wounds were irrigated copiously and closed with Vicryl followed by staples and sterile gauze for the skin. The patient was awakened and returned to PACU in stable and satisfactory condition. There no complications and the patient tolerated the procedure well.  She will be weightbearing as tolerated, and will be on chemical px  for a period of four weeks after discharge.   Edmonia Lynch, M.D.

## 2018-06-02 NOTE — Anesthesia Preprocedure Evaluation (Signed)
Anesthesia Evaluation  Patient identified by MRN, date of birth, ID band Patient awake    Reviewed: Allergy & Precautions, NPO status , Patient's Chart, lab work & pertinent test results  Airway Mallampati: II  TM Distance: >3 FB     Dental  (+) Edentulous Upper, Edentulous Lower   Pulmonary     + decreased breath sounds      Cardiovascular hypertension,  Rhythm:Regular Rate:Normal     Neuro/Psych    GI/Hepatic   Endo/Other  diabetes  Renal/GU      Musculoskeletal   Abdominal   Peds  Hematology   Anesthesia Other Findings   Reproductive/Obstetrics                             Anesthesia Physical Anesthesia Plan  ASA: III  Anesthesia Plan: Spinal   Post-op Pain Management:    Induction: Intravenous  PONV Risk Score and Plan: Ondansetron  Airway Management Planned: Natural Airway and Simple Face Mask  Additional Equipment:   Intra-op Plan:   Post-operative Plan:   Informed Consent: I have reviewed the patients History and Physical, chart, labs and discussed the procedure including the risks, benefits and alternatives for the proposed anesthesia with the patient or authorized representative who has indicated his/her understanding and acceptance.     Plan Discussed with: CRNA and Anesthesiologist  Anesthesia Plan Comments:         Anesthesia Quick Evaluation

## 2018-06-02 NOTE — Interval H&P Note (Signed)
I participated in the care of this patient and agree with the above history, physical and evaluation. I performed a review of the history and a physical exam as detailed   Seretha Estabrooks Daniel Dewell Monnier MD  

## 2018-06-02 NOTE — Progress Notes (Signed)
CRITICAL VALUE ALERT  Critical Value:  Troponin 0.03  Date & Time Notied:  06/02/2018 1113  Provider Notified: Lonny Prude  Orders Received/Actions taken: No new orders at this time.

## 2018-06-02 NOTE — Anesthesia Procedure Notes (Signed)
Spinal  Patient location during procedure: OR Start time: 06/02/2018 12:40 PM End time: 06/02/2018 12:45 PM Staffing Anesthesiologist: Roberts Gaudy, MD Performed: anesthesiologist  Preanesthetic Checklist Completed: patient identified, site marked, surgical consent, pre-op evaluation, timeout performed, IV checked, risks and benefits discussed and monitors and equipment checked Spinal Block Patient position: left lateral decubitus Prep: ChloraPrep Patient monitoring: heart rate, cardiac monitor, continuous pulse ox and blood pressure Approach: left paramedian Location: L3-4 Injection technique: single-shot Needle Needle type: Pencil-Tip  Needle gauge: 22 G Needle length: 9 cm Assessment Sensory level: T6 Additional Notes 10 mg 0.75% Bupivacaine injected easily

## 2018-06-02 NOTE — Progress Notes (Signed)
Pt refuses CPAP QHS, RT to monitor and assess as needed.  

## 2018-06-03 ENCOUNTER — Encounter (HOSPITAL_COMMUNITY): Payer: Self-pay | Admitting: Orthopedic Surgery

## 2018-06-03 DIAGNOSIS — D649 Anemia, unspecified: Secondary | ICD-10-CM

## 2018-06-03 LAB — GLUCOSE, CAPILLARY
GLUCOSE-CAPILLARY: 209 mg/dL — AB (ref 70–99)
Glucose-Capillary: 119 mg/dL — ABNORMAL HIGH (ref 70–99)
Glucose-Capillary: 140 mg/dL — ABNORMAL HIGH (ref 70–99)
Glucose-Capillary: 171 mg/dL — ABNORMAL HIGH (ref 70–99)
Glucose-Capillary: 119 mg/dL — ABNORMAL HIGH (ref 70–99)

## 2018-06-03 LAB — BASIC METABOLIC PANEL
ANION GAP: 12 (ref 5–15)
BUN: 32 mg/dL — ABNORMAL HIGH (ref 8–23)
CHLORIDE: 105 mmol/L (ref 98–111)
CO2: 23 mmol/L (ref 22–32)
Calcium: 9.2 mg/dL (ref 8.9–10.3)
Creatinine, Ser: 1.86 mg/dL — ABNORMAL HIGH (ref 0.44–1.00)
GFR calc Af Amer: 26 mL/min — ABNORMAL LOW (ref >60)
GFR calc non Af Amer: 23 mL/min — ABNORMAL LOW (ref >60)
GLUCOSE: 130 mg/dL — AB (ref 70–99)
POTASSIUM: 5.2 mmol/L — AB (ref 3.5–5.1)
Sodium: 140 mmol/L (ref 135–145)

## 2018-06-03 LAB — HEMOGLOBIN AND HEMATOCRIT, BLOOD
HCT: 28.1 % — ABNORMAL LOW (ref 36.0–46.0)
HEMOGLOBIN: 8.9 g/dL — AB (ref 12.0–15.0)

## 2018-06-03 LAB — PREPARE RBC (CROSSMATCH)

## 2018-06-03 MED ORDER — SODIUM CHLORIDE 0.9% IV SOLUTION
Freq: Once | INTRAVENOUS | Status: AC
  Administered 2018-06-03: 09:00:00 via INTRAVENOUS

## 2018-06-03 MED ORDER — FUROSEMIDE 10 MG/ML IJ SOLN
20.0000 mg | Freq: Once | INTRAMUSCULAR | Status: AC
  Administered 2018-06-03: 20 mg via INTRAVENOUS
  Filled 2018-06-03: qty 2

## 2018-06-03 MED ORDER — ASPIRIN 81 MG PO CHEW
81.0000 mg | CHEWABLE_TABLET | Freq: Two times a day (BID) | ORAL | Status: DC
  Administered 2018-06-03 – 2018-06-06 (×6): 81 mg via ORAL
  Filled 2018-06-03 (×6): qty 1

## 2018-06-03 NOTE — Progress Notes (Signed)
PT Cancellation Note  Patient Details Name: Mary Cook MRN: [id number] DOB: Jan 22, 1926   Cancelled Treatment:    Reason Eval/Treat Not Completed: Medical issues which prohibited therapy Pt with Hgb of 6.6.  PRBC ordered and pending.  Will check back as schedule permits.   Eshaan Titzer,KATHrine E 06/03/2018, 8:36 AM Carmelia Bake, PT, DPT Acute Rehabilitation Services Office: [phone] Pager: [phone]

## 2018-06-03 NOTE — Progress Notes (Signed)
PROGRESS NOTE    Mary Cook  OJJ:[id number] DOB: [birth year] DOA: 06/01/2018 PCP: Rosita Fire, MD   Brief Narrative:  84 year old with a past medical history of chronic combined systolic and diastolic congestive heart failure with ejection fraction 35%, CAD, diabetes mellitus type 2, obstructive sleep apnea, essential hypertension presented to the ER after sustaining a fall causing left hip fracture.  Patient's fall occurred at [facility].  She also sustained small left eyebrow laceration which was repaired in the ER.  X-ray showed intertrochanteric fracture of the left femur.  Orthopedic was consulted and she underwent intramedullary nailing of the left hip on 10/8 which she tolerated well.  Following day her hemoglobin had dropped to 6.6 without any obvious signs of bleeding.  This was likely secondary to dilution nature therefore 2 units of PRBC was ordered.   Assessment & Plan:   Principal Problem:   Closed intertrochanteric fracture of left femur (HCC) Active Problems:   Diabetes mellitus ([place])   Essential hypertension   GERD (gastroesophageal reflux disease)   OSA on CPAP   Falls frequently   Alzheimer's disease ([place])   Chronic kidney disease stage III   Cardiomyopathy, ischemic-EF 30-35% by echo 04/28/70   Chronic systolic CHF (congestive heart failure) (HCC)   Anemia of chronic disease   Hip fracture (HCC)   Mechanical fall causing left femur fracture Status post intramedullary nailing of the left hip 06/02/2018 - Patient tolerated the procedure well, pain control, bowel regimen as needed -PT/OT consulted, currently awaiting their input - Orthopedic following, will stop the Lovenox due to hemoglobin drop.  Aspirin 81 mg twice daily ordered by orthopedic.  We will plan to keep her on this for about 35 days and follow-up with their service in about 2 weeks outpatient -Supportive care  Symptomatic anemia, unknown exact etiology - Component of probably  dilution versus slight expected bleeding from surgery.  Will transfuse her 2 units of PRBC in between we will give her Lasix 20 mg IV.  Check her hemoglobin after the transfusion -Continue to provide supportive care, continue daily iron supplements.  Coronary artery disease, stable History of combined systolic and diastolic congestive heart failure, ejection fraction 35% - Continue her home regimen of Coreg, digoxin, Lasix, isosorbide mononitrate and Entresto.  Currently she is chest pain-free.  Follow-up with outpatient cardiology -Aspirin for now has been increased to 81 mg twice a day, eventually will be transition to 81 mg daily  Essential hypertension -Continue blood pressure medications as stated above  GERD -PPI  Obstructive sleep apnea -CPAP  Chronic kidney disease stage III  Appears to be stable.  Diabetes mellitus type 2 -Continue Accu-Cheks and sliding scale   DVT prophylaxis: Lovenox to be switched ASA 81mg  bid Code Status: DNR Family Communication:  None at bedside  Disposition Plan: Currently getting PRBC transfusion to ensure her symptomatic anemia improves.  Once her hemoglobin has stabilized and she has worked with PT/OT will eventually transition her to skilled nursing facility over the next 24-48 hours.  In the meantime we will closely monitor in the hospital.  Consultants:   Orthopedic  Procedures:   Left hip intramedullary nailing 10/8  Antimicrobials:   None besides perioperative   Subjective: Patient still reports of mild fatigue especially with minimal exertion.  Also reports of left hip pain as expected from the surgery.  Review of Systems Otherwise negative except as per HPI, including: General: Denies fever, chills, night sweats or unintended weight loss. Resp: Denies cough, wheezing,  shortness of breath. Cardiac: Denies chest pain, palpitations, orthopnea, paroxysmal nocturnal dyspnea. GI: Denies abdominal pain, nausea, vomiting, diarrhea  or constipation GU: Denies dysuria, frequency, hesitancy or incontinence MS: Denies muscle aches, joint pain or swelling Neuro: Denies headache, neurologic deficits (focal weakness, numbness, tingling), abnormal gait Psych: Denies anxiety, depression, SI/HI/AVH Skin: Denies new rashes or lesions ID: Denies sick contacts, exotic exposures, travel  Objective: Vitals:   06/03/18 0438 06/03/18 0844 06/03/18 0852 06/03/18 0909  BP: (!) 112/54 (!) 121/56 (!) 121/56 (!) 115/50  Pulse: 75 77 77 76  Resp: 17  18 18   Temp: 97.8 F (36.6 C) 98 F (36.7 C) 98 F (36.7 C) 97.8 F (36.6 C)  TempSrc:  Oral Oral Oral  SpO2: 99% 100% 100% 99%  Weight:      Height:        Intake/Output Summary (Last 24 hours) at 06/03/2018 1051 Last data filed at 06/03/2018 0912 Gross per 24 hour  Intake 2322 ml  Output 300 ml  Net 2022 ml   Filed Weights   06/01/18 0440  Weight: 57.8 kg    Examination:  General exam: Appears calm and comfortable, appears slightly pale, elderly frail-appearing Respiratory system: Diminished breath sounds at the bases Cardiovascular system: S1 & S2 heard, RRR. No JVD, murmurs, rubs, gallops or clicks. No pedal edema. Gastrointestinal system: Abdomen is nondistended, soft and nontender. No organomegaly or masses felt. Normal bowel sounds heard. Central nervous system: Alert and oriented. No focal neurological deficits. Extremities: Symmetric 5 x 5 power. Skin: Left hip dressing noted, surgical site looks okay without any evidence of active bleeding. Psychiatry: Judgement and insight appear normal. Mood & affect appropriate.     Data Reviewed:   CBC: Recent Labs  Lab 06/01/18 0546 06/02/18 0630 06/03/18 0609  WBC 9.5 7.7 9.6  NEUTROABS 7.0  --   --   HGB 8.5* 8.5* 6.6*  HCT 26.7* 26.5* 21.0*  MCV 95.7 94.3 96.8  PLT 224 208 017   Basic Metabolic Panel: Recent Labs  Lab 05/28/18 1055 06/01/18 0546 06/02/18 0630 06/03/18 0533  NA 142 140 141 140  K 4.6  3.7 4.6 5.2*  CL 106 109 107 105  CO2 22 25 23 23   GLUCOSE 132* 119* 122* 130*  BUN 28 24* 27* 32*  CREATININE 1.61* 1.46* 1.60* 1.86*  CALCIUM 9.4 8.8* 8.9 9.2   GFR: Estimated Creatinine Clearance: 18.1 mL/min (A) (by C-G formula based on SCr of 1.86 mg/dL (H)). Liver Function Tests: Recent Labs  Lab 06/01/18 0546  AST 15  ALT 14  ALKPHOS 50  BILITOT 0.5  PROT 6.3*  ALBUMIN 2.9*   No results for input(s): LIPASE, AMYLASE in the last 168 hours. No results for input(s): AMMONIA in the last 168 hours. Coagulation Profile: Recent Labs  Lab 06/01/18 0546  INR 1.07   Cardiac Enzymes: Recent Labs  Lab 06/02/18 1013  TROPONINI 0.03*   BNP (last 3 results) No results for input(s): PROBNP in the last 8760 hours. HbA1C: No results for input(s): HGBA1C in the last 72 hours. CBG: Recent Labs  Lab 06/02/18 1207 06/02/18 1406 06/02/18 1607 06/02/18 2037 06/03/18 0727  GLUCAP 88 103* 140* 209* 119*   Lipid Profile: No results for input(s): CHOL, HDL, LDLCALC, TRIG, CHOLHDL, LDLDIRECT in the last 72 hours. Thyroid Function Tests: No results for input(s): TSH, T4TOTAL, FREET4, T3FREE, THYROIDAB in the last 72 hours. Anemia Panel: No results for input(s): VITAMINB12, FOLATE, FERRITIN, TIBC, IRON, RETICCTPCT in the last 72  hours. Sepsis Labs: No results for input(s): PROCALCITON, LATICACIDVEN in the last 168 hours.  Recent Results (from the past 240 hour(s))  Surgical pcr screen     Status: None   Collection Time: 06/01/18 10:53 PM  Result Value Ref Range Status   MRSA, PCR NEGATIVE NEGATIVE Final   Staphylococcus aureus NEGATIVE NEGATIVE Final    Comment: (NOTE) The Xpert SA Assay (FDA approved for NASAL specimens in patients 59 years of age and older), is one component of a comprehensive surveillance program. It is not intended to diagnose infection nor to guide or monitor treatment. Performed at [facility], [place] [address], [place],  [place] [address]          Radiology Studies: Dg C-arm 1-60 Min-no Report  Result Date: 06/02/2018 Fluoroscopy was utilized by the requesting physician.  No radiographic interpretation.   Dg Femur Min 2 Views Left  Result Date: 06/02/2018 CLINICAL DATA:  Internal fixation of left femur. EXAM: LEFT FEMUR 2 VIEWS FLUOROSCOPY TIME:  57 seconds. COMPARISON:  Radiographs of June 01, 2018. FINDINGS: Four intraoperative fluoroscopic images demonstrate the patient be status post intramedullary rod fixation of proximal left femoral fracture. Good alignment of fracture components is noted. IMPRESSION: Status post intramedullary rod fixation of proximal left femoral fracture. Electronically Signed   By: Marijo Conception, M.D.   On: 06/02/2018 14:19        Scheduled Meds: . acetaminophen  500 mg Oral Q6H  . aspirin  81 mg Oral BID  . carvedilol  3.125 mg Oral Daily  . digoxin  0.0625 mg Oral Daily  . docusate sodium  100 mg Oral BID  . ferrous sulfate  325 mg Oral Q breakfast  . furosemide  20 mg Intravenous Once  . furosemide  20 mg Oral QODAY  . guaiFENesin  600 mg Oral BID  . insulin aspart  0-9 Units Subcutaneous TID WC  . isosorbide mononitrate  15 mg Oral Daily  . mirtazapine  15 mg Oral QHS  . pantoprazole  40 mg Oral Daily  . potassium chloride SA  10 mEq Oral Daily  . sacubitril-valsartan  0.5 tablet Oral BID   Continuous Infusions: . lactated ringers 50 mL/hr at 06/03/18 0447     LOS: 2 days   Time spent= 40 mins    Ankit Arsenio Loader, MD [facility] Pager [phone]   If 7PM-7AM, please contact night-coverage [email or web address] Password [email or web address] 06/03/2018, 10:51 AM

## 2018-06-03 NOTE — Progress Notes (Signed)
CRITICAL VALUE ALERT  Critical Value:  Hgb 6.5  Date & Time Notied:  06/03/18  Provider Notified: Baltazar Najjar, NP  Orders Received/Actions taken: no orders received will page attending physician.

## 2018-06-03 NOTE — Progress Notes (Signed)
OT Cancellation Note  Patient Details Name: Mary Cook MRN: [id number] DOB: Sep 03, 1925   Cancelled Treatment:    Reason Eval/Treat Not Completed: Medical issues which prohibited therapy. Pt with Hgb of 6.6.  PRBC pending. Will either check back this pm or tomorrow am.   [facility] 06/03/2018, 7:39 AM  Lesle Chris, OTR/L Acute Rehabilitation Services [phone] [facility] pager [phone] office 06/03/2018

## 2018-06-03 NOTE — Progress Notes (Addendum)
Addendum: Changing DVT prophylaxis to ASA 81 BID dt Anemia.  Dr. Alain Marion discussed same with the medicine team.      Subjective: Patient reports pain as mild.   No CP, SOB.  No dizziness or weakness.  Not yet OOB.  Objective:   VITALS:   Vitals:   06/02/18 1829 06/02/18 2028 06/03/18 0023 06/03/18 0438  BP: 115/60 (!) 112/58 118/66 (!) 112/54  Pulse: 88 91 82 75  Resp: 16 20 20 17   Temp: 98.2 F (36.8 C) 97.8 F (36.6 C) 98.2 F (36.8 C) 97.8 F (36.6 C)  TempSrc:      SpO2: 100% 98% 98% 99%  Weight:      Height:       CBC Latest Ref Rng & Units 06/02/2018 06/01/2018 05/24/2018  WBC 4.0 - 10.5 K/uL 7.7 9.5 8.0  Hemoglobin 12.0 - 15.0 g/dL 8.5(L) 8.5(L) 9.1(L)  Hematocrit 36.0 - 46.0 % 26.5(L) 26.7(L) 28.7(L)  Platelets 150 - 400 K/uL 208 224 228   BMP Latest Ref Rng & Units 06/02/2018 06/01/2018 05/28/2018  Glucose 70 - 99 mg/dL 122(H) 119(H) 132(H)  BUN 8 - 23 mg/dL 27(H) 24(H) 28  Creatinine 0.44 - 1.00 mg/dL 1.60(H) 1.46(H) 1.61(H)  BUN/Creat Ratio 12 - 28 - - 17  Sodium 135 - 145 mmol/L 141 140 142  Potassium 3.5 - 5.1 mmol/L 4.6 3.7 4.6  Chloride 98 - 111 mmol/L 107 109 106  CO2 22 - 32 mmol/L 23 25 22   Calcium 8.9 - 10.3 mg/dL 8.9 8.8(L) 9.4   Intake/Output      10/08 0701 - 10/09 0700   P.O. 220   I.V. (mL/kg) 1365.2 (23.6)   Other 0   IV Piggyback 301.8   Total Intake(mL/kg) 1887 (32.6)   Urine (mL/kg/hr) 200 (0.1)   Blood 100   Total Output 300   Net +1587          Physical Exam: General: NAD.  Supine in bed on arrival.  Calm and interactive.  Responds appropriately to questions.  No increased work of breathing. MSK LLE: Surgical dressings in place, CDI. Sensation intact distally Feet warm Dorsiflexion/Plantar flexion intact  Assessment: 1 Day Post-Op  S/P Procedure(s) (LRB): INTRAMEDULLARY (IM) NAIL FEMORAL (Left) by Dr. Ernesta Amble. Percell Miller on 06/02/2018  Principal Problem:   Closed intertrochanteric fracture of left femur ([place]) Active  Problems:   Diabetes mellitus ([place])   Essential hypertension   GERD (gastroesophageal reflux disease)   OSA on CPAP   Falls frequently   Alzheimer's disease ([place])   Chronic kidney disease stage III   Cardiomyopathy, ischemic-EF 30-35% by echo 01/30/66   Chronic systolic CHF (congestive heart failure) (HCC)   Anemia of chronic disease   Hip fracture (HCC)   Closed left intertrochanteric femur fracture, status post IM nail Stable from an orthopedic perspective postop day 1 Pain controlled Not yet mobilized with therapy  Plan: Up with therapy Incentive Spirometry Apply ice as needed  Weightbearing: WBAT LLE Insicional and dressing care: Dressings left intact until follow-up Showering: Keep dressing dry VTE prophylaxis: Lovenox, SCDs, ambulation Pain control: Minimize narcotics.  Tylenol for mild pain. Follow - up plan: 2 weeks in the office with Dr. Leamon Arnt information:  Edmonia Lynch MD, Roxan Hockey PA-C  Dispo: Therapy evaluations pending.  Discharge per primary team when ready medically and mobilized.   Charna Elizabeth Martensen III, PA-C 06/03/2018, 6:23 AM

## 2018-06-04 DIAGNOSIS — I2510 Atherosclerotic heart disease of native coronary artery without angina pectoris: Secondary | ICD-10-CM | POA: Diagnosis not present

## 2018-06-04 LAB — CBC
HCT: 21 % — ABNORMAL LOW (ref 36.0–46.0)
HCT: 27.6 % — ABNORMAL LOW (ref 36.0–46.0)
HEMOGLOBIN: 6.6 g/dL — AB (ref 12.0–15.0)
HEMOGLOBIN: 8.6 g/dL — AB (ref 12.0–15.0)
MCH: 30.4 pg (ref 26.0–34.0)
MCH: 29.4 pg (ref 26.0–34.0)
MCHC: 31.4 g/dL (ref 30.0–36.0)
MCHC: 31.2 g/dL (ref 30.0–36.0)
MCV: 96.8 fL (ref 80.0–100.0)
MCV: 94.2 fL (ref 80.0–100.0)
NRBC: 0 % (ref 0.0–0.2)
PLATELETS: 154 10*3/uL (ref 150–400)
Platelets: 192 10*3/uL (ref 150–400)
RBC: 2.17 MIL/uL — AB (ref 3.87–5.11)
RBC: 2.93 MIL/uL — AB (ref 3.87–5.11)
RDW: 12.2 % (ref 11.5–15.5)
RDW: 14.1 % (ref 11.5–15.5)
WBC: 9.6 10*3/uL (ref 4.0–10.5)
WBC: 7.4 10*3/uL (ref 4.0–10.5)
nRBC: 0 % (ref 0.0–0.2)

## 2018-06-04 LAB — BPAM RBC
BLOOD PRODUCT EXPIRATION DATE: 201910292359
Blood Product Expiration Date: 201910292359
ISSUE DATE / TIME: 201910090848
ISSUE DATE / TIME: 201910091124
UNIT TYPE AND RH: 6200
UNIT TYPE AND RH: 6200

## 2018-06-04 LAB — TYPE AND SCREEN
ABO/RH(D): A POS
ANTIBODY SCREEN: NEGATIVE
UNIT DIVISION: 0
Unit division: 0

## 2018-06-04 LAB — BASIC METABOLIC PANEL
Anion gap: 9 (ref 5–15)
BUN: 35 mg/dL — ABNORMAL HIGH (ref 8–23)
CO2: 25 mmol/L (ref 22–32)
Calcium: 8.5 mg/dL — ABNORMAL LOW (ref 8.9–10.3)
Chloride: 106 mmol/L (ref 98–111)
Creatinine, Ser: 1.73 mg/dL — ABNORMAL HIGH (ref 0.44–1.00)
GFR calc Af Amer: 29 mL/min — ABNORMAL LOW (ref >60)
GFR, EST NON AFRICAN AMERICAN: 25 mL/min — AB (ref >60)
Glucose, Bld: 96 mg/dL (ref 70–99)
POTASSIUM: 4.1 mmol/L (ref 3.5–5.1)
Sodium: 140 mmol/L (ref 135–145)

## 2018-06-04 LAB — GLUCOSE, CAPILLARY
GLUCOSE-CAPILLARY: 110 mg/dL — AB (ref 70–99)
GLUCOSE-CAPILLARY: 107 mg/dL — AB (ref 70–99)
Glucose-Capillary: 95 mg/dL (ref 70–99)
Glucose-Capillary: 109 mg/dL — ABNORMAL HIGH (ref 70–99)

## 2018-06-04 LAB — MAGNESIUM: MAGNESIUM: 2.1 mg/dL (ref 1.7–2.4)

## 2018-06-04 MED ORDER — ASPIRIN 81 MG PO CHEW: 81.0000 mg | CHEWABLE_TABLET | Freq: Two times a day (BID) | ORAL | 0 refills | Status: DC

## 2018-06-04 NOTE — Progress Notes (Signed)
Subjective: Patient reports pain as mild.   No CP, SOB.  No dizziness or weakness.  Not yet OOB dt Anemia (received 2 Units PRBC 06/03/18).  Objective:   VITALS:   Vitals:   06/03/18 1144 06/03/18 1400 06/03/18 2042 06/04/18 0537  BP: (!) 101/43 103/61 119/65 (!) 142/61  Pulse: 66 68 67 75  Resp: 18 20 19 17   Temp: 98.1 F (36.7 C) 98.4 F (36.9 C) 97.6 F (36.4 C) 98.5 F (36.9 C)  TempSrc: Oral Oral    SpO2: 100% 98% 100% 99%  Weight:      Height:       CBC Latest Ref Rng & Units 06/04/2018 06/03/2018 06/03/2018  WBC 4.0 - 10.5 K/uL 7.4 - 9.6  Hemoglobin 12.0 - 15.0 g/dL 8.6(L) 8.9(L) 6.6(LL)  Hematocrit 36.0 - 46.0 % 27.6(L) 28.1(L) 21.0(L)  Platelets 150 - 400 K/uL 154 - 192   BMP Latest Ref Rng & Units 06/04/2018 06/03/2018 06/02/2018  Glucose 70 - 99 mg/dL 96 130(H) 122(H)  BUN 8 - 23 mg/dL 35(H) 32(H) 27(H)  Creatinine 0.44 - 1.00 mg/dL 1.73(H) 1.86(H) 1.60(H)  BUN/Creat Ratio 12 - 28 - - -  Sodium 135 - 145 mmol/L 140 140 141  Potassium 3.5 - 5.1 mmol/L 4.1 5.2(H) 4.6  Chloride 98 - 111 mmol/L 106 105 107  CO2 22 - 32 mmol/L 25 23 23   Calcium 8.9 - 10.3 mg/dL 8.5(L) 9.2 8.9   Intake/Output      10/09 0701 - 10/10 0700 10/10 0701 - 10/11 0700   P.O. 720    I.V. (mL/kg) 481.3 (8.3)    Blood 315    Other     IV Piggyback     Total Intake(mL/kg) 1516.3 (26.2)    Urine (mL/kg/hr) 700 (0.5)    Blood     Total Output 700    Net +816.3            Physical Exam: General: NAD.  Supine in bed on arrival.  Calm and interactive.  Responds appropriately to questions.  No increased work of breathing. MSK LLE: Surgical dressings in place, CDI. Sensation intact distally Feet warm Dorsiflexion/Plantar flexion intact  Assessment: 2 Days Post-Op  S/P Procedure(s) (LRB): INTRAMEDULLARY (IM) NAIL FEMORAL (Left) by Dr. Ernesta Amble. Percell Miller on 06/02/2018  Principal Problem:   Closed intertrochanteric fracture of left femur ([place]) Active Problems:   Diabetes  mellitus ([place])   Essential hypertension   GERD (gastroesophageal reflux disease)   OSA on CPAP   Falls frequently   Alzheimer's disease ([place])   Chronic kidney disease stage III   Cardiomyopathy, ischemic-EF 30-35% by echo 01/01/84   Chronic systolic CHF (congestive heart failure) (HCC)   Anemia of chronic disease   Hip fracture (HCC)   Closed left intertrochanteric femur fracture, status post IM nail Stable from an orthopedic perspective postop day 2 Hgb up to 8.6 from 6.6 after 2 units 06/03/18. Pain controlled Not yet mobilized with therapy  Plan: Up with therapy Incentive Spirometry Apply ice as needed  Weightbearing: WBAT LLE Insicional and dressing care: Dressings left intact until follow-up Showering: Keep dressing dry VTE prophylaxis: ASA 81 mg BID, SCDs, ambulation Pain control: Minimize narcotics.  Tylenol for mild pain. Follow - up plan: 2 weeks in the office with Dr. Leamon Arnt information:  Edmonia Lynch MD, Roxan Hockey PA-C  Dispo: Therapy evaluations pending.  Discharge per primary team when ready medically and mobilized.   Prudencio Burly III, PA-C 06/04/2018, 8:28  AM

## 2018-06-04 NOTE — Clinical Social Work Note (Signed)
Clinical Social Work Assessment  Patient Details  Name: Mary Cook MRN: [id number] Date of Birth: [birth year]  Date of referral:  06/04/18               Reason for consult:  Facility Placement                Permission sought to share information with:  Family Supports, Guardian Permission granted to share information::  Yes, Verbal Permission Granted  Name::     sisters Cleo and Turner::  [facility]  Relationship::  [place] DSS guardian [phone] ex [phone]  Contact Information:     Housing/Transportation Living arrangements for the past 2 months:  [place] of Information:  Patient, Siblings, Medical Team Patient Interpreter Needed:  None Criminal Activity/Legal Involvement Pertinent to Current Situation/Hospitalization:  No - Comment as needed Significant Relationships:  Siblings, Spouse, Community Support Lives with:  Facility Resident Do you feel safe going back to the place where you live?  Yes Need for family participation in patient care:  Yes (Comment)(sisters and DSS )  Care giving concerns:  Pt admitted from [facility] assisted living. Spouse lives there as well.  Pt has a DSS guardian with [facility] as family unable to make care decisions for her and she has Alzheimer's and long term care needs. Pt requires assistance with ambulating and ADLs at baseline. Fluctuating orientation, forgets names, location, not always oriented to situation.  Admitted for femur fracture, fell at facility- post op.    Social Worker assessment / plan:  CSW consulted to assist with disposition- SNF recommended. Discussed with pt and her sisters- also left voicemail for DSS guardian for input (contact # above). Pt and sisters state that guardian "may want to try to get her into [facility] since our other sister lives there." Pt has been to SNF in the past, hopes to be able to return to [facility] once functioning improves, however sisters  express they have considered that pt has been falling more often and care needs may be progressing past ALF in the future. Completed FL2 and made referrals- pt will need [facility] Medicare prior authorization once facility selected by guardian.  Employment status:  Retired Forensic scientist:  Managed Medicare([employer] Medicare (not Dietitian)) PT Recommendations:  [place] / Referral to community resources:  [place]  Patient/Family's Response to care:  Pt and sisters appreciative however are not able to discuss care in detail  Patient/Family's Understanding of and Emotional Response to Diagnosis, Current Treatment, and Prognosis:  Pt's understanding of treatment limited- states she fell and is in the hospital, states, "they want me to get into rehab somewhere." Emotionally very pleasant and gracious with CSW.  Emotional Assessment Appearance:  Appears stated age Attitude/Demeanor/Rapport:  Engaged(disoriented) Affect (typically observed):  Calm Orientation:  Oriented to Self, Oriented to Place Alcohol / Substance use:  Not Applicable Psych involvement (Current and /or in the community):  No (Comment)  Discharge Needs  Concerns to be addressed:  Discharge Planning Concerns, Care Coordination Readmission within the last 30 days:  Yes Current discharge risk:  Dependent with Mobility Barriers to Discharge:  Insurance Authorization   Nila Nephew, LCSW 06/04/2018, 4:34 PM [phone] coverage for [phone]

## 2018-06-04 NOTE — Evaluation (Signed)
Occupational Therapy Evaluation Patient Details Name: Mary Cook MRN: [id number] DOB: [birth year] Today's Date: 06/04/2018    History of Present Illness Pt is a 84 year old female admitted s/p fall sustaining Closed intertrochanteric fracture of left femur and currently s/p L IM nail on 06/02/18. PMHx significant for but not limited to CAD, DMII, CVA, T12 compression fracture.   Clinical Impression   Pt was admitted for the above fall/surgical repair.  She lives with her husband and he assists her with adls at baseline. Pt needs extensive assistance with adls and was unable to sit EOB without external support. Will follow in acute setting with mod +2 assist goals for toilet transfers and mobility for adls    Follow Up Recommendations  SNF    Equipment Recommendations  (to be further assessed)    Recommendations for Other Services       Precautions / Restrictions Precautions Precautions: Fall Restrictions Other Position/Activity Restrictions: WBAT      Mobility Bed Mobility Overal bed mobility: Needs Assistance Bed Mobility: Supine to Sit;Sit to Supine     Supine to sit: Max assist;+2 for physical assistance Sit to supine: Max assist;+2 for physical assistance   General bed mobility comments: assist for trunk and legs; cues given for sequenc  Transfers                 General transfer comment: NT; pt did not sit unsupported at EOB    Balance Overall balance assessment: Needs assistance Sitting-balance support: Feet supported;Bilateral upper extremity supported Sitting balance-Leahy Scale: Poor Sitting balance - Comments: min to max A for balance sitting EOB with bil UE and bil feet supported Postural control: Posterior lean                                 ADL either performed or assessed with clinical judgement   ADL Overall ADL's : Needs assistance/impaired Eating/Feeding: Set up   Grooming: Minimal assistance   Upper Body  Bathing: Maximal assistance   Lower Body Bathing: Total assistance;+2 for physical assistance   Upper Body Dressing : Moderate assistance   Lower Body Dressing: Total assistance;+2 for physical assistance;+2 for safety/equipment                 General ADL Comments: pt sat EOB with assistance; NT present and assisted with back to bed. Pt does not hold herself up without assistance; ADLs from bed level     Vision         Perception     Praxis      Pertinent Vitals/Pain Pain Assessment: No/denies pain     Hand Dominance     Extremity/Trunk Assessment Upper Extremity Assessment Upper Extremity Assessment: Generalized weakness          Communication Communication Communication: No difficulties   Cognition Arousal/Alertness: Awake/alert Behavior During Therapy: WFL for tasks assessed/performed Overall Cognitive Status: Impaired/Different from baseline                                 General Comments:  family reports pt is more confused today than baseline   General Comments       Exercises     Shoulder Instructions      Home Living Family/patient expects to be discharged to:: Skilled nursing facility  Home Equipment: [place] - 2 wheels;Cane - single point;Wheelchair - manual;Shower seat          Prior Functioning/Environment Level of Independence: Needs assistance  Gait / Transfers Assistance Needed: assisted by spouse, holds her arm and walks with her, assist out of bed, to bathroom, etc. ADL's / Homemaking Assistance Needed: assisted by spouse   Comments: family present during evaluation.  Spouse assists pt at baseline        OT Problem List: Decreased strength;Decreased activity tolerance;Impaired balance (sitting and/or standing);Decreased cognition;Decreased safety awareness      OT Treatment/Interventions: Self-care/ADL training;DME and/or AE instruction;Patient/family education;Balance  training;Therapeutic activities;Cognitive remediation/compensation    OT Goals(Current goals can be found in the care plan section) Acute Rehab OT Goals Patient Stated Goal: none stated; agreeable to working with therapy OT Goal Formulation: With family Time For Goal Achievement: 06/18/18 Potential to Achieve Goals: Fair ADL Goals Pt Will Transfer to Toilet: with mod assist;with +2 assist;stand pivot transfer;bedside commode Additional ADL Goal #1: pt will go from sit to stand with mod +2 assist for adls Additional ADL Goal #2: pt will perform bed mobility with mod A in preparation for toilet transfers  OT Frequency: Min 2X/week   Barriers to D/C:            Co-evaluation              AM-PAC PT "6 Clicks" Daily Activity     Outcome Measure Help from another person eating meals?: A Little Help from another person taking care of personal grooming?: A Little Help from another person toileting, which includes using toliet, bedpan, or urinal?: Total Help from another person bathing (including washing, rinsing, drying)?: A Lot Help from another person to put on and taking off regular upper body clothing?: A Lot Help from another person to put on and taking off regular lower body clothing?: Total 6 Click Score: 12   End of Session Nurse Communication: (bed alarm not working)  Activity Tolerance: Patient limited by fatigue Patient left: in bed;with call bell/phone within reach;with nursing/sitter in room;with family/visitor present  OT Visit Diagnosis: Muscle weakness (generalized) (M62.81)                Time: 2549-8264 OT Time Calculation (min): 22 min Charges:  OT General Charges $OT Visit: 1 Visit OT Evaluation $OT Eval Low Complexity: Kimberly, OTR/L Acute Rehabilitation Services [phone] [facility] pager [phone] office 06/04/2018  [place] 06/04/2018, 1:02 PM

## 2018-06-04 NOTE — Care Management Important Message (Signed)
Important Message  Patient Details  Name: Mary Cook MRN: [id number] Date of Birth: Dec 31, 1925   Medicare Important Message Given:  Yes    Kerin Salen 06/04/2018, 11:07 AMImportant Message  Patient Details  Name: Mary Cook MRN: [id number] Date of Birth: Jan 25, 1926   Medicare Important Message Given:  Yes    Kerin Salen 06/04/2018, 11:07 AM

## 2018-06-04 NOTE — Progress Notes (Signed)
Pt continues to refuse CPAP qhs.  Pt encouraged to have her RN contact RT should she change her mind.

## 2018-06-04 NOTE — Progress Notes (Signed)
PROGRESS NOTE    Mary Cook  QIH:[id number] DOB: May 16, 1926 DOA: 06/01/2018 PCP: Rosita Fire, MD   Brief Narrative:  84 year old with a past medical history of chronic combined systolic and diastolic congestive heart failure with ejection fraction 35%, CAD, diabetes mellitus type 2, obstructive sleep apnea, essential hypertension presented to the ER after sustaining a fall causing left hip fracture.  Patient's fall occurred at [facility].  She also sustained small left eyebrow laceration which was repaired in the ER.  X-ray showed intertrochanteric fracture of the left femur.  Orthopedic was consulted and she underwent intramedullary nailing of the left hip on 10/8 which she tolerated well.  Following day her hemoglobin had dropped to 6.6 without any obvious signs of bleeding.  This was likely secondary to dilution nature therefore 2 units of PRBC was ordered.  Her hemoglobin levels improved to 8.6 and she felt little stronger.   Assessment & Plan:   Principal Problem:   Closed intertrochanteric fracture of left femur (HCC) Active Problems:   Diabetes mellitus ([place])   Essential hypertension   GERD (gastroesophageal reflux disease)   OSA on CPAP   Falls frequently   Alzheimer's disease ([place])   Chronic kidney disease stage III   Cardiomyopathy, ischemic-EF 30-35% by echo 04/02/55   Chronic systolic CHF (congestive heart failure) (HCC)   Anemia of chronic disease   Hip fracture (HCC)   Mechanical fall causing left femur fracture Status post intramedullary nailing of the left hip 06/02/2018 - Tolerated the procedure well, doing well.  Pain control and bowel regimen as needed -PT OT consulted-awaiting their input. -Hemoglobin remained stable.  Lovenox stopped due to slight drop in hemoglobin.  Change to aspirin 81 mg twice daily per orthopedic at which will be continued for 35 days.  Will need to follow-up with them in about 2 weeks -Supportive care  Symptomatic  anemia, unknown exact etiology - Hemoglobin is stable at 8.6 after receiving 2 units of PRBC transfusion.  Coronary artery disease, stable History of combined systolic and diastolic congestive heart failure, ejection fraction 35% - Continue her home regimen of Coreg, digoxin, Lasix, isosorbide mononitrate and Entresto.  Currently she is chest pain-free.  Follow-up with outpatient cardiology -Aspirin for now has been increased to 81 mg twice a day, eventually will be transition to 81 mg daily  Essential hypertension -Continue blood pressure medications as stated above  GERD -PPI  Obstructive sleep apnea -CPAP  Chronic kidney disease stage III -Appears to be stable at 1.7.  Closely monitor this.  Diabetes mellitus type 2 -Continue Accu-Cheks and sliding scale   DVT prophylaxis: On aspirin 81 mg twice daily Code Status: DNR Family Communication:  None at bedside  Disposition Plan: Pending PT/OT evaluation.  Once this has been done, appropriate arrangements will be made for her to get rehabilitation.  Consultants:   Orthopedic  Procedures:   Left hip intramedullary nailing 10/8  Antimicrobials:   None besides perioperative   Subjective: No complaints this morning.  She only reports of left lower extremity pain is expected from her surgery.  Review of Systems General = no fevers, chills, dizziness, malaise, fatigue HEENT/EYES = negative for pain, redness, loss of vision, double vision, blurred vision, loss of hearing, sore throat, hoarseness, dysphagia Cardiovascular= negative for chest pain, palpitation, murmurs, lower extremity swelling Respiratory/lungs= negative for shortness of breath, cough, hemoptysis, wheezing, mucus production Gastrointestinal= negative for nausea, vomiting,, abdominal pain, melena, hematemesis Genitourinary= negative for Dysuria, Hematuria, Change in Urinary Frequency MSK =  Negative for arthralgia, myalgias, Back Pain, Joint swelling    Neurology= Negative for headache, seizures, numbness, tingling  Psychiatry= Negative for anxiety, depression, suicidal and homocidal ideation Allergy/Immunology= Medication/Food allergy as listed  Skin= Negative for Rash, lesions, ulcers, itching  Objective: Vitals:   06/03/18 1144 06/03/18 1400 06/03/18 2042 06/04/18 0537  BP: (!) 101/43 103/61 119/65 (!) 142/61  Pulse: 66 68 67 75  Resp: 18 20 19 17   Temp: 98.1 F (36.7 C) 98.4 F (36.9 C) 97.6 F (36.4 C) 98.5 F (36.9 C)  TempSrc: Oral Oral    SpO2: 100% 98% 100% 99%  Weight:      Height:        Intake/Output Summary (Last 24 hours) at 06/04/2018 1042 Last data filed at 06/03/2018 1758 Gross per 24 hour  Intake 1081.32 ml  Output 700 ml  Net 381.32 ml   Filed Weights   06/01/18 0440  Weight: 57.8 kg    Examination: Constitutional: NAD, calm, comfortable Eyes: PERRL, lids and conjunctivae normal ENMT: Mucous membranes are moist. Posterior pharynx clear of any exudate or lesions.Normal dentition.  Neck: normal, supple, no masses, no thyromegaly Respiratory: clear to auscultation bilaterally, no wheezing, no crackles. Normal respiratory effort. No accessory muscle use.  Cardiovascular: Regular rate and rhythm, no murmurs / rubs / gallops. No extremity edema. 2+ pedal pulses. No carotid bruits.  Abdomen: no tenderness, no masses palpated. No hepatosplenomegaly. Bowel sounds positive.  Musculoskeletal: no clubbing / cyanosis. No joint deformity upper and lower extremities. Good ROM, no contractures. Normal muscle tone.  Skin: no rashes, lesions, ulcers. No induration Neurologic: CN 2-12 grossly intact. Sensation intact, DTR normal. Strength 5/5 in all 4.  Psychiatric: Normal judgment and insight. Alert and oriented x 3. Normal mood.    Data Reviewed:   CBC: Recent Labs  Lab 06/01/18 0546 06/02/18 0630 06/03/18 0609 06/03/18 1652 06/04/18 0611  WBC 9.5 7.7 9.6  --  7.4  NEUTROABS 7.0  --   --   --   --   HGB  8.5* 8.5* 6.6* 8.9* 8.6*  HCT 26.7* 26.5* 21.0* 28.1* 27.6*  MCV 95.7 94.3 96.8  --  94.2  PLT 224 208 192  --  944   Basic Metabolic Panel: Recent Labs  Lab 05/28/18 1055 06/01/18 0546 06/02/18 0630 06/03/18 0533 06/04/18 0611  NA 142 140 141 140 140  K 4.6 3.7 4.6 5.2* 4.1  CL 106 109 107 105 106  CO2 22 25 23 23 25   GLUCOSE 132* 119* 122* 130* 96  BUN 28 24* 27* 32* 35*  CREATININE 1.61* 1.46* 1.60* 1.86* 1.73*  CALCIUM 9.4 8.8* 8.9 9.2 8.5*  MG  --   --   --   --  2.1   GFR: Estimated Creatinine Clearance: 19.4 mL/min (A) (by C-G formula based on SCr of 1.73 mg/dL (H)). Liver Function Tests: Recent Labs  Lab 06/01/18 0546  AST 15  ALT 14  ALKPHOS 50  BILITOT 0.5  PROT 6.3*  ALBUMIN 2.9*   No results for input(s): LIPASE, AMYLASE in the last 168 hours. No results for input(s): AMMONIA in the last 168 hours. Coagulation Profile: Recent Labs  Lab 06/01/18 0546  INR 1.07   Cardiac Enzymes: Recent Labs  Lab 06/02/18 1013  TROPONINI 0.03*   BNP (last 3 results) No results for input(s): PROBNP in the last 8760 hours. HbA1C: No results for input(s): HGBA1C in the last 72 hours. CBG: Recent Labs  Lab 06/03/18 0727 06/03/18 1114 06/03/18  1738 06/03/18 2043 06/04/18 0738  GLUCAP 119* 140* 171* 119* 95   Lipid Profile: No results for input(s): CHOL, HDL, LDLCALC, TRIG, CHOLHDL, LDLDIRECT in the last 72 hours. Thyroid Function Tests: No results for input(s): TSH, T4TOTAL, FREET4, T3FREE, THYROIDAB in the last 72 hours. Anemia Panel: No results for input(s): VITAMINB12, FOLATE, FERRITIN, TIBC, IRON, RETICCTPCT in the last 72 hours. Sepsis Labs: No results for input(s): PROCALCITON, LATICACIDVEN in the last 168 hours.  Recent Results (from the past 240 hour(s))  Surgical pcr screen     Status: None   Collection Time: 06/01/18 10:53 PM  Result Value Ref Range Status   MRSA, PCR NEGATIVE NEGATIVE Final   Staphylococcus aureus NEGATIVE NEGATIVE Final     Comment: (NOTE) The Xpert SA Assay (FDA approved for NASAL specimens in patients 24 years of age and older), is one component of a comprehensive surveillance program. It is not intended to diagnose infection nor to guide or monitor treatment. Performed at [facility], [place] [address] [place], [place] [address]          Radiology Studies: Dg C-arm 1-60 Min-no Report  Result Date: 06/02/2018 Fluoroscopy was utilized by the requesting physician.  No radiographic interpretation.   Dg Femur Min 2 Views Left  Result Date: 06/02/2018 CLINICAL DATA:  Internal fixation of left femur. EXAM: LEFT FEMUR 2 VIEWS FLUOROSCOPY TIME:  57 seconds. COMPARISON:  Radiographs of June 01, 2018. FINDINGS: Four intraoperative fluoroscopic images demonstrate the patient be status post intramedullary rod fixation of proximal left femoral fracture. Good alignment of fracture components is noted. IMPRESSION: Status post intramedullary rod fixation of proximal left femoral fracture. Electronically Signed   By: Marijo Conception, M.D.   On: 06/02/2018 14:19        Scheduled Meds: . aspirin  81 mg Oral BID  . carvedilol  3.125 mg Oral Daily  . digoxin  0.0625 mg Oral Daily  . docusate sodium  100 mg Oral BID  . ferrous sulfate  325 mg Oral Q breakfast  . furosemide  20 mg Oral QODAY  . guaiFENesin  600 mg Oral BID  . insulin aspart  0-9 Units Subcutaneous TID WC  . isosorbide mononitrate  15 mg Oral Daily  . mirtazapine  15 mg Oral QHS  . pantoprazole  40 mg Oral Daily  . potassium chloride SA  10 mEq Oral Daily  . sacubitril-valsartan  0.5 tablet Oral BID   Continuous Infusions: . lactated ringers 20 mL/hr at 06/03/18 1500     LOS: 3 days   Time spent= 40 mins    Ankit Arsenio Loader, MD [facility] Pager [phone]   If 7PM-7AM, please contact night-coverage [email or web address] Password [email or web address] 06/04/2018, 10:42 AM

## 2018-06-04 NOTE — Evaluation (Signed)
Physical Therapy Evaluation Patient Details Name: Mary Cook MRN: [id number] DOB: [birth year] Today's Date: 06/04/2018   History of Present Illness  Pt is a 84 year old female admitted s/p fall sustaining Closed intertrochanteric fracture of left femur and currently s/p L IM nail on 06/02/18. PMHx significant for but not limited to CAD, DMII, CVA, T12 compression fracture.  Clinical Impression  Patient is s/p above surgery resulting in functional limitations due to the deficits listed below (see PT Problem List).  Patient will benefit from skilled PT to increase their independence and safety with mobility to allow discharge to the venue listed below.  Pt assisted to sitting upright and tolerated sitting for few minutes however then reported dizziness.  Pt assisted back to supine.  Pt would benefit from post acute rehab for improving safe mobility and strengthening.       Follow Up Recommendations SNF;Supervision/Assistance - 24 hour    Equipment Recommendations  None recommended by PT    Recommendations for Other Services       Precautions / Restrictions Precautions Precautions: Fall Restrictions Other Position/Activity Restrictions: WBAT      Mobility  Bed Mobility Overal bed mobility: Needs Assistance Bed Mobility: Supine to Sit;Sit to Supine     Supine to sit: Max assist;+2 for physical assistance Sit to supine: Max assist;+2 for physical assistance   General bed mobility comments: verbal cues for technique, pt attempting to assist however pain limiting; increased assist for upper and lower body  Transfers                 General transfer comment: pt unable at this time, felt too weak, also reporting dizziness after sitting EOB a few minutes so assisted back to supine  Ambulation/Gait                Stairs            Wheelchair Mobility    Modified Rankin (Stroke Patients Only)       Balance Overall balance assessment: Needs  assistance Sitting-balance support: Feet supported;Bilateral upper extremity supported Sitting balance-Leahy Scale: Poor Sitting balance - Comments: pt can sit EOB with bil UE support however strongly relies on UE support, fatigues quickly and requires external assist Postural control: Posterior lean                                   Pertinent Vitals/Pain Pain Assessment: No/denies pain    Home Living Family/patient expects to be discharged to:: Assisted living               Home Equipment: [place] - 2 wheels;Cane - single point;Wheelchair - [employer] techn[employer]      Prior Function Level of Independence: Needs assistance   Gait / Transfers Assistance Needed: assisted by spouse, holds her arm and walks with her, assist out of bed, to bathroom, etc.  ADL's / Homemaking Assistance Needed: assisted by spouse  Comments: above per admission approx one week ago, pt confused this morning (possibly due to pain meds)     Hand Dominance        Extremity/Trunk Assessment   Upper Extremity Assessment Upper Extremity Assessment: Generalized weakness    Lower Extremity Assessment Lower Extremity Assessment: Generalized weakness;LLE deficits/detail LLE Deficits / Details: requiring assist for active movement due to pain       Communication   Communication: No difficulties  Cognition Arousal/Alertness: Awake/alert Behavior During Therapy: [facility]  for tasks assessed/performed Overall Cognitive Status: No family/caregiver present to determine baseline cognitive functioning                                 General Comments: nursing staff report pt is more confused today, decreased cognition likely due to pain meds      General Comments      Exercises     Assessment/Plan    PT Assessment Patient needs continued PT services  PT Problem List Decreased strength;Decreased mobility;Decreased activity tolerance;Pain;Decreased knowledge of use of  DME;Decreased balance       PT Treatment Interventions Gait training;Stair training;Functional mobility training;Therapeutic activities;Therapeutic exercise;Patient/family education;DME instruction;Balance training    PT Goals (Current goals can be found in the Care Plan section)  Acute Rehab PT Goals PT Goal Formulation: Patient unable to participate in goal setting Time For Goal Achievement: 06/18/18 Potential to Achieve Goals: Fair    Frequency Min 2X/week   Barriers to discharge        Co-evaluation               AM-PAC PT "6 Clicks" Daily Activity  Outcome Measure Difficulty turning over in bed (including adjusting bedclothes, sheets and blankets)?: Unable Difficulty moving from lying on back to sitting on the side of the bed? : Unable Difficulty sitting down on and standing up from a chair with arms (e.g., wheelchair, bedside commode, etc,.)?: Unable Help needed moving to and from a bed to chair (including a wheelchair)?: Total Help needed walking in hospital room?: Total Help needed climbing 3-5 steps with a railing? : Total 6 Click Score: 6    End of Session   Activity Tolerance: Patient tolerated treatment well Patient left: in bed;with call bell/phone within reach;with bed alarm set Nurse Communication: Mobility status PT Visit Diagnosis: Other abnormalities of gait and mobility (R26.89)    Time: 8546-2703 PT Time Calculation (min) (ACUTE ONLY): 19 min   Charges:   PT Evaluation $PT Eval Low Complexity: [place], PT, DPT Acute Rehabilitation Services Office: [phone] Pager: [phone]  Trena Platt 06/04/2018, 12:21 PM

## 2018-06-04 NOTE — [place] FL2 (Signed)
[place] MEDICAID FL2 LEVEL OF CARE SCREENING TOOL     IDENTIFICATION  Patient Name: Mary Cook Birthdate: 06-14-1926 Sex: female Admission Date (Current Location): 06/01/2018  [facility] and [place] Number:  Engineer, manufacturing systems and Address:  [facility],  [place] [address], [place]      Provider Number: [id number]  Attending Physician Name and Address:  Damita Lack, MD  Relative Name and Phone Number:       Current Level of Care: Hospital Recommended Level of Care: [place] Prior Approval Number:    Date Approved/Denied:   PASRR Number: [id number]  Discharge Plan: SNF    Current Diagnoses: Patient Active Problem List   Diagnosis Date Noted  . Closed intertrochanteric fracture of left femur ([place]) 06/02/2018  . Hip fracture ([place]) 06/01/2018  . HCAP (healthcare-associated pneumonia) 05/22/2018  . Elevated troponin 05/22/2018  . Sinus bradycardia 04/01/2018  . Anemia 03/31/2018  . Traumatic subarachnoid hemorrhage with loss of consciousness of 30 minutes or less ([place])   . Anemia of chronic disease 09/06/2016  . Gastroenteritis 07/22/2016  . Hip pain, chronic 06/03/2016  . Dyspnea 11/15/2015  . Acute respiratory failure with hypoxia ([place]) 11/15/2015  . CHF exacerbation ([place]) 11/15/2015  . Pain in the chest   . Chronic systolic CHF (congestive heart failure) ([place])   . Ischemic cardiomyopathy   . T12 compression fracture ([place])   . Intertrigo 02/28/2015  . Actinic keratoses 02/28/2015  . Noncompliance with diet and medication regimen 02/28/2015  . LBP (low back pain) 02/13/2015  . Acute on chronic renal insufficiency 02/04/2015  . Cardiomyopathy, ischemic-EF 30-35% by echo 01/31/15 02/04/2015  . Chronic kidney disease stage III 02/03/2015  . Acute on chronic combined systolic and diastolic CHF, NYHA class 3 ([place]) 01/30/2015  . Cystitis 10/28/2014  . Insomnia 10/28/2014  . Alzheimer's disease ([place]) 10/28/2014  .  Falls frequently 10/12/2014  . Leg weakness, bilateral 10/12/2014  . OSA on CPAP 09/05/2014  . Rapid palpitations 06/07/2014  . Acute chest pain 06/06/2014  . Chest pain 06/06/2014  . Thoracic back pain after fall  04/13/2013  . Stress at home 01/26/2013  . GERD (gastroesophageal reflux disease) 01/25/2013  . SOB (shortness of breath) 08/28/2011  . Abnormal EKG, marked new ant TWI this adm with negative Troponins,08/18/11 08/20/2011  . CAD S/P CFX PCI Sept 2011 08/18/2011  . Sleep apnea, cpap had been stopped secondary to "sinus issues" 08/18/2011  . [name] deficiency 03/20/2010  . Diabetes mellitus ([place]) 01/07/2008  . Hyperlipidemia 01/07/2008  . Essential hypertension 04/25/2007  . DIVERTICULOSIS, COLON 04/25/2007  . OSTEOARTHRITIS 04/25/2007  . Disorder of bone and cartilage 04/25/2007  . DIVERTICULITIS, HX OF 04/25/2007    Orientation RESPIRATION BLADDER Height & Weight     Self, Place  Normal Incontinent, External catheter Weight: 127 lb 6.8 oz (57.8 kg) Height:  5\' 5"  (165.1 cm)  BEHAVIORAL SYMPTOMS/MOOD NEUROLOGICAL BOWEL NUTRITION STATUS      Incontinent Diet(heart healthy/carb modified diet)  AMBULATORY STATUS COMMUNICATION OF NEEDS Skin   Extensive Assist Verbally Surgical wounds(closed incision left leg)                       Personal Care Assistance Level of Assistance  Bathing, Feeding, Dressing Bathing Assistance: Maximum assistance Feeding assistance: Independent Dressing Assistance: Limited assistance     Functional Limitations Info  Sight, Hearing, Speech Sight Info: Adequate Hearing Info: Adequate Speech Info: Adequate    SPECIAL CARE FACTORS  FREQUENCY  PT (By licensed PT), OT (By licensed OT)     PT Frequency: 5x OT Frequency: 3x            Contractures Contractures Info: Not present    Additional Factors Info  Code Status, Allergies Code Status Info: DNR Allergies Info: Clopidogrel Bisulfate, Lipitor Atorvastatin, Namenda Memantine  Hcl, Lisinopril           Current Medications (06/04/2018):  This is the current hospital active medication list Current Facility-Administered Medications  Medication Dose Route Frequency Provider Last Rate Last Dose  . acetaminophen (TYLENOL) tablet 650 mg  650 mg Oral Q6H PRN Prudencio Burly III, PA-C      . aspirin chewable tablet 81 mg  81 mg Oral BID Prudencio Burly III, PA-C   81 mg at 06/04/18 7829  . bisacodyl (DULCOLAX) suppository 10 mg  10 mg Rectal Daily PRN Prudencio Burly III, PA-C      . carvedilol (COREG) tablet 3.125 mg  3.125 mg Oral Daily Prudencio Burly III, PA-C   3.125 mg at 06/04/18 0926  . digoxin (LANOXIN) tablet 0.0625 mg  0.0625 mg Oral Daily Prudencio Burly III, PA-C   0.0625 mg at 06/04/18 5621  . docusate sodium (COLACE) capsule 100 mg  100 mg Oral BID Prudencio Burly III, PA-C   100 mg at 06/04/18 3086  . ferrous sulfate tablet 325 mg  325 mg Oral Q breakfast Prudencio Burly III, PA-C   325 mg at 06/04/18 5784  . furosemide (LASIX) tablet 20 mg  20 mg Oral QODAY Prudencio Burly III, PA-C   20 mg at 06/04/18 6962  . guaiFENesin (MUCINEX) 12 hr tablet 600 mg  600 mg Oral BID Prudencio Burly III, PA-C   600 mg at 06/04/18 9528  . hydrALAZINE (APRESOLINE) injection 10 mg  10 mg Intravenous Q4H PRN Prudencio Burly III, PA-C      . HYDROcodone-acetaminophen (NORCO/VICODIN) 5-325 MG per tablet 1-2 tablet  1-2 tablet Oral Q6H PRN Prudencio Burly III, PA-C   2 tablet at 06/03/18 2253  . insulin aspart (novoLOG) injection 0-9 Units  0-9 Units Subcutaneous TID WC Prudencio Burly III, PA-C   2 Units at 06/03/18 1740  . isosorbide mononitrate (IMDUR) 24 hr tablet 15 mg  15 mg Oral Daily Prudencio Burly III, PA-C   15 mg at 06/04/18 4132  . lactated ringers infusion   Intravenous Continuous Prudencio Burly III, PA-C 20 mL/hr at 06/03/18 1500    . metoCLOPramide  (REGLAN) tablet 5-10 mg  5-10 mg Oral Q8H PRN Prudencio Burly III, PA-C       Or  . metoCLOPramide (REGLAN) injection 5-10 mg  5-10 mg Intravenous Q8H PRN Prudencio Burly III, PA-C      . mirtazapine (REMERON) tablet 15 mg  15 mg Oral QHS Prudencio Burly III, PA-C   15 mg at 06/03/18 2131  . morphine 2 MG/ML injection 0.5-1 mg  0.5-1 mg Intravenous Q2H PRN Prudencio Burly III, PA-C   1 mg at 06/02/18 0543  . ondansetron (ZOFRAN) tablet 4 mg  4 mg Oral Q6H PRN Prudencio Burly III, PA-C       Or  . ondansetron [facility]) injection 4 mg  4 mg Intravenous Q6H PRN Prudencio Burly III, PA-C      . pantoprazole (PROTONIX) EC tablet 40 mg  40 mg Oral Daily Prudencio Burly III, PA-C  40 mg at 06/04/18 0927  . polyethylene glycol (MIRALAX / GLYCOLAX) packet 17 g  17 g Oral Daily PRN Prudencio Burly III, PA-C      . potassium chloride SA (K-DUR,KLOR-CON) CR tablet 10 mEq  10 mEq Oral Daily Prudencio Burly III, PA-C   10 mEq at 06/04/18 8241  . sacubitril-valsartan (ENTRESTO) 49-51 mg per tablet  0.5 tablet Oral BID Prudencio Burly III, PA-C   0.5 tablet at 06/04/18 7530  . sodium phosphate (FLEET) 7-19 GM/118ML enema 1 enema  1 enema Rectal Once PRN Prudencio Burly III, PA-C         Discharge Medications: Please see discharge summary for a list of discharge medications.  Relevant Imaging Results:  Relevant Lab Results:   Additional Information SSN [place], [place]

## 2018-06-05 DIAGNOSIS — S72145D Nondisplaced intertrochanteric fracture of left femur, subsequent encounter for closed fracture with routine healing: Secondary | ICD-10-CM

## 2018-06-05 LAB — CBC
HEMATOCRIT: 28.1 % — AB (ref 36.0–46.0)
Hemoglobin: 8.8 g/dL — ABNORMAL LOW (ref 12.0–15.0)
MCH: 29.2 pg (ref 26.0–34.0)
MCHC: 31.3 g/dL (ref 30.0–36.0)
MCV: 93.4 fL (ref 80.0–100.0)
NRBC: 0 % (ref 0.0–0.2)
PLATELETS: 184 10*3/uL (ref 150–400)
RBC: 3.01 MIL/uL — ABNORMAL LOW (ref 3.87–5.11)
RDW: 13.5 % (ref 11.5–15.5)
WBC: 7.8 10*3/uL (ref 4.0–10.5)

## 2018-06-05 LAB — BASIC METABOLIC PANEL
Anion gap: 10 (ref 5–15)
BUN: 34 mg/dL — AB (ref 8–23)
CHLORIDE: 107 mmol/L (ref 98–111)
CO2: 26 mmol/L (ref 22–32)
CREATININE: 1.63 mg/dL — AB (ref 0.44–1.00)
Calcium: 9.1 mg/dL (ref 8.9–10.3)
GFR calc Af Amer: 31 mL/min — ABNORMAL LOW (ref >60)
GFR calc non Af Amer: 27 mL/min — ABNORMAL LOW (ref >60)
GLUCOSE: 103 mg/dL — AB (ref 70–99)
POTASSIUM: 4.1 mmol/L (ref 3.5–5.1)
Sodium: 143 mmol/L (ref 135–145)

## 2018-06-05 LAB — GLUCOSE, CAPILLARY
GLUCOSE-CAPILLARY: 121 mg/dL — AB (ref 70–99)
Glucose-Capillary: 97 mg/dL (ref 70–99)
Glucose-Capillary: 164 mg/dL — ABNORMAL HIGH (ref 70–99)
Glucose-Capillary: 119 mg/dL — ABNORMAL HIGH (ref 70–99)

## 2018-06-05 NOTE — Progress Notes (Signed)
Pt. Refuses to wear CPAP at this time.  Requested pt. Call if she changes her mind.

## 2018-06-05 NOTE — Progress Notes (Signed)
CSW contacted patient's [place] guardianship worker, Rip Harbour Spell [phone] ext [phone]) to discuss facility options. She has chosen [facility] for patient at discharge. CSW contacted facility and began [employer].     Pricilla Holm, MSW, [facility] Clinical Social Worker [phone] (Weekend)

## 2018-06-05 NOTE — Progress Notes (Signed)
Subjective: Patient reports that she is not in pain.    Objective:   VITALS:   Vitals:   06/04/18 0537 06/04/18 1347 06/04/18 1947 06/05/18 0639  BP: (!) 142/61 126/75 (!) 154/68 (!) 160/76  Pulse: 75 74 81 85  Resp: 17 16 16  (!) 21  Temp: 98.5 F (36.9 C) 98.7 F (37.1 C) 97.8 F (36.6 C) (!) 97.5 F (36.4 C)  TempSrc:   Oral   SpO2: 99% 97% 95% 99%  Weight:      Height:       CBC Latest Ref Rng & Units 06/05/2018 06/04/2018 06/03/2018  WBC 4.0 - 10.5 K/uL 7.8 7.4 -  Hemoglobin 12.0 - 15.0 g/dL 8.8(L) 8.6(L) 8.9(L)  Hematocrit 36.0 - 46.0 % 28.1(L) 27.6(L) 28.1(L)  Platelets 150 - 400 K/uL 184 154 -   BMP Latest Ref Rng & Units 06/05/2018 06/04/2018 06/03/2018  Glucose 70 - 99 mg/dL 103(H) 96 130(H)  BUN 8 - 23 mg/dL 34(H) 35(H) 32(H)  Creatinine 0.44 - 1.00 mg/dL 1.63(H) 1.73(H) 1.86(H)  BUN/Creat Ratio 12 - 28 - - -  Sodium 135 - 145 mmol/L 143 140 140  Potassium 3.5 - 5.1 mmol/L 4.1 4.1 5.2(H)  Chloride 98 - 111 mmol/L 107 106 105  CO2 22 - 32 mmol/L 26 25 23   Calcium 8.9 - 10.3 mg/dL 9.1 8.5(L) 9.2   Intake/Output      10/10 0701 - 10/11 0700 10/11 0701 - 10/12 0700   P.O. 60    I.V. (mL/kg)     Blood     Total Intake(mL/kg) 60 (1)    Urine (mL/kg/hr) 1200 (0.9)    Emesis/NG output 0    Stool 0    Total Output 1200    Net -1140         Urine Occurrence 1 x    Stool Occurrence 1 x    Emesis Occurrence 0 x       Physical Exam: General: NAD.  Supine, sleeping in bed on arrival.  Calm and interactive.  Responds appropriately to questions.  No increased work of breathing. MSK LLE: Surgical incisions CDI. Sensation intact distally Feet warm Dorsiflexion/Plantar flexion intact  Assessment: 3 Days Post-Op  S/P Procedure(s) (LRB): INTRAMEDULLARY (IM) NAIL FEMORAL (Left) by Dr. Ernesta Amble. Percell Miller on 06/02/2018  Principal Problem:   Closed intertrochanteric fracture of left femur ([place]) Active Problems:   Diabetes mellitus ([place])   Essential  hypertension   GERD (gastroesophageal reflux disease)   OSA on CPAP   Falls frequently   Alzheimer's disease ([place])   Chronic kidney disease stage III   Cardiomyopathy, ischemic-EF 30-35% by echo 01/02/55   Chronic systolic CHF (congestive heart failure) (HCC)   Anemia of chronic disease   Hip fracture (HCC)   Closed left intertrochanteric femur fracture, status post IM nail Stable from an orthopedic perspective postop day 3 Hgb stable at 8.8 < 8.6 < 6.6.  Received 2 units PRBC 06/03/18. Pain controlled Early limited mobilization w/ therapy  Plan: Continue to mobilize Incentive Spirometry Apply ice as needed  Weightbearing: WBAT LLE  Insicional and dressing care: Dry Dressings prn  Showering: Keep dressing dry VTE prophylaxis: ASA 81 mg BID (changed from Lovenox dt anemia), SCDs, ambulation Pain control: Minimize narcotics.  Tylenol for mild pain. Follow - up plan: 2 weeks in the office with Dr. Leamon Arnt information:  Edmonia Lynch MD, Roxan Hockey PA-C  Dispo: SNF placement in process.  Discharge per primary team when ready medically.  Follow up in the office with Dr. Alain Marion in 2 weeks.  Please call with questions.   Charna Elizabeth Martensen III, PA-C 06/05/2018, 7:48 AM

## 2018-06-05 NOTE — Progress Notes (Signed)
PROGRESS NOTE    Mary Cook  CVE:[id number] DOB: 05-03-1926 DOA: 06/01/2018 PCP: Rosita Fire, MD   Brief Narrative: 84 year old with a past medical history of chronic combined systolic and diastolic congestive heart failure with ejection fraction 35%, CAD, diabetes mellitus type 2, obstructive sleep apnea, essential hypertension presented to the ER after sustaining a fall causing left hip fracture.  Patient's fall occurred at [facility] facility.  She also sustained small left eyebrow laceration which was repaired in the ER.  X-ray showed intertrochanteric fracture of the left femur.  Orthopedic was consulted and she underwent intramedullary nailing of the left hip on 10/8 which she tolerated well.  Following day her hemoglobin had dropped to 6.6 without any obvious signs of bleeding.  This was likely secondary to dilution nature therefore 2 units of PRBC was ordered.  Currently the plan is to discharge to skilled nursing facility as soon as the bed is available.  Waiting for insurance authorization.  Social worker following.  Assessment & Plan:   Principal Problem:   Closed intertrochanteric fracture of left femur (HCC) Active Problems:   Diabetes mellitus ([place])   Essential hypertension   GERD (gastroesophageal reflux disease)   OSA on CPAP   Falls frequently   Alzheimer's disease ([place])   Chronic kidney disease stage III   Cardiomyopathy, ischemic-EF 30-35% by echo 0/2/58   Chronic systolic CHF (congestive heart failure) (HCC)   Anemia of chronic disease   Hip fracture (HCC)   Mechanical fall/left femur fracture: Status post intramedullary nailing of the left hip on 06/02/2018.  Continue pain control and bowel regimen.  PT/OT recommended skilled nursing facility.  Continue aspirin 81 mg twice a day for DVT prophylaxis.  She will follow-up with orthopedics, Dr. Percell Miller, as an outpatient.  Anemia: Could be associated with surgery.  Hemoglobin stable after 2 units of  PRBC.  Coronary disease/history of combined chronic systolic/diastolic congestive heart failure: Last known ejection fraction of 35%.  Continue home regimen of Coreg, digoxin, Lasix, nitrate, Entresto. She  Is currently chest pain-free.  She will follow-up with cardiology in the outpatient.  Hypertension: Currently blood pressure stable.  Continue current medications  OSA: Continue CPAP  GERD: Continue PPI  CKD stage III: Currently kidney function is on baseline.  Diabetes type 2: Continue sliding scale insulin.   DVT prophylaxis: Aspirin Code Status: DNR Family Communication: None present at the bed side Disposition Plan: SNF soon as authorization is complete   Consultants: Orthopedics  Procedures: Intramedullary nailing of the hip  Antimicrobials: None  Subjective: Patient seen and examined the pressure this morning.  Remains comfortable.  No new issues.  Denies any pain  Objective: Vitals:   06/04/18 0537 06/04/18 1347 06/04/18 1947 06/05/18 0639  BP: (!) 142/61 126/75 (!) 154/68 (!) 160/76  Pulse: 75 74 81 85  Resp: 17 16 16  (!) 21  Temp: 98.5 F (36.9 C) 98.7 F (37.1 C) 97.8 F (36.6 C) (!) 97.5 F (36.4 C)  TempSrc:   Oral   SpO2: 99% 97% 95% 99%  Weight:      Height:        Intake/Output Summary (Last 24 hours) at 06/05/2018 1303 Last data filed at 06/05/2018 0900 Gross per 24 hour  Intake 120 ml  Output 1200 ml  Net -1080 ml   Filed Weights   06/01/18 0440  Weight: 57.8 kg    Examination:  General exam: Appears calm and comfortable ,Not in distress,average built HEENT:PERRL,Oral mucosa moist, Ear/Nose normal on  gross exam Respiratory system: Bilateral equal air entry, normal vesicular breath sounds, no wheezes or crackles  Cardiovascular system: S1 & S2 heard, RRR. No JVD, murmurs, rubs, gallops or clicks. No pedal edema. Gastrointestinal system: Abdomen is nondistended, soft and nontender. No organomegaly or masses felt. Normal bowel sounds  heard. Central nervous system: Alert and oriented. No focal neurological deficits. Extremities: No edema, no clubbing ,no cyanosis, distal peripheral pulses palpable.  Clean surgical wound on the left hip Skin: No rashes, lesions or ulcers,no icterus ,no pallor MSK: Normal muscle bulk,tone ,power Psychiatry: Judgement and insight appear normal. Mood & affect appropriate.     Data Reviewed: I have personally reviewed following labs and imaging studies  CBC: Recent Labs  Lab 06/01/18 0546 06/02/18 0630 06/03/18 0609 06/03/18 1652 06/04/18 0611 06/05/18 0600  WBC 9.5 7.7 9.6  --  7.4 7.8  NEUTROABS 7.0  --   --   --   --   --   HGB 8.5* 8.5* 6.6* 8.9* 8.6* 8.8*  HCT 26.7* 26.5* 21.0* 28.1* 27.6* 28.1*  MCV 95.7 94.3 96.8  --  94.2 93.4  PLT 224 208 192  --  154 237   Basic Metabolic Panel: Recent Labs  Lab 06/01/18 0546 06/02/18 0630 06/03/18 0533 06/04/18 0611 06/05/18 0600  NA 140 141 140 140 143  K 3.7 4.6 5.2* 4.1 4.1  CL 109 107 105 106 107  CO2 25 23 23 25 26   GLUCOSE 119* 122* 130* 96 103*  BUN 24* 27* 32* 35* 34*  CREATININE 1.46* 1.60* 1.86* 1.73* 1.63*  CALCIUM 8.8* 8.9 9.2 8.5* 9.1  MG  --   --   --  2.1  --    GFR: Estimated Creatinine Clearance: 20.6 mL/min (A) (by C-G formula based on SCr of 1.63 mg/dL (H)). Liver Function Tests: Recent Labs  Lab 06/01/18 0546  AST 15  ALT 14  ALKPHOS 50  BILITOT 0.5  PROT 6.3*  ALBUMIN 2.9*   No results for input(s): LIPASE, AMYLASE in the last 168 hours. No results for input(s): AMMONIA in the last 168 hours. Coagulation Profile: Recent Labs  Lab 06/01/18 0546  INR 1.07   Cardiac Enzymes: Recent Labs  Lab 06/02/18 1013  TROPONINI 0.03*   BNP (last 3 results) No results for input(s): PROBNP in the last 8760 hours. HbA1C: No results for input(s): HGBA1C in the last 72 hours. CBG: Recent Labs  Lab 06/04/18 1144 06/04/18 1605 06/04/18 2121 06/05/18 0736 06/05/18 1123  GLUCAP 110* 107* 109*  97 164*   Lipid Profile: No results for input(s): CHOL, HDL, LDLCALC, TRIG, CHOLHDL, LDLDIRECT in the last 72 hours. Thyroid Function Tests: No results for input(s): TSH, T4TOTAL, FREET4, T3FREE, THYROIDAB in the last 72 hours. Anemia Panel: No results for input(s): VITAMINB12, FOLATE, FERRITIN, TIBC, IRON, RETICCTPCT in the last 72 hours. Sepsis Labs: No results for input(s): PROCALCITON, LATICACIDVEN in the last 168 hours.  Recent Results (from the past 240 hour(s))  Surgical pcr screen     Status: None   Collection Time: 06/01/18 10:53 PM  Result Value Ref Range Status   MRSA, PCR NEGATIVE NEGATIVE Final   Staphylococcus aureus NEGATIVE NEGATIVE Final    Comment: (NOTE) The Xpert SA Assay (FDA approved for NASAL specimens in patients 12 years of age and older), is one component of a comprehensive surveillance program. It is not intended to diagnose infection nor to guide or monitor treatment. Performed at [facility], [address] [place],  [place] [address]          Radiology Studies: No results found.      Scheduled Meds: . aspirin  81 mg Oral BID  . carvedilol  3.125 mg Oral Daily  . digoxin  0.0625 mg Oral Daily  . docusate sodium  100 mg Oral BID  . ferrous sulfate  325 mg Oral Q breakfast  . furosemide  20 mg Oral QODAY  . guaiFENesin  600 mg Oral BID  . insulin aspart  0-9 Units Subcutaneous TID WC  . isosorbide mononitrate  15 mg Oral Daily  . mirtazapine  15 mg Oral QHS  . pantoprazole  40 mg Oral Daily  . potassium chloride SA  10 mEq Oral Daily  . sacubitril-valsartan  0.5 tablet Oral BID   Continuous Infusions: . lactated ringers 20 mL/hr at 06/03/18 1500     LOS: 4 days    Time spent: 25 mins.More than 50% of that time was spent in counseling and/or coordination of care.      Shelly Coss, MD [facility] Pager [phone]  If 7PM-7AM, please contact night-coverage [email or web address] Password  [email or web address] 06/05/2018, 1:03 PM

## 2018-06-06 DIAGNOSIS — I255 Ischemic cardiomyopathy: Secondary | ICD-10-CM | POA: Diagnosis not present

## 2018-06-06 DIAGNOSIS — L97429 Non-pressure chronic ulcer of left heel and midfoot with unspecified severity: Secondary | ICD-10-CM | POA: Diagnosis not present

## 2018-06-06 DIAGNOSIS — G301 Alzheimer's disease with late onset: Secondary | ICD-10-CM | POA: Diagnosis not present

## 2018-06-06 DIAGNOSIS — M8580 Other specified disorders of bone density and structure, unspecified site: Secondary | ICD-10-CM | POA: Diagnosis present

## 2018-06-06 DIAGNOSIS — K219 Gastro-esophageal reflux disease without esophagitis: Secondary | ICD-10-CM | POA: Diagnosis present

## 2018-06-06 DIAGNOSIS — R29810 Facial weakness: Secondary | ICD-10-CM | POA: Diagnosis not present

## 2018-06-06 DIAGNOSIS — G4733 Obstructive sleep apnea (adult) (pediatric): Secondary | ICD-10-CM | POA: Diagnosis not present

## 2018-06-06 DIAGNOSIS — Z9861 Coronary angioplasty status: Secondary | ICD-10-CM | POA: Diagnosis not present

## 2018-06-06 DIAGNOSIS — D631 Anemia in chronic kidney disease: Secondary | ICD-10-CM | POA: Diagnosis not present

## 2018-06-06 DIAGNOSIS — M6281 Muscle weakness (generalized): Secondary | ICD-10-CM | POA: Diagnosis not present

## 2018-06-06 DIAGNOSIS — I10 Essential (primary) hypertension: Secondary | ICD-10-CM | POA: Diagnosis not present

## 2018-06-06 DIAGNOSIS — I429 Cardiomyopathy, unspecified: Secondary | ICD-10-CM | POA: Diagnosis not present

## 2018-06-06 DIAGNOSIS — X58XXXD Exposure to other specified factors, subsequent encounter: Secondary | ICD-10-CM | POA: Diagnosis present

## 2018-06-06 DIAGNOSIS — E1122 Type 2 diabetes mellitus with diabetic chronic kidney disease: Secondary | ICD-10-CM | POA: Diagnosis not present

## 2018-06-06 DIAGNOSIS — M2550 Pain in unspecified joint: Secondary | ICD-10-CM | POA: Diagnosis not present

## 2018-06-06 DIAGNOSIS — R569 Unspecified convulsions: Secondary | ICD-10-CM | POA: Diagnosis not present

## 2018-06-06 DIAGNOSIS — I130 Hypertensive heart and chronic kidney disease with heart failure and stage 1 through stage 4 chronic kidney disease, or unspecified chronic kidney disease: Secondary | ICD-10-CM | POA: Diagnosis not present

## 2018-06-06 DIAGNOSIS — I2510 Atherosclerotic heart disease of native coronary artery without angina pectoris: Secondary | ICD-10-CM | POA: Diagnosis not present

## 2018-06-06 DIAGNOSIS — G309 Alzheimer's disease, unspecified: Secondary | ICD-10-CM | POA: Diagnosis not present

## 2018-06-06 DIAGNOSIS — S72012D Unspecified intracapsular fracture of left femur, subsequent encounter for closed fracture with routine healing: Secondary | ICD-10-CM | POA: Diagnosis not present

## 2018-06-06 DIAGNOSIS — S7292XD Unspecified fracture of left femur, subsequent encounter for closed fracture with routine healing: Secondary | ICD-10-CM | POA: Diagnosis not present

## 2018-06-06 DIAGNOSIS — R4182 Altered mental status, unspecified: Secondary | ICD-10-CM | POA: Diagnosis not present

## 2018-06-06 DIAGNOSIS — Z9989 Dependence on other enabling machines and devices: Secondary | ICD-10-CM | POA: Diagnosis not present

## 2018-06-06 DIAGNOSIS — F329 Major depressive disorder, single episode, unspecified: Secondary | ICD-10-CM | POA: Diagnosis not present

## 2018-06-06 DIAGNOSIS — Z66 Do not resuscitate: Secondary | ICD-10-CM | POA: Diagnosis not present

## 2018-06-06 DIAGNOSIS — Z8673 Personal history of transient ischemic attack (TIA), and cerebral infarction without residual deficits: Secondary | ICD-10-CM | POA: Diagnosis not present

## 2018-06-06 DIAGNOSIS — Z4732 Aftercare following explantation of hip joint prosthesis: Secondary | ICD-10-CM | POA: Diagnosis not present

## 2018-06-06 DIAGNOSIS — G9389 Other specified disorders of brain: Secondary | ICD-10-CM | POA: Diagnosis not present

## 2018-06-06 DIAGNOSIS — K5730 Diverticulosis of large intestine without perforation or abscess without bleeding: Secondary | ICD-10-CM | POA: Diagnosis present

## 2018-06-06 DIAGNOSIS — W19XXXA Unspecified fall, initial encounter: Secondary | ICD-10-CM | POA: Diagnosis not present

## 2018-06-06 DIAGNOSIS — D500 Iron deficiency anemia secondary to blood loss (chronic): Secondary | ICD-10-CM | POA: Diagnosis not present

## 2018-06-06 DIAGNOSIS — Z781 Physical restraint status: Secondary | ICD-10-CM | POA: Diagnosis not present

## 2018-06-06 DIAGNOSIS — E0822 Diabetes mellitus due to underlying condition with diabetic chronic kidney disease: Secondary | ICD-10-CM | POA: Diagnosis not present

## 2018-06-06 DIAGNOSIS — I5022 Chronic systolic (congestive) heart failure: Secondary | ICD-10-CM | POA: Diagnosis not present

## 2018-06-06 DIAGNOSIS — E785 Hyperlipidemia, unspecified: Secondary | ICD-10-CM | POA: Diagnosis present

## 2018-06-06 DIAGNOSIS — R410 Disorientation, unspecified: Secondary | ICD-10-CM | POA: Diagnosis not present

## 2018-06-06 DIAGNOSIS — L89623 Pressure ulcer of left heel, stage 3: Secondary | ICD-10-CM | POA: Diagnosis not present

## 2018-06-06 DIAGNOSIS — Z955 Presence of coronary angioplasty implant and graft: Secondary | ICD-10-CM | POA: Diagnosis not present

## 2018-06-06 DIAGNOSIS — S72145D Nondisplaced intertrochanteric fracture of left femur, subsequent encounter for closed fracture with routine healing: Secondary | ICD-10-CM | POA: Diagnosis not present

## 2018-06-06 DIAGNOSIS — F0280 Dementia in other diseases classified elsewhere without behavioral disturbance: Secondary | ICD-10-CM | POA: Diagnosis not present

## 2018-06-06 DIAGNOSIS — D638 Anemia in other chronic diseases classified elsewhere: Secondary | ICD-10-CM | POA: Diagnosis not present

## 2018-06-06 DIAGNOSIS — Z7401 Bed confinement status: Secondary | ICD-10-CM | POA: Diagnosis not present

## 2018-06-06 DIAGNOSIS — I447 Left bundle-branch block, unspecified: Secondary | ICD-10-CM | POA: Diagnosis not present

## 2018-06-06 DIAGNOSIS — F0390 Unspecified dementia without behavioral disturbance: Secondary | ICD-10-CM | POA: Diagnosis not present

## 2018-06-06 DIAGNOSIS — I5042 Chronic combined systolic (congestive) and diastolic (congestive) heart failure: Secondary | ICD-10-CM | POA: Diagnosis not present

## 2018-06-06 DIAGNOSIS — N183 Chronic kidney disease, stage 3 (moderate): Secondary | ICD-10-CM | POA: Diagnosis not present

## 2018-06-06 LAB — CBC
HCT: 26.8 % — ABNORMAL LOW (ref 36.0–46.0)
Hemoglobin: 8.4 g/dL — ABNORMAL LOW (ref 12.0–15.0)
MCH: 29.6 pg (ref 26.0–34.0)
MCHC: 31.3 g/dL (ref 30.0–36.0)
MCV: 94.4 fL (ref 80.0–100.0)
PLATELETS: 194 10*3/uL (ref 150–400)
RBC: 2.84 MIL/uL — AB (ref 3.87–5.11)
RDW: 13 % (ref 11.5–15.5)
WBC: 7.9 10*3/uL (ref 4.0–10.5)
nRBC: 0 % (ref 0.0–0.2)

## 2018-06-06 LAB — GLUCOSE, CAPILLARY
GLUCOSE-CAPILLARY: 104 mg/dL — AB (ref 70–99)
Glucose-Capillary: 153 mg/dL — ABNORMAL HIGH (ref 70–99)

## 2018-06-06 LAB — BASIC METABOLIC PANEL
Anion gap: 11 (ref 5–15)
BUN: 33 mg/dL — AB (ref 8–23)
CALCIUM: 9 mg/dL (ref 8.9–10.3)
CHLORIDE: 103 mmol/L (ref 98–111)
CO2: 26 mmol/L (ref 22–32)
CREATININE: 1.43 mg/dL — AB (ref 0.44–1.00)
GFR calc Af Amer: 36 mL/min — ABNORMAL LOW (ref >60)
GFR calc non Af Amer: 31 mL/min — ABNORMAL LOW (ref >60)
Glucose, Bld: 109 mg/dL — ABNORMAL HIGH (ref 70–99)
POTASSIUM: 4.3 mmol/L (ref 3.5–5.1)
SODIUM: 140 mmol/L (ref 135–145)

## 2018-06-06 MED ORDER — PANTOPRAZOLE SODIUM 40 MG PO TBEC: 40.0000 mg | DELAYED_RELEASE_TABLET | Freq: Every day | ORAL | 0 refills | Status: DC

## 2018-06-06 MED ORDER — POLYETHYLENE GLYCOL 3350 17 G PO PACK: 17.0000 g | PACK | Freq: Every day | ORAL | 0 refills | Status: AC | PRN

## 2018-06-06 MED ORDER — FERROUS SULFATE 325 (65 FE) MG PO TABS: 325.0000 mg | ORAL_TABLET | Freq: Every day | ORAL | 0 refills | Status: AC

## 2018-06-06 NOTE — Discharge Summary (Signed)
Physician Discharge Summary  BARBA SOLT HQI:[id number] DOB: 1926-05-27 DOA: 06/01/2018  PCP: Rosita Fire, MD  Admit date: 06/01/2018 Discharge date: 06/06/2018  Admitted From: Home Disposition:  SNF  Discharge Condition:Stable CODE STATUS:DNR Diet recommendation: Heart Healthy   Brief/Interim Summary: 84 year old with a past medical history of chronic combined systolic and diastolic congestive heart failure with ejection fraction 35%, CAD, diabetes mellitus type 2, obstructive sleep apnea, essential hypertension presented to the ER after sustaining a fall causing left hip fracture. Patient's fall occurred at [facility] facility. She also sustained small left eyebrow laceration which was repaired in the ER. X-ray showed intertrochanteric fracture of the left femur. Orthopedic was consulted and she underwent intramedullary nailing of the left hip on 10/8 which she tolerated well. Following day her hemoglobin had dropped to 6.6 without any obvious signs of bleeding. This was likely secondary to dilutional therefore 2 units of PRBC was ordered.  Currently the plan is to discharge to skilled nursing facility today.  Following problems were addressed during hospitalization:   Mechanical fall/left femur fracture: Status post intramedullary nailing of the left hip on 06/02/2018.  Continue pain control and bowel regimen.  PT/OT recommended skilled nursing facility.  Continue aspirin 81 mg twice a day for DVT prophylaxis.  She will follow-up with orthopedics, Dr. Percell Miller, as an outpatient.  Anemia: Could be associated with surgery.  Hemoglobin stable after 2 units of PRBC.  Coronary disease/history of combined chronic systolic/diastolic congestive heart failure: Last known ejection fraction of 35%.  Continue home regimen of Coreg, digoxin, Lasix, nitrate, Entresto. She  Is currently chest pain-free.  She will follow-up with cardiology in the outpatient.  Hypertension: Currently  blood pressure stable.  Continue current medications  OSA: Continue CPAP  GERD: Continue PPI  CKD stage III: Currently kidney function is on baseline.  Diabetes type 2: Continue home regimen.    Discharge Diagnoses:  Principal Problem:   Closed intertrochanteric fracture of left femur ([place]) Active Problems:   Diabetes mellitus ([place])   Essential hypertension   GERD (gastroesophageal reflux disease)   OSA on CPAP   Falls frequently   Alzheimer's disease ([place])   Chronic kidney disease stage III   Cardiomyopathy, ischemic-EF 30-35% by echo 08/28/22   Chronic systolic CHF (congestive heart failure) (HCC)   Anemia of chronic disease   Hip fracture [facility])    Discharge Instructions  Discharge Instructions    Diet - low sodium heart healthy   Complete by:  As directed    Discharge instructions   Complete by:  As directed    1) Follow up with orthopedics as an outpatient in 2 weeks.  Name and number of the provider has been attached. 2)Take prescribed medications as instructed. 3)Do  CBC and BMP tests in a week.   Increase activity slowly   Complete by:  As directed      Allergies as of 06/06/2018      Reactions   Clopidogrel Bisulfate Nausea And Vomiting   Patient is not aware of this allergy   Lipitor [atorvastatin] Other (See Comments)   Weak legs   Namenda [memantine Hcl]    N/v/d   Lisinopril Cough   Patient is not aware of this allergy      Medication List    STOP taking these medications   amoxicillin-clavulanate 500-125 MG tablet Commonly known as:  AUGMENTIN   aspirin 81 MG EC tablet Replaced by:  aspirin 81 MG chewable tablet     TAKE these medications  ACCU-CHEK AVIVA PLUS w/Device Kit Use to check blood sugars twice a day Dx. E11.9   acetaminophen 325 MG tablet Commonly known as:  TYLENOL Take 325 mg by mouth every 12 (twelve) hours as needed for mild pain or headache.   albuterol 108 (90 Base) MCG/ACT inhaler Commonly known as:   PROVENTIL HFA;VENTOLIN HFA Inhale 2 puffs into the lungs every 6 (six) hours as needed for wheezing or shortness of breath.   aspirin 81 MG chewable tablet Chew 1 tablet (81 mg total) by mouth 2 (two) times daily. Replaces:  aspirin 81 MG EC tablet   carvedilol 3.125 MG tablet Commonly known as:  COREG Take 1 tablet (3.125 mg total) by mouth daily.   digoxin 0.125 MG tablet Commonly known as:  LANOXIN Take 0.5 tablets (0.0625 mg total) by mouth daily.   docusate sodium 100 MG capsule Commonly known as:  COLACE Take 100 mg by mouth at bedtime.   ferrous sulfate 325 (65 FE) MG tablet Take 1 tablet (325 mg total) by mouth daily with breakfast. Start taking on:  06/07/2018   furosemide 20 MG tablet Commonly known as:  LASIX Take 1 tablet (20 mg total) by mouth every other day.   glipiZIDE 5 MG tablet Commonly known as:  GLUCOTROL Take 0.5 tablets (2.5 mg total) by mouth 2 (two) times daily before a meal.   guaiFENesin 600 MG 12 hr tablet Commonly known as:  MUCINEX Take 1 tablet (600 mg total) by mouth 2 (two) times daily.   guaiFENesin-dextromethorphan 100-10 MG/5ML syrup Commonly known as:  ROBITUSSIN DM Take 5 mLs by mouth 4 (four) times daily as needed for cough.   HYDROcodone-acetaminophen 5-325 MG tablet Commonly known as:  NORCO/VICODIN Take 1-2 tablets by mouth every 6 (six) hours as needed for moderate pain.   isosorbide mononitrate 30 MG 24 hr tablet Commonly known as:  IMDUR Take 0.5 tablets (15 mg total) by mouth daily.   mirtazapine 15 MG tablet Commonly known as:  REMERON Take 15 mg by mouth at bedtime.   pantoprazole 40 MG tablet Commonly known as:  PROTONIX Take 1 tablet (40 mg total) by mouth daily. Start taking on:  06/07/2018   polyethylene glycol packet Commonly known as:  MIRALAX / GLYCOLAX Take 17 g by mouth daily as needed for mild constipation.   potassium chloride SA 20 MEQ tablet Commonly known as:  K-DUR,KLOR-CON Take 1 tablet (20  mEq total) by mouth every other day.   sacubitril-valsartan 49-51 MG Commonly known as:  ENTRESTO Take 0.5 tablets by mouth 2 (two) times daily.      Follow-up Information    Renette Butters, MD Follow up in 2 week(s).   Specialty:  Orthopedic Surgery Contact information: [address] [place] [phone] [phone]          Allergies  Allergen Reactions  . Clopidogrel Bisulfate Nausea And Vomiting    Patient is not aware of this allergy  . Lipitor [Atorvastatin] Other (See Comments)    Weak legs  . Namenda [Memantine Hcl]     N/v/d  . Lisinopril Cough    Patient is not aware of this allergy    Consultations:  Orthopedics   Procedures/Studies: Dg Chest 1 View  Result Date: 06/01/2018 CLINICAL DATA:  Fall tonight with left hip pain.  Hip fracture. EXAM: CHEST  1 VIEW COMPARISON:  Radiograph 05/22/2018.  CT 10/29/2015 FINDINGS: The cardiomediastinal contours are normal. Mild cardiomegaly with aortic atherosclerosis. Pulmonary vasculature is  normal. No consolidation, pleural effusion, or pneumothorax. Left lateral sixth rib fracture of uncertain acuity. No acute osseous abnormalities are seen. IMPRESSION: 1. Indeterminate left lateral sixth rib fracture. No pulmonary complication. 2. Mild cardiomegaly with Aortic Atherosclerosis (ICD10-I70.0). Electronically Signed   By: Keith Rake M.D.   On: 06/01/2018 06:30   Dg Chest 2 View  Result Date: 05/08/2018 CLINICAL DATA:  Dyspnea. EXAM: CHEST - 2 VIEW COMPARISON:  Radiographs of March 31, 2018. FINDINGS: Stable cardiomegaly. Atherosclerosis of thoracic aorta is noted. Mild bibasilar subsegmental atelectasis or edema is noted with small pleural effusions. No pneumothorax is noted. Bony thorax is unremarkable. IMPRESSION: Interval development of mild bibasilar subsegmental atelectasis or edema with small pleural effusions. Aortic Atherosclerosis (ICD10-I70.0). Electronically Signed   By: Marijo Conception,  M.D.   On: 05/08/2018 14:32   Ct Head Wo Contrast  Result Date: 06/01/2018 CLINICAL DATA:  Fall in nursing home striking head. Laceration to eyebrow. EXAM: CT HEAD WITHOUT CONTRAST CT CERVICAL SPINE WITHOUT CONTRAST TECHNIQUE: Multidetector CT imaging of the head and cervical spine was performed following the standard protocol without intravenous contrast. Multiplanar CT image reconstructions of the cervical spine were also generated. COMPARISON:  Head CT 03/31/2018. Head and cervical spine CT 01/22/2017 FINDINGS: CT HEAD FINDINGS Brain: No intracranial hemorrhage. No subdural or extra-axial fluid collection. Generalized atrophy with ex vacuo dilatation of the right lateral ventricle. Multifocal remote infarcts in the right MCA distribution, remote right basal gangliar infarcts. Remote left cerebellar infarct. The basilar cisterns are patent. Vascular: Atherosclerosis of skullbase vasculature without hyperdense vessel or abnormal calcification. Skull: No fracture or focal lesion. Sinuses/Orbits: New opacification of sphenoid sinus, ethmoid sinuses, and maxillary sinuses with fluid levels. New partial opacification of right mastoid air cells. No visualized fracture. Other: None. CT CERVICAL SPINE FINDINGS Alignment: Trace anterolisthesis of C4 on C5 and C7 on T1, unchanged from prior exam. No traumatic subluxation. Skull base and vertebrae: No acute fracture. Vertebral body heights are maintained. The dens and skull base are intact. Soft tissues and spinal canal: No prevertebral fluid or swelling. No visible canal hematoma. Disc levels: Disc space narrowing and endplate spurring most prominent at C5-C6 and C6-C7. Multilevel facet arthropathy. Upper chest: Biapical pleuroparenchymal scarring. No acute findings. Other: None. IMPRESSION: 1.  No acute intracranial abnormality.  No skull fracture. 2. Unchanged atrophy and multifocal remote ischemia. 3. Near pan sinusitis, new since CT 2 months ago. 4. Degenerative  change in the cervical spine without acute fracture. Electronically Signed   By: Keith Rake M.D.   On: 06/01/2018 06:17   Ct Cervical Spine Wo Contrast  Result Date: 06/01/2018 CLINICAL DATA:  Fall in nursing home striking head. Laceration to eyebrow. EXAM: CT HEAD WITHOUT CONTRAST CT CERVICAL SPINE WITHOUT CONTRAST TECHNIQUE: Multidetector CT imaging of the head and cervical spine was performed following the standard protocol without intravenous contrast. Multiplanar CT image reconstructions of the cervical spine were also generated. COMPARISON:  Head CT 03/31/2018. Head and cervical spine CT 01/22/2017 FINDINGS: CT HEAD FINDINGS Brain: No intracranial hemorrhage. No subdural or extra-axial fluid collection. Generalized atrophy with ex vacuo dilatation of the right lateral ventricle. Multifocal remote infarcts in the right MCA distribution, remote right basal gangliar infarcts. Remote left cerebellar infarct. The basilar cisterns are patent. Vascular: Atherosclerosis of skullbase vasculature without hyperdense vessel or abnormal calcification. Skull: No fracture or focal lesion. Sinuses/Orbits: New opacification of sphenoid sinus, ethmoid sinuses, and maxillary sinuses with fluid levels. New partial opacification of right mastoid air cells.  No visualized fracture. Other: None. CT CERVICAL SPINE FINDINGS Alignment: Trace anterolisthesis of C4 on C5 and C7 on T1, unchanged from prior exam. No traumatic subluxation. Skull base and vertebrae: No acute fracture. Vertebral body heights are maintained. The dens and skull base are intact. Soft tissues and spinal canal: No prevertebral fluid or swelling. No visible canal hematoma. Disc levels: Disc space narrowing and endplate spurring most prominent at C5-C6 and C6-C7. Multilevel facet arthropathy. Upper chest: Biapical pleuroparenchymal scarring. No acute findings. Other: None. IMPRESSION: 1.  No acute intracranial abnormality.  No skull fracture. 2. Unchanged  atrophy and multifocal remote ischemia. 3. Near pan sinusitis, new since CT 2 months ago. 4. Degenerative change in the cervical spine without acute fracture. Electronically Signed   By: Keith Rake M.D.   On: 06/01/2018 06:17   Dg Chest Portable 1 View  Result Date: 05/22/2018 CLINICAL DATA:  Fever. EXAM: PORTABLE CHEST 1 VIEW COMPARISON:  May 08, 2018 FINDINGS: Stable cardiomegaly. The hila and mediastinum are unchanged. No pneumothorax. No pulmonary nodules or masses. No focal infiltrates. IMPRESSION: No active disease. Electronically Signed   By: Dorise Bullion III M.D   On: 05/22/2018 21:00   Dg C-arm 1-60 Min-no Report  Result Date: 06/02/2018 Fluoroscopy was utilized by the requesting physician.  No radiographic interpretation.   Dg Hip Unilat With Pelvis 2-3 Views Left  Result Date: 06/01/2018 CLINICAL DATA:  Fall tonight with left hip pain. EXAM: DG HIP (WITH OR WITHOUT PELVIS) 2-3V LEFT COMPARISON:  None. FINDINGS: Comminuted displaced intertrochanteric left femur fracture with apex lateral and anterior angulation. Femoral head remains seated. No additional fracture of the pelvis. The pubic rami are intact. IMPRESSION: Comminuted, angulated, and displaced intertrochanteric left femur fracture. Electronically Signed   By: Keith Rake M.D.   On: 06/01/2018 06:28   Dg Femur Min 2 Views Left  Result Date: 06/02/2018 CLINICAL DATA:  Internal fixation of left femur. EXAM: LEFT FEMUR 2 VIEWS FLUOROSCOPY TIME:  57 seconds. COMPARISON:  Radiographs of June 01, 2018. FINDINGS: Four intraoperative fluoroscopic images demonstrate the patient be status post intramedullary rod fixation of proximal left femoral fracture. Good alignment of fracture components is noted. IMPRESSION: Status post intramedullary rod fixation of proximal left femoral fracture. Electronically Signed   By: Marijo Conception, M.D.   On: 06/02/2018 14:19       Subjective: Patient seen and examined the bedside  this morning.  Remains comfortable.  No active issues.  Stable for discharge to skilled nursing facility today.  Discharge Exam: Vitals:   06/05/18 1945 06/06/18 0601  BP: (!) 147/70 (!) 164/69  Pulse: 81 88  Resp: 20 20  Temp: 98 F (36.7 C)   SpO2: 98% 97%   Vitals:   06/05/18 0639 06/05/18 1352 06/05/18 1945 06/06/18 0601  BP: (!) 160/76 137/63 (!) 147/70 (!) 164/69  Pulse: 85 84 81 88  Resp: (!) _0 Temp: (!) 97.5 F (36.4 C) 98.2 F (36.8 C) 98 F (36.7 C)   TempSrc:  Oral Oral   SpO2: 99% 98% 98% 97%  Weight:      Height:        General: Pt is alert, awake, not in acute distress Cardiovascular: RRR, S1/S2 +, no rubs, no gallops Respiratory: CTA bilaterally, no wheezing, no rhonchi Abdominal: Soft, NT, ND, bowel sounds + Extremities: no edema, no cyanosis    The results of significant diagnostics from this hospitalization (including imaging, microbiology, ancillary and laboratory) are listed  below for reference.     Microbiology: Recent Results (from the past 240 hour(s))  Surgical pcr screen     Status: None   Collection Time: 06/01/18 10:53 PM  Result Value Ref Range Status   MRSA, PCR NEGATIVE NEGATIVE Final   Staphylococcus aureus NEGATIVE NEGATIVE Final    Comment: (NOTE) The Xpert SA Assay (FDA approved for NASAL specimens in patients 85 years of age and older), is one component of a comprehensive surveillance program. It is not intended to diagnose infection nor to guide or monitor treatment. Performed at [facility], [place] [address], [place], [place] [address]      Labs: BNP (last 3 results) Recent Labs    05/08/18 1406  BNP 4,967.5*   Basic Metabolic Panel: Recent Labs  Lab 06/02/18 0630 06/03/18 0533 06/04/18 0611 06/05/18 0600 06/06/18 0749  NA 141 140 140 143 140  K 4.6 5.2* 4.1 4.1 4.3  CL 107 105 106 107 103  CO2 _0 GLUCOSE 122* 130* 96 103* 109*  BUN 27* 32* 35* 34* 33*  CREATININE  1.60* 1.86* 1.73* 1.63* 1.43*  CALCIUM 8.9 9.2 8.5* 9.1 9.0  MG  --   --  2.1  --   --    Liver Function Tests: Recent Labs  Lab 06/01/18 0546  AST 15  ALT 14  ALKPHOS 50  BILITOT 0.5  PROT 6.3*  ALBUMIN 2.9*   No results for input(s): LIPASE, AMYLASE in the last 168 hours. No results for input(s): AMMONIA in the last 168 hours. CBC: Recent Labs  Lab 06/01/18 0546 06/02/18 0630 06/03/18 0609 06/03/18 1652 06/04/18 0611 06/05/18 0600 06/06/18 0749  WBC 9.5 7.7 9.6  --  7.4 7.8 7.9  NEUTROABS 7.0  --   --   --   --   --   --   HGB 8.5* 8.5* 6.6* 8.9* 8.6* 8.8* 8.4*  HCT 26.7* 26.5* 21.0* 28.1* 27.6* 28.1* 26.8*  MCV 95.7 94.3 96.8  --  94.2 93.4 94.4  PLT 224 208 192  --  154 184 194   Cardiac Enzymes: Recent Labs  Lab 06/02/18 1013  TROPONINI 0.03*   BNP: Invalid input(s): POCBNP CBG: Recent Labs  Lab 06/05/18 0736 06/05/18 1123 06/05/18 1606 06/05/18 2140 06/06/18 0805  GLUCAP 97 164* 121* 119* 104*   D-Dimer No results for input(s): DDIMER in the last 72 hours. Hgb A1c No results for input(s): HGBA1C in the last 72 hours. Lipid Profile No results for input(s): CHOL, HDL, LDLCALC, TRIG, CHOLHDL, LDLDIRECT in the last 72 hours. Thyroid function studies No results for input(s): TSH, T4TOTAL, T3FREE, THYROIDAB in the last 72 hours.  Invalid input(s): FREET3 Anemia work up No results for input(s): VITAMINB12, FOLATE, FERRITIN, TIBC, IRON, RETICCTPCT in the last 72 hours. Urinalysis    Component Value Date/Time   COLORURINE YELLOW 05/22/2018 2051   APPEARANCEUR CLEAR 05/22/2018 2051   LABSPEC 1.013 05/22/2018 2051   PHURINE 6.0 05/22/2018 2051   GLUCOSEU NEGATIVE 05/22/2018 2051   GLUCOSEU NEGATIVE 10/12/2014 1053   HGBUR NEGATIVE 05/22/2018 2051   [place] NEGATIVE 05/22/2018 2051   [place] NEGATIVE 05/22/2018 2051   PROTEINUR NEGATIVE 05/22/2018 2051   UROBILINOGEN 1.0 04/28/2015 1658   NITRITE NEGATIVE 05/22/2018 2051   LEUKOCYTESUR  NEGATIVE 05/22/2018 2051   Sepsis Labs Invalid input(s): PROCALCITONIN,  WBC,  LACTICIDVEN Microbiology Recent Results (from the past 240 hour(s))  Surgical pcr screen     Status: None  Collection Time: 06/01/18 10:53 PM  Result Value Ref Range Status   MRSA, PCR NEGATIVE NEGATIVE Final   Staphylococcus aureus NEGATIVE NEGATIVE Final    Comment: (NOTE) The Xpert SA Assay (FDA approved for NASAL specimens in patients 10 years of age and older), is one component of a comprehensive surveillance program. It is not intended to diagnose infection nor to guide or monitor treatment. Performed at [facility], [place] [address], [place], [place] [address]     Please note: You were cared for by a hospitalist during your hospital stay. Once you are discharged, your primary care physician will handle any further medical issues. Please note that NO REFILLS for any discharge medications will be authorized once you are discharged, as it is imperative that you return to your primary care physician (or establish a relationship with a primary care physician if you do not have one) for your post hospital discharge needs so that they can reassess your need for medications and monitor your lab values.    Time coordinating discharge: 40 minutes  SIGNED:   Shelly Coss, MD  [facility] 06/06/2018, 11:08 AM Pager [phone]  If 7PM-7AM, please contact night-coverage [email or web address] Password [email or web address]

## 2018-06-06 NOTE — Progress Notes (Signed)
Nurse called pt's sister Phillis Knack to inform her about pt's discharge to [facility] today.  Pt's sister stated that she will go to center and sign pt in.  Nurse called report to Andee Poles at [facility]. No concerns prior to discharge.

## 2018-06-08 DIAGNOSIS — D500 Iron deficiency anemia secondary to blood loss (chronic): Secondary | ICD-10-CM | POA: Diagnosis not present

## 2018-06-08 DIAGNOSIS — S72012D Unspecified intracapsular fracture of left femur, subsequent encounter for closed fracture with routine healing: Secondary | ICD-10-CM | POA: Diagnosis not present

## 2018-06-08 DIAGNOSIS — I5022 Chronic systolic (congestive) heart failure: Secondary | ICD-10-CM | POA: Diagnosis not present

## 2018-06-08 DIAGNOSIS — I10 Essential (primary) hypertension: Secondary | ICD-10-CM | POA: Diagnosis not present

## 2018-06-09 DIAGNOSIS — S72012D Unspecified intracapsular fracture of left femur, subsequent encounter for closed fracture with routine healing: Secondary | ICD-10-CM | POA: Diagnosis not present

## 2018-06-09 DIAGNOSIS — G309 Alzheimer's disease, unspecified: Secondary | ICD-10-CM | POA: Diagnosis not present

## 2018-06-09 DIAGNOSIS — I255 Ischemic cardiomyopathy: Secondary | ICD-10-CM | POA: Diagnosis not present

## 2018-06-09 DIAGNOSIS — I10 Essential (primary) hypertension: Secondary | ICD-10-CM | POA: Diagnosis not present

## 2018-06-15 DIAGNOSIS — S72012D Unspecified intracapsular fracture of left femur, subsequent encounter for closed fracture with routine healing: Secondary | ICD-10-CM | POA: Diagnosis not present

## 2018-06-17 DIAGNOSIS — S72012D Unspecified intracapsular fracture of left femur, subsequent encounter for closed fracture with routine healing: Secondary | ICD-10-CM | POA: Diagnosis not present

## 2018-06-19 ENCOUNTER — Observation Stay (HOSPITAL_COMMUNITY): Payer: Medicare HMO

## 2018-06-19 ENCOUNTER — Encounter (HOSPITAL_COMMUNITY): Payer: Self-pay

## 2018-06-19 ENCOUNTER — Emergency Department (HOSPITAL_COMMUNITY): Payer: Medicare HMO

## 2018-06-19 ENCOUNTER — Other Ambulatory Visit: Payer: Self-pay

## 2018-06-19 ENCOUNTER — Inpatient Hospital Stay (HOSPITAL_COMMUNITY)
Admission: EM | Admit: 2018-06-19 | Discharge: 2018-06-25 | DRG: 070 | Disposition: A | Discharge status: Skilled Nursing Facility | Payer: Medicare HMO | Attending: Internal Medicine | Admitting: Internal Medicine

## 2018-06-19 DIAGNOSIS — M8580 Other specified disorders of bone density and structure, unspecified site: Secondary | ICD-10-CM | POA: Diagnosis present

## 2018-06-19 DIAGNOSIS — K5730 Diverticulosis of large intestine without perforation or abscess without bleeding: Secondary | ICD-10-CM | POA: Diagnosis present

## 2018-06-19 DIAGNOSIS — I130 Hypertensive heart and chronic kidney disease with heart failure and stage 1 through stage 4 chronic kidney disease, or unspecified chronic kidney disease: Secondary | ICD-10-CM | POA: Diagnosis present

## 2018-06-19 DIAGNOSIS — I447 Left bundle-branch block, unspecified: Secondary | ICD-10-CM | POA: Diagnosis present

## 2018-06-19 DIAGNOSIS — E0822 Diabetes mellitus due to underlying condition with diabetic chronic kidney disease: Secondary | ICD-10-CM | POA: Diagnosis not present

## 2018-06-19 DIAGNOSIS — I255 Ischemic cardiomyopathy: Secondary | ICD-10-CM | POA: Diagnosis present

## 2018-06-19 DIAGNOSIS — R4182 Altered mental status, unspecified: Secondary | ICD-10-CM | POA: Diagnosis present

## 2018-06-19 DIAGNOSIS — K219 Gastro-esophageal reflux disease without esophagitis: Secondary | ICD-10-CM | POA: Diagnosis present

## 2018-06-19 DIAGNOSIS — Z79899 Other long term (current) drug therapy: Secondary | ICD-10-CM

## 2018-06-19 DIAGNOSIS — R569 Unspecified convulsions: Secondary | ICD-10-CM | POA: Diagnosis present

## 2018-06-19 DIAGNOSIS — G309 Alzheimer's disease, unspecified: Secondary | ICD-10-CM | POA: Diagnosis present

## 2018-06-19 DIAGNOSIS — E785 Hyperlipidemia, unspecified: Secondary | ICD-10-CM | POA: Diagnosis present

## 2018-06-19 DIAGNOSIS — D638 Anemia in other chronic diseases classified elsewhere: Secondary | ICD-10-CM | POA: Diagnosis not present

## 2018-06-19 DIAGNOSIS — E1122 Type 2 diabetes mellitus with diabetic chronic kidney disease: Secondary | ICD-10-CM | POA: Diagnosis present

## 2018-06-19 DIAGNOSIS — F0280 Dementia in other diseases classified elsewhere without behavioral disturbance: Secondary | ICD-10-CM | POA: Diagnosis present

## 2018-06-19 DIAGNOSIS — Z781 Physical restraint status: Secondary | ICD-10-CM

## 2018-06-19 DIAGNOSIS — Z9181 History of falling: Secondary | ICD-10-CM

## 2018-06-19 DIAGNOSIS — I10 Essential (primary) hypertension: Secondary | ICD-10-CM | POA: Diagnosis not present

## 2018-06-19 DIAGNOSIS — M2550 Pain in unspecified joint: Secondary | ICD-10-CM | POA: Diagnosis not present

## 2018-06-19 DIAGNOSIS — Z90710 Acquired absence of both cervix and uterus: Secondary | ICD-10-CM

## 2018-06-19 DIAGNOSIS — F0390 Unspecified dementia without behavioral disturbance: Secondary | ICD-10-CM | POA: Diagnosis not present

## 2018-06-19 DIAGNOSIS — Z7401 Bed confinement status: Secondary | ICD-10-CM | POA: Diagnosis not present

## 2018-06-19 DIAGNOSIS — Z7984 Long term (current) use of oral hypoglycemic drugs: Secondary | ICD-10-CM

## 2018-06-19 DIAGNOSIS — S7292XD Unspecified fracture of left femur, subsequent encounter for closed fracture with routine healing: Secondary | ICD-10-CM

## 2018-06-19 DIAGNOSIS — Z8249 Family history of ischemic heart disease and other diseases of the circulatory system: Secondary | ICD-10-CM

## 2018-06-19 DIAGNOSIS — G4733 Obstructive sleep apnea (adult) (pediatric): Secondary | ICD-10-CM | POA: Diagnosis present

## 2018-06-19 DIAGNOSIS — G308 Other Alzheimer's disease: Secondary | ICD-10-CM | POA: Diagnosis not present

## 2018-06-19 DIAGNOSIS — Z9861 Coronary angioplasty status: Secondary | ICD-10-CM

## 2018-06-19 DIAGNOSIS — I5042 Chronic combined systolic (congestive) and diastolic (congestive) heart failure: Secondary | ICD-10-CM | POA: Diagnosis present

## 2018-06-19 DIAGNOSIS — L97409 Non-pressure chronic ulcer of unspecified heel and midfoot with unspecified severity: Secondary | ICD-10-CM

## 2018-06-19 DIAGNOSIS — L89620 Pressure ulcer of left heel, unstageable: Secondary | ICD-10-CM | POA: Diagnosis not present

## 2018-06-19 DIAGNOSIS — Z9989 Dependence on other enabling machines and devices: Secondary | ICD-10-CM

## 2018-06-19 DIAGNOSIS — L89623 Pressure ulcer of left heel, stage 3: Secondary | ICD-10-CM | POA: Diagnosis present

## 2018-06-19 DIAGNOSIS — E568 Deficiency of other vitamins: Secondary | ICD-10-CM | POA: Diagnosis not present

## 2018-06-19 DIAGNOSIS — S72142D Displaced intertrochanteric fracture of left femur, subsequent encounter for closed fracture with routine healing: Secondary | ICD-10-CM | POA: Diagnosis not present

## 2018-06-19 DIAGNOSIS — Z8673 Personal history of transient ischemic attack (TIA), and cerebral infarction without residual deficits: Secondary | ICD-10-CM | POA: Diagnosis not present

## 2018-06-19 DIAGNOSIS — G4089 Other seizures: Secondary | ICD-10-CM | POA: Diagnosis not present

## 2018-06-19 DIAGNOSIS — R29810 Facial weakness: Secondary | ICD-10-CM | POA: Diagnosis not present

## 2018-06-19 DIAGNOSIS — Z9089 Acquired absence of other organs: Secondary | ICD-10-CM

## 2018-06-19 DIAGNOSIS — Z8679 Personal history of other diseases of the circulatory system: Secondary | ICD-10-CM

## 2018-06-19 DIAGNOSIS — I2510 Atherosclerotic heart disease of native coronary artery without angina pectoris: Secondary | ICD-10-CM

## 2018-06-19 DIAGNOSIS — N183 Chronic kidney disease, stage 3 (moderate): Secondary | ICD-10-CM | POA: Diagnosis present

## 2018-06-19 DIAGNOSIS — Z955 Presence of coronary angioplasty implant and graft: Secondary | ICD-10-CM

## 2018-06-19 DIAGNOSIS — L8990 Pressure ulcer of unspecified site, unspecified stage: Secondary | ICD-10-CM

## 2018-06-19 DIAGNOSIS — G9389 Other specified disorders of brain: Principal | ICD-10-CM | POA: Diagnosis present

## 2018-06-19 DIAGNOSIS — Z66 Do not resuscitate: Secondary | ICD-10-CM | POA: Diagnosis present

## 2018-06-19 DIAGNOSIS — N189 Chronic kidney disease, unspecified: Secondary | ICD-10-CM | POA: Diagnosis present

## 2018-06-19 DIAGNOSIS — I959 Hypotension, unspecified: Secondary | ICD-10-CM | POA: Diagnosis not present

## 2018-06-19 DIAGNOSIS — D631 Anemia in chronic kidney disease: Secondary | ICD-10-CM | POA: Diagnosis present

## 2018-06-19 DIAGNOSIS — X58XXXD Exposure to other specified factors, subsequent encounter: Secondary | ICD-10-CM | POA: Diagnosis present

## 2018-06-19 DIAGNOSIS — Z9049 Acquired absence of other specified parts of digestive tract: Secondary | ICD-10-CM

## 2018-06-19 DIAGNOSIS — L97429 Non-pressure chronic ulcer of left heel and midfoot with unspecified severity: Secondary | ICD-10-CM | POA: Diagnosis not present

## 2018-06-19 DIAGNOSIS — Z7982 Long term (current) use of aspirin: Secondary | ICD-10-CM

## 2018-06-19 DIAGNOSIS — Z888 Allergy status to other drugs, medicaments and biological substances status: Secondary | ICD-10-CM

## 2018-06-19 DIAGNOSIS — E119 Type 2 diabetes mellitus without complications: Secondary | ICD-10-CM

## 2018-06-19 LAB — URINALYSIS, ROUTINE W REFLEX MICROSCOPIC
BILIRUBIN URINE: NEGATIVE
Glucose, UA: NEGATIVE mg/dL
Ketones, ur: NEGATIVE mg/dL
Leukocytes, UA: NEGATIVE
NITRITE: NEGATIVE
PH: 6 (ref 5.0–8.0)
Protein, ur: NEGATIVE mg/dL
SPECIFIC GRAVITY, URINE: 1.009 (ref 1.005–1.030)

## 2018-06-19 LAB — CBC WITH DIFFERENTIAL/PLATELET
ABS IMMATURE GRANULOCYTES: 0.03 10*3/uL (ref 0.00–0.07)
BASOS ABS: 0 10*3/uL (ref 0.0–0.1)
BASOS PCT: 0 %
Eosinophils Absolute: 0.2 10*3/uL (ref 0.0–0.5)
Eosinophils Relative: 3 %
HCT: 31.3 % — ABNORMAL LOW (ref 36.0–46.0)
Hemoglobin: 9.3 g/dL — ABNORMAL LOW (ref 12.0–15.0)
IMMATURE GRANULOCYTES: 0 %
LYMPHS PCT: 15 %
Lymphs Abs: 1.1 10*3/uL (ref 0.7–4.0)
MCH: 28.7 pg (ref 26.0–34.0)
MCHC: 29.7 g/dL — ABNORMAL LOW (ref 30.0–36.0)
MCV: 96.6 fL (ref 80.0–100.0)
MONO ABS: 0.6 10*3/uL (ref 0.1–1.0)
Monocytes Relative: 9 %
NEUTROS ABS: 5.4 10*3/uL (ref 1.7–7.7)
NEUTROS PCT: 73 %
NRBC: 0 % (ref 0.0–0.2)
PLATELETS: 317 10*3/uL (ref 150–400)
RBC: 3.24 MIL/uL — AB (ref 3.87–5.11)
RDW: 12.7 % (ref 11.5–15.5)
WBC: 7.4 10*3/uL (ref 4.0–10.5)

## 2018-06-19 LAB — COMPREHENSIVE METABOLIC PANEL
ALBUMIN: 2.8 g/dL — AB (ref 3.5–5.0)
ALK PHOS: 101 U/L (ref 38–126)
ALT: 9 U/L (ref 0–44)
AST: 14 U/L — AB (ref 15–41)
Anion gap: 8 (ref 5–15)
BUN: 21 mg/dL (ref 8–23)
CHLORIDE: 107 mmol/L (ref 98–111)
CO2: 26 mmol/L (ref 22–32)
Calcium: 9.3 mg/dL (ref 8.9–10.3)
Creatinine, Ser: 1.45 mg/dL — ABNORMAL HIGH (ref 0.44–1.00)
GFR calc Af Amer: 36 mL/min — ABNORMAL LOW (ref >60)
GFR calc non Af Amer: 31 mL/min — ABNORMAL LOW (ref >60)
GLUCOSE: 96 mg/dL (ref 70–99)
Potassium: 4.2 mmol/L (ref 3.5–5.1)
SODIUM: 141 mmol/L (ref 135–145)
Total Bilirubin: 0.5 mg/dL (ref 0.3–1.2)
Total Protein: 6.5 g/dL (ref 6.5–8.1)

## 2018-06-19 LAB — I-STAT TROPONIN, ED: Troponin i, poc: 0.02 ng/mL (ref 0.00–0.08)

## 2018-06-19 LAB — I-STAT CG4 LACTIC ACID, ED: Lactic Acid, Venous: 1 mmol/L (ref 0.5–1.9)

## 2018-06-19 LAB — AMMONIA: AMMONIA: 20 umol/L (ref 9–35)

## 2018-06-19 MED ORDER — FUROSEMIDE 20 MG PO TABS
20.0000 mg | ORAL_TABLET | ORAL | Status: DC
  Administered 2018-06-20 – 2018-06-24 (×3): 20 mg via ORAL
  Filled 2018-06-19 (×4): qty 1

## 2018-06-19 MED ORDER — LEVETIRACETAM 500 MG PO TABS
500.0000 mg | ORAL_TABLET | Freq: Two times a day (BID) | ORAL | Status: DC
  Administered 2018-06-20 – 2018-06-25 (×12): 500 mg via ORAL
  Filled 2018-06-19 (×12): qty 1

## 2018-06-19 MED ORDER — MIRTAZAPINE 15 MG PO TABS
15.0000 mg | ORAL_TABLET | Freq: Every day | ORAL | Status: DC
  Administered 2018-06-20 – 2018-06-24 (×6): 15 mg via ORAL
  Filled 2018-06-19 (×6): qty 1

## 2018-06-19 MED ORDER — LEVETIRACETAM 500 MG PO TABS
500.0000 mg | ORAL_TABLET | Freq: Once | ORAL | Status: AC
  Administered 2018-06-19: 500 mg via ORAL
  Filled 2018-06-19: qty 1

## 2018-06-19 MED ORDER — CARVEDILOL 3.125 MG PO TABS
3.1250 mg | ORAL_TABLET | Freq: Every day | ORAL | Status: DC
  Administered 2018-06-20 – 2018-06-25 (×6): 3.125 mg via ORAL
  Filled 2018-06-19 (×6): qty 1

## 2018-06-19 MED ORDER — DIGOXIN 125 MCG PO TABS
0.1250 mg | ORAL_TABLET | Freq: Every day | ORAL | Status: DC
  Administered 2018-06-20 – 2018-06-25 (×6): 0.125 mg via ORAL
  Filled 2018-06-19 (×6): qty 1

## 2018-06-19 MED ORDER — ACETAMINOPHEN 325 MG PO TABS
650.0000 mg | ORAL_TABLET | Freq: Two times a day (BID) | ORAL | Status: DC
  Administered 2018-06-20 – 2018-06-22 (×6): 650 mg via ORAL
  Filled 2018-06-19 (×6): qty 2

## 2018-06-19 MED ORDER — ASPIRIN EC 81 MG PO TBEC
81.0000 mg | DELAYED_RELEASE_TABLET | Freq: Two times a day (BID) | ORAL | Status: DC
  Administered 2018-06-20 – 2018-06-25 (×12): 81 mg via ORAL
  Filled 2018-06-19 (×12): qty 1

## 2018-06-19 MED ORDER — SACUBITRIL-VALSARTAN 24-26 MG PO TABS
1.0000 | ORAL_TABLET | Freq: Two times a day (BID) | ORAL | Status: DC
  Administered 2018-06-20 – 2018-06-25 (×12): 1 via ORAL
  Filled 2018-06-19 (×14): qty 1

## 2018-06-19 MED ORDER — ISOSORBIDE MONONITRATE 20 MG PO TABS
10.0000 mg | ORAL_TABLET | Freq: Two times a day (BID) | ORAL | Status: DC
  Administered 2018-06-20 (×2): 10 mg via ORAL
  Filled 2018-06-19 (×2): qty 1

## 2018-06-19 MED ORDER — ENOXAPARIN SODIUM 30 MG/0.3ML ~~LOC~~ SOLN
30.0000 mg | Freq: Every day | SUBCUTANEOUS | Status: DC
  Administered 2018-06-20 – 2018-06-23 (×4): 30 mg via SUBCUTANEOUS
  Filled 2018-06-19 (×4): qty 0.3

## 2018-06-19 NOTE — Procedures (Signed)
Date of recording10/25/2019  Referring physician Aroor  Reason for the study seizure  Technical Digital EEG recording using 10-20 international electrode system  Description of the recording Posterior dominant rhythm is6-8Hz  symmetrical reactive EEG comprises generalized delta and theta slowing with reactivity Epileptiform features were not seen during this recording  Impression The EEG isabnormal and findings are consistent with mild generalized cerebral dysfunction, epileptiform features were not seen during this recording

## 2018-06-19 NOTE — ED Triage Notes (Signed)
Pt brought in by EMS due to being altered. Pt had an episode of right sided facial droop and foaming at this mouth while at MD office. EMS unsure of how long episode lasted. Pt has hx of memory impairment. Pt recently had hip fx.

## 2018-06-19 NOTE — ED Provider Notes (Signed)
[facility] EMERGENCY DEPARTMENT Provider Note   CSN: [id number] Arrival date & time: 06/19/18  1020     History   Chief Complaint Chief Complaint  Patient presents with  . Altered Mental Status    HPI Mary Cook is a 84 y.o. female  Brought in by EMS for altered mental status. Is a level 5 caveat due to dementia.  History is gathered from the patient, I also called and spoke with the office staff at [facility] orthopedic, and review of the patient's EMR.  The patient has an extensive past medical history including history of aneurysm and cerebrovascular acti accident. According to the patient's sister and the staff at the orthopedic office patient was normal.  The states that she suddenly started staring off and would not respond and began foaming at the mouth.  I spoke with a physician assistant at the office.  Did not directly witness the event but says the nursing staff were concerned she might be having a TIA or stroke because she seemed to have some left-sided facial droop.  Patient sister states she thinks it lasted about 5 minutes.  She is unsure if she has any history of seizures.  The patient states that she "does not know that much about her sister."  She and her other sister are co-guardians of the patient.  The patient currently lives in a memory care facility.  HPI  Past Medical History:  Diagnosis Date  . CAD (coronary artery disease)    a. 04/2010 MI DES x 2 to LCX;  b. 05/2014 Cath: EF 35%, patent LCX stents w/ 70% stenosis in small AV groove LCX, Diag 50-60, RCA 50-60p->Med Rx.  . Cerebral aneurysm   . Chronic combined systolic (congestive) and diastolic (congestive) heart failure ([place])    a. 01/2015 Echo: EF 30-35%, sev inflat HK, Gr 1DD, mild AI.  Marland Kitchen Diverticulosis of colon   . Essential hypertension   . Frequent urination   . GIB (gastrointestinal bleeding)    a. 2002 Diverticular bleed.  . Hyperlipidemia   . Ischemic cardiomyopathy     a. 01/2015 Echo: EF 30-35%.  . Osteoarthritis   . Osteopenia   . Skin disorder   . Sleep apnea    cpap therapy  . Stroke ([place]) 2002  . T12 compression fracture ([place])    a. 04/2015.  Marland Kitchen Thrombus   . Type II diabetes mellitus ([place])   . Visual disorder     Patient Active Problem List   Diagnosis Date Noted  . Closed intertrochanteric fracture of left femur ([place]) 06/02/2018  . Hip fracture ([place]) 06/01/2018  . HCAP (healthcare-associated pneumonia) 05/22/2018  . Elevated troponin 05/22/2018  . Sinus bradycardia 04/01/2018  . Anemia 03/31/2018  . Traumatic subarachnoid hemorrhage with loss of consciousness of 30 minutes or less ([place])   . Anemia of chronic disease 09/06/2016  . Gastroenteritis 07/22/2016  . Hip pain, chronic 06/03/2016  . Dyspnea 11/15/2015  . Acute respiratory failure with hypoxia ([place]) 11/15/2015  . CHF exacerbation ([place]) 11/15/2015  . Pain in the chest   . Chronic systolic CHF (congestive heart failure) ([place])   . Ischemic cardiomyopathy   . T12 compression fracture ([place])   . Intertrigo 02/28/2015  . Actinic keratoses 02/28/2015  . Noncompliance with diet and medication regimen 02/28/2015  . LBP (low back pain) 02/13/2015  . Acute on chronic renal insufficiency 02/04/2015  . Cardiomyopathy, ischemic-EF 30-35% by echo 01/31/15 02/04/2015  . Chronic kidney disease stage  III 02/03/2015  . Acute on chronic combined systolic and diastolic CHF, NYHA class 3 ([place]) 01/30/2015  . Cystitis 10/28/2014  . Insomnia 10/28/2014  . Alzheimer's disease ([place]) 10/28/2014  . Falls frequently 10/12/2014  . Leg weakness, bilateral 10/12/2014  . OSA on CPAP 09/05/2014  . Rapid palpitations 06/07/2014  . Acute chest pain 06/06/2014  . Chest pain 06/06/2014  . Thoracic back pain after fall  04/13/2013  . Stress at home 01/26/2013  . GERD (gastroesophageal reflux disease) 01/25/2013  . SOB (shortness of breath) 08/28/2011  . Abnormal EKG, marked new ant TWI this adm with  negative Troponins,08/18/11 08/20/2011  . CAD S/P CFX PCI Sept 2011 08/18/2011  . Sleep apnea, cpap had been stopped secondary to "sinus issues" 08/18/2011  . [name] deficiency 03/20/2010  . Diabetes mellitus ([place]) 01/07/2008  . Hyperlipidemia 01/07/2008  . Essential hypertension 04/25/2007  . DIVERTICULOSIS, COLON 04/25/2007  . OSTEOARTHRITIS 04/25/2007  . Disorder of bone and cartilage 04/25/2007  . DIVERTICULITIS, HX OF 04/25/2007    Past Surgical History:  Procedure Laterality Date  . ABDOMINAL HYSTERECTOMY  1980   no bso  . APPENDECTOMY    . CARDIAC CATHETERIZATION  07/2011   LAD OK, D1 80%, CFX 30% w/ patent stent, RCA 50-60%, EF 40%  . CARDIAC CATHETERIZATION    . CORONARY ANGIOPLASTY WITH STENT PLACEMENT  05/11/2010   stent CFX  . DOPPLER ECHOCARDIOGRAPHY    . FEMUR IM NAIL Left 06/02/2018   Procedure: INTRAMEDULLARY (IM) NAIL FEMORAL;  Surgeon: Renette Butters, MD;  Location: [facility] ORS;  Service: Orthopedics;  Laterality: Left;  . LEFT HEART CATHETERIZATION WITH CORONARY ANGIOGRAM N/A 08/18/2011   Procedure: LEFT HEART CATHETERIZATION WITH CORONARY ANGIOGRAM;  Surgeon: Wellington Hampshire, MD;  Location: [facility] CATH LAB;  Service: Cardiovascular;  Laterality: N/A;  . LEFT HEART CATHETERIZATION WITH CORONARY ANGIOGRAM N/A 06/08/2014   Procedure: LEFT HEART CATHETERIZATION WITH CORONARY ANGIOGRAM;  Surgeon: Troy Sine, MD;  Location: [facility] CATH LAB;  Service: Cardiovascular;  Laterality: N/A;  . myocardial perfusion study    . THROMBECTOMY    . TONSILLECTOMY       OB History   None      Home Medications    Prior to Admission medications   Medication Sig Start Date End Date Taking? Authorizing Provider  acetaminophen (TYLENOL) 325 MG tablet Take 325 mg by mouth every 12 (twelve) hours as needed for mild pain or headache.    [provider]  albuterol (PROVENTIL HFA;VENTOLIN HFA) 108 (90 Base) MCG/ACT inhaler Inhale 2 puffs into the lungs every 6 (six) hours as needed  for wheezing or shortness of breath. 05/25/18   Kathie Dike, MD  aspirin 81 MG chewable tablet Chew 1 tablet (81 mg total) by mouth 2 (two) times daily. 06/04/18   Prudencio Burly III, PA-C  Blood Glucose Monitoring Suppl (ACCU-CHEK AVIVA PLUS) w/Device KIT Use to check blood sugars twice a day Dx. E11.9 09/30/16   Plotnikov, Evie Lacks, MD  carvedilol (COREG) 3.125 MG tablet Take 1 tablet (3.125 mg total) by mouth daily. 04/06/18   Barton Dubois, MD  digoxin (LANOXIN) 0.125 MG tablet Take 0.5 tablets (0.0625 mg total) by mouth daily. 05/11/18   Johnson, Clanford L, MD  docusate sodium (COLACE) 100 MG capsule Take 100 mg by mouth at bedtime.    [provider]  ferrous sulfate 325 (65 FE) MG tablet Take 1 tablet (325 mg total) by mouth daily with breakfast. 06/07/18   Shelly Coss,  MD  furosemide (LASIX) 20 MG tablet Take 1 tablet (20 mg total) by mouth every other day. 05/28/18   Almyra Deforest, PA  glipiZIDE (GLUCOTROL) 5 MG tablet Take 0.5 tablets (2.5 mg total) by mouth 2 (two) times daily before a meal. 04/06/18 04/06/19  Barton Dubois, MD  guaiFENesin (MUCINEX) 600 MG 12 hr tablet Take 1 tablet (600 mg total) by mouth 2 (two) times daily. 05/25/18 05/25/19  Kathie Dike, MD  guaiFENesin-dextromethorphan (ROBITUSSIN DM) 100-10 MG/5ML syrup Take 5 mLs by mouth 4 (four) times daily as needed for cough.    [provider]  HYDROcodone-acetaminophen (NORCO) 5-325 MG tablet Take 1-2 tablets by mouth every 6 (six) hours as needed for moderate pain. 06/02/18   Prudencio Burly III, PA-C  isosorbide mononitrate (IMDUR) 30 MG 24 hr tablet Take 0.5 tablets (15 mg total) by mouth daily. 04/06/18   Barton Dubois, MD  mirtazapine (REMERON) 15 MG tablet Take 15 mg by mouth at bedtime.  05/08/18   [provider]  pantoprazole (PROTONIX) 40 MG tablet Take 1 tablet (40 mg total) by mouth daily. 06/07/18   Shelly Coss, MD  polyethylene glycol (MIRALAX / GLYCOLAX) packet  Take 17 g by mouth daily as needed for mild constipation. 06/06/18   Shelly Coss, MD  potassium chloride SA (K-DUR,KLOR-CON) 20 MEQ tablet Take 1 tablet (20 mEq total) by mouth every other day. 05/28/18   Almyra Deforest, PA  sacubitril-valsartan (ENTRESTO) 49-51 MG Take 0.5 tablets by mouth 2 (two) times daily. 04/06/18   Barton Dubois, MD    Family History Family History  Problem Relation Age of Onset  . Hypertension Other   . CAD Unknown        father    Social History Social History   Tobacco Use  . Smoking status: Never Smoker  . Smokeless tobacco: Never Used  Substance Use Topics  . Alcohol use: No  . Drug use: No     Allergies   Clopidogrel bisulfate; Lipitor [atorvastatin]; Namenda [memantine hcl]; and Lisinopril   Review of Systems Review of Systems  Unable to perform ROS: Dementia     Physical Exam Updated Vital Signs There were no vitals taken for this visit.  Physical Exam  Constitutional: She appears well-developed and well-nourished. She appears lethargic. No distress.  HENT:  Head: Normocephalic and atraumatic.  Eyes: Pupils are equal, round, and reactive to light. Conjunctivae and EOM are normal. No scleral icterus.  Neck: Normal range of motion.  Cardiovascular: Normal rate, regular rhythm and normal heart sounds. Exam reveals no gallop and no friction rub.  No murmur heard. Pulmonary/Chest: Effort normal and breath sounds normal. No respiratory distress.  Abdominal: Soft. Bowel sounds are normal. She exhibits no distension and no mass. There is no tenderness. There is no guarding.  Neurological: She has normal strength. She appears lethargic. She is disoriented (Chronic and orientation is at baseline). She displays normal reflexes. No cranial nerve deficit. She exhibits normal muscle tone. GCS eye subscore is 4. GCS verbal subscore is 4. GCS motor subscore is 6.  Skin: Skin is warm and dry. She is not diaphoretic.  Psychiatric: Her behavior is normal.   Nursing note and vitals reviewed.    ED Treatments / Results  Labs (all labs ordered are listed, but only abnormal results are displayed) Labs Reviewed - No data to display  EKG EKG Interpretation  Date/Time:  Friday June 19 2018 10:32:48 EDT Ventricular Rate:  56 PR Interval:    QRS  Duration: 118 QT Interval:  433 QTC Calculation: 418 R Axis:   -37 Text Interpretation:  Sinus rhythm Borderline prolonged PR interval Incomplete left bundle branch block LVH with secondary repolarization abnormality Anterior Q waves, possibly due to LVH Since last tracing rate slower Confirmed by Noemi Chapel [phone]) on 06/19/2018 10:37:32 AM   Radiology No results found.  Procedures Procedures (including critical care time)  Medications Ordered in ED Medications - No data to display   Initial Impression / Assessment and Plan / ED Course  I have reviewed the triage vital signs and the nursing notes.  Pertinent labs & imaging results that were available during my care of the patient were reviewed by me and considered in my medical decision making (see chart for details).    84 year old female with history of Alzheimer's dementia who presents with episode of altered mental status.  Given the patient's history of CVA I have strong concern for potential seizure-like activity as she has likely encephalomalacia from her previous injury.  I discussed the case with Dr. Lorraine Lax who agrees with the assessment and asked that we begin her on Keppra.  Patient will be brought in for inpatient EEG.  Differential for her also includes TIA versus more likely syncopal event.  Patient will be admitted by the hospitalist service for cardiac monitoring. I have personally reviewed the patient's lab values.  She has mild renal insufficiency and hypoalbuminemia.  Urine appears clean without signs of infection CT head shows no acute intracranial abnormalities chest x-ray PA and lateral were films also reviewed by me  and show no abnormalities either.  EKG shows LVH with anterior Q waves but no acute changes.  Patient will be admitted by the hospitalist service.  She is stable throughout her visit here without repeat Tatian changes.  Final Clinical Impressions(s) / ED Diagnoses   Final diagnoses:  Altered mental status, unspecified altered mental status type    ED Discharge Orders    None       Margarita Mail, PA-C 06/19/18 1659    Noemi Chapel, MD 06/20/18 [phone]

## 2018-06-19 NOTE — Consult Note (Addendum)
NEURO HOSPITALIST CONSULT NOTE   Requestig physician: Dr. Cyndia Skeeters   Reason for Consult:AMS/ seizure like activity   History obtained from:  Chart and family  HPI:                                                                                                                                          Mary Cook is an 84 y.o. female  With PMH DM 2, stroke ( 2002), traumatic SAH (12/2016) HTN, cerebral aneurysm, CHF with Diastolic HF who presented to [facility] for AMS.  Per patient and chart review: Patient was at the orthopedic office and was normal. Then suddenly she started staring off and would not respond and began foaming at the mouth. Staff at ortho office were concerned d/t patient having some facial droop. This lasted about 5 minutes.  Patient normally lives in a memory care facility. no records of previous seizure history. Sister does not know if patient has a history of seizures.  Per husband patient fell about 2 month ago and fractured her right hip and left ankle. This is why she is in the nursing facility.   Ed course: GFR: 31, creatinine: 1.45, ammonia: WNL CT head: no hemorrhage. BP: 132/58 BG: 96  05/2001 right parietal infarct  With no residual deficits.   Past Medical History:  Diagnosis Date  . CAD (coronary artery disease)    a. 04/2010 MI DES x 2 to LCX;  b. 05/2014 Cath: EF 35%, patent LCX stents w/ 70% stenosis in small AV groove LCX, Diag 50-60, RCA 50-60p->Med Rx.  . Cerebral aneurysm   . Chronic combined systolic (congestive) and diastolic (congestive) heart failure ([place])    a. 01/2015 Echo: EF 30-35%, sev inflat HK, Gr 1DD, mild AI.  Marland Kitchen Diverticulosis of colon   . Essential hypertension   . Frequent urination   . GIB (gastrointestinal bleeding)    a. 2002 Diverticular bleed.  . Hyperlipidemia   . Ischemic cardiomyopathy    a. 01/2015 Echo: EF 30-35%.  . Osteoarthritis   . Osteopenia   . Skin disorder   . Sleep apnea    cpap therapy   . Stroke ([place]) 2002  . T12 compression fracture ([place])    a. 04/2015.  Marland Kitchen Thrombus   . Type II diabetes mellitus ([place])   . Visual disorder     Past Surgical History:  Procedure Laterality Date  . ABDOMINAL HYSTERECTOMY  1980   no bso  . APPENDECTOMY    . CARDIAC CATHETERIZATION  07/2011   LAD OK, D1 80%, CFX 30% w/ patent stent, RCA 50-60%, EF 40%  . CARDIAC CATHETERIZATION    . CORONARY ANGIOPLASTY WITH STENT PLACEMENT  05/11/2010   stent CFX  . DOPPLER ECHOCARDIOGRAPHY    . FEMUR IM  NAIL Left 06/02/2018   Procedure: INTRAMEDULLARY (IM) NAIL FEMORAL;  Surgeon: Renette Butters, MD;  Location: [facility];  Service: Orthopedics;  Laterality: Left;  . LEFT HEART CATHETERIZATION WITH CORONARY ANGIOGRAM N/A 08/18/2011   Procedure: LEFT HEART CATHETERIZATION WITH CORONARY ANGIOGRAM;  Surgeon: Wellington Hampshire, MD;  Location: [facility] CATH LAB;  Service: Cardiovascular;  Laterality: N/A;  . LEFT HEART CATHETERIZATION WITH CORONARY ANGIOGRAM N/A 06/08/2014   Procedure: LEFT HEART CATHETERIZATION WITH CORONARY ANGIOGRAM;  Surgeon: Troy Sine, MD;  Location: [facility] CATH LAB;  Service: Cardiovascular;  Laterality: N/A;  . myocardial perfusion study    . THROMBECTOMY    . TONSILLECTOMY      Family History  Problem Relation Age of Onset  . Hypertension Other   . CAD Unknown        father         Social History:  reports that she has never smoked. She has never used smokeless tobacco. She reports that she does not drink alcohol or use drugs.  Allergies  Allergen Reactions  . Clopidogrel Bisulfate Nausea And Vomiting    Patient is not aware of this allergy  . Lipitor [Atorvastatin] Other (See Comments)    Weak legs  . Namenda [Memantine Hcl]     N/v/d  . Lisinopril Cough    Patient is not aware of this allergy    MEDICATIONS:                                                                                                                     No current facility-administered medications for  this encounter.    Current Outpatient Medications  Medication Sig Dispense Refill  . acetaminophen (TYLENOL) 325 MG tablet Take 650 mg by mouth 2 (two) times daily.     Marland Kitchen albuterol (PROVENTIL HFA;VENTOLIN HFA) 108 (90 Base) MCG/ACT inhaler Inhale 2 puffs into the lungs every 6 (six) hours as needed for wheezing or shortness of breath. 1 Inhaler 2  . aspirin EC 81 MG tablet Take 81 mg by mouth 2 (two) times daily.    . carvedilol (COREG) 3.125 MG tablet Take 1 tablet (3.125 mg total) by mouth daily. 30 tablet 1  . digoxin (LANOXIN) 0.125 MG tablet Take 0.5 tablets (0.0625 mg total) by mouth daily. (Patient taking differently: Take 0.125 mg by mouth daily. )    . docusate sodium (COLACE) 100 MG capsule Take 100 mg by mouth at bedtime.    . ergocalciferol (VITAMIN D2) 50000 units capsule Take 50,000 Units by mouth every Wednesday.    . famotidine (PEPCID) 10 MG tablet Take 20 mg by mouth daily.    . ferrous sulfate 325 (65 FE) MG tablet Take 1 tablet (325 mg total) by mouth daily with breakfast. 30 tablet 0  . furosemide (LASIX) 20 MG tablet Take 1 tablet (20 mg total) by mouth every other day. 5 tablet 0  . glipiZIDE (GLUCOTROL) 5 MG tablet Take 0.5 tablets (2.5 mg total) by mouth 2 (two)  times daily before a meal. 30 tablet 1  . guaiFENesin (MUCINEX) 600 MG 12 hr tablet Take 1 tablet (600 mg total) by mouth 2 (two) times daily. 60 tablet 2  . guaiFENesin-dextromethorphan (ROBITUSSIN DM) 100-10 MG/5ML syrup Take 5 mLs by mouth 4 (four) times daily as needed for cough.    Marland Kitchen HYDROcodone-acetaminophen (NORCO) 5-325 MG tablet Take 1-2 tablets by mouth every 6 (six) hours as needed for moderate pain. 30 tablet 0  . isosorbide mononitrate (IMDUR) 30 MG 24 hr tablet Take 0.5 tablets (15 mg total) by mouth daily. (Patient taking differently: Take 10 mg by mouth 2 (two) times daily. ) 30 tablet 1  . mirtazapine (REMERON) 15 MG tablet Take 15 mg by mouth at bedtime.     . polyethylene glycol (MIRALAX /  GLYCOLAX) packet Take 17 g by mouth daily as needed for mild constipation. 14 each 0  . potassium chloride SA (K-DUR,KLOR-CON) 20 MEQ tablet Take 1 tablet (20 mEq total) by mouth every other day. 5 tablet 0  . sacubitril-valsartan (ENTRESTO) 49-51 MG Take 0.5 tablets by mouth 2 (two) times daily. 60 tablet 0  . aspirin 81 MG chewable tablet Chew 1 tablet (81 mg total) by mouth 2 (two) times daily. (Patient not taking: Reported on 06/19/2018) 60 tablet 0  . Blood Glucose Monitoring Suppl (ACCU-CHEK AVIVA PLUS) w/Device KIT Use to check blood sugars twice a day Dx. E11.9 1 kit 0  . pantoprazole (PROTONIX) 40 MG tablet Take 1 tablet (40 mg total) by mouth daily. (Patient not taking: Reported on 06/19/2018) 30 tablet 0      ROS:                                                                                                                                        unobtainable from patient due to mental status  Blood pressure (!) 132/58, pulse 70, temperature (!) 97.4 F (36.3 C), temperature source Oral, resp. rate (!) 9, height _0  (1.651 m), weight 57.8 kg, SpO2 99 %.   General Examination:                                                                                                       Physical Exam  HEENT-  Normocephalic, no lesions, without obvious abnormality.  Normal external eye and conjunctiva.   Cardiovascular- S1-S2 audible, pulses palpable throughout   Lungs-no rhonchi or wheezing noted, no excessive working breathing.  Saturations within normal limits on RA  Abdomen- All 4 quadrants palpated and nontender Extremities- Warm, dry and intact Musculoskeletal-no joint tenderness, deformity or swelling Skin-warm and dry, no hyperpigmentation, vitiligo, or suspicious lesions  Neurological Examination Mental Status: Drowsy, easily aroused to light touch and name calling, but keeps falling back asleep. Oriented to name. Did not know age/month/year. Speech fluent without evidence of  aphasia.  Able to follow some simple commands without difficulty. Cranial Nerves: II:  Visual fields grossly normal, ( difficult to keep patient awake long enough) III,IV, VI: ptosis not present, extra-ocular motions intact bilaterally pupils equal, round, reactive to light  V,VII: smile symmetric, facial light touch sensation normal bilaterally VIII: hearing normal bilaterally IX,X: uvula rises symmetrically XI: bilateral shoulder shrug XII: midline tongue extension Motor: BUE: 5/5 BLE: able to lift both anti-gravity Tone and bulk:normal tone throughout; no atrophy noted Sensory:  light touch intact throughout, bilaterally Deep Tendon Reflexes: 2+ and symmetric biceps and patella Plantars: Right: downgoing   Left: downgoing Cerebellar: UTA, due to patient falling asleep and lack of attention Gait: deferred   Lab Results: Basic Metabolic Panel: Recent Labs  Lab 06/19/18 1038  NA 141  K 4.2  CL 107  CO2 26  GLUCOSE 96  BUN 21  CREATININE 1.45*  CALCIUM 9.3    CBC: Recent Labs  Lab 06/19/18 1038  WBC 7.4  NEUTROABS 5.4  HGB 9.3*  HCT 31.3*  MCV 96.6  PLT 317    Imaging: Dg Chest 2 View  Result Date: 06/19/2018 CLINICAL DATA:  Altered mental status. EXAM: CHEST - 2 VIEW COMPARISON:  Single-view of the chest 06/01/2018. PA and lateral chest 01/22/2017. CT chest 10/29/2015. FINDINGS: The lungs are clear. There is cardiomegaly. Aortic atherosclerosis is noted. No pneumothorax or pleural fluid. No acute or focal bony abnormality. IMPRESSION: No acute disease. Cardiomegaly. Atherosclerosis. Electronically Signed   By: Inge Rise M.D.   On: 06/19/2018 11:23   Ct Head Wo Contrast  Result Date: 06/19/2018 CLINICAL DATA:  Altered mental status. EXAM: CT HEAD WITHOUT CONTRAST TECHNIQUE: Contiguous axial images were obtained from the base of the skull through the vertex without intravenous contrast. COMPARISON:  06/01/2018 and 03/31/2018 FINDINGS: Brain: Ventricles  and cisterns are within normal. There is mild age related atrophic change. There is an old right MCA/watershed territory infarct. There is chronic ischemic microvascular disease. Small old right basal ganglia infarct. No mass, mass effect, shift of midline structures or acute hemorrhage. No acute infarction. Vascular: No hyperdense vessel or unexpected calcification. Skull: Normal. Negative for fracture or focal lesion. Sinuses/Orbits: Orbits are normal. Paranasal sinuses are well developed with mild opacification over the left sphenoid sinus and floor the left maxillary sinus with significant interval improvement. Mastoid air cells are notable for minimal opacification over the inferior aspect right worse than left without significant change. Other: None. IMPRESSION: No acute findings. Chronic ischemic microvascular disease with old right-sided infarcts as described. Mild age related atrophy. Mild sinus inflammatory change with significant interval improvement. Electronically Signed   By: Marin Olp M.D.   On: 06/19/2018 11:39    Assessment:  With PMH DM 2, dementia, stroke ( 2002), traumatic SAH (12/2016) HTN, cerebral aneurysm, CHF with Diastolic HF who presented to [facility] for AMS. Routine EEG to evaluate for seizure. 1 dose of keppra 500 mg given in ED.      Recommendations: -routine EEG  -TSH, [name], ammonia, UDS, UA -Keppra 500 mg BID  Laurey Morale, MSN, NP-C [facility] [phone]  Attending neurologist's note to follow  06/19/2018, 3:57 PM   NEUROHOSPITALIST ADDENDUM Performed a face to face diagnostic evaluation.   I have reviewed the contents of history and physical exam as documented by PA/ARNP/Resident and agree with above documentation.  I have discussed and formulated the above plan as documented. Edits to the note have been made as needed.   Patient resting comfortably, states her name and follows commands.  Likely had a seizure based on description of  the event.  Patient is high risk for seizures and will start her on Keppra 500 twice daily.  Patient not back to baseline tomorrow and can obtain MRI brain to rule out stroke.  Karena Addison Derrico Zhong MD [facility] [phone]   If 7pm to 7am, please call on call as listed on AMION.

## 2018-06-19 NOTE — Progress Notes (Signed)
EEG completed, results pending. 

## 2018-06-19 NOTE — H&P (Signed)
History and Physical    Mary Cook IDP:[id number] DOB: 04-Sep-1925 DOA: 06/19/2018  PCP: Rosita Fire, MD Patient [facility]: [facility]  Chief Complaint: Seizure-like activity  HPI: SARENA Cook is a 84 y.o. female with medical history significant for dementia, CVA, CAD status post stent placement in 2011 in 4431, combined systolic and diastolic CHF, OSA on CPAP, recent left hip fracture status post ORIF 2 weeks ago who presented with seizure-like activity.  Per patient's sister, patient was at ortho office for follow-up on her recent hip fracture repair this morning when she suddenly started staring out with foaming from her mouth for about 1 minute.  She was not responsive.  They also noted left facial droop.  They called EMS and brought her to ED out of concern for seizure.  Per patient's sister, patient was somewhat sleepy when she was taken to orthopedic office this morning.  Both husband and patient sister taking patient is at baseline now.  Patient denies headache, fever, URI symptoms, acute vision change, chest pain, trouble breathing, nausea, vomiting, abdominal pain or dysuria.  She does state that her left heel has been hurting.   In ED, vital signs are stable.  CMP and CBC at baseline.  Troponin negative.  Lactic acid negative.  Chest x-ray and CT head without acute finding.  UA with small hemoglobin and rare bacteria.  Troponin negative.  UA EKG with very mild ST elevation in anterior leads which appears to be chronic.  She also have incomplete left bundle branch block which is chronic.  Neurology was consulted and recommended starting patient on Keppra 500 mg twice daily and getting EEG.  Hospitalist service was called to admit patient for further evaluation and management.  ROS Review of Systems  Constitutional: Negative for fever and weight loss.  HENT: Negative for sore throat.   Eyes: Negative for blurred vision, photophobia and pain.  Respiratory:  Negative for cough and shortness of breath.   Cardiovascular: Negative for chest pain, palpitations and leg swelling.  Gastrointestinal: Negative for abdominal pain, blood in stool, diarrhea, melena, nausea and vomiting.  Genitourinary: Negative for dysuria.  Musculoskeletal: Negative for myalgias.       Left heel pain  Skin: Negative for rash.  Neurological: Positive for seizures. Negative for speech change, focal weakness, weakness and headaches.       Steering and foaming  Endo/Heme/Allergies: Does not bruise/bleed easily.  Psychiatric/Behavioral: Negative for substance abuse. The patient is not nervous/anxious.     PMH Past Medical History:  Diagnosis Date  . CAD (coronary artery disease)    a. 04/2010 MI DES x 2 to LCX;  b. 05/2014 Cath: EF 35%, patent LCX stents w/ 70% stenosis in small AV groove LCX, Diag 50-60, RCA 50-60p->Med Rx.  . Cerebral aneurysm   . Chronic combined systolic (congestive) and diastolic (congestive) heart failure ([place])    a. 01/2015 Echo: EF 30-35%, sev inflat HK, Gr 1DD, mild AI.  Marland Kitchen Diverticulosis of colon   . Essential hypertension   . Frequent urination   . GIB (gastrointestinal bleeding)    a. 2002 Diverticular bleed.  . Hyperlipidemia   . Ischemic cardiomyopathy    a. 01/2015 Echo: EF 30-35%.  . Osteoarthritis   . Osteopenia   . Skin disorder   . Sleep apnea    cpap therapy  . Stroke ([place]) 2002  . T12 compression fracture ([place])    a. 04/2015.  Marland Kitchen Thrombus   . Type II diabetes mellitus (  [place])   . Visual disorder    PSH Past Surgical History:  Procedure Laterality Date  . ABDOMINAL HYSTERECTOMY  1980   no bso  . APPENDECTOMY    . CARDIAC CATHETERIZATION  07/2011   LAD OK, D1 80%, CFX 30% w/ patent stent, RCA 50-60%, EF 40%  . CARDIAC CATHETERIZATION    . CORONARY ANGIOPLASTY WITH STENT PLACEMENT  05/11/2010   stent CFX  . DOPPLER ECHOCARDIOGRAPHY    . FEMUR IM NAIL Left 06/02/2018   Procedure: INTRAMEDULLARY (IM) NAIL FEMORAL;  Surgeon:  Renette Butters, MD;  Location: [facility];  Service: Orthopedics;  Laterality: Left;  . LEFT HEART CATHETERIZATION WITH CORONARY ANGIOGRAM N/A 08/18/2011   Procedure: LEFT HEART CATHETERIZATION WITH CORONARY ANGIOGRAM;  Surgeon: Wellington Hampshire, MD;  Location: [facility] CATH LAB;  Service: Cardiovascular;  Laterality: N/A;  . LEFT HEART CATHETERIZATION WITH CORONARY ANGIOGRAM N/A 06/08/2014   Procedure: LEFT HEART CATHETERIZATION WITH CORONARY ANGIOGRAM;  Surgeon: Troy Sine, MD;  Location: [facility] CATH LAB;  Service: Cardiovascular;  Laterality: N/A;  . myocardial perfusion study    . THROMBECTOMY    . TONSILLECTOMY     Fam HX Family History  Problem Relation Age of Onset  . Hypertension Other   . CAD Unknown        father    Social Hx  reports that she has never smoked. She has never used smokeless tobacco. She reports that she does not drink alcohol or use drugs.  Allergy Allergies  Allergen Reactions  . Clopidogrel Bisulfate Nausea And Vomiting    Patient is not aware of this allergy  . Lipitor [Atorvastatin] Other (See Comments)    Weak legs  . Namenda [Memantine Hcl]     N/v/d  . Lisinopril Cough    Patient is not aware of this allergy   Home Meds Prior to Admission medications   Medication Sig Start Date End Date Taking? Authorizing Provider  acetaminophen (TYLENOL) 325 MG tablet Take 650 mg by mouth 2 (two) times daily.    Yes [provider]  albuterol (PROVENTIL HFA;VENTOLIN HFA) 108 (90 Base) MCG/ACT inhaler Inhale 2 puffs into the lungs every 6 (six) hours as needed for wheezing or shortness of breath. 05/25/18  Yes Kathie Dike, MD  aspirin EC 81 MG tablet Take 81 mg by mouth 2 (two) times daily.   Yes [provider]  carvedilol (COREG) 3.125 MG tablet Take 1 tablet (3.125 mg total) by mouth daily. 04/06/18  Yes Barton Dubois, MD  digoxin (LANOXIN) 0.125 MG tablet Take 0.5 tablets (0.0625 mg total) by mouth daily. Patient taking differently: Take  0.125 mg by mouth daily.  05/11/18  Yes Johnson, Clanford L, MD  docusate sodium (COLACE) 100 MG capsule Take 100 mg by mouth at bedtime.   Yes [provider]  ergocalciferol (VITAMIN D2) 50000 units capsule Take 50,000 Units by mouth every Wednesday.   Yes [provider]  famotidine (PEPCID) 10 MG tablet Take 20 mg by mouth daily.   Yes [provider]  ferrous sulfate 325 (65 FE) MG tablet Take 1 tablet (325 mg total) by mouth daily with breakfast. 06/07/18  Yes Adhikari, Amrit, MD  furosemide (LASIX) 20 MG tablet Take 1 tablet (20 mg total) by mouth every other day. 05/28/18  Yes Almyra Deforest, PA  glipiZIDE (GLUCOTROL) 5 MG tablet Take 0.5 tablets (2.5 mg total) by mouth 2 (two) times daily before a meal. 04/06/18 04/06/19 Yes Barton Dubois, MD  guaiFENesin (  MUCINEX) 600 MG 12 hr tablet Take 1 tablet (600 mg total) by mouth 2 (two) times daily. 05/25/18 05/25/19 Yes Memon, Jolaine Artist, MD  guaiFENesin-dextromethorphan (ROBITUSSIN DM) 100-10 MG/5ML syrup Take 5 mLs by mouth 4 (four) times daily as needed for cough.   Yes [provider]  HYDROcodone-acetaminophen (NORCO) 5-325 MG tablet Take 1-2 tablets by mouth every 6 (six) hours as needed for moderate pain. 06/02/18  Yes Prudencio Burly III, PA-C  isosorbide mononitrate (IMDUR) 30 MG 24 hr tablet Take 0.5 tablets (15 mg total) by mouth daily. Patient taking differently: Take 10 mg by mouth 2 (two) times daily.  04/06/18  Yes Barton Dubois, MD  mirtazapine (REMERON) 15 MG tablet Take 15 mg by mouth at bedtime.  05/08/18  Yes [provider]  polyethylene glycol (MIRALAX / GLYCOLAX) packet Take 17 g by mouth daily as needed for mild constipation. 06/06/18  Yes Shelly Coss, MD  potassium chloride SA (K-DUR,KLOR-CON) 20 MEQ tablet Take 1 tablet (20 mEq total) by mouth every other day. 05/28/18  Yes Meng, Isaac Laud, PA  sacubitril-valsartan (ENTRESTO) 49-51 MG Take 0.5 tablets by mouth 2 (two) times daily.  04/06/18  Yes Barton Dubois, MD  aspirin 81 MG chewable tablet Chew 1 tablet (81 mg total) by mouth 2 (two) times daily. Patient not taking: Reported on 06/19/2018 06/04/18   Prudencio Burly III, PA-C  Blood Glucose Monitoring Suppl (ACCU-CHEK AVIVA PLUS) w/Device KIT Use to check blood sugars twice a day Dx. E11.9 09/30/16   Plotnikov, Evie Lacks, MD  pantoprazole (PROTONIX) 40 MG tablet Take 1 tablet (40 mg total) by mouth daily. Patient not taking: Reported on 06/19/2018 06/07/18   Shelly Coss, MD    Physical Exam: Vitals:   06/19/18 1400 06/19/18 1500 06/19/18 1530 06/19/18 1545  BP: 138/70 (!) 124/58 131/61 (!) 132/58  Pulse: 66 70 69 70  Resp: '13 16 15 '$ (!) 9  Temp:      TempSrc:      SpO2: 99% 100% 97% 99%  Weight:      Height:        Constitutional: NAD, calm, comfortable.  Eyes: lids and conjunctivae normal HENMT: atraumatic. Normocephalic. Hearing grossly intact. No rhinorrhea. MMM.  No obvious tongue bite or gum bleeding Neck: normal. Supple. No mass. No thyromegaly Resp: normal effort. Good air movement bilaterally.  Clear to auscultation bilaterally CVS: RRR. S1 & S2 heard. No murmurs. No edema.  GI: normal bowel sound. No tenderness. No distention. MSK: No obvious deformity. Moving extremities. Left hip surgical wound healed and clean. Stage 2-3 left heel wound. Skin: no apparent lesion. Normal warmth. Stage 2-3 left heel wound. Neuro: Awake. Alert. Oriented only to self and person. CN 2-12 grossly intact. Light sensation intact. Normal strength. DTR symmetric.  Psych: Calm.  Labs on Admission: I have personally reviewed following labs and imaging studies  CBC: Recent Labs  Lab 06/19/18 1038  WBC 7.4  NEUTROABS 5.4  HGB 9.3*  HCT 31.3*  MCV 96.6  PLT 308   Basic Metabolic Panel: Recent Labs  Lab 06/19/18 1038  NA 141  K 4.2  CL 107  CO2 26  GLUCOSE 96  BUN 21  CREATININE 1.45*  CALCIUM 9.3   GFR: Estimated Creatinine Clearance: 23.2  mL/min (A) (by C-G formula based on SCr of 1.45 mg/dL (H)). Liver Function Tests: Recent Labs  Lab 06/19/18 1038  AST 14*  ALT 9  ALKPHOS 101  BILITOT 0.5  PROT 6.5  ALBUMIN 2.8*  No results for input(s): LIPASE, AMYLASE in the last 168 hours. Recent Labs  Lab 06/19/18 1038  AMMONIA 20   Coagulation Profile: No results for input(s): INR, PROTIME in the last 168 hours. Cardiac Enzymes: No results for input(s): CKTOTAL, CKMB, CKMBINDEX, TROPONINI in the last 168 hours. BNP (last 3 results) No results for input(s): PROBNP in the last 8760 hours. HbA1C: No results for input(s): HGBA1C in the last 72 hours. CBG: No results for input(s): GLUCAP in the last 168 hours. Lipid Profile: No results for input(s): CHOL, HDL, LDLCALC, TRIG, CHOLHDL, LDLDIRECT in the last 72 hours. Thyroid Function Tests: No results for input(s): TSH, T4TOTAL, FREET4, T3FREE, THYROIDAB in the last 72 hours. Anemia Panel: No results for input(s): VITAMINB12, FOLATE, FERRITIN, TIBC, IRON, RETICCTPCT in the last 72 hours. Urine analysis:    Component Value Date/Time   COLORURINE STRAW (A) 06/19/2018 1325   APPEARANCEUR CLEAR 06/19/2018 1325   LABSPEC 1.009 06/19/2018 1325   PHURINE 6.0 06/19/2018 1325   GLUCOSEU NEGATIVE 06/19/2018 1325   GLUCOSEU NEGATIVE 10/12/2014 1053   HGBUR SMALL (A) 06/19/2018 1325   BILIRUBINUR NEGATIVE 06/19/2018 1325   KETONESUR NEGATIVE 06/19/2018 1325   PROTEINUR NEGATIVE 06/19/2018 1325   UROBILINOGEN 1.0 04/28/2015 1658   NITRITE NEGATIVE 06/19/2018 1325   LEUKOCYTESUR NEGATIVE 06/19/2018 1325    Sepsis Labs: No leukocytosis.  Lactic acid negative. )No results found for this or any previous visit (from the past 240 hour(s)).   Radiological Exams on Admission: Dg Chest 2 View  Result Date: 06/19/2018 CLINICAL DATA:  Altered mental status. EXAM: CHEST - 2 VIEW COMPARISON:  Single-view of the chest 06/01/2018. PA and lateral chest 01/22/2017. CT chest 10/29/2015.  FINDINGS: The lungs are clear. There is cardiomegaly. Aortic atherosclerosis is noted. No pneumothorax or pleural fluid. No acute or focal bony abnormality. IMPRESSION: No acute disease. Cardiomegaly. Atherosclerosis. Electronically Signed   By: Inge Rise M.D.   On: 06/19/2018 11:23   Ct Head Wo Contrast  Result Date: 06/19/2018 CLINICAL DATA:  Altered mental status. EXAM: CT HEAD WITHOUT CONTRAST TECHNIQUE: Contiguous axial images were obtained from the base of the skull through the vertex without intravenous contrast. COMPARISON:  06/01/2018 and 03/31/2018 FINDINGS: Brain: Ventricles and cisterns are within normal. There is mild age related atrophic change. There is an old right MCA/watershed territory infarct. There is chronic ischemic microvascular disease. Small old right basal ganglia infarct. No mass, mass effect, shift of midline structures or acute hemorrhage. No acute infarction. Vascular: No hyperdense vessel or unexpected calcification. Skull: Normal. Negative for fracture or focal lesion. Sinuses/Orbits: Orbits are normal. Paranasal sinuses are well developed with mild opacification over the left sphenoid sinus and floor the left maxillary sinus with significant interval improvement. Mastoid air cells are notable for minimal opacification over the inferior aspect right worse than left without significant change. Other: None. IMPRESSION: No acute findings. Chronic ischemic microvascular disease with old right-sided infarcts as described. Mild age related atrophy. Mild sinus inflammatory change with significant interval improvement. Electronically Signed   By: Marin Olp M.D.   On: 06/19/2018 11:39    All images have been reviewed by me personally.   EKG: Mild ST elevations in septal leads and T wave changes, relatively unchanged from old EKG.  Assessment/Plan Principal Problem:   Seizure-like activity (HCC) Active Problems:   Diabetes mellitus ([place])   Essential hypertension    CAD S/P CFX PCI Sept 2011   OSA on CPAP   Chronic combined systolic and diastolic  CHF (congestive heart failure) (HCC)   Chronic kidney disease stage III   Anemia of chronic disease   Seizure-like activity: episode of staring and foaming at [place] office prior to arrival.  Does not look postictal on my exam.  No focal neuro deficit.  No prior history of seizure.  Started on Keppra 500 mg twice daily by neurology -Appreciate neurology recommendations-continue Keppra 500 mg twice daily and EEG-EEG ordered -Seizure aspiration precautions -SLP/PT/OT  CAD status post stent: Stable.  No anginal symptoms.  EKG with old mild ST elevation in anterior leads and T wave changes.  Initial troponin negative -Continue home meds.  Chronic combined systolic diastolic CHF: Cardiopulmonary symptoms.  No signs of fluid overload.  Echo in 04/2018 with EF of 30 to 35%, diffuse hypokinesis, PAPP 64mHg.  -Continue home meds. -Check digoxin level  History of diabetes:  -Check A1c -Hold home glipizide  CKD 3: Stable -Check BMP in the morning  Anemia of chronic disease-stable -Monitor as needed  OSA on CPAP -Nightly CPAP  History of left hip fracture status post ORIF 2 weeks ago.  Wound healed.  No signs of infection.  Left heel wound -wound care consult  DVT prophylaxis: Lovenox Code Status: DNR Family Communication: Husband and sister at bedside Disposition Plan: Elementary Consults called: Wound care Admission status: Observation status   TMercy RidingMD [facility] Pager [phone] If 7PM-7AM, please contact night-coverage [email or web address] Password [email or web address] 06/19/2018, 5:51 PM

## 2018-06-19 NOTE — Progress Notes (Signed)
Received a call from Netta Cedars a case manager with [place], and she is notifying us that she is a ward of the state and we need to contact her for consent that may be needed during the pateints hospitalization.  Patient is a resident of [facility] in [place], [place] and that facility number is [phone] to obtain medical information.  Netta Cedars [facility] DDS CM) Business hours [phone] After hours [phone]

## 2018-06-20 DIAGNOSIS — I10 Essential (primary) hypertension: Secondary | ICD-10-CM

## 2018-06-20 LAB — CBC
HCT: 26.7 % — ABNORMAL LOW (ref 36.0–46.0)
HEMATOCRIT: 24.7 % — AB (ref 36.0–46.0)
HEMOGLOBIN: 7.6 g/dL — AB (ref 12.0–15.0)
Hemoglobin: 8.2 g/dL — ABNORMAL LOW (ref 12.0–15.0)
MCH: 28.8 pg (ref 26.0–34.0)
MCH: 29 pg (ref 26.0–34.0)
MCHC: 30.7 g/dL (ref 30.0–36.0)
MCHC: 30.8 g/dL (ref 30.0–36.0)
MCV: 93.7 fL (ref 80.0–100.0)
MCV: 94.3 fL (ref 80.0–100.0)
Platelets: 267 10*3/uL (ref 150–400)
Platelets: 300 K/uL (ref 150–400)
RBC: 2.62 MIL/uL — ABNORMAL LOW (ref 3.87–5.11)
RBC: 2.85 MIL/uL — ABNORMAL LOW (ref 3.87–5.11)
RDW: 12.7 % (ref 11.5–15.5)
RDW: 12.8 % (ref 11.5–15.5)
WBC: 5.3 10*3/uL (ref 4.0–10.5)
WBC: 6 K/uL (ref 4.0–10.5)
nRBC: 0 % (ref 0.0–0.2)
nRBC: 0 % (ref 0.0–0.2)

## 2018-06-20 LAB — CREATININE, SERUM
Creatinine, Ser: 1.47 mg/dL — ABNORMAL HIGH (ref 0.44–1.00)
GFR calc Af Amer: 35 mL/min — ABNORMAL LOW (ref >60)
GFR calc non Af Amer: 30 mL/min — ABNORMAL LOW (ref >60)

## 2018-06-20 LAB — COMPREHENSIVE METABOLIC PANEL
ALBUMIN: 2.4 g/dL — AB (ref 3.5–5.0)
ALT: 8 U/L (ref 0–44)
AST: 13 U/L — ABNORMAL LOW (ref 15–41)
Alkaline Phosphatase: 83 U/L (ref 38–126)
Anion gap: 8 (ref 5–15)
BUN: 20 mg/dL (ref 8–23)
CHLORIDE: 109 mmol/L (ref 98–111)
CO2: 24 mmol/L (ref 22–32)
Calcium: 8.9 mg/dL (ref 8.9–10.3)
Creatinine, Ser: 1.43 mg/dL — ABNORMAL HIGH (ref 0.44–1.00)
GFR calc Af Amer: 36 mL/min — ABNORMAL LOW (ref >60)
GFR, EST NON AFRICAN AMERICAN: 31 mL/min — AB (ref >60)
Glucose, Bld: 89 mg/dL (ref 70–99)
POTASSIUM: 3.5 mmol/L (ref 3.5–5.1)
SODIUM: 141 mmol/L (ref 135–145)
Total Bilirubin: 0.5 mg/dL (ref 0.3–1.2)
Total Protein: 5.5 g/dL — ABNORMAL LOW (ref 6.5–8.1)

## 2018-06-20 LAB — TSH: TSH: 4.26 u[IU]/mL (ref 0.350–4.500)

## 2018-06-20 LAB — DIGOXIN LEVEL: DIGOXIN LVL: 0.8 ng/mL (ref 0.8–2.0)

## 2018-06-20 NOTE — NC FL2 (Signed)
[place] MEDICAID FL2 LEVEL OF CARE SCREENING TOOL     IDENTIFICATION  Patient Name: Mary Cook Birthdate: 1926-03-20 Sex: female Admission Date (Current Location): 06/19/2018  [facility] and [place] Number:  [employer] and Address:  [facility] [place] [place], [place] [address], [place], [place] [address]      Provider Number: [id number]  Attending Physician Name and Address:  Hosie Poisson, MD  Relative Name and Phone Number:  [place] guardian;  Netta Cedars ([phone] ext [phone])    Current Level of Care: Hospital Recommended Level of Care: [place] Prior Approval Number:    Date Approved/Denied:   PASRR Number: [id number]  Discharge Plan: SNF    Current Diagnoses: Patient Active Problem List   Diagnosis Date Noted  . Seizure-like activity ([place]) 06/19/2018  . Closed intertrochanteric fracture of left femur ([place]) 06/02/2018  . Hip fracture ([place]) 06/01/2018  . HCAP (healthcare-associated pneumonia) 05/22/2018  . Elevated troponin 05/22/2018  . Sinus bradycardia 04/01/2018  . Anemia 03/31/2018  . Traumatic subarachnoid hemorrhage with loss of consciousness of 30 minutes or less ([place])   . Anemia of chronic disease 09/06/2016  . Gastroenteritis 07/22/2016  . Hip pain, chronic 06/03/2016  . Dyspnea 11/15/2015  . Acute respiratory failure with hypoxia ([place]) 11/15/2015  . CHF exacerbation ([place]) 11/15/2015  . Pain in the chest   . Chronic systolic CHF (congestive heart failure) ([place])   . Ischemic cardiomyopathy   . T12 compression fracture ([place])   . Intertrigo 02/28/2015  . Actinic keratoses 02/28/2015  . Noncompliance with diet and medication regimen 02/28/2015  . LBP (low back pain) 02/13/2015  . Acute on chronic renal insufficiency 02/04/2015  . Cardiomyopathy, ischemic-EF 30-35% by echo 01/31/15 02/04/2015  . Chronic kidney disease stage III 02/03/2015  . Chronic combined systolic and diastolic CHF (congestive heart  failure) ([place]) 01/30/2015  . Cystitis 10/28/2014  . Insomnia 10/28/2014  . Alzheimer's disease ([place]) 10/28/2014  . Falls frequently 10/12/2014  . Leg weakness, bilateral 10/12/2014  . OSA on CPAP 09/05/2014  . Rapid palpitations 06/07/2014  . Acute chest pain 06/06/2014  . Chest pain 06/06/2014  . Thoracic back pain after fall  04/13/2013  . Stress at home 01/26/2013  . GERD (gastroesophageal reflux disease) 01/25/2013  . SOB (shortness of breath) 08/28/2011  . Abnormal EKG, marked new ant TWI this adm with negative Troponins,08/18/11 08/20/2011  . CAD S/P CFX PCI Sept 2011 08/18/2011  . Sleep apnea, cpap had been stopped secondary to "sinus issues" 08/18/2011  . [name] deficiency 03/20/2010  . Diabetes mellitus ([place]) 01/07/2008  . Hyperlipidemia 01/07/2008  . Essential hypertension 04/25/2007  . DIVERTICULOSIS, COLON 04/25/2007  . OSTEOARTHRITIS 04/25/2007  . Disorder of bone and cartilage 04/25/2007  . DIVERTICULITIS, HX OF 04/25/2007    Orientation RESPIRATION BLADDER Height & Weight     Self, Place  Normal Incontinent, External catheter Weight: 124 lb 9 oz (56.5 kg) Height:  5\' 5"  (165.1 cm)  BEHAVIORAL SYMPTOMS/MOOD NEUROLOGICAL BOWEL NUTRITION STATUS      Incontinent Diet(heart healthy/carb modified diet)  AMBULATORY STATUS COMMUNICATION OF NEEDS Skin   Extensive Assist Verbally Surgical wounds(closed incision left leg)                       Personal Care Assistance Level of Assistance  Bathing, Feeding, Dressing Bathing Assistance: Maximum assistance Feeding assistance: Independent Dressing Assistance: Maximum assistance     Functional Limitations Info  Sight, Hearing, Speech Sight Info: Adequate  Hearing Info: Adequate Speech Info: Adequate    SPECIAL CARE FACTORS FREQUENCY  PT (By licensed PT), OT (By licensed OT)     PT Frequency: 5x week OT Frequency: 5x week            Contractures Contractures Info: Not present    Additional Factors Info   Code Status, Allergies, Psychotropic Code Status Info: DNR Allergies Info: CLOPIDOGREL BISULFATE, LIPITOR ATORVASTATIN, NAMENDA MEMANTINE HCL, LISINOPRIL  Psychotropic Info: levETIRAcetam (KEPPRA) tablet 500 mg 2x daily         Current Medications (06/20/2018):  This is the current hospital active medication list Current Facility-Administered Medications  Medication Dose Route Frequency Provider Last Rate Last Dose  . acetaminophen (TYLENOL) tablet 650 mg  650 mg Oral BID Wendee Beavers T, MD   650 mg at 06/20/18 1024  . aspirin EC tablet 81 mg  81 mg Oral BID Wendee Beavers T, MD   81 mg at 06/20/18 1025  . carvedilol (COREG) tablet 3.125 mg  3.125 mg Oral Q breakfast Wendee Beavers T, MD   3.125 mg at 06/20/18 0828  . digoxin (LANOXIN) tablet 0.125 mg  0.125 mg Oral Daily Wendee Beavers T, MD   0.125 mg at 06/20/18 1024  . enoxaparin (LOVENOX) injection 30 mg  30 mg Subcutaneous Daily Gonfa, Taye T, MD   30 mg at 06/20/18 1022  . furosemide (LASIX) tablet 20 mg  20 mg Oral QODAY Wendee Beavers T, MD   20 mg at 06/20/18 1024  . levETIRAcetam (KEPPRA) tablet 500 mg  500 mg Oral BID Wendee Beavers T, MD   500 mg at 06/20/18 1024  . mirtazapine (REMERON) tablet 15 mg  15 mg Oral QHS Wendee Beavers T, MD   15 mg at 06/20/18 0002  . sacubitril-valsartan (ENTRESTO) 24-26 mg per tablet  1 tablet Oral BID Mercy Riding, MD   1 tablet at 06/20/18 1024     Discharge Medications: Please see discharge summary for a list of discharge medications.  Relevant Imaging Results:  Relevant Lab Results:   Additional Information SSN [place], [place]

## 2018-06-20 NOTE — Progress Notes (Addendum)
NEURO HOSPITALIST PROGRESS NOTE   Subjective: Patient In bed, drowsy. Easy to arouse, but difficult to keep awake. 1.5 L [place] NAD. Speech helped patient eat breakfast. She was c/o pain, but once her left ankle was propped up she fell asleep. No one at bedside. Per nursing question of where patient/huband lives, who takes care of them ( might both have guardians), husband may also have dementia. In soft mitten restraints, patient has been pulling at items. No jerking movements or seizure like activity noted overnight.   Exam: Vitals:   06/20/18 0200 06/20/18 0300  BP: (!) 134/50 (!) 127/53  Pulse: 77 77  Resp: 18 17  Temp: 98.1 F (36.7 C) 98.1 F (36.7 C)  SpO2: 99% 100%    Physical Exam   HEENT-  Normocephalic, no lesions, without obvious abnormality.  Normal external eye and conjunctiva.   Cardiovascular- S1-S2 audible, pulses palpable throughout   Lungs-no rhonchi or wheezing noted, no excessive working breathing.  Saturations within normal limits on 1.5 L [place] Abdomen- All 4 quadrants palpated and nontender Extremities- Warm, dry and intact Musculoskeletal-no joint tenderness, deformity or swelling Skin- W/D left heel with pressure ulcer dressing.  Neuro:  Mental Status: Drowsy, easily aroused to light touch and name calling, but keeps falling back asleep. Oriented to name. Did not know age/month/year. Speech fluent without evidence of aphasia.  Able to follow some simple commands without difficulty. Cranial Nerves: II:  Visual fields grossly normal, ( difficult to keep patient awake long enough) III,IV, VI: ptosis not present, extra-ocular motions intact bilaterally pupils equal, round, reactive to light  V,VII: smile symmetric, facial light touch sensation normal bilaterally VIII: hearing normal bilaterally IX,X: uvula rises symmetrically XI: bilateral shoulder shrug XII: midline tongue extension Motor: BUE: would raise to anti-gravity BLE: able to lift  both anti-gravity Tone and bulk:normal tone throughout; no atrophy noted Sensory:  light touch intact throughout, bilaterally Deep Tendon Reflexes: 2+ and symmetric biceps and patella Plantars: Right: downgoing                                Left: downgoing Cerebellar: UTA, due to patient falling asleep and lack of attention Gait: deferred    Medications:  Scheduled: . acetaminophen  650 mg Oral BID  . aspirin EC  81 mg Oral BID  . carvedilol  3.125 mg Oral Q breakfast  . digoxin  0.125 mg Oral Daily  . enoxaparin (LOVENOX) injection  30 mg Subcutaneous Daily  . furosemide  20 mg Oral QODAY  . isosorbide mononitrate  10 mg Oral BID  . levETIRAcetam  500 mg Oral BID  . mirtazapine  15 mg Oral QHS  . sacubitril-valsartan  1 tablet Oral BID   Continuous:  PRN:  Pertinent Labs/Diagnostics: Routine EEG 06/20/18 The EEG isabnormal and findings are consistent withmild generalized cerebral dysfunction, epileptiform features were not seen during this recording  TSH: WNL Creatinine: 1.47 GFR:30 Digoxin: WNL Ammonia: WNL BUN: WNL   Dg Chest 2 View  Result Date: 06/19/2018 CLINICAL DATA:  Altered mental status. EXAM: CHEST - 2 VIEW COMPARISON:  Single-view of the chest 06/01/2018. PA and lateral chest 01/22/2017. CT chest 10/29/2015. FINDINGS: The lungs are clear. There is cardiomegaly. Aortic atherosclerosis is noted. No pneumothorax or pleural fluid. No acute or focal bony abnormality. IMPRESSION: No acute disease.  Cardiomegaly. Atherosclerosis. Electronically Signed   By: Inge Rise M.D.   On: 06/19/2018 11:23   Ct Head Wo Contrast  Result Date: 06/19/2018 CLINICAL DATA:  Altered mental status. EXAM: CT HEAD WITHOUT CONTRAST TECHNIQUE: Contiguous axial images were obtained from the base of the skull through the vertex without intravenous contrast. COMPARISON:  06/01/2018 and 03/31/2018 FINDINGS: Brain: Ventricles and cisterns are within normal. There is mild age related  atrophic change. There is an old right MCA/watershed territory infarct. There is chronic ischemic microvascular disease. Small old right basal ganglia infarct. No mass, mass effect, shift of midline structures or acute hemorrhage. No acute infarction. Vascular: No hyperdense vessel or unexpected calcification. Skull: Normal. Negative for fracture or focal lesion. Sinuses/Orbits: Orbits are normal. Paranasal sinuses are well developed with mild opacification over the left sphenoid sinus and floor the left maxillary sinus with significant interval improvement. Mastoid air cells are notable for minimal opacification over the inferior aspect right worse than left without significant change. Other: None. IMPRESSION: No acute findings. Chronic ischemic microvascular disease with old right-sided infarcts as described. Mild age related atrophy. Mild sinus inflammatory change with significant interval improvement. Electronically Signed   By: Marin Olp M.D.   On: 06/19/2018 11:39   Assessment:  With PMH DM 2, dementia, stroke ( 2002), traumatic SAH (12/2016) HTN, cerebral aneurysm, CHF with Diastolic HF who presented to [facility] for AMS. Routine EEG to evaluate for seizure. 1 dose of keppra 500 mg given in ED. EEG showed no epileptiform features, mild generalized cerebral dysfunction. Patient is at high risk for seizures and was Started on  500 mg keppra Bid.     Recommendations:  --continue Keppra 500 mg BID -F/u outpatient neurology - neurology to sign-off at this time. Page with any further concerns   Laurey Morale, MSN, NP-C [facility] [phone]   Attending neurologist's note to follow    06/20/2018, 8:19 AM   NEUROHOSPITALIST ADDENDUM Performed a face to face diagnostic evaluation.   I have reviewed the contents of history and physical exam as documented by PA/ARNP/Resident and agree with above documentation.  I have discussed and formulated the above plan as documented.  Edits to the note have been made as needed.   Appears back to her baseline.  Based on history likely this is a seizure.  I do not think she needs a MRI.  Outpatient neurology follow-up   Demetrios Byron MD [facility] [phone]   If 7pm to 7am, please call on call as listed on AMION.

## 2018-06-20 NOTE — Evaluation (Signed)
Physical Therapy Evaluation Patient Details Name: Mary Cook MRN: [id number] DOB: 09/08/1925 Today's Date: 06/20/2018   History of Present Illness  Pt is a 84 y/o female with past medical history significant for recent L hip fx s/p ORIF ([facility]), DM, dementia, stroke (2002), traumatic SAH (12/2016), HTN, cerebral aneurysm, CHF with Diastolic HF who presented to [facility] for AMS and seizure-like activity. CT was negative for any acute findings.    Clinical Impression  Pt presented supine in bed with HOB elevated, initially asleep and somewhat difficult to arouse but willing to participate in therapy session. Due to lethargy and cognitive deficits, pt unable to provide any reliable information regarding PLOF or home environment. Per chart review, pt is from a memory care center. Pt currently very limited secondary to lethargy and fatigue. She requires mod-total A x2 for bed mobility. She was able to sit EOB ~10 minutes, progressing from needing mod A to close min guard for brief periods of time. Pt on RA throughout with SPO2 maintaining >95%. All VSS throughout. Pt would continue to benefit from skilled physical therapy services at this time while admitted and after d/c to address the below listed limitations in order to improve overall safety and independence with functional mobility.     Follow Up Recommendations SNF    Equipment Recommendations  None recommended by PT    Recommendations for Other Services       Precautions / Restrictions Precautions Precautions: Fall Restrictions Weight Bearing Restrictions: Yes LLE Weight Bearing: Weight bearing as tolerated      Mobility  Bed Mobility Overal bed mobility: Needs Assistance Bed Mobility: Rolling;Sidelying to Sit;Sit to Supine Rolling: Mod assist Sidelying to sit: Total assist;+2 for physical assistance   Sit to supine: Total assist;+2 for physical assistance   General bed mobility comments: pt able to roll towards R with mod  A, total A x2 to achieve upright sitting EOB and to return to home  Transfers                 General transfer comment: deferred secondary to lethargy  Ambulation/Gait                Stairs            Wheelchair Mobility    Modified Rankin (Stroke Patients Only) Modified Rankin (Stroke Patients Only) Pre-Morbid Rankin Score: Severe disability Modified Rankin: Severe disability     Balance Overall balance assessment: Needs assistance Sitting-balance support: Feet supported Sitting balance-Leahy Scale: Poor Sitting balance - Comments: pt initially requiring mod A to maintain upright sitting balance at EOB, progressing to close min guard with bilateral UE supports for brief periods of time Postural control: Right lateral lean;Posterior lean                                   Pertinent Vitals/Pain Pain Assessment: No/denies pain    Home Living Family/patient expects to be discharged to:: Skilled nursing facility                 Additional Comments: Memory Care per chart review    Prior Function Level of Independence: Needs assistance         Comments: unsure - pt unable to provide any reliable information and no family/caregivers present during evaluation     Hand Dominance        Extremity/Trunk Assessment   Upper Extremity Assessment Upper Extremity Assessment: Defer  to OT evaluation    Lower Extremity Assessment Lower Extremity Assessment: Generalized weakness    Cervical / Trunk Assessment Cervical / Trunk Assessment: Kyphotic  Communication   Communication: No difficulties  Cognition Arousal/Alertness: Lethargic Behavior During Therapy: Flat affect Overall Cognitive Status: Impaired/Different from baseline Area of Impairment: Attention;Following commands;Safety/judgement;Problem solving                   Current Attention Level: Focused   Following Commands: Follows one step commands  inconsistently;Follows one step commands with increased time Safety/Judgement: Decreased awareness of deficits;Decreased awareness of safety   Problem Solving: Slow processing;Decreased initiation;Difficulty sequencing;Requires verbal cues;Requires tactile cues        General Comments      Exercises     Assessment/Plan    PT Assessment Patient needs continued PT services  PT Problem List Decreased strength;Decreased range of motion;Decreased activity tolerance;Decreased mobility;Decreased balance;Decreased coordination;Decreased cognition;Decreased knowledge of use of DME;Decreased safety awareness;Decreased knowledge of precautions       PT Treatment Interventions DME instruction;Gait training;Functional mobility training;Therapeutic activities;Balance training;Therapeutic exercise;Neuromuscular re-education;Cognitive remediation;Patient/family education    PT Goals (Current goals can be found in the Care Plan section)  Acute Rehab PT Goals Patient Stated Goal: none stated PT Goal Formulation: Patient unable to participate in goal setting Time For Goal Achievement: 07/04/18 Potential to Achieve Goals: Fair    Frequency Min 2X/week   Barriers to discharge        Co-evaluation PT/OT/SLP Co-Evaluation/Treatment: Yes Reason for Co-Treatment: For patient/therapist safety;To address functional/ADL transfers;Necessary to address cognition/behavior during functional activity PT goals addressed during session: Mobility/safety with mobility;Balance;Strengthening/ROM         AM-PAC PT "6 Clicks" Daily Activity  Outcome Measure Difficulty turning over in bed (including adjusting bedclothes, sheets and blankets)?: Unable Difficulty moving from lying on back to sitting on the side of the bed? : Unable Difficulty sitting down on and standing up from a chair with arms (e.g., wheelchair, bedside commode, etc,.)?: Unable Help needed moving to and from a bed to chair (including a  wheelchair)?: Total Help needed walking in hospital room?: Total Help needed climbing 3-5 steps with a railing? : Total 6 Click Score: 6    End of Session   Activity Tolerance: Patient limited by lethargy;Patient limited by fatigue Patient left: in bed;with call bell/phone within reach;with bed alarm set;Other (comment)(bilateral mittens applied) Nurse Communication: Mobility status PT Visit Diagnosis: Other abnormalities of gait and mobility (R26.89);Other symptoms and signs involving the nervous system (R29.898)    Time: 3888-2800 PT Time Calculation (min) (ACUTE ONLY): 23 min   Charges:   PT Evaluation $PT Eval Moderate Complexity: 1 Mod          Sherie Don, PT, DPT  Acute Rehabilitation Services Pager [phone] Office [place] 06/20/2018, 11:46 AM

## 2018-06-20 NOTE — Evaluation (Signed)
Clinical/Bedside Swallow Evaluation Patient Details  Name: Mary Cook MRN: [id number] Date of Birth: 29-Oct-1925  Today's Date: 06/20/2018 Time: SLP Start Time (ACUTE ONLY): 0820 SLP Stop Time (ACUTE ONLY): 0845 SLP Time Calculation (min) (ACUTE ONLY): 25 min  Past Medical History:  Past Medical History:  Diagnosis Date  . CAD (coronary artery disease)    a. 04/2010 MI DES x 2 to LCX;  b. 05/2014 Cath: EF 35%, patent LCX stents w/ 70% stenosis in small AV groove LCX, Diag 50-60, RCA 50-60p->Med Rx.  . Cerebral aneurysm   . Chronic combined systolic (congestive) and diastolic (congestive) heart failure ([place])    a. 01/2015 Echo: EF 30-35%, sev inflat HK, Gr 1DD, mild AI.  Marland Kitchen Diverticulosis of colon   . Essential hypertension   . Frequent urination   . GIB (gastrointestinal bleeding)    a. 2002 Diverticular bleed.  . Hyperlipidemia   . Ischemic cardiomyopathy    a. 01/2015 Echo: EF 30-35%.  . Osteoarthritis   . Osteopenia   . Skin disorder   . Sleep apnea    cpap therapy  . Stroke ([place]) 2002  . T12 compression fracture ([place])    a. 04/2015.  Marland Kitchen Thrombus   . Type II diabetes mellitus ([place])   . Visual disorder    Past Surgical History:  Past Surgical History:  Procedure Laterality Date  . ABDOMINAL HYSTERECTOMY  1980   no bso  . APPENDECTOMY    . CARDIAC CATHETERIZATION  07/2011   LAD OK, D1 80%, CFX 30% w/ patent stent, RCA 50-60%, EF 40%  . CARDIAC CATHETERIZATION    . CORONARY ANGIOPLASTY WITH STENT PLACEMENT  05/11/2010   stent CFX  . DOPPLER ECHOCARDIOGRAPHY    . FEMUR IM NAIL Left 06/02/2018   Procedure: INTRAMEDULLARY (IM) NAIL FEMORAL;  Surgeon: Renette Butters, MD;  Location: [facility] ORS;  Service: Orthopedics;  Laterality: Left;  . LEFT HEART CATHETERIZATION WITH CORONARY ANGIOGRAM N/A 08/18/2011   Procedure: LEFT HEART CATHETERIZATION WITH CORONARY ANGIOGRAM;  Surgeon: Wellington Hampshire, MD;  Location: [facility] CATH LAB;  Service: Cardiovascular;  Laterality: N/A;  .  LEFT HEART CATHETERIZATION WITH CORONARY ANGIOGRAM N/A 06/08/2014   Procedure: LEFT HEART CATHETERIZATION WITH CORONARY ANGIOGRAM;  Surgeon: Troy Sine, MD;  Location: [facility] CATH LAB;  Service: Cardiovascular;  Laterality: N/A;  . myocardial perfusion study    . THROMBECTOMY    . TONSILLECTOMY     HPI:  84 yo female adm to [facility] with seizure like activity.  Pt is a resident of [facility] and a ward of the state.  She has h/o dementia, s/p ORIF 2 weeks ago, OSA - ? Cpap and she demonstrated AMS- staring off and "foaming at mouth" per MD notes from family report.  Swallow evaluation ordered. CXR negative.  EEG showed mild cerebral dysfunction.     Assessment / Plan / Recommendation Clinical Impression  Patient demonstrates oral dysphagia - likely due to combination of edentulous status and sleepiness.  Initially pt unable to perform seal on straw - but after minimal intake- seal was adequte.  No indications of aspiration and multiple swallows noted with liquids.  She was able to feed herself however reported difficulty with bacon and eggs.  Oral residuals noted posterior midline with decreased pt awareness.  Use of grits helpful to clear.  Pt was able to manage grits mixed with eggs, applesauce and thin liquids alone.  No indication of aspiration with po observed.  Will modify diet to dys3/ground  meats/thin based on pt's performance and complaint of dysphagia.  Will follow up x1 to assure tolerance and no further modification needed to maximize nutrition.   SLP Visit Diagnosis: Dysphagia, oral phase (R13.11)    Aspiration Risk  Mild aspiration risk    Diet Recommendation Dysphagia 3 (Mech soft);Thin liquid   Liquid Administration via: Cup;Straw Medication Administration: Whole meds with liquid Supervision: Patient able to self feed Compensations: Slow rate;Small sips/bites Postural Changes: Seated upright at 90 degrees;Remain upright for at least 30 minutes after po intake    Other   Recommendations Oral Care Recommendations: Oral care BID   Follow up Recommendations (tbd)      Frequency and Duration min 1 x/week  1 week       Prognosis Prognosis for Safe Diet Advancement: Fair Barriers to Reach Goals: Cognitive deficits      Swallow Study   General Date of Onset: 06/20/18 HPI: 84 yo female adm [place] with seizure like activity.  Pt is a resident of [facility] and a ward of the state.  She has h/o dementia, s/p ORIF 2 weeks ago, OSA - ? Cpap and she demonstrated AMS- staring off and "foaming at mouth" per MD notes from family report.  Swallow evaluation ordered. CXR negative.  EEG showed mild cerebral dysfunction.   Type of Study: Bedside Swallow Evaluation Diet Prior to this Study: Dysphagia 3 (soft);Thin liquids Temperature Spikes Noted: No Respiratory Status: Nasal cannula History of Recent Intubation: No Behavior/Cognition: Alert Oral Cavity Assessment: Within Functional Limits Oral Care Completed by SLP: No Oral Cavity - Dentition: Edentulous Vision: Functional for self-feeding Self-Feeding Abilities: Able to feed self;Needs assist Patient Positioning: Upright in bed Baseline Vocal Quality: Low vocal intensity(uncertain to baseline) Volitional Cough: Cognitively unable to elicit Volitional Swallow: Unable to elicit(d/t xerostomia)    Oral/Motor/Sensory Function Overall Oral Motor/Sensory Function: Generalized oral weakness   Ice Chips Ice chips: Not tested   Thin Liquid Thin Liquid: Impaired Presentation: Straw;Cup;Self Fed Oral Phase Impairments: Reduced labial seal Other Comments: initially unable to form seal on straw, however after po commenced pt able to propel boluses, multiple swallows    Nectar Thick Nectar Thick Liquid: Not tested   Honey Thick Honey Thick Liquid: Not tested   Puree Puree: Within functional limits Presentation: Self Fed;Spoon   Solid     Solid: Impaired Oral Phase Impairments: Reduced labial seal;Reduced lingual  movement/coordination Oral Phase Functional Implications: Impaired mastication;Oral residue;Other (comment);Prolonged oral transit(posterior tongue midline )      Macario Golds 06/20/2018,9:12 AM Luanna Salk, [place] SLP Acute Rehab Services Pager [phone] Office [phone]

## 2018-06-20 NOTE — Progress Notes (Signed)
PROGRESS NOTE    Mary Cook  UUV:[id number] DOB: [birth year] DOA: 06/19/2018 PCP: Rosita Fire, MD    Brief Narrative: Mary Cook is a 84 y.o. female with medical history significant for dementia, CVA, CAD status post stent placement in 2011 in 4742, combined systolic and diastolic CHF, OSA on CPAP, recent left hip fracture status post ORIF 2 weeks ago who presented with seizure-like activity  Assessment & Plan:   Principal Problem:   Seizure-like activity ([place]) Active Problems:   Diabetes mellitus ([place])   Essential hypertension   CAD S/P CFX PCI Sept 2011   OSA on CPAP   Chronic combined systolic and diastolic CHF (congestive heart failure) ([place])   Chronic kidney disease stage III   Anemia of chronic disease  Seizure-like episode at the memory care unit Patient appears to be postictal she is lethargic barely responding to verbal cues only moaning on painful stimuli.  She was started on Keppra by neurology.  EEG done showed moderate cerebral dysfunction without any epileptiform activity.  She is afebrile with no signs of infection.  Lactic acid within normal limits.  Urinalysis does not show any infection.  CT head shows Chronic ischemic microvascular disease with old right-sided infarcts.  Chest x-ray does not show any disease.  She remains lethargic and encephalopathic at this time.  TSH within normal limits serum digoxin level is 0.8.  EKG shows sinus rhythm with incomplete left bundle branch block.  Borderline T wave abnormalities in the lateral leads.   Diabetes mellitus Resume sliding scale insulin at this time.   Hypertension well controlled resume Coreg   Stage 3 CKD Creatinine at baseline.     DVT prophylaxis: Lovenox Code Status: DNR  family Communication: At bedside Disposition Plan: Pending evaluation by neurology and physical therapy   Consultants:   Neurology   Procedures: EEG   Antimicrobials:  None   Subjective: Lethargic  Objective: Vitals:   06/20/18 0300 06/20/18 0315 06/20/18 0845 06/20/18 1131  BP: (!) 127/53  (!) 149/81 (!) 96/44  Pulse: 77  85 69  Resp: 17  18 16   Temp: 98.1 F (36.7 C)  98.2 F (36.8 C) 98.6 F (37 C)  TempSrc: Axillary  Axillary Axillary  SpO2: 100%   99%  Weight:  56.5 kg    Height:       No intake or output data in the 24 hours ending 06/20/18 1313 Filed Weights   06/19/18 1037 06/20/18 0315  Weight: 57.8 kg 56.5 kg    Examination:  General exam: Appears calm, on 1 L of nasal cannula oxygen Respiratory system: Diminished at bases, no wheezing or rhonchi Cardiovascular system: S1 & S2 heard, RRR. No JVD, No pedal edema. Gastrointestinal system: Abdomen is nondistended, soft and nontender. Normal bowel sounds heard. Central nervous system: Lethargic no response to verbal cues only responding with moaning on painful stimuli at this time. Extremities: No cyanosis or edema Skin: No rashes, lesions or ulcers Psychiatry: PSS    Data Reviewed: I have personally reviewed following labs and imaging studies  CBC: Recent Labs  Lab 06/19/18 1038 06/20/18 0003 06/20/18 0505  WBC 7.4 6.0 5.3  NEUTROABS 5.4  --   --   HGB 9.3* 8.2* 7.6*  HCT 31.3* 26.7* 24.7*  MCV 96.6 93.7 94.3  PLT 317 300 595   Basic Metabolic Panel: Recent Labs  Lab 06/19/18 1038 06/20/18 0003 06/20/18 0505  NA 141  --  141  K 4.2  --  3.5  CL 107  --  109  CO2 26  --  24  GLUCOSE 96  --  89  BUN 21  --  20  CREATININE 1.45* 1.47* 1.43*  CALCIUM 9.3  --  8.9   GFR: Estimated Creatinine Clearance: 23.3 mL/min (A) (by C-G formula based on SCr of 1.43 mg/dL (H)). Liver Function Tests: Recent Labs  Lab 06/19/18 1038 06/20/18 0505  AST 14* 13*  ALT 9 8  ALKPHOS 101 83  BILITOT 0.5 0.5  PROT 6.5 5.5*  ALBUMIN 2.8* 2.4*   No results for input(s): LIPASE, AMYLASE in the last 168 hours. Recent Labs  Lab 06/19/18 1038  AMMONIA 20   Coagulation  Profile: No results for input(s): INR, PROTIME in the last 168 hours. Cardiac Enzymes: No results for input(s): CKTOTAL, CKMB, CKMBINDEX, TROPONINI in the last 168 hours. BNP (last 3 results) No results for input(s): PROBNP in the last 8760 hours. HbA1C: No results for input(s): HGBA1C in the last 72 hours. CBG: No results for input(s): GLUCAP in the last 168 hours. Lipid Profile: No results for input(s): CHOL, HDL, LDLCALC, TRIG, CHOLHDL, LDLDIRECT in the last 72 hours. Thyroid Function Tests: Recent Labs    06/20/18 0003  TSH 4.260   Anemia Panel: No results for input(s): VITAMINB12, FOLATE, FERRITIN, TIBC, IRON, RETICCTPCT in the last 72 hours. Sepsis Labs: Recent Labs  Lab 06/19/18 1140  LATICACIDVEN 1.00    No results found for this or any previous visit (from the past 240 hour(s)).       Radiology Studies: Dg Chest 2 View  Result Date: 06/19/2018 CLINICAL DATA:  Altered mental status. EXAM: CHEST - 2 VIEW COMPARISON:  Single-view of the chest 06/01/2018. PA and lateral chest 01/22/2017. CT chest 10/29/2015. FINDINGS: The lungs are clear. There is cardiomegaly. Aortic atherosclerosis is noted. No pneumothorax or pleural fluid. No acute or focal bony abnormality. IMPRESSION: No acute disease. Cardiomegaly. Atherosclerosis. Electronically Signed   By: Inge Rise M.D.   On: 06/19/2018 11:23   Ct Head Wo Contrast  Result Date: 06/19/2018 CLINICAL DATA:  Altered mental status. EXAM: CT HEAD WITHOUT CONTRAST TECHNIQUE: Contiguous axial images were obtained from the base of the skull through the vertex without intravenous contrast. COMPARISON:  06/01/2018 and 03/31/2018 FINDINGS: Brain: Ventricles and cisterns are within normal. There is mild age related atrophic change. There is an old right MCA/watershed territory infarct. There is chronic ischemic microvascular disease. Small old right basal ganglia infarct. No mass, mass effect, shift of midline structures or acute  hemorrhage. No acute infarction. Vascular: No hyperdense vessel or unexpected calcification. Skull: Normal. Negative for fracture or focal lesion. Sinuses/Orbits: Orbits are normal. Paranasal sinuses are well developed with mild opacification over the left sphenoid sinus and floor the left maxillary sinus with significant interval improvement. Mastoid air cells are notable for minimal opacification over the inferior aspect right worse than left without significant change. Other: None. IMPRESSION: No acute findings. Chronic ischemic microvascular disease with old right-sided infarcts as described. Mild age related atrophy. Mild sinus inflammatory change with significant interval improvement. Electronically Signed   By: Marin Olp M.D.   On: 06/19/2018 11:39        Scheduled Meds: . acetaminophen  650 mg Oral BID  . aspirin EC  81 mg Oral BID  . carvedilol  3.125 mg Oral Q breakfast  . digoxin  0.125 mg Oral Daily  . enoxaparin (LOVENOX) injection  30 mg Subcutaneous Daily  . furosemide  20 mg Oral  QODAY  . isosorbide mononitrate  10 mg Oral BID  . levETIRAcetam  500 mg Oral BID  . mirtazapine  15 mg Oral QHS  . sacubitril-valsartan  1 tablet Oral BID   Continuous Infusions:   LOS: 0 days    Time spent: 34 minutes.     Hosie Poisson, MD [facility] Pager [phone]  If 7PM-7AM, please contact night-coverage [email or web address] Password [email or web address] 06/20/2018, 1:13 PM

## 2018-06-20 NOTE — Progress Notes (Signed)
Left heel open to air; round stage 2 blister/ulcer on heel; cleansed and pink foam placed; will consult wound RN for further assessment; tx plan.

## 2018-06-20 NOTE — Evaluation (Signed)
Occupational Therapy Evaluation Patient Details Name: Mary Cook MRN: [id number] DOB: January 29, 1926 Today's Date: 06/20/2018    History of Present Illness Pt is a 84 y/o female with past medical history significant for recent L hip fx s/p ORIF ([facility]), DM, dementia, stroke (2002), traumatic SAH (12/2016), HTN, cerebral aneurysm, CHF with Diastolic HF who presented to [facility] for AMS and seizure-like activity. CT was negative for any acute findings.   Clinical Impression   PTA patient lived at memory care unit (per chart review) and is unable to provide reliable PLOF.  She was supine in bed and lethargic upon entry, but willing to participate with therapy.  She was admitted to above and limited by impaired cognition, impaired balance and generalized weakness.  She currently requires total assist for all ADLs and +2 assist mod-total assist for bed mobility.  Pt inconsistently follows 1 step commands.  Limited evaluation.  Pt will benefit from continued OT services while admitted and recommend follow up at SNF in order to optimize return to PLOF.  Will continue to follow.    Follow Up Recommendations  SNF    Equipment Recommendations  None recommended by OT    Recommendations for Other Services       Precautions / Restrictions Precautions Precautions: Fall Restrictions Weight Bearing Restrictions: Yes LLE Weight Bearing: Weight bearing as tolerated      Mobility Bed Mobility Overal bed mobility: Needs Assistance Bed Mobility: Rolling;Sidelying to Sit;Sit to Supine Rolling: Mod assist Sidelying to sit: Total assist;+2 for physical assistance   Sit to supine: Total assist;+2 for physical assistance   General bed mobility comments: pt able to roll towards R with mod A, total A x2 to achieve upright sitting EOB and to return to supine  Transfers                 General transfer comment: deferred secondary to lethargy    Balance Overall balance assessment: Needs  assistance Sitting-balance support: Feet supported Sitting balance-Leahy Scale: Poor Sitting balance - Comments: pt initially requiring mod A to maintain upright sitting balance at EOB, progressing to close min guard with bilateral UE supports for brief periods of time Postural control: Right lateral lean;Posterior lean                                 ADL either performed or assessed with clinical judgement   ADL Overall ADL's : Needs assistance/impaired     Grooming: Total assistance;Bed level   Upper Body Bathing: Total assistance;Bed level   Lower Body Bathing: Total assistance;Bed level   Upper Body Dressing : Total assistance;Bed level   Lower Body Dressing: Total assistance;+2 for physical assistance;Bed level     Toilet Transfer Details (indicate cue type and reason): deferred          Functional mobility during ADLs: Maximal assistance;+2 for physical assistance(bed mobility only) General ADL Comments: limited eval.  able to transition to EOB, engage in EOB activities then returned to supine      Vision   Additional Comments: difficult to assess, pt able to open eyes during session but does not maintain open eyes. unable to track or follow commands to turn head/visual input     Perception     Praxis      Pertinent Vitals/Pain Pain Assessment: No/denies pain     Hand Dominance     Extremity/Trunk Assessment Upper Extremity Assessment Upper Extremity Assessment: Generalized weakness;Difficult  to assess due to impaired cognition   Lower Extremity Assessment Lower Extremity Assessment: Defer to PT evaluation   Cervical / Trunk Assessment Cervical / Trunk Assessment: Kyphotic   Communication Communication Communication: No difficulties   Cognition Arousal/Alertness: Lethargic Behavior During Therapy: Flat affect Overall Cognitive Status: Impaired/Different from baseline Area of Impairment: Attention;Following  commands;Safety/judgement;Problem solving                   Current Attention Level: Focused   Following Commands: Follows one step commands inconsistently;Follows one step commands with increased time Safety/Judgement: Decreased awareness of deficits;Decreased awareness of safety   Problem Solving: Slow processing;Decreased initiation;Difficulty sequencing;Requires verbal cues;Requires tactile cues     General Comments       Exercises     Shoulder Instructions      Home Living Family/patient expects to be discharged to:: Skilled nursing facility                                 Additional Comments: Memory Care per chart review      Prior Functioning/Environment Level of Independence: Needs assistance        Comments: unsure - pt unable to provide any reliable information and no family/caregivers present during evaluation        OT Problem List: Decreased strength;Decreased activity tolerance;Impaired balance (sitting and/or standing);Decreased cognition;Decreased safety awareness;Impaired vision/perception;Decreased coordination;Decreased knowledge of use of DME or AE;Decreased knowledge of precautions      OT Treatment/Interventions: Self-care/ADL training;Therapeutic exercise;DME and/or AE instruction;Therapeutic activities;Patient/family education;Balance training    OT Goals(Current goals can be found in the care plan section) Acute Rehab OT Goals Patient Stated Goal: none stated Time For Goal Achievement: 07/04/18 Potential to Achieve Goals: Fair  OT Frequency: Min 2X/week   Barriers to D/C:            Co-evaluation PT/OT/SLP Co-Evaluation/Treatment: Yes Reason for Co-Treatment: For patient/therapist safety;To address functional/ADL transfers;Necessary to address cognition/behavior during functional activity PT goals addressed during session: Mobility/safety with mobility;Balance;Strengthening/ROM OT goals addressed during session:  ADL's and self-care;Other (comment)(mobility)      AM-PAC PT "6 Clicks" Daily Activity     Outcome Measure Help from another person eating meals?: Total Help from another person taking care of personal grooming?: Total Help from another person toileting, which includes using toliet, bedpan, or urinal?: Total Help from another person bathing (including washing, rinsing, drying)?: Total Help from another person to put on and taking off regular upper body clothing?: Total Help from another person to put on and taking off regular lower body clothing?: Total 6 Click Score: 6   End of Session Equipment Utilized During Treatment: Oxygen Nurse Communication: Mobility status  Activity Tolerance: Patient limited by fatigue;Patient limited by lethargy Patient left: in bed;with bed alarm set;with call bell/phone within reach  OT Visit Diagnosis: Muscle weakness (generalized) (M62.81);Other symptoms and signs involving the nervous system (R29.898);Other symptoms and signs involving cognitive function                Time: 1000-1020 OT Time Calculation (min): 20 min Charges:  OT General Charges $OT Visit: 1 Visit OT Evaluation $OT Eval Moderate Complexity: [place], OT Acute Rehabilitation Services Pager [phone] Office [phone]   Delight Stare 06/20/2018, 12:39 PM

## 2018-06-20 NOTE — Progress Notes (Signed)
Patient declined CPAP at this time, no distress noted.  

## 2018-06-20 NOTE — Social Work (Signed)
Message left for pt guardian through [place]. DSS guardian;  Netta Cedars [phone] ext [phone]) Continuing to follow.  Westley Hummer, MSW, [place] [employer] [phone]

## 2018-06-20 NOTE — Consult Note (Signed)
[place] Nurse wound consult note Reason for Consult: Left heel Stage 3 pressure injury (POA). Patient reports that her heel has been hurting "all day". Wound type:Pressure Pressure Injury POA: Yes Measurement: 4cm x 5.5cm x 0.2cm Wound bed: with dried blood at periphery, red, moist wound bed Drainage (amount, consistency, odor) small to moderate amount serous exudate Periwound: intact, dry Dressing procedure/placement/frequency: I will provide Nursing with guidance via the Orders for daily dressings with calcium alginate topped with gauze and secured with roll gauze/paper tape.  Bilateral Pressure redistribution heel boots are provided.  LEs should be elevated and a pressure redistribution chair pad is provided for her use when OOB to chair as the leg elevation will increase pressure to the ischial tuberosities. A sacral prophylactic dressing is ordered to her sacrum while in house.  [place] nursing team will not follow, but will remain available to this patient, the nursing and medical teams.  Please re-consult if needed. Thanks, Maudie Flakes, MSN, RN, [place], Arther Abbott  Pager# [phone]

## 2018-06-21 DIAGNOSIS — Z9861 Coronary angioplasty status: Secondary | ICD-10-CM

## 2018-06-21 DIAGNOSIS — I5042 Chronic combined systolic (congestive) and diastolic (congestive) heart failure: Secondary | ICD-10-CM

## 2018-06-21 DIAGNOSIS — I2510 Atherosclerotic heart disease of native coronary artery without angina pectoris: Secondary | ICD-10-CM

## 2018-06-21 DIAGNOSIS — R569 Unspecified convulsions: Secondary | ICD-10-CM

## 2018-06-21 DIAGNOSIS — D638 Anemia in other chronic diseases classified elsewhere: Secondary | ICD-10-CM

## 2018-06-21 LAB — GLUCOSE, CAPILLARY: Glucose-Capillary: 97 mg/dL (ref 70–99)

## 2018-06-21 LAB — BASIC METABOLIC PANEL
ANION GAP: 7 (ref 5–15)
BUN: 18 mg/dL (ref 8–23)
CALCIUM: 8.9 mg/dL (ref 8.9–10.3)
CO2: 25 mmol/L (ref 22–32)
Chloride: 107 mmol/L (ref 98–111)
Creatinine, Ser: 1.41 mg/dL — ABNORMAL HIGH (ref 0.44–1.00)
GFR, EST AFRICAN AMERICAN: 37 mL/min — AB (ref >60)
GFR, EST NON AFRICAN AMERICAN: 32 mL/min — AB (ref >60)
GLUCOSE: 103 mg/dL — AB (ref 70–99)
Potassium: 3.6 mmol/L (ref 3.5–5.1)
Sodium: 139 mmol/L (ref 135–145)

## 2018-06-21 LAB — CBC
HCT: 26.6 % — ABNORMAL LOW (ref 36.0–46.0)
Hemoglobin: 8.3 g/dL — ABNORMAL LOW (ref 12.0–15.0)
MCH: 29 pg (ref 26.0–34.0)
MCHC: 31.2 g/dL (ref 30.0–36.0)
MCV: 93 fL (ref 80.0–100.0)
PLATELETS: 270 10*3/uL (ref 150–400)
RBC: 2.86 MIL/uL — ABNORMAL LOW (ref 3.87–5.11)
RDW: 12.6 % (ref 11.5–15.5)
WBC: 5.3 10*3/uL (ref 4.0–10.5)
nRBC: 0 % (ref 0.0–0.2)

## 2018-06-21 MED ORDER — INSULIN ASPART 100 UNIT/ML ~~LOC~~ SOLN: 0.0000 [IU] | Freq: Every day | SUBCUTANEOUS | Status: DC

## 2018-06-21 MED ORDER — ORAL CARE MOUTH RINSE
15.0000 mL | Freq: Two times a day (BID) | OROMUCOSAL | Status: DC
  Administered 2018-06-21 – 2018-06-25 (×8): 15 mL via OROMUCOSAL

## 2018-06-21 MED ORDER — INSULIN ASPART 100 UNIT/ML ~~LOC~~ SOLN
0.0000 [IU] | Freq: Three times a day (TID) | SUBCUTANEOUS | Status: DC
  Administered 2018-06-23 (×2): 1 [IU] via SUBCUTANEOUS
  Administered 2018-06-24: 2 [IU] via SUBCUTANEOUS
  Administered 2018-06-24: 1 [IU] via SUBCUTANEOUS
  Administered 2018-06-25: 2 [IU] via SUBCUTANEOUS

## 2018-06-21 NOTE — Progress Notes (Signed)
  Speech Language Pathology Treatment: Dysphagia  Patient Details Name: Mary Cook MRN: [id number] DOB: [birth year] Today's Date: 06/21/2018 Time: 8563-1497 SLP Time Calculation (min) (ACUTE ONLY): 25 min  Assessment / Plan / Recommendation Clinical Impression  Patient was very tired. Nurse reported patient not eating much, masticating food for a very long time and re-guardless of many verbal cues taking 1-2 minutes to swallow each bite. Gave patient bites from tray and observed patient do the same thing. She has poor bolus control, prolonged mastication and requires verbal and tactile cues to initiate a swallow for solids. At times patient did not swallow and solids had to be removed from oral cavity. She initiates swallow of liquids in a timely manner. Recommend diet downgrade to Dys 1 (Puree) to aid in inability to masticate and manipulate bolus effectively. Medications to be given crushed in puree. Full assistance for feeding to provide cueing for swallowing and tongue sweep to assure oral cavity is clear. Speech therapy will monitor tolerance of current diet, make any necessary diet changes.    HPI HPI: 84 yo female adm [place] with seizure like activity.  Pt is a resident of [facility] and a [place] of the state.  She has h/o dementia, s/p ORIF 2 weeks ago, OSA - ? Cpap and she demonstrated AMS- staring off and "foaming at mouth" per MD notes from family report.  Swallow evaluation ordered. CXR negative.  EEG showed mild cerebral dysfunction.        SLP Plan  Continue with current plan of care       Recommendations  Diet recommendations: Dysphagia 1 (puree);Thin liquid Liquids provided via: Cup Medication Administration: Crushed with puree Supervision: Full supervision/cueing for compensatory strategies;Staff to assist with self feeding Compensations: Slow rate;Small sips/bites;Lingual sweep for clearance of pocketing;Follow solids with liquid Postural Changes and/or Swallow  Maneuvers: Seated upright 90 degrees                Oral Care Recommendations: Oral care BID Follow up Recommendations: Skilled Nursing facility SLP Visit Diagnosis: Dysphagia, oral phase (R13.11) Plan: Continue with current plan of care       [place], MA, CCC-SLP 06/21/2018 2:44 PM

## 2018-06-21 NOTE — Progress Notes (Addendum)
PROGRESS NOTE    REKA WIST  GUY:[id number] DOB: 1926/08/07 DOA: 06/19/2018 PCP: Rosita Fire, MD    Brief Narrative:   Mary Cook is a 84 y.o. female with medical history significant for dementia, CVA, CAD status post stent placement in 2011 in 5638, combined systolic and diastolic CHF, OSA on CPAP, recent left hip fracture status post ORIF 2 weeks ago who presented with seizure-like activity.   Assessment & Plan:   Principal Problem:   Seizure-like activity ([place]) Active Problems:   Diabetes mellitus ([place])   Essential hypertension   CAD S/P CFX PCI Sept 2011   OSA on CPAP   Chronic combined systolic and diastolic CHF (congestive heart failure) ([facility])   Chronic kidney disease stage III   Anemia of chronic disease  Seizure-like episode at the memory care unit No seizures since admission.  Slightly lethargic this am  She was started on Keppra by neurology.  EEG done showed moderate cerebral dysfunction without any epileptiform activity.  She is afebrile with no signs of infection.  Lactic acid within normal limits.  Urinalysis does not show any infection.  CT head shows Chronic ischemic microvascular disease with old right-sided infarcts.  Chest x-ray does not show any disease.  She remains lethargic at this time.  TSH within normal limits serum digoxin level is 0.8.  EKG shows sinus rhythm with incomplete left bundle branch block.  Borderline T wave abnormalities in the lateral leads.   Diabetes mellitus Resume sliding scale insulin at this time.   Hypertension  well controlled  resume Coreg   Stage 3 CKD Creatinine at baseline.  Chronic systolic and diastolic heart failure: Euvolemic. Echo from 04/2018  shows Left ventricle: The cavity size was normal. Wall thickness was   normal. Systolic function was moderately to severely reduced. The   estimated ejection fraction was in the range of 30% to 35%.   Diffuse hypokinesis. There is severe hypokinesis of  the   inferolateral and inferior myocardium. Indeterminate diastolic   function.   Stage 3 heel pressure ulcer on the left: Wound care consulted and recommendations given.    Recent left femur fracture: S/p intramedullary nailing of the left hip recently.  Pain control.    DVT prophylaxis: Lovenox Code Status: DNR  family Communication: None at bedside Disposition Plan: possible d/c in am.    Consultants:   Neurology   Procedures: EEG   Antimicrobials: None   Subjective: Alert and able to answer simple questions.  No chest pain, or sob.  Reports her foot pain is better with pain meds.   Objective: Vitals:   06/21/18 0447 06/21/18 0737 06/21/18 1140 06/21/18 1300  BP:  (!) 151/50 (!) 152/47   Pulse:      Resp:      Temp:    98.4 F (36.9 C)  TempSrc:    Axillary  SpO2:  97% 94%   Weight: 56.5 kg     Height:        Intake/Output Summary (Last 24 hours) at 06/21/2018 1609 Last data filed at 06/21/2018 1230 Gross per 24 hour  Intake 290 ml  Output -  Net 290 ml   Filed Weights   06/19/18 1037 06/20/18 0315 06/21/18 0447  Weight: 57.8 kg 56.5 kg 56.5 kg    Examination:  General exam: calm and not in distress.  Respiratory system: Diminished at bases no wheezing or rhonchi Cardiovascular system: S1 & S2 heard, RRR. No JVD, No pedal edema. Gastrointestinal system: Abdomen  is soft, nontender, nondistended with good bowel sounds Central nervous system: Responding to questions following commands able to move her extremities Extremities: No cyanosis or edema Skin: Stage III left heel pressure ulcer Psychiatry: Unable to assess    Data Reviewed: I have personally reviewed following labs and imaging studies  CBC: Recent Labs  Lab 06/19/18 1038 06/20/18 0003 06/20/18 0505 06/21/18 1110  WBC 7.4 6.0 5.3 5.3  NEUTROABS 5.4  --   --   --   HGB 9.3* 8.2* 7.6* 8.3*  HCT 31.3* 26.7* 24.7* 26.6*  MCV 96.6 93.7 94.3 93.0  PLT 317 300 267 196    Basic Metabolic Panel: Recent Labs  Lab 06/19/18 1038 06/20/18 0003 06/20/18 0505 06/21/18 1110  NA 141  --  141 139  K 4.2  --  3.5 3.6  CL 107  --  109 107  CO2 26  --  24 25  GLUCOSE 96  --  89 103*  BUN 21  --  20 18  CREATININE 1.45* 1.47* 1.43* 1.41*  CALCIUM 9.3  --  8.9 8.9   GFR: Estimated Creatinine Clearance: 23.7 mL/min (A) (by C-G formula based on SCr of 1.41 mg/dL (H)). Liver Function Tests: Recent Labs  Lab 06/19/18 1038 06/20/18 0505  AST 14* 13*  ALT 9 8  ALKPHOS 101 83  BILITOT 0.5 0.5  PROT 6.5 5.5*  ALBUMIN 2.8* 2.4*   No results for input(s): LIPASE, AMYLASE in the last 168 hours. Recent Labs  Lab 06/19/18 1038  AMMONIA 20   Coagulation Profile: No results for input(s): INR, PROTIME in the last 168 hours. Cardiac Enzymes: No results for input(s): CKTOTAL, CKMB, CKMBINDEX, TROPONINI in the last 168 hours. BNP (last 3 results) No results for input(s): PROBNP in the last 8760 hours. HbA1C: No results for input(s): HGBA1C in the last 72 hours. CBG: No results for input(s): GLUCAP in the last 168 hours. Lipid Profile: No results for input(s): CHOL, HDL, LDLCALC, TRIG, CHOLHDL, LDLDIRECT in the last 72 hours. Thyroid Function Tests: Recent Labs    06/20/18 0003  TSH 4.260   Anemia Panel: No results for input(s): VITAMINB12, FOLATE, FERRITIN, TIBC, IRON, RETICCTPCT in the last 72 hours. Sepsis Labs: Recent Labs  Lab 06/19/18 1140  LATICACIDVEN 1.00    No results found for this or any previous visit (from the past 240 hour(s)).       Radiology Studies: No results found.      Scheduled Meds: . acetaminophen  650 mg Oral BID  . aspirin EC  81 mg Oral BID  . carvedilol  3.125 mg Oral Q breakfast  . digoxin  0.125 mg Oral Daily  . enoxaparin (LOVENOX) injection  30 mg Subcutaneous Daily  . furosemide  20 mg Oral QODAY  . levETIRAcetam  500 mg Oral BID  . mouth rinse  15 mL Mouth Rinse BID  . mirtazapine  15 mg Oral QHS   . sacubitril-valsartan  1 tablet Oral BID   Continuous Infusions:   LOS: 0 days    Time spent: 30 minutes.     Hosie Poisson, MD [facility] Pager [phone]  If 7PM-7AM, please contact night-coverage [email or web address] Password [email or web address] 06/21/2018, 4:09 PM

## 2018-06-22 ENCOUNTER — Observation Stay (HOSPITAL_COMMUNITY): Payer: Medicare HMO

## 2018-06-22 DIAGNOSIS — L8990 Pressure ulcer of unspecified site, unspecified stage: Secondary | ICD-10-CM

## 2018-06-22 DIAGNOSIS — I255 Ischemic cardiomyopathy: Secondary | ICD-10-CM | POA: Diagnosis present

## 2018-06-22 DIAGNOSIS — D638 Anemia in other chronic diseases classified elsewhere: Secondary | ICD-10-CM | POA: Diagnosis not present

## 2018-06-22 DIAGNOSIS — S7292XD Unspecified fracture of left femur, subsequent encounter for closed fracture with routine healing: Secondary | ICD-10-CM | POA: Diagnosis not present

## 2018-06-22 DIAGNOSIS — I5042 Chronic combined systolic (congestive) and diastolic (congestive) heart failure: Secondary | ICD-10-CM | POA: Diagnosis present

## 2018-06-22 DIAGNOSIS — Z66 Do not resuscitate: Secondary | ICD-10-CM | POA: Diagnosis present

## 2018-06-22 DIAGNOSIS — R569 Unspecified convulsions: Secondary | ICD-10-CM | POA: Diagnosis present

## 2018-06-22 DIAGNOSIS — R4182 Altered mental status, unspecified: Secondary | ICD-10-CM

## 2018-06-22 DIAGNOSIS — X58XXXD Exposure to other specified factors, subsequent encounter: Secondary | ICD-10-CM | POA: Diagnosis present

## 2018-06-22 DIAGNOSIS — D631 Anemia in chronic kidney disease: Secondary | ICD-10-CM | POA: Diagnosis present

## 2018-06-22 DIAGNOSIS — K219 Gastro-esophageal reflux disease without esophagitis: Secondary | ICD-10-CM | POA: Diagnosis present

## 2018-06-22 DIAGNOSIS — I2510 Atherosclerotic heart disease of native coronary artery without angina pectoris: Secondary | ICD-10-CM | POA: Diagnosis present

## 2018-06-22 DIAGNOSIS — L89620 Pressure ulcer of left heel, unstageable: Secondary | ICD-10-CM | POA: Diagnosis not present

## 2018-06-22 DIAGNOSIS — E1122 Type 2 diabetes mellitus with diabetic chronic kidney disease: Secondary | ICD-10-CM | POA: Diagnosis present

## 2018-06-22 DIAGNOSIS — G309 Alzheimer's disease, unspecified: Secondary | ICD-10-CM | POA: Diagnosis present

## 2018-06-22 DIAGNOSIS — I130 Hypertensive heart and chronic kidney disease with heart failure and stage 1 through stage 4 chronic kidney disease, or unspecified chronic kidney disease: Secondary | ICD-10-CM | POA: Diagnosis present

## 2018-06-22 DIAGNOSIS — Z9989 Dependence on other enabling machines and devices: Secondary | ICD-10-CM | POA: Diagnosis not present

## 2018-06-22 DIAGNOSIS — F0280 Dementia in other diseases classified elsewhere without behavioral disturbance: Secondary | ICD-10-CM | POA: Diagnosis present

## 2018-06-22 DIAGNOSIS — M8580 Other specified disorders of bone density and structure, unspecified site: Secondary | ICD-10-CM | POA: Diagnosis present

## 2018-06-22 DIAGNOSIS — G9389 Other specified disorders of brain: Secondary | ICD-10-CM | POA: Diagnosis present

## 2018-06-22 DIAGNOSIS — E785 Hyperlipidemia, unspecified: Secondary | ICD-10-CM | POA: Diagnosis present

## 2018-06-22 DIAGNOSIS — Z955 Presence of coronary angioplasty implant and graft: Secondary | ICD-10-CM | POA: Diagnosis not present

## 2018-06-22 DIAGNOSIS — G4733 Obstructive sleep apnea (adult) (pediatric): Secondary | ICD-10-CM | POA: Diagnosis present

## 2018-06-22 DIAGNOSIS — Z781 Physical restraint status: Secondary | ICD-10-CM | POA: Diagnosis not present

## 2018-06-22 DIAGNOSIS — N183 Chronic kidney disease, stage 3 (moderate): Secondary | ICD-10-CM | POA: Diagnosis present

## 2018-06-22 DIAGNOSIS — Z8673 Personal history of transient ischemic attack (TIA), and cerebral infarction without residual deficits: Secondary | ICD-10-CM | POA: Diagnosis not present

## 2018-06-22 DIAGNOSIS — I447 Left bundle-branch block, unspecified: Secondary | ICD-10-CM | POA: Diagnosis present

## 2018-06-22 DIAGNOSIS — L89623 Pressure ulcer of left heel, stage 3: Secondary | ICD-10-CM | POA: Diagnosis present

## 2018-06-22 DIAGNOSIS — K5730 Diverticulosis of large intestine without perforation or abscess without bleeding: Secondary | ICD-10-CM | POA: Diagnosis present

## 2018-06-22 LAB — GLUCOSE, CAPILLARY
GLUCOSE-CAPILLARY: 147 mg/dL — AB (ref 70–99)
GLUCOSE-CAPILLARY: 106 mg/dL — AB (ref 70–99)
Glucose-Capillary: 93 mg/dL (ref 70–99)
Glucose-Capillary: 148 mg/dL — ABNORMAL HIGH (ref 70–99)

## 2018-06-22 MED ORDER — ACETAMINOPHEN 325 MG PO TABS: 650.0000 mg | ORAL_TABLET | ORAL | Status: DC | PRN

## 2018-06-22 MED ORDER — HYDROCODONE-ACETAMINOPHEN 5-325 MG PO TABS
1.0000 | ORAL_TABLET | ORAL | Status: DC | PRN
  Administered 2018-06-22 – 2018-06-25 (×3): 1 via ORAL
  Filled 2018-06-22 (×4): qty 1

## 2018-06-22 MED ORDER — LEVETIRACETAM 500 MG PO TABS: 500.0000 mg | ORAL_TABLET | Freq: Two times a day (BID) | ORAL | 0 refills | Status: AC

## 2018-06-22 NOTE — Procedures (Signed)
ELECTROENCEPHALOGRAM REPORT   Patient: Mary Cook       Room #: 0O77C EEG No. ID: [id number] Age: 84 y.o.        Sex: female Referring Physician: Karleen Hampshire Report Date:  06/22/2018        Interpreting Physician: Alexis Goodell  History: Mary Cook is an 84 y.o. female with seizure like activity  Medications:  ASA, Coreg, Lanoxin, Lasix, Insulin, Keppra, Remeron, Entresto  Conditions of Recording:  This is a 21 channel routine scalp EEG performed with bipolar and monopolar montages arranged in accordance to the international 10/20 system of electrode placement. One channel was dedicated to EKG recording.  The patient is in the awake state.  Description:  The waking background activity is slow and poorly organized.   It consists of a low voltage mixture of polymorphic delta activity and theta rhythms.  Only rarely are faster frequencies noted.   This slow activity is diffusely distributed and continuous. No epileptiform activity is noted.  The patient does not drowse or sleep. Hyperventilation and intermittent photic stimulation were not performed.   IMPRESSION: This is an abnormal EEG secondary to general background slowing.  This finding may be seen with a diffuse disturbance that is etiologically nonspecific, but may include a metabolic encephalopathy, among other possibilities.  No epileptiform activity was noted.     Alexis Goodell, MD Neurology [phone] 06/22/2018, 6:19 PM

## 2018-06-22 NOTE — Progress Notes (Signed)
PROGRESS NOTE    [name]  HQP:[id number] DOB: November 03, 1925 DOA: 06/19/2018 PCP: Rosita Fire, MD    Brief Narrative:   Mary Cook is a 84 y.o. female with medical history significant for dementia, CVA, CAD status post stent placement in 2011 in 5993, combined systolic and diastolic CHF, OSA on CPAP, recent left hip fracture status post ORIF 2 weeks ago who presented with seizure-like activity.   Assessment & Plan:   Principal Problem:   Seizure-like activity ([place]) Active Problems:   Diabetes mellitus ([place])   Essential hypertension   CAD S/P CFX PCI Sept 2011   OSA on CPAP   Chronic combined systolic and diastolic CHF (congestive heart failure) (HCC)   Chronic kidney disease stage III   Anemia of chronic disease  Seizure-like episode at the memory care unit No seizures since admission.   She was started on Keppra by neurology.  EEG done showed moderate cerebral dysfunction without any epileptiform activity.  She is afebrile with no signs of infection.  Lactic acid within normal limits.  Urinalysis does not show any infection.  CT head shows Chronic ischemic microvascular disease with old right-sided infarcts.  Chest x-ray does not show any disease.  She remains lethargic at this time.  TSH within normal limits serum digoxin level is 0.8.  EKG shows sinus rhythm with incomplete left bundle branch block.  Borderline T wave abnormalities in the lateral leads.   Diabetes mellitus Resume sliding scale insulin at this time. CBG (last 3)  Recent Labs    06/21/18 2112 06/22/18 0623 06/22/18 1111  GLUCAP 147* 93 106*   No changes to meds.    Hypertension  well controlled  resume Coreg   Stage 3 CKD Creatinine at baseline.  Chronic systolic and diastolic heart failure: Euvolemic. Echo from 04/2018  shows Left ventricle: The cavity size was normal. Wall thickness was   normal. Systolic function was moderately to severely reduced. The   estimated ejection  fraction was in the range of 30% to 35%.   Diffuse hypokinesis. There is severe hypokinesis of the   inferolateral and inferior myocardium. Indeterminate diastolic   function.   Stage 3 heel pressure ulcer on the left: Wound care consulted and recommendations given. willg et x rays of the left foot.    Recent left femur fracture: S/p intramedullary nailing of the left hip recently.  Pain control.  Requested orthopedics to see the patient.    DVT prophylaxis: Lovenox Code Status: DNR  family Communication: None at bedside Disposition Plan: SNF when bed available.     Consultants:   Neurology   Procedures: EEG   Antimicrobials: None   Subjective: More lethargic today. Reports pain in the left foot.  Objective: Vitals:   06/22/18 0241 06/22/18 0753 06/22/18 1112 06/22/18 1540  BP: (!) 145/60 (!) 138/46 (!) 151/64 138/79  Pulse: 76 68 74   Resp: 16 16 16    Temp: 98.3 F (36.8 C) 98.7 F (37.1 C)    TempSrc: Axillary Axillary    SpO2:  100% 97% 97%  Weight:      Height:        Intake/Output Summary (Last 24 hours) at 06/22/2018 1704 Last data filed at 06/22/2018 1100 Gross per 24 hour  Intake 315 ml  Output -  Net 315 ml   Filed Weights   06/20/18 0315 06/21/18 0447 06/22/18 0147  Weight: 56.5 kg 56.5 kg 57.2 kg    Examination:  General exam: calm and not  in distress. Not on oxygen.  Respiratory system: DIMINISHED at bases, no wheezing or rhonchi.  Cardiovascular system: S1 & S2 heard, RRR. No JVD, No pedal edema. Gastrointestinal system: Abdomen is soft, non tender non distended bowel sounds good.  Central nervous system: Responding to questions following commands  But more lethargic today.  Extremities: No cyanosis or edema Skin: Stage III left heel pressure ulcer Psychiatry: Unable to assess    Data Reviewed: I have personally reviewed following labs and imaging studies  CBC: Recent Labs  Lab 06/19/18 1038 06/20/18 0003 06/20/18 0505  06/21/18 1110  WBC 7.4 6.0 5.3 5.3  NEUTROABS 5.4  --   --   --   HGB 9.3* 8.2* 7.6* 8.3*  HCT 31.3* 26.7* 24.7* 26.6*  MCV 96.6 93.7 94.3 93.0  PLT 317 300 267 132   Basic Metabolic Panel: Recent Labs  Lab 06/19/18 1038 06/20/18 0003 06/20/18 0505 06/21/18 1110  NA 141  --  141 139  K 4.2  --  3.5 3.6  CL 107  --  109 107  CO2 26  --  24 25  GLUCOSE 96  --  89 103*  BUN 21  --  20 18  CREATININE 1.45* 1.47* 1.43* 1.41*  CALCIUM 9.3  --  8.9 8.9   GFR: Estimated Creatinine Clearance: 23.9 mL/min (A) (by C-G formula based on SCr of 1.41 mg/dL (H)). Liver Function Tests: Recent Labs  Lab 06/19/18 1038 06/20/18 0505  AST 14* 13*  ALT 9 8  ALKPHOS 101 83  BILITOT 0.5 0.5  PROT 6.5 5.5*  ALBUMIN 2.8* 2.4*   No results for input(s): LIPASE, AMYLASE in the last 168 hours. Recent Labs  Lab 06/19/18 1038  AMMONIA 20   Coagulation Profile: No results for input(s): INR, PROTIME in the last 168 hours. Cardiac Enzymes: No results for input(s): CKTOTAL, CKMB, CKMBINDEX, TROPONINI in the last 168 hours. BNP (last 3 results) No results for input(s): PROBNP in the last 8760 hours. HbA1C: No results for input(s): HGBA1C in the last 72 hours. CBG: Recent Labs  Lab 06/21/18 1701 06/21/18 2112 06/22/18 0623 06/22/18 1111  GLUCAP 97 147* 93 106*   Lipid Profile: No results for input(s): CHOL, HDL, LDLCALC, TRIG, CHOLHDL, LDLDIRECT in the last 72 hours. Thyroid Function Tests: Recent Labs    06/20/18 0003  TSH 4.260   Anemia Panel: No results for input(s): VITAMINB12, FOLATE, FERRITIN, TIBC, IRON, RETICCTPCT in the last 72 hours. Sepsis Labs: Recent Labs  Lab 06/19/18 1140  LATICACIDVEN 1.00    No results found for this or any previous visit (from the past 240 hour(s)).       Radiology Studies: No results found.      Scheduled Meds: . aspirin EC  81 mg Oral BID  . carvedilol  3.125 mg Oral Q breakfast  . digoxin  0.125 mg Oral Daily  .  enoxaparin (LOVENOX) injection  30 mg Subcutaneous Daily  . furosemide  20 mg Oral QODAY  . insulin aspart  0-5 Units Subcutaneous QHS  . insulin aspart  0-9 Units Subcutaneous TID WC  . levETIRAcetam  500 mg Oral BID  . mouth rinse  15 mL Mouth Rinse BID  . mirtazapine  15 mg Oral QHS  . sacubitril-valsartan  1 tablet Oral BID   Continuous Infusions:   LOS: 0 days    Time spent: 30 minutes.     Hosie Poisson, MD [facility] Pager [phone]  If 7PM-7AM, please contact night-coverage [email or web address] Password  [email or web address] 06/22/2018, 5:04 PM

## 2018-06-22 NOTE — Progress Notes (Signed)
Physical Therapy Treatment Patient Details Name: Mary Cook MRN: [id number] DOB: 14-Apr-1926 Today's Date: 06/22/2018    History of Present Illness Pt is a 84 y/o female with past medical history significant for recent L hip fx s/p ORIF ([facility]), DM, dementia, stroke (2002), traumatic SAH (12/2016), HTN, cerebral aneurysm, CHF with Diastolic HF who presented to [facility] for AMS and seizure-like activity. CT was negative for any acute findings.    PT Comments    Pt did well during today's session, but still presented with lethargy. She was able to perform transfer training today and participate in sitting balance on EOB. Pt complained of dizziness upon sitting on EOB, BP upon sitting was 174/56 mmHg. While working on sitting balance and reaching across midline, pt presented with a L visual field deficit when reaching for therapist's hand. Plan next session for LE strengthening and transfer training in [place]. Pt continues to make progress toward stated goals and would benefit from continued PT in order to d/c to SNF.    Follow Up Recommendations  SNF     Equipment Recommendations  None recommended by PT    Recommendations for Other Services       Precautions / Restrictions Precautions Precautions: Fall Restrictions Weight Bearing Restrictions: Yes LLE Weight Bearing: Weight bearing as tolerated Other Position/Activity Restrictions: WBAT    Mobility  Bed Mobility Overal bed mobility: Needs Assistance Bed Mobility: Sidelying to Sit;Sit to Supine  Rolling: Max assist   Supine to sit: Max assist;+2 for physical assistance Sit to supine: Total assist;+2 for physical assistance   General bed mobility comments: Pt required assist to move bil LE to eob needed assist to elevate trunk into sitting and used bed pad to position pt in bed. Upon sitting pt reported mild dizziness that subsided shortly after. Required VC/TC to maintain upright posture.  Transfers Overall transfer level:  Needs assistance Equipment used: 2 person hand held assist Transfers: Sit to/from Stand Sit to Stand: +2 physical assistance            Ambulation/Gait                 Stairs             Wheelchair Mobility    Modified Rankin (Stroke Patients Only) Modified Rankin (Stroke Patients Only) Pre-Morbid Rankin Score: Severe disability Modified Rankin: Severe disability     Balance Overall balance assessment: Needs assistance Sitting-balance support: Feet supported Sitting balance-Leahy Scale: Poor Sitting balance - Comments: pt initially requiring mod A to maintain upright sitting balance at EOB, progressing to close min guard with bilateral UE supports for brief periods of time. Sitting balance performed while reaching across midline, pt appeared a L visual field deficit when reaching out to touch theraipst's hand.    Postural control: Right lateral lean;Posterior lean                                  Cognition Arousal/Alertness: Lethargic Behavior During Therapy: Flat affect Overall Cognitive Status: Impaired/Different from baseline Area of Impairment: Attention;Following commands;Safety/judgement;Problem solving                   Current Attention Level: Focused   Following Commands: Follows one step commands inconsistently;Follows one step commands with increased time Safety/Judgement: Decreased awareness of deficits;Decreased awareness of safety   Problem Solving: Slow processing;Decreased initiation;Difficulty sequencing;Requires verbal cues;Requires tactile cues General Comments: Pt presented with congintive deficits  and lethargy.      Exercises      General Comments        Pertinent Vitals/Pain Pain Assessment: Faces Faces Pain Scale: Hurts a little bit Pain Location: L heel  Pain Descriptors / Indicators: Grimacing Pain Intervention(s): Repositioned;Other (comment)(applied posey boots)    Home Living                       Prior Function            PT Goals (current goals can now be found in the care plan section) Acute Rehab PT Goals Patient Stated Goal: none stated PT Goal Formulation: Patient unable to participate in goal setting Time For Goal Achievement: 07/04/18 Potential to Achieve Goals: Fair Progress towards PT goals: Progressing toward goals    Frequency    Min 2X/week      PT Plan Current plan remains appropriate    Co-evaluation              AM-PAC PT "6 Clicks" Daily Activity  Outcome Measure  Difficulty turning over in bed (including adjusting bedclothes, sheets and blankets)?: Unable Difficulty moving from lying on back to sitting on the side of the bed? : Unable Difficulty sitting down on and standing up from a chair with arms (e.g., wheelchair, bedside commode, etc,.)?: Unable Help needed moving to and from a bed to chair (including a wheelchair)?: A Lot Help needed walking in hospital room?: Total Help needed climbing 3-5 steps with a railing? : Total 6 Click Score: 7    End of Session Equipment Utilized During Treatment: Gait belt Activity Tolerance: Patient limited by lethargy;Patient limited by fatigue Patient left: in bed;with call bell/phone within reach;with bed alarm set;Other (comment)(with bil posey boots) Nurse Communication: Mobility status PT Visit Diagnosis: Other abnormalities of gait and mobility (R26.89);Other symptoms and signs involving the nervous system (R29.898)     Time: 9024-0973 PT Time Calculation (min) (ACUTE ONLY): 22 min  Charges:  $Therapeutic Activity: 8-22 mins                     [id number], SPTA   [place] 06/22/2018, 5:49 PM

## 2018-06-22 NOTE — Progress Notes (Signed)
EEG completed; results pending.    

## 2018-06-23 DIAGNOSIS — Z9989 Dependence on other enabling machines and devices: Secondary | ICD-10-CM

## 2018-06-23 DIAGNOSIS — N183 Chronic kidney disease, stage 3 (moderate): Secondary | ICD-10-CM

## 2018-06-23 DIAGNOSIS — E0822 Diabetes mellitus due to underlying condition with diabetic chronic kidney disease: Secondary | ICD-10-CM

## 2018-06-23 DIAGNOSIS — G4733 Obstructive sleep apnea (adult) (pediatric): Secondary | ICD-10-CM

## 2018-06-23 LAB — BASIC METABOLIC PANEL
ANION GAP: 5 (ref 5–15)
BUN: 25 mg/dL — AB (ref 8–23)
CALCIUM: 9.3 mg/dL (ref 8.9–10.3)
CO2: 27 mmol/L (ref 22–32)
Chloride: 111 mmol/L (ref 98–111)
Creatinine, Ser: 1.49 mg/dL — ABNORMAL HIGH (ref 0.44–1.00)
GFR calc Af Amer: 34 mL/min — ABNORMAL LOW (ref >60)
GFR, EST NON AFRICAN AMERICAN: 30 mL/min — AB (ref >60)
GLUCOSE: 142 mg/dL — AB (ref 70–99)
POTASSIUM: 4 mmol/L (ref 3.5–5.1)
Sodium: 143 mmol/L (ref 135–145)

## 2018-06-23 LAB — CBC
HCT: 30.1 % — ABNORMAL LOW (ref 36.0–46.0)
HEMOGLOBIN: 9.1 g/dL — AB (ref 12.0–15.0)
MCH: 28.4 pg (ref 26.0–34.0)
MCHC: 30.2 g/dL (ref 30.0–36.0)
MCV: 94.1 fL (ref 80.0–100.0)
Platelets: 295 10*3/uL (ref 150–400)
RBC: 3.2 MIL/uL — ABNORMAL LOW (ref 3.87–5.11)
RDW: 12.8 % (ref 11.5–15.5)
WBC: 7 10*3/uL (ref 4.0–10.5)
nRBC: 0 % (ref 0.0–0.2)

## 2018-06-23 LAB — SEDIMENTATION RATE: SED RATE: 105 mm/h — AB (ref 0–22)

## 2018-06-23 LAB — GLUCOSE, CAPILLARY
GLUCOSE-CAPILLARY: 113 mg/dL — AB (ref 70–99)
GLUCOSE-CAPILLARY: 121 mg/dL — AB (ref 70–99)
Glucose-Capillary: 131 mg/dL — ABNORMAL HIGH (ref 70–99)

## 2018-06-23 LAB — C-REACTIVE PROTEIN: CRP: 1.5 mg/dL — AB (ref <1.0)

## 2018-06-23 MED ORDER — SODIUM CHLORIDE 0.9 % IV SOLN
2.0000 g | INTRAVENOUS | Status: DC
  Administered 2018-06-24: 2 g via INTRAVENOUS
  Filled 2018-06-23: qty 20

## 2018-06-23 MED ORDER — METRONIDAZOLE IN NACL 5-0.79 MG/ML-% IV SOLN
500.0000 mg | Freq: Three times a day (TID) | INTRAVENOUS | Status: DC
  Administered 2018-06-23 – 2018-06-24 (×4): 500 mg via INTRAVENOUS
  Filled 2018-06-23 (×5): qty 100

## 2018-06-23 NOTE — Progress Notes (Signed)
Occupational Therapy Treatment Patient Details Name: Mary Cook MRN: [id number] DOB: 13-Jul-1926 Today's Date: 06/23/2018    History of present illness Pt is a 85 y/o female with past medical history significant for recent L hip fx s/p ORIF ([facility]), DM, dementia, stroke (2002), traumatic SAH (12/2016), HTN, cerebral aneurysm, CHF with Diastolic HF who presented to [facility] for AMS and seizure-like activity. CT was negative for any acute findings.   OT comments  Pt supine in bed and willing to participate with OT, lethargic.  Patient repositioned in bed and placed bed into chair position, pt tolerated well reporting "it feels good" in regards to sitting up.  Engaged in grooming tasks: was able to wash face with increased time and cueing, but required assist with washing hands. Attempted to fold towel, but required total hand over hand assist.  Poor sustained attention throughout activities. Fatigues easily.  VSS throughout session.  DC plan remains appropriate.  Will continue to follow.      Follow Up Recommendations  SNF    Equipment Recommendations  None recommended by OT    Recommendations for Other Services      Precautions / Restrictions Precautions Precautions: Fall Restrictions Weight Bearing Restrictions: Yes LLE Weight Bearing: Weight bearing as tolerated       Mobility Bed Mobility Overal bed mobility: Needs Assistance             General bed mobility comments: scooting up towards [facility] with total assist, repositioned into chair position in bed and tolerated well  Transfers                 General transfer comment: deferred due to safety    Balance                                           ADL either performed or assessed with clinical judgement   ADL Overall ADL's : Needs assistance/impaired     Grooming: Wash/dry hands;Wash/dry face;Moderate assistance;Bed level(chair position in bed) Grooming Details (indicate cue type and  reason): pt able to wash face given increased time, a little assist to wash hands                                General ADL Comments: pt completed grooming and UB exercises      Vision       Perception     Praxis      Cognition Arousal/Alertness: Lethargic Behavior During Therapy: Flat affect Overall Cognitive Status: Impaired/Different from baseline Area of Impairment: Attention;Following commands;Safety/judgement;Problem solving                   Current Attention Level: Focused   Following Commands: Follows one step commands inconsistently;Follows one step commands with increased time Safety/Judgement: Decreased awareness of deficits;Decreased awareness of safety   Problem Solving: Slow processing;Decreased initiation;Difficulty sequencing;Requires verbal cues;Requires tactile cues          Exercises Exercises: General Upper Extremity General Exercises - Upper Extremity Shoulder Flexion: AAROM;5 reps;Both;Seated(in bed) Elbow Flexion: AAROM;Both;5 reps;Seated(in bed) Digit Composite Flexion: AAROM;5 reps;Both;Seated(in bed) Composite Extension: AAROM;Both;5 reps;Seated(in bed)   Shoulder Instructions       General Comments      Pertinent Vitals/ Pain       Pain Assessment: Faces Faces Pain Scale: No hurt  Home Living  Prior Functioning/Environment              Frequency  Min 2X/week        Progress Toward Goals  OT Goals(current goals can now be found in the care plan section)  Progress towards OT goals: Progressing toward goals  Acute Rehab OT Goals Patient Stated Goal: none stated Time For Goal Achievement: 07/04/18 Potential to Achieve Goals: [place] Discharge plan remains appropriate;Frequency remains appropriate    Co-evaluation                 AM-PAC PT "6 Clicks" Daily Activity     Outcome Measure   Help from another person eating meals?:  Total Help from another person taking care of personal grooming?: A Lot Help from another person toileting, which includes using toliet, bedpan, or urinal?: Total Help from another person bathing (including washing, rinsing, drying)?: Total Help from another person to put on and taking off regular upper body clothing?: Total Help from another person to put on and taking off regular lower body clothing?: Total 6 Click Score: 7    End of Session Equipment Utilized During Treatment: Oxygen  OT Visit Diagnosis: Muscle weakness (generalized) (M62.81);Other symptoms and signs involving the nervous system (R29.898);Other symptoms and signs involving cognitive function   Activity Tolerance Patient limited by fatigue;Patient limited by lethargy   Patient Left in bed;with bed alarm set;with call bell/phone within reach;Other (comment)(SLP in room)   Nurse Communication Mobility status;Other (comment)(refusal to replace oxygen )        Time: 5498-2641 OT Time Calculation (min): 20 min  Charges: OT General Charges $OT Visit: 1 Visit OT Treatments $Self Care/Home Management : 8-22 mins  Delight Stare, [place] Pager [phone] Office [phone]    Delight Stare 06/23/2018, 4:43 PM

## 2018-06-23 NOTE — Progress Notes (Signed)
    Subjective: Patient reports that she is not in pain.    Objective:   VITALS:   Vitals:   06/22/18 2034 06/22/18 2357 06/23/18 0145 06/23/18 0435  BP: (!) 132/39 (!) 141/46  (!) 166/94  Pulse: 72 73  (!) 116  Resp: 16 16  16   Temp: 98.7 F (37.1 C) 99 F (37.2 C)  98.4 F (36.9 C)  TempSrc: Axillary Axillary  Axillary  SpO2:      Weight:   55.6 kg   Height:       CBC Latest Ref Rng & Units 06/21/2018 06/20/2018 06/20/2018  WBC 4.0 - 10.5 K/uL 5.3 5.3 6.0  Hemoglobin 12.0 - 15.0 g/dL 8.3(L) 7.6(L) 8.2(L)  Hematocrit 36.0 - 46.0 % 26.6(L) 24.7(L) 26.7(L)  Platelets 150 - 400 K/uL 270 267 300   BMP Latest Ref Rng & Units 06/21/2018 06/20/2018 06/20/2018  Glucose 70 - 99 mg/dL 103(H) 89 -  BUN 8 - 23 mg/dL 18 20 -  Creatinine 0.44 - 1.00 mg/dL 1.41(H) 1.43(H) 1.47(H)  BUN/Creat Ratio 12 - 28 - - -  Sodium 135 - 145 mmol/L 139 141 -  Potassium 3.5 - 5.1 mmol/L 3.6 3.5 -  Chloride 98 - 111 mmol/L 107 109 -  CO2 22 - 32 mmol/L 25 24 -  Calcium 8.9 - 10.3 mg/dL 8.9 8.9 -   Intake/Output      10/28 0701 - 10/29 0700   P.O. 75   Total Intake(mL/kg) 75 (1.3)   Net +75       Urine Occurrence 2 x   Stool Occurrence 1 x      Physical Exam: General: NAD.  Supine, sleeping in bed on arrival.  Responds appropriately to questions.  No increased work of breathing. MSK RLE: Airboot in place Foot warm Sensation grossly intact distally Wiggles toes on command Incisions healed.  No swelling, tenderness or sign of infection  Assessment / Plan: S/P Left Hip IM Nail for Fracture by Dr. Ernesta Amble. Murphy on 06/02/2018  Principal Problem:   Seizure-like activity ([place]) Active Problems:   Diabetes mellitus ([place])   Essential hypertension   CAD S/P CFX PCI Sept 2011   OSA on CPAP   Chronic combined systolic and diastolic CHF (congestive heart failure) (HCC)   Chronic kidney disease stage III   Anemia of chronic disease   Pressure injury of skin   Seizures  (HCC)   Closed left intertrochanteric femur fracture, 3 weeks status post IM nail Continue to mobilize with therapy Incentive Spirometry Continue care per medical team WBAT Minimize narcotics for pain Patient was switched from lovenox to ASA 81 mg BID during previous admission dt anemia.  Patient does not need to be on both ASA 81 BID and Lovenox for DVT prophylaxis from an orthopedic perspective.  Follow up in the office with Dr. Alain Marion in about 3 weeks.  Please call with questions.    [place] III, PA-C 06/23/2018, 6:17 AM

## 2018-06-23 NOTE — Progress Notes (Signed)
  Speech Language Pathology Treatment: Dysphagia  Patient Details Name: Mary Cook MRN: [id number] DOB: 02-10-1926 Today's Date: 06/23/2018 Time: 3009-2330 SLP Time Calculation (min) (ACUTE ONLY): 13 min  Assessment / Plan / Recommendation Clinical Impression  Pt had mildly slowed bolus formation and A/P transfer with purees, but oral clearance was appropriate given extra time, Min cues, and moderate amounts of encouragement to consume additional bites/sips. Pt declined much PO intake, saying she wasn't hungry, and would not try more solid textures. It is unclear what her baseline diet was, but for now would continue Dys 1 textures and thin liquids.     HPI HPI: 84 yo female adm to [place] with seizure like activity.  Pt is a resident of [facility] and a ward of the state.  She has h/o dementia, s/p ORIF 2 weeks ago, OSA - ? Cpap and she demonstrated AMS- staring off and "foaming at mouth" per MD notes from family report.  Swallow evaluation ordered. CXR negative.  EEG showed mild cerebral dysfunction.        SLP Plan  Continue with current plan of care       Recommendations  Diet recommendations: Dysphagia 1 (puree);Thin liquid Liquids provided via: Cup;Straw Medication Administration: Whole meds with puree(crush PRN) Supervision: Staff to assist with self feeding Compensations: Slow rate;Small sips/bites;Lingual sweep for clearance of pocketing;Follow solids with liquid Postural Changes and/or Swallow Maneuvers: Seated upright 90 degrees                Oral Care Recommendations: Oral care BID Follow up Recommendations: Skilled Nursing facility SLP Visit Diagnosis: Dysphagia, oral phase (R13.11) Plan: Continue with current plan of care       GO                Germain Osgood 06/23/2018, 4:10 PM  Germain Osgood, M.A. [place] Acute Environmental education officer [phone] Office [phone]

## 2018-06-23 NOTE — Progress Notes (Signed)
CSW received call from patient's legal guardian through DSS after hours yesterday that she would like to see if patient could be transferred to [place] to continue her rehab, as it's easier for patient's husband to get to her to visit. CSW sent referral to [facility] in [place], awaiting responses from facilities.   CSW to follow.  Laveda Abbe, [place] Clinical Social Worker [phone]

## 2018-06-23 NOTE — Consult Note (Signed)
   [facility] CM Inpatient Consult   06/23/2018  Mary Cook [facility] [birth year] [id number]  Chart reviewed for referral for [place] Management services.  Patient has [employer]. Patient working with therapy will follow up at more appropriate time

## 2018-06-23 NOTE — Progress Notes (Signed)
CSW following for discharge plan. Per chart review, patient recent readmit, last full assessment completed on 06/04/18 (see below). Patient admitted from [facility] and can return at discharge. CSW contacted legal guardian to confirm, awaiting on call back.  CSW to continue to follow.  Laveda Abbe, [place] Clinical Social Worker [phone]     Clinical Social Work Assessment  Patient Details  Name: Mary Cook MRN: [id number] Date of Birth: 02-09-1926  Date of referral:  06/04/18               Reason for consult:  Facility Placement                 Permission sought to share information with:  Family Supports, Guardian Permission granted to share information::  Yes, Verbal Permission Granted             Name::     sisters Cleo and Braxton::  [facility]             Relationship::  [place] DSS guardian [phone] ex [phone]             Contact Information:     Housing/Transportation Living arrangements for the past 2 months:  [place] of Information:  Patient, Siblings, Medical Team Patient Interpreter Needed:  None Criminal Activity/Legal Involvement Pertinent to Current Situation/Hospitalization:  No - Comment as needed Significant Relationships:  Siblings, Spouse, Community Support Lives with:  Facility Resident Do you feel safe going back to the place where you live?  Yes Need for family participation in patient care:  Yes (Comment)(sisters and DSS )  Care giving concerns:  Pt admitted from [facility] assisted living. Spouse lives there as well.  Pt has a DSS guardian with [facility] as family unable to make care decisions for her and she has Alzheimer's and long term care needs. Pt requires assistance with ambulating and ADLs at baseline. Fluctuating orientation, forgets names, location, not always oriented to situation.  Admitted for femur fracture, fell at facility- post op.    Social Worker  assessment / plan:  CSW consulted to assist with disposition- SNF recommended. Discussed with pt and her sisters- also left voicemail for DSS guardian for input (contact # above). Pt and sisters state that guardian "may want to try to get her into [facility] since our other sister lives there." Pt has been to SNF in the past, hopes to be able to return to [facility] once functioning improves, however sisters express they have considered that pt has been falling more often and care needs may be progressing past ALF in the future. Completed FL2 and made referrals- pt will need [facility] Medicare prior authorization once facility selected by guardian.  Employment status:  Retired Forensic scientist:  Managed Medicare([employer] Medicare (not Dietitian)) PT Recommendations:  [place] / Referral to community resources:  [place]  Patient/Family's Response to care:  Pt and sisters appreciative however are not able to discuss care in detail  Patient/Family's Understanding of and Emotional Response to Diagnosis, Current Treatment, and Prognosis:  Pt's understanding of treatment limited- states she fell and is in the hospital, states, "they want me to get into rehab somewhere." Emotionally very pleasant and gracious with CSW.  Emotional Assessment Appearance:  Appears stated age Attitude/Demeanor/Rapport:  Engaged(disoriented) Affect (typically observed):  Calm Orientation:  Oriented to Self, Oriented to Place Alcohol / Substance use:  Not Applicable Psych involvement (Current and /or in the community):  No (Comment)  Discharge Needs  Concerns to be addressed:  Discharge Planning Concerns, Care Coordination Readmission within the last 30 days:  Yes Current discharge risk:  Dependent with Mobility Barriers to Discharge:  [place], LCSW 06/04/2018, 4:34 PM [phone] coverage for [phone]

## 2018-06-23 NOTE — Progress Notes (Signed)
PROGRESS NOTE    Mary Cook  SHF:[id number] DOB: 1926-03-27 DOA: 06/19/2018 PCP: Rosita Fire, MD    Brief Narrative:   Mary Cook is a 84 y.o. female with medical history significant for dementia, CVA, CAD status post stent placement in 2011 in 5027, combined systolic and diastolic CHF, OSA on CPAP, recent left hip fracture status post ORIF 2 weeks ago who presented with seizure-like activity.  Patient has been seizure-free after she was started on Keppra.  Wound care consulted for left heel ulcer.  X-rays of the left foot on October 28 showed some gas in signs of infection she was started on IV Rocephin and Flagyl.   Assessment & Plan:   Principal Problem:   Seizure-like activity ([place]) Active Problems:   Diabetes mellitus ([place])   Essential hypertension   CAD S/P CFX PCI Sept 2011   OSA on CPAP   Chronic combined systolic and diastolic CHF (congestive heart failure) (HCC)   Chronic kidney disease stage III   Anemia of chronic disease   Pressure injury of skin   Seizures ([place])  Seizure-like episode: No seizures since admission.   She was started on Keppra by neurology.  EEG done showed moderate cerebral dysfunction without any epileptiform activity.  She is afebrile with no signs of infection.  Lactic acid within normal limits.  Urinalysis does not show any infection.  CT head shows Chronic ischemic microvascular disease with old right-sided infarcts.  Chest x-ray does not show any disease.  She remains lethargic at this time.  TSH within normal limits serum digoxin level is 0.8.  EKG shows sinus rhythm with incomplete left bundle branch block.  Borderline T wave abnormalities in the lateral leads.   Diabetes mellitus Resume sliding scale insulin at this time. CBG (last 3)  Recent Labs    06/22/18 2226 06/23/18 0621 06/23/18 1216  GLUCAP 148* 113* 131*   No changes to meds.  CBGs are well controlled at this time.   Hypertension  well controlled  resume Coreg.  No changes in medications   Stage 3 CKD Creatinine at baseline.  Chronic systolic and diastolic heart failure: Euvolemic. Echo from 04/2018  shows Left ventricle: The cavity size was normal. Wall thickness was   normal. Systolic function was moderately to severely reduced. The   estimated ejection fraction was in the range of 30% to 35%.   Diffuse hypokinesis. There is severe hypokinesis of the   inferolateral and inferior myocardium. Indeterminate diastolic   function.   Stage 3 heel pressure ulcer on the left: Wound care consulted and recommendations given.  Patient continues to complain of pain in the left heel so an x-ray of the left foot was obtained, showed  Soft tissue gas seen in subcutaneous tissues overlying posterior calcaneus suggesting infection. No lytic destruction is seen to suggest osteomyelitis.  Started on IV Rocephin and Flagyl.   Recent left femur fracture: S/p intramedullary nailing of the left hip recently.  Pain control.  Appreciate orthopedic recommendations and discontinued Lovenox as she is on aspirin twice daily.  Recommend outpatient follow-up with orthopedics in about 3 weeks.   DVT prophylaxis: Lovenox Code Status: DNR  family Communication: None at bedside discussed with family at bedside on 10/28 Disposition Plan: SNF when bed available.  Possibly in 1 to 2 days    Consultants:   Neurology   orthopedics Dr. Percell Miller   Procedures: EEG   Antimicrobials: None   Subjective: She is a little bit more alert today  but reports some pain in the left foot.  She denies any chest pain shortness of breath, nausea or vomiting  Objective: Vitals:   06/23/18 0145 06/23/18 0435 06/23/18 0723 06/23/18 1142  BP:  (!) 166/94 139/62   Pulse:  (!) 116 95 72  Resp:  16  20  Temp:  98.4 F (36.9 C) 98.3 F (36.8 C) 98.9 F (37.2 C)  TempSrc:  Axillary Axillary Oral  SpO2:   99%   Weight: 55.6 kg     Height:       No intake or output  data in the 24 hours ending 06/23/18 1305 Filed Weights   06/21/18 0447 06/22/18 0147 06/23/18 0145  Weight: 56.5 kg 57.2 kg 55.6 kg    Examination:  General exam: Alert and comfortable not in any distress Respiratory system: Air entry diminished at bedside no wheezing or rhonchi Cardiovascular system: S1 & S2 heard, RRR. No JVD, No pedal edema. Gastrointestinal system: Abdomen is soft, non tender non distended bowel sounds good.  Central nervous system: alert and comfortable. Non focal .  Extremities: No cyanosis or edema Skin: Stage III left heel pressure ulcer Psychiatry: Unable to assess    Data Reviewed: I have personally reviewed following labs and imaging studies  CBC: Recent Labs  Lab 06/19/18 1038 06/20/18 0003 06/20/18 0505 06/21/18 1110 06/23/18 0950  WBC 7.4 6.0 5.3 5.3 7.0  NEUTROABS 5.4  --   --   --   --   HGB 9.3* 8.2* 7.6* 8.3* 9.1*  HCT 31.3* 26.7* 24.7* 26.6* 30.1*  MCV 96.6 93.7 94.3 93.0 94.1  PLT 317 300 267 270 778   Basic Metabolic Panel: Recent Labs  Lab 06/19/18 1038 06/20/18 0003 06/20/18 0505 06/21/18 1110 06/23/18 0950  NA 141  --  141 139 143  K 4.2  --  3.5 3.6 4.0  CL 107  --  109 107 111  CO2 26  --  24 25 27   GLUCOSE 96  --  89 103* 142*  BUN 21  --  20 18 25*  CREATININE 1.45* 1.47* 1.43* 1.41* 1.49*  CALCIUM 9.3  --  8.9 8.9 9.3   GFR: Estimated Creatinine Clearance: 22 mL/min (A) (by C-G formula based on SCr of 1.49 mg/dL (H)). Liver Function Tests: Recent Labs  Lab 06/19/18 1038 06/20/18 0505  AST 14* 13*  ALT 9 8  ALKPHOS 101 83  BILITOT 0.5 0.5  PROT 6.5 5.5*  ALBUMIN 2.8* 2.4*   No results for input(s): LIPASE, AMYLASE in the last 168 hours. Recent Labs  Lab 06/19/18 1038  AMMONIA 20   Coagulation Profile: No results for input(s): INR, PROTIME in the last 168 hours. Cardiac Enzymes: No results for input(s): CKTOTAL, CKMB, CKMBINDEX, TROPONINI in the last 168 hours. BNP (last 3 results) No results  for input(s): PROBNP in the last 8760 hours. HbA1C: No results for input(s): HGBA1C in the last 72 hours. CBG: Recent Labs  Lab 06/22/18 0623 06/22/18 1111 06/22/18 2226 06/23/18 0621 06/23/18 1216  GLUCAP 93 106* 148* 113* 131*   Lipid Profile: No results for input(s): CHOL, HDL, LDLCALC, TRIG, CHOLHDL, LDLDIRECT in the last 72 hours. Thyroid Function Tests: No results for input(s): TSH, T4TOTAL, FREET4, T3FREE, THYROIDAB in the last 72 hours. Anemia Panel: No results for input(s): VITAMINB12, FOLATE, FERRITIN, TIBC, IRON, RETICCTPCT in the last 72 hours. Sepsis Labs: Recent Labs  Lab 06/19/18 1140  LATICACIDVEN 1.00    No results found for this or any previous  visit (from the past 240 hour(s)).       Radiology Studies: Dg Foot Complete Left  Result Date: 06/22/2018 CLINICAL DATA:  Left heel ulcer. EXAM: LEFT FOOT - COMPLETE 3+ VIEW COMPARISON:  None. FINDINGS: Soft tissue gas is seen in the subcutaneous tissues overlying the posterior calcaneus suggesting infection. No lytic destruction is seen to suggest osteomyelitis. Mild posterior calcaneal spurring is noted. No fracture or dislocation is noted. IMPRESSION: Soft tissue gas seen in subcutaneous tissues overlying posterior calcaneus suggesting infection. No lytic destruction is seen to suggest osteomyelitis. Electronically Signed   By: Marijo Conception, M.D.   On: 06/22/2018 19:19        Scheduled Meds: . aspirin EC  81 mg Oral BID  . carvedilol  3.125 mg Oral Q breakfast  . digoxin  0.125 mg Oral Daily  . furosemide  20 mg Oral QODAY  . insulin aspart  0-5 Units Subcutaneous QHS  . insulin aspart  0-9 Units Subcutaneous TID WC  . levETIRAcetam  500 mg Oral BID  . mouth rinse  15 mL Mouth Rinse BID  . mirtazapine  15 mg Oral QHS  . sacubitril-valsartan  1 tablet Oral BID   Continuous Infusions: . [START ON 06/24/2018] cefTRIAXone (ROCEPHIN)  IV    . metronidazole 500 mg (06/23/18 1153)     LOS: 1 day     Time spent: 30 minutes.     Hosie Poisson, MD [facility] Pager [phone]  If 7PM-7AM, please contact night-coverage [email or web address] Password [email or web address] 06/23/2018, 1:05 PM

## 2018-06-24 DIAGNOSIS — L97429 Non-pressure chronic ulcer of left heel and midfoot with unspecified severity: Secondary | ICD-10-CM

## 2018-06-24 LAB — GLUCOSE, CAPILLARY
GLUCOSE-CAPILLARY: 102 mg/dL — AB (ref 70–99)
GLUCOSE-CAPILLARY: 144 mg/dL — AB (ref 70–99)
GLUCOSE-CAPILLARY: 136 mg/dL — AB (ref 70–99)
Glucose-Capillary: 174 mg/dL — ABNORMAL HIGH (ref 70–99)

## 2018-06-24 MED ORDER — AMOXICILLIN-POT CLAVULANATE 500-125 MG PO TABS: 1.0000 | ORAL_TABLET | Freq: Two times a day (BID) | ORAL | 0 refills | Status: DC

## 2018-06-24 MED ORDER — AMOXICILLIN-POT CLAVULANATE 500-125 MG PO TABS
1.0000 | ORAL_TABLET | Freq: Two times a day (BID) | ORAL | Status: DC
  Administered 2018-06-25: 500 mg via ORAL
  Filled 2018-06-24 (×3): qty 1

## 2018-06-24 NOTE — Consult Note (Signed)
   [facility] - Inpatient CM Inpatient Consult   06/24/2018  Shia Delaine [facility] 1926-03-19 [id number]   Follow up:  [place] with inpatient social worker regarding disposition and needs. LCSW confirms patient is a ward of the state through [place].  Patient is currently slated to return to SNF for continued rehab but this time at [facility] [previously named [facility] to be closer to family.  Chart was reviewed and unplanned readmission score remains at extreme high risk.  Progress notes are as follows: Pt is a 84 y/o female with past medical history significant for recent L hip fx s/p ORIF ([facility]), DM, dementia, stroke (2002), traumatic SAH (12/2016), HTN, cerebral aneurysm, CHF with Diastolic HF who presented to [facility] for AMS and seizure-like activity. CT was negative for any acute findings. Pt has Stage 3 pressure ulcer to L heel - Xray. No osteomyelitis in L heel.  Currently, there are no community care management needs noted.  For questions or referrals, please contact:  Natividad Brood, RN BSN [facility] Liaison  [phone] business mobile phone Toll free office [phone]

## 2018-06-24 NOTE — Discharge Summary (Signed)
Physician Discharge Summary  Mary Cook MVH:[id number] DOB: 09/12/1925 DOA: 06/19/2018  PCP: Mary Fire, MD  Admit date: 06/19/2018 Discharge date: 06/24/2018  Admitted From: Home.  Disposition:  Home.   Recommendations for Outpatient Follow-up:  1. Follow up with PCP in 1-2 weeks 2. Please obtain BMP/CBC in one week Please follow up with orthopedics as recommended.   Discharge Condition: stable.  CODE STATUS: DNR.  Diet recommendation: Dysphagia 1 diet with thin liquid, crush meds with puree.   Brief/Interim Summary: Mary Dalporto Shropshireis a 84 y.o.femalewith medical history significant fordementia, CVA, CAD status post stent placement in 2011 in 8413, combined systolic and diastolic CHF, OSA on CPAP, recent left hip fracture status post ORIF 2 weeks ago who presented with seizure-like activity.  Patient has been seizure-free after she was started on Keppra.  Wound care consulted for left heel ulcer.  X-rays of the left foot on October 28 showed some gas in signs of infection she was started on IV Rocephin and Flagyl, transitioned to augmentin on discharge.   Discharge Diagnoses:  Principal Problem:   Seizure-like activity ([place]) Active Problems:   Diabetes mellitus ([place])   Essential hypertension   CAD S/P CFX PCI Sept 2011   OSA on CPAP   Chronic combined systolic and diastolic CHF (congestive heart failure) (HCC)   Chronic kidney disease stage III   Anemia of chronic disease   Pressure injury of skin   Seizures ([place])  Seizure-like episode: No seizures since admission.   She was started on Keppra by neurology.  EEG done showed moderate cerebral dysfunction without any epileptiform activity.  She is afebrile with no signs of infection.  Lactic acid within normal limits.  Urinalysis does not show any infection.  CT head shows Chronic ischemic microvascular disease with old right-sided infarcts.  Chest x-ray does not show any disease.  She remains lethargic at this  time.  TSH within normal limits serum digoxin level is 0.8.  EKG shows sinus rhythm with incomplete left bundle branch block.  Borderline T wave abnormalities in the lateral leads.   Diabetes mellitus Resume sliding scale insulin at this time. CBG (last 3)  RecentLabs(last2labs)       Recent Labs    06/22/18 2226 06/23/18 0621 06/23/18 1216  GLUCAP 148* 113* 131*     No changes to meds.  CBGs are well controlled at this time.   Hypertension  well controlled  resume Coreg.  No changes in medications   Stage 3 CKD Creatinine at baseline.  Chronic systolic and diastolic heart failure: Euvolemic. Echo from 04/2018  shows Left ventricle: The cavity size was normal. Wall thickness was normal. Systolic function was moderately to severely reduced. The estimated ejection fraction was in the range of 30% to 35%. Diffuse hypokinesis. There is severe hypokinesis of the inferolateral and inferior myocardium. Indeterminate diastolic function.   Stage 3 heel pressure ulcer on the left: Wound care consulted and recommendations given.  Patient continues to complain of pain in the left heel so an x-ray of the left foot was obtained, showed  Soft tissue gas seen in subcutaneous tissues overlying posterior calcaneus suggesting infection. No lytic destruction is seen to suggest osteomyelitis.  Started on IV Rocephin and Flagyl transitioned to oral augmentin on discharge.    Recent left femur fracture: S/p intramedullary nailing of the left hip recently.  Pain control.  Appreciate orthopedic recommendations and discontinued Lovenox as she is on aspirin twice daily.  Recommend outpatient follow-up with orthopedics in  about 3 weeks.    Discharge Instructions  Discharge Instructions    Discharge instructions   Complete by:  As directed    Follow up with PCP in one week.  Please follow up with orthopedics as recommended.     Allergies as of 06/24/2018       Reactions   Clopidogrel Bisulfate Nausea And Vomiting   Patient is not aware of this allergy   Lipitor [atorvastatin] Other (See Comments)   Weak legs   Namenda [memantine Hcl]    N/v/d   Lisinopril Cough   Patient is not aware of this allergy      Medication List    STOP taking these medications   pantoprazole 40 MG tablet Commonly known as:  PROTONIX     TAKE these medications   ACCU-CHEK AVIVA PLUS w/Device Kit Use to check blood sugars twice a day Dx. E11.9   acetaminophen 325 MG tablet Commonly known as:  TYLENOL Take 650 mg by mouth 2 (two) times daily.   albuterol 108 (90 Base) MCG/ACT inhaler Commonly known as:  PROVENTIL HFA;VENTOLIN HFA Inhale 2 puffs into the lungs every 6 (six) hours as needed for wheezing or shortness of breath.   amoxicillin-clavulanate 500-125 MG tablet Commonly known as:  AUGMENTIN Take 1 tablet (500 mg total) by mouth 2 (two) times daily. Start taking on:  06/25/2018   aspirin EC 81 MG tablet Take 81 mg by mouth 2 (two) times daily. What changed:  Another medication with the same name was removed. Continue taking this medication, and follow the directions you see here.   carvedilol 3.125 MG tablet Commonly known as:  COREG Take 1 tablet (3.125 mg total) by mouth daily.   digoxin 0.125 MG tablet Commonly known as:  LANOXIN Take 0.5 tablets (0.0625 mg total) by mouth daily. What changed:  how much to take   docusate sodium 100 MG capsule Commonly known as:  COLACE Take 100 mg by mouth at bedtime.   ergocalciferol 50000 units capsule Commonly known as:  VITAMIN D2 Take 50,000 Units by mouth every Wednesday.   famotidine 10 MG tablet Commonly known as:  PEPCID Take 20 mg by mouth daily.   ferrous sulfate 325 (65 FE) MG tablet Take 1 tablet (325 mg total) by mouth daily with breakfast.   furosemide 20 MG tablet Commonly known as:  LASIX Take 1 tablet (20 mg total) by mouth every other day.   glipiZIDE 5 MG  tablet Commonly known as:  GLUCOTROL Take 0.5 tablets (2.5 mg total) by mouth 2 (two) times daily before a meal.   guaiFENesin 600 MG 12 hr tablet Commonly known as:  MUCINEX Take 1 tablet (600 mg total) by mouth 2 (two) times daily.   guaiFENesin-dextromethorphan 100-10 MG/5ML syrup Commonly known as:  ROBITUSSIN DM Take 5 mLs by mouth 4 (four) times daily as needed for cough.   HYDROcodone-acetaminophen 5-325 MG tablet Commonly known as:  NORCO/VICODIN Take 1-2 tablets by mouth every 6 (six) hours as needed for moderate pain.   isosorbide mononitrate 30 MG 24 hr tablet Commonly known as:  IMDUR Take 0.5 tablets (15 mg total) by mouth daily. What changed:    how much to take  when to take this   levETIRAcetam 500 MG tablet Commonly known as:  KEPPRA Take 1 tablet (500 mg total) by mouth 2 (two) times daily.   mirtazapine 15 MG tablet Commonly known as:  REMERON Take 15 mg by mouth at bedtime.   polyethylene  glycol packet Commonly known as:  MIRALAX / GLYCOLAX Take 17 g by mouth daily as needed for mild constipation.   potassium chloride SA 20 MEQ tablet Commonly known as:  K-DUR,KLOR-CON Take 1 tablet (20 mEq total) by mouth every other day.   sacubitril-valsartan 49-51 MG Commonly known as:  ENTRESTO Take 0.5 tablets by mouth 2 (two) times daily.       Contact information for follow-up providers    Mary Fire, MD. Schedule an appointment as soon as possible for a visit in 1 week(s).   Specialty:  Internal Medicine Contact information: [place] [address] [phone]        [facility]. Schedule an appointment as soon as possible for a visit in 1 week(s).   Contact information: [address]     [address] [place] [address] [phone]       [facility] .   Specialty:  Neurology Contact information: [address] [place] [phone]           Contact information for after-discharge care    Destination    [facility] .   Service:  Skilled Nursing Contact information: [place] [phone]                 Allergies  Allergen Reactions  . Clopidogrel Bisulfate Nausea And Vomiting    Patient is not aware of this allergy  . Lipitor [Atorvastatin] Other (See Comments)    Weak legs  . Namenda [Memantine Hcl]     N/v/d  . Lisinopril Cough    Patient is not aware of this allergy    Consultations:  Orthopedics.   Neurology.   Physical therapy.   SLP.    Procedures/Studies: Dg Chest 1 View  Result Date: 06/01/2018 CLINICAL DATA:  Fall tonight with left hip pain.  Hip fracture. EXAM: CHEST  1 VIEW COMPARISON:  Radiograph 05/22/2018.  CT 10/29/2015 FINDINGS: The cardiomediastinal contours are normal. Mild cardiomegaly with aortic atherosclerosis. Pulmonary vasculature is normal. No consolidation, pleural effusion, or pneumothorax. Left lateral sixth rib fracture of uncertain acuity. No acute osseous abnormalities are seen. IMPRESSION: 1. Indeterminate left lateral sixth rib fracture. No pulmonary complication. 2. Mild cardiomegaly with Aortic Atherosclerosis (ICD10-I70.0). Electronically Signed   By: Keith Rake M.D.   On: 06/01/2018 06:30   Dg Chest 2 View  Result Date: 06/19/2018 CLINICAL DATA:  Altered mental status. EXAM: CHEST - 2 VIEW COMPARISON:  Single-view of the chest 06/01/2018. PA and lateral chest 01/22/2017. CT chest 10/29/2015. FINDINGS: The lungs are clear. There is cardiomegaly. Aortic atherosclerosis is noted. No pneumothorax or pleural fluid. No acute or focal bony abnormality. IMPRESSION: No acute disease. Cardiomegaly. Atherosclerosis. Electronically Signed   By: Inge Rise M.D.   On: 06/19/2018 11:23   Ct Head Wo Contrast  Result Date: 06/19/2018 CLINICAL DATA:  Altered mental status. EXAM:  CT HEAD WITHOUT CONTRAST TECHNIQUE: Contiguous axial images were obtained from the base of the skull through the vertex without intravenous contrast. COMPARISON:  06/01/2018 and 03/31/2018 FINDINGS: Brain: Ventricles and cisterns are within normal. There is mild age related atrophic change. There is an old right MCA/watershed territory infarct. There is chronic ischemic microvascular disease. Small old right basal ganglia infarct. No mass, mass effect, shift of midline structures or acute hemorrhage. No acute infarction. Vascular: No hyperdense vessel or unexpected calcification. Skull: Normal. Negative for fracture or focal  lesion. Sinuses/Orbits: Orbits are normal. Paranasal sinuses are well developed with mild opacification over the left sphenoid sinus and floor the left maxillary sinus with significant interval improvement. Mastoid air cells are notable for minimal opacification over the inferior aspect right worse than left without significant change. Other: None. IMPRESSION: No acute findings. Chronic ischemic microvascular disease with old right-sided infarcts as described. Mild age related atrophy. Mild sinus inflammatory change with significant interval improvement. Electronically Signed   By: Marin Olp M.D.   On: 06/19/2018 11:39   Ct Head Wo Contrast  Result Date: 06/01/2018 CLINICAL DATA:  Fall in nursing home striking head. Laceration to eyebrow. EXAM: CT HEAD WITHOUT CONTRAST CT CERVICAL SPINE WITHOUT CONTRAST TECHNIQUE: Multidetector CT imaging of the head and cervical spine was performed following the standard protocol without intravenous contrast. Multiplanar CT image reconstructions of the cervical spine were also generated. COMPARISON:  Head CT 03/31/2018. Head and cervical spine CT 01/22/2017 FINDINGS: CT HEAD FINDINGS Brain: No intracranial hemorrhage. No subdural or extra-axial fluid collection. Generalized atrophy with ex vacuo dilatation of the right lateral ventricle. Multifocal  remote infarcts in the right MCA distribution, remote right basal gangliar infarcts. Remote left cerebellar infarct. The basilar cisterns are patent. Vascular: Atherosclerosis of skullbase vasculature without hyperdense vessel or abnormal calcification. Skull: No fracture or focal lesion. Sinuses/Orbits: New opacification of sphenoid sinus, ethmoid sinuses, and maxillary sinuses with fluid levels. New partial opacification of right mastoid air cells. No visualized fracture. Other: None. CT CERVICAL SPINE FINDINGS Alignment: Trace anterolisthesis of C4 on C5 and C7 on T1, unchanged from prior exam. No traumatic subluxation. Skull base and vertebrae: No acute fracture. Vertebral body heights are maintained. The dens and skull base are intact. Soft tissues and spinal canal: No prevertebral fluid or swelling. No visible canal hematoma. Disc levels: Disc space narrowing and endplate spurring most prominent at C5-C6 and C6-C7. Multilevel facet arthropathy. Upper chest: Biapical pleuroparenchymal scarring. No acute findings. Other: None. IMPRESSION: 1.  No acute intracranial abnormality.  No skull fracture. 2. Unchanged atrophy and multifocal remote ischemia. 3. Near pan sinusitis, new since CT 2 months ago. 4. Degenerative change in the cervical spine without acute fracture. Electronically Signed   By: Keith Rake M.D.   On: 06/01/2018 06:17   Ct Cervical Spine Wo Contrast  Result Date: 06/01/2018 CLINICAL DATA:  Fall in nursing home striking head. Laceration to eyebrow. EXAM: CT HEAD WITHOUT CONTRAST CT CERVICAL SPINE WITHOUT CONTRAST TECHNIQUE: Multidetector CT imaging of the head and cervical spine was performed following the standard protocol without intravenous contrast. Multiplanar CT image reconstructions of the cervical spine were also generated. COMPARISON:  Head CT 03/31/2018. Head and cervical spine CT 01/22/2017 FINDINGS: CT HEAD FINDINGS Brain: No intracranial hemorrhage. No subdural or extra-axial  fluid collection. Generalized atrophy with ex vacuo dilatation of the right lateral ventricle. Multifocal remote infarcts in the right MCA distribution, remote right basal gangliar infarcts. Remote left cerebellar infarct. The basilar cisterns are patent. Vascular: Atherosclerosis of skullbase vasculature without hyperdense vessel or abnormal calcification. Skull: No fracture or focal lesion. Sinuses/Orbits: New opacification of sphenoid sinus, ethmoid sinuses, and maxillary sinuses with fluid levels. New partial opacification of right mastoid air cells. No visualized fracture. Other: None. CT CERVICAL SPINE FINDINGS Alignment: Trace anterolisthesis of C4 on C5 and C7 on T1, unchanged from prior exam. No traumatic subluxation. Skull base and vertebrae: No acute fracture. Vertebral body heights are maintained. The dens and skull base are intact. Soft tissues and spinal canal:  No prevertebral fluid or swelling. No visible canal hematoma. Disc levels: Disc space narrowing and endplate spurring most prominent at C5-C6 and C6-C7. Multilevel facet arthropathy. Upper chest: Biapical pleuroparenchymal scarring. No acute findings. Other: None. IMPRESSION: 1.  No acute intracranial abnormality.  No skull fracture. 2. Unchanged atrophy and multifocal remote ischemia. 3. Near pan sinusitis, new since CT 2 months ago. 4. Degenerative change in the cervical spine without acute fracture. Electronically Signed   By: Keith Rake M.D.   On: 06/01/2018 06:17   Dg Foot Complete Left  Result Date: 06/22/2018 CLINICAL DATA:  Left heel ulcer. EXAM: LEFT FOOT - COMPLETE 3+ VIEW COMPARISON:  None. FINDINGS: Soft tissue gas is seen in the subcutaneous tissues overlying the posterior calcaneus suggesting infection. No lytic destruction is seen to suggest osteomyelitis. Mild posterior calcaneal spurring is noted. No fracture or dislocation is noted. IMPRESSION: Soft tissue gas seen in subcutaneous tissues overlying posterior  calcaneus suggesting infection. No lytic destruction is seen to suggest osteomyelitis. Electronically Signed   By: Marijo Conception, M.D.   On: 06/22/2018 19:19   Dg C-arm 1-60 Min-no Report  Result Date: 06/02/2018 Fluoroscopy was utilized by the requesting physician.  No radiographic interpretation.   Dg Hip Unilat With Pelvis 2-3 Views Left  Result Date: 06/01/2018 CLINICAL DATA:  Fall tonight with left hip pain. EXAM: DG HIP (WITH OR WITHOUT PELVIS) 2-3V LEFT COMPARISON:  None. FINDINGS: Comminuted displaced intertrochanteric left femur fracture with apex lateral and anterior angulation. Femoral head remains seated. No additional fracture of the pelvis. The pubic rami are intact. IMPRESSION: Comminuted, angulated, and displaced intertrochanteric left femur fracture. Electronically Signed   By: Keith Rake M.D.   On: 06/01/2018 06:28   Dg Femur Min 2 Views Left  Result Date: 06/02/2018 CLINICAL DATA:  Internal fixation of left femur. EXAM: LEFT FEMUR 2 VIEWS FLUOROSCOPY TIME:  57 seconds. COMPARISON:  Radiographs of June 01, 2018. FINDINGS: Four intraoperative fluoroscopic images demonstrate the patient be status post intramedullary rod fixation of proximal left femoral fracture. Good alignment of fracture components is noted. IMPRESSION: Status post intramedullary rod fixation of proximal left femoral fracture. Electronically Signed   By: Marijo Conception, M.D.   On: 06/02/2018 14:19        Subjective: No chest pain or sob. Reports pain is controlled.   Discharge Exam: Vitals:   06/24/18 0340 06/24/18 0717  BP: (!) 153/61 (!) 157/55  Pulse: 79   Resp: (!) 21 20  Temp: 98.4 F (36.9 C) 98.9 F (37.2 C)  SpO2: 100% 100%   Vitals:   06/23/18 2322 06/24/18 0310 06/24/18 0340 06/24/18 0717  BP: 138/61  (!) 153/61 (!) 157/55  Pulse: 79  79   Resp: 20  (!) 21 20  Temp: 98.1 F (36.7 C)  98.4 F (36.9 C) 98.9 F (37.2 C)  TempSrc: Axillary  Axillary Axillary  SpO2: 100%   100% 100%  Weight:  58.3 kg    Height:        General: Pt is comfortable , no distress.  Cardiovascular: RRR, S1/S2 +, no rubs, no gallops Respiratory: CTA bilaterally, no wheezing, no rhonchi Abdominal: Soft, NT, ND, bowel sounds + Extremities:stage 3 heel ulcer ,     The results of significant diagnostics from this hospitalization (including imaging, microbiology, ancillary and laboratory) are listed below for reference.     Microbiology: No results found for this or any previous visit (from the past 240 hour(s)).   Labs: BNP (  last 3 results) Recent Labs    05/08/18 1406  BNP 3,244.0*   Basic Metabolic Panel: Recent Labs  Lab 06/19/18 1038 06/20/18 0003 06/20/18 0505 06/21/18 1110 06/23/18 0950  NA 141  --  141 139 143  K 4.2  --  3.5 3.6 4.0  CL 107  --  109 107 111  CO2 26  --  '24 25 27  '$ GLUCOSE 96  --  89 103* 142*  BUN 21  --  20 18 25*  CREATININE 1.45* 1.47* 1.43* 1.41* 1.49*  CALCIUM 9.3  --  8.9 8.9 9.3   Liver Function Tests: Recent Labs  Lab 06/19/18 1038 06/20/18 0505  AST 14* 13*  ALT 9 8  ALKPHOS 101 83  BILITOT 0.5 0.5  PROT 6.5 5.5*  ALBUMIN 2.8* 2.4*   No results for input(s): LIPASE, AMYLASE in the last 168 hours. Recent Labs  Lab 06/19/18 1038  AMMONIA 20   CBC: Recent Labs  Lab 06/19/18 1038 06/20/18 0003 06/20/18 0505 06/21/18 1110 06/23/18 0950  WBC 7.4 6.0 5.3 5.3 7.0  NEUTROABS 5.4  --   --   --   --   HGB 9.3* 8.2* 7.6* 8.3* 9.1*  HCT 31.3* 26.7* 24.7* 26.6* 30.1*  MCV 96.6 93.7 94.3 93.0 94.1  PLT 317 300 267 270 295   Cardiac Enzymes: No results for input(s): CKTOTAL, CKMB, CKMBINDEX, TROPONINI in the last 168 hours. BNP: Invalid input(s): POCBNP CBG: Recent Labs  Lab 06/22/18 2226 06/23/18 0621 06/23/18 1216 06/23/18 1707 06/24/18 0644  GLUCAP 148* 113* 131* 121* 102*   D-Dimer No results for input(s): DDIMER in the last 72 hours. Hgb A1c No results for input(s): HGBA1C in the last 72  hours. Lipid Profile No results for input(s): CHOL, HDL, LDLCALC, TRIG, CHOLHDL, LDLDIRECT in the last 72 hours. Thyroid function studies No results for input(s): TSH, T4TOTAL, T3FREE, THYROIDAB in the last 72 hours.  Invalid input(s): FREET3 Anemia work up No results for input(s): VITAMINB12, FOLATE, FERRITIN, TIBC, IRON, RETICCTPCT in the last 72 hours. Urinalysis    Component Value Date/Time   COLORURINE STRAW (A) 06/19/2018 1325   APPEARANCEUR CLEAR 06/19/2018 1325   LABSPEC 1.009 06/19/2018 1325   PHURINE 6.0 06/19/2018 1325   GLUCOSEU NEGATIVE 06/19/2018 1325   GLUCOSEU NEGATIVE 10/12/2014 1053   HGBUR SMALL (A) 06/19/2018 1325   BILIRUBINUR NEGATIVE 06/19/2018 1325   KETONESUR NEGATIVE 06/19/2018 1325   PROTEINUR NEGATIVE 06/19/2018 1325   UROBILINOGEN 1.0 04/28/2015 1658   NITRITE NEGATIVE 06/19/2018 1325   LEUKOCYTESUR NEGATIVE 06/19/2018 1325   Sepsis Labs Invalid input(s): PROCALCITONIN,  WBC,  LACTICIDVEN Microbiology No results found for this or any previous visit (from the past 240 hour(s)).   Time coordinating discharge: 35 minutes  SIGNED:   Hosie Poisson, MD  [facility] 06/24/2018, 11:25 AM Pager   If 7PM-7AM, please contact night-coverage [email or web address] Password [email or web address]

## 2018-06-24 NOTE — Progress Notes (Signed)
  Speech Language Pathology Treatment: Dysphagia  Patient Details Name: Mary Cook MRN: [id number] DOB: 14-May-1926 Today's Date: 06/24/2018 Time: 1205-1217 SLP Time Calculation (min) (ACUTE ONLY): 12 min  Assessment / Plan / Recommendation Clinical Impression  Per RN, pt has been very sleepy today- Some question if pt received pain medication that may contribute to lethargy.  Spouse and sister present and inquiring who was going to feed the patient - clearly concerned for nutrition.    SLP provided verbal, tactile stimulation*including sternal rub* with pt opening her eyes minimally and nodding back off. Cold washcloth applied to forehead face to imrpove alertness level without effectiveness.  Gustatory and tactile stimulation with magic cup flavor placed at pt's lip however she did not open mouth to accept intake.  SLP educated people in room to importance NOT to feed pt if lethargic - spouse advised he understood but continued to state he was frustrated.     Recommend consider palliative referral at SNF due to pt's continued difficulty with nutrition and dementia.    HPI HPI: 84 [place] with seizure like activity.  Pt is a resident of [facility] and a ward of the state.  She has h/o dementia, s/p ORIF 2 weeks ago, OSA - ? Cpap and she demonstrated AMS- staring off and "foaming at mouth" per MD notes from family report.  Swallow evaluation ordered. CXR negative.  EEG showed mild cerebral dysfunction.        SLP Plan  Continue with current plan of care       Recommendations  Diet recommendations: Dysphagia 1 (puree);Thin liquid Liquids provided via: Cup;Straw Medication Administration: Whole meds with puree(crush PRN) Supervision: Staff to assist with self feeding Compensations: Slow rate;Small sips/bites;Lingual sweep for clearance of pocketing;Follow solids with liquid Postural Changes and/or Swallow Maneuvers: Seated upright 90 degrees                Oral Care Recommendations: Oral care BID Follow up Recommendations: Skilled Nursing facility SLP Visit Diagnosis: Dysphagia, oral phase (R13.11) Plan: Continue with current plan of care       GO                Macario Golds 06/24/2018, 12:22 PM Luanna Salk, [facility] SLP Acute Rehab Services Pager [phone] Office [phone]

## 2018-06-24 NOTE — Progress Notes (Signed)
Physical Therapy Treatment Patient Details Name: Mary Cook MRN: [id number] DOB: 06-13-1926 Today's Date: 06/24/2018    History of Present Illness Pt is a 84 y/o female with past medical history significant for recent L hip fx s/p ORIF ([facility]), DM, dementia, stroke (2002), traumatic SAH (12/2016), HTN, cerebral aneurysm, CHF with Diastolic HF who presented to [facility] for AMS and seizure-like activity. CT was negative for any acute findings. Pt has Stage 3 pressure ulcer to L heel - Xray. No osteomyelitis in L heel.    PT Comments    Upon arrival nurse communicated with therapist and stated that the pt hadn't woke up this am for her medications or breakfast. It took therapist multiple attempts to wake pt. Once awake, pt was sat up on EOB. Pt tolerated sitting balance well, but continually communicated that she wanted to lay back down and "was cold". The nurse was called to give pt medications while sitting upright on EOB. Before laying pt back down, therapist noticed pt had urinary and bowel movement. At the end of tx, pt was left in bed with 4 blankets, but pt still stated she was cold.Next session plan to strengthen LE and use Stedy to progress to standing. Pt continues to progress toward goals and would benefit from continued PT in order to be d/c to SNF.   Follow Up Recommendations  SNF     Equipment Recommendations  None recommended by PT    Recommendations for Other Services       Precautions / Restrictions Precautions Precautions: Fall Restrictions Weight Bearing Restrictions: Yes LLE Weight Bearing: Weight bearing as tolerated    Mobility  Bed Mobility Overal bed mobility: Needs Assistance Bed Mobility: Sidelying to Sit;Sit to Supine;Rolling Rolling: Max assist Sidelying to sit: Total assist;+2 for physical assistance   Sit to supine: Total assist;+2 for physical assistance   General bed mobility comments: scooting up towards [facility] with total assist, repositioned in  bed at end of treatment. Pt required assist to advance LE off EOB. Once on EOB pt had a L lateral/posterior lean, therapist sat bedside to aide in more upright posture and sitting balance. Pt was able to participate in rolling to sidelying this session requiring Mod A and then able to hold her self in sidelying while therapsit cleaned urinary and bowel incontience.   Transfers Overall transfer level: Needs assistance                  Ambulation/Gait                 Stairs             Wheelchair Mobility    Modified Rankin (Stroke Patients Only) Modified Rankin (Stroke Patients Only) Pre-Morbid Rankin Score: Severe disability Modified Rankin: Severe disability     Balance Overall balance assessment: Needs assistance Sitting-balance support: Feet supported;Bilateral upper extremity supported Sitting balance-Leahy Scale: Poor Sitting balance - Comments: pt initially requiring mod A to maintain upright sitting balance for 20 min at EOB, progressing to close min guard with bilateral UE support for brief periods of time.  Nurse gave pt medications while in sitting during session Postural control: Right lateral lean;Posterior lean                                  Cognition Arousal/Alertness: Lethargic Behavior During Therapy: Flat affect Overall Cognitive Status: Impaired/Different from baseline Area of Impairment: Attention;Following commands;Safety/judgement;Problem solving  Current Attention Level: Focused   Following Commands: Follows one step commands inconsistently;Follows one step commands with increased time Safety/Judgement: Decreased awareness of deficits;Decreased awareness of safety   Problem Solving: Slow processing;Decreased initiation;Difficulty sequencing;Requires verbal cues;Requires tactile cues General Comments: Pt presented with congintive deficits and lethargy.      Exercises Total Joint  Exercises Long Arc Quad: PROM;10 reps;Both;Seated;Other (comment)(ROM limited) Marching in Standing: PROM;Seated;5 reps;Both    General Comments        Pertinent Vitals/Pain Pain Assessment: Faces Faces Pain Scale: No hurt    Home Living                      Prior Function            PT Goals (current goals can now be found in the care plan section) Acute Rehab PT Goals Patient Stated Goal: none stated PT Goal Formulation: Patient unable to participate in goal setting Time For Goal Achievement: 07/04/18 Potential to Achieve Goals: Fair Progress towards PT goals: Progressing toward goals    Frequency    Min 2X/week      PT Plan Current plan remains appropriate    Co-evaluation              AM-PAC PT "6 Clicks" Daily Activity  Outcome Measure  Difficulty turning over in bed (including adjusting bedclothes, sheets and blankets)?: Unable Difficulty moving from lying on back to sitting on the side of the bed? : Unable Difficulty sitting down on and standing up from a chair with arms (e.g., wheelchair, bedside commode, etc,.)?: Unable Help needed moving to and from a bed to chair (including a wheelchair)?: Total Help needed walking in hospital room?: Total Help needed climbing 3-5 steps with a railing? : Total 6 Click Score: 6    End of Session   Activity Tolerance: Patient limited by lethargy;Patient limited by fatigue Patient left: in bed;with call bell/phone within reach;with bed alarm set;Other (comment) Nurse Communication: Other (comment)(Pt status in bed. ) PT Visit Diagnosis: Other abnormalities of gait and mobility (R26.89);Other symptoms and signs involving the nervous system (R29.898)     Time: 8338-2505 PT Time Calculation (min) (ACUTE ONLY): 42 min  Charges:  $Therapeutic Activity: 38-52 mins                     [address] SPTA    [place] 06/24/2018, 2:24 PM

## 2018-06-25 DIAGNOSIS — I10 Essential (primary) hypertension: Secondary | ICD-10-CM | POA: Diagnosis not present

## 2018-06-25 DIAGNOSIS — I5042 Chronic combined systolic (congestive) and diastolic (congestive) heart failure: Secondary | ICD-10-CM | POA: Diagnosis not present

## 2018-06-25 DIAGNOSIS — S72002D Fracture of unspecified part of neck of left femur, subsequent encounter for closed fracture with routine healing: Secondary | ICD-10-CM | POA: Diagnosis not present

## 2018-06-25 DIAGNOSIS — E1122 Type 2 diabetes mellitus with diabetic chronic kidney disease: Secondary | ICD-10-CM | POA: Diagnosis not present

## 2018-06-25 DIAGNOSIS — Z7401 Bed confinement status: Secondary | ICD-10-CM | POA: Diagnosis not present

## 2018-06-25 DIAGNOSIS — E0822 Diabetes mellitus due to underlying condition with diabetic chronic kidney disease: Secondary | ICD-10-CM | POA: Diagnosis not present

## 2018-06-25 DIAGNOSIS — Z9861 Coronary angioplasty status: Secondary | ICD-10-CM | POA: Diagnosis not present

## 2018-06-25 DIAGNOSIS — E568 Deficiency of other vitamins: Secondary | ICD-10-CM | POA: Diagnosis not present

## 2018-06-25 DIAGNOSIS — G4089 Other seizures: Secondary | ICD-10-CM | POA: Diagnosis not present

## 2018-06-25 DIAGNOSIS — I959 Hypotension, unspecified: Secondary | ICD-10-CM | POA: Diagnosis not present

## 2018-06-25 DIAGNOSIS — S72142D Displaced intertrochanteric fracture of left femur, subsequent encounter for closed fracture with routine healing: Secondary | ICD-10-CM | POA: Diagnosis not present

## 2018-06-25 DIAGNOSIS — R569 Unspecified convulsions: Secondary | ICD-10-CM | POA: Diagnosis not present

## 2018-06-25 DIAGNOSIS — D638 Anemia in other chronic diseases classified elsewhere: Secondary | ICD-10-CM | POA: Diagnosis not present

## 2018-06-25 DIAGNOSIS — M2550 Pain in unspecified joint: Secondary | ICD-10-CM | POA: Diagnosis not present

## 2018-06-25 DIAGNOSIS — D649 Anemia, unspecified: Secondary | ICD-10-CM | POA: Diagnosis not present

## 2018-06-25 DIAGNOSIS — G308 Other Alzheimer's disease: Secondary | ICD-10-CM | POA: Diagnosis not present

## 2018-06-25 DIAGNOSIS — R4182 Altered mental status, unspecified: Secondary | ICD-10-CM | POA: Diagnosis not present

## 2018-06-25 DIAGNOSIS — M1990 Unspecified osteoarthritis, unspecified site: Secondary | ICD-10-CM | POA: Diagnosis not present

## 2018-06-25 DIAGNOSIS — I2510 Atherosclerotic heart disease of native coronary artery without angina pectoris: Secondary | ICD-10-CM | POA: Diagnosis not present

## 2018-06-25 DIAGNOSIS — N183 Chronic kidney disease, stage 3 (moderate): Secondary | ICD-10-CM | POA: Diagnosis not present

## 2018-06-25 DIAGNOSIS — L89620 Pressure ulcer of left heel, unstageable: Secondary | ICD-10-CM | POA: Diagnosis not present

## 2018-06-25 DIAGNOSIS — L89623 Pressure ulcer of left heel, stage 3: Secondary | ICD-10-CM | POA: Diagnosis not present

## 2018-06-25 DIAGNOSIS — M8580 Other specified disorders of bone density and structure, unspecified site: Secondary | ICD-10-CM | POA: Diagnosis not present

## 2018-06-25 DIAGNOSIS — I130 Hypertensive heart and chronic kidney disease with heart failure and stage 1 through stage 4 chronic kidney disease, or unspecified chronic kidney disease: Secondary | ICD-10-CM | POA: Diagnosis not present

## 2018-06-25 LAB — GLUCOSE, CAPILLARY
GLUCOSE-CAPILLARY: 85 mg/dL (ref 70–99)
GLUCOSE-CAPILLARY: 168 mg/dL — AB (ref 70–99)
Glucose-Capillary: 117 mg/dL — ABNORMAL HIGH (ref 70–99)
Glucose-Capillary: 182 mg/dL — ABNORMAL HIGH (ref 70–99)
Glucose-Capillary: 46 mg/dL — ABNORMAL LOW (ref 70–99)
Glucose-Capillary: 58 mg/dL — ABNORMAL LOW (ref 70–99)
Glucose-Capillary: 63 mg/dL — ABNORMAL LOW (ref 70–99)

## 2018-06-25 NOTE — Discharge Summary (Signed)
Physician Discharge Summary  Mary Cook OZH:[id number] DOB: 1926/07/17 DOA: 06/19/2018  PCP: Rosita Fire, MD  Admit date: 06/19/2018 Discharge date: 06/25/2018  Admitted From: SNF Disposition: SNF  Recommendations for Outpatient Follow-up:  1. Follow up with PCP in 1-2 weeks 2. Please obtain BMP/CBC in one week Please follow up with orthopedics as recommended in 3 weeks.  Please follow up with Neurology in 4 weeks.  Recommend palliative care consult at SNF for decreased po intake and dementia.   Discharge Condition: stable.  CODE STATUS: DNR.  Diet recommendation: Dysphagia 1 diet with thin liquid, crush meds with puree.   Brief/Interim Summary: Laterra Lubinski Shropshireis a 84 y.o.femalewith medical history significant fordementia, CVA, CAD status post stent placement in 2011 in 6295, combined systolic and diastolic CHF, OSA on CPAP, recent left hip fracture status post ORIF 2 weeks ago who presented with seizure-like activity.  Patient has been seizure-free after she was started on Keppra.  Wound care consulted for left heel ulcer.  X-rays of the left foot on October 28 showed some gas in signs of infection she was started on IV Rocephin and Flagyl, transitioned to augmentin on discharge.   Discharge Diagnoses:  Principal Problem:   Seizure-like activity ([place]) Active Problems:   Diabetes mellitus ([place])   Essential hypertension   CAD S/P CFX PCI Sept 2011   OSA on CPAP   Chronic combined systolic and diastolic CHF (congestive heart failure) (HCC)   Chronic kidney disease stage III   Anemia of chronic disease   Pressure injury of skin   Seizures ([place])  Seizure-like episode: No seizures since admission.   She was started on Keppra by neurology.  EEG done showed moderate cerebral dysfunction without any epileptiform activity.  She is afebrile with no signs of infection.  Lactic acid within normal limits.  Urinalysis does not show any infection.  CT head shows Chronic  ischemic microvascular disease with old right-sided infarcts.  Chest x-ray does not show any disease.  She remains lethargic at this time.  TSH within normal limits serum digoxin level is 0.8.  EKG shows sinus rhythm with incomplete left bundle branch block.  Borderline T wave abnormalities in the lateral leads.   Diabetes mellitus CBG (last 3)  Recent Labs    06/24/18 2114 06/25/18 0703 06/25/18 1115  GLUCAP 136* 117* 182*   Resume home meds on discharge.    Hypertension  well controlled  resume Coreg.  No changes in medications   Stage 3 CKD Creatinine at baseline.  Chronic systolic and diastolic heart failure: Euvolemic. Echo from 04/2018  shows Left ventricle: The cavity size was normal. Wall thickness was normal. Systolic function was moderately to severely reduced. The estimated ejection fraction was in the range of 30% to 35%. Diffuse hypokinesis. There is severe hypokinesis of the inferolateral and inferior myocardium. Indeterminate diastolic function.   Stage 3 heel pressure ulcer on the left: Wound care consulted and recommendations given.  Patient continues to complain of pain in the left heel so an x-ray of the left foot was obtained, showed  Soft tissue gas seen in subcutaneous tissues overlying posterior calcaneus suggesting infection. No lytic destruction is seen to suggest osteomyelitis.  Started on IV Rocephin and Flagyl transitioned to oral augmentin on discharge.    Recent left femur fracture: S/p intramedullary nailing of the left hip recently.  Pain control.  Appreciate orthopedic recommendations and discontinued Lovenox as she is on aspirin twice daily.  Recommend outpatient follow-up with orthopedics in  about 3 weeks.    Discharge Instructions  Discharge Instructions    Discharge instructions   Complete by:  As directed    Follow up with PCP in one week.  Please follow up with orthopedics as recommended.     Allergies  as of 06/25/2018      Reactions   Clopidogrel Bisulfate Nausea And Vomiting   Patient is not aware of this allergy   Lipitor [atorvastatin] Other (See Comments)   Weak legs   Namenda [memantine Hcl]    N/v/d   Lisinopril Cough   Patient is not aware of this allergy      Medication List    STOP taking these medications   pantoprazole 40 MG tablet Commonly known as:  PROTONIX     TAKE these medications   ACCU-CHEK AVIVA PLUS w/Device Kit Use to check blood sugars twice a day Dx. E11.9   acetaminophen 325 MG tablet Commonly known as:  TYLENOL Take 650 mg by mouth 2 (two) times daily.   albuterol 108 (90 Base) MCG/ACT inhaler Commonly known as:  PROVENTIL HFA;VENTOLIN HFA Inhale 2 puffs into the lungs every 6 (six) hours as needed for wheezing or shortness of breath.   amoxicillin-clavulanate 500-125 MG tablet Commonly known as:  AUGMENTIN Take 1 tablet (500 mg total) by mouth 2 (two) times daily.   aspirin EC 81 MG tablet Take 81 mg by mouth 2 (two) times daily. What changed:  Another medication with the same name was removed. Continue taking this medication, and follow the directions you see here.   carvedilol 3.125 MG tablet Commonly known as:  COREG Take 1 tablet (3.125 mg total) by mouth daily.   digoxin 0.125 MG tablet Commonly known as:  LANOXIN Take 0.5 tablets (0.0625 mg total) by mouth daily. What changed:  how much to take   docusate sodium 100 MG capsule Commonly known as:  COLACE Take 100 mg by mouth at bedtime.   ergocalciferol 50000 units capsule Commonly known as:  VITAMIN D2 Take 50,000 Units by mouth every Wednesday.   famotidine 10 MG tablet Commonly known as:  PEPCID Take 20 mg by mouth daily.   ferrous sulfate 325 (65 FE) MG tablet Take 1 tablet (325 mg total) by mouth daily with breakfast.   furosemide 20 MG tablet Commonly known as:  LASIX Take 1 tablet (20 mg total) by mouth every other day.   glipiZIDE 5 MG tablet Commonly known  as:  GLUCOTROL Take 0.5 tablets (2.5 mg total) by mouth 2 (two) times daily before a meal.   guaiFENesin 600 MG 12 hr tablet Commonly known as:  MUCINEX Take 1 tablet (600 mg total) by mouth 2 (two) times daily.   guaiFENesin-dextromethorphan 100-10 MG/5ML syrup Commonly known as:  ROBITUSSIN DM Take 5 mLs by mouth 4 (four) times daily as needed for cough.   HYDROcodone-acetaminophen 5-325 MG tablet Commonly known as:  NORCO/VICODIN Take 1-2 tablets by mouth every 6 (six) hours as needed for moderate pain.   isosorbide mononitrate 30 MG 24 hr tablet Commonly known as:  IMDUR Take 0.5 tablets (15 mg total) by mouth daily. What changed:    how much to take  when to take this   levETIRAcetam 500 MG tablet Commonly known as:  KEPPRA Take 1 tablet (500 mg total) by mouth 2 (two) times daily.   mirtazapine 15 MG tablet Commonly known as:  REMERON Take 15 mg by mouth at bedtime.   polyethylene glycol packet Commonly known as:  MIRALAX / GLYCOLAX Take 17 g by mouth daily as needed for mild constipation.   potassium chloride SA 20 MEQ tablet Commonly known as:  K-DUR,KLOR-CON Take 1 tablet (20 mEq total) by mouth every other day.   sacubitril-valsartan 49-51 MG Commonly known as:  ENTRESTO Take 0.5 tablets by mouth 2 (two) times daily.       Contact information for follow-up providers    Rosita Fire, MD. Schedule an appointment as soon as possible for a visit in 1 week(s).   Specialty:  Internal Medicine Contact information: [place] [address] [phone]        [facility]. Schedule an appointment as soon as possible for a visit in 1 week(s).   Contact information: [address]     [address] [place] [address] [phone]       [facility] .   Specialty:  Neurology Contact information: [address] [place] [phone]            Contact information for after-discharge care    Destination    [facility] .   Service:  Skilled Nursing Contact information: [place] [phone]                 Allergies  Allergen Reactions  . Clopidogrel Bisulfate Nausea And Vomiting    Patient is not aware of this allergy  . Lipitor [Atorvastatin] Other (See Comments)    Weak legs  . Namenda [Memantine Hcl]     N/v/d  . Lisinopril Cough    Patient is not aware of this allergy    Consultations:  Orthopedics.   Neurology.   Physical therapy.   SLP.    Procedures/Studies: Dg Chest 1 View  Result Date: 06/01/2018 CLINICAL DATA:  Fall tonight with left hip pain.  Hip fracture. EXAM: CHEST  1 VIEW COMPARISON:  Radiograph 05/22/2018.  CT 10/29/2015 FINDINGS: The cardiomediastinal contours are normal. Mild cardiomegaly with aortic atherosclerosis. Pulmonary vasculature is normal. No consolidation, pleural effusion, or pneumothorax. Left lateral sixth rib fracture of uncertain acuity. No acute osseous abnormalities are seen. IMPRESSION: 1. Indeterminate left lateral sixth rib fracture. No pulmonary complication. 2. Mild cardiomegaly with Aortic Atherosclerosis (ICD10-I70.0). Electronically Signed   By: Keith Rake M.D.   On: 06/01/2018 06:30   Dg Chest 2 View  Result Date: 06/19/2018 CLINICAL DATA:  Altered mental status. EXAM: CHEST - 2 VIEW COMPARISON:  Single-view of the chest 06/01/2018. PA and lateral chest 01/22/2017. CT chest 10/29/2015. FINDINGS: The lungs are clear. There is cardiomegaly. Aortic atherosclerosis is noted. No pneumothorax or pleural fluid. No acute or focal bony abnormality. IMPRESSION: No acute disease. Cardiomegaly. Atherosclerosis. Electronically Signed   By: Inge Rise M.D.   On: 06/19/2018 11:23   Ct Head Wo Contrast  Result Date: 06/19/2018 CLINICAL DATA:  Altered mental status. EXAM: CT HEAD WITHOUT CONTRAST  TECHNIQUE: Contiguous axial images were obtained from the base of the skull through the vertex without intravenous contrast. COMPARISON:  06/01/2018 and 03/31/2018 FINDINGS: Brain: Ventricles and cisterns are within normal. There is mild age related atrophic change. There is an old right MCA/watershed territory infarct. There is chronic ischemic microvascular disease. Small old right basal ganglia infarct. No mass, mass effect, shift of midline structures or acute hemorrhage. No acute infarction. Vascular: No hyperdense vessel or unexpected calcification. Skull: Normal. Negative for fracture or focal lesion. Sinuses/Orbits: Orbits are normal. Paranasal  sinuses are well developed with mild opacification over the left sphenoid sinus and floor the left maxillary sinus with significant interval improvement. Mastoid air cells are notable for minimal opacification over the inferior aspect right worse than left without significant change. Other: None. IMPRESSION: No acute findings. Chronic ischemic microvascular disease with old right-sided infarcts as described. Mild age related atrophy. Mild sinus inflammatory change with significant interval improvement. Electronically Signed   By: Marin Olp M.D.   On: 06/19/2018 11:39   Ct Head Wo Contrast  Result Date: 06/01/2018 CLINICAL DATA:  Fall in nursing home striking head. Laceration to eyebrow. EXAM: CT HEAD WITHOUT CONTRAST CT CERVICAL SPINE WITHOUT CONTRAST TECHNIQUE: Multidetector CT imaging of the head and cervical spine was performed following the standard protocol without intravenous contrast. Multiplanar CT image reconstructions of the cervical spine were also generated. COMPARISON:  Head CT 03/31/2018. Head and cervical spine CT 01/22/2017 FINDINGS: CT HEAD FINDINGS Brain: No intracranial hemorrhage. No subdural or extra-axial fluid collection. Generalized atrophy with ex vacuo dilatation of the right lateral ventricle. Multifocal remote infarcts in the right  MCA distribution, remote right basal gangliar infarcts. Remote left cerebellar infarct. The basilar cisterns are patent. Vascular: Atherosclerosis of skullbase vasculature without hyperdense vessel or abnormal calcification. Skull: No fracture or focal lesion. Sinuses/Orbits: New opacification of sphenoid sinus, ethmoid sinuses, and maxillary sinuses with fluid levels. New partial opacification of right mastoid air cells. No visualized fracture. Other: None. CT CERVICAL SPINE FINDINGS Alignment: Trace anterolisthesis of C4 on C5 and C7 on T1, unchanged from prior exam. No traumatic subluxation. Skull base and vertebrae: No acute fracture. Vertebral body heights are maintained. The dens and skull base are intact. Soft tissues and spinal canal: No prevertebral fluid or swelling. No visible canal hematoma. Disc levels: Disc space narrowing and endplate spurring most prominent at C5-C6 and C6-C7. Multilevel facet arthropathy. Upper chest: Biapical pleuroparenchymal scarring. No acute findings. Other: None. IMPRESSION: 1.  No acute intracranial abnormality.  No skull fracture. 2. Unchanged atrophy and multifocal remote ischemia. 3. Near pan sinusitis, new since CT 2 months ago. 4. Degenerative change in the cervical spine without acute fracture. Electronically Signed   By: Keith Rake M.D.   On: 06/01/2018 06:17   Ct Cervical Spine Wo Contrast  Result Date: 06/01/2018 CLINICAL DATA:  Fall in nursing home striking head. Laceration to eyebrow. EXAM: CT HEAD WITHOUT CONTRAST CT CERVICAL SPINE WITHOUT CONTRAST TECHNIQUE: Multidetector CT imaging of the head and cervical spine was performed following the standard protocol without intravenous contrast. Multiplanar CT image reconstructions of the cervical spine were also generated. COMPARISON:  Head CT 03/31/2018. Head and cervical spine CT 01/22/2017 FINDINGS: CT HEAD FINDINGS Brain: No intracranial hemorrhage. No subdural or extra-axial fluid collection. Generalized  atrophy with ex vacuo dilatation of the right lateral ventricle. Multifocal remote infarcts in the right MCA distribution, remote right basal gangliar infarcts. Remote left cerebellar infarct. The basilar cisterns are patent. Vascular: Atherosclerosis of skullbase vasculature without hyperdense vessel or abnormal calcification. Skull: No fracture or focal lesion. Sinuses/Orbits: New opacification of sphenoid sinus, ethmoid sinuses, and maxillary sinuses with fluid levels. New partial opacification of right mastoid air cells. No visualized fracture. Other: None. CT CERVICAL SPINE FINDINGS Alignment: Trace anterolisthesis of C4 on C5 and C7 on T1, unchanged from prior exam. No traumatic subluxation. Skull base and vertebrae: No acute fracture. Vertebral body heights are maintained. The dens and skull base are intact. Soft tissues and spinal canal: No prevertebral fluid or swelling. No  visible canal hematoma. Disc levels: Disc space narrowing and endplate spurring most prominent at C5-C6 and C6-C7. Multilevel facet arthropathy. Upper chest: Biapical pleuroparenchymal scarring. No acute findings. Other: None. IMPRESSION: 1.  No acute intracranial abnormality.  No skull fracture. 2. Unchanged atrophy and multifocal remote ischemia. 3. Near pan sinusitis, new since CT 2 months ago. 4. Degenerative change in the cervical spine without acute fracture. Electronically Signed   By: Keith Rake M.D.   On: 06/01/2018 06:17   Dg Foot Complete Left  Result Date: 06/22/2018 CLINICAL DATA:  Left heel ulcer. EXAM: LEFT FOOT - COMPLETE 3+ VIEW COMPARISON:  None. FINDINGS: Soft tissue gas is seen in the subcutaneous tissues overlying the posterior calcaneus suggesting infection. No lytic destruction is seen to suggest osteomyelitis. Mild posterior calcaneal spurring is noted. No fracture or dislocation is noted. IMPRESSION: Soft tissue gas seen in subcutaneous tissues overlying posterior calcaneus suggesting infection. No  lytic destruction is seen to suggest osteomyelitis. Electronically Signed   By: Marijo Conception, M.D.   On: 06/22/2018 19:19   Dg C-arm 1-60 Min-no Report  Result Date: 06/02/2018 Fluoroscopy was utilized by the requesting physician.  No radiographic interpretation.   Dg Hip Unilat With Pelvis 2-3 Views Left  Result Date: 06/01/2018 CLINICAL DATA:  Fall tonight with left hip pain. EXAM: DG HIP (WITH OR WITHOUT PELVIS) 2-3V LEFT COMPARISON:  None. FINDINGS: Comminuted displaced intertrochanteric left femur fracture with apex lateral and anterior angulation. Femoral head remains seated. No additional fracture of the pelvis. The pubic rami are intact. IMPRESSION: Comminuted, angulated, and displaced intertrochanteric left femur fracture. Electronically Signed   By: Keith Rake M.D.   On: 06/01/2018 06:28   Dg Femur Min 2 Views Left  Result Date: 06/02/2018 CLINICAL DATA:  Internal fixation of left femur. EXAM: LEFT FEMUR 2 VIEWS FLUOROSCOPY TIME:  57 seconds. COMPARISON:  Radiographs of June 01, 2018. FINDINGS: Four intraoperative fluoroscopic images demonstrate the patient be status post intramedullary rod fixation of proximal left femoral fracture. Good alignment of fracture components is noted. IMPRESSION: Status post intramedullary rod fixation of proximal left femoral fracture. Electronically Signed   By: Marijo Conception, M.D.   On: 06/02/2018 14:19       Subjective: No chest pain or sob. Denies any pain.Marland Kitchen   Discharge Exam: Vitals:   06/25/18 0824 06/25/18 1222  BP: (!) 140/98 (!) 136/50  Pulse: 80 70  Resp: 18 18  Temp: 97.6 F (36.4 C) 98.1 F (36.7 C)  SpO2: 97% 98%   Vitals:   06/25/18 0340 06/25/18 0418 06/25/18 0824 06/25/18 1222  BP: (!) 138/56  (!) 140/98 (!) 136/50  Pulse: 74  80 70  Resp:   18 18  Temp: 98.5 F (36.9 C)  97.6 F (36.4 C) 98.1 F (36.7 C)  TempSrc: Oral   Axillary  SpO2: 96%  97% 98%  Weight:  60.6 kg    Height:        General: Pt is  comfortable , no distress.  Cardiovascular: RRR, S1/S2 +, no rubs, no gallops Respiratory: CTA bilaterally, no wheezing, no rhonchi Abdominal: Soft, NT, ND, bowel sounds + Extremities:stage 3 heel ulcer ,     The results of significant diagnostics from this hospitalization (including imaging, microbiology, ancillary and laboratory) are listed below for reference.     Microbiology: No results found for this or any previous visit (from the past 240 hour(s)).   Labs: BNP (last 3 results) Recent Labs    05/08/18  1406  BNP 4,580.9*   Basic Metabolic Panel: Recent Labs  Lab 06/19/18 1038 06/20/18 0003 06/20/18 0505 06/21/18 1110 06/23/18 0950  NA 141  --  141 139 143  K 4.2  --  3.5 3.6 4.0  CL 107  --  109 107 111  CO2 26  --  '24 25 27  '$ GLUCOSE 96  --  89 103* 142*  BUN 21  --  20 18 25*  CREATININE 1.45* 1.47* 1.43* 1.41* 1.49*  CALCIUM 9.3  --  8.9 8.9 9.3   Liver Function Tests: Recent Labs  Lab 06/19/18 1038 06/20/18 0505  AST 14* 13*  ALT 9 8  ALKPHOS 101 83  BILITOT 0.5 0.5  PROT 6.5 5.5*  ALBUMIN 2.8* 2.4*   No results for input(s): LIPASE, AMYLASE in the last 168 hours. Recent Labs  Lab 06/19/18 1038  AMMONIA 20   CBC: Recent Labs  Lab 06/19/18 1038 06/20/18 0003 06/20/18 0505 06/21/18 1110 06/23/18 0950  WBC 7.4 6.0 5.3 5.3 7.0  NEUTROABS 5.4  --   --   --   --   HGB 9.3* 8.2* 7.6* 8.3* 9.1*  HCT 31.3* 26.7* 24.7* 26.6* 30.1*  MCV 96.6 93.7 94.3 93.0 94.1  PLT 317 300 267 270 295   Cardiac Enzymes: No results for input(s): CKTOTAL, CKMB, CKMBINDEX, TROPONINI in the last 168 hours. BNP: Invalid input(s): POCBNP CBG: Recent Labs  Lab 06/24/18 1201 06/24/18 1658 06/24/18 2114 06/25/18 0703 06/25/18 1115  GLUCAP 144* 174* 136* 117* 182*   D-Dimer No results for input(s): DDIMER in the last 72 hours. Hgb A1c No results for input(s): HGBA1C in the last 72 hours. Lipid Profile No results for input(s): CHOL, HDL, LDLCALC, TRIG,  CHOLHDL, LDLDIRECT in the last 72 hours. Thyroid function studies No results for input(s): TSH, T4TOTAL, T3FREE, THYROIDAB in the last 72 hours.  Invalid input(s): FREET3 Anemia work up No results for input(s): VITAMINB12, FOLATE, FERRITIN, TIBC, IRON, RETICCTPCT in the last 72 hours. Urinalysis    Component Value Date/Time   COLORURINE STRAW (A) 06/19/2018 1325   APPEARANCEUR CLEAR 06/19/2018 1325   LABSPEC 1.009 06/19/2018 1325   PHURINE 6.0 06/19/2018 1325   GLUCOSEU NEGATIVE 06/19/2018 1325   GLUCOSEU NEGATIVE 10/12/2014 1053   HGBUR SMALL (A) 06/19/2018 1325   BILIRUBINUR NEGATIVE 06/19/2018 1325   KETONESUR NEGATIVE 06/19/2018 1325   PROTEINUR NEGATIVE 06/19/2018 1325   UROBILINOGEN 1.0 04/28/2015 1658   NITRITE NEGATIVE 06/19/2018 1325   LEUKOCYTESUR NEGATIVE 06/19/2018 1325   Sepsis Labs Invalid input(s): PROCALCITONIN,  WBC,  LACTICIDVEN Microbiology No results found for this or any previous visit (from the past 240 hour(s)).   Time coordinating discharge: 34 minutes  SIGNED:   Hosie Poisson, MD  [facility] 06/25/2018, 2:44 PM Pager   If 7PM-7AM, please contact night-coverage [email or web address] Password [email or web address]

## 2018-06-25 NOTE — Progress Notes (Signed)
Pt discharge to [facility] and report called off to nurse Dazetta at the facility. IV and telemetry removed; Pt awaiting on [facility] to transport to disposition. Nurse at the facility requesting for pt to be pre-medicated with pain medicine prior to leaving the unit because their facility won't have pt medicines until tomorrow. Reported off to oncoming RN and notified about nurse Dazetta's request. Delia Heady RN

## 2018-06-25 NOTE — Clinical Social Work Placement (Signed)
Nurse to call report to [phone]     CLINICAL SOCIAL WORK PLACEMENT  NOTE  Date:  06/25/2018  Patient Details  Name: Mary Cook MRN: [id number] Date of Birth: [birth year]  Clinical Social Work is seeking post-discharge placement for this patient at the [place] level of care (*CSW will initial, date and re-position this form in  chart as items are completed):  Yes   Patient/family provided with [place] Work Department's list of facilities offering this level of care within the geographic area requested by the patient (or if unable, by the patient's family).  Yes   Patient/family informed of their freedom to choose among providers that offer the needed level of care, that participate in Medicare, Medicaid or managed care program needed by the patient, have an available bed and are willing to accept the patient.  Yes   Patient/family informed of 's ownership interest in [facility] and [facility], as well as of the fact that they are under no obligation to receive care at these facilities.  PASRR submitted to EDS on       PASRR number received on       Existing PASRR number confirmed on       FL2 transmitted to all facilities in geographic area requested by pt/family on       FL2 transmitted to all facilities within larger geographic area on       Patient informed that his/her managed care company has contracts with or will negotiate with certain facilities, including the following:        Yes   Patient/family informed of bed offers received.  Patient chooses bed at [facility] [name])     Physician recommends and patient chooses bed at      Patient to be transferred to [facility]) on 70/01/74.  Patient to be transferred to facility by PT[facility]     Patient family notified on 06/25/18 of transfer.  Name of family member notified:        PHYSICIAN       Additional Comment:     _______________________________________________ Geralynn Ochs, LCSW 06/25/2018, 4:17 PM

## 2018-06-25 NOTE — Progress Notes (Signed)
Patient d/ced and transported to [facility] by [employer]. Assessments remained unchanged prior to discharged.

## 2018-06-25 NOTE — Progress Notes (Signed)
Hypoglycemic Event  CBG: 46  Treatment: 15 GM carbohydrate snack  Symptoms: None  Follow-up CBG: Time:1740 CBG Result:1744  Possible Reasons for Event: Inadequate meal intake  Comments/MD notified: Pt slept through lunch after taking pain medication this am was drowsy to eat lunch when offered. When offered 15G carb snack and CBG up to 58. Pt asymptomatic; follow commands and denies any discomfort/sysmptoms. Pt dinner in room and pt fed her food. Pt CBG up to 85. Resting comfortably in room with call light within reach. Will continue to closely monitor. Francis Gaines Zakarie Sturdivant RN    Lazar Tierce

## 2018-06-26 NOTE — Discharge Instructions (Signed)
Patient instructions given to staff at facility. Assessments remained unchanged prior to d/c

## 2018-06-29 DIAGNOSIS — R569 Unspecified convulsions: Secondary | ICD-10-CM | POA: Diagnosis not present

## 2018-06-29 DIAGNOSIS — E1122 Type 2 diabetes mellitus with diabetic chronic kidney disease: Secondary | ICD-10-CM | POA: Diagnosis not present

## 2018-06-29 DIAGNOSIS — N183 Chronic kidney disease, stage 3 (moderate): Secondary | ICD-10-CM | POA: Diagnosis not present

## 2018-06-29 DIAGNOSIS — I130 Hypertensive heart and chronic kidney disease with heart failure and stage 1 through stage 4 chronic kidney disease, or unspecified chronic kidney disease: Secondary | ICD-10-CM | POA: Diagnosis not present

## 2018-07-01 DIAGNOSIS — D649 Anemia, unspecified: Secondary | ICD-10-CM | POA: Diagnosis not present

## 2018-07-01 DIAGNOSIS — S72002D Fracture of unspecified part of neck of left femur, subsequent encounter for closed fracture with routine healing: Secondary | ICD-10-CM | POA: Diagnosis not present

## 2018-07-01 DIAGNOSIS — R569 Unspecified convulsions: Secondary | ICD-10-CM | POA: Diagnosis not present

## 2018-07-01 DIAGNOSIS — L89623 Pressure ulcer of left heel, stage 3: Secondary | ICD-10-CM | POA: Diagnosis not present

## 2018-07-02 DIAGNOSIS — S72002D Fracture of unspecified part of neck of left femur, subsequent encounter for closed fracture with routine healing: Secondary | ICD-10-CM | POA: Diagnosis not present

## 2018-07-02 DIAGNOSIS — D649 Anemia, unspecified: Secondary | ICD-10-CM | POA: Diagnosis not present

## 2018-07-02 DIAGNOSIS — I130 Hypertensive heart and chronic kidney disease with heart failure and stage 1 through stage 4 chronic kidney disease, or unspecified chronic kidney disease: Secondary | ICD-10-CM | POA: Diagnosis not present

## 2018-07-02 DIAGNOSIS — N183 Chronic kidney disease, stage 3 (moderate): Secondary | ICD-10-CM | POA: Diagnosis not present

## 2018-07-02 DIAGNOSIS — L89620 Pressure ulcer of left heel, unstageable: Secondary | ICD-10-CM | POA: Diagnosis not present

## 2018-07-06 DIAGNOSIS — E1122 Type 2 diabetes mellitus with diabetic chronic kidney disease: Secondary | ICD-10-CM | POA: Diagnosis not present

## 2018-07-06 DIAGNOSIS — S72002D Fracture of unspecified part of neck of left femur, subsequent encounter for closed fracture with routine healing: Secondary | ICD-10-CM | POA: Diagnosis not present

## 2018-07-06 DIAGNOSIS — D649 Anemia, unspecified: Secondary | ICD-10-CM | POA: Diagnosis not present

## 2018-07-06 DIAGNOSIS — N183 Chronic kidney disease, stage 3 (moderate): Secondary | ICD-10-CM | POA: Diagnosis not present

## 2018-07-08 DIAGNOSIS — S72142D Displaced intertrochanteric fracture of left femur, subsequent encounter for closed fracture with routine healing: Secondary | ICD-10-CM | POA: Diagnosis not present

## 2018-07-08 DIAGNOSIS — L89620 Pressure ulcer of left heel, unstageable: Secondary | ICD-10-CM | POA: Diagnosis not present

## 2018-07-09 ENCOUNTER — Encounter: Payer: Self-pay | Admitting: *Deleted

## 2018-07-14 DIAGNOSIS — M1990 Unspecified osteoarthritis, unspecified site: Secondary | ICD-10-CM | POA: Diagnosis not present

## 2018-07-14 DIAGNOSIS — M8580 Other specified disorders of bone density and structure, unspecified site: Secondary | ICD-10-CM | POA: Diagnosis not present

## 2018-07-14 DIAGNOSIS — S72002D Fracture of unspecified part of neck of left femur, subsequent encounter for closed fracture with routine healing: Secondary | ICD-10-CM | POA: Diagnosis not present

## 2018-07-16 ENCOUNTER — Ambulatory Visit (INDEPENDENT_AMBULATORY_CARE_PROVIDER_SITE_OTHER): Payer: Medicare HMO | Admitting: Neurology

## 2018-07-16 ENCOUNTER — Encounter: Payer: Self-pay | Admitting: Neurology

## 2018-07-16 VITALS — BP 150/75 | HR 81 | Ht 65.0 in

## 2018-07-16 DIAGNOSIS — R569 Unspecified convulsions: Secondary | ICD-10-CM | POA: Diagnosis not present

## 2018-07-16 MED ORDER — MEMANTINE HCL 10 MG PO TABS: 10.0000 mg | ORAL_TABLET | Freq: Two times a day (BID) | ORAL | 3 refills | Status: AC

## 2018-07-16 NOTE — Progress Notes (Signed)
[facility] [address] [place]. [place] [address] [phone]       OFFICE CONSULT NOTE  Mary Cook Date of Birth:  [birth year] Medical Record Number:  [id number]   Referring MD:  Brien Few, NP  Reason for Referral:  Dementia and seizure  HPI: [name] is a 84 year old Caucasian lady with past medical history significant for dementia, stroke, coronary artery disease, cardiac stent placement in 2011 and 2015, congestive heart failure, obstructive sleep apnea, recent left hip fracture status post ORIF fixation 6 weeks ago who is accompanied today by an aide from the nursing home.  Patient is unable to provide significant history due to her advanced dementia.  History is obtained from review of recent physician discharge summary and I spoke to the patient's nursing home nurse over the phone.  The patient apparently had a witnessed seizure while visiting her altered pediatric physician office on 06/19/2018.  She started staring off and would not respond and began foaming at the mouth.  This lasted about 5 minutes.  Patient had fallen 2 months ago and fractured her right hip and left ankle.  Patient had a CT scan of the head on 06/19/2018 which I personally reviewed showed remote age right middle cerebral artery, watershed and basal ganglia infarcts on the right.  No acute abnormality.  EEG showed mild generalized slowing.  Patient was started on Keppra 500 twice daily which apparently she is tolerating well.  She has had no further episodes of staring off or seizure-like activity.  Patient at baseline has advanced dementia.  She spends most of her time in a wheelchair.  She speaks a little bit she is able to feed herself.  She is incontinent.  It is unclear after reviewing her chart whether she has been diet or medications like Aricept and Namenda.  [place] has been listed as a possible allergy but under details nausea vomiting diarrhea has been listed.  So it  may not be a true allergy.  There is no prior history of seizures significant head injury with loss of consciousness.  There is no family story of seizures.  ROS:   14 system review of systems is positive for memory loss, gait and balance difficulties and all other systems negative PMH:  Past Medical History:  Diagnosis Date  . CAD (coronary artery disease)    a. 04/2010 MI DES x 2 to LCX;  b. 05/2014 Cath: EF 35%, patent LCX stents w/ 70% stenosis in small AV groove LCX, Diag 50-60, RCA 50-60p->Med Rx.  . Cerebral aneurysm   . Chronic combined systolic (congestive) and diastolic (congestive) heart failure ([place])    a. 01/2015 Echo: EF 30-35%, sev inflat HK, Gr 1DD, mild AI.  Marland Kitchen Diverticulosis of colon   . Essential hypertension   . Frequent urination   . GIB (gastrointestinal bleeding)    a. 2002 Diverticular bleed.  . Hyperlipidemia   . Ischemic cardiomyopathy    a. 01/2015 Echo: EF 30-35%.  . Osteoarthritis   . Osteopenia   . Skin disorder   . Sleep apnea    cpap therapy  . Stroke ([place]) 2002  . T12 compression fracture ([place])    a. 04/2015.  Marland Kitchen Thrombus   . Type II diabetes mellitus ([place])   . Visual disorder     Social History:  Social History   Socioeconomic History  . Marital status: Married    Spouse name: Not on file  . Number of children: Not on file  .  Years of education: Not on file  . Highest education level: Not on file  Occupational History  . Occupation: Retired  Scientific laboratory technician  . Financial resource strain: Not on file  . Food insecurity:    Worry: Not on file    Inability: Not on file  . Transportation needs:    Medical: Not on file    Non-medical: Not on file  Tobacco Use  . Smoking status: Never Smoker  . Smokeless tobacco: Never Used  Substance and Sexual Activity  . Alcohol use: No  . Drug use: No  . Sexual activity: Not on file  Lifestyle  . Physical activity:    Days per week: Not on file    Minutes per session: Not on file  . Stress: Not on  file  Relationships  . Social connections:    Talks on phone: Not on file    Gets together: Not on file    Attends religious service: Not on file    Active member of club or organization: Not on file    Attends meetings of clubs or organizations: Not on file    Relationship status: Not on file  . Intimate partner violence:    Fear of current or ex partner: Not on file    Emotionally abused: Not on file    Physically abused: Not on file    Forced sexual activity: Not on file  Other Topics Concern  . Not on file  Social History Narrative  . Not on file    Medications:   Current Outpatient Medications on File Prior to Visit  Medication Sig Dispense Refill  . acetaminophen (TYLENOL) 325 MG tablet Take 650 mg by mouth 2 (two) times daily.     Marland Kitchen albuterol (PROVENTIL HFA;VENTOLIN HFA) 108 (90 Base) MCG/ACT inhaler Inhale 2 puffs into the lungs every 6 (six) hours as needed for wheezing or shortness of breath. 1 Inhaler 2  . amoxicillin-clavulanate (AUGMENTIN) 500-125 MG tablet Take 1 tablet (500 mg total) by mouth 2 (two) times daily. 14 tablet 0  . aspirin EC 81 MG tablet Take 81 mg by mouth 2 (two) times daily.    . Blood Glucose Monitoring Suppl (ACCU-CHEK AVIVA PLUS) w/Device KIT Use to check blood sugars twice a day Dx. E11.9 1 kit 0  . carvedilol (COREG) 3.125 MG tablet Take 1 tablet (3.125 mg total) by mouth daily. 30 tablet 1  . digoxin (LANOXIN) 0.125 MG tablet Take 0.5 tablets (0.0625 mg total) by mouth daily. (Patient taking differently: Take 0.125 mg by mouth daily. )    . docusate sodium (COLACE) 100 MG capsule Take 100 mg by mouth at bedtime.    . ergocalciferol (VITAMIN D2) 50000 units capsule Take 50,000 Units by mouth every Wednesday.    . famotidine (PEPCID) 10 MG tablet Take 20 mg by mouth daily.    . ferrous sulfate 325 (65 FE) MG tablet Take 1 tablet (325 mg total) by mouth daily with breakfast. 30 tablet 0  . furosemide (LASIX) 20 MG tablet Take 1 tablet (20 mg total)  by mouth every other day. 5 tablet 0  . glipiZIDE (GLUCOTROL) 5 MG tablet Take 0.5 tablets (2.5 mg total) by mouth 2 (two) times daily before a meal. 30 tablet 1  . guaiFENesin (MUCINEX) 600 MG 12 hr tablet Take 1 tablet (600 mg total) by mouth 2 (two) times daily. 60 tablet 2  . guaiFENesin-dextromethorphan (ROBITUSSIN DM) 100-10 MG/5ML syrup Take 5 mLs by mouth 4 (four) times  daily as needed for cough.    Marland Kitchen HYDROcodone-acetaminophen (NORCO) 5-325 MG tablet Take 1-2 tablets by mouth every 6 (six) hours as needed for moderate pain. 30 tablet 0  . isosorbide mononitrate (IMDUR) 30 MG 24 hr tablet Take 0.5 tablets (15 mg total) by mouth daily. (Patient taking differently: Take 10 mg by mouth 2 (two) times daily. ) 30 tablet 1  . levETIRAcetam (KEPPRA) 500 MG tablet Take 1 tablet (500 mg total) by mouth 2 (two) times daily. 60 tablet 0  . mirtazapine (REMERON) 15 MG tablet Take 15 mg by mouth at bedtime.     . polyethylene glycol (MIRALAX / GLYCOLAX) packet Take 17 g by mouth daily as needed for mild constipation. 14 each 0  . potassium chloride SA (K-DUR,KLOR-CON) 20 MEQ tablet Take 1 tablet (20 mEq total) by mouth every other day. 5 tablet 0  . sacubitril-valsartan (ENTRESTO) 49-51 MG Take 0.5 tablets by mouth 2 (two) times daily. 60 tablet 0   No current facility-administered medications on file prior to visit.     Allergies:   Allergies  Allergen Reactions  . Clopidogrel Bisulfate Nausea And Vomiting    Patient is not aware of this allergy  . Lipitor [Atorvastatin] Other (See Comments)    Weak legs  . Namenda [Memantine Hcl]     N/v/d  . Lisinopril Cough    Patient is not aware of this allergy    Physical Exam General: Frail cachectic elderly Caucasian lady, seated, in no evident distress Head: head normocephalic and atraumatic.   Neck: supple with no carotid or supraclavicular bruits Cardiovascular: regular rate and rhythm, no murmurs Musculoskeletal: no deformity Skin:  no  rash/petichiae Vascular:  Normal pulses all extremities  Neurologic Exam Mental Status: Awake and fully alert. Oriented to person only. Recent and remote memory poor t. Attention span, concentration and fund of knowledge diminished. Mood and affect appropriate.  Recall 0/3.  Not able to name more than 2 animals with  4 legs Cranial Nerves: Fundoscopic exam reveals sharp disc margins. Pupils equal, briskly reactive to light. Extraocular movements full without nystagmus. Visual fields full to confrontation. Hearing intact. Facial sensation intact. Face, tongue, palate moves normally and symmetrically.  Motor: Diminished bulk and tone. Normal strength in all tested extremity muscles. Sensory.: intact to touch , pinprick , position and vibratory sensation.  Coordination: Rapid alternating movements normal in all extremities. Finger-to-nose and heel-to-shin performed accurately bilaterally but slowly. Gait and Station: Not tested as patient in wheelchair and unable to walk at baseline ty.  Reflexes: 1+ and symmetric. Toes downgoing.    Modified Rankin 5   ASSESSMENT: 84 year old Caucasian lady with longstanding history of dementia with recent episode of solitary seizure likely symptomatic epilepsy given her prior right brain strokes.     PLAN: I had a long discussion with the patient, her aide and also spoke to the patient's nurse at the nursing facility where she stays regarding her condition and develop plan of care.  Continue Keppra inthe current dose of 500 mg twice daily as she is tolerating it well and has not had any further breakthrough seizures.  Trial of Namenda start 5 mg daily and to 1 week followed by 5 mg twice daily for a week and then 5 mg in the morning and 10 mg at night for 2 weeks and if tolerated increase to 10 mg twice daily.  She has Namenda listed as an allergy but nausea vomiting diarrhea is listed as a reason so it  is not a true allergy.  Greater than 50% time during this  45-minute consultation visit was spent on counseling and coordination of care about her dementia and seizure.  She will follow-up in the future only as necessary.  No scheduled appointment was made Mary Contras, MD  [facility] [address] [place], [place] [address]  Phone [phone] Fax [phone] Note: This document was prepared with digital dictation and possible smart phrase technology. Any transcriptional errors that result from this process are unintentional.

## 2018-07-16 NOTE — Patient Instructions (Signed)
I had a long discussion with the patient, her aide and also spoke to the patient's nurse at the nursing facility where she stays regarding her condition and develop plan of care.  Continue Keppra inthe current dose of 500 mg twice daily as she is tolerating it well and has not had any further breakthrough seizures.  Trial of Namenda start 5 mg daily and to 1 week followed by 5 mg twice daily for a week and then 5 mg in the morning and 10 mg at night for 2 weeks and if tolerated increase to 10 mg twice daily.  She has Namenda listed as an allergy but nausea vomiting diarrhea is listed as a reason so it is not a true allergy.  She will follow-up in the future only as necessary.  No scheduled appointment was made

## 2018-07-19 DIAGNOSIS — I69928 Other speech and language deficits following unspecified cerebrovascular disease: Secondary | ICD-10-CM | POA: Diagnosis not present

## 2018-07-19 DIAGNOSIS — G308 Other Alzheimer's disease: Secondary | ICD-10-CM | POA: Diagnosis not present

## 2018-07-19 DIAGNOSIS — R262 Difficulty in walking, not elsewhere classified: Secondary | ICD-10-CM | POA: Diagnosis not present

## 2018-07-19 DIAGNOSIS — M6281 Muscle weakness (generalized): Secondary | ICD-10-CM | POA: Diagnosis not present

## 2018-07-19 DIAGNOSIS — G4089 Other seizures: Secondary | ICD-10-CM | POA: Diagnosis not present

## 2018-07-19 DIAGNOSIS — I69891 Dysphagia following other cerebrovascular disease: Secondary | ICD-10-CM | POA: Diagnosis not present

## 2018-07-19 DIAGNOSIS — S72142D Displaced intertrochanteric fracture of left femur, subsequent encounter for closed fracture with routine healing: Secondary | ICD-10-CM | POA: Diagnosis not present

## 2018-07-20 ENCOUNTER — Ambulatory Visit: Payer: Medicare HMO | Admitting: Cardiovascular Disease

## 2018-07-20 DIAGNOSIS — G4089 Other seizures: Secondary | ICD-10-CM | POA: Diagnosis not present

## 2018-07-20 DIAGNOSIS — M6281 Muscle weakness (generalized): Secondary | ICD-10-CM | POA: Diagnosis not present

## 2018-07-20 DIAGNOSIS — I69928 Other speech and language deficits following unspecified cerebrovascular disease: Secondary | ICD-10-CM | POA: Diagnosis not present

## 2018-07-20 DIAGNOSIS — I69891 Dysphagia following other cerebrovascular disease: Secondary | ICD-10-CM | POA: Diagnosis not present

## 2018-07-20 DIAGNOSIS — G308 Other Alzheimer's disease: Secondary | ICD-10-CM | POA: Diagnosis not present

## 2018-07-20 DIAGNOSIS — R262 Difficulty in walking, not elsewhere classified: Secondary | ICD-10-CM | POA: Diagnosis not present

## 2018-07-20 DIAGNOSIS — S72142D Displaced intertrochanteric fracture of left femur, subsequent encounter for closed fracture with routine healing: Secondary | ICD-10-CM | POA: Diagnosis not present

## 2018-07-21 DIAGNOSIS — G308 Other Alzheimer's disease: Secondary | ICD-10-CM | POA: Diagnosis not present

## 2018-07-21 DIAGNOSIS — R262 Difficulty in walking, not elsewhere classified: Secondary | ICD-10-CM | POA: Diagnosis not present

## 2018-07-21 DIAGNOSIS — I69891 Dysphagia following other cerebrovascular disease: Secondary | ICD-10-CM | POA: Diagnosis not present

## 2018-07-21 DIAGNOSIS — I69928 Other speech and language deficits following unspecified cerebrovascular disease: Secondary | ICD-10-CM | POA: Diagnosis not present

## 2018-07-21 DIAGNOSIS — S72142D Displaced intertrochanteric fracture of left femur, subsequent encounter for closed fracture with routine healing: Secondary | ICD-10-CM | POA: Diagnosis not present

## 2018-07-21 DIAGNOSIS — G4089 Other seizures: Secondary | ICD-10-CM | POA: Diagnosis not present

## 2018-07-21 DIAGNOSIS — M6281 Muscle weakness (generalized): Secondary | ICD-10-CM | POA: Diagnosis not present

## 2018-07-22 DIAGNOSIS — S72142D Displaced intertrochanteric fracture of left femur, subsequent encounter for closed fracture with routine healing: Secondary | ICD-10-CM | POA: Diagnosis not present

## 2018-07-22 DIAGNOSIS — G308 Other Alzheimer's disease: Secondary | ICD-10-CM | POA: Diagnosis not present

## 2018-07-22 DIAGNOSIS — I5042 Chronic combined systolic (congestive) and diastolic (congestive) heart failure: Secondary | ICD-10-CM | POA: Diagnosis not present

## 2018-07-22 DIAGNOSIS — R569 Unspecified convulsions: Secondary | ICD-10-CM | POA: Diagnosis not present

## 2018-07-22 DIAGNOSIS — G4089 Other seizures: Secondary | ICD-10-CM | POA: Diagnosis not present

## 2018-07-22 DIAGNOSIS — F0390 Unspecified dementia without behavioral disturbance: Secondary | ICD-10-CM | POA: Diagnosis not present

## 2018-07-22 DIAGNOSIS — I69891 Dysphagia following other cerebrovascular disease: Secondary | ICD-10-CM | POA: Diagnosis not present

## 2018-07-22 DIAGNOSIS — M6281 Muscle weakness (generalized): Secondary | ICD-10-CM | POA: Diagnosis not present

## 2018-07-22 DIAGNOSIS — I69928 Other speech and language deficits following unspecified cerebrovascular disease: Secondary | ICD-10-CM | POA: Diagnosis not present

## 2018-07-22 DIAGNOSIS — R262 Difficulty in walking, not elsewhere classified: Secondary | ICD-10-CM | POA: Diagnosis not present

## 2018-07-23 DIAGNOSIS — G308 Other Alzheimer's disease: Secondary | ICD-10-CM | POA: Diagnosis not present

## 2018-07-23 DIAGNOSIS — I69891 Dysphagia following other cerebrovascular disease: Secondary | ICD-10-CM | POA: Diagnosis not present

## 2018-07-23 DIAGNOSIS — G4089 Other seizures: Secondary | ICD-10-CM | POA: Diagnosis not present

## 2018-07-23 DIAGNOSIS — M6281 Muscle weakness (generalized): Secondary | ICD-10-CM | POA: Diagnosis not present

## 2018-07-23 DIAGNOSIS — S72142D Displaced intertrochanteric fracture of left femur, subsequent encounter for closed fracture with routine healing: Secondary | ICD-10-CM | POA: Diagnosis not present

## 2018-07-23 DIAGNOSIS — R262 Difficulty in walking, not elsewhere classified: Secondary | ICD-10-CM | POA: Diagnosis not present

## 2018-07-23 DIAGNOSIS — I69928 Other speech and language deficits following unspecified cerebrovascular disease: Secondary | ICD-10-CM | POA: Diagnosis not present

## 2018-07-24 DIAGNOSIS — I69891 Dysphagia following other cerebrovascular disease: Secondary | ICD-10-CM | POA: Diagnosis not present

## 2018-07-24 DIAGNOSIS — S72142D Displaced intertrochanteric fracture of left femur, subsequent encounter for closed fracture with routine healing: Secondary | ICD-10-CM | POA: Diagnosis not present

## 2018-07-24 DIAGNOSIS — G4089 Other seizures: Secondary | ICD-10-CM | POA: Diagnosis not present

## 2018-07-24 DIAGNOSIS — G308 Other Alzheimer's disease: Secondary | ICD-10-CM | POA: Diagnosis not present

## 2018-07-24 DIAGNOSIS — M6281 Muscle weakness (generalized): Secondary | ICD-10-CM | POA: Diagnosis not present

## 2018-07-24 DIAGNOSIS — R262 Difficulty in walking, not elsewhere classified: Secondary | ICD-10-CM | POA: Diagnosis not present

## 2018-07-24 DIAGNOSIS — I69928 Other speech and language deficits following unspecified cerebrovascular disease: Secondary | ICD-10-CM | POA: Diagnosis not present

## 2018-07-25 DIAGNOSIS — G308 Other Alzheimer's disease: Secondary | ICD-10-CM | POA: Diagnosis not present

## 2018-07-25 DIAGNOSIS — I69928 Other speech and language deficits following unspecified cerebrovascular disease: Secondary | ICD-10-CM | POA: Diagnosis not present

## 2018-07-25 DIAGNOSIS — S72142D Displaced intertrochanteric fracture of left femur, subsequent encounter for closed fracture with routine healing: Secondary | ICD-10-CM | POA: Diagnosis not present

## 2018-07-25 DIAGNOSIS — I69891 Dysphagia following other cerebrovascular disease: Secondary | ICD-10-CM | POA: Diagnosis not present

## 2018-07-25 DIAGNOSIS — M6281 Muscle weakness (generalized): Secondary | ICD-10-CM | POA: Diagnosis not present

## 2018-07-25 DIAGNOSIS — G4089 Other seizures: Secondary | ICD-10-CM | POA: Diagnosis not present

## 2018-07-25 DIAGNOSIS — R262 Difficulty in walking, not elsewhere classified: Secondary | ICD-10-CM | POA: Diagnosis not present

## 2018-07-26 DIAGNOSIS — I69891 Dysphagia following other cerebrovascular disease: Secondary | ICD-10-CM | POA: Diagnosis not present

## 2018-07-26 DIAGNOSIS — I69928 Other speech and language deficits following unspecified cerebrovascular disease: Secondary | ICD-10-CM | POA: Diagnosis not present

## 2018-07-26 DIAGNOSIS — G4089 Other seizures: Secondary | ICD-10-CM | POA: Diagnosis not present

## 2018-07-26 DIAGNOSIS — M6281 Muscle weakness (generalized): Secondary | ICD-10-CM | POA: Diagnosis not present

## 2018-07-26 DIAGNOSIS — R262 Difficulty in walking, not elsewhere classified: Secondary | ICD-10-CM | POA: Diagnosis not present

## 2018-07-26 DIAGNOSIS — G308 Other Alzheimer's disease: Secondary | ICD-10-CM | POA: Diagnosis not present

## 2018-07-26 DIAGNOSIS — S72142D Displaced intertrochanteric fracture of left femur, subsequent encounter for closed fracture with routine healing: Secondary | ICD-10-CM | POA: Diagnosis not present

## 2018-07-27 DIAGNOSIS — S72142D Displaced intertrochanteric fracture of left femur, subsequent encounter for closed fracture with routine healing: Secondary | ICD-10-CM | POA: Diagnosis not present

## 2018-07-27 DIAGNOSIS — M6281 Muscle weakness (generalized): Secondary | ICD-10-CM | POA: Diagnosis not present

## 2018-07-27 DIAGNOSIS — G4089 Other seizures: Secondary | ICD-10-CM | POA: Diagnosis not present

## 2018-07-27 DIAGNOSIS — G308 Other Alzheimer's disease: Secondary | ICD-10-CM | POA: Diagnosis not present

## 2018-07-27 DIAGNOSIS — I69928 Other speech and language deficits following unspecified cerebrovascular disease: Secondary | ICD-10-CM | POA: Diagnosis not present

## 2018-07-27 DIAGNOSIS — I69891 Dysphagia following other cerebrovascular disease: Secondary | ICD-10-CM | POA: Diagnosis not present

## 2018-07-27 DIAGNOSIS — R262 Difficulty in walking, not elsewhere classified: Secondary | ICD-10-CM | POA: Diagnosis not present

## 2018-07-28 DIAGNOSIS — M6281 Muscle weakness (generalized): Secondary | ICD-10-CM | POA: Diagnosis not present

## 2018-07-28 DIAGNOSIS — G308 Other Alzheimer's disease: Secondary | ICD-10-CM | POA: Diagnosis not present

## 2018-07-28 DIAGNOSIS — G4089 Other seizures: Secondary | ICD-10-CM | POA: Diagnosis not present

## 2018-07-28 DIAGNOSIS — S72142D Displaced intertrochanteric fracture of left femur, subsequent encounter for closed fracture with routine healing: Secondary | ICD-10-CM | POA: Diagnosis not present

## 2018-07-28 DIAGNOSIS — I69891 Dysphagia following other cerebrovascular disease: Secondary | ICD-10-CM | POA: Diagnosis not present

## 2018-07-28 DIAGNOSIS — I69928 Other speech and language deficits following unspecified cerebrovascular disease: Secondary | ICD-10-CM | POA: Diagnosis not present

## 2018-07-28 DIAGNOSIS — R262 Difficulty in walking, not elsewhere classified: Secondary | ICD-10-CM | POA: Diagnosis not present

## 2018-07-29 DIAGNOSIS — R262 Difficulty in walking, not elsewhere classified: Secondary | ICD-10-CM | POA: Diagnosis not present

## 2018-07-29 DIAGNOSIS — I69891 Dysphagia following other cerebrovascular disease: Secondary | ICD-10-CM | POA: Diagnosis not present

## 2018-07-29 DIAGNOSIS — S72142D Displaced intertrochanteric fracture of left femur, subsequent encounter for closed fracture with routine healing: Secondary | ICD-10-CM | POA: Diagnosis not present

## 2018-07-29 DIAGNOSIS — M6281 Muscle weakness (generalized): Secondary | ICD-10-CM | POA: Diagnosis not present

## 2018-07-29 DIAGNOSIS — G4089 Other seizures: Secondary | ICD-10-CM | POA: Diagnosis not present

## 2018-07-29 DIAGNOSIS — I69928 Other speech and language deficits following unspecified cerebrovascular disease: Secondary | ICD-10-CM | POA: Diagnosis not present

## 2018-07-29 DIAGNOSIS — G308 Other Alzheimer's disease: Secondary | ICD-10-CM | POA: Diagnosis not present

## 2018-07-29 DIAGNOSIS — L89624 Pressure ulcer of left heel, stage 4: Secondary | ICD-10-CM | POA: Diagnosis not present

## 2018-07-30 DIAGNOSIS — G308 Other Alzheimer's disease: Secondary | ICD-10-CM | POA: Diagnosis not present

## 2018-07-30 DIAGNOSIS — M6281 Muscle weakness (generalized): Secondary | ICD-10-CM | POA: Diagnosis not present

## 2018-07-30 DIAGNOSIS — G4089 Other seizures: Secondary | ICD-10-CM | POA: Diagnosis not present

## 2018-07-30 DIAGNOSIS — F0391 Unspecified dementia with behavioral disturbance: Secondary | ICD-10-CM | POA: Diagnosis not present

## 2018-07-30 DIAGNOSIS — R262 Difficulty in walking, not elsewhere classified: Secondary | ICD-10-CM | POA: Diagnosis not present

## 2018-07-30 DIAGNOSIS — I69891 Dysphagia following other cerebrovascular disease: Secondary | ICD-10-CM | POA: Diagnosis not present

## 2018-07-30 DIAGNOSIS — I69928 Other speech and language deficits following unspecified cerebrovascular disease: Secondary | ICD-10-CM | POA: Diagnosis not present

## 2018-07-30 DIAGNOSIS — S72142D Displaced intertrochanteric fracture of left femur, subsequent encounter for closed fracture with routine healing: Secondary | ICD-10-CM | POA: Diagnosis not present

## 2018-07-31 DIAGNOSIS — I69928 Other speech and language deficits following unspecified cerebrovascular disease: Secondary | ICD-10-CM | POA: Diagnosis not present

## 2018-07-31 DIAGNOSIS — I69891 Dysphagia following other cerebrovascular disease: Secondary | ICD-10-CM | POA: Diagnosis not present

## 2018-07-31 DIAGNOSIS — G308 Other Alzheimer's disease: Secondary | ICD-10-CM | POA: Diagnosis not present

## 2018-07-31 DIAGNOSIS — R262 Difficulty in walking, not elsewhere classified: Secondary | ICD-10-CM | POA: Diagnosis not present

## 2018-07-31 DIAGNOSIS — M6281 Muscle weakness (generalized): Secondary | ICD-10-CM | POA: Diagnosis not present

## 2018-07-31 DIAGNOSIS — G4089 Other seizures: Secondary | ICD-10-CM | POA: Diagnosis not present

## 2018-07-31 DIAGNOSIS — S72142D Displaced intertrochanteric fracture of left femur, subsequent encounter for closed fracture with routine healing: Secondary | ICD-10-CM | POA: Diagnosis not present

## 2018-08-01 DIAGNOSIS — G4089 Other seizures: Secondary | ICD-10-CM | POA: Diagnosis not present

## 2018-08-01 DIAGNOSIS — G308 Other Alzheimer's disease: Secondary | ICD-10-CM | POA: Diagnosis not present

## 2018-08-01 DIAGNOSIS — R262 Difficulty in walking, not elsewhere classified: Secondary | ICD-10-CM | POA: Diagnosis not present

## 2018-08-01 DIAGNOSIS — S72142D Displaced intertrochanteric fracture of left femur, subsequent encounter for closed fracture with routine healing: Secondary | ICD-10-CM | POA: Diagnosis not present

## 2018-08-01 DIAGNOSIS — I69891 Dysphagia following other cerebrovascular disease: Secondary | ICD-10-CM | POA: Diagnosis not present

## 2018-08-01 DIAGNOSIS — I69928 Other speech and language deficits following unspecified cerebrovascular disease: Secondary | ICD-10-CM | POA: Diagnosis not present

## 2018-08-01 DIAGNOSIS — M6281 Muscle weakness (generalized): Secondary | ICD-10-CM | POA: Diagnosis not present

## 2018-08-02 DIAGNOSIS — R262 Difficulty in walking, not elsewhere classified: Secondary | ICD-10-CM | POA: Diagnosis not present

## 2018-08-02 DIAGNOSIS — G4089 Other seizures: Secondary | ICD-10-CM | POA: Diagnosis not present

## 2018-08-02 DIAGNOSIS — M6281 Muscle weakness (generalized): Secondary | ICD-10-CM | POA: Diagnosis not present

## 2018-08-02 DIAGNOSIS — I69891 Dysphagia following other cerebrovascular disease: Secondary | ICD-10-CM | POA: Diagnosis not present

## 2018-08-02 DIAGNOSIS — I69928 Other speech and language deficits following unspecified cerebrovascular disease: Secondary | ICD-10-CM | POA: Diagnosis not present

## 2018-08-02 DIAGNOSIS — G308 Other Alzheimer's disease: Secondary | ICD-10-CM | POA: Diagnosis not present

## 2018-08-02 DIAGNOSIS — S72142D Displaced intertrochanteric fracture of left femur, subsequent encounter for closed fracture with routine healing: Secondary | ICD-10-CM | POA: Diagnosis not present

## 2018-08-03 DIAGNOSIS — S72142D Displaced intertrochanteric fracture of left femur, subsequent encounter for closed fracture with routine healing: Secondary | ICD-10-CM | POA: Diagnosis not present

## 2018-08-03 DIAGNOSIS — R262 Difficulty in walking, not elsewhere classified: Secondary | ICD-10-CM | POA: Diagnosis not present

## 2018-08-03 DIAGNOSIS — G308 Other Alzheimer's disease: Secondary | ICD-10-CM | POA: Diagnosis not present

## 2018-08-03 DIAGNOSIS — G4089 Other seizures: Secondary | ICD-10-CM | POA: Diagnosis not present

## 2018-08-03 DIAGNOSIS — M6281 Muscle weakness (generalized): Secondary | ICD-10-CM | POA: Diagnosis not present

## 2018-08-03 DIAGNOSIS — I69928 Other speech and language deficits following unspecified cerebrovascular disease: Secondary | ICD-10-CM | POA: Diagnosis not present

## 2018-08-03 DIAGNOSIS — I69891 Dysphagia following other cerebrovascular disease: Secondary | ICD-10-CM | POA: Diagnosis not present

## 2018-08-04 DIAGNOSIS — G308 Other Alzheimer's disease: Secondary | ICD-10-CM | POA: Diagnosis not present

## 2018-08-04 DIAGNOSIS — I69928 Other speech and language deficits following unspecified cerebrovascular disease: Secondary | ICD-10-CM | POA: Diagnosis not present

## 2018-08-04 DIAGNOSIS — G4089 Other seizures: Secondary | ICD-10-CM | POA: Diagnosis not present

## 2018-08-04 DIAGNOSIS — R262 Difficulty in walking, not elsewhere classified: Secondary | ICD-10-CM | POA: Diagnosis not present

## 2018-08-04 DIAGNOSIS — M6281 Muscle weakness (generalized): Secondary | ICD-10-CM | POA: Diagnosis not present

## 2018-08-04 DIAGNOSIS — I69891 Dysphagia following other cerebrovascular disease: Secondary | ICD-10-CM | POA: Diagnosis not present

## 2018-08-04 DIAGNOSIS — S72142D Displaced intertrochanteric fracture of left femur, subsequent encounter for closed fracture with routine healing: Secondary | ICD-10-CM | POA: Diagnosis not present

## 2018-08-05 DIAGNOSIS — I69891 Dysphagia following other cerebrovascular disease: Secondary | ICD-10-CM | POA: Diagnosis not present

## 2018-08-05 DIAGNOSIS — G4089 Other seizures: Secondary | ICD-10-CM | POA: Diagnosis not present

## 2018-08-05 DIAGNOSIS — M6281 Muscle weakness (generalized): Secondary | ICD-10-CM | POA: Diagnosis not present

## 2018-08-05 DIAGNOSIS — L89624 Pressure ulcer of left heel, stage 4: Secondary | ICD-10-CM | POA: Diagnosis not present

## 2018-08-05 DIAGNOSIS — G308 Other Alzheimer's disease: Secondary | ICD-10-CM | POA: Diagnosis not present

## 2018-08-05 DIAGNOSIS — S72142D Displaced intertrochanteric fracture of left femur, subsequent encounter for closed fracture with routine healing: Secondary | ICD-10-CM | POA: Diagnosis not present

## 2018-08-05 DIAGNOSIS — R262 Difficulty in walking, not elsewhere classified: Secondary | ICD-10-CM | POA: Diagnosis not present

## 2018-08-05 DIAGNOSIS — I69928 Other speech and language deficits following unspecified cerebrovascular disease: Secondary | ICD-10-CM | POA: Diagnosis not present

## 2018-08-06 DIAGNOSIS — K5900 Constipation, unspecified: Secondary | ICD-10-CM | POA: Diagnosis not present

## 2018-08-06 DIAGNOSIS — I69891 Dysphagia following other cerebrovascular disease: Secondary | ICD-10-CM | POA: Diagnosis not present

## 2018-08-06 DIAGNOSIS — G308 Other Alzheimer's disease: Secondary | ICD-10-CM | POA: Diagnosis not present

## 2018-08-06 DIAGNOSIS — I69928 Other speech and language deficits following unspecified cerebrovascular disease: Secondary | ICD-10-CM | POA: Diagnosis not present

## 2018-08-06 DIAGNOSIS — R262 Difficulty in walking, not elsewhere classified: Secondary | ICD-10-CM | POA: Diagnosis not present

## 2018-08-06 DIAGNOSIS — M6281 Muscle weakness (generalized): Secondary | ICD-10-CM | POA: Diagnosis not present

## 2018-08-06 DIAGNOSIS — G4089 Other seizures: Secondary | ICD-10-CM | POA: Diagnosis not present

## 2018-08-06 DIAGNOSIS — F0391 Unspecified dementia with behavioral disturbance: Secondary | ICD-10-CM | POA: Diagnosis not present

## 2018-08-06 DIAGNOSIS — S72142D Displaced intertrochanteric fracture of left femur, subsequent encounter for closed fracture with routine healing: Secondary | ICD-10-CM | POA: Diagnosis not present

## 2018-08-06 DIAGNOSIS — H04123 Dry eye syndrome of bilateral lacrimal glands: Secondary | ICD-10-CM | POA: Diagnosis not present

## 2018-08-07 DIAGNOSIS — I69928 Other speech and language deficits following unspecified cerebrovascular disease: Secondary | ICD-10-CM | POA: Diagnosis not present

## 2018-08-07 DIAGNOSIS — S72142D Displaced intertrochanteric fracture of left femur, subsequent encounter for closed fracture with routine healing: Secondary | ICD-10-CM | POA: Diagnosis not present

## 2018-08-07 DIAGNOSIS — G308 Other Alzheimer's disease: Secondary | ICD-10-CM | POA: Diagnosis not present

## 2018-08-07 DIAGNOSIS — R262 Difficulty in walking, not elsewhere classified: Secondary | ICD-10-CM | POA: Diagnosis not present

## 2018-08-07 DIAGNOSIS — G4089 Other seizures: Secondary | ICD-10-CM | POA: Diagnosis not present

## 2018-08-07 DIAGNOSIS — M6281 Muscle weakness (generalized): Secondary | ICD-10-CM | POA: Diagnosis not present

## 2018-08-07 DIAGNOSIS — I69891 Dysphagia following other cerebrovascular disease: Secondary | ICD-10-CM | POA: Diagnosis not present

## 2018-08-08 DIAGNOSIS — M6281 Muscle weakness (generalized): Secondary | ICD-10-CM | POA: Diagnosis not present

## 2018-08-08 DIAGNOSIS — I69891 Dysphagia following other cerebrovascular disease: Secondary | ICD-10-CM | POA: Diagnosis not present

## 2018-08-08 DIAGNOSIS — G308 Other Alzheimer's disease: Secondary | ICD-10-CM | POA: Diagnosis not present

## 2018-08-08 DIAGNOSIS — S72142D Displaced intertrochanteric fracture of left femur, subsequent encounter for closed fracture with routine healing: Secondary | ICD-10-CM | POA: Diagnosis not present

## 2018-08-08 DIAGNOSIS — G4089 Other seizures: Secondary | ICD-10-CM | POA: Diagnosis not present

## 2018-08-08 DIAGNOSIS — R262 Difficulty in walking, not elsewhere classified: Secondary | ICD-10-CM | POA: Diagnosis not present

## 2018-08-08 DIAGNOSIS — I69928 Other speech and language deficits following unspecified cerebrovascular disease: Secondary | ICD-10-CM | POA: Diagnosis not present

## 2018-08-09 DIAGNOSIS — S72142D Displaced intertrochanteric fracture of left femur, subsequent encounter for closed fracture with routine healing: Secondary | ICD-10-CM | POA: Diagnosis not present

## 2018-08-09 DIAGNOSIS — G4089 Other seizures: Secondary | ICD-10-CM | POA: Diagnosis not present

## 2018-08-09 DIAGNOSIS — I69928 Other speech and language deficits following unspecified cerebrovascular disease: Secondary | ICD-10-CM | POA: Diagnosis not present

## 2018-08-09 DIAGNOSIS — R262 Difficulty in walking, not elsewhere classified: Secondary | ICD-10-CM | POA: Diagnosis not present

## 2018-08-09 DIAGNOSIS — G308 Other Alzheimer's disease: Secondary | ICD-10-CM | POA: Diagnosis not present

## 2018-08-09 DIAGNOSIS — I69891 Dysphagia following other cerebrovascular disease: Secondary | ICD-10-CM | POA: Diagnosis not present

## 2018-08-09 DIAGNOSIS — M6281 Muscle weakness (generalized): Secondary | ICD-10-CM | POA: Diagnosis not present

## 2018-08-10 DIAGNOSIS — S72142D Displaced intertrochanteric fracture of left femur, subsequent encounter for closed fracture with routine healing: Secondary | ICD-10-CM | POA: Diagnosis not present

## 2018-08-10 DIAGNOSIS — R262 Difficulty in walking, not elsewhere classified: Secondary | ICD-10-CM | POA: Diagnosis not present

## 2018-08-10 DIAGNOSIS — G308 Other Alzheimer's disease: Secondary | ICD-10-CM | POA: Diagnosis not present

## 2018-08-10 DIAGNOSIS — I69891 Dysphagia following other cerebrovascular disease: Secondary | ICD-10-CM | POA: Diagnosis not present

## 2018-08-10 DIAGNOSIS — I69928 Other speech and language deficits following unspecified cerebrovascular disease: Secondary | ICD-10-CM | POA: Diagnosis not present

## 2018-08-10 DIAGNOSIS — M6281 Muscle weakness (generalized): Secondary | ICD-10-CM | POA: Diagnosis not present

## 2018-08-10 DIAGNOSIS — G4089 Other seizures: Secondary | ICD-10-CM | POA: Diagnosis not present

## 2018-08-11 DIAGNOSIS — I69928 Other speech and language deficits following unspecified cerebrovascular disease: Secondary | ICD-10-CM | POA: Diagnosis not present

## 2018-08-11 DIAGNOSIS — G308 Other Alzheimer's disease: Secondary | ICD-10-CM | POA: Diagnosis not present

## 2018-08-11 DIAGNOSIS — I69891 Dysphagia following other cerebrovascular disease: Secondary | ICD-10-CM | POA: Diagnosis not present

## 2018-08-11 DIAGNOSIS — M6281 Muscle weakness (generalized): Secondary | ICD-10-CM | POA: Diagnosis not present

## 2018-08-11 DIAGNOSIS — G4089 Other seizures: Secondary | ICD-10-CM | POA: Diagnosis not present

## 2018-08-11 DIAGNOSIS — R262 Difficulty in walking, not elsewhere classified: Secondary | ICD-10-CM | POA: Diagnosis not present

## 2018-08-11 DIAGNOSIS — S72142D Displaced intertrochanteric fracture of left femur, subsequent encounter for closed fracture with routine healing: Secondary | ICD-10-CM | POA: Diagnosis not present

## 2018-08-12 DIAGNOSIS — L89624 Pressure ulcer of left heel, stage 4: Secondary | ICD-10-CM | POA: Diagnosis not present

## 2018-08-12 DIAGNOSIS — G4089 Other seizures: Secondary | ICD-10-CM | POA: Diagnosis not present

## 2018-08-12 DIAGNOSIS — S72002D Fracture of unspecified part of neck of left femur, subsequent encounter for closed fracture with routine healing: Secondary | ICD-10-CM | POA: Diagnosis not present

## 2018-08-12 DIAGNOSIS — G308 Other Alzheimer's disease: Secondary | ICD-10-CM | POA: Diagnosis not present

## 2018-08-12 DIAGNOSIS — I5042 Chronic combined systolic (congestive) and diastolic (congestive) heart failure: Secondary | ICD-10-CM | POA: Diagnosis not present

## 2018-08-12 DIAGNOSIS — M6281 Muscle weakness (generalized): Secondary | ICD-10-CM | POA: Diagnosis not present

## 2018-08-12 DIAGNOSIS — I2510 Atherosclerotic heart disease of native coronary artery without angina pectoris: Secondary | ICD-10-CM | POA: Diagnosis not present

## 2018-08-12 DIAGNOSIS — R262 Difficulty in walking, not elsewhere classified: Secondary | ICD-10-CM | POA: Diagnosis not present

## 2018-08-12 DIAGNOSIS — I69891 Dysphagia following other cerebrovascular disease: Secondary | ICD-10-CM | POA: Diagnosis not present

## 2018-08-12 DIAGNOSIS — S72142D Displaced intertrochanteric fracture of left femur, subsequent encounter for closed fracture with routine healing: Secondary | ICD-10-CM | POA: Diagnosis not present

## 2018-08-12 DIAGNOSIS — R569 Unspecified convulsions: Secondary | ICD-10-CM | POA: Diagnosis not present

## 2018-08-12 DIAGNOSIS — I69928 Other speech and language deficits following unspecified cerebrovascular disease: Secondary | ICD-10-CM | POA: Diagnosis not present

## 2018-08-13 DIAGNOSIS — R262 Difficulty in walking, not elsewhere classified: Secondary | ICD-10-CM | POA: Diagnosis not present

## 2018-08-13 DIAGNOSIS — G4089 Other seizures: Secondary | ICD-10-CM | POA: Diagnosis not present

## 2018-08-13 DIAGNOSIS — F0391 Unspecified dementia with behavioral disturbance: Secondary | ICD-10-CM | POA: Diagnosis not present

## 2018-08-13 DIAGNOSIS — G308 Other Alzheimer's disease: Secondary | ICD-10-CM | POA: Diagnosis not present

## 2018-08-13 DIAGNOSIS — L89624 Pressure ulcer of left heel, stage 4: Secondary | ICD-10-CM | POA: Diagnosis not present

## 2018-08-13 DIAGNOSIS — I69928 Other speech and language deficits following unspecified cerebrovascular disease: Secondary | ICD-10-CM | POA: Diagnosis not present

## 2018-08-13 DIAGNOSIS — I69891 Dysphagia following other cerebrovascular disease: Secondary | ICD-10-CM | POA: Diagnosis not present

## 2018-08-13 DIAGNOSIS — S72142D Displaced intertrochanteric fracture of left femur, subsequent encounter for closed fracture with routine healing: Secondary | ICD-10-CM | POA: Diagnosis not present

## 2018-08-13 DIAGNOSIS — M6281 Muscle weakness (generalized): Secondary | ICD-10-CM | POA: Diagnosis not present

## 2018-08-13 DIAGNOSIS — E1122 Type 2 diabetes mellitus with diabetic chronic kidney disease: Secondary | ICD-10-CM | POA: Diagnosis not present

## 2018-08-13 DIAGNOSIS — N183 Chronic kidney disease, stage 3 (moderate): Secondary | ICD-10-CM | POA: Diagnosis not present

## 2018-08-14 DIAGNOSIS — G4089 Other seizures: Secondary | ICD-10-CM | POA: Diagnosis not present

## 2018-08-14 DIAGNOSIS — I69891 Dysphagia following other cerebrovascular disease: Secondary | ICD-10-CM | POA: Diagnosis not present

## 2018-08-14 DIAGNOSIS — S72142D Displaced intertrochanteric fracture of left femur, subsequent encounter for closed fracture with routine healing: Secondary | ICD-10-CM | POA: Diagnosis not present

## 2018-08-14 DIAGNOSIS — M6281 Muscle weakness (generalized): Secondary | ICD-10-CM | POA: Diagnosis not present

## 2018-08-14 DIAGNOSIS — G308 Other Alzheimer's disease: Secondary | ICD-10-CM | POA: Diagnosis not present

## 2018-08-14 DIAGNOSIS — R262 Difficulty in walking, not elsewhere classified: Secondary | ICD-10-CM | POA: Diagnosis not present

## 2018-08-14 DIAGNOSIS — I69928 Other speech and language deficits following unspecified cerebrovascular disease: Secondary | ICD-10-CM | POA: Diagnosis not present

## 2018-08-16 DIAGNOSIS — G308 Other Alzheimer's disease: Secondary | ICD-10-CM | POA: Diagnosis not present

## 2018-08-16 DIAGNOSIS — S72142D Displaced intertrochanteric fracture of left femur, subsequent encounter for closed fracture with routine healing: Secondary | ICD-10-CM | POA: Diagnosis not present

## 2018-08-16 DIAGNOSIS — I69891 Dysphagia following other cerebrovascular disease: Secondary | ICD-10-CM | POA: Diagnosis not present

## 2018-08-16 DIAGNOSIS — G4089 Other seizures: Secondary | ICD-10-CM | POA: Diagnosis not present

## 2018-08-16 DIAGNOSIS — M6281 Muscle weakness (generalized): Secondary | ICD-10-CM | POA: Diagnosis not present

## 2018-08-16 DIAGNOSIS — I69928 Other speech and language deficits following unspecified cerebrovascular disease: Secondary | ICD-10-CM | POA: Diagnosis not present

## 2018-08-16 DIAGNOSIS — R262 Difficulty in walking, not elsewhere classified: Secondary | ICD-10-CM | POA: Diagnosis not present

## 2018-08-17 DIAGNOSIS — I69928 Other speech and language deficits following unspecified cerebrovascular disease: Secondary | ICD-10-CM | POA: Diagnosis not present

## 2018-08-17 DIAGNOSIS — S72142D Displaced intertrochanteric fracture of left femur, subsequent encounter for closed fracture with routine healing: Secondary | ICD-10-CM | POA: Diagnosis not present

## 2018-08-17 DIAGNOSIS — R262 Difficulty in walking, not elsewhere classified: Secondary | ICD-10-CM | POA: Diagnosis not present

## 2018-08-17 DIAGNOSIS — M6281 Muscle weakness (generalized): Secondary | ICD-10-CM | POA: Diagnosis not present

## 2018-08-17 DIAGNOSIS — G4089 Other seizures: Secondary | ICD-10-CM | POA: Diagnosis not present

## 2018-08-17 DIAGNOSIS — I69891 Dysphagia following other cerebrovascular disease: Secondary | ICD-10-CM | POA: Diagnosis not present

## 2018-08-17 DIAGNOSIS — G308 Other Alzheimer's disease: Secondary | ICD-10-CM | POA: Diagnosis not present

## 2018-08-18 DIAGNOSIS — I69928 Other speech and language deficits following unspecified cerebrovascular disease: Secondary | ICD-10-CM | POA: Diagnosis not present

## 2018-08-18 DIAGNOSIS — G308 Other Alzheimer's disease: Secondary | ICD-10-CM | POA: Diagnosis not present

## 2018-08-18 DIAGNOSIS — M6281 Muscle weakness (generalized): Secondary | ICD-10-CM | POA: Diagnosis not present

## 2018-08-18 DIAGNOSIS — G4089 Other seizures: Secondary | ICD-10-CM | POA: Diagnosis not present

## 2018-08-18 DIAGNOSIS — I69891 Dysphagia following other cerebrovascular disease: Secondary | ICD-10-CM | POA: Diagnosis not present

## 2018-08-18 DIAGNOSIS — S72142D Displaced intertrochanteric fracture of left femur, subsequent encounter for closed fracture with routine healing: Secondary | ICD-10-CM | POA: Diagnosis not present

## 2018-08-18 DIAGNOSIS — R262 Difficulty in walking, not elsewhere classified: Secondary | ICD-10-CM | POA: Diagnosis not present

## 2018-08-20 DIAGNOSIS — S72142D Displaced intertrochanteric fracture of left femur, subsequent encounter for closed fracture with routine healing: Secondary | ICD-10-CM | POA: Diagnosis not present

## 2018-08-20 DIAGNOSIS — G4089 Other seizures: Secondary | ICD-10-CM | POA: Diagnosis not present

## 2018-08-20 DIAGNOSIS — M6281 Muscle weakness (generalized): Secondary | ICD-10-CM | POA: Diagnosis not present

## 2018-08-20 DIAGNOSIS — I69891 Dysphagia following other cerebrovascular disease: Secondary | ICD-10-CM | POA: Diagnosis not present

## 2018-08-20 DIAGNOSIS — R262 Difficulty in walking, not elsewhere classified: Secondary | ICD-10-CM | POA: Diagnosis not present

## 2018-08-20 DIAGNOSIS — G308 Other Alzheimer's disease: Secondary | ICD-10-CM | POA: Diagnosis not present

## 2018-08-20 DIAGNOSIS — I69928 Other speech and language deficits following unspecified cerebrovascular disease: Secondary | ICD-10-CM | POA: Diagnosis not present

## 2018-08-21 DIAGNOSIS — M6281 Muscle weakness (generalized): Secondary | ICD-10-CM | POA: Diagnosis not present

## 2018-08-21 DIAGNOSIS — G4089 Other seizures: Secondary | ICD-10-CM | POA: Diagnosis not present

## 2018-08-21 DIAGNOSIS — G308 Other Alzheimer's disease: Secondary | ICD-10-CM | POA: Diagnosis not present

## 2018-08-21 DIAGNOSIS — I69891 Dysphagia following other cerebrovascular disease: Secondary | ICD-10-CM | POA: Diagnosis not present

## 2018-08-21 DIAGNOSIS — S72142D Displaced intertrochanteric fracture of left femur, subsequent encounter for closed fracture with routine healing: Secondary | ICD-10-CM | POA: Diagnosis not present

## 2018-08-21 DIAGNOSIS — R262 Difficulty in walking, not elsewhere classified: Secondary | ICD-10-CM | POA: Diagnosis not present

## 2018-08-21 DIAGNOSIS — I69928 Other speech and language deficits following unspecified cerebrovascular disease: Secondary | ICD-10-CM | POA: Diagnosis not present

## 2018-08-22 DIAGNOSIS — S72142D Displaced intertrochanteric fracture of left femur, subsequent encounter for closed fracture with routine healing: Secondary | ICD-10-CM | POA: Diagnosis not present

## 2018-08-22 DIAGNOSIS — G4089 Other seizures: Secondary | ICD-10-CM | POA: Diagnosis not present

## 2018-08-22 DIAGNOSIS — I69891 Dysphagia following other cerebrovascular disease: Secondary | ICD-10-CM | POA: Diagnosis not present

## 2018-08-22 DIAGNOSIS — M6281 Muscle weakness (generalized): Secondary | ICD-10-CM | POA: Diagnosis not present

## 2018-08-22 DIAGNOSIS — I69928 Other speech and language deficits following unspecified cerebrovascular disease: Secondary | ICD-10-CM | POA: Diagnosis not present

## 2018-08-22 DIAGNOSIS — R262 Difficulty in walking, not elsewhere classified: Secondary | ICD-10-CM | POA: Diagnosis not present

## 2018-08-22 DIAGNOSIS — G308 Other Alzheimer's disease: Secondary | ICD-10-CM | POA: Diagnosis not present

## 2018-08-24 DIAGNOSIS — I69928 Other speech and language deficits following unspecified cerebrovascular disease: Secondary | ICD-10-CM | POA: Diagnosis not present

## 2018-08-24 DIAGNOSIS — M6281 Muscle weakness (generalized): Secondary | ICD-10-CM | POA: Diagnosis not present

## 2018-08-24 DIAGNOSIS — S72142D Displaced intertrochanteric fracture of left femur, subsequent encounter for closed fracture with routine healing: Secondary | ICD-10-CM | POA: Diagnosis not present

## 2018-08-24 DIAGNOSIS — R262 Difficulty in walking, not elsewhere classified: Secondary | ICD-10-CM | POA: Diagnosis not present

## 2018-08-24 DIAGNOSIS — I69891 Dysphagia following other cerebrovascular disease: Secondary | ICD-10-CM | POA: Diagnosis not present

## 2018-08-24 DIAGNOSIS — G308 Other Alzheimer's disease: Secondary | ICD-10-CM | POA: Diagnosis not present

## 2018-08-24 DIAGNOSIS — G4089 Other seizures: Secondary | ICD-10-CM | POA: Diagnosis not present

## 2018-08-25 DIAGNOSIS — I69891 Dysphagia following other cerebrovascular disease: Secondary | ICD-10-CM | POA: Diagnosis not present

## 2018-08-25 DIAGNOSIS — G308 Other Alzheimer's disease: Secondary | ICD-10-CM | POA: Diagnosis not present

## 2018-08-25 DIAGNOSIS — I69928 Other speech and language deficits following unspecified cerebrovascular disease: Secondary | ICD-10-CM | POA: Diagnosis not present

## 2018-08-25 DIAGNOSIS — G4089 Other seizures: Secondary | ICD-10-CM | POA: Diagnosis not present

## 2018-08-25 DIAGNOSIS — R262 Difficulty in walking, not elsewhere classified: Secondary | ICD-10-CM | POA: Diagnosis not present

## 2018-08-25 DIAGNOSIS — S72142D Displaced intertrochanteric fracture of left femur, subsequent encounter for closed fracture with routine healing: Secondary | ICD-10-CM | POA: Diagnosis not present

## 2018-08-25 DIAGNOSIS — M6281 Muscle weakness (generalized): Secondary | ICD-10-CM | POA: Diagnosis not present

## 2018-08-26 DIAGNOSIS — G4089 Other seizures: Secondary | ICD-10-CM | POA: Diagnosis not present

## 2018-08-26 DIAGNOSIS — I69928 Other speech and language deficits following unspecified cerebrovascular disease: Secondary | ICD-10-CM | POA: Diagnosis not present

## 2018-08-26 DIAGNOSIS — R262 Difficulty in walking, not elsewhere classified: Secondary | ICD-10-CM | POA: Diagnosis not present

## 2018-08-26 DIAGNOSIS — S72142D Displaced intertrochanteric fracture of left femur, subsequent encounter for closed fracture with routine healing: Secondary | ICD-10-CM | POA: Diagnosis not present

## 2018-08-26 DIAGNOSIS — G308 Other Alzheimer's disease: Secondary | ICD-10-CM | POA: Diagnosis not present

## 2018-08-26 DIAGNOSIS — M6281 Muscle weakness (generalized): Secondary | ICD-10-CM | POA: Diagnosis not present

## 2018-08-26 DIAGNOSIS — I69891 Dysphagia following other cerebrovascular disease: Secondary | ICD-10-CM | POA: Diagnosis not present

## 2018-08-27 DIAGNOSIS — S72142D Displaced intertrochanteric fracture of left femur, subsequent encounter for closed fracture with routine healing: Secondary | ICD-10-CM | POA: Diagnosis not present

## 2018-08-27 DIAGNOSIS — M6281 Muscle weakness (generalized): Secondary | ICD-10-CM | POA: Diagnosis not present

## 2018-08-27 DIAGNOSIS — R262 Difficulty in walking, not elsewhere classified: Secondary | ICD-10-CM | POA: Diagnosis not present

## 2018-08-27 DIAGNOSIS — G4089 Other seizures: Secondary | ICD-10-CM | POA: Diagnosis not present

## 2018-08-27 DIAGNOSIS — I69928 Other speech and language deficits following unspecified cerebrovascular disease: Secondary | ICD-10-CM | POA: Diagnosis not present

## 2018-08-27 DIAGNOSIS — G308 Other Alzheimer's disease: Secondary | ICD-10-CM | POA: Diagnosis not present

## 2018-08-27 DIAGNOSIS — I69891 Dysphagia following other cerebrovascular disease: Secondary | ICD-10-CM | POA: Diagnosis not present

## 2018-08-28 DIAGNOSIS — M6281 Muscle weakness (generalized): Secondary | ICD-10-CM | POA: Diagnosis not present

## 2018-08-28 DIAGNOSIS — S72142D Displaced intertrochanteric fracture of left femur, subsequent encounter for closed fracture with routine healing: Secondary | ICD-10-CM | POA: Diagnosis not present

## 2018-08-28 DIAGNOSIS — R262 Difficulty in walking, not elsewhere classified: Secondary | ICD-10-CM | POA: Diagnosis not present

## 2018-08-28 DIAGNOSIS — G4089 Other seizures: Secondary | ICD-10-CM | POA: Diagnosis not present

## 2018-08-28 DIAGNOSIS — G308 Other Alzheimer's disease: Secondary | ICD-10-CM | POA: Diagnosis not present

## 2018-08-28 DIAGNOSIS — I69891 Dysphagia following other cerebrovascular disease: Secondary | ICD-10-CM | POA: Diagnosis not present

## 2018-08-28 DIAGNOSIS — I69928 Other speech and language deficits following unspecified cerebrovascular disease: Secondary | ICD-10-CM | POA: Diagnosis not present

## 2018-08-31 DIAGNOSIS — M6281 Muscle weakness (generalized): Secondary | ICD-10-CM | POA: Diagnosis not present

## 2018-08-31 DIAGNOSIS — S72142D Displaced intertrochanteric fracture of left femur, subsequent encounter for closed fracture with routine healing: Secondary | ICD-10-CM | POA: Diagnosis not present

## 2018-08-31 DIAGNOSIS — I69928 Other speech and language deficits following unspecified cerebrovascular disease: Secondary | ICD-10-CM | POA: Diagnosis not present

## 2018-08-31 DIAGNOSIS — G4089 Other seizures: Secondary | ICD-10-CM | POA: Diagnosis not present

## 2018-08-31 DIAGNOSIS — I69891 Dysphagia following other cerebrovascular disease: Secondary | ICD-10-CM | POA: Diagnosis not present

## 2018-08-31 DIAGNOSIS — G308 Other Alzheimer's disease: Secondary | ICD-10-CM | POA: Diagnosis not present

## 2018-08-31 DIAGNOSIS — R262 Difficulty in walking, not elsewhere classified: Secondary | ICD-10-CM | POA: Diagnosis not present

## 2018-09-01 DIAGNOSIS — M6281 Muscle weakness (generalized): Secondary | ICD-10-CM | POA: Diagnosis not present

## 2018-09-01 DIAGNOSIS — I69928 Other speech and language deficits following unspecified cerebrovascular disease: Secondary | ICD-10-CM | POA: Diagnosis not present

## 2018-09-01 DIAGNOSIS — S72142D Displaced intertrochanteric fracture of left femur, subsequent encounter for closed fracture with routine healing: Secondary | ICD-10-CM | POA: Diagnosis not present

## 2018-09-01 DIAGNOSIS — G4089 Other seizures: Secondary | ICD-10-CM | POA: Diagnosis not present

## 2018-09-01 DIAGNOSIS — G308 Other Alzheimer's disease: Secondary | ICD-10-CM | POA: Diagnosis not present

## 2018-09-01 DIAGNOSIS — I69891 Dysphagia following other cerebrovascular disease: Secondary | ICD-10-CM | POA: Diagnosis not present

## 2018-09-01 DIAGNOSIS — R262 Difficulty in walking, not elsewhere classified: Secondary | ICD-10-CM | POA: Diagnosis not present

## 2018-09-02 DIAGNOSIS — I69891 Dysphagia following other cerebrovascular disease: Secondary | ICD-10-CM | POA: Diagnosis not present

## 2018-09-02 DIAGNOSIS — I69928 Other speech and language deficits following unspecified cerebrovascular disease: Secondary | ICD-10-CM | POA: Diagnosis not present

## 2018-09-02 DIAGNOSIS — S72142D Displaced intertrochanteric fracture of left femur, subsequent encounter for closed fracture with routine healing: Secondary | ICD-10-CM | POA: Diagnosis not present

## 2018-09-02 DIAGNOSIS — L89624 Pressure ulcer of left heel, stage 4: Secondary | ICD-10-CM | POA: Diagnosis not present

## 2018-09-02 DIAGNOSIS — R569 Unspecified convulsions: Secondary | ICD-10-CM | POA: Diagnosis not present

## 2018-09-02 DIAGNOSIS — R262 Difficulty in walking, not elsewhere classified: Secondary | ICD-10-CM | POA: Diagnosis not present

## 2018-09-02 DIAGNOSIS — G308 Other Alzheimer's disease: Secondary | ICD-10-CM | POA: Diagnosis not present

## 2018-09-02 DIAGNOSIS — I5042 Chronic combined systolic (congestive) and diastolic (congestive) heart failure: Secondary | ICD-10-CM | POA: Diagnosis not present

## 2018-09-02 DIAGNOSIS — G4089 Other seizures: Secondary | ICD-10-CM | POA: Diagnosis not present

## 2018-09-02 DIAGNOSIS — M6281 Muscle weakness (generalized): Secondary | ICD-10-CM | POA: Diagnosis not present

## 2018-09-02 DIAGNOSIS — D649 Anemia, unspecified: Secondary | ICD-10-CM | POA: Diagnosis not present

## 2018-09-02 DIAGNOSIS — I2510 Atherosclerotic heart disease of native coronary artery without angina pectoris: Secondary | ICD-10-CM | POA: Diagnosis not present

## 2018-09-03 DIAGNOSIS — G4089 Other seizures: Secondary | ICD-10-CM | POA: Diagnosis not present

## 2018-09-03 DIAGNOSIS — I69928 Other speech and language deficits following unspecified cerebrovascular disease: Secondary | ICD-10-CM | POA: Diagnosis not present

## 2018-09-03 DIAGNOSIS — G308 Other Alzheimer's disease: Secondary | ICD-10-CM | POA: Diagnosis not present

## 2018-09-03 DIAGNOSIS — I69891 Dysphagia following other cerebrovascular disease: Secondary | ICD-10-CM | POA: Diagnosis not present

## 2018-09-03 DIAGNOSIS — M6281 Muscle weakness (generalized): Secondary | ICD-10-CM | POA: Diagnosis not present

## 2018-09-03 DIAGNOSIS — S72142D Displaced intertrochanteric fracture of left femur, subsequent encounter for closed fracture with routine healing: Secondary | ICD-10-CM | POA: Diagnosis not present

## 2018-09-03 DIAGNOSIS — R262 Difficulty in walking, not elsewhere classified: Secondary | ICD-10-CM | POA: Diagnosis not present

## 2018-09-04 DIAGNOSIS — S72142D Displaced intertrochanteric fracture of left femur, subsequent encounter for closed fracture with routine healing: Secondary | ICD-10-CM | POA: Diagnosis not present

## 2018-09-04 DIAGNOSIS — R262 Difficulty in walking, not elsewhere classified: Secondary | ICD-10-CM | POA: Diagnosis not present

## 2018-09-04 DIAGNOSIS — M6281 Muscle weakness (generalized): Secondary | ICD-10-CM | POA: Diagnosis not present

## 2018-09-04 DIAGNOSIS — I69928 Other speech and language deficits following unspecified cerebrovascular disease: Secondary | ICD-10-CM | POA: Diagnosis not present

## 2018-09-04 DIAGNOSIS — G4089 Other seizures: Secondary | ICD-10-CM | POA: Diagnosis not present

## 2018-09-04 DIAGNOSIS — I69891 Dysphagia following other cerebrovascular disease: Secondary | ICD-10-CM | POA: Diagnosis not present

## 2018-09-04 DIAGNOSIS — G308 Other Alzheimer's disease: Secondary | ICD-10-CM | POA: Diagnosis not present

## 2018-09-05 DIAGNOSIS — G4089 Other seizures: Secondary | ICD-10-CM | POA: Diagnosis not present

## 2018-09-05 DIAGNOSIS — G308 Other Alzheimer's disease: Secondary | ICD-10-CM | POA: Diagnosis not present

## 2018-09-05 DIAGNOSIS — M6281 Muscle weakness (generalized): Secondary | ICD-10-CM | POA: Diagnosis not present

## 2018-09-05 DIAGNOSIS — I69891 Dysphagia following other cerebrovascular disease: Secondary | ICD-10-CM | POA: Diagnosis not present

## 2018-09-05 DIAGNOSIS — S72142D Displaced intertrochanteric fracture of left femur, subsequent encounter for closed fracture with routine healing: Secondary | ICD-10-CM | POA: Diagnosis not present

## 2018-09-05 DIAGNOSIS — R262 Difficulty in walking, not elsewhere classified: Secondary | ICD-10-CM | POA: Diagnosis not present

## 2018-09-05 DIAGNOSIS — I69928 Other speech and language deficits following unspecified cerebrovascular disease: Secondary | ICD-10-CM | POA: Diagnosis not present

## 2018-09-07 DIAGNOSIS — S72142D Displaced intertrochanteric fracture of left femur, subsequent encounter for closed fracture with routine healing: Secondary | ICD-10-CM | POA: Diagnosis not present

## 2018-09-07 DIAGNOSIS — M6281 Muscle weakness (generalized): Secondary | ICD-10-CM | POA: Diagnosis not present

## 2018-09-07 DIAGNOSIS — R262 Difficulty in walking, not elsewhere classified: Secondary | ICD-10-CM | POA: Diagnosis not present

## 2018-09-07 DIAGNOSIS — I69928 Other speech and language deficits following unspecified cerebrovascular disease: Secondary | ICD-10-CM | POA: Diagnosis not present

## 2018-09-07 DIAGNOSIS — G4089 Other seizures: Secondary | ICD-10-CM | POA: Diagnosis not present

## 2018-09-07 DIAGNOSIS — I69891 Dysphagia following other cerebrovascular disease: Secondary | ICD-10-CM | POA: Diagnosis not present

## 2018-09-07 DIAGNOSIS — G308 Other Alzheimer's disease: Secondary | ICD-10-CM | POA: Diagnosis not present

## 2018-09-08 DIAGNOSIS — I69891 Dysphagia following other cerebrovascular disease: Secondary | ICD-10-CM | POA: Diagnosis not present

## 2018-09-08 DIAGNOSIS — S72142D Displaced intertrochanteric fracture of left femur, subsequent encounter for closed fracture with routine healing: Secondary | ICD-10-CM | POA: Diagnosis not present

## 2018-09-08 DIAGNOSIS — M6281 Muscle weakness (generalized): Secondary | ICD-10-CM | POA: Diagnosis not present

## 2018-09-08 DIAGNOSIS — I69928 Other speech and language deficits following unspecified cerebrovascular disease: Secondary | ICD-10-CM | POA: Diagnosis not present

## 2018-09-08 DIAGNOSIS — R262 Difficulty in walking, not elsewhere classified: Secondary | ICD-10-CM | POA: Diagnosis not present

## 2018-09-08 DIAGNOSIS — G308 Other Alzheimer's disease: Secondary | ICD-10-CM | POA: Diagnosis not present

## 2018-09-08 DIAGNOSIS — G4089 Other seizures: Secondary | ICD-10-CM | POA: Diagnosis not present

## 2018-09-09 DIAGNOSIS — R262 Difficulty in walking, not elsewhere classified: Secondary | ICD-10-CM | POA: Diagnosis not present

## 2018-09-09 DIAGNOSIS — G4089 Other seizures: Secondary | ICD-10-CM | POA: Diagnosis not present

## 2018-09-09 DIAGNOSIS — L89624 Pressure ulcer of left heel, stage 4: Secondary | ICD-10-CM | POA: Diagnosis not present

## 2018-09-09 DIAGNOSIS — M6281 Muscle weakness (generalized): Secondary | ICD-10-CM | POA: Diagnosis not present

## 2018-09-09 DIAGNOSIS — I69928 Other speech and language deficits following unspecified cerebrovascular disease: Secondary | ICD-10-CM | POA: Diagnosis not present

## 2018-09-09 DIAGNOSIS — S72142D Displaced intertrochanteric fracture of left femur, subsequent encounter for closed fracture with routine healing: Secondary | ICD-10-CM | POA: Diagnosis not present

## 2018-09-09 DIAGNOSIS — I69891 Dysphagia following other cerebrovascular disease: Secondary | ICD-10-CM | POA: Diagnosis not present

## 2018-09-09 DIAGNOSIS — G308 Other Alzheimer's disease: Secondary | ICD-10-CM | POA: Diagnosis not present

## 2018-09-10 DIAGNOSIS — R262 Difficulty in walking, not elsewhere classified: Secondary | ICD-10-CM | POA: Diagnosis not present

## 2018-09-10 DIAGNOSIS — I69928 Other speech and language deficits following unspecified cerebrovascular disease: Secondary | ICD-10-CM | POA: Diagnosis not present

## 2018-09-10 DIAGNOSIS — L89624 Pressure ulcer of left heel, stage 4: Secondary | ICD-10-CM | POA: Diagnosis not present

## 2018-09-10 DIAGNOSIS — M6281 Muscle weakness (generalized): Secondary | ICD-10-CM | POA: Diagnosis not present

## 2018-09-10 DIAGNOSIS — G308 Other Alzheimer's disease: Secondary | ICD-10-CM | POA: Diagnosis not present

## 2018-09-10 DIAGNOSIS — I69891 Dysphagia following other cerebrovascular disease: Secondary | ICD-10-CM | POA: Diagnosis not present

## 2018-09-10 DIAGNOSIS — G4089 Other seizures: Secondary | ICD-10-CM | POA: Diagnosis not present

## 2018-09-10 DIAGNOSIS — S72142D Displaced intertrochanteric fracture of left femur, subsequent encounter for closed fracture with routine healing: Secondary | ICD-10-CM | POA: Diagnosis not present

## 2018-09-11 DIAGNOSIS — M6281 Muscle weakness (generalized): Secondary | ICD-10-CM | POA: Diagnosis not present

## 2018-09-11 DIAGNOSIS — R262 Difficulty in walking, not elsewhere classified: Secondary | ICD-10-CM | POA: Diagnosis not present

## 2018-09-11 DIAGNOSIS — I69928 Other speech and language deficits following unspecified cerebrovascular disease: Secondary | ICD-10-CM | POA: Diagnosis not present

## 2018-09-11 DIAGNOSIS — S72142D Displaced intertrochanteric fracture of left femur, subsequent encounter for closed fracture with routine healing: Secondary | ICD-10-CM | POA: Diagnosis not present

## 2018-09-11 DIAGNOSIS — G4089 Other seizures: Secondary | ICD-10-CM | POA: Diagnosis not present

## 2018-09-11 DIAGNOSIS — G308 Other Alzheimer's disease: Secondary | ICD-10-CM | POA: Diagnosis not present

## 2018-09-11 DIAGNOSIS — I69891 Dysphagia following other cerebrovascular disease: Secondary | ICD-10-CM | POA: Diagnosis not present

## 2018-09-14 DIAGNOSIS — G308 Other Alzheimer's disease: Secondary | ICD-10-CM | POA: Diagnosis not present

## 2018-09-14 DIAGNOSIS — G4089 Other seizures: Secondary | ICD-10-CM | POA: Diagnosis not present

## 2018-09-14 DIAGNOSIS — I69891 Dysphagia following other cerebrovascular disease: Secondary | ICD-10-CM | POA: Diagnosis not present

## 2018-09-14 DIAGNOSIS — S72142D Displaced intertrochanteric fracture of left femur, subsequent encounter for closed fracture with routine healing: Secondary | ICD-10-CM | POA: Diagnosis not present

## 2018-09-14 DIAGNOSIS — M6281 Muscle weakness (generalized): Secondary | ICD-10-CM | POA: Diagnosis not present

## 2018-09-14 DIAGNOSIS — R262 Difficulty in walking, not elsewhere classified: Secondary | ICD-10-CM | POA: Diagnosis not present

## 2018-09-14 DIAGNOSIS — I69928 Other speech and language deficits following unspecified cerebrovascular disease: Secondary | ICD-10-CM | POA: Diagnosis not present

## 2018-09-15 DIAGNOSIS — G308 Other Alzheimer's disease: Secondary | ICD-10-CM | POA: Diagnosis not present

## 2018-09-15 DIAGNOSIS — I69928 Other speech and language deficits following unspecified cerebrovascular disease: Secondary | ICD-10-CM | POA: Diagnosis not present

## 2018-09-15 DIAGNOSIS — R262 Difficulty in walking, not elsewhere classified: Secondary | ICD-10-CM | POA: Diagnosis not present

## 2018-09-15 DIAGNOSIS — G4089 Other seizures: Secondary | ICD-10-CM | POA: Diagnosis not present

## 2018-09-15 DIAGNOSIS — S72142D Displaced intertrochanteric fracture of left femur, subsequent encounter for closed fracture with routine healing: Secondary | ICD-10-CM | POA: Diagnosis not present

## 2018-09-15 DIAGNOSIS — I69891 Dysphagia following other cerebrovascular disease: Secondary | ICD-10-CM | POA: Diagnosis not present

## 2018-09-15 DIAGNOSIS — M6281 Muscle weakness (generalized): Secondary | ICD-10-CM | POA: Diagnosis not present

## 2018-09-16 DIAGNOSIS — I69891 Dysphagia following other cerebrovascular disease: Secondary | ICD-10-CM | POA: Diagnosis not present

## 2018-09-16 DIAGNOSIS — M6281 Muscle weakness (generalized): Secondary | ICD-10-CM | POA: Diagnosis not present

## 2018-09-16 DIAGNOSIS — G308 Other Alzheimer's disease: Secondary | ICD-10-CM | POA: Diagnosis not present

## 2018-09-16 DIAGNOSIS — R262 Difficulty in walking, not elsewhere classified: Secondary | ICD-10-CM | POA: Diagnosis not present

## 2018-09-16 DIAGNOSIS — L89624 Pressure ulcer of left heel, stage 4: Secondary | ICD-10-CM | POA: Diagnosis not present

## 2018-09-16 DIAGNOSIS — G4089 Other seizures: Secondary | ICD-10-CM | POA: Diagnosis not present

## 2018-09-16 DIAGNOSIS — S72142D Displaced intertrochanteric fracture of left femur, subsequent encounter for closed fracture with routine healing: Secondary | ICD-10-CM | POA: Diagnosis not present

## 2018-09-16 DIAGNOSIS — I69928 Other speech and language deficits following unspecified cerebrovascular disease: Secondary | ICD-10-CM | POA: Diagnosis not present

## 2018-09-17 DIAGNOSIS — S72142D Displaced intertrochanteric fracture of left femur, subsequent encounter for closed fracture with routine healing: Secondary | ICD-10-CM | POA: Diagnosis not present

## 2018-09-17 DIAGNOSIS — I69891 Dysphagia following other cerebrovascular disease: Secondary | ICD-10-CM | POA: Diagnosis not present

## 2018-09-17 DIAGNOSIS — R262 Difficulty in walking, not elsewhere classified: Secondary | ICD-10-CM | POA: Diagnosis not present

## 2018-09-17 DIAGNOSIS — M6281 Muscle weakness (generalized): Secondary | ICD-10-CM | POA: Diagnosis not present

## 2018-09-17 DIAGNOSIS — G4089 Other seizures: Secondary | ICD-10-CM | POA: Diagnosis not present

## 2018-09-17 DIAGNOSIS — I69928 Other speech and language deficits following unspecified cerebrovascular disease: Secondary | ICD-10-CM | POA: Diagnosis not present

## 2018-09-17 DIAGNOSIS — G308 Other Alzheimer's disease: Secondary | ICD-10-CM | POA: Diagnosis not present

## 2018-09-18 DIAGNOSIS — R262 Difficulty in walking, not elsewhere classified: Secondary | ICD-10-CM | POA: Diagnosis not present

## 2018-09-18 DIAGNOSIS — M6281 Muscle weakness (generalized): Secondary | ICD-10-CM | POA: Diagnosis not present

## 2018-09-18 DIAGNOSIS — I69928 Other speech and language deficits following unspecified cerebrovascular disease: Secondary | ICD-10-CM | POA: Diagnosis not present

## 2018-09-18 DIAGNOSIS — G308 Other Alzheimer's disease: Secondary | ICD-10-CM | POA: Diagnosis not present

## 2018-09-18 DIAGNOSIS — S72142D Displaced intertrochanteric fracture of left femur, subsequent encounter for closed fracture with routine healing: Secondary | ICD-10-CM | POA: Diagnosis not present

## 2018-09-18 DIAGNOSIS — G4089 Other seizures: Secondary | ICD-10-CM | POA: Diagnosis not present

## 2018-09-18 DIAGNOSIS — I69891 Dysphagia following other cerebrovascular disease: Secondary | ICD-10-CM | POA: Diagnosis not present

## 2018-09-21 DIAGNOSIS — I69891 Dysphagia following other cerebrovascular disease: Secondary | ICD-10-CM | POA: Diagnosis not present

## 2018-09-21 DIAGNOSIS — R262 Difficulty in walking, not elsewhere classified: Secondary | ICD-10-CM | POA: Diagnosis not present

## 2018-09-21 DIAGNOSIS — G4089 Other seizures: Secondary | ICD-10-CM | POA: Diagnosis not present

## 2018-09-21 DIAGNOSIS — S72142D Displaced intertrochanteric fracture of left femur, subsequent encounter for closed fracture with routine healing: Secondary | ICD-10-CM | POA: Diagnosis not present

## 2018-09-21 DIAGNOSIS — G308 Other Alzheimer's disease: Secondary | ICD-10-CM | POA: Diagnosis not present

## 2018-09-21 DIAGNOSIS — M6281 Muscle weakness (generalized): Secondary | ICD-10-CM | POA: Diagnosis not present

## 2018-09-21 DIAGNOSIS — I69928 Other speech and language deficits following unspecified cerebrovascular disease: Secondary | ICD-10-CM | POA: Diagnosis not present

## 2018-09-22 DIAGNOSIS — R262 Difficulty in walking, not elsewhere classified: Secondary | ICD-10-CM | POA: Diagnosis not present

## 2018-09-22 DIAGNOSIS — R2689 Other abnormalities of gait and mobility: Secondary | ICD-10-CM | POA: Diagnosis not present

## 2018-09-22 DIAGNOSIS — I69891 Dysphagia following other cerebrovascular disease: Secondary | ICD-10-CM | POA: Diagnosis not present

## 2018-09-22 DIAGNOSIS — G4089 Other seizures: Secondary | ICD-10-CM | POA: Diagnosis not present

## 2018-09-22 DIAGNOSIS — I69928 Other speech and language deficits following unspecified cerebrovascular disease: Secondary | ICD-10-CM | POA: Diagnosis not present

## 2018-09-22 DIAGNOSIS — G308 Other Alzheimer's disease: Secondary | ICD-10-CM | POA: Diagnosis not present

## 2018-09-22 DIAGNOSIS — M6281 Muscle weakness (generalized): Secondary | ICD-10-CM | POA: Diagnosis not present

## 2018-09-22 DIAGNOSIS — L89624 Pressure ulcer of left heel, stage 4: Secondary | ICD-10-CM | POA: Diagnosis not present

## 2018-09-22 DIAGNOSIS — S72142D Displaced intertrochanteric fracture of left femur, subsequent encounter for closed fracture with routine healing: Secondary | ICD-10-CM | POA: Diagnosis not present

## 2018-09-23 DIAGNOSIS — G308 Other Alzheimer's disease: Secondary | ICD-10-CM | POA: Diagnosis not present

## 2018-09-23 DIAGNOSIS — M6281 Muscle weakness (generalized): Secondary | ICD-10-CM | POA: Diagnosis not present

## 2018-09-23 DIAGNOSIS — I69891 Dysphagia following other cerebrovascular disease: Secondary | ICD-10-CM | POA: Diagnosis not present

## 2018-09-23 DIAGNOSIS — I69928 Other speech and language deficits following unspecified cerebrovascular disease: Secondary | ICD-10-CM | POA: Diagnosis not present

## 2018-09-23 DIAGNOSIS — R262 Difficulty in walking, not elsewhere classified: Secondary | ICD-10-CM | POA: Diagnosis not present

## 2018-09-23 DIAGNOSIS — G4089 Other seizures: Secondary | ICD-10-CM | POA: Diagnosis not present

## 2018-09-23 DIAGNOSIS — S72142D Displaced intertrochanteric fracture of left femur, subsequent encounter for closed fracture with routine healing: Secondary | ICD-10-CM | POA: Diagnosis not present

## 2018-09-24 DIAGNOSIS — I69928 Other speech and language deficits following unspecified cerebrovascular disease: Secondary | ICD-10-CM | POA: Diagnosis not present

## 2018-09-24 DIAGNOSIS — R262 Difficulty in walking, not elsewhere classified: Secondary | ICD-10-CM | POA: Diagnosis not present

## 2018-09-24 DIAGNOSIS — G308 Other Alzheimer's disease: Secondary | ICD-10-CM | POA: Diagnosis not present

## 2018-09-24 DIAGNOSIS — G4089 Other seizures: Secondary | ICD-10-CM | POA: Diagnosis not present

## 2018-09-24 DIAGNOSIS — I69891 Dysphagia following other cerebrovascular disease: Secondary | ICD-10-CM | POA: Diagnosis not present

## 2018-09-24 DIAGNOSIS — M6281 Muscle weakness (generalized): Secondary | ICD-10-CM | POA: Diagnosis not present

## 2018-09-24 DIAGNOSIS — S72142D Displaced intertrochanteric fracture of left femur, subsequent encounter for closed fracture with routine healing: Secondary | ICD-10-CM | POA: Diagnosis not present

## 2018-09-25 DIAGNOSIS — M6281 Muscle weakness (generalized): Secondary | ICD-10-CM | POA: Diagnosis not present

## 2018-09-25 DIAGNOSIS — S72142D Displaced intertrochanteric fracture of left femur, subsequent encounter for closed fracture with routine healing: Secondary | ICD-10-CM | POA: Diagnosis not present

## 2018-09-25 DIAGNOSIS — R262 Difficulty in walking, not elsewhere classified: Secondary | ICD-10-CM | POA: Diagnosis not present

## 2018-09-25 DIAGNOSIS — I69891 Dysphagia following other cerebrovascular disease: Secondary | ICD-10-CM | POA: Diagnosis not present

## 2018-09-25 DIAGNOSIS — G4089 Other seizures: Secondary | ICD-10-CM | POA: Diagnosis not present

## 2018-09-25 DIAGNOSIS — I69928 Other speech and language deficits following unspecified cerebrovascular disease: Secondary | ICD-10-CM | POA: Diagnosis not present

## 2018-09-25 DIAGNOSIS — G308 Other Alzheimer's disease: Secondary | ICD-10-CM | POA: Diagnosis not present

## 2018-10-01 DIAGNOSIS — L298 Other pruritus: Secondary | ICD-10-CM | POA: Diagnosis not present

## 2018-10-01 DIAGNOSIS — T50905A Adverse effect of unspecified drugs, medicaments and biological substances, initial encounter: Secondary | ICD-10-CM | POA: Diagnosis not present

## 2018-10-04 DIAGNOSIS — M6281 Muscle weakness (generalized): Secondary | ICD-10-CM | POA: Diagnosis not present

## 2018-10-04 DIAGNOSIS — S72142D Displaced intertrochanteric fracture of left femur, subsequent encounter for closed fracture with routine healing: Secondary | ICD-10-CM | POA: Diagnosis not present

## 2018-10-04 DIAGNOSIS — R262 Difficulty in walking, not elsewhere classified: Secondary | ICD-10-CM | POA: Diagnosis not present

## 2018-10-05 DIAGNOSIS — M6281 Muscle weakness (generalized): Secondary | ICD-10-CM | POA: Diagnosis not present

## 2018-10-05 DIAGNOSIS — R262 Difficulty in walking, not elsewhere classified: Secondary | ICD-10-CM | POA: Diagnosis not present

## 2018-10-05 DIAGNOSIS — L89624 Pressure ulcer of left heel, stage 4: Secondary | ICD-10-CM | POA: Diagnosis not present

## 2018-10-05 DIAGNOSIS — S72142D Displaced intertrochanteric fracture of left femur, subsequent encounter for closed fracture with routine healing: Secondary | ICD-10-CM | POA: Diagnosis not present

## 2018-10-06 DIAGNOSIS — L89624 Pressure ulcer of left heel, stage 4: Secondary | ICD-10-CM | POA: Diagnosis not present

## 2018-10-06 DIAGNOSIS — T50905A Adverse effect of unspecified drugs, medicaments and biological substances, initial encounter: Secondary | ICD-10-CM | POA: Diagnosis not present

## 2018-10-06 DIAGNOSIS — M6281 Muscle weakness (generalized): Secondary | ICD-10-CM | POA: Diagnosis not present

## 2018-10-06 DIAGNOSIS — S72142D Displaced intertrochanteric fracture of left femur, subsequent encounter for closed fracture with routine healing: Secondary | ICD-10-CM | POA: Diagnosis not present

## 2018-10-06 DIAGNOSIS — L298 Other pruritus: Secondary | ICD-10-CM | POA: Diagnosis not present

## 2018-10-06 DIAGNOSIS — R262 Difficulty in walking, not elsewhere classified: Secondary | ICD-10-CM | POA: Diagnosis not present

## 2018-10-06 DIAGNOSIS — R2689 Other abnormalities of gait and mobility: Secondary | ICD-10-CM | POA: Diagnosis not present

## 2018-10-07 DIAGNOSIS — R262 Difficulty in walking, not elsewhere classified: Secondary | ICD-10-CM | POA: Diagnosis not present

## 2018-10-07 DIAGNOSIS — S72142D Displaced intertrochanteric fracture of left femur, subsequent encounter for closed fracture with routine healing: Secondary | ICD-10-CM | POA: Diagnosis not present

## 2018-10-07 DIAGNOSIS — M6281 Muscle weakness (generalized): Secondary | ICD-10-CM | POA: Diagnosis not present

## 2018-10-08 DIAGNOSIS — R262 Difficulty in walking, not elsewhere classified: Secondary | ICD-10-CM | POA: Diagnosis not present

## 2018-10-08 DIAGNOSIS — S72142D Displaced intertrochanteric fracture of left femur, subsequent encounter for closed fracture with routine healing: Secondary | ICD-10-CM | POA: Diagnosis not present

## 2018-10-08 DIAGNOSIS — M6281 Muscle weakness (generalized): Secondary | ICD-10-CM | POA: Diagnosis not present

## 2018-10-09 DIAGNOSIS — R262 Difficulty in walking, not elsewhere classified: Secondary | ICD-10-CM | POA: Diagnosis not present

## 2018-10-09 DIAGNOSIS — S72142D Displaced intertrochanteric fracture of left femur, subsequent encounter for closed fracture with routine healing: Secondary | ICD-10-CM | POA: Diagnosis not present

## 2018-10-09 DIAGNOSIS — M6281 Muscle weakness (generalized): Secondary | ICD-10-CM | POA: Diagnosis not present

## 2018-10-12 DIAGNOSIS — S72142D Displaced intertrochanteric fracture of left femur, subsequent encounter for closed fracture with routine healing: Secondary | ICD-10-CM | POA: Diagnosis not present

## 2018-10-12 DIAGNOSIS — M6281 Muscle weakness (generalized): Secondary | ICD-10-CM | POA: Diagnosis not present

## 2018-10-12 DIAGNOSIS — R262 Difficulty in walking, not elsewhere classified: Secondary | ICD-10-CM | POA: Diagnosis not present

## 2018-10-13 DIAGNOSIS — R262 Difficulty in walking, not elsewhere classified: Secondary | ICD-10-CM | POA: Diagnosis not present

## 2018-10-13 DIAGNOSIS — F0391 Unspecified dementia with behavioral disturbance: Secondary | ICD-10-CM | POA: Diagnosis not present

## 2018-10-13 DIAGNOSIS — S72142D Displaced intertrochanteric fracture of left femur, subsequent encounter for closed fracture with routine healing: Secondary | ICD-10-CM | POA: Diagnosis not present

## 2018-10-13 DIAGNOSIS — M6281 Muscle weakness (generalized): Secondary | ICD-10-CM | POA: Diagnosis not present

## 2018-10-14 DIAGNOSIS — I5042 Chronic combined systolic (congestive) and diastolic (congestive) heart failure: Secondary | ICD-10-CM | POA: Diagnosis not present

## 2018-10-14 DIAGNOSIS — R569 Unspecified convulsions: Secondary | ICD-10-CM | POA: Diagnosis not present

## 2018-10-14 DIAGNOSIS — R262 Difficulty in walking, not elsewhere classified: Secondary | ICD-10-CM | POA: Diagnosis not present

## 2018-10-14 DIAGNOSIS — M6281 Muscle weakness (generalized): Secondary | ICD-10-CM | POA: Diagnosis not present

## 2018-10-14 DIAGNOSIS — H04123 Dry eye syndrome of bilateral lacrimal glands: Secondary | ICD-10-CM | POA: Diagnosis not present

## 2018-10-14 DIAGNOSIS — S72142D Displaced intertrochanteric fracture of left femur, subsequent encounter for closed fracture with routine healing: Secondary | ICD-10-CM | POA: Diagnosis not present

## 2018-10-15 DIAGNOSIS — F0391 Unspecified dementia with behavioral disturbance: Secondary | ICD-10-CM | POA: Diagnosis not present

## 2018-10-15 DIAGNOSIS — I130 Hypertensive heart and chronic kidney disease with heart failure and stage 1 through stage 4 chronic kidney disease, or unspecified chronic kidney disease: Secondary | ICD-10-CM | POA: Diagnosis not present

## 2018-10-15 DIAGNOSIS — I5042 Chronic combined systolic (congestive) and diastolic (congestive) heart failure: Secondary | ICD-10-CM | POA: Diagnosis not present

## 2018-10-15 DIAGNOSIS — L298 Other pruritus: Secondary | ICD-10-CM | POA: Diagnosis not present

## 2018-10-16 DIAGNOSIS — S72142D Displaced intertrochanteric fracture of left femur, subsequent encounter for closed fracture with routine healing: Secondary | ICD-10-CM | POA: Diagnosis not present

## 2018-10-16 DIAGNOSIS — M6281 Muscle weakness (generalized): Secondary | ICD-10-CM | POA: Diagnosis not present

## 2018-10-16 DIAGNOSIS — R262 Difficulty in walking, not elsewhere classified: Secondary | ICD-10-CM | POA: Diagnosis not present

## 2018-10-19 DIAGNOSIS — R262 Difficulty in walking, not elsewhere classified: Secondary | ICD-10-CM | POA: Diagnosis not present

## 2018-10-19 DIAGNOSIS — M6281 Muscle weakness (generalized): Secondary | ICD-10-CM | POA: Diagnosis not present

## 2018-10-19 DIAGNOSIS — S72142D Displaced intertrochanteric fracture of left femur, subsequent encounter for closed fracture with routine healing: Secondary | ICD-10-CM | POA: Diagnosis not present

## 2018-11-23 DIAGNOSIS — F0391 Unspecified dementia with behavioral disturbance: Secondary | ICD-10-CM | POA: Diagnosis not present

## 2018-11-23 DIAGNOSIS — I5042 Chronic combined systolic (congestive) and diastolic (congestive) heart failure: Secondary | ICD-10-CM | POA: Diagnosis not present

## 2018-11-23 DIAGNOSIS — T50905A Adverse effect of unspecified drugs, medicaments and biological substances, initial encounter: Secondary | ICD-10-CM | POA: Diagnosis not present

## 2018-11-25 DIAGNOSIS — E785 Hyperlipidemia, unspecified: Secondary | ICD-10-CM | POA: Diagnosis not present

## 2018-11-25 DIAGNOSIS — E119 Type 2 diabetes mellitus without complications: Secondary | ICD-10-CM | POA: Diagnosis not present

## 2018-11-25 DIAGNOSIS — N183 Chronic kidney disease, stage 3 (moderate): Secondary | ICD-10-CM | POA: Diagnosis not present

## 2018-11-25 DIAGNOSIS — D638 Anemia in other chronic diseases classified elsewhere: Secondary | ICD-10-CM | POA: Diagnosis not present

## 2018-11-25 DIAGNOSIS — D649 Anemia, unspecified: Secondary | ICD-10-CM | POA: Diagnosis not present

## 2018-11-25 DIAGNOSIS — R569 Unspecified convulsions: Secondary | ICD-10-CM | POA: Diagnosis not present

## 2018-11-25 DIAGNOSIS — G4089 Other seizures: Secondary | ICD-10-CM | POA: Diagnosis not present

## 2018-11-26 DIAGNOSIS — F0391 Unspecified dementia with behavioral disturbance: Secondary | ICD-10-CM | POA: Diagnosis not present

## 2018-12-10 DIAGNOSIS — E1122 Type 2 diabetes mellitus with diabetic chronic kidney disease: Secondary | ICD-10-CM | POA: Diagnosis not present

## 2018-12-10 DIAGNOSIS — N183 Chronic kidney disease, stage 3 (moderate): Secondary | ICD-10-CM | POA: Diagnosis not present

## 2018-12-10 DIAGNOSIS — F0391 Unspecified dementia with behavioral disturbance: Secondary | ICD-10-CM | POA: Diagnosis not present

## 2018-12-10 DIAGNOSIS — K219 Gastro-esophageal reflux disease without esophagitis: Secondary | ICD-10-CM | POA: Diagnosis not present

## 2019-01-12 DIAGNOSIS — G308 Other Alzheimer's disease: Secondary | ICD-10-CM | POA: Diagnosis not present

## 2019-01-12 DIAGNOSIS — R262 Difficulty in walking, not elsewhere classified: Secondary | ICD-10-CM | POA: Diagnosis not present

## 2019-01-12 DIAGNOSIS — M6281 Muscle weakness (generalized): Secondary | ICD-10-CM | POA: Diagnosis not present

## 2019-01-12 DIAGNOSIS — S72142D Displaced intertrochanteric fracture of left femur, subsequent encounter for closed fracture with routine healing: Secondary | ICD-10-CM | POA: Diagnosis not present

## 2019-01-13 ENCOUNTER — Observation Stay (HOSPITAL_COMMUNITY): Payer: Medicare HMO

## 2019-01-13 ENCOUNTER — Emergency Department (HOSPITAL_COMMUNITY): Payer: Medicare HMO

## 2019-01-13 ENCOUNTER — Other Ambulatory Visit: Payer: Self-pay

## 2019-01-13 ENCOUNTER — Encounter (HOSPITAL_COMMUNITY): Payer: Self-pay

## 2019-01-13 ENCOUNTER — Observation Stay (HOSPITAL_COMMUNITY)
Admission: EM | Admit: 2019-01-13 | Discharge: 2019-01-14 | Disposition: A | Discharge status: Home / Self Care | Payer: Medicare HMO | Attending: Internal Medicine | Admitting: Internal Medicine

## 2019-01-13 DIAGNOSIS — Z1159 Encounter for screening for other viral diseases: Secondary | ICD-10-CM | POA: Diagnosis not present

## 2019-01-13 DIAGNOSIS — G9340 Encephalopathy, unspecified: Secondary | ICD-10-CM | POA: Diagnosis not present

## 2019-01-13 DIAGNOSIS — I5042 Chronic combined systolic (congestive) and diastolic (congestive) heart failure: Secondary | ICD-10-CM | POA: Diagnosis present

## 2019-01-13 DIAGNOSIS — Z7982 Long term (current) use of aspirin: Secondary | ICD-10-CM | POA: Diagnosis not present

## 2019-01-13 DIAGNOSIS — E119 Type 2 diabetes mellitus without complications: Secondary | ICD-10-CM | POA: Diagnosis not present

## 2019-01-13 DIAGNOSIS — I10 Essential (primary) hypertension: Secondary | ICD-10-CM | POA: Diagnosis not present

## 2019-01-13 DIAGNOSIS — R112 Nausea with vomiting, unspecified: Secondary | ICD-10-CM | POA: Diagnosis not present

## 2019-01-13 DIAGNOSIS — N189 Chronic kidney disease, unspecified: Secondary | ICD-10-CM | POA: Diagnosis present

## 2019-01-13 DIAGNOSIS — I2510 Atherosclerotic heart disease of native coronary artery without angina pectoris: Secondary | ICD-10-CM | POA: Diagnosis not present

## 2019-01-13 DIAGNOSIS — G309 Alzheimer's disease, unspecified: Secondary | ICD-10-CM | POA: Diagnosis not present

## 2019-01-13 DIAGNOSIS — Z9861 Coronary angioplasty status: Secondary | ICD-10-CM

## 2019-01-13 DIAGNOSIS — R29810 Facial weakness: Secondary | ICD-10-CM | POA: Diagnosis not present

## 2019-01-13 DIAGNOSIS — Z79899 Other long term (current) drug therapy: Secondary | ICD-10-CM | POA: Diagnosis not present

## 2019-01-13 DIAGNOSIS — R001 Bradycardia, unspecified: Secondary | ICD-10-CM | POA: Diagnosis not present

## 2019-01-13 DIAGNOSIS — R7989 Other specified abnormal findings of blood chemistry: Secondary | ICD-10-CM | POA: Diagnosis present

## 2019-01-13 DIAGNOSIS — G4733 Obstructive sleep apnea (adult) (pediatric): Secondary | ICD-10-CM | POA: Diagnosis not present

## 2019-01-13 DIAGNOSIS — Z9989 Dependence on other enabling machines and devices: Secondary | ICD-10-CM

## 2019-01-13 DIAGNOSIS — R569 Unspecified convulsions: Secondary | ICD-10-CM | POA: Diagnosis not present

## 2019-01-13 DIAGNOSIS — Z20828 Contact with and (suspected) exposure to other viral communicable diseases: Secondary | ICD-10-CM | POA: Diagnosis not present

## 2019-01-13 DIAGNOSIS — I700 Atherosclerosis of aorta: Secondary | ICD-10-CM | POA: Diagnosis not present

## 2019-01-13 DIAGNOSIS — R404 Transient alteration of awareness: Secondary | ICD-10-CM | POA: Diagnosis not present

## 2019-01-13 DIAGNOSIS — Z8673 Personal history of transient ischemic attack (TIA), and cerebral infarction without residual deficits: Secondary | ICD-10-CM | POA: Diagnosis not present

## 2019-01-13 DIAGNOSIS — R0902 Hypoxemia: Secondary | ICD-10-CM

## 2019-01-13 DIAGNOSIS — I6523 Occlusion and stenosis of bilateral carotid arteries: Secondary | ICD-10-CM | POA: Diagnosis not present

## 2019-01-13 DIAGNOSIS — I639 Cerebral infarction, unspecified: Secondary | ICD-10-CM

## 2019-01-13 DIAGNOSIS — R778 Other specified abnormalities of plasma proteins: Secondary | ICD-10-CM | POA: Diagnosis present

## 2019-01-13 DIAGNOSIS — I6389 Other cerebral infarction: Secondary | ICD-10-CM | POA: Diagnosis not present

## 2019-01-13 DIAGNOSIS — F0280 Dementia in other diseases classified elsewhere without behavioral disturbance: Secondary | ICD-10-CM | POA: Diagnosis present

## 2019-01-13 HISTORY — DX: Dementia in other diseases classified elsewhere, unspecified severity, without behavioral disturbance, psychotic disturbance, mood disturbance, and anxiety: F02.80

## 2019-01-13 HISTORY — DX: Unspecified fracture of unspecified femur, initial encounter for closed fracture: S72.90XA

## 2019-01-13 HISTORY — DX: Unspecified convulsions: R56.9

## 2019-01-13 HISTORY — DX: Dysphagia, unspecified: R13.10

## 2019-01-13 HISTORY — DX: Disorder of kidney and ureter, unspecified: N28.9

## 2019-01-13 HISTORY — DX: Muscle weakness (generalized): M62.81

## 2019-01-13 HISTORY — DX: Pressure ulcer of unspecified site, unspecified stage: L89.90

## 2019-01-13 LAB — COMPREHENSIVE METABOLIC PANEL
ALT: 15 U/L (ref 0–44)
AST: 20 U/L (ref 15–41)
Albumin: 3.2 g/dL — ABNORMAL LOW (ref 3.5–5.0)
Alkaline Phosphatase: 55 U/L (ref 38–126)
Anion gap: 9 (ref 5–15)
BUN: 31 mg/dL — ABNORMAL HIGH (ref 8–23)
CO2: 25 mmol/L (ref 22–32)
Calcium: 9.3 mg/dL (ref 8.9–10.3)
Chloride: 107 mmol/L (ref 98–111)
Creatinine, Ser: 1.55 mg/dL — ABNORMAL HIGH (ref 0.44–1.00)
GFR calc Af Amer: 34 mL/min — ABNORMAL LOW (ref >60)
GFR calc non Af Amer: 29 mL/min — ABNORMAL LOW (ref >60)
Glucose, Bld: 174 mg/dL — ABNORMAL HIGH (ref 70–99)
Potassium: 3.6 mmol/L (ref 3.5–5.1)
Sodium: 141 mmol/L (ref 135–145)
Total Bilirubin: 0.4 mg/dL (ref 0.3–1.2)
Total Protein: 6.2 g/dL — ABNORMAL LOW (ref 6.5–8.1)

## 2019-01-13 LAB — URINALYSIS, ROUTINE W REFLEX MICROSCOPIC
Bilirubin Urine: NEGATIVE
Glucose, UA: NEGATIVE mg/dL
Hgb urine dipstick: NEGATIVE
Ketones, ur: NEGATIVE mg/dL
Leukocytes,Ua: NEGATIVE
Nitrite: NEGATIVE
Protein, ur: NEGATIVE mg/dL
Specific Gravity, Urine: 1.009 (ref 1.005–1.030)
pH: 6 (ref 5.0–8.0)

## 2019-01-13 LAB — RAPID URINE DRUG SCREEN, HOSP PERFORMED
Amphetamines: NOT DETECTED
Barbiturates: NOT DETECTED
Benzodiazepines: NOT DETECTED
Cocaine: NOT DETECTED
Opiates: NOT DETECTED
Tetrahydrocannabinol: NOT DETECTED

## 2019-01-13 LAB — DIFFERENTIAL
Abs Immature Granulocytes: 0.01 10*3/uL (ref 0.00–0.07)
Basophils Absolute: 0 10*3/uL (ref 0.0–0.1)
Basophils Relative: 1 %
Eosinophils Absolute: 0.2 10*3/uL (ref 0.0–0.5)
Eosinophils Relative: 3 %
Immature Granulocytes: 0 %
Lymphocytes Relative: 25 %
Lymphs Abs: 1.4 10*3/uL (ref 0.7–4.0)
Monocytes Absolute: 0.4 10*3/uL (ref 0.1–1.0)
Monocytes Relative: 8 %
Neutro Abs: 3.5 10*3/uL (ref 1.7–7.7)
Neutrophils Relative %: 63 %

## 2019-01-13 LAB — LACTIC ACID, PLASMA
Lactic Acid, Venous: 0.9 mmol/L (ref 0.5–1.9)
Lactic Acid, Venous: 1.4 mmol/L (ref 0.5–1.9)

## 2019-01-13 LAB — CBC
HCT: 31.2 % — ABNORMAL LOW (ref 36.0–46.0)
Hemoglobin: 10.2 g/dL — ABNORMAL LOW (ref 12.0–15.0)
MCH: 31.7 pg (ref 26.0–34.0)
MCHC: 32.7 g/dL (ref 30.0–36.0)
MCV: 96.9 fL (ref 80.0–100.0)
Platelets: 145 10*3/uL — ABNORMAL LOW (ref 150–400)
RBC: 3.22 MIL/uL — ABNORMAL LOW (ref 3.87–5.11)
RDW: 11.4 % — ABNORMAL LOW (ref 11.5–15.5)
WBC: 5.6 10*3/uL (ref 4.0–10.5)
nRBC: 0 % (ref 0.0–0.2)

## 2019-01-13 LAB — APTT: aPTT: 29 seconds (ref 24–36)

## 2019-01-13 LAB — TROPONIN I: Troponin I: 0.24 ng/mL (ref <0.03)

## 2019-01-13 LAB — SARS CORONAVIRUS 2 BY RT PCR (HOSPITAL ORDER, PERFORMED IN ~~LOC~~ HOSPITAL LAB): SARS Coronavirus 2: NEGATIVE

## 2019-01-13 LAB — ETHANOL: Alcohol, Ethyl (B): <10 mg/dL (ref <10)

## 2019-01-13 LAB — PROTIME-INR
INR: 1 (ref 0.8–1.2)
Prothrombin Time: 13.3 seconds (ref 11.4–15.2)

## 2019-01-13 LAB — VALPROIC ACID LEVEL: Valproic Acid Lvl: <10 ug/mL — ABNORMAL LOW (ref 50.0–100.0)

## 2019-01-13 LAB — GLUCOSE, CAPILLARY
Glucose-Capillary: 145 mg/dL — ABNORMAL HIGH (ref 70–99)
Glucose-Capillary: 128 mg/dL — ABNORMAL HIGH (ref 70–99)

## 2019-01-13 LAB — LIPASE, BLOOD: Lipase: 31 U/L (ref 11–51)

## 2019-01-13 MED ORDER — ACETAMINOPHEN 160 MG/5ML PO SOLN: 650.0000 mg | ORAL | Status: DC | PRN

## 2019-01-13 MED ORDER — ONDANSETRON HCL 4 MG/2ML IJ SOLN: 4.0000 mg | INTRAMUSCULAR | Status: DC | PRN

## 2019-01-13 MED ORDER — MIRTAZAPINE 15 MG PO TABS: 22.5000 mg | ORAL_TABLET | Freq: Every day | ORAL | Status: DC

## 2019-01-13 MED ORDER — LEVETIRACETAM 500 MG PO TABS: 500.0000 mg | ORAL_TABLET | Freq: Two times a day (BID) | ORAL | Status: DC

## 2019-01-13 MED ORDER — HALOPERIDOL LACTATE 5 MG/ML IJ SOLN
2.0000 mg | Freq: Four times a day (QID) | INTRAMUSCULAR | Status: DC | PRN
  Administered 2019-01-13: 2 mg via INTRAVENOUS
  Filled 2019-01-13: qty 1

## 2019-01-13 MED ORDER — DIGOXIN 125 MCG PO TABS: 62.5000 ug | ORAL_TABLET | Freq: Every day | ORAL | Status: DC

## 2019-01-13 MED ORDER — CARVEDILOL 3.125 MG PO TABS: 3.1250 mg | ORAL_TABLET | Freq: Every day | ORAL | Status: DC

## 2019-01-13 MED ORDER — INSULIN ASPART 100 UNIT/ML ~~LOC~~ SOLN
0.0000 [IU] | Freq: Three times a day (TID) | SUBCUTANEOUS | Status: DC
  Administered 2019-01-13: 1 [IU] via SUBCUTANEOUS

## 2019-01-13 MED ORDER — MEMANTINE HCL 10 MG PO TABS: 10.0000 mg | ORAL_TABLET | Freq: Two times a day (BID) | ORAL | Status: DC

## 2019-01-13 MED ORDER — ASPIRIN 300 MG RE SUPP
300.0000 mg | Freq: Once | RECTAL | Status: AC
  Administered 2019-01-13: 300 mg via RECTAL
  Filled 2019-01-13: qty 1

## 2019-01-13 MED ORDER — INSULIN ASPART 100 UNIT/ML ~~LOC~~ SOLN: 0.0000 [IU] | Freq: Every day | SUBCUTANEOUS | Status: DC

## 2019-01-13 MED ORDER — HEPARIN SODIUM (PORCINE) 5000 UNIT/ML IJ SOLN
5000.0000 [IU] | Freq: Three times a day (TID) | INTRAMUSCULAR | Status: DC
  Administered 2019-01-13 – 2019-01-14 (×2): 5000 [IU] via SUBCUTANEOUS
  Filled 2019-01-13 (×2): qty 1

## 2019-01-13 MED ORDER — GUAIFENESIN ER 600 MG PO TB12: 600.0000 mg | ORAL_TABLET | Freq: Two times a day (BID) | ORAL | Status: DC

## 2019-01-13 MED ORDER — DIVALPROEX SODIUM 125 MG PO DR TAB
125.0000 mg | DELAYED_RELEASE_TABLET | Freq: Three times a day (TID) | ORAL | Status: DC
  Filled 2019-01-13 (×5): qty 1

## 2019-01-13 MED ORDER — ISOSORBIDE MONONITRATE ER 30 MG PO TB24: 15.0000 mg | ORAL_TABLET | Freq: Every day | ORAL | Status: DC

## 2019-01-13 MED ORDER — DOCUSATE SODIUM 100 MG PO CAPS: 100.0000 mg | ORAL_CAPSULE | Freq: Every day | ORAL | Status: DC

## 2019-01-13 MED ORDER — STROKE: EARLY STAGES OF RECOVERY BOOK
Freq: Once | Status: DC
  Filled 2019-01-13: qty 1

## 2019-01-13 MED ORDER — SODIUM CHLORIDE 0.9 % IV SOLN
INTRAVENOUS | Status: DC
  Administered 2019-01-14: 06:00:00 via INTRAVENOUS

## 2019-01-13 MED ORDER — POLYETHYLENE GLYCOL 3350 17 G PO PACK: 17.0000 g | PACK | Freq: Every day | ORAL | Status: DC | PRN

## 2019-01-13 MED ORDER — LEVETIRACETAM IN NACL 500 MG/100ML IV SOLN
500.0000 mg | Freq: Two times a day (BID) | INTRAVENOUS | Status: DC
  Administered 2019-01-13 – 2019-01-14 (×2): 500 mg via INTRAVENOUS
  Filled 2019-01-13 (×2): qty 100

## 2019-01-13 MED ORDER — ONDANSETRON HCL 4 MG/2ML IJ SOLN
INTRAMUSCULAR | Status: AC
  Filled 2019-01-13: qty 2

## 2019-01-13 MED ORDER — SCOPOLAMINE 1 MG/3DAYS TD PT72
1.0000 | MEDICATED_PATCH | TRANSDERMAL | Status: DC
  Administered 2019-01-13: 1.5 mg via TRANSDERMAL
  Filled 2019-01-13: qty 1

## 2019-01-13 MED ORDER — VITAMIN D (ERGOCALCIFEROL) 1.25 MG (50000 UNIT) PO CAPS: 50000.0000 [IU] | ORAL_CAPSULE | ORAL | Status: DC

## 2019-01-13 MED ORDER — ACETAMINOPHEN 650 MG RE SUPP: 650.0000 mg | RECTAL | Status: DC | PRN

## 2019-01-13 MED ORDER — FAMOTIDINE 20 MG PO TABS: 20.0000 mg | ORAL_TABLET | Freq: Every day | ORAL | Status: DC

## 2019-01-13 MED ORDER — ACETAMINOPHEN 325 MG PO TABS: 650.0000 mg | ORAL_TABLET | ORAL | Status: DC | PRN

## 2019-01-13 MED ORDER — LORATADINE 10 MG PO TABS: 10.0000 mg | ORAL_TABLET | Freq: Every day | ORAL | Status: DC

## 2019-01-13 MED ORDER — ONDANSETRON HCL 4 MG/2ML IJ SOLN
4.0000 mg | Freq: Four times a day (QID) | INTRAMUSCULAR | Status: DC | PRN
  Administered 2019-01-13: 4 mg via INTRAVENOUS

## 2019-01-13 MED ORDER — LORAZEPAM 2 MG/ML IJ SOLN: 2.0000 mg | INTRAMUSCULAR | Status: DC | PRN

## 2019-01-13 MED ORDER — SACUBITRIL-VALSARTAN 24-26 MG PO TABS: 1.0000 | ORAL_TABLET | Freq: Two times a day (BID) | ORAL | Status: DC

## 2019-01-13 MED ORDER — SODIUM CHLORIDE 0.9 % IV SOLN
INTRAVENOUS | Status: DC
  Administered 2019-01-13: 11:00:00 via INTRAVENOUS

## 2019-01-13 MED ORDER — DIGOXIN 0.0625 MG HALF TABLET: 0.0625 mg | ORAL_TABLET | Freq: Every day | ORAL | Status: DC

## 2019-01-13 MED ORDER — ASPIRIN EC 81 MG PO TBEC: 81.0000 mg | DELAYED_RELEASE_TABLET | Freq: Two times a day (BID) | ORAL | Status: DC

## 2019-01-13 MED ORDER — FERROUS SULFATE 325 (65 FE) MG PO TABS
325.0000 mg | ORAL_TABLET | Freq: Every day | ORAL | Status: DC
  Filled 2019-01-13 (×2): qty 1

## 2019-01-13 NOTE — ED Notes (Signed)
Dr. Domenic Polite paged at this time . lisa

## 2019-01-13 NOTE — ED Provider Notes (Signed)
[facility] EMERGENCY DEPARTMENT Provider Note   CSN: [id number] Arrival date & time: 01/13/19  0932    History   Chief Complaint Chief Complaint  Patient presents with  . Facial Droop    HPI Mary Cook is a 85 y.o. female.     The history is provided by the nursing home and the EMS personnel. The history is limited by the condition of the patient (Hx dementia).    Pt was seen at 0855. Per EMS and NH report: Pt sent from [facility] with "right sided gaze" and "leaning to the left" since yesterday. Has been associated with N/V. Pt states she "feels sick," "nauseated." No reported fevers, cough, known COVID+ exposure. Denies SOB, no abd pain, no diarrhea.    Past Medical History:  Diagnosis Date  . Alzheimer's dementia ([place])   . CAD (coronary artery disease)    a. 04/2010 MI DES x 2 to LCX;  b. 05/2014 Cath: EF 35%, patent LCX stents w/ 70% stenosis in small AV groove LCX, Diag 50-60, RCA 50-60p->Med Rx.  . Cerebral aneurysm   . Chronic combined systolic (congestive) and diastolic (congestive) heart failure ([place])    a. 01/2015 Echo: EF 30-35%, sev inflat HK, Gr 1DD, mild AI.  Marland Kitchen Diverticulosis of colon   . Dysphagia   . Essential hypertension   . Femur fracture ([place])   . Frequent urination   . GIB (gastrointestinal bleeding)    a. 2002 Diverticular bleed.  . Hyperlipidemia   . Ischemic cardiomyopathy    a. 01/2015 Echo: EF 30-35%.  . Muscle weakness   . Osteoarthritis   . Osteopenia   . Pressure ulcer   . Renal disorder   . Seizures ([place])   . Skin disorder   . Sleep apnea    cpap therapy  . Stroke ([place]) 2002  . T12 compression fracture ([place])    a. 04/2015.  Marland Kitchen Thrombus   . Type II diabetes mellitus ([place])   . Visual disorder     Patient Active Problem List   Diagnosis Date Noted  . Pressure injury of skin 06/22/2018  . Seizures ([place]) 06/22/2018  . Seizure-like activity ([place]) 06/19/2018  . Closed intertrochanteric fracture of left femur ([place]) 06/02/2018   . Hip fracture ([place]) 06/01/2018  . HCAP (healthcare-associated pneumonia) 05/22/2018  . Elevated troponin 05/22/2018  . Sinus bradycardia 04/01/2018  . Anemia 03/31/2018  . Traumatic subarachnoid hemorrhage with loss of consciousness of 30 minutes or less ([place])   . Anemia of chronic disease 09/06/2016  . Gastroenteritis 07/22/2016  . Hip pain, chronic 06/03/2016  . Dyspnea 11/15/2015  . Acute respiratory failure with hypoxia ([place]) 11/15/2015  . CHF exacerbation ([place]) 11/15/2015  . Pain in the chest   . Chronic systolic CHF (congestive heart failure) ([place])   . Ischemic cardiomyopathy   . T12 compression fracture ([place])   . Intertrigo 02/28/2015  . Actinic keratoses 02/28/2015  . Noncompliance with diet and medication regimen 02/28/2015  . LBP (low back pain) 02/13/2015  . Acute on chronic renal insufficiency 02/04/2015  . Cardiomyopathy, ischemic-EF 30-35% by echo 01/31/15 02/04/2015  . Chronic kidney disease stage III 02/03/2015  . Chronic combined systolic and diastolic CHF (congestive heart failure) ([place]) 01/30/2015  . Cystitis 10/28/2014  . Insomnia 10/28/2014  . Alzheimer's disease ([place]) 10/28/2014  . Falls frequently 10/12/2014  . Leg weakness, bilateral 10/12/2014  . OSA on CPAP 09/05/2014  . Rapid palpitations 06/07/2014  . Acute chest pain 06/06/2014  . Chest  pain 06/06/2014  . Thoracic back pain after fall  04/13/2013  . Stress at home 01/26/2013  . GERD (gastroesophageal reflux disease) 01/25/2013  . SOB (shortness of breath) 08/28/2011  . Abnormal EKG, marked new ant TWI this adm with negative Troponins,08/18/11 08/20/2011  . CAD S/P CFX PCI Sept 2011 08/18/2011  . Sleep apnea, cpap had been stopped secondary to "sinus issues" 08/18/2011  . [name] deficiency 03/20/2010  . Diabetes mellitus ([place]) 01/07/2008  . Hyperlipidemia 01/07/2008  . Essential hypertension 04/25/2007  . DIVERTICULOSIS, COLON 04/25/2007  . OSTEOARTHRITIS 04/25/2007  . Disorder of bone and  cartilage 04/25/2007  . DIVERTICULITIS, HX OF 04/25/2007    Past Surgical History:  Procedure Laterality Date  . ABDOMINAL HYSTERECTOMY  1980   no bso  . APPENDECTOMY    . CARDIAC CATHETERIZATION  07/2011   LAD OK, D1 80%, CFX 30% w/ patent stent, RCA 50-60%, EF 40%  . CARDIAC CATHETERIZATION    . CORONARY ANGIOPLASTY WITH STENT PLACEMENT  05/11/2010   stent CFX  . DOPPLER ECHOCARDIOGRAPHY    . FEMUR IM NAIL Left 06/02/2018   Procedure: INTRAMEDULLARY (IM) NAIL FEMORAL;  Surgeon: Renette Butters, MD;  Location: [facility] ORS;  Service: Orthopedics;  Laterality: Left;  . LEFT HEART CATHETERIZATION WITH CORONARY ANGIOGRAM N/A 08/18/2011   Procedure: LEFT HEART CATHETERIZATION WITH CORONARY ANGIOGRAM;  Surgeon: Wellington Hampshire, MD;  Location: [facility] CATH LAB;  Service: Cardiovascular;  Laterality: N/A;  . LEFT HEART CATHETERIZATION WITH CORONARY ANGIOGRAM N/A 06/08/2014   Procedure: LEFT HEART CATHETERIZATION WITH CORONARY ANGIOGRAM;  Surgeon: Troy Sine, MD;  Location: [facility] C[facility] LAB;  Service: Cardiovascular;  Laterality: N/A;  . myocardial perfusion study    . THROMBECTOMY    . TONSILLECTOMY       OB History   No obstetric history on file.      Home Medications    Prior to Admission medications   Medication Sig Start Date End Date Taking? Authorizing Provider  acetaminophen (TYLENOL) 325 MG tablet Take 650 mg by mouth 2 (two) times daily.     [provider]  albuterol (PROVENTIL HFA;VENTOLIN HFA) 108 (90 Base) MCG/ACT inhaler Inhale 2 puffs into the lungs every 6 (six) hours as needed for wheezing or shortness of breath. 05/25/18   Kathie Dike, MD  amoxicillin-clavulanate (AUGMENTIN) 500-125 MG tablet Take 1 tablet (500 mg total) by mouth 2 (two) times daily. 06/25/18   Hosie Poisson, MD  aspirin EC 81 MG tablet Take 81 mg by mouth 2 (two) times daily.    [provider]  Blood Glucose Monitoring Suppl (ACCU-CHEK AVIVA PLUS) w/Device KIT Use to check blood sugars  twice a day Dx. E11.9 09/30/16   Plotnikov, Evie Lacks, MD  carvedilol (COREG) 3.125 MG tablet Take 1 tablet (3.125 mg total) by mouth daily. 04/06/18   Barton Dubois, MD  digoxin (LANOXIN) 0.125 MG tablet Take 0.5 tablets (0.0625 mg total) by mouth daily. Patient taking differently: Take 0.125 mg by mouth daily.  05/11/18   Johnson, Clanford L, MD  docusate sodium (COLACE) 100 MG capsule Take 100 mg by mouth at bedtime.    [provider]  ergocalciferol (VITAMIN D2) 50000 units capsule Take 50,000 Units by mouth every Wednesday.    [provider]  famotidine (PEPCID) 10 MG tablet Take 20 mg by mouth daily.    [provider]  ferrous sulfate 325 (65 FE) MG tablet Take 1 tablet (325 mg total) by mouth daily with breakfast. 06/07/18  Shelly Coss, MD  furosemide (LASIX) 20 MG tablet Take 1 tablet (20 mg total) by mouth every other day. 05/28/18   Almyra Deforest, PA  glipiZIDE (GLUCOTROL) 5 MG tablet Take 0.5 tablets (2.5 mg total) by mouth 2 (two) times daily before a meal. 04/06/18 04/06/19  Barton Dubois, MD  guaiFENesin (MUCINEX) 600 MG 12 hr tablet Take 1 tablet (600 mg total) by mouth 2 (two) times daily. 05/25/18 05/25/19  Kathie Dike, MD  guaiFENesin-dextromethorphan (ROBITUSSIN DM) 100-10 MG/5ML syrup Take 5 mLs by mouth 4 (four) times daily as needed for cough.    [provider]  HYDROcodone-acetaminophen (NORCO) 5-325 MG tablet Take 1-2 tablets by mouth every 6 (six) hours as needed for moderate pain. 06/02/18   Prudencio Burly III, PA-C  isosorbide mononitrate (IMDUR) 30 MG 24 hr tablet Take 0.5 tablets (15 mg total) by mouth daily. Patient taking differently: Take 10 mg by mouth 2 (two) times daily.  04/06/18   Barton Dubois, MD  levETIRAcetam (KEPPRA) 500 MG tablet Take 1 tablet (500 mg total) by mouth 2 (two) times daily. 06/22/18   Hosie Poisson, MD  memantine (NAMENDA) 10 MG tablet Take 1 tablet (10 mg total) by mouth 2 (two) times daily. Start  as 5 mg daily x 1 week , then 5 mg twice daily x 1 week, then 5 mg in am and 10 mg at night x 2 weeks and then 10 mg twice daily 07/16/18   Garvin Fila, MD  mirtazapine (REMERON) 15 MG tablet Take 15 mg by mouth at bedtime.  05/08/18   [provider]  polyethylene glycol (MIRALAX / GLYCOLAX) packet Take 17 g by mouth daily as needed for mild constipation. 06/06/18   Shelly Coss, MD  potassium chloride SA (K-DUR,KLOR-CON) 20 MEQ tablet Take 1 tablet (20 mEq total) by mouth every other day. 05/28/18   Almyra Deforest, PA  sacubitril-valsartan (ENTRESTO) 49-51 MG Take 0.5 tablets by mouth 2 (two) times daily. 04/06/18   Barton Dubois, MD    Family History Family History  Problem Relation Age of Onset  . Diabetes Mother   . CAD Mother   . CAD Father   . Hypertension Other     Social History Social History   Tobacco Use  . Smoking status: Never Smoker  . Smokeless tobacco: Never Used  Substance Use Topics  . Alcohol use: No  . Drug use: No     Allergies   Clopidogrel bisulfate; Lipitor [atorvastatin]; Namenda [memantine hcl]; and Lisinopril   Review of Systems Review of Systems  Unable to perform ROS: Dementia     Physical Exam Updated Vital Signs BP (!) 159/53   Pulse (!) 44   Resp 13   Wt 52.6 kg   SpO2 99%   BMI 19.30 kg/m    Patient Vitals for the past 24 hrs:  BP Temp Temp src Pulse Resp SpO2 Weight  01/13/19 1300 (!) 171/64 (!) 95.7 F (35.4 C) - - (!) 9 - -  01/13/19 1245 - (!) 95.7 F (35.4 C) - (!) 55 11 97 % -  01/13/19 1230 (!) 160/59 (!) 95.5 F (35.3 C) - (!) 49 12 99 % -  01/13/19 1215 - (!) 95.2 F (35.1 C) - (!) 49 14 99 % -  01/13/19 1200 (!) 161/69 (!) 95 F (35 C) - (!) 50 14 100 % -  01/13/19 1145 - (!) 95 F (35 C) - (!) 48 - 97 % -  01/13/19 1130 Marland Kitchen)  159/54 (!) 94.8 F (34.9 C) - (!) 50 11 98 % -  01/13/19 1106 - (!) 94.8 F (34.9 C) - (!) 52 11 99 % -  01/13/19 1100 (!) 153/98 (!) 94.3 F (34.6 C) - - 17 - -  01/13/19  1050 - (!) 95.4 F (35.2 C) Rectal - - - -  01/13/19 1050 - (!) 95.4 F (35.2 C) Rectal - - - -  01/13/19 1030 (!) 164/60 - - (!) 53 12 99 % -  01/13/19 1000 (!) 147/58 - - (!) 54 14 100 % -  01/13/19 0930 (!) 174/54 - - (!) 53 16 99 % -  01/13/19 0900 (!) 159/53 - - (!) 44 13 99 % -  01/13/19 0856 (!) 160/54 - - (!) 54 16 99 % -  01/13/19 0847 - - - - - - 52.6 kg     Physical Exam 0900: Physical examination:  Nursing notes reviewed; Vital signs and O2 SAT reviewed;  Constitutional: Well developed, Well nourished, in no acute distress; Head:  Normocephalic, atraumatic; Eyes: EOMI, +bilat pupils small with right gaze. No scleral icterus; ENMT: Mouth and pharynx normal, Mucous membranes dry; Neck: Supple, Full range of motion, No lymphadenopathy; Cardiovascular: Regular rate and rhythm, No gallop; Respiratory: Breath sounds clear & equal bilaterally, No wheezes.  Speaking full sentences with ease, Normal respiratory effort/excursion; Chest: Nontender, Movement normal; Abdomen: +dried vomit on bedsheets on arrival. Soft, Nontender, Nondistended, Normal bowel sounds; Genitourinary: No CVA tenderness; Extremities: Peripheral pulses normal, No tenderness, No edema, No calf edema or asymmetry.; Neuro: Awake, alert. Head and eyes turned right. Speech mumbling. Leaning left. Grips R>L. Strength 3/5 RUE. Pt will move LUE on stretcher, and will bend knees on stretcher only, otherwise will not hold either leg or LUE up off stretcher.;; Skin: Color normal, Warm, Dry.   ED Treatments / Results  Labs (all labs ordered are listed, but only abnormal results are displayed)   EKG EKG Interpretation  Date/Time:  Wednesday Jan 13 2019 08:52:57 EDT Ventricular Rate:  54 PR Interval:    QRS Duration: 165 QT Interval:  483 QTC Calculation: 458 R Axis:   -33 Text Interpretation:  Sinus rhythm Left bundle branch block Left axis deviation Artifact When compared with ECG of 06/19/2018 No significant change was  found Confirmed by Francine Graven [phone]) on 01/13/2019 10:03:11 AM   Radiology   Procedures Procedures (including critical care time)  Medications Ordered in ED Medications - No data to display   Initial Impression / Assessment and Plan / ED Course  I have reviewed the triage vital signs and the nursing notes.  Pertinent labs & imaging results that were available during my care of the patient were reviewed by me and considered in my medical decision making (see chart for details).       MDM Reviewed: previous chart, nursing note and vitals Reviewed previous: labs and ECG Interpretation: labs, ECG, x-ray and CT scan   Results for orders placed or performed during the hospital encounter of 01/13/19  SARS Coronavirus 2 (CEPHEID - Performed in [facility] hospital lab), [facility] Order  Result Value Ref Range   SARS Coronavirus 2 NEGATIVE NEGATIVE  Culture, blood (routine x 2)  Result Value Ref Range   Specimen Description BLOOD RIGHT ARM    Special Requests      BOTTLES DRAWN AEROBIC AND ANAEROBIC Blood Culture results may not be optimal due to an inadequate volume of blood received in culture bottles Performed at [facility]  [facility], [address], [place], [place] [address]    Culture PENDING    Report Status PENDING   Culture, blood (routine x 2)  Result Value Ref Range   Specimen Description BLOOD RIGHT HAND    Special Requests      BOTTLES DRAWN AEROBIC ONLY Blood Culture results may not be optimal due to an inadequate volume of blood received in culture bottles Performed at [facility], [address] [place], [place] [address]    Culture PENDING    Report Status PENDING   Ethanol  Result Value Ref Range   Alcohol, Ethyl (B) <10 <10 mg/dL  Protime-INR  Result Value Ref Range   Prothrombin Time 13.3 11.4 - 15.2 seconds   INR 1.0 0.8 - 1.2  APTT  Result Value Ref Range   aPTT 29 24 - 36 seconds  CBC  Result Value Ref Range   WBC 5.6 4.0 - 10.5 K/uL   RBC 3.22  (L) 3.87 - 5.11 MIL/uL   Hemoglobin 10.2 (L) 12.0 - 15.0 g/dL   HCT 31.2 (L) 36.0 - 46.0 %   MCV 96.9 80.0 - 100.0 fL   MCH 31.7 26.0 - 34.0 pg   MCHC 32.7 30.0 - 36.0 g/dL   RDW 11.4 (L) 11.5 - 15.5 %   Platelets 145 (L) 150 - 400 K/uL   nRBC 0.0 0.0 - 0.2 %  Differential  Result Value Ref Range   Neutrophils Relative % 63 %   Neutro Abs 3.5 1.7 - 7.7 K/uL   Lymphocytes Relative 25 %   Lymphs Abs 1.4 0.7 - 4.0 K/uL   Monocytes Relative 8 %   Monocytes Absolute 0.4 0.1 - 1.0 K/uL   Eosinophils Relative 3 %   Eosinophils Absolute 0.2 0.0 - 0.5 K/uL   Basophils Relative 1 %   Basophils Absolute 0.0 0.0 - 0.1 K/uL   Immature Granulocytes 0 %   Abs Immature Granulocytes 0.01 0.00 - 0.07 K/uL  Comprehensive metabolic panel  Result Value Ref Range   Sodium 141 135 - 145 mmol/L   Potassium 3.6 3.5 - 5.1 mmol/L   Chloride 107 98 - 111 mmol/L   CO2 25 22 - 32 mmol/L   Glucose, Bld 174 (H) 70 - 99 mg/dL   BUN 31 (H) 8 - 23 mg/dL   Creatinine, Ser 1.55 (H) 0.44 - 1.00 mg/dL   Calcium 9.3 8.9 - 10.3 mg/dL   Total Protein 6.2 (L) 6.5 - 8.1 g/dL   Albumin 3.2 (L) 3.5 - 5.0 g/dL   AST 20 15 - 41 U/L   ALT 15 0 - 44 U/L   Alkaline Phosphatase 55 38 - 126 U/L   Total Bilirubin 0.4 0.3 - 1.2 mg/dL   GFR calc non Af Amer 29 (L) >60 mL/min   GFR calc Af Amer 34 (L) >60 mL/min   Anion gap 9 5 - 15  Urine rapid drug screen (hosp performed)not at [facility]  Result Value Ref Range   Opiates NONE DETECTED NONE DETECTED   Cocaine NONE DETECTED NONE DETECTED   Benzodiazepines NONE DETECTED NONE DETECTED   Amphetamines NONE DETECTED NONE DETECTED   Tetrahydrocannabinol NONE DETECTED NONE DETECTED   Barbiturates NONE DETECTED NONE DETECTED  Urinalysis, Routine w reflex microscopic  Result Value Ref Range   Color, Urine STRAW (A) YELLOW   APPearance CLEAR CLEAR   Specific Gravity, Urine 1.009 1.005 - 1.030   pH 6.0 5.0 - 8.0   Glucose, UA NEGATIVE NEGATIVE mg/dL  Hgb urine dipstick NEGATIVE  NEGATIVE   Bilirubin Urine NEGATIVE NEGATIVE   Ketones, ur NEGATIVE NEGATIVE mg/dL   Protein, ur NEGATIVE NEGATIVE mg/dL   Nitrite NEGATIVE NEGATIVE   Leukocytes,Ua NEGATIVE NEGATIVE  Troponin I - ONCE - STAT  Result Value Ref Range   Troponin I 0.24 (HH) <0.03 ng/mL  Lipase, blood  Result Value Ref Range   Lipase 31 11 - 51 U/L  Lactic acid, plasma  Result Value Ref Range   Lactic Acid, Venous 0.9 0.5 - 1.9 mmol/L  Lactic acid, plasma  Result Value Ref Range   Lactic Acid, Venous 1.4 0.5 - 1.9 mmol/L   Ct Head Wo Contrast Result Date: 01/13/2019 CLINICAL DATA:  History of stroke, now with right-sided facial droop and gaze EXAM: CT HEAD WITHOUT CONTRAST TECHNIQUE: Contiguous axial images were obtained from the base of the skull through the vertex without intravenous contrast. COMPARISON:  06/19/2018 FINDINGS: Brain: Similar findings of advanced atrophy with sulcal prominence with centralized volume loss and commensurate ex vacuo dilatation of the ventricular system. Redemonstrated rather extensive periventricular hypodensities compatible with microvascular ischemic disease. Encephalomalacia involving the posterior aspect of the right parietal lobe is unchanged with associated ex vacuo dilatation of the right lateral ventricle (representative image 14, series 2). Unchanged sizable lacunar infarct within the right basal ganglia (image 11, series 2). Unchanged encephalomalacia involving the left cerebellar hemisphere (image 5, series 2). Given extensive background parenchymal abnormalities, there is no CT evidence of acute large territory infarct. No intraparenchymal or extra-axial mass or hemorrhage. Unchanged size and configuration of the ventricles and the basilar cisterns. No midline shift. Vascular: Intracranial atherosclerosis. Skull: No displaced calvarial fracture. Sinuses/Orbits: There is underpneumatization the bilateral frontal sinuses. The remaining paranasal sinuses and mastoid air  cells are normally aerated. No air-fluid levels. Other: Regional soft tissues are normal. IMPRESSION: 1. No acute intracranial process. 2. Similar findings of advanced atrophy, microvascular ischemic disease, right posterior parietal and left cerebellar infarcts as detailed above. Electronically Signed   By: Sandi Mariscal M.D.   On: 01/13/2019 12:33   Dg Chest Port 1 View Result Date: 01/13/2019 CLINICAL DATA:  Nausea and vomiting EXAM: PORTABLE CHEST 1 VIEW COMPARISON:  June 19, 2018 FINDINGS: There is no appreciable edema or consolidation. Heart is borderline enlarged with pulmonary vascularity normal. There is aortic atherosclerosis as well as mitral annular calcification. No adenopathy. There is degenerative change in each shoulder. IMPRESSION: Chronic cardiac prominence. No edema or consolidation. Aortic Atherosclerosis (ICD10-I70.0). Electronically Signed   By: Lowella Grip III M.D.   On: 01/13/2019 09:52     1305:  H/H per baseline. BUN/Cr mildly elevated and pt did not pass bedside swallow screen; judicious IVF started. Pt hypothermic via rectal temp; warming blanket placed. BC and UC obtained. Pt otherwise not code sepsis at this time, given normal WBC count and normal lactic acid x2.  Troponin elevated but EKG unchanged from previous. Pt has not had any N/V while in the ED. T/C returned from [facility] Dr. Domenic Polite, case discussed, including:  HPI, pertinent PM/SHx, VS/PE, dx testing, ED course and treatment:  Agrees no acute intervention needed at this time, admit, will need echocardiogram and trend troponins.   1330:  CT-H with old strokes. MRI brain pending. Will need admit. T/C returned from [facility] Dr. Manuella Ghazi, case discussed, including:  HPI, pertinent PM/SHx, VS/PE, dx testing, ED course and treatment, as well as d/w Cards MD:  Agreeable to admit.     Final Clinical Impressions(s) / ED  Diagnoses   Final diagnoses:  Nausea & vomiting    ED Discharge Orders    None       Francine Graven, DO 01/16/19 1638

## 2019-01-13 NOTE — Progress Notes (Signed)
Unable to complete all admission questions related to patients condition.

## 2019-01-13 NOTE — H&P (Signed)
°History and Physical  ° ° °Mary Cook MRN:[id number] DOB: 05/09/1926 DOA: 01/13/2019 ° °PCP: Fanta, Tesfaye, MD  ° °Patient coming from: [facility] ° °Chief Complaint: Facial droop and R sided gaze ° °HPI: Mary Cook is a 85 y.o. female with medical history significant for dementia, hypertension, CVA, CAD status post stent in 2011 and in 2015, combined systolic and diastolic CHF, OSA on CPAP, prior left hip fracture with ORIF, anemia of chronic disease, history of seizures, and CKD stage III who presented to the emergency department from her skilled facility with noted right-sided gaze and some leftward leaning that was noted since yesterday.  She has also had some episodes of nausea and vomiting.  The patient reports that she feels sick and nauseated, but there are no other descriptions of chest pain, shortness of breath, abdominal pain, or diarrhea.  She was noted to have some emesis on her upon arrival to the emergency department. °  °ED Course: Vital signs are stable, but with some hypothermia and temperature 95.7 °F.  Laboratory data reveals creatinine 1.55 with baseline of 1.49 in the context of CKD stage III.  Troponin at 0.24.  EKG with chronic left bundle branch block noted.  No signs of UTI and urine analysis noted.  CT of the head with no acute findings aside from stable atrophy and microvascular disease.  EDP has discussed with Dr. McDowell of cardiology regarding troponin elevation and there would be no further evaluation aside from 2D echocardiogram at this time given advanced age and patient condition.  MRI report is currently pending. ° °Review of Systems: Cannot be obtained given patient condition. ° °Past Medical History:  °Diagnosis Date  °• Alzheimer's dementia (HCC)   °• CAD (coronary artery disease)   ° a. 04/2010 MI DES x 2 to LCX;  b. 05/2014 Cath: EF 35%, patent LCX stents w/ 70% stenosis in small AV groove LCX, Diag 50-60, RCA 50-60p->Med Rx.  °• Cerebral aneurysm   °•  Chronic combined systolic (congestive) and diastolic (congestive) heart failure (HCC)   ° a. 01/2015 Echo: EF 30-35%, sev inflat HK, Gr 1DD, mild AI.  °• Diverticulosis of colon   °• Dysphagia   °• Essential hypertension   °• Femur fracture (HCC)   °• Frequent urination   °• GIB (gastrointestinal bleeding)   ° a. 2002 Diverticular bleed.  °• Hyperlipidemia   °• Ischemic cardiomyopathy   ° a. 01/2015 Echo: EF 30-35%.  °• Muscle weakness   °• Osteoarthritis   °• Osteopenia   °• Pressure ulcer   °• Renal disorder   °• Seizures (HCC)   °• Skin disorder   °• Sleep apnea   ° cpap therapy  °• Stroke (HCC) 2002  °• T12 compression fracture (HCC)   ° a. 04/2015.  °• Thrombus   °• Type II diabetes mellitus (HCC)   °• Visual disorder   ° ° °Past Surgical History:  °Procedure Laterality Date  °• ABDOMINAL HYSTERECTOMY  1980  ° no bso  °• APPENDECTOMY    °• CARDIAC CATHETERIZATION  07/2011  ° LAD OK, D1 80%, CFX 30% w/ patent stent, RCA 50-60%, EF 40%  °• CARDIAC CATHETERIZATION    °• CORONARY ANGIOPLASTY WITH STENT PLACEMENT  05/11/2010  ° stent CFX  °• DOPPLER ECHOCARDIOGRAPHY    °• FEMUR IM NAIL Left 06/02/2018  ° Procedure: INTRAMEDULLARY (IM) NAIL FEMORAL;  Surgeon: Murphy, Timothy D, MD;  Location: [facility];  Service: Orthopedics;  Laterality: Left;  °• LEFT   HEART CATHETERIZATION WITH CORONARY ANGIOGRAM N/A 08/18/2011  ° Procedure: LEFT HEART CATHETERIZATION WITH CORONARY ANGIOGRAM;  Surgeon: Muhammad A Arida, MD;  Location: [facility];  Service: Cardiovascular;  Laterality: N/A;  °• LEFT HEART CATHETERIZATION WITH CORONARY ANGIOGRAM N/A 06/08/2014  ° Procedure: LEFT HEART CATHETERIZATION WITH CORONARY ANGIOGRAM;  Surgeon: Thomas A Kelly, MD;  Location: [facility];  Service: Cardiovascular;  Laterality: N/A;  °• myocardial perfusion study    °• THROMBECTOMY    °• TONSILLECTOMY    ° ° ° reports that she has never smoked. She has never used smokeless tobacco. She reports that she does not drink alcohol or use drugs. ° °Allergies    °Allergen Reactions  °• Clopidogrel Bisulfate Nausea And Vomiting  °  Patient is not aware of this allergy  °• Lipitor [Atorvastatin] Other (See Comments)  °  Weak legs  °• Namenda [Memantine Hcl]   °  N/v/d  °• Lisinopril Cough  °  Patient is not aware of this allergy  ° ° °Family History  °Problem Relation Age of Onset  °• Diabetes Mother   °• CAD Mother   °• CAD Father   °• Hypertension Other   ° ° °Prior to Admission medications   °Medication Sig Start Date End Date Taking? Authorizing Provider  °acetaminophen (TYLENOL) 325 MG tablet Take 650 mg by mouth 2 (two) times daily.    Yes [provider]  °albuterol (PROVENTIL HFA;VENTOLIN HFA) 108 (90 Base) MCG/ACT inhaler Inhale 2 puffs into the lungs every 6 (six) hours as needed for wheezing or shortness of breath. 05/25/18  Yes Memon, Jehanzeb, MD  °aspirin EC 81 MG tablet Take 81 mg by mouth 2 (two) times daily.   Yes [provider]  °Blood Glucose Monitoring Suppl (ACCU-CHEK AVIVA PLUS) w/Device KIT Use to check blood sugars twice a day Dx. E11.9 09/30/16  Yes Plotnikov, Aleksei V, MD  °carvedilol (COREG) 3.125 MG tablet Take 1 tablet (3.125 mg total) by mouth daily. 04/06/18  Yes Madera, Carlos, MD  °Digoxin 62.5 MCG TABS Take 1 tablet by mouth daily. Hold is pulse is under 60   Yes [provider]  °divalproex (DEPAKOTE) 125 MG DR tablet Take 1 tablet by mouth 3 (three) times daily. 12/01/18  Yes [provider]  °docusate sodium (COLACE) 100 MG capsule Take 100 mg by mouth at bedtime.   Yes [provider]  °ergocalciferol (VITAMIN D2) 50000 units capsule Take 50,000 Units by mouth every Wednesday.   Yes [provider]  °famotidine (PEPCID) 10 MG tablet Take 20 mg by mouth daily.   Yes [provider]  °ferrous sulfate 325 (65 FE) MG tablet Take 1 tablet (325 mg total) by mouth daily with breakfast. 06/07/18  Yes Adhikari, Amrit, MD  °furosemide (LASIX) 20 MG tablet Take 1 tablet (20 mg total) by  mouth every other day. 05/28/18  Yes Meng, Hao, PA  °guaiFENesin (MUCINEX) 600 MG 12 hr tablet Take 1 tablet (600 mg total) by mouth 2 (two) times daily. 05/25/18 05/25/19 Yes Memon, Jehanzeb, MD  °guaiFENesin-dextromethorphan (ROBITUSSIN DM) 100-10 MG/5ML syrup Take 5 mLs by mouth 4 (four) times daily as needed for cough.   Yes [provider]  °isosorbide mononitrate (IMDUR) 30 MG 24 hr tablet Take 0.5 tablets (15 mg total) by mouth daily. 04/06/18  Yes Madera, Carlos, MD  °levETIRAcetam (KEPPRA) 500 MG tablet Take 1 tablet (500 mg total) by mouth 2 (two) times daily. °Patient taking differently: Take 1,000 mg by   mouth 2 (two) times daily.  06/22/18  Yes Akula, Vijaya, MD  °loratadine (CLARITIN) 10 MG tablet Take 10 mg by mouth daily.   Yes [provider]  °mirtazapine (REMERON) 15 MG tablet Take 22.5 mg by mouth at bedtime. Take 1 and 1/2 tabs at bedtime. 05/08/18  Yes [provider]  °polyethylene glycol (MIRALAX / GLYCOLAX) packet Take 17 g by mouth daily as needed for mild constipation. 06/06/18  Yes Adhikari, Amrit, MD  °potassium chloride SA (K-DUR,KLOR-CON) 20 MEQ tablet Take 1 tablet (20 mEq total) by mouth every other day. 05/28/18  Yes Meng, Hao, PA  °sacubitril-valsartan (ENTRESTO) 49-51 MG Take 0.5 tablets by mouth 2 (two) times daily. 04/06/18  Yes Madera, Carlos, MD  °digoxin (LANOXIN) 0.125 MG tablet Take 0.5 tablets (0.0625 mg total) by mouth daily. °Patient not taking: Reported on 01/13/2019 05/11/18   Johnson, Clanford L, MD  °glipiZIDE (GLUCOTROL) 5 MG tablet Take 0.5 tablets (2.5 mg total) by mouth 2 (two) times daily before a meal. °Patient not taking: Reported on 01/13/2019 04/06/18 04/06/19  Madera, Carlos, MD  °HYDROcodone-acetaminophen (NORCO) 5-325 MG tablet Take 1-2 tablets by mouth every 6 (six) hours as needed for moderate pain. °Patient not taking: Reported on 01/13/2019 06/02/18   Martensen, Henry Calvin III, PA-C  °memantine (NAMENDA) 10 MG tablet Take 1 tablet (10 mg  total) by mouth 2 (two) times daily. Start as 5 mg daily x 1 week , then 5 mg twice daily x 1 week, then 5 mg in am and 10 mg at night x 2 weeks and then 10 mg twice daily °Patient not taking: Reported on 01/13/2019 07/16/18   Sethi, Pramod S, MD  ° ° °Physical Exam: °Vitals:  ° 01/13/19 1215 01/13/19 1230 01/13/19 1245 01/13/19 1300  °BP:  (!) 160/59  (!) 171/64  °Pulse: (!) 49 (!) 49 (!) 55   °Resp: 14 12 11 (!) 9  °Temp: (!) 95.2 °F (35.1 °C) (!) 95.5 °F (35.3 °C) (!) 95.7 °F (35.4 °C) (!) 95.7 °F (35.4 °C)  °TempSrc:      °SpO2: 99% 99% 97%   °Weight:      ° ° °Constitutional: NAD, calm, comfortable °Vitals:  ° 01/13/19 1215 01/13/19 1230 01/13/19 1245 01/13/19 1300  °BP:  (!) 160/59  (!) 171/64  °Pulse: (!) 49 (!) 49 (!) 55   °Resp: 14 12 11 (!) 9  °Temp: (!) 95.2 °F (35.1 °C) (!) 95.5 °F (35.3 °C) (!) 95.7 °F (35.4 °C) (!) 95.7 °F (35.4 °C)  °TempSrc:      °SpO2: 99% 99% 97%   °Weight:      ° °Eyes: lids and conjunctivae normal °ENMT: Mucous membranes are moist.  °Neck: normal, supple °Respiratory: clear to auscultation bilaterally. Normal respiratory effort. No accessory muscle use.  °Cardiovascular: Regular rate and rhythm, no murmurs. No extremity edema. °Abdomen: no tenderness, no distention. Bowel sounds positive.  °Musculoskeletal:  No joint deformity upper and lower extremities.   °Skin: no rashes, lesions, ulcers.  °Psychiatric: Cannot be assessed given patient condition. ° °Labs on Admission: I have personally reviewed following labs and imaging studies ° °CBC: °Recent Labs  °Lab 01/13/19 °1019  °WBC 5.6  °NEUTROABS 3.5  °HGB 10.2*  °HCT 31.2*  °MCV 96.9  °PLT 145*  ° °Basic Metabolic Panel: °Recent Labs  °Lab 01/13/19 °1019  °NA 141  °K 3.6  °CL 107  °CO2 25  °GLUCOSE 174*  °BUN 31*  °CREATININE 1.55*  °CALCIUM 9.3  ° °GFR: °CrCl   cannot be calculated (Unknown ideal weight.). °Liver Function Tests: °Recent Labs  °Lab 01/13/19 °1019  °AST 20  °ALT 15  °ALKPHOS 55  °BILITOT 0.4  °PROT 6.2*  °ALBUMIN 3.2*   ° °Recent Labs  °Lab 01/13/19 °1019  °LIPASE 31  ° °No results for input(s): AMMONIA in the last 168 hours. °Coagulation Profile: °Recent Labs  °Lab 01/13/19 °1019  °INR 1.0  ° °Cardiac Enzymes: °Recent Labs  °Lab 01/13/19 °1019  °TROPONINI 0.24*  ° °BNP (last 3 results) °No results for input(s): PROBNP in the last 8760 hours. °HbA1C: °No results for input(s): HGBA1C in the last 72 hours. °CBG: °No results for input(s): GLUCAP in the last 168 hours. °Lipid Profile: °No results for input(s): CHOL, HDL, LDLCALC, TRIG, CHOLHDL, LDLDIRECT in the last 72 hours. °Thyroid Function Tests: °No results for input(s): TSH, T4TOTAL, FREET4, T3FREE, THYROIDAB in the last 72 hours. °Anemia Panel: °No results for input(s): VITAMINB12, FOLATE, FERRITIN, TIBC, IRON, RETICCTPCT in the last 72 hours. °Urine analysis: °   °Component Value Date/Time  ° COLORURINE STRAW (A) 01/13/2019 0924  ° APPEARANCEUR CLEAR 01/13/2019 0924  ° LABSPEC 1.009 01/13/2019 0924  ° PHURINE 6.0 01/13/2019 0924  ° GLUCOSEU NEGATIVE 01/13/2019 0924  ° GLUCOSEU NEGATIVE 10/12/2014 1053  ° HGBUR NEGATIVE 01/13/2019 0924  ° BILIRUBINUR NEGATIVE 01/13/2019 0924  ° KETONESUR NEGATIVE 01/13/2019 0924  ° PROTEINUR NEGATIVE 01/13/2019 0924  ° UROBILINOGEN 1.0 04/28/2015 1658  ° NITRITE NEGATIVE 01/13/2019 0924  ° LEUKOCYTESUR NEGATIVE 01/13/2019 0924  ° ° °Radiological Exams on Admission: °Ct Head Wo Contrast ° °Result Date: 01/13/2019 °CLINICAL DATA:  History of stroke, now with right-sided facial droop and gaze EXAM: CT HEAD WITHOUT CONTRAST TECHNIQUE: Contiguous axial images were obtained from the base of the skull through the vertex without intravenous contrast. COMPARISON:  06/19/2018 FINDINGS: Brain: Similar findings of advanced atrophy with sulcal prominence with centralized volume loss and commensurate ex vacuo dilatation of the ventricular system. Redemonstrated rather extensive periventricular hypodensities compatible with microvascular ischemic disease.  Encephalomalacia involving the posterior aspect of the right parietal lobe is unchanged with associated ex vacuo dilatation of the right lateral ventricle (representative image 14, series 2). Unchanged sizable lacunar infarct within the right basal ganglia (image 11, series 2). Unchanged encephalomalacia involving the left cerebellar hemisphere (image 5, series 2). Given extensive background parenchymal abnormalities, there is no CT evidence of acute large territory infarct. No intraparenchymal or extra-axial mass or hemorrhage. Unchanged size and configuration of the ventricles and the basilar cisterns. No midline shift. Vascular: Intracranial atherosclerosis. Skull: No displaced calvarial fracture. Sinuses/Orbits: There is underpneumatization the bilateral frontal sinuses. The remaining paranasal sinuses and mastoid air cells are normally aerated. No air-fluid levels. Other: Regional soft tissues are normal. IMPRESSION: 1. No acute intracranial process. 2. Similar findings of advanced atrophy, microvascular ischemic disease, right posterior parietal and left cerebellar infarcts as detailed above. Electronically Signed   By: John  Watts M.D.   On: 01/13/2019 12:33  ° °Dg Chest Port 1 View ° °Result Date: 01/13/2019 °CLINICAL DATA:  Nausea and vomiting EXAM: PORTABLE CHEST 1 VIEW COMPARISON:  June 19, 2018 FINDINGS: There is no appreciable edema or consolidation. Heart is borderline enlarged with pulmonary vascularity normal. There is aortic atherosclerosis as well as mitral annular calcification. No adenopathy. There is degenerative change in each shoulder. IMPRESSION: Chronic cardiac prominence. No edema or consolidation. Aortic Atherosclerosis (ICD10-I70.0). Electronically Signed   By: William  Woodruff III M.D.   On: 01/13/2019 09:52  ° ° °EKG: Independently   Independently reviewed. SR 54bpm with LBBB.  Assessment/Plan Principal Problem:   Acute encephalopathy Active Problems:   CAD S/P CFX PCI Sept 2011   OSA on  CPAP   Alzheimer's disease ([place])   Chronic combined systolic and diastolic CHF (congestive heart failure) (HCC)   Chronic kidney disease stage III   Elevated troponin   Seizures ([place])    1. Acute encephalopathy.  Suspect secondary to seizures versus CVA.  Patient noted to be on Depakote and Keppra and will check levels and consult neurology.  EEG evaluation.  MRI brain pending.  CT head with no acute findings.  Further CV evaluation per order set to include 2D echocardiogram as noted below. 2. Elevated troponin in context of prior CAD.  May potentially be related to CVA.  Will assess further with 2D echocardiogram.  Cardiology does not recommend trend at this time as this will not change further management.  Continue to monitor on telemetry.  No EKG changes otherwise noted.  Maintain on aspirin, Coreg, and Imdur. 3. CKD stage III.  Currently at baseline.  Continue to monitor with repeat lab work. 4. Alzheimer's dementia.  Patient apparently has legal guardian and will attempt contact.  Maintain on Namenda. 5. Chronic combined systolic and diastolic CHF.  Monitor daily weights.  Currently at baseline with no orthopnea or shortness of breath noted.  No volume overload. 6. OSA on CPAP.  Maintain on home CPAP at night.   DVT prophylaxis: Heparin Code Status: DNR Family Communication: Will call family Disposition Plan:CVA evaluation Consults called:EDP discussed with Cardiology Admission status: Obs, Tele   Jill Stopka Darleen Crocker DO [facility] Pager [phone]  If 7PM-7AM, please contact night-coverage [email or web address] Password [facility]  01/13/2019, 2:32 PM

## 2019-01-13 NOTE — ED Triage Notes (Signed)
According to note sent from facility, pt's eyes fixed to right, answers appropriately, leaning to left, feels sick, and nauseated.

## 2019-01-13 NOTE — Progress Notes (Signed)
CSW attempted to contact pt's legal guardian to fullfill  observation notice. CSW was unable to speak with Ms. Marinda Elk and left VM requesting call back.   Will speak with Pt nurse to assess for her ability to receive observation notice.

## 2019-01-13 NOTE — Care Management Obs Status (Signed)
[place] NOTIFICATION   Patient Details  Name: Mary Cook MRN: [id number] Date of Birth: 08/19/1926   Medicare Observation Status Notification Given:  Other (see comment)(Notice provided in pt's room to be given to family upon discharge)    [place], LCSW 01/13/2019, 10:56 PM

## 2019-01-13 NOTE — ED Notes (Signed)
Date and time results received: 01/13/19 1046 (use smartphrase ".now" to insert current time)  Test: troponin Critical Value: 0.24  Name of Provider Notified: Thurnell Garbe MD  Orders Received? Or Actions Taken?: n/a

## 2019-01-13 NOTE — ED Notes (Signed)
Patient transported to MRI 

## 2019-01-13 NOTE — ED Triage Notes (Signed)
EMS reports pt resident of [facility] and says staff told them they noticed pt has r facial droop and r sided gaze since some time yesterday.  Pt verbal but drowsy.  Pt followed commands with ems.  Reports both grips strong and equal.  Reports HR 50.  CBG 148.

## 2019-01-14 ENCOUNTER — Observation Stay (HOSPITAL_COMMUNITY): Payer: Medicare HMO

## 2019-01-14 ENCOUNTER — Other Ambulatory Visit (HOSPITAL_COMMUNITY): Payer: Medicare HMO

## 2019-01-14 DIAGNOSIS — G9340 Encephalopathy, unspecified: Secondary | ICD-10-CM

## 2019-01-14 DIAGNOSIS — R0902 Hypoxemia: Secondary | ICD-10-CM | POA: Diagnosis not present

## 2019-01-14 LAB — URINE CULTURE: Culture: NO GROWTH

## 2019-01-14 LAB — MRSA PCR SCREENING: MRSA by PCR: NEGATIVE

## 2019-01-14 LAB — HEMOGLOBIN A1C
Hgb A1c MFr Bld: 5.5 % (ref 4.8–5.6)
Mean Plasma Glucose: 111.15 mg/dL

## 2019-01-15 LAB — LEVETIRACETAM LEVEL: Levetiracetam Lvl: 46.5 ug/mL — ABNORMAL HIGH (ref 10.0–40.0)

## 2019-01-18 LAB — CULTURE, BLOOD (ROUTINE X 2)
Culture: NO GROWTH
Culture: NO GROWTH

## 2019-01-25 NOTE — Discharge Summary (Signed)
Physician Discharge Summary  Mary Cook LGX:[id number] DOB: 09/13/1925 DOA: 02/05/19  PCP: Rosita Fire, MD  Admit date: 2019-02-05  Death date: 02/06/19 0632  Admitted From: SNF  Disposition:  Expired   Brief/Interim Summary: Per HPI: Mary Cook is a 85 y.o. female with medical history significant for dementia, hypertension, CVA, CAD status post stent in 12/10/09 and in 8144, combined systolic and diastolic CHF, OSA on CPAP, prior left hip fracture with ORIF, anemia of chronic disease, history of seizures, and CKD stage III who presented to the emergency department from her skilled facility with noted right-sided gaze and some leftward leaning that was noted since yesterday.  She has also had some episodes of nausea and vomiting.  The patient reports that she feels sick and nauseated, but there are no other descriptions of chest pain, shortness of breath, abdominal pain, or diarrhea.  She was noted to have some emesis on her upon arrival to the emergency department.  Patient was admitted with large left cerebellar CVA which was noted on MRI.  She unfortunately began to have profound secretions, respiratory distress, and hypoxemia overnight.  She cannot be placed on BiPAP on account of her secretions.  Her clinical status continued to worsen through the evening and patient went into respiratory arrest with asystole.  Family members were contacted multiple times by nursing staff with no success.  I, as the attending physician, had tried to contact patient's legal guardian on 02/05/23 as well with no success.  Patient ultimately expired at 0632 on 2019-02-06.   Discharge Diagnoses:  Principal Problem:   Acute encephalopathy Active Problems:   CAD S/P CFX PCI Sept 2011   OSA on CPAP   Alzheimer's disease ([place])   Chronic combined systolic and diastolic CHF (congestive heart failure) (HCC)   Chronic kidney disease stage III   Elevated troponin   Seizures (HCC)     Allergies   Allergen Reactions  . Clopidogrel Bisulfate Nausea And Vomiting    Patient is not aware of this allergy  . Lipitor [Atorvastatin] Other (See Comments)    Weak legs  . Namenda [Memantine Hcl]     N/v/d  . Lisinopril Cough    Patient is not aware of this allergy    Consultations:  Neurology  Palliative care   Procedures/Studies: Dg Abd 1 View  Result Date: 02-05-19 CLINICAL DATA:  Nausea and vomiting. History of dementia. History of appendectomy. EXAM: ABDOMEN - 1 VIEW COMPARISON:  None. FINDINGS: There is an above average amount of stool in the colon. The bowel gas pattern is nonobstructive. There is a large amount of stool in the rectum measuring approximately 9.5 by 9 cm. Degenerative changes are noted of the lumbar spine. The patient is status post remote ORIF of the left femur. IMPRESSION: Large rectal stool ball. Above average amount of stool in the colon. Nonobstructive bowel gas pattern. Electronically Signed   By: Constance Holster M.D.   On: 05-Feb-2019 18:04   Ct Head Wo Contrast  Result Date: 05-Feb-2019 CLINICAL DATA:  History of stroke, now with right-sided facial droop and gaze EXAM: CT HEAD WITHOUT CONTRAST TECHNIQUE: Contiguous axial images were obtained from the base of the skull through the vertex without intravenous contrast. COMPARISON:  06/19/2018 FINDINGS: Brain: Similar findings of advanced atrophy with sulcal prominence with centralized volume loss and commensurate ex vacuo dilatation of the ventricular system. Redemonstrated rather extensive periventricular hypodensities compatible with microvascular ischemic disease. Encephalomalacia involving the posterior aspect of the right parietal lobe  is unchanged with associated ex vacuo dilatation of the right lateral ventricle (representative image 14, series 2). Unchanged sizable lacunar infarct within the right basal ganglia (image 11, series 2). Unchanged encephalomalacia involving the left cerebellar hemisphere (image  5, series 2). Given extensive background parenchymal abnormalities, there is no CT evidence of acute large territory infarct. No intraparenchymal or extra-axial mass or hemorrhage. Unchanged size and configuration of the ventricles and the basilar cisterns. No midline shift. Vascular: Intracranial atherosclerosis. Skull: No displaced calvarial fracture. Sinuses/Orbits: There is underpneumatization the bilateral frontal sinuses. The remaining paranasal sinuses and mastoid air cells are normally aerated. No air-fluid levels. Other: Regional soft tissues are normal. IMPRESSION: 1. No acute intracranial process. 2. Similar findings of advanced atrophy, microvascular ischemic disease, right posterior parietal and left cerebellar infarcts as detailed above. Electronically Signed   By: Sandi Mariscal M.D.   On: 01/13/2019 12:33   Mr Brain Wo Contrast (neuro Protocol)  Result Date: 01/13/2019 CLINICAL DATA:  RIGHT facial droop and RIGHT-sided gaze preference beginning 01/12/2019. History of dementia. History of prior stroke. EXAM: MRI HEAD WITHOUT CONTRAST MRA HEAD WITHOUT CONTRAST TECHNIQUE: Multiplanar, multiecho pulse sequences of the brain and surrounding structures were obtained without intravenous contrast. Angiographic images of the head were obtained using MRA technique without contrast. COMPARISON:  CT head 01/13/2019. MRA intracranial 01/24/2017. MRI brain 08/02/2014. FINDINGS: MRI HEAD FINDINGS Brain: Large area of restricted diffusion affects greater than 50% of the LEFT cerebellum, inferior and lateral hemisphere as well as inferior vermis and tonsil on the LEFT, also dorsolateral medulla, consistent with acute LEFT PICA/AICA infarct. No visible hemorrhage. No restricted diffusion elsewhere. No midline shift, or extra-axial fluid. Advanced atrophy. Extensive white matter disease, likely chronic microvascular ischemic change. Remote RIGHT MCA hemispheric infarct affecting temporal and parietal regions.  Additional RIGHT PCA hemispheric infarct affects the RIGHT occipital lobe with encephalomalacia and gliosis. Additional large RIGHT basal ganglia chronic infarct. Hydrocephalus ex vacuo. Scattered areas of chronic BILATERAL cerebellar infarction. Vascular: Reported separately. Skull and upper cervical spine: Normal marrow signal. Sinuses/Orbits: Negative. Other: None. MRA HEAD FINDINGS The examination suffers from severe patient motion resulting in misregistration. Patency of the internal carotid arteries is established, although flow-limiting stenoses could be overlooked, and are incompletely evaluated due to segmental loss of flow related enhancement. ICA termini widely patent. Suspected 50% stenosis RIGHT M1 MCA origin. Mild irregularity both MCA bifurcations. Moderately diseased LEFT A1 ACA with dominant RIGHT A1 ACA. Distal anterior cerebral arteries patent. Mild irregularity of the BILATERAL MCA M3 branches. Misregistration of the RIGHT vertebral, but patency is established. Misregistration of the proximal basilar which is also patent. No visible basilar stenosis aside from misregistration. No flow related enhancement of the LEFT vertebral, likely acutely occluded, given the observed pattern of infarction. This vessel was the previous dominant contributor to the posterior circulation. Severe P1-P2 PCA stenosis redemonstrated. Patency distal RIGHT P2 and P3 branches. Patent LEFT P1 PCA. Moderate to severe stenosis LEFT P2 PCA. No visible intracranial saccular aneurysm. On the previous examination, the LEFT vertebral is dominant, therefore it has occlusion represents a significant vascular insult. IMPRESSION: Large area of acute nonhemorrhagic infarction affects greater than 50% of the LEFT cerebellum as well as a small portion of the LEFT dorsolateral medulla. Vascular territories involved include LEFT PICA and LEFT AICA; this infarct appears to be related to an acute occlusion of the LEFT vertebral artery; it  is unclear if this is secondary to dissection or thrombosis. Advanced atrophy and extensive small vessel disease.  Remote small BILATERAL cerebellar infarcts as well as remote large RIGHT MCA territory infarct. Patient motion and misregistration precludes detailed further assessment of the intracranial circulation except to establish patency of the carotid, basilar, and RIGHT vertebral arteries. Electronically Signed   By: Staci Righter M.D.   On: 01/13/2019 15:12   US Carotid Bilateral (at [facility] And [facility] Only)  Result Date: 01/13/2019 CLINICAL DATA:  Acute left cerebellar infarct EXAM: BILATERAL CAROTID DUPLEX ULTRASOUND TECHNIQUE: Pearline Cables scale imaging, color Doppler and duplex ultrasound were performed of bilateral carotid and vertebral arteries in the neck. COMPARISON:  01/13/2019 MRI FINDINGS: Criteria: Quantification of carotid stenosis is based on velocity parameters that correlate the residual internal carotid diameter with NASCET-based stenosis levels, using the diameter of the distal internal carotid lumen as the denominator for stenosis measurement. The following velocity measurements were obtained: RIGHT ICA: 70/11 cm/sec CCA: 47/6 cm/sec SYSTOLIC ICA/CCA RATIO:  1.5 ECA: 48 cm/sec LEFT ICA: 71/16 cm/sec CCA: 54/65 cm/sec SYSTOLIC ICA/CCA RATIO:  1.5 ECA: 41 cm/sec RIGHT CAROTID ARTERY: Moderate heterogeneous partially calcified right carotid bifurcation atherosclerosis. This narrows the proximal ICA lumen. Despite this, there is no hemodynamically significant right ICA stenosis, velocity elevation, turbulent flow. Degree of narrowing still estimated less than 50%. RIGHT VERTEBRAL ARTERY: Antegrade, normal waveform with continuous flow LEFT CAROTID ARTERY: Similar moderate heterogeneous and calcified left carotid bifurcation atherosclerosis. Despite this, there is no hemodynamically significant left ICA stenosis, velocity elevation, turbulent flow. Degree of narrowing also less than 50%. LEFT VERTEBRAL  ARTERY: Antegrade but abnormal waveform with loss of diastolic continuous flow compatible with peripheral intracranial occlusion. IMPRESSION: Moderate bilateral carotid atherosclerosis. No hemodynamically significant ICA stenosis. Degree of narrowing less than 50% bilaterally. Normal patent right vertebral antegrade flow Antegrade but abnormal left vertebral artery waveform with loss of continuous diastolic flow suggesting peripheral intracranial occlusion. Electronically Signed   By: Jerilynn Mages.  Shick M.D.   On: 01/13/2019 15:54   Dg Chest Port 1 View  Result Date: 01/31/19 CLINICAL DATA:  Hypoxia EXAM: PORTABLE CHEST 1 VIEW COMPARISON:  01/13/2019 FINDINGS: Mild cardiomegaly, stable. Mitral annular calcification. There is no edema, consolidation, effusion, or pneumothorax. IMPRESSION: No evidence of active disease. Electronically Signed   By: Monte Fantasia M.D.   On: January 31, 2019 06:13   Dg Chest Port 1 View  Result Date: 01/13/2019 CLINICAL DATA:  Nausea and vomiting EXAM: PORTABLE CHEST 1 VIEW COMPARISON:  June 19, 2018 FINDINGS: There is no appreciable edema or consolidation. Heart is borderline enlarged with pulmonary vascularity normal. There is aortic atherosclerosis as well as mitral annular calcification. No adenopathy. There is degenerative change in each shoulder. IMPRESSION: Chronic cardiac prominence. No edema or consolidation. Aortic Atherosclerosis (ICD10-I70.0). Electronically Signed   By: Lowella Grip III M.D.   On: 01/13/2019 09:52   Mr Jodene Nam Head (cerebral Arteries)  Result Date: 01/13/2019 CLINICAL DATA:  RIGHT facial droop and RIGHT-sided gaze preference beginning 01/12/2019. History of dementia. History of prior stroke. EXAM: MRI HEAD WITHOUT CONTRAST MRA HEAD WITHOUT CONTRAST TECHNIQUE: Multiplanar, multiecho pulse sequences of the brain and surrounding structures were obtained without intravenous contrast. Angiographic images of the head were obtained using MRA technique without  contrast. COMPARISON:  CT head 01/13/2019. MRA intracranial 01/24/2017. MRI brain 08/02/2014. FINDINGS: MRI HEAD FINDINGS Brain: Large area of restricted diffusion affects greater than 50% of the LEFT cerebellum, inferior and lateral hemisphere as well as inferior vermis and tonsil on the LEFT, also dorsolateral medulla, consistent with acute LEFT PICA/AICA infarct. No visible hemorrhage. No restricted  diffusion elsewhere. No midline shift, or extra-axial fluid. Advanced atrophy. Extensive white matter disease, likely chronic microvascular ischemic change. Remote RIGHT MCA hemispheric infarct affecting temporal and parietal regions. Additional RIGHT PCA hemispheric infarct affects the RIGHT occipital lobe with encephalomalacia and gliosis. Additional large RIGHT basal ganglia chronic infarct. Hydrocephalus ex vacuo. Scattered areas of chronic BILATERAL cerebellar infarction. Vascular: Reported separately. Skull and upper cervical spine: Normal marrow signal. Sinuses/Orbits: Negative. Other: None. MRA HEAD FINDINGS The examination suffers from severe patient motion resulting in misregistration. Patency of the internal carotid arteries is established, although flow-limiting stenoses could be overlooked, and are incompletely evaluated due to segmental loss of flow related enhancement. ICA termini widely patent. Suspected 50% stenosis RIGHT M1 MCA origin. Mild irregularity both MCA bifurcations. Moderately diseased LEFT A1 ACA with dominant RIGHT A1 ACA. Distal anterior cerebral arteries patent. Mild irregularity of the BILATERAL MCA M3 branches. Misregistration of the RIGHT vertebral, but patency is established. Misregistration of the proximal basilar which is also patent. No visible basilar stenosis aside from misregistration. No flow related enhancement of the LEFT vertebral, likely acutely occluded, given the observed pattern of infarction. This vessel was the previous dominant contributor to the posterior  circulation. Severe P1-P2 PCA stenosis redemonstrated. Patency distal RIGHT P2 and P3 branches. Patent LEFT P1 PCA. Moderate to severe stenosis LEFT P2 PCA. No visible intracranial saccular aneurysm. On the previous examination, the LEFT vertebral is dominant, therefore it has occlusion represents a significant vascular insult. IMPRESSION: Large area of acute nonhemorrhagic infarction affects greater than 50% of the LEFT cerebellum as well as a small portion of the LEFT dorsolateral medulla. Vascular territories involved include LEFT PICA and LEFT AICA; this infarct appears to be related to an acute occlusion of the LEFT vertebral artery; it is unclear if this is secondary to dissection or thrombosis. Advanced atrophy and extensive small vessel disease. Remote small BILATERAL cerebellar infarcts as well as remote large RIGHT MCA territory infarct. Patient motion and misregistration precludes detailed further assessment of the intracranial circulation except to establish patency of the carotid, basilar, and RIGHT vertebral arteries. Electronically Signed   By: Staci Righter M.D.   On: 01/13/2019 15:12      The results of significant diagnostics from this hospitalization (including imaging, microbiology, ancillary and laboratory) are listed below for reference.     Microbiology: Recent Results (from the past 240 hour(s))  SARS Coronavirus 2 (CEPHEID - Performed in [facility] hospital lab), Hosp Order     Status: None   Collection Time: 01/13/19  9:07 AM  Result Value Ref Range Status   SARS Coronavirus 2 NEGATIVE NEGATIVE Final    Comment: (NOTE) If result is NEGATIVE SARS-CoV-2 target nucleic acids are NOT DETECTED. The SARS-CoV-2 RNA is generally detectable in upper and lower  respiratory specimens during the acute phase of infection. The lowest  concentration of SARS-CoV-2 viral copies this assay can detect is 250  copies / mL. A negative result does not preclude SARS-CoV-2 infection  and  should not be used as the sole basis for treatment or other  patient management decisions.  A negative result may occur with  improper specimen collection / handling, submission of specimen other  than nasopharyngeal swab, presence of viral mutation(s) within the  areas targeted by this assay, and inadequate number of viral copies  (<250 copies / mL). A negative result must be combined with clinical  observations, patient history, and epidemiological information. If result is POSITIVE SARS-CoV-2 target nucleic acids are DETECTED.  The SARS-CoV-2 RNA is generally detectable in upper and lower  respiratory specimens dur ing the acute phase of infection.  Positive  results are indicative of active infection with SARS-CoV-2.  Clinical  correlation with patient history and other diagnostic information is  necessary to determine patient infection status.  Positive results do  not rule out bacterial infection or co-infection with other viruses. If result is PRESUMPTIVE POSTIVE SARS-CoV-2 nucleic acids MAY BE PRESENT.   A presumptive positive result was obtained on the submitted specimen  and confirmed on repeat testing.  While 2019 novel coronavirus  (SARS-CoV-2) nucleic acids may be present in the submitted sample  additional confirmatory testing may be necessary for epidemiological  and / or clinical management purposes  to differentiate between  SARS-CoV-2 and other Sarbecovirus currently known to infect humans.  If clinically indicated additional testing with an alternate test  methodology [phone]) is advised. The SARS-CoV-2 RNA is generally  detectable in upper and lower respiratory sp ecimens during the acute  phase of infection. The expected result is Negative. Fact Sheet for Patients:  StrictlyIdeas.no Fact Sheet for Healthcare Providers: BankingDealers.co.za This test is not yet approved or cleared by the [place] FDA and has been  authorized for detection and/or diagnosis of SARS-CoV-2 by FDA under an Emergency Use Authorization (EUA).  This EUA will remain in effect (meaning this test can be used) for the duration of the COVID-19 declaration under Section 564(b)(1) of the Act, 21 U.S.C. section 360bbb-3(b)(1), unless the authorization is terminated or revoked sooner. Performed at [facility], [address], [place], [place] [address]   Culture, blood (routine x 2)     Status: None (Preliminary result)   Collection Time: 01/13/19 11:23 AM  Result Value Ref Range Status   Specimen Description BLOOD RIGHT ARM  Final   Special Requests   Final    BOTTLES DRAWN AEROBIC AND ANAEROBIC Blood Culture results may not be optimal due to an inadequate volume of blood received in culture bottles   Culture   Final    NO GROWTH < 24 HOURS Performed at [facility], [address], [place], [place] [address]    Report Status PENDING  Incomplete  Culture, blood (routine x 2)     Status: None (Preliminary result)   Collection Time: 01/13/19 11:24 AM  Result Value Ref Range Status   Specimen Description BLOOD RIGHT HAND  Final   Special Requests   Final    BOTTLES DRAWN AEROBIC ONLY Blood Culture results may not be optimal due to an inadequate volume of blood received in culture bottles   Culture   Final    NO GROWTH < 24 HOURS Performed at [facility], [address] [place], [place] [address]    Report Status PENDING  Incomplete  MRSA PCR Screening     Status: None   Collection Time: February 01, 2019 12:00 AM  Result Value Ref Range Status   MRSA by PCR NEGATIVE NEGATIVE Final    Comment:        The GeneXpert MRSA Assay (FDA approved for NASAL specimens only), is one component of a comprehensive MRSA colonization surveillance program. It is not intended to diagnose MRSA infection nor to guide or monitor treatment for MRSA infections. Performed at [facility], [address], [place], [place] [address]      Labs: BNP  (last 3 results) Recent Labs    05/08/18 1406  BNP 0,962.8*   Basic Metabolic Panel: Recent Labs  Lab 01/13/19 1019  NA 141  K 3.6  CL 107  CO2 25  GLUCOSE 174*  BUN 31*  CREATININE 1.55*  CALCIUM 9.3   Liver Function Tests: Recent Labs  Lab 01/13/19 1019  AST 20  ALT 15  ALKPHOS 55  BILITOT 0.4  PROT 6.2*  ALBUMIN 3.2*   Recent Labs  Lab 01/13/19 1019  LIPASE 31   No results for input(s): AMMONIA in the last 168 hours. CBC: Recent Labs  Lab 01/13/19 1019  WBC 5.6  NEUTROABS 3.5  HGB 10.2*  HCT 31.2*  MCV 96.9  PLT 145*   Cardiac Enzymes: Recent Labs  Lab 01/13/19 1019  TROPONINI 0.24*   BNP: Invalid input(s): POCBNP CBG: Recent Labs  Lab 01/13/19 1940 01/13/19 2105  GLUCAP 145* 128*   D-Dimer No results for input(s): DDIMER in the last 72 hours. Hgb A1c No results for input(s): HGBA1C in the last 72 hours. Lipid Profile No results for input(s): CHOL, HDL, LDLCALC, TRIG, CHOLHDL, LDLDIRECT in the last 72 hours. Thyroid function studies No results for input(s): TSH, T4TOTAL, T3FREE, THYROIDAB in the last 72 hours.  Invalid input(s): FREET3 Anemia work up No results for input(s): VITAMINB12, FOLATE, FERRITIN, TIBC, IRON, RETICCTPCT in the last 72 hours. Urinalysis    Component Value Date/Time   COLORURINE STRAW (A) 01/13/2019 0924   APPEARANCEUR CLEAR 01/13/2019 0924   LABSPEC 1.009 01/13/2019 0924   PHURINE 6.0 01/13/2019 0924   GLUCOSEU NEGATIVE 01/13/2019 0924   GLUCOSEU NEGATIVE 10/12/2014 1053   HGBUR NEGATIVE 01/13/2019 0924   BILIRUBINUR NEGATIVE 01/13/2019 [place] 01/13/2019 0924   PROTEINUR NEGATIVE 01/13/2019 0924   UROBILINOGEN 1.0 04/28/2015 1658   NITRITE NEGATIVE 01/13/2019 0924   LEUKOCYTESUR NEGATIVE 01/13/2019 0924   Sepsis Labs Invalid input(s): PROCALCITONIN,  WBC,  LACTICIDVEN Microbiology Recent Results (from the past 240 hour(s))  SARS Coronavirus 2 (CEPHEID - Performed in [facility]  hospital lab), Hosp Order     Status: None   Collection Time: 01/13/19  9:07 AM  Result Value Ref Range Status   SARS Coronavirus 2 NEGATIVE NEGATIVE Final    Comment: (NOTE) If result is NEGATIVE SARS-CoV-2 target nucleic acids are NOT DETECTED. The SARS-CoV-2 RNA is generally detectable in upper and lower  respiratory specimens during the acute phase of infection. The lowest  concentration of SARS-CoV-2 viral copies this assay can detect is 250  copies / mL. A negative result does not preclude SARS-CoV-2 infection  and should not be used as the sole basis for treatment or other  patient management decisions.  A negative result may occur with  improper specimen collection / handling, submission of specimen other  than nasopharyngeal swab, presence of viral mutation(s) within the  areas targeted by this assay, and inadequate number of viral copies  (<250 copies / mL). A negative result must be combined with clinical  observations, patient history, and epidemiological information. If result is POSITIVE SARS-CoV-2 target nucleic acids are DETECTED. The SARS-CoV-2 RNA is generally detectable in upper and lower  respiratory specimens dur ing the acute phase of infection.  Positive  results are indicative of active infection with SARS-CoV-2.  Clinical  correlation with patient history and other diagnostic information is  necessary to determine patient infection status.  Positive results do  not rule out bacterial infection or co-infection with other viruses. If result is PRESUMPTIVE POSTIVE SARS-CoV-2 nucleic acids MAY BE PRESENT.   A presumptive positive result was obtained on the submitted specimen  and confirmed on  repeat testing.  While 2019 novel coronavirus  (SARS-CoV-2) nucleic acids may be present in the submitted sample  additional confirmatory testing may be necessary for epidemiological  and / or clinical management purposes  to differentiate between  SARS-CoV-2 and other  Sarbecovirus currently known to infect humans.  If clinically indicated additional testing with an alternate test  methodology [phone]) is advised. The SARS-CoV-2 RNA is generally  detectable in upper and lower respiratory sp ecimens during the acute  phase of infection. The expected result is Negative. Fact Sheet for Patients:  StrictlyIdeas.no Fact Sheet for Healthcare Providers: BankingDealers.co.za This test is not yet approved or cleared by the [place] FDA and has been authorized for detection and/or diagnosis of SARS-CoV-2 by FDA under an Emergency Use Authorization (EUA).  This EUA will remain in effect (meaning this test can be used) for the duration of the COVID-19 declaration under Section 564(b)(1) of the Act, 21 U.S.C. section 360bbb-3(b)(1), unless the authorization is terminated or revoked sooner. Performed at [facility], [address], [place], [place] [address]   Culture, blood (routine x 2)     Status: None (Preliminary result)   Collection Time: 01/13/19 11:23 AM  Result Value Ref Range Status   Specimen Description BLOOD RIGHT ARM  Final   Special Requests   Final    BOTTLES DRAWN AEROBIC AND ANAEROBIC Blood Culture results may not be optimal due to an inadequate volume of blood received in culture bottles   Culture   Final    NO GROWTH < 24 HOURS Performed at [facility], [address] [place], [place] [address]    Report Status PENDING  Incomplete  Culture, blood (routine x 2)     Status: None (Preliminary result)   Collection Time: 01/13/19 11:24 AM  Result Value Ref Range Status   Specimen Description BLOOD RIGHT HAND  Final   Special Requests   Final    BOTTLES DRAWN AEROBIC ONLY Blood Culture results may not be optimal due to an inadequate volume of blood received in culture bottles   Culture   Final    NO GROWTH < 24 HOURS Performed at [facility], [address] [place], [place] [address]     Report Status PENDING  Incomplete  MRSA PCR Screening     Status: None   Collection Time: 2019/01/23 12:00 AM  Result Value Ref Range Status   MRSA by PCR NEGATIVE NEGATIVE Final    Comment:        The GeneXpert MRSA Assay (FDA approved for NASAL specimens only), is one component of a comprehensive MRSA colonization surveillance program. It is not intended to diagnose MRSA infection nor to guide or monitor treatment for MRSA infections. Performed at [facility], [address] [place], [place] [address]      Time coordinating discharge: 35 minutes  SIGNED:   Rodena Goldmann, DO [facility] 01/23/2019, 7:47 AM  If 7PM-7AM, please contact night-coverage [email or web address] Password [email or web address]

## 2019-01-25 NOTE — Progress Notes (Signed)
Night shift telemetry coverage note.  Patient was seen due to hypoxia with stridor.  Her O2 sat decreased to the mid 70s on NRB oxygen.  Prior to this, she had been maintaining an O2 sat in the high 90s on nasal cannula oxygen only.  She was obtunded, unable to provide further information and did not respond to tactile or verbal stimuli. Her vital signs of the moment were temperature 99 F, pulse 106, respirations 15 and blood pressure 150/ 103 mmHg.  Cardiovascular, S1-S2, RRR.  Lungs decreased breath sounds on bases, particularly on the left with bilateral rhonchi more audible on the left side as well.  After examining the patient and repositioning her, her hypoxia is spontaneously resolved.  We started tapering off the NRB oxygen from 15 LPN to most recently 8 LPM.  We try to contact the patient's guardian, but were unable to obtain a response.  She is a DNR and has probably aspirated.  Assessment: Hypoxia Continue supplemental oxygen. Probably would not do well on BiPAP given her current mental state and aspiration risk. She has a scopolamine patch to help with secretions.  I will obtain portable chest radiograph.  At this point, given her clinical status and medical condition goal is to keep the patient comfortable.  We will try to contact guardian again later in the morning during business hours.  Tennis Must, MD.  About 40 minutes were spent during the process of this critical care event.  This document was prepared using Dragon voice recognition software and may contain some unintended transcription errors.

## 2019-01-25 NOTE — Progress Notes (Signed)
Patient's RN called at [phone] to update this AC in regards to patient condition. Informed this RN that patient was under DSS custody with [facility] and was deteriorating. RN asking to speak with patient's family. Stated she attempted to call DSS several times prior to calling this RN and got a voicemail. This RN was able to obtain a direct line contact with on call DSS worker at [phone]. Received permission to update family by DSS worker, Alesia Morin, at [phone] and allow family to see patient. This RN notified primary RN at that time. Patient passed [phone]

## 2019-01-25 NOTE — Progress Notes (Signed)
Was called to the patients room by the sitter.  Notified that the patients breathing had changed her oxygen saturation had decreased and patient was not responding.  Called respiratory to the room and the patient was placed on non-rebreather.  MD on-call was called to come and look at the patient.  After MD examined the patient oxygen saturations and vitals stabilized.  Order for chest Xray obtained.  Tried calling patients contacts to update on condition with no success.  Ten minutes later was contacted be the patients grandson.  Yolanda Bonine was given an update.  Patient currently still unresponsive vitals are stable.  Will continue to monitor.

## 2019-01-25 NOTE — Progress Notes (Signed)
[phone]-message left for legal guardian Marinda Elk for permission to release body to [place] of family's choice.  [phone]-message also left for DSS supervisor, Ginger.

## 2019-01-25 NOTE — Progress Notes (Signed)
Patient stopped breathing and became asystole.  Notified AC whom contacted DSS.  Notified MD.  Contacted [facility].  Tried multiple times to contact family with no success. Will pass to the oncoming shift to continue to try to contact the family.

## 2019-01-25 DEATH — deceased
